# Patient Record
Sex: Male | Born: 1935
Health system: Southern US, Community
[De-identification: ages and names within clinical notes are randomized; demographics above are authoritative.]

## PROBLEM LIST (undated history)

## (undated) DIAGNOSIS — I6389 Other cerebral infarction: Secondary | ICD-10-CM

## (undated) DIAGNOSIS — F419 Anxiety disorder, unspecified: Secondary | ICD-10-CM

## (undated) DIAGNOSIS — I509 Heart failure, unspecified: Secondary | ICD-10-CM

## (undated) DIAGNOSIS — Z9289 Personal history of other medical treatment: Secondary | ICD-10-CM

## (undated) DIAGNOSIS — Z8719 Personal history of other diseases of the digestive system: Secondary | ICD-10-CM

## (undated) DIAGNOSIS — K5790 Diverticulosis of intestine, part unspecified, without perforation or abscess without bleeding: Secondary | ICD-10-CM

## (undated) DIAGNOSIS — K219 Gastro-esophageal reflux disease without esophagitis: Secondary | ICD-10-CM

## (undated) DIAGNOSIS — R001 Bradycardia, unspecified: Secondary | ICD-10-CM

## (undated) DIAGNOSIS — I619 Nontraumatic intracerebral hemorrhage, unspecified: Secondary | ICD-10-CM

## (undated) DIAGNOSIS — I2510 Atherosclerotic heart disease of native coronary artery without angina pectoris: Secondary | ICD-10-CM

## (undated) DIAGNOSIS — I219 Acute myocardial infarction, unspecified: Secondary | ICD-10-CM

## (undated) DIAGNOSIS — I639 Cerebral infarction, unspecified: Secondary | ICD-10-CM

## (undated) DIAGNOSIS — M1990 Unspecified osteoarthritis, unspecified site: Secondary | ICD-10-CM

## (undated) DIAGNOSIS — I4891 Unspecified atrial fibrillation: Secondary | ICD-10-CM

## (undated) DIAGNOSIS — F329 Major depressive disorder, single episode, unspecified: Secondary | ICD-10-CM

## (undated) DIAGNOSIS — F32A Depression, unspecified: Secondary | ICD-10-CM

## (undated) DIAGNOSIS — K449 Diaphragmatic hernia without obstruction or gangrene: Secondary | ICD-10-CM

## (undated) DIAGNOSIS — E785 Hyperlipidemia, unspecified: Secondary | ICD-10-CM

## (undated) DIAGNOSIS — I10 Essential (primary) hypertension: Secondary | ICD-10-CM

## (undated) HISTORY — DX: Hyperlipidemia, unspecified: E78.5

## (undated) HISTORY — PX: CATARACT EXTRACTION W/ INTRAOCULAR LENS  IMPLANT, BILATERAL: SHX1307

## (undated) HISTORY — DX: Anxiety disorder, unspecified: F41.9

## (undated) HISTORY — DX: Diverticulosis of intestine, part unspecified, without perforation or abscess without bleeding: K57.90

## (undated) HISTORY — DX: Personal history of other diseases of the digestive system: Z87.19

## (undated) HISTORY — DX: Diaphragmatic hernia without obstruction or gangrene: K44.9

## (undated) HISTORY — PX: TOTAL KNEE ARTHROPLASTY: SHX125

## (undated) HISTORY — DX: Acute myocardial infarction, unspecified: I21.9

## (undated) HISTORY — DX: Unspecified atrial fibrillation: I48.91

## (undated) HISTORY — DX: Essential (primary) hypertension: I10

## (undated) HISTORY — PX: CARDIAC CATHETERIZATION: SHX172

## (undated) HISTORY — PX: INGUINAL HERNIA REPAIR: SUR1180

## (undated) HISTORY — DX: Atherosclerotic heart disease of native coronary artery without angina pectoris: I25.10

## (undated) HISTORY — DX: Gastro-esophageal reflux disease without esophagitis: K21.9

## (undated) HISTORY — PX: TONSILLECTOMY: SUR1361

## (undated) HISTORY — DX: Cerebral infarction, unspecified: I63.9

## (undated) HISTORY — DX: Bradycardia, unspecified: R00.1

---

## 1970-02-08 HISTORY — PX: NASAL SEPTUM SURGERY: SHX37

## 1982-02-08 DIAGNOSIS — I219 Acute myocardial infarction, unspecified: Secondary | ICD-10-CM

## 1982-02-08 HISTORY — DX: Acute myocardial infarction, unspecified: I21.9

## 1982-02-08 HISTORY — PX: OTHER SURGICAL HISTORY: SHX169

## 1986-02-08 DIAGNOSIS — Z9289 Personal history of other medical treatment: Secondary | ICD-10-CM

## 1986-02-08 HISTORY — PX: VAGOTOMY AND PYLOROPLASTY: SHX831

## 1986-02-08 HISTORY — DX: Personal history of other medical treatment: Z92.89

## 1986-10-15 DIAGNOSIS — Z8719 Personal history of other diseases of the digestive system: Secondary | ICD-10-CM

## 1986-10-15 HISTORY — DX: Personal history of other diseases of the digestive system: Z87.19

## 1987-02-09 HISTORY — PX: CHOLECYSTECTOMY OPEN: SUR202

## 1995-03-16 DIAGNOSIS — K5790 Diverticulosis of intestine, part unspecified, without perforation or abscess without bleeding: Secondary | ICD-10-CM

## 1995-03-16 HISTORY — DX: Diverticulosis of intestine, part unspecified, without perforation or abscess without bleeding: K57.90

## 2000-09-20 ENCOUNTER — Ambulatory Visit (HOSPITAL_COMMUNITY): Admission: RE | Admit: 2000-09-20 | Discharge: 2000-09-20 | Payer: Self-pay | Admitting: *Deleted

## 2000-09-20 ENCOUNTER — Encounter: Payer: Self-pay | Admitting: *Deleted

## 2000-12-30 DIAGNOSIS — K449 Diaphragmatic hernia without obstruction or gangrene: Secondary | ICD-10-CM

## 2000-12-30 DIAGNOSIS — K219 Gastro-esophageal reflux disease without esophagitis: Secondary | ICD-10-CM

## 2000-12-30 HISTORY — DX: Gastro-esophageal reflux disease without esophagitis: K21.9

## 2000-12-30 HISTORY — DX: Diaphragmatic hernia without obstruction or gangrene: K44.9

## 2002-03-26 ENCOUNTER — Encounter: Payer: Self-pay | Admitting: Orthopedic Surgery

## 2002-04-02 ENCOUNTER — Inpatient Hospital Stay (HOSPITAL_COMMUNITY): Admission: RE | Admit: 2002-04-02 | Discharge: 2002-04-05 | Payer: Self-pay | Admitting: Orthopedic Surgery

## 2002-04-05 ENCOUNTER — Encounter: Payer: Self-pay | Admitting: *Deleted

## 2002-04-05 ENCOUNTER — Inpatient Hospital Stay (HOSPITAL_COMMUNITY): Admission: EM | Admit: 2002-04-05 | Discharge: 2002-04-06 | Payer: Self-pay | Admitting: Emergency Medicine

## 2002-04-29 ENCOUNTER — Observation Stay (HOSPITAL_COMMUNITY): Admission: EM | Admit: 2002-04-29 | Discharge: 2002-04-30 | Payer: Self-pay | Admitting: Emergency Medicine

## 2003-12-24 ENCOUNTER — Ambulatory Visit: Payer: Self-pay | Admitting: Cardiology

## 2004-01-14 ENCOUNTER — Ambulatory Visit: Payer: Self-pay | Admitting: Cardiology

## 2004-02-11 ENCOUNTER — Ambulatory Visit: Payer: Self-pay | Admitting: *Deleted

## 2004-02-27 ENCOUNTER — Ambulatory Visit: Payer: Self-pay | Admitting: *Deleted

## 2004-03-19 ENCOUNTER — Ambulatory Visit: Payer: Self-pay | Admitting: Internal Medicine

## 2004-04-08 ENCOUNTER — Ambulatory Visit: Payer: Self-pay

## 2004-04-16 ENCOUNTER — Ambulatory Visit: Payer: Self-pay | Admitting: *Deleted

## 2004-04-23 ENCOUNTER — Ambulatory Visit: Payer: Self-pay | Admitting: Family Medicine

## 2004-05-14 ENCOUNTER — Ambulatory Visit: Payer: Self-pay | Admitting: Internal Medicine

## 2004-06-11 ENCOUNTER — Ambulatory Visit: Payer: Self-pay | Admitting: Internal Medicine

## 2004-07-09 ENCOUNTER — Ambulatory Visit: Payer: Self-pay | Admitting: Cardiology

## 2004-08-06 ENCOUNTER — Ambulatory Visit: Payer: Self-pay | Admitting: Cardiology

## 2004-09-03 ENCOUNTER — Ambulatory Visit: Payer: Self-pay | Admitting: Cardiology

## 2004-09-14 ENCOUNTER — Ambulatory Visit: Payer: Self-pay | Admitting: Cardiology

## 2004-10-05 ENCOUNTER — Ambulatory Visit: Payer: Self-pay | Admitting: Cardiology

## 2004-11-02 ENCOUNTER — Ambulatory Visit: Payer: Self-pay | Admitting: Cardiology

## 2004-11-30 ENCOUNTER — Ambulatory Visit: Payer: Self-pay | Admitting: Internal Medicine

## 2004-12-28 ENCOUNTER — Ambulatory Visit: Payer: Self-pay | Admitting: Cardiology

## 2005-01-25 ENCOUNTER — Ambulatory Visit: Payer: Self-pay | Admitting: Internal Medicine

## 2005-02-15 ENCOUNTER — Ambulatory Visit: Payer: Self-pay | Admitting: Cardiology

## 2005-03-18 ENCOUNTER — Ambulatory Visit: Payer: Self-pay | Admitting: *Deleted

## 2005-04-13 ENCOUNTER — Ambulatory Visit: Payer: Self-pay | Admitting: Cardiology

## 2005-05-18 ENCOUNTER — Ambulatory Visit: Payer: Self-pay | Admitting: Cardiology

## 2005-05-21 ENCOUNTER — Ambulatory Visit: Payer: Self-pay | Admitting: Family Medicine

## 2005-06-01 ENCOUNTER — Ambulatory Visit: Payer: Self-pay | Admitting: Cardiology

## 2005-06-25 ENCOUNTER — Ambulatory Visit: Payer: Self-pay | Admitting: Internal Medicine

## 2005-06-29 ENCOUNTER — Ambulatory Visit: Payer: Self-pay | Admitting: *Deleted

## 2005-07-22 ENCOUNTER — Ambulatory Visit: Payer: Self-pay | Admitting: Cardiology

## 2005-08-12 ENCOUNTER — Ambulatory Visit: Payer: Self-pay | Admitting: Internal Medicine

## 2005-08-24 ENCOUNTER — Ambulatory Visit: Payer: Self-pay | Admitting: Cardiology

## 2005-09-14 ENCOUNTER — Ambulatory Visit: Payer: Self-pay | Admitting: Cardiology

## 2005-09-20 ENCOUNTER — Encounter: Admission: RE | Admit: 2005-09-20 | Discharge: 2005-09-20 | Payer: Self-pay | Admitting: Otolaryngology

## 2005-10-01 ENCOUNTER — Ambulatory Visit: Payer: Self-pay | Admitting: Cardiovascular Disease

## 2005-10-15 ENCOUNTER — Ambulatory Visit: Payer: Self-pay | Admitting: Internal Medicine

## 2005-11-02 ENCOUNTER — Ambulatory Visit: Payer: Self-pay | Admitting: Cardiovascular Disease

## 2005-11-29 ENCOUNTER — Ambulatory Visit: Payer: Self-pay | Admitting: Family Medicine

## 2005-11-30 ENCOUNTER — Ambulatory Visit: Payer: Self-pay | Admitting: Cardiology

## 2005-12-02 ENCOUNTER — Ambulatory Visit: Payer: Self-pay | Admitting: Internal Medicine

## 2005-12-28 ENCOUNTER — Ambulatory Visit: Payer: Self-pay | Admitting: Internal Medicine

## 2006-01-13 ENCOUNTER — Ambulatory Visit: Payer: Self-pay | Admitting: Internal Medicine

## 2006-01-25 ENCOUNTER — Ambulatory Visit: Payer: Self-pay | Admitting: Cardiology

## 2006-02-15 ENCOUNTER — Ambulatory Visit: Payer: Self-pay | Admitting: Cardiology

## 2006-03-17 ENCOUNTER — Ambulatory Visit: Payer: Self-pay | Admitting: Cardiology

## 2006-04-07 ENCOUNTER — Ambulatory Visit: Payer: Self-pay | Admitting: Cardiology

## 2006-04-21 ENCOUNTER — Ambulatory Visit: Payer: Self-pay | Admitting: Cardiology

## 2006-05-05 ENCOUNTER — Ambulatory Visit: Payer: Self-pay | Admitting: Cardiology

## 2006-06-01 ENCOUNTER — Ambulatory Visit: Payer: Self-pay | Admitting: Family Medicine

## 2006-06-01 LAB — CONVERTED CEMR LAB
ALT: 24 units/L (ref 0–40)
AST: 22 units/L (ref 0–37)
Albumin: 3.7 g/dL (ref 3.5–5.2)
Alkaline Phosphatase: 64 units/L (ref 39–117)
BUN: 18 mg/dL (ref 6–23)
Basophils Absolute: 0 10*3/uL (ref 0.0–0.1)
Basophils Relative: 0.1 % (ref 0.0–1.0)
Bilirubin, Direct: 0.1 mg/dL (ref 0.0–0.3)
CO2: 32 meq/L (ref 19–32)
Calcium: 9.3 mg/dL (ref 8.4–10.5)
Chloride: 108 meq/L (ref 96–112)
Cholesterol: 118 mg/dL (ref 0–200)
Creatinine, Ser: 1.1 mg/dL (ref 0.4–1.5)
Eosinophils Absolute: 0.4 10*3/uL (ref 0.0–0.6)
Eosinophils Relative: 5.7 % — ABNORMAL HIGH (ref 0.0–5.0)
GFR calc Af Amer: 84 mL/min
GFR calc non Af Amer: 70 mL/min
Glucose, Bld: 111 mg/dL — ABNORMAL HIGH (ref 70–99)
HCT: 43.7 % (ref 39.0–52.0)
HDL: 36.3 mg/dL — ABNORMAL LOW (ref >39.0)
Hemoglobin: 15 g/dL (ref 13.0–17.0)
Hgb A1c MFr Bld: 6.4 % — ABNORMAL HIGH (ref 4.6–6.0)
LDL Cholesterol: 67 mg/dL (ref 0–99)
Lymphocytes Relative: 15.4 % (ref 12.0–46.0)
MCHC: 34.3 g/dL (ref 30.0–36.0)
MCV: 89.7 fL (ref 78.0–100.0)
Monocytes Absolute: 0.6 10*3/uL (ref 0.2–0.7)
Monocytes Relative: 8.1 % (ref 3.0–11.0)
Neutro Abs: 5.3 10*3/uL (ref 1.4–7.7)
Neutrophils Relative %: 70.7 % (ref 43.0–77.0)
PSA: 4.82 ng/mL — ABNORMAL HIGH (ref 0.10–4.00)
Platelets: 222 10*3/uL (ref 150–400)
Potassium: 4.8 meq/L (ref 3.5–5.1)
RBC: 4.87 M/uL (ref 4.22–5.81)
RDW: 12.5 % (ref 11.5–14.6)
Sodium: 144 meq/L (ref 135–145)
TSH: 2.47 microintl units/mL (ref 0.35–5.50)
Total Bilirubin: 0.7 mg/dL (ref 0.3–1.2)
Total CHOL/HDL Ratio: 3.3
Total Protein: 6.8 g/dL (ref 6.0–8.3)
Triglycerides: 76 mg/dL (ref 0–149)
VLDL: 15 mg/dL (ref 0–40)
WBC: 7.4 10*3/uL (ref 4.5–10.5)

## 2006-06-02 ENCOUNTER — Ambulatory Visit: Payer: Self-pay | Admitting: Cardiology

## 2006-06-15 ENCOUNTER — Ambulatory Visit: Payer: Self-pay | Admitting: *Deleted

## 2006-06-17 ENCOUNTER — Ambulatory Visit: Payer: Self-pay | Admitting: Internal Medicine

## 2006-07-05 ENCOUNTER — Ambulatory Visit: Payer: Self-pay | Admitting: Cardiology

## 2006-07-12 ENCOUNTER — Ambulatory Visit: Payer: Self-pay | Admitting: Internal Medicine

## 2006-08-03 ENCOUNTER — Ambulatory Visit: Payer: Self-pay | Admitting: Internal Medicine

## 2006-08-29 ENCOUNTER — Ambulatory Visit: Payer: Self-pay | Admitting: Cardiovascular Disease

## 2006-09-29 ENCOUNTER — Ambulatory Visit: Payer: Self-pay | Admitting: Cardiology

## 2006-10-17 ENCOUNTER — Ambulatory Visit: Payer: Self-pay | Admitting: Internal Medicine

## 2006-10-27 ENCOUNTER — Ambulatory Visit: Payer: Self-pay | Admitting: Internal Medicine

## 2006-11-17 ENCOUNTER — Ambulatory Visit: Payer: Self-pay | Admitting: Cardiology

## 2006-12-15 ENCOUNTER — Ambulatory Visit: Payer: Self-pay | Admitting: Cardiology

## 2007-01-12 ENCOUNTER — Ambulatory Visit: Payer: Self-pay | Admitting: Cardiology

## 2007-02-08 ENCOUNTER — Ambulatory Visit: Payer: Self-pay | Admitting: Cardiovascular Disease

## 2007-03-07 ENCOUNTER — Ambulatory Visit: Payer: Self-pay | Admitting: Cardiology

## 2007-04-05 ENCOUNTER — Ambulatory Visit: Payer: Self-pay | Admitting: Cardiology

## 2007-04-17 ENCOUNTER — Ambulatory Visit: Payer: Self-pay | Admitting: Internal Medicine

## 2007-05-03 ENCOUNTER — Ambulatory Visit: Payer: Self-pay | Admitting: Cardiology

## 2007-05-22 ENCOUNTER — Ambulatory Visit: Payer: Self-pay | Admitting: Internal Medicine

## 2007-06-20 ENCOUNTER — Ambulatory Visit: Payer: Self-pay | Admitting: Family Medicine

## 2007-06-20 DIAGNOSIS — E785 Hyperlipidemia, unspecified: Secondary | ICD-10-CM | POA: Insufficient documentation

## 2007-06-20 DIAGNOSIS — J4530 Mild persistent asthma, uncomplicated: Secondary | ICD-10-CM

## 2007-06-20 DIAGNOSIS — J309 Allergic rhinitis, unspecified: Secondary | ICD-10-CM

## 2007-06-20 DIAGNOSIS — K219 Gastro-esophageal reflux disease without esophagitis: Secondary | ICD-10-CM

## 2007-06-20 DIAGNOSIS — F411 Generalized anxiety disorder: Secondary | ICD-10-CM

## 2007-06-20 DIAGNOSIS — I10 Essential (primary) hypertension: Secondary | ICD-10-CM | POA: Insufficient documentation

## 2007-06-20 LAB — CONVERTED CEMR LAB
ALT: 27 units/L (ref 0–53)
AST: 20 units/L (ref 0–37)
Albumin: 3.6 g/dL (ref 3.5–5.2)
Alkaline Phosphatase: 53 units/L (ref 39–117)
BUN: 13 mg/dL (ref 6–23)
Basophils Absolute: 0 10*3/uL (ref 0.0–0.1)
Basophils Relative: 0.1 % (ref 0.0–1.0)
Bilirubin Urine: NEGATIVE
Bilirubin, Direct: 0.1 mg/dL (ref 0.0–0.3)
Blood in Urine, dipstick: NEGATIVE
CO2: 31 meq/L (ref 19–32)
Calcium: 9 mg/dL (ref 8.4–10.5)
Chloride: 106 meq/L (ref 96–112)
Cholesterol: 104 mg/dL (ref 0–200)
Creatinine, Ser: 1.1 mg/dL (ref 0.4–1.5)
Digitoxin Lvl: 0.2 ng/mL — ABNORMAL LOW (ref 0.8–2.0)
Eosinophils Absolute: 0.6 10*3/uL (ref 0.0–0.7)
Eosinophils Relative: 7.3 % — ABNORMAL HIGH (ref 0.0–5.0)
GFR calc Af Amer: 84 mL/min
GFR calc non Af Amer: 69 mL/min
Glucose, Bld: 105 mg/dL — ABNORMAL HIGH (ref 70–99)
Glucose, Urine, Semiquant: NEGATIVE
HCT: 44 % (ref 39.0–52.0)
HDL: 25.1 mg/dL — ABNORMAL LOW (ref >39.0)
Hemoglobin: 14.7 g/dL (ref 13.0–17.0)
Hgb A1c MFr Bld: 6.2 % — ABNORMAL HIGH (ref 4.6–6.0)
LDL Cholesterol: 60 mg/dL (ref 0–99)
Lymphocytes Relative: 16.6 % (ref 12.0–46.0)
MCHC: 33.4 g/dL (ref 30.0–36.0)
MCV: 92.9 fL (ref 78.0–100.0)
Monocytes Absolute: 0.6 10*3/uL (ref 0.1–1.0)
Monocytes Relative: 8.3 % (ref 3.0–12.0)
Neutro Abs: 5.1 10*3/uL (ref 1.4–7.7)
Neutrophils Relative %: 67.7 % (ref 43.0–77.0)
Nitrite: NEGATIVE
PSA: 4.77 ng/mL — ABNORMAL HIGH (ref 0.10–4.00)
Platelets: 189 10*3/uL (ref 150–400)
Potassium: 4.2 meq/L (ref 3.5–5.1)
RBC: 4.73 M/uL (ref 4.22–5.81)
RDW: 12.8 % (ref 11.5–14.6)
Sodium: 140 meq/L (ref 135–145)
Specific Gravity, Urine: 1.015
TSH: 3.44 microintl units/mL (ref 0.35–5.50)
Total Bilirubin: 1 mg/dL (ref 0.3–1.2)
Total CHOL/HDL Ratio: 4.1
Total Protein: 6.6 g/dL (ref 6.0–8.3)
Triglycerides: 96 mg/dL (ref 0–149)
Urobilinogen, UA: 0.2
VLDL: 19 mg/dL (ref 0–40)
WBC Urine, dipstick: NEGATIVE
WBC: 7.6 10*3/uL (ref 4.5–10.5)
pH: 5.5

## 2007-06-21 ENCOUNTER — Ambulatory Visit: Payer: Self-pay | Admitting: Internal Medicine

## 2007-07-19 ENCOUNTER — Ambulatory Visit: Payer: Self-pay | Admitting: Cardiology

## 2007-07-24 ENCOUNTER — Ambulatory Visit: Payer: Self-pay | Admitting: Pulmonary Disease

## 2007-08-16 ENCOUNTER — Ambulatory Visit: Payer: Self-pay | Admitting: Cardiovascular Disease

## 2007-09-15 ENCOUNTER — Ambulatory Visit: Payer: Self-pay | Admitting: Cardiology

## 2007-09-20 ENCOUNTER — Ambulatory Visit: Payer: Self-pay | Admitting: Cardiology

## 2007-10-12 ENCOUNTER — Ambulatory Visit: Payer: Self-pay | Admitting: Cardiology

## 2007-11-15 ENCOUNTER — Ambulatory Visit: Payer: Self-pay | Admitting: Cardiology

## 2007-12-13 ENCOUNTER — Ambulatory Visit: Payer: Self-pay | Admitting: Cardiology

## 2008-01-10 ENCOUNTER — Ambulatory Visit: Payer: Self-pay | Admitting: Cardiology

## 2008-02-07 ENCOUNTER — Ambulatory Visit: Payer: Self-pay | Admitting: Cardiology

## 2008-02-09 HISTORY — PX: CORONARY ANGIOPLASTY WITH STENT PLACEMENT: SHX49

## 2008-03-06 ENCOUNTER — Ambulatory Visit: Payer: Self-pay | Admitting: Cardiovascular Disease

## 2008-04-03 ENCOUNTER — Ambulatory Visit: Payer: Self-pay | Admitting: Cardiology

## 2008-04-22 ENCOUNTER — Ambulatory Visit: Payer: Self-pay | Admitting: Internal Medicine

## 2008-04-22 ENCOUNTER — Encounter: Payer: Self-pay | Admitting: Internal Medicine

## 2008-05-01 ENCOUNTER — Ambulatory Visit: Payer: Self-pay | Admitting: Cardiology

## 2008-05-22 ENCOUNTER — Ambulatory Visit: Payer: Self-pay | Admitting: Cardiovascular Disease

## 2008-06-19 ENCOUNTER — Ambulatory Visit: Payer: Self-pay | Admitting: Cardiology

## 2008-06-21 ENCOUNTER — Ambulatory Visit: Payer: Self-pay | Admitting: Family Medicine

## 2008-06-21 LAB — CONVERTED CEMR LAB
ALT: 30 U/L
AST: 28 U/L
Albumin: 3.9 g/dL
Alkaline Phosphatase: 54 U/L
BUN: 15 mg/dL
Basophils Absolute: 0 K/uL
Basophils Relative: 0.1 %
Bilirubin Urine: NEGATIVE
Bilirubin, Direct: 0.1 mg/dL
CO2: 32 meq/L
Calcium: 9.3 mg/dL
Chloride: 108 meq/L
Cholesterol: 106 mg/dL
Creatinine, Ser: 1.1 mg/dL
Digitoxin Lvl: 0.3 ng/mL — ABNORMAL LOW
Eosinophils Absolute: 0.3 K/uL
Eosinophils Relative: 4.9 %
GFR calc non Af Amer: 68.99 mL/min
Glucose, Bld: 103 mg/dL — ABNORMAL HIGH
Glucose, Urine, Semiquant: NEGATIVE
HCT: 41.5 %
HDL: 31.3 mg/dL — ABNORMAL LOW
Hemoglobin: 14.2 g/dL
Ketones, urine, test strip: NEGATIVE
LDL Cholesterol: 61 mg/dL
Lymphocytes Relative: 17.4 %
Lymphs Abs: 1 K/uL
MCHC: 34.2 g/dL
MCV: 91.2 fL
Monocytes Absolute: 0.5 K/uL
Monocytes Relative: 8.8 %
Neutro Abs: 4 K/uL
Neutrophils Relative %: 68.8 %
Nitrite: NEGATIVE
PSA: 4.96 ng/mL — ABNORMAL HIGH
Platelets: 187 K/uL
Potassium: 4.8 meq/L
Protein, U semiquant: NEGATIVE
RBC: 4.55 M/uL
RDW: 12.7 %
Sodium: 141 meq/L
Specific Gravity, Urine: <1.005
TSH: 2.44 u[IU]/mL
Total Bilirubin: 0.8 mg/dL
Total CHOL/HDL Ratio: 3
Total Protein: 6.7 g/dL
Triglycerides: 69 mg/dL
Urobilinogen, UA: 0.2
VLDL: 13.8 mg/dL
WBC Urine, dipstick: NEGATIVE
WBC: 5.8 10*3/microliter
pH: 6

## 2008-07-09 ENCOUNTER — Encounter: Payer: Self-pay | Admitting: *Deleted

## 2008-07-17 ENCOUNTER — Ambulatory Visit: Payer: Self-pay | Admitting: Internal Medicine

## 2008-07-17 LAB — CONVERTED CEMR LAB
POC INR: 2
Protime: 17.6

## 2008-08-14 ENCOUNTER — Encounter: Payer: Self-pay | Admitting: *Deleted

## 2008-08-19 ENCOUNTER — Encounter (INDEPENDENT_AMBULATORY_CARE_PROVIDER_SITE_OTHER): Payer: Self-pay | Admitting: Cardiology

## 2008-08-19 ENCOUNTER — Ambulatory Visit: Payer: Self-pay | Admitting: Cardiovascular Disease

## 2008-08-19 LAB — CONVERTED CEMR LAB: Prothrombin Time: 18.1 s

## 2008-09-16 ENCOUNTER — Ambulatory Visit: Payer: Self-pay | Admitting: Internal Medicine

## 2008-09-16 LAB — CONVERTED CEMR LAB: Prothrombin Time: 23.6 s

## 2008-09-30 ENCOUNTER — Ambulatory Visit: Payer: Self-pay | Admitting: Cardiology

## 2008-10-21 ENCOUNTER — Ambulatory Visit: Payer: Self-pay | Admitting: Internal Medicine

## 2008-11-14 ENCOUNTER — Encounter: Payer: Self-pay | Admitting: Family Medicine

## 2008-11-18 ENCOUNTER — Ambulatory Visit: Payer: Self-pay | Admitting: Internal Medicine

## 2008-11-18 LAB — CONVERTED CEMR LAB: POC INR: 2.8

## 2008-12-12 ENCOUNTER — Encounter: Payer: Self-pay | Admitting: Cardiology

## 2008-12-12 ENCOUNTER — Telehealth (INDEPENDENT_AMBULATORY_CARE_PROVIDER_SITE_OTHER): Payer: Self-pay

## 2008-12-16 ENCOUNTER — Ambulatory Visit: Payer: Self-pay

## 2008-12-16 ENCOUNTER — Ambulatory Visit: Payer: Self-pay | Admitting: Cardiology

## 2008-12-16 ENCOUNTER — Encounter (HOSPITAL_COMMUNITY): Admission: RE | Admit: 2008-12-16 | Discharge: 2009-02-05 | Payer: Self-pay | Admitting: Cardiology

## 2008-12-16 DIAGNOSIS — I2510 Atherosclerotic heart disease of native coronary artery without angina pectoris: Secondary | ICD-10-CM

## 2008-12-16 DIAGNOSIS — Z9861 Coronary angioplasty status: Secondary | ICD-10-CM

## 2008-12-16 DIAGNOSIS — R072 Precordial pain: Secondary | ICD-10-CM

## 2008-12-16 LAB — CONVERTED CEMR LAB: POC INR: 2

## 2008-12-17 ENCOUNTER — Ambulatory Visit: Payer: Self-pay | Admitting: Cardiology

## 2008-12-17 ENCOUNTER — Encounter (INDEPENDENT_AMBULATORY_CARE_PROVIDER_SITE_OTHER): Payer: Self-pay | Admitting: *Deleted

## 2008-12-19 ENCOUNTER — Encounter: Payer: Self-pay | Admitting: Cardiology

## 2008-12-23 ENCOUNTER — Ambulatory Visit: Payer: Self-pay | Admitting: Cardiology

## 2008-12-23 LAB — CONVERTED CEMR LAB
Basophils Relative: 0.1 % (ref 0.0–3.0)
CO2: 31 meq/L (ref 19–32)
Chloride: 104 meq/L (ref 96–112)
Creatinine, Ser: 1.1 mg/dL (ref 0.4–1.5)
Eosinophils Absolute: 0.4 10*3/uL (ref 0.0–0.7)
Eosinophils Relative: 4 % (ref 0.0–5.0)
HCT: 40.7 % (ref 39.0–52.0)
Hemoglobin: 14 g/dL (ref 13.0–17.0)
Lymphs Abs: 1.1 10*3/uL (ref 0.7–4.0)
MCHC: 34.4 g/dL (ref 30.0–36.0)
MCV: 94.6 fL (ref 78.0–100.0)
Monocytes Absolute: 0.9 10*3/uL (ref 0.1–1.0)
Neutro Abs: 7.6 10*3/uL (ref 1.4–7.7)
Potassium: 5 meq/L (ref 3.5–5.1)
RBC: 4.3 M/uL (ref 4.22–5.81)
Sodium: 140 meq/L (ref 135–145)
WBC: 10 10*3/uL (ref 4.5–10.5)

## 2008-12-25 ENCOUNTER — Inpatient Hospital Stay (HOSPITAL_BASED_OUTPATIENT_CLINIC_OR_DEPARTMENT_OTHER): Admission: RE | Admit: 2008-12-25 | Discharge: 2008-12-25 | Payer: Self-pay | Admitting: Cardiology

## 2008-12-25 ENCOUNTER — Ambulatory Visit: Payer: Self-pay | Admitting: Cardiology

## 2008-12-27 ENCOUNTER — Ambulatory Visit: Payer: Self-pay | Admitting: Cardiology

## 2008-12-27 ENCOUNTER — Inpatient Hospital Stay (HOSPITAL_COMMUNITY): Admission: AD | Admit: 2008-12-27 | Discharge: 2008-12-28 | Payer: Self-pay | Admitting: Cardiology

## 2009-01-10 ENCOUNTER — Ambulatory Visit: Payer: Self-pay | Admitting: Cardiology

## 2009-01-13 ENCOUNTER — Encounter (INDEPENDENT_AMBULATORY_CARE_PROVIDER_SITE_OTHER): Payer: Self-pay | Admitting: Cardiology

## 2009-01-13 ENCOUNTER — Ambulatory Visit: Payer: Self-pay | Admitting: Cardiology

## 2009-01-27 ENCOUNTER — Ambulatory Visit: Payer: Self-pay | Admitting: Internal Medicine

## 2009-01-27 LAB — CONVERTED CEMR LAB: POC INR: 2

## 2009-02-05 ENCOUNTER — Telehealth: Payer: Self-pay | Admitting: Gastroenterology

## 2009-02-20 ENCOUNTER — Telehealth: Payer: Self-pay | Admitting: Gastroenterology

## 2009-02-24 ENCOUNTER — Ambulatory Visit: Payer: Self-pay | Admitting: Cardiology

## 2009-02-24 LAB — CONVERTED CEMR LAB: POC INR: 2.7

## 2009-03-05 ENCOUNTER — Ambulatory Visit: Payer: Self-pay

## 2009-03-05 ENCOUNTER — Ambulatory Visit: Payer: Self-pay | Admitting: Cardiology

## 2009-03-24 ENCOUNTER — Encounter (INDEPENDENT_AMBULATORY_CARE_PROVIDER_SITE_OTHER): Payer: Self-pay | Admitting: Cardiology

## 2009-03-24 ENCOUNTER — Ambulatory Visit: Payer: Self-pay | Admitting: Internal Medicine

## 2009-03-24 LAB — CONVERTED CEMR LAB: POC INR: 2.5

## 2009-04-21 ENCOUNTER — Ambulatory Visit: Payer: Self-pay | Admitting: Cardiology

## 2009-04-21 ENCOUNTER — Encounter (INDEPENDENT_AMBULATORY_CARE_PROVIDER_SITE_OTHER): Payer: Self-pay | Admitting: Cardiology

## 2009-05-19 ENCOUNTER — Ambulatory Visit: Payer: Self-pay | Admitting: Internal Medicine

## 2009-06-16 ENCOUNTER — Ambulatory Visit: Payer: Self-pay | Admitting: Internal Medicine

## 2009-07-14 ENCOUNTER — Ambulatory Visit: Payer: Self-pay | Admitting: Internal Medicine

## 2009-07-14 LAB — CONVERTED CEMR LAB: POC INR: 2.7

## 2009-07-18 ENCOUNTER — Ambulatory Visit: Payer: Self-pay | Admitting: Family Medicine

## 2009-07-18 LAB — CONVERTED CEMR LAB
Alkaline Phosphatase: 71 units/L (ref 39–117)
Basophils Absolute: 0 10*3/uL (ref 0.0–0.1)
Bilirubin, Direct: 0.1 mg/dL (ref 0.0–0.3)
CO2: 32 meq/L (ref 19–32)
Calcium: 9.4 mg/dL (ref 8.4–10.5)
Creatinine, Ser: 1.1 mg/dL (ref 0.4–1.5)
Eosinophils Absolute: 0.3 10*3/uL (ref 0.0–0.7)
Glucose, Bld: 100 mg/dL — ABNORMAL HIGH (ref 70–99)
HDL: 34.6 mg/dL — ABNORMAL LOW (ref >39.00)
Ketones, urine, test strip: NEGATIVE
Lymphocytes Relative: 19.1 % (ref 12.0–46.0)
MCHC: 34.2 g/dL (ref 30.0–36.0)
Neutrophils Relative %: 67 % (ref 43.0–77.0)
Nitrite: NEGATIVE
PSA: 5.9 ng/mL — ABNORMAL HIGH (ref 0.10–4.00)
Protein, U semiquant: NEGATIVE
RBC: 4.72 M/uL (ref 4.22–5.81)
RDW: 13.6 % (ref 11.5–14.6)
Specific Gravity, Urine: 1.015
Total CHOL/HDL Ratio: 4
Triglycerides: 125 mg/dL (ref 0.0–149.0)
VLDL: 25 mg/dL (ref 0.0–40.0)

## 2009-08-05 ENCOUNTER — Ambulatory Visit: Payer: Self-pay | Admitting: Cardiology

## 2009-08-20 ENCOUNTER — Ambulatory Visit: Payer: Self-pay | Admitting: Cardiology

## 2009-08-20 LAB — CONVERTED CEMR LAB
Chloride: 107 meq/L (ref 96–112)
Creatinine, Ser: 1 mg/dL (ref 0.4–1.5)
POC INR: 2.4
Potassium: 4.6 meq/L (ref 3.5–5.1)

## 2009-09-22 ENCOUNTER — Ambulatory Visit: Payer: Self-pay | Admitting: Cardiology

## 2009-09-22 LAB — CONVERTED CEMR LAB: POC INR: 2

## 2009-10-20 ENCOUNTER — Ambulatory Visit: Payer: Self-pay | Admitting: Cardiovascular Disease

## 2009-10-20 LAB — CONVERTED CEMR LAB: POC INR: 2.4

## 2009-10-30 ENCOUNTER — Encounter: Payer: Self-pay | Admitting: Family Medicine

## 2009-11-17 ENCOUNTER — Ambulatory Visit: Payer: Self-pay | Admitting: Internal Medicine

## 2009-12-29 ENCOUNTER — Ambulatory Visit: Payer: Self-pay | Admitting: Cardiovascular Disease

## 2009-12-29 LAB — CONVERTED CEMR LAB: INR: 2.5

## 2010-01-19 ENCOUNTER — Encounter: Payer: Self-pay | Admitting: Cardiology

## 2010-01-19 ENCOUNTER — Ambulatory Visit: Payer: Self-pay | Admitting: Cardiology

## 2010-01-26 ENCOUNTER — Ambulatory Visit: Payer: Self-pay | Admitting: Cardiology

## 2010-01-26 LAB — CONVERTED CEMR LAB: POC INR: 2.3

## 2010-02-23 ENCOUNTER — Ambulatory Visit: Admission: RE | Admit: 2010-02-23 | Discharge: 2010-02-23 | Payer: Self-pay | Source: Home / Self Care

## 2010-02-23 LAB — CONVERTED CEMR LAB: POC INR: 2.2

## 2010-03-10 NOTE — Medication Information (Signed)
Summary: rov/sp  Anticoagulant Therapy  Managed by: Reina Fuse, PharmD Referring MD: Charlies Constable, MD PCP: [name] MD Supervising MD: Excell Seltzer MD, Casimiro Needle Indication 1: Atrial Fibrillation (ICD-427.31) Lab Used: [facility]  Site: [address] INR RANGE 2 - 3  Dietary changes: no    Health status changes: no    Bleeding/hemorrhagic complications: no    Recent/future hospitalizations: no    Any changes in medication regimen? no    Recent/future dental: no  Any missed doses?: no       Is patient compliant with meds? yes       Allergies: No Known Drug Allergies  Anticoagulation Management History:      Positive risk factors for bleeding include an age of 25 years or older.  The bleeding index is 'intermediate risk'.  Positive CHADS2 values include History of HTN and Age > 17 years old.  The start date was 05/03/2002.  His last INR was 1.7 ratio and today's INR is 2.5.  Anticoagulation responsible provider: Excell Seltzer MD, Casimiro Needle.  Cuvette Lot#: [id number].  Exp: 01/2011.    Anticoagulation Management Assessment/Plan:      The patient's current anticoagulation dose is Warfarin sodium 5 mg tabs: take one tab once daily as directed.  The target INR is 2 - 3.  The next INR is due 01/26/2010.  Anticoagulation instructions were given to patient.  Results were reviewed/authorized by Reina Fuse, PharmD.         Prior Anticoagulation Instructions: INR 2.4  Continue taking Coumadin 2.5 mg (0.5 tab) on Mon, Wed, Fri and Coumadin 5 mg (1 tab) on Sun, Tues, Thur, Sat.  Return to clinic in 4 weeks.   Current Anticoagulation Instructions: INR 2.5 Continue current dose: 1/2 tablet (2.5 mg) on Monday, Wednesday, Fridays and 1 tablet (5mg ) the rest of the days. Recheck INR in 4 weeks.

## 2010-03-10 NOTE — Letter (Signed)
Summary: Handout Printed  Printed Handout:  - Coumadin Instructions-w/out Meds 

## 2010-03-10 NOTE — Miscellaneous (Signed)
Summary: flu vaccine  Clinical Lists Changes  Observations: Added new observation of FLU VAX: Historical (10/29/2009 15:48)      Immunization History:  Influenza Immunization History:    Influenza:  historical (10/29/2009)

## 2010-03-10 NOTE — Medication Information (Signed)
Summary: rov/sp  Anticoagulant Therapy  Managed by: Weston Brass, PharmD Referring MD: Charlies Constable, MD PCP: [name] MD Supervising MD: Shirlee Latch MD, Malaak Stach Indication 1: Atrial Fibrillation (ICD-427.31) Lab Used: [facility] [place] Site: [address] INR POC 2.4 INR RANGE 2 - 3  Dietary changes: no    Health status changes: no    Bleeding/hemorrhagic complications: no    Recent/future hospitalizations: no    Any changes in medication regimen? no    Recent/future dental: no  Any missed doses?: no       Is patient compliant with meds? yes      Comments: pt will be in [place] at 4 week appt time, so scheduled appt for 8/15  Allergies: No Known Drug Allergies  Anticoagulation Management History:      The patient is taking warfarin and comes in today for a routine follow up visit.  Positive risk factors for bleeding include an age of 75 years or older.  The bleeding index is 'intermediate risk'.  Positive CHADS2 values include History of HTN and Age > 39 years old.  The start date was 05/03/2002.  His last INR was 1.7 ratio.  Anticoagulation responsible provider: Shirlee Latch MD, Wray Goehring.  INR POC: 2.4.  Cuvette Lot#: [id number].  Exp: 10/2010.    Anticoagulation Management Assessment/Plan:      The patient's current anticoagulation dose is Warfarin sodium 5 mg tabs: take one tab once daily as directed.  The target INR is 2 - 3.  The next INR is due 09/22/2009.  Anticoagulation instructions were given to patient.  Results were reviewed/authorized by Weston Brass, PharmD.  He was notified by Dillard Cannon.         Prior Anticoagulation Instructions: INR 2.7  Continue same dose of 1 tablet every day except 1/2 tablet on Monday, Wednesday and Friday  Current Anticoagulation Instructions: INR 2.4  Continue same regimen of 1 tab on Sunday, Tuesday, Thursday, and Saturday and 1/2 tab on Monday, Wednesday, and Friday.  Re-check INR in 4 weeks.

## 2010-03-10 NOTE — Medication Information (Signed)
Summary: [email or web address]  Anticoagulant Therapy  Managed by: Cloyde Reams, RN, BSN Referring MD: Charlies Constable, MD PCP: [name] MD Supervising MD: Tenny Craw MD, Gunnar Fusi Indication 1: Atrial Fibrillation (ICD-427.31) Lab Used: [facility] [place] Site: [address] INR POC 2.3 INR RANGE 2 - 3  Dietary changes: yes       Details: Incr salads last few days.    Health status changes: no    Bleeding/hemorrhagic complications: no    Recent/future hospitalizations: no    Any changes in medication regimen? no    Recent/future dental: no  Any missed doses?: no       Is patient compliant with meds? yes       Allergies (verified): No Known Drug Allergies  Anticoagulation Management History:      The patient is taking warfarin and comes in today for a routine follow up visit.  Positive risk factors for bleeding include an age of 75 years or older.  The bleeding index is 'intermediate risk'.  Positive CHADS2 values include History of HTN and Age > 58 years old.  The start date was 05/03/2002.  His last INR was 1.7 ratio.  Anticoagulation responsible provider: Tenny Craw MD, Gunnar Fusi.  INR POC: 2.3.  Cuvette Lot#: [id number].  Exp: 06/2010.    Anticoagulation Management Assessment/Plan:      The patient's current anticoagulation dose is Warfarin sodium 5 mg tabs: take one tab once daily as directed.  The target INR is 2 - 3.  The next INR is due 06/16/2009.  Anticoagulation instructions were given to patient.  Results were reviewed/authorized by Cloyde Reams, RN, BSN.  He was notified by Cloyde Reams RN.         Prior Anticoagulation Instructions: INR 2.0  Take 1 tab today, then resume 1 tab daily except 0.5 tab on  Monday, Wednesday, Friday.  Recheck in 4 weeks.    Current Anticoagulation Instructions: INR 2.3  Continue on same dosage 1 tablet daily except 1/2 tablet on Mondays, Wednesdays, and Fridays.  Recheck in 4 weeks.

## 2010-03-10 NOTE — Progress Notes (Signed)
Summary: Req Dr. Jarold Motto call  Phone Note Call from Patient   Caller: Patient Summary of Call: Pt asks that Dr. Jarold Motto call him.  Having some dairrhea and bloating.  Also has a personal matter that he needs to discuss with Dr. Jarold Motto. Pt can be reached at [phone]. Initial call taken by: Ashok Cordia RN,  February 20, 2009 10:04 AM  Follow-up for Phone Call        done Follow-up by: Mardella Layman MD FACG,  February 21, 2009 12:45 PM

## 2010-03-10 NOTE — Medication Information (Signed)
Summary: [email or web address]  Anticoagulant Therapy  Managed by: Shelby Dubin, PharmD Referring MD: Charlies Constable, MD PCP: [name] MD Supervising MD: Gala Romney MD, Reuel Boom Indication 1: Atrial Fibrillation (ICD-427.31) Lab Used: [facility] [place] Site: [address] INR POC 2.5 INR RANGE 2 - 3  Dietary changes: no    Health status changes: no    Bleeding/hemorrhagic complications: no    Recent/future hospitalizations: no    Any changes in medication regimen? no    Recent/future dental: no  Any missed doses?: no       Is patient compliant with meds? yes       Allergies (verified): No Known Drug Allergies  Anticoagulation Management History:      The patient is taking warfarin and comes in today for a routine follow up visit.  Positive risk factors for bleeding include an age of 75 years or older.  The bleeding index is 'intermediate risk'.  Positive CHADS2 values include History of HTN and Age > 75 years old.  The start date was 05/03/2002.  His last INR was 1.7 ratio.  Anticoagulation responsible provider: Pierce Biagini MD, Reuel Boom.  INR POC: 2.5.  Cuvette Lot#: [id number].  Exp: 05/2010.    Anticoagulation Management Assessment/Plan:      The patient's current anticoagulation dose is Warfarin sodium 5 mg tabs: take one tab once daily as directed.  The target INR is 2 - 3.  The next INR is due 04/21/2009.  Anticoagulation instructions were given to patient.  Results were reviewed/authorized by Shelby Dubin, PharmD.  He was notified by Shelby Dubin PharmD, BCPS, CPP.         Prior Anticoagulation Instructions: INR 2.7  Continue same dose of 1 tablet daily except 0.5 tablet on Mondays, Wednesdays, and Fridays. Recheck in 4 weeks.  Current Anticoagulation Instructions: INR 2.5  Continue 0.5 tab Monday, Wednesday, Friday and 1 tab on other days.  Recheck in 4 weeks.

## 2010-03-10 NOTE — Medication Information (Signed)
Summary: rov/tm  Anticoagulant Therapy  Managed by: Cloyde Reams, RN, BSN Referring MD: Charlies Constable, MD PCP: [name] MD Supervising MD: Eden Emms MD, Theron Arista Indication 1: Atrial Fibrillation (ICD-427.31) Lab Used: [facility] [place] Site: [address] INR POC 2.4 INR RANGE 2 - 3  Dietary changes: no    Health status changes: no    Bleeding/hemorrhagic complications: no    Recent/future hospitalizations: no    Any changes in medication regimen? no    Recent/future dental: no  Any missed doses?: no       Is patient compliant with meds? yes       Allergies: No Known Drug Allergies  Anticoagulation Management History:      The patient is taking warfarin and comes in today for a routine follow up visit.  Positive risk factors for bleeding include an age of 75 years or older.  The bleeding index is 'intermediate risk'.  Positive CHADS2 values include History of HTN and Age > 75 years old.  The start date was 05/03/2002.  His last INR was 1.7 ratio.  Anticoagulation responsible provider: Eden Emms MD, Theron Arista.  INR POC: 2.4.  Cuvette Lot#: [id number].  Exp: 12/2010.    Anticoagulation Management Assessment/Plan:      The patient's current anticoagulation dose is Warfarin sodium 5 mg tabs: take one tab once daily as directed.  The target INR is 2 - 3.  The next INR is due 11/17/2009.  Anticoagulation instructions were given to patient.  Results were reviewed/authorized by Cloyde Reams, RN, BSN.  He was notified by Harrel Carina, PharmD candidate.         Prior Anticoagulation Instructions: INR 2.0 Today take 5mg s then resume 5mg s everyday except 2.5mg s on Mondays, Wednesdays and Fridays. Recheck in 4 weeks.   Current Anticoagulation Instructions: INR 2.4  Continue taking 1 tablet everyday except take 1/2 tablet on Monday, Wednesday, and Friday. Re-check INR in 4 weeks.

## 2010-03-10 NOTE — Medication Information (Signed)
Summary: rov/tm  Anticoagulant Therapy  Managed by: Shelby Dubin, PharmD Referring MD: Charlies Constable, MD PCP: [name] MD Supervising MD: Jens Som MD, Arlys John Indication 1: Atrial Fibrillation (ICD-427.31) Lab Used: [facility] [place] Site: [address] INR POC 2.7 INR RANGE 2 - 3  Dietary changes: no    Health status changes: no    Bleeding/hemorrhagic complications: no    Recent/future hospitalizations: no    Any changes in medication regimen? yes       Details: Took a 3-day course of Xifaxan.  Recent/future dental: no  Any missed doses?: no       Is patient compliant with meds? yes       Allergies: No Known Drug Allergies  Anticoagulation Management History:      The patient is taking warfarin and comes in today for a routine follow up visit.  Positive risk factors for bleeding include an age of 8 years or older.  The bleeding index is 'intermediate risk'.  Positive CHADS2 values include History of HTN and Age > 76 years old.  The start date was 05/03/2002.  His last INR was 1.7 ratio.  Anticoagulation responsible provider: Jens Som MD, Arlys John.  INR POC: 2.7.  Cuvette Lot#: [id number].  Exp: 05/2010.    Anticoagulation Management Assessment/Plan:      The patient's current anticoagulation dose is Warfarin sodium 5 mg tabs: take one tab once daily as directed.  The target INR is 2 - 3.  The next INR is due 03/24/2009.  Anticoagulation instructions were given to patient.  Results were reviewed/authorized by Shelby Dubin, PharmD.  He was notified by Lew Dawes, PharmD Candidate.         Prior Anticoagulation Instructions: INR 2.0 Continue 5mg s everyday except 2.5mg s on Mondays, Wednesdays and Fridays. Recheck in 4 weeks.   Current Anticoagulation Instructions: INR 2.7  Continue same dose of 1 tablet daily except 0.5 tablet on Mondays, Wednesdays, and Fridays. Recheck in 4 weeks.

## 2010-03-10 NOTE — Cardiovascular Report (Signed)
Summary: Pre-Cath Orders  Pre-Cath Orders   Imported By: Marylou Mccoy 02/20/2009 14:03:21  _____________________________________________________________________  External Attachment:    Type:   Image     Comment:   External Document

## 2010-03-10 NOTE — Miscellaneous (Signed)
Summary: Pt Instructions- post GXT  Clinical Lists Changes  Medications: Changed medication from LANOXIN 0.125 MG  TABS (DIGOXIN) Take 1 tablet by mouth every morning to LANOXIN 0.125 MG  TABS (DIGOXIN) Take 1 tab by mouth every other day alternating with 2 tabs by mouth every other day. Removed medication of ASPIRIN 81 MG TBEC (ASPIRIN) Take one tablet by mouth daily Observations: Added new observation of [facility] CARDIO: Your physician has recommended you make the following change in your medication: 1) Increase lanoxin (digoxin) to 0.125mg  one tab by mouth every other day alternating with 2 tabs by mouth every other day. 2) Stop aspirin. Your physician wants you to follow-up in: 6 months.  You will receive a reminder letter in the mail two months in advance. If you don't receive a letter, please call our office to schedule the follow-up appointment. Dr. Juanda Chance would like you to come in for labwork to check a digoxin level on you when you return to town. He would like this to be done  one morning. Please call me when you come back in town and we will schedule this for you.  (03/05/2009 12:15)      Patient Instructions: 1)  Your physician has recommended you make the following change in your medication: 1) Increase lanoxin (digoxin) to 0.125mg  one tab by mouth every other day alternating with 2 tabs by mouth every other day. 2) Stop aspirin. 2)  Your physician wants you to follow-up in: 6 months.  You will receive a reminder letter in the mail two months in advance. If you don't receive a letter, please call our office to schedule the follow-up appointment. 3)  Dr. Juanda Chance would like you to come in for labwork to check a digoxin level on you when you return to town. He would like this to be done  one morning. Please call me when you come back in town and we will schedule this for you.

## 2010-03-10 NOTE — Medication Information (Signed)
Summary: [email or web address]  Anticoagulant Therapy  Managed by: Shelby Dubin, PharmD, BCPS, CPP Referring MD: Charlies Constable, MD PCP: [name] MD Supervising MD: Myrtis Ser MD, Tinnie Gens Indication 1: Atrial Fibrillation (ICD-427.31) Lab Used: [facility] [place] Site: [address] INR POC 2.0 INR RANGE 2 - 3  Dietary changes: yes       Details: may not have consumed as many greens  Health status changes: no    Bleeding/hemorrhagic complications: no    Recent/future hospitalizations: no    Any changes in medication regimen? no    Recent/future dental: no  Any missed doses?: no       Is patient compliant with meds? yes       Allergies (verified): No Known Drug Allergies  Anticoagulation Management History:      The patient is taking warfarin and comes in today for a routine follow up visit.  Positive risk factors for bleeding include an age of 75 years or older.  The bleeding index is 'intermediate risk'.  Positive CHADS2 values include History of HTN and Age > 61 years old.  The start date was 05/03/2002.  His last INR was 1.7 ratio.  Anticoagulation responsible provider: Myrtis Ser MD, Tinnie Gens.  INR POC: 2.0.  Cuvette Lot#: [id number].  Exp: 06/2010.    Anticoagulation Management Assessment/Plan:      The patient's current anticoagulation dose is Warfarin sodium 5 mg tabs: take one tab once daily as directed.  The target INR is 2 - 3.  The next INR is due 05/19/2009.  Anticoagulation instructions were given to patient.  Results were reviewed/authorized by Shelby Dubin, PharmD, BCPS, CPP.  He was notified by Shelby Dubin PharmD, BCPS, CPP.         Prior Anticoagulation Instructions: INR 2.5  Continue 0.5 tab Monday, Wednesday, Friday and 1 tab on other days.  Recheck in 4 weeks.   Current Anticoagulation Instructions: INR 2.0  Take 1 tab today, then resume 1 tab daily except 0.5 tab on  Monday, Wednesday, Friday.  Recheck in 4 weeks.

## 2010-03-10 NOTE — Assessment & Plan Note (Signed)
Summary: pt will come in fasting/njr---PT [facility] (BMP) // RS   Vital Signs:  Patient profile:   77 year old male Height:      69.75 inches Weight:      168 pounds Temp:     97.9 degrees F oral BP sitting:   142 / 82  (left arm) Cuff size:   regular  Vitals Entered By: Kern Reap CMA Duncan Dull) (July 18, 2009 10:28 AM) CC: cpx   Primary Care Provider:  [name] MD  CC:  cpx.  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    He has a history of underlying mild anxiety for which he takes Wellbutrin 150 mg long-acting daily.  Feels well.  He also takes Prilosec 20 mg daily for reflux.  No symptoms on medication.  For atrial fibrillation.  He takes a beta blocker 50 mg daily, Lanoxin .25 mg daily...Marland KitchenMarland KitchenMarland Kitchen will check blood level. he has had no AF  for 6 months. For BPH.  He takes Flomax .4 daily however, his ophthalmologist, Dr. Dagoberto Ligas would like him to decrease the Flomax or discontinue it because she feels it is affecting his eyes.  He also takes Lipitor 10 mg nightly for hyperlipidemia, which a lipid panel.  He has no angina.  Last fall.  He had a stent placed.  He takes Coumadin 5 mg daily and Plavix 75 mg daily.  I've asked him to ask his cardiologist, Dr. Juanda Chance  if he can decrease the Plavix to Monday, Wednesday, Friday, because of the severe bruising.  He will discuss this with Dr. Juanda Chance  pro times at the cardiology office. Here for Medicare AWV:  1.   Risk factors based on Past M, S, F history:.......Marland Kitchenreviewed.  No change 2.   Physical Activities: ............plays golf regularly 3.   Depression/mood...........mood good.  No depression:  4.   Hearing: .........Marland Kitchenhearing aid left ear 5.   ADL's: ..........normal 6.   Fall Risk: .........none 7.   Home Safety: ........Marland Kitchen review 8.   Height, weight, &visual acuity........Marland Kitchenheight weight, normal annual eye exam by ophthalmologist: 9.    Counseling: ..........continue exercise in good health habits 10.   Labs ordered based on risk factors: ............done 11.           Referral Coordination...........n/a 12.           Care Plan........Marland Kitchen reviewed and will call laboratory results 13.            Cognitive Assessment .......normal tetanus 2009 seasonal flu 2010 Pneumovax 2007 shingles 2009  Allergies: No Known Drug Allergies  Past History:  Past medical, surgical, family and social histories (including risk factors) reviewed, and no changes noted (except as noted below).  Past Medical History: Reviewed history from 04/22/2008 and no changes required. CAD Sinus Bradycardia Anxiety Asthma Atrial fibrillation GERD Hyperlipidemia Hypertension Allergic rhinitis  Past Surgical History: ENT surgery, 1972 1984, hospital CCU, heart attack, hypoplastic right coronary 1985, GI bleed, abdominal surgery pyloroplasty 1989, cholecystectomy right and left total knee replacements stent fall 2010  Family History: Reviewed history from 07/25/2007 and no changes required. Mother-deceased age 48; CAD Father- Deceased age 35; auto accident Brother- living age 65; CAD Sister-living age 25 Sister-living age 67; DVT   Social History: Reviewed history from 06/20/2007 and no changes required. Occupation: Married Never Smoked Alcohol use-no Drug use-no Regular exercise-yes  Review of Systems      See HPI  Physical Exam  General:  Well-developed,well-nourished,in no acute distress; alert,appropriate and cooperative throughout examination Head:  Normocephalic and atraumatic without obvious abnormalities. No apparent alopecia or balding. Eyes:  No corneal or conjunctival inflammation noted. EOMI. Perrla. Funduscopic exam benign, without hemorrhages, exudates or papilledema. Vision grossly [employer].no bilateral cataracts Ears:  External ear exam shows no significant lesions or deformities.  Otoscopic examination reveals clear  canals, tympanic membranes are intact bilaterally without bulging, retraction, inflammation or discharge. Hearing is grossly normal bilaterally. Nose:  External nasal examination shows no deformity or inflammation. Nasal mucosa are pink and moist without lesions or exudates. Mouth:  Oral mucosa and oropharynx without lesions or exudates.  Teeth in good repair. Neck:  No deformities, masses, or tenderness noted. Chest Wall:  No deformities, masses, tenderness or gynecomastia noted. Breasts:  No masses or gynecomastia noted Lungs:  Normal respiratory effort, chest expands symmetrically. Lungs are clear to auscultation, no crackles or wheezes. Heart:  Normal rate and regular rhythm. S1 and S2 normal without gallop, murmur, click, rub or other extra sounds. Abdomen:  Bowel sounds positive,abdomen soft and non-tender without masses, organomegaly or hernias noted. Msk:  No deformity or scoliosis noted of thoracic or lumbar spine.   Pulses:  R and L carotid,radial,femoral,dorsalis pedis and posterior tibial pulses are full and equal bilaterally Extremities:  No clubbing, cyanosis, edema, or deformity noted with normal full range of motion of all joints.   Neurologic:  No cranial nerve deficits noted. Station and gait are normal. Plantar reflexes are down-going bilaterally. DTRs are symmetrical throughout. Sensory, motor and coordinative functions appear intact. Skin:  Intact without suspicious lesions or rashes.......multiple bruising over the abdomen &  anterior thighs Cervical Nodes:  No lymphadenopathy noted Axillary Nodes:  No palpable lymphadenopathy Inguinal Nodes:  No significant adenopathy Psych:  Cognition and judgment appear intact. Alert and cooperative with normal attention span and concentration. No apparent delusions, illusions, hallucinations   Impression & Recommendations:  Problem # 1:  Preventive Health Care (ICD-V70.0) Assessment Unchanged  Problem # 2:  HYPERTENSION  (ICD-401.9) Assessment: Improved  His updated medication list for this problem includes:    Atenolol 50 Mg Tabs (Atenolol) ..... Once daily  Orders: Venipuncture (04540) Prescription Created Electronically [phone]) First annual wellness visit with prevention plan  (J4782) UA Dipstick w/o Micro (automated)  (81003) TLB-Lipid Panel (80061-LIPID) TLB-BMP (Basic Metabolic Panel-BMET) (80048-METABOL) TLB-CBC Platelet - w/Differential (85025-CBCD) TLB-Hepatic/Liver Function Pnl (80076-HEPATIC) TLB-TSH (Thyroid Stimulating Hormone) (84443-TSH) TLB-Digoxin (Lanoxin) (80162-DIG) TLB-PSA (Prostate Specific Antigen) (84153-PSA)  Problem # 3:  HYPERLIPIDEMIA (ICD-272.4) Assessment: Improved  His updated medication list for this problem includes:    Lipitor 10 Mg Tabs (Atorvastatin calcium) .Marland Kitchen... 1 tab @ bedtime  Orders: Venipuncture (95621) Prescription Created Electronically [phone]) First annual wellness visit with prevention plan  (H8469) UA Dipstick w/o Micro (automated)  (81003) TLB-Lipid Panel (80061-LIPID) TLB-BMP (Basic Metabolic Panel-BMET) (80048-METABOL) TLB-CBC Platelet - w/Differential (85025-CBCD) TLB-Hepatic/Liver Function Pnl (80076-HEPATIC) TLB-TSH (Thyroid Stimulating Hormone) (84443-TSH) TLB-Digoxin (Lanoxin) (80162-DIG) TLB-PSA (Prostate Specific Antigen) (84153-PSA)  Problem # 4:  GERD (ICD-530.81) Assessment: Improved  His updated medication list for this problem includes:    Prilosec 20 Mg Cpdr (Omeprazole) ..... Once daily  Orders: Venipuncture (62952) Prescription Created Electronically [phone]) First annual wellness visit with prevention plan  (G4010) UA Dipstick w/o Micro (automated)  (81003) TLB-Lipid Panel (80061-LIPID) TLB-BMP (Basic Metabolic Panel-BMET) (80048-METABOL) TLB-CBC Platelet - w/Differential (85025-CBCD) TLB-Hepatic/Liver Function Pnl (80076-HEPATIC) TLB-TSH (Thyroid Stimulating Hormone) (84443-TSH) TLB-Digoxin (Lanoxin)  (80162-DIG) TLB-PSA (Prostate Specific Antigen) (84153-PSA)  Problem # 5:  ANXIETY (ICD-300.00) Assessment: Improved  His updated medication list for this problem includes:    Wellbutrin Sr 150 Mg Tb12 (Bupropion hcl) ..... Once daily  Orders: Venipuncture (52841) TLB-Lipid Panel (80061-LIPID) TLB-BMP (Basic Metabolic Panel-BMET) (80048-METABOL) TLB-CBC Platelet - w/Differential (85025-CBCD) TLB-Hepatic/Liver Function Pnl (80076-HEPATIC) TLB-TSH (Thyroid Stimulating Hormone) (84443-TSH) TLB-Digoxin (Lanoxin) (80162-DIG) TLB-PSA (Prostate Specific Antigen) (84153-PSA)  Complete Medication List: 1)  Wellbutrin Sr 150 Mg Tb12 (Bupropion hcl) .... Once daily 2)  Prilosec 20 Mg Cpdr (Omeprazole) .... Once daily 3)  Atenolol 50 Mg Tabs (Atenolol) .... Once daily 4)  Flomax 0.4 Mg Cp24 (Tamsulosin hcl) .... Once daily 5)  Lipitor 10 Mg Tabs (Atorvastatin calcium) .Marland Kitchen.. 1 tab @ bedtime 6)  Lanoxin 0.125 Mg Tabs (Digoxin) .... Take 1 tab by mouth every other day alternating with 2 tabs by mouth every other day. 7)  Nitrostat 0.4 Mg Subl (Nitroglycerin) .... Uad 8)  Qvar 80 Mcg/act Aers (Beclomethasone dipropionate) .... As needed 9)  Advair Diskus 100-50 Mcg/dose Misc (Fluticasone-salmeterol) .... Inhale 1 puff once a day 10)  Warfarin Sodium 5 Mg Tabs (Warfarin sodium) .... Take one tab once daily as directed 11)  Fluticasone Propionate 50 Mcg/act Susp (Fluticasone propionate) .Marland Kitchen.. 1 spray in each nostril daily as needed 12)  Fish Oil 1000 Mg Caps (Omega-3 fatty acids) .... Take 1 capsule by mouth two times a day 13)  Vitamin D 400 Unit Caps (Cholecalciferol) .... Take 1 tablet by mouth once a day 14)  Chewable Vitamin C 500 Mg Chew (Ascorbic acid) .... Ocassionally 15)  Multivitamins Tabs (Multiple vitamin) .... Take 1 tablet by mouth once a day 16)  Plavix 75 Mg Tabs (Clopidogrel bisulfate) .... Take one tablet by mouth daily  Patient Instructions: 1)  Please schedule a follow-up  appointment in 1 year. 2)  decrease the Flomax to one Monday, Wednesday, Friday Prescriptions: FLUTICASONE PROPIONATE 50 MCG/ACT SUSP (FLUTICASONE PROPIONATE) 1 spray in each nostril daily as needed  #3 x 3   Entered and Authorized by:   [name] MD   Signed by:   [name] MD on 07/18/2009   Method used:   Faxed to ...       [facility]) (mail-order)             , [place]    [place]       Ph:        Fax: [phone]   RxID:   [id number] NITROSTAT 0.4 MG  SUBL (NITROGLYCERIN) UAD  #25 x 1   Entered and Authorized by:   [name] MD   Signed by:   [name] MD on 07/18/2009   Method used:   Faxed to ...       [facility]) (mail-order)             , [place]    [place]       Ph:        Fax: [phone]   RxID:   [id number] LANOXIN 0.125 MG  TABS (DIGOXIN) Take 1 tab by mouth every other day alternating with 2 tabs by mouth every other day.  #150 x 3   Entered and Authorized by:   [name] MD   Signed by:   [name] MD on 07/18/2009   Method used:   Faxed to ...       [facility] Environmental education officer)             , [place]  [place]       Ph:        Fax: [phone]   RxID:   [id number] LIPITOR 10 MG  TABS (ATORVASTATIN CALCIUM) 1 tab @ bedtime  #100 x 3   Entered and Authorized by:   [name] MD   Signed by:   [name] MD on 07/18/2009   Method used:   Faxed to ...       [facility]) (mail-order)             , [place]    [place]       Ph:        Fax: [phone]   RxID:   [id number] FLOMAX 0.4 MG  CP24 (TAMSULOSIN HCL) once daily  #100 x 3   Entered and Authorized by:   [name] MD   Signed by:   [name] MD on 07/18/2009   Method used:   Faxed to ...       [facility]) (mail-order)             , [place]    [place]       Ph:        Fax: [phone]   RxID:   [id number] ATENOLOL 50 MG  TABS (ATENOLOL) once daily  #100 x 3   Entered and Authorized by:   [name] MD   Signed by:    [name] MD on 07/18/2009   Method used:   Faxed to ...       [facility]) (mail-order)             , [place]    [place]       Ph:        Fax: [phone]   RxID:   [id number] PRILOSEC 20 MG  CPDR (OMEPRAZOLE) once daily  #100 x 3   Entered and Authorized by:   [name] MD   Signed by:   [name] MD on 07/18/2009   Method used:   Faxed to ...       [facility]) (mail-order)             , [place]    [place]       Ph:        Fax: [phone]   RxID:   [id number] WELLBUTRIN SR 150 MG  TB12 (BUPROPION HCL) once daily  #100 x 3   Entered and Authorized by:   [name] MD   Signed by:   [name] MD on 07/18/2009   Method used:   Faxed to ...       [facility]) (mail-order)             , [place]    [place]       Ph:        Fax: [phone]   RxID:   [id number]     Laboratory Results   Urine Tests  Date/Time Recieved: July 18, 2009 12:42 PM  Date/Time Reported: July 18, 2009 12:42 PM   Routine Urinalysis   Color: yellow Appearance: Clear Glucose: negative   (Normal Range: Negative) Bilirubin: negative   (Normal Range: Negative) Ketone: negative   (Normal Range: Negative) Spec. Gravity: 1.015   (Normal Range: 1.003-1.035) Blood: trace-lysed   (Normal Range: Negative) pH: 6.0   (Normal Range: 5.0-8.0) Protein: negative   (Normal Range: Negative) Urobilinogen: 0.2   (Normal Range: 0-1) Nitrite: negative   (  Normal Range: Negative) Leukocyte Esterace: negative   (Normal Range: Negative)    Comments: Wynona Canes, CMA  July 18, 2009 12:42 PM

## 2010-03-10 NOTE — Assessment & Plan Note (Signed)
Summary: f24m   Visit Type:  Follow-up Primary Provider:  [name] MD  CC:  no complaints.  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 He had a remote right ventricular MRI 20 years ago. In November of 2010 he had PCI of the circumflex marginal branch with a drug-eluting stent. He has chronic total occlusion of a small nondominant RCA. His scan showed ST changes but no perfusion defect prior to PCI so we did a stress test post PCI and his ST segment changes were better.  His other problem is paroxysmal atrial fibrillation. The last episode he had was in [place] in the fall of 2010.  he has had no recent chest pain shortness of breath or palpitations.  His other problem is hypertension hyperlipidemia. His recent lipid profile showed a total cholesterol 123, and LDL of 63, and HDL of 34. His potassium was 5.5 with a creatinine of 1.1  Current Medications (verified): 1)  Wellbutrin Sr 150 Mg  Tb12 (Bupropion Hcl) .... Once Daily 2)  Prilosec 20 Mg  Cpdr (Omeprazole) .... Once Daily 3)  Atenolol 50 Mg  Tabs (Atenolol) .... Once Daily 4)  Flomax 0.4 Mg  Cp24 (Tamsulosin Hcl) .Marland Kitchen.. 1 Tab Every Other Day 5)  Lipitor 10 Mg  Tabs (Atorvastatin Calcium) .Marland Kitchen.. 1 Tab @ Bedtime 6)  Lanoxin 0.125 Mg  Tabs (Digoxin) .... Take 1 Tab By Mouth Every Other Day Alternating With 2 Tabs By Mouth Every Other Day. 7)  Nitrostat 0.4 Mg  Subl (Nitroglycerin) .... Uad 8)  Qvar 80 Mcg/act  Aers (Beclomethasone Dipropionate) .... As Needed 9)  Advair Diskus 100-50 Mcg/dose  Misc (Fluticasone-Salmeterol) .... Inhale 1 Puff Once A Day 10)  Warfarin Sodium 5 Mg Tabs (Warfarin Sodium) .... Take One Tab Once Daily As Directed 11)  Fluticasone Propionate 50 Mcg/act Susp (Fluticasone Propionate) .Marland Kitchen.. 1 Spray in Each Nostril Daily As Needed 12)  Fish Oil 1000 Mg Caps (Omega-3 Fatty Acids) .... Take 1 Capsule By Mouth Two Times A Day 13)  Vitamin D 400 Unit Caps  (Cholecalciferol) .... Take 1 Tablet By Mouth Once A Day 14)  Multivitamins  Tabs (Multiple Vitamin) .... Take 1 Tablet By Mouth Once A Day 15)  Plavix 75 Mg Tabs (Clopidogrel Bisulfate) .... Take One Tablet By Mouth Daily  Allergies (verified): No Known Drug Allergies  Past History:  Past Medical History: Reviewed history from 04/22/2008 and no changes required. CAD Sinus Bradycardia Anxiety Asthma Atrial fibrillation GERD Hyperlipidemia Hypertension Allergic rhinitis  Review of Systems       ROS is negative except as outlined in HPI.   Vital Signs:  Patient profile:   77 year old male Height:      69 inches Weight:      169 pounds BMI:     25.05 Pulse rate:   62 / minute BP sitting:   140 / 60  (left arm) Cuff size:   regular  Vitals Entered By: Burnett Kanaris, CNA (August 05, 2009 4:24 PM)  Physical Exam  Additional Exam:  Gen. Well-nourished, in no distress   Neck: No JVD, thyroid not enlarged, no carotid bruits Lungs: No tachypnea, clear without rales, rhonchi or wheezes Cardiovascular: Rhythm regular, PMI not displaced,  heart sounds  normal, no murmurs or gallops, no peripheral edema, pulses normal in all 4 extremities. Abdomen: BS normal, abdomen soft and non-tender without masses or organomegaly, no hepatosplenomegaly. MS: No deformities, no cyanosis or clubbing  Neuro:  No focal sns   Skin:  no lesions    Impression & Recommendations:  Problem # 1:  CAD, NATIVE VESSEL (ICD-414.01) His now 7 months status post drug-eluting stent to the circumflex marginal branch. He is on Coumadin and Plavix and is having quite a bit of bruising. We'll plan to stop the Plavix and substitute a baby aspirin. We will continue the Coumadin which is for atrial fibrillation. We'll plan followup in 6 months and plan followup with Dr. Shirlee Latch next year. His updated medication list for this problem includes:    Atenolol 50 Mg Tabs (Atenolol) ..... Once daily    Nitrostat 0.4 Mg  Subl (Nitroglycerin) ..... Uad    Warfarin Sodium 5 Mg Tabs (Warfarin sodium) .Marland Kitchen... Take one tab once daily as directed    Aspirin 81 Mg Tbec (Aspirin) .Marland Kitchen... Take one tablet by mouth daily  Problem # 2:  ATRIAL FIBRILLATION (ICD-427.31) He has paroxysmal atrial fibrillation managed with rate control and Coumadin. His last episode was last fall. We will continue current therapy. We plan to stop the Plavix as above. He was taking ibuprofen before his stent placement and Plavix without problems. I told him it was okay to take it on a p.r.n. basis. If he takes it regularly I suggested that he get more frequent protime checks at least initially.  His updated medication list for this problem includes:    Atenolol 50 Mg Tabs (Atenolol) ..... Once daily    Lanoxin 0.125 Mg Tabs (Digoxin) .Marland Kitchen... Take 1 tab by mouth every other day alternating with 2 tabs by mouth every other day.    Warfarin Sodium 5 Mg Tabs (Warfarin sodium) .Marland Kitchen... Take one tab once daily as directed    Aspirin 81 Mg Tbec (Aspirin) .Marland Kitchen... Take one tablet by mouth daily  Problem # 3:  HYPERLIPIDEMIA (ICD-272.4) His last lipid profile was quite good except for a low HDL. Continue current therapy and dietary therapy. His updated medication list for this problem includes:    Lipitor 10 Mg Tabs (Atorvastatin calcium) .Marland Kitchen... 1 tab @ bedtime  Patient Instructions: 1)  Your physician recommends that you return for lab work on a day that is good for you: bmet (276.7) 2)  Your physician has recommended you make the following change in your medication: 1) STOP Plavix, 2) Start ASA 81mg  once daily  3)  Your physician wants you to follow-up in: 6 months.  You will receive a reminder letter in the mail two months in advance. If you don't receive a letter, please call our office to schedule the follow-up appointment.  Appended Document: f58m Due to an elevated potassium of 5.5 on 07/18/09, we will have the patient return for a bmet.

## 2010-03-10 NOTE — Medication Information (Signed)
Summary: rov/ewj  Anticoagulant Therapy  Managed by: Weston Brass, PharmD Referring MD: Charlies Constable, MD PCP: [name] MD Supervising MD: Tenny Craw MD, Gunnar Fusi Indication 1: Atrial Fibrillation (ICD-427.31) Lab Used: [facility] [place] Site: [address] INR POC 2.8 INR RANGE 2 - 3  Dietary changes: no    Health status changes: no    Bleeding/hemorrhagic complications: no    Recent/future hospitalizations: no    Any changes in medication regimen? no    Recent/future dental: no  Any missed doses?: no       Is patient compliant with meds? yes       Allergies: No Known Drug Allergies  Anticoagulation Management History:      The patient is taking warfarin and comes in today for a routine follow up visit.  Positive risk factors for bleeding include an age of 75 years or older.  The bleeding index is 'intermediate risk'.  Positive CHADS2 values include History of HTN and Age > 75 years old.  The start date was 05/03/2002.  His last INR was 1.7 ratio.  Anticoagulation responsible provider: Tenny Craw MD, Gunnar Fusi.  INR POC: 2.8.  Cuvette Lot#: [id number].  Exp: 07/2010.    Anticoagulation Management Assessment/Plan:      The patient's current anticoagulation dose is Warfarin sodium 5 mg tabs: take one tab once daily as directed.  The target INR is 2 - 3.  The next INR is due 07/14/2009.  Anticoagulation instructions were given to patient.  Results were reviewed/authorized by Weston Brass, PharmD.  He was notified by Weston Brass PharmD.         Prior Anticoagulation Instructions: INR 2.3  Continue on same dosage 1 tablet daily except 1/2 tablet on Mondays, Wednesdays, and Fridays.  Recheck in 4 weeks.    Current Anticoagulation Instructions: INR 2.8  Continue same dose of 1 tablet every day except 1/2 tablet on Monday, Wednesday and Friday.  Recheck in 4 weeks.

## 2010-03-10 NOTE — Medication Information (Signed)
Summary: rov/ln  Anticoagulant Therapy  Managed by: Bethena Midget, RN, BSN Referring MD: Charlies Constable, MD PCP: [name] MD Supervising MD: Myrtis Ser MD, Tinnie Gens Indication 1: Atrial Fibrillation (ICD-427.31) Lab Used: [facility]  Site: [address] INR POC 2.0 INR RANGE 2 - 3  Dietary changes: no    Health status changes: no    Bleeding/hemorrhagic complications: no    Recent/future hospitalizations: no    Any changes in medication regimen? no    Recent/future dental: no  Any missed doses?: no       Is patient compliant with meds? yes       Allergies: No Known Drug Allergies  Anticoagulation Management History:      The patient is taking warfarin and comes in today for a routine follow up visit.  Positive risk factors for bleeding include an age of 75 years or older.  The bleeding index is 'intermediate risk'.  Positive CHADS2 values include History of HTN and Age > 62 years old.  The start date was 05/03/2002.  His last INR was 1.7 ratio.  Anticoagulation responsible provider: Myrtis Ser MD, Tinnie Gens.  INR POC: 2.0.  Cuvette Lot#: [id number].  Exp: 11/2010.    Anticoagulation Management Assessment/Plan:      The patient's current anticoagulation dose is Warfarin sodium 5 mg tabs: take one tab once daily as directed.  The target INR is 2 - 3.  The next INR is due 10/20/2009.  Anticoagulation instructions were given to patient.  Results were reviewed/authorized by Bethena Midget, RN, BSN.  He was notified by Bethena Midget, RN, BSN.         Prior Anticoagulation Instructions: INR 2.4  Continue same regimen of 1 tab on Sunday, Tuesday, Thursday, and Saturday and 1/2 tab on Monday, Wednesday, and Friday.  Re-check INR in 4 weeks.  Current Anticoagulation Instructions: INR 2.0 Today take 5mg s then resume 5mg s everyday except 2.5mg s on Mondays, Wednesdays and Fridays. Recheck in 4 weeks.

## 2010-03-10 NOTE — Medication Information (Signed)
Summary: rov/sak  Anticoagulant Therapy  Managed by: Reina Fuse, PharmD Referring MD: Charlies Constable, MD PCP: [name] MD Supervising MD: Tenny Craw MD, Gunnar Fusi Indication 1: Atrial Fibrillation (ICD-427.31) Lab Used: [facility] [place] Site: [address] INR POC 2.4 INR RANGE 2 - 3  Dietary changes: yes       Details: Little less greens than usual  Health status changes: no    Bleeding/hemorrhagic complications: no    Recent/future hospitalizations: no    Any changes in medication regimen? no    Recent/future dental: no  Any missed doses?: no       Is patient compliant with meds? yes       Current Medications (verified): 1)  Wellbutrin Sr 150 Mg  Tb12 (Bupropion Hcl) .... Once Daily 2)  Prilosec 20 Mg  Cpdr (Omeprazole) .... Once Daily 3)  Atenolol 50 Mg  Tabs (Atenolol) .... Once Daily 4)  Flomax 0.4 Mg  Cp24 (Tamsulosin Hcl) .Marland Kitchen.. 1 Tab Every Other Day 5)  Lipitor 10 Mg  Tabs (Atorvastatin Calcium) .Marland Kitchen.. 1 Tab @ Bedtime 6)  Lanoxin 0.125 Mg  Tabs (Digoxin) .... Take 1 Tab By Mouth Every Other Day Alternating With 2 Tabs By Mouth Every Other Day. 7)  Nitrostat 0.4 Mg  Subl (Nitroglycerin) .... Uad 8)  Qvar 80 Mcg/act  Aers (Beclomethasone Dipropionate) .... As Needed 9)  Advair Diskus 100-50 Mcg/dose  Misc (Fluticasone-Salmeterol) .... Inhale 1 Puff Once A Day 10)  Warfarin Sodium 5 Mg Tabs (Warfarin Sodium) .... Take One Tab Once Daily As Directed 11)  Fluticasone Propionate 50 Mcg/act Susp (Fluticasone Propionate) .Marland Kitchen.. 1 Spray in Each Nostril Daily As Needed 12)  Fish Oil 1000 Mg Caps (Omega-3 Fatty Acids) .... Take 1 Capsule By Mouth Two Times A Day 13)  Vitamin D 400 Unit Caps (Cholecalciferol) .... Take 1 Tablet By Mouth Once A Day 14)  Multivitamins  Tabs (Multiple Vitamin) .... Take 1 Tablet By Mouth Once A Day 15)  Aspirin 81 Mg Tbec (Aspirin) .... Take One Tablet By Mouth Daily  Allergies (verified): No Known Drug Allergies  Anticoagulation  Management History:      The patient is taking warfarin and comes in today for a routine follow up visit.  Positive risk factors for bleeding include an age of 75 years or older.  The bleeding index is 'intermediate risk'.  Positive CHADS2 values include History of HTN and Age > 50 years old.  The start date was 05/03/2002.  His last INR was 1.7 ratio.  Anticoagulation responsible Keniyah Gelinas: Tenny Craw MD, Gunnar Fusi.  INR POC: 2.4.  Cuvette Lot#: [id number].  Exp: 12/2010.    Anticoagulation Management Assessment/Plan:      The patient's current anticoagulation dose is Warfarin sodium 5 mg tabs: take one tab once daily as directed.  The target INR is 2 - 3.  The next INR is due 12/15/2009.  Anticoagulation instructions were given to patient.  Results were reviewed/authorized by Reina Fuse, PharmD.  He was notified by Reina Fuse PharmD.         Prior Anticoagulation Instructions: INR 2.4  Continue taking 1 tablet everyday except take 1/2 tablet on Monday, Wednesday, and Friday. Re-check INR in 4 weeks.   Current Anticoagulation Instructions: INR 2.4  Continue taking Coumadin 2.5 mg (0.5 tab) on Mon, Wed, Fri and Coumadin 5 mg (1 tab) on Sun, Tues, Thur, Sat.  Return to clinic in 4 weeks.

## 2010-03-10 NOTE — Medication Information (Signed)
Summary: rov/sp  Anticoagulant Therapy  Managed by: Weston Brass, PharmD Referring MD: Charlies Constable, MD PCP: [name] MD Supervising MD: Tenny Craw MD, Gunnar Fusi Indication 1: Atrial Fibrillation (ICD-427.31) Lab Used: [facility]  Site: [address] INR POC 2.7 INR RANGE 2 - 3  Dietary changes: no    Health status changes: no    Bleeding/hemorrhagic complications: no    Recent/future hospitalizations: no    Any changes in medication regimen? no    Recent/future dental: no  Any missed doses?: no       Is patient compliant with meds? yes       Allergies: No Known Drug Allergies  Anticoagulation Management History:      The patient is taking warfarin and comes in today for a routine follow up visit.  Positive risk factors for bleeding include an age of 75 years or older.  The bleeding index is 'intermediate risk'.  Positive CHADS2 values include History of HTN and Age > 61 years old.  The start date was 05/03/2002.  His last INR was 1.7 ratio.  Anticoagulation responsible provider: Tenny Craw MD, Gunnar Fusi.  INR POC: 2.7.  Cuvette Lot#: [id number].  Exp: 09/2010.    Anticoagulation Management Assessment/Plan:      The patient's current anticoagulation dose is Warfarin sodium 5 mg tabs: take one tab once daily as directed.  The target INR is 2 - 3.  The next INR is due 08/15/2009.  Anticoagulation instructions were given to patient.  Results were reviewed/authorized by Weston Brass, PharmD.  He was notified by Weston Brass PharmD.         Prior Anticoagulation Instructions: INR 2.8  Continue same dose of 1 tablet every day except 1/2 tablet on Monday, Wednesday and Friday.  Recheck in 4 weeks.   Current Anticoagulation Instructions: INR 2.7  Continue same dose of 1 tablet every day except 1/2 tablet on Monday, Wednesday and Friday

## 2010-03-12 NOTE — Medication Information (Signed)
Summary: rov/mb  Anticoagulant Therapy  Managed by: Cloyde Reams, RN, BSN Referring MD: Charlies Constable, MD PCP: [name] MD Supervising MD: Shirlee Latch MD, Dalton Indication 1: Atrial Fibrillation (ICD-427.31) Lab Used: [facility] [place] Site: [address] INR POC 2.3 INR RANGE 2 - 3  Dietary changes: no    Health status changes: no    Bleeding/hemorrhagic complications: no    Recent/future hospitalizations: no    Any changes in medication regimen? no    Recent/future dental: no  Any missed doses?: no       Is patient compliant with meds? yes       Allergies: No Known Drug Allergies  Anticoagulation Management History:      The patient is taking warfarin and comes in today for a routine follow up visit.  Positive risk factors for bleeding include an age of 75 years or older.  The bleeding index is 'intermediate risk'.  Positive CHADS2 values include History of HTN and Age > 75 years old.  The start date was 05/03/2002.  His last INR was 2.5.  Anticoagulation responsible provider: Shirlee Latch MD, Dalton.  INR POC: 2.3.  Cuvette Lot#: [id number].  Exp: 03/2011.    Anticoagulation Management Assessment/Plan:      The patient's current anticoagulation dose is Warfarin sodium 5 mg tabs: take one tab once daily as directed.  The target INR is 2 - 3.  The next INR is due 02/23/2010.  Anticoagulation instructions were given to patient.  Results were reviewed/authorized by Cloyde Reams, RN, BSN.  He was notified by Cloyde Reams RN.         Prior Anticoagulation Instructions: INR 2.5 Continue current dose: 1/2 tablet (2.5 mg) on Monday, Wednesday, Fridays and 1 tablet (5mg ) the rest of the days. Recheck INR in 4 weeks.  Current Anticoagulation Instructions: INR 2.3  Continue on same dosage 1 tablet daily except 1/2 tablet on Mondays, Wednesdays, and Fridays.  Recheck in 4 weeks.

## 2010-03-12 NOTE — Medication Information (Signed)
Summary: rov/ewj  Anticoagulant Therapy  Managed by: Weston Brass, PharmD Referring MD: Charlies Constable, MD PCP: [name] MD Supervising MD: Gala Romney MD, Reuel Boom Indication 1: Atrial Fibrillation (ICD-427.31) Lab Used: [facility] [place] Site: [address] INR POC 2.2 INR RANGE 2 - 3  Dietary changes: yes       Details: Increased green leafy vegetables  Health status changes: no    Bleeding/hemorrhagic complications: no    Recent/future hospitalizations: no    Any changes in medication regimen? no    Recent/future dental: no  Any missed doses?: no       Is patient compliant with meds? yes       Allergies: No Known Drug Allergies  Anticoagulation Management History:      The patient is taking warfarin and comes in today for a routine follow up visit.  Positive risk factors for bleeding include an age of 75 years or older.  The bleeding index is 'intermediate risk'.  Positive CHADS2 values include History of HTN and Age > 75 years old.  The start date was 05/03/2002.  His last INR was 2.5.  Anticoagulation responsible provider: Monico Sudduth MD, Reuel Boom.  INR POC: 2.2.  Cuvette Lot#: [id number].  Exp: 03/2011.    Anticoagulation Management Assessment/Plan:      The patient's current anticoagulation dose is Warfarin sodium 5 mg tabs: take one tab once daily as directed.  The target INR is 2 - 3.  The next INR is due 03/23/2010.  Anticoagulation instructions were given to patient.  Results were reviewed/authorized by Weston Brass, PharmD.  He was notified by Linward Headland, PharmD candidate.         Prior Anticoagulation Instructions: INR 2.3  Continue on same dosage 1 tablet daily except 1/2 tablet on Mondays, Wednesdays, and Fridays.  Recheck in 4 weeks.    Current Anticoagulation Instructions: INR 2.2 (INR goal: 2-3)  Take 1 tablet everyday except 1/2 tablet on Mondays, Wednesdays, and Fridays.  Recheck in 4 weeks.

## 2010-03-12 NOTE — Assessment & Plan Note (Signed)
Summary: f39m   Visit Type:  Follow-up Primary [name] [name]:  [name] Pee MD  CC:  no complaints.  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 He had a remote right ventricular MR 20 years ago. In November of 2010 he had PCI of the circumflex marginal branch with a drug-eluting stent. He has chronic total occlusion of a small nondominant RCA. His scan showed ST changes but no perfusion defect prior to PCI so we did a stress test post PCI and his ST segment changes were better.  His other problem is paroxysmal atrial fibrillation. The last episode he had was in [place] in the fall of 2010.  he has had no recent chest pain shortness of breath or palpitations.  His other problem is hypertension hyperlipidemia. He has had a low HDL of 34.  Current Medications (verified): 1)  Wellbutrin Sr 150 Mg  Tb12 (Bupropion Hcl) .... Once Daily 2)  Prilosec 20 Mg  Cpdr (Omeprazole) .... Once Daily 3)  Atenolol 50 Mg  Tabs (Atenolol) .... Once Daily 4)  Flomax 0.4 Mg  Cp24 (Tamsulosin Hcl) .Marland Kitchen.. 1 Tab Every Other Day 5)  Lipitor 10 Mg  Tabs (Atorvastatin Calcium) .Marland Kitchen.. 1 Tab @ Bedtime 6)  Lanoxin 0.125 Mg  Tabs (Digoxin) .... Take One Daily 7)  Nitrostat 0.4 Mg  Subl (Nitroglycerin) .... Uad 8)  Qvar 80 Mcg/act  Aers (Beclomethasone Dipropionate) .... As Needed 9)  Advair Diskus 100-50 Mcg/dose  Misc (Fluticasone-Salmeterol) .... Inhale 1 Puff Once A Day 10)  Warfarin Sodium 5 Mg Tabs (Warfarin Sodium) .... Take One Tab Once Daily As Directed 11)  Fluticasone Propionate 50 Mcg/act Susp (Fluticasone Propionate) .Marland Kitchen.. 1 Spray in Each Nostril Daily As Needed 12)  Fish Oil 1000 Mg Caps (Omega-3 Fatty Acids) .... Take 1 Capsule By Mouth Two Times A Day 13)  Vitamin D 400 Unit Caps (Cholecalciferol) .... Take 1 Tablet By Mouth Once A Day 14)  Multivitamins  Tabs (Multiple Vitamin) .... Take 1 Tablet By Mouth Once A Day 15)  Aspirin 81 Mg Tbec (Aspirin) .... Take One  Tablet By Mouth Daily  Allergies (verified): No Known Drug Allergies  Past History:  Past Medical History: Reviewed history from 04/22/2008 and no changes required. CAD Sinus Bradycardia Anxiety Asthma Atrial fibrillation GERD Hyperlipidemia Hypertension Allergic rhinitis  Review of Systems       ROS is negative except as outlined in HPI.   Vital Signs:  Patient profile:   77 year old male Height:      69 inches Weight:      170 pounds BMI:     25.20 Pulse rate:   64 / minute Pulse rhythm:   regular BP sitting:   142 / 80  (left arm)  Vitals Entered By: [name] [name], CMA (January 19, 2010 2:47 PM)  Physical Exam  Additional Exam:  Gen. Well-nourished, in no distress   Neck: No JVD, thyroid not enlarged, no carotid bruits Lungs: No tachypnea, clear without rales, rhonchi or wheezes Cardiovascular: Rhythm regular, PMI not displaced,  heart sounds  normal, no murmurs or gallops, no peripheral edema, pulses normal in all 4 extremities. Abdomen: BS normal, abdomen soft and non-tender without masses or organomegaly, no hepatosplenomegaly. MS: No deformities, no cyanosis or clubbing   Neuro:  No focal sns   Skin:  no lesions    Impression & Recommendations:  Problem # 1:  CAD, NATIVE VESSEL (ICD-414.01) Dr. Sherrie [name] had a remote  RV infarct and had a drug loading stent placed to the circumflex artery in November of 2010. He has had no chest pain and this problem is stable. His updated medication list for this problem includes:    Atenolol 50 Mg Tabs (Atenolol) ..... Once daily    Nitrostat 0.4 Mg Subl (Nitroglycerin) ..... Uad    Warfarin Sodium 5 Mg Tabs (Warfarin sodium) .Marland Kitchen... Take one tab once daily as directed    Aspirin 81 Mg Tbec (Aspirin) .Marland Kitchen... Take one tablet by mouth daily  His updated medication list for this problem includes:    Atenolol 50 Mg Tabs (Atenolol) ..... Once daily    Nitrostat 0.4 Mg Subl (Nitroglycerin) ..... Uad    Warfarin Sodium 5 Mg  Tabs (Warfarin sodium) .Marland Kitchen... Take one tab once daily as directed    Aspirin 81 Mg Tbec (Aspirin) .Marland Kitchen... Take one tablet by mouth daily  Orders: EKG w/ Interpretation (93000)  Problem # 2:  ATRIAL FIBRILLATION (ICD-427.31) He has a history of paroxysmal atrial fibrillation the last episode of which was in September of 2010. He is to [place] Vasc score 3 or 4 depending upon whether you count hypertension. We discussed the continued need for long-term Coumadin therapy. His updated medication list for this problem includes:    Atenolol 50 Mg Tabs (Atenolol) ..... Once daily    Lanoxin 0.125 Mg Tabs (Digoxin) .Marland Kitchen... Take one daily    Warfarin Sodium 5 Mg Tabs (Warfarin sodium) .Marland Kitchen... Take one tab once daily as directed    Aspirin 81 Mg Tbec (Aspirin) .Marland Kitchen... Take one tablet by mouth daily  Problem # 3:  HYPERLIPIDEMIA (ICD-272.4) He has hyperlipidemia managed with Lipitor with low HDL. We discussed the possibility of him being involved in the REVEAL trial. I told him might have one of our nurses call him. His updated medication list for this problem includes:    Lipitor 10 Mg Tabs (Atorvastatin calcium) .Marland Kitchen... 1 tab @ bedtime  Patient Instructions: 1)  Your physician recommends that you continue on your current medications as directed. Please refer to the Current Medication list given to you today. 2)  Your physician wants you to follow-up in: 6 months with Dr. Shirlee [name].  You will receive a reminder letter in the mail two months in advance. If you don't receive a letter, please call our office to schedule the follow-up appointment.

## 2010-03-23 ENCOUNTER — Encounter (INDEPENDENT_AMBULATORY_CARE_PROVIDER_SITE_OTHER): Payer: Medicare Other

## 2010-03-23 ENCOUNTER — Encounter: Payer: Self-pay | Admitting: Cardiology

## 2010-03-23 DIAGNOSIS — Z7901 Long term (current) use of anticoagulants: Secondary | ICD-10-CM

## 2010-03-23 DIAGNOSIS — I4891 Unspecified atrial fibrillation: Secondary | ICD-10-CM

## 2010-04-01 NOTE — Medication Information (Addendum)
Summary: [facility]  Anticoagulant Therapy  Managed by: Weston Brass, PharmD Referring MD: Charlies Constable, MD PCP: [name] MD Supervising MD: Patty Sermons Indication 1: Atrial Fibrillation (ICD-427.31) Lab Used: [facility] [place] Site: [address] INR POC 2.1 INR RANGE 2 - 3  Dietary changes: yes       Details: a little less than normal  Health status changes: no    Bleeding/hemorrhagic complications: no    Recent/future hospitalizations: no    Any changes in medication regimen? no    Recent/future dental: no  Any missed doses?: no       Is patient compliant with meds? yes       Allergies: No Known Drug Allergies  Anticoagulation Management History:      The patient is taking warfarin and comes in today for a routine follow up visit.  Positive risk factors for bleeding include an age of 75 years or older.  The bleeding index is 'intermediate risk'.  Positive CHADS2 values include History of HTN and Age > 53 years old.  The start date was 05/03/2002.  His last INR was 2.5.  Anticoagulation responsible provider: Berline Semrad.  INR POC: 2.1.  Cuvette Lot#: [id number].  Exp: 02/2011.    Anticoagulation Management Assessment/Plan:      The patient's current anticoagulation dose is Warfarin sodium 5 mg tabs: take one tab once daily as directed.  The target INR is 2 - 3.  The next INR is due 04/20/2010.  Anticoagulation instructions were given to patient.  Results were reviewed/authorized by Weston Brass, PharmD.  He was notified by Margot Chimes PharmD Candidate.         Prior Anticoagulation Instructions: INR 2.2 (INR goal: 2-3)  Take 1 tablet everyday except 1/2 tablet on Mondays, Wednesdays, and Fridays.  Recheck in 4 weeks.   Current Anticoagulation Instructions: INR 2.1  Continue current dose of 1 tablet everyday except 1/2 tablet on Mondays, Wednesdays, and Fridays. Recheck in 4 weeks.

## 2010-04-15 ENCOUNTER — Other Ambulatory Visit: Payer: Self-pay | Admitting: Family Medicine

## 2010-04-20 ENCOUNTER — Encounter (INDEPENDENT_AMBULATORY_CARE_PROVIDER_SITE_OTHER): Payer: Medicare Other

## 2010-04-20 ENCOUNTER — Encounter: Payer: Self-pay | Admitting: Cardiology

## 2010-04-20 ENCOUNTER — Other Ambulatory Visit: Payer: Self-pay | Admitting: Family Medicine

## 2010-04-20 DIAGNOSIS — I4891 Unspecified atrial fibrillation: Secondary | ICD-10-CM

## 2010-04-20 DIAGNOSIS — Z7901 Long term (current) use of anticoagulants: Secondary | ICD-10-CM

## 2010-04-23 ENCOUNTER — Telehealth: Payer: Self-pay | Admitting: Family Medicine

## 2010-04-23 ENCOUNTER — Ambulatory Visit: Payer: Medicare Other | Admitting: Family Medicine

## 2010-04-23 DIAGNOSIS — J45901 Unspecified asthma with (acute) exacerbation: Secondary | ICD-10-CM

## 2010-04-23 MED ORDER — BECLOMETHASONE DIPROPIONATE 40 MCG/ACT IN AERS: 2.0000 | INHALATION_SPRAY | Freq: Two times a day (BID) | RESPIRATORY_TRACT | Status: DC

## 2010-04-23 NOTE — Telephone Encounter (Signed)
Patient is wheezing more today than at other times. Please advise of what he needs to do. Was on Advair or Qvar.       [name] [name].

## 2010-04-23 NOTE — Telephone Encounter (Signed)
Wife wants to know from Dr. Tawanna Cooler what to do as he is out of meds.

## 2010-04-28 NOTE — Medication Information (Signed)
Summary: rov/pc  Anticoagulant Therapy  Managed by: Weston Brass, PharmD Referring MD: Charlies Constable, MD PCP: [name] MD Supervising MD: Jens Som MD, Arlys John Indication 1: Atrial Fibrillation (ICD-427.31) Lab Used: [facility] [place] Site: [address] INR POC 2.1 INR RANGE 2 - 3  Dietary changes: no    Health status changes: no    Bleeding/hemorrhagic complications: no    Recent/future hospitalizations: no    Any changes in medication regimen? no    Recent/future dental: no  Any missed doses?: no       Is patient compliant with meds? yes       Allergies: No Known Drug Allergies  Anticoagulation Management History:      The patient is taking warfarin and comes in today for a routine follow up visit.  Positive risk factors for bleeding include an age of 55 years or older.  The bleeding index is 'intermediate risk'.  Positive CHADS2 values include History of HTN and Age > 21 years old.  The start date was 05/03/2002.  His last INR was 2.5.  Anticoagulation responsible provider: Jens Som MD, Arlys John.  INR POC: 2.1.  Cuvette Lot#: [id number].  Exp: 04/2011.    Anticoagulation Management Assessment/Plan:      The patient's current anticoagulation dose is Warfarin sodium 5 mg tabs: take one tab once daily as directed.  The target INR is 2 - 3.  The next INR is due 05/18/2010.  Anticoagulation instructions were given to patient.  Results were reviewed/authorized by Weston Brass, PharmD.  He was notified by Weston Brass PharmD.         Prior Anticoagulation Instructions: INR 2.1  Continue current dose of 1 tablet everyday except 1/2 tablet on Mondays, Wednesdays, and Fridays. Recheck in 4 weeks.   Current Anticoagulation Instructions: INR 2.1  Continue same dose of 1 tablet every day except 1/2 tablet on Monday, Wednesday and Friday.  Recheck INR in 4 weeks.

## 2010-05-13 LAB — CBC
HCT: 41.7 % (ref 39.0–52.0)
Hemoglobin: 13.8 g/dL (ref 13.0–17.0)
Hemoglobin: 14.4 g/dL (ref 13.0–17.0)
MCHC: 34.4 g/dL (ref 30.0–36.0)
MCHC: 34.7 g/dL (ref 30.0–36.0)
MCV: 92.4 fL (ref 78.0–100.0)
Platelets: 183 K/uL (ref 150–400)
RBC: 4.3 MIL/uL (ref 4.22–5.81)
RBC: 4.51 MIL/uL (ref 4.22–5.81)
RDW: 13 % (ref 11.5–15.5)
WBC: 9.3 K/uL (ref 4.0–10.5)

## 2010-05-13 LAB — BASIC METABOLIC PANEL
CO2: 27 mEq/L (ref 19–32)
CO2: 30 mEq/L (ref 19–32)
Calcium: 8.8 mg/dL (ref 8.4–10.5)
Calcium: 8.9 mg/dL (ref 8.4–10.5)
Chloride: 107 mEq/L (ref 96–112)
GFR calc Af Amer: >60 mL/min (ref >60)
Glucose, Bld: 108 mg/dL — ABNORMAL HIGH (ref 70–99)
Potassium: 4.4 mEq/L (ref 3.5–5.1)
Sodium: 139 mEq/L (ref 135–145)
Sodium: 139 mEq/L (ref 135–145)

## 2010-05-18 ENCOUNTER — Ambulatory Visit (INDEPENDENT_AMBULATORY_CARE_PROVIDER_SITE_OTHER): Payer: Medicare Other | Admitting: *Deleted

## 2010-05-18 DIAGNOSIS — Z7901 Long term (current) use of anticoagulants: Secondary | ICD-10-CM

## 2010-05-18 DIAGNOSIS — I4891 Unspecified atrial fibrillation: Secondary | ICD-10-CM

## 2010-05-25 ENCOUNTER — Encounter (INDEPENDENT_AMBULATORY_CARE_PROVIDER_SITE_OTHER): Payer: Medicare Other

## 2010-05-25 DIAGNOSIS — R0989 Other specified symptoms and signs involving the circulatory and respiratory systems: Secondary | ICD-10-CM

## 2010-06-15 ENCOUNTER — Ambulatory Visit (INDEPENDENT_AMBULATORY_CARE_PROVIDER_SITE_OTHER): Payer: Medicare Other | Admitting: *Deleted

## 2010-06-15 DIAGNOSIS — I4891 Unspecified atrial fibrillation: Secondary | ICD-10-CM

## 2010-06-15 DIAGNOSIS — Z7901 Long term (current) use of anticoagulants: Secondary | ICD-10-CM

## 2010-06-23 NOTE — Assessment & Plan Note (Signed)
[facility]                         ELECTROPHYSIOLOGY OFFICE NOTE   NAME:[name] [name], [name] [name]                        MRN:          [id number]  DATE:06/17/2006                            DOB:          [birth year]    REFERRING PHYSICIAN:  Cecil Cranker, MD, [facility]   Dr. Sherrie Mustache is referred today for evaluation of atrial fibrillation by  Dr. Glennon Hamilton. 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 He has a history of remote  coronary disease and underwent catheterization in 1984. At that time, he  had had a very small inferior MI and his right coronary artery could not  be cannulated. He did have a 30% circumflex stenosis. He had a stress  Cardiolite in 2004 and again in 2006 demonstrating preserved LV function  and no obvious ischemia. He developed atrial flutter back in 2004 after  a knee replacement and since then has had episodes of atrial  fibrillation. He notes that up until approximately 3 months ago, he  would have one episode every 3 months and this would typically last  anywhere from 6-10 hours. At the onset of the episodes, his heart rate  would beat quite fast in the 150-160 beat per minute range and then  within a few minutes would slow down. Typically he would wait these  out and take an additional beta blocker. He was initially on 25 mg of  Tenormin and now is on 50 mg daily with an additional 50 mg if he goes  into A fib. In the last few months, the episodes have increased in  frequency and now he has had 1-2 episodes per month. He had no other  symptoms today. He continues to remain very active despite his knee  replacements and plays golf regularly and exercises regularly.   PAST MEDICAL HISTORY:  Notable for hypertension. He has a history of  gastroesophageal reflux. He has a history of BPH, he has a history of  osteoarthritis, he has a history of dyslipidemia.   MEDICATIONS:   Prilosec, Lipitor, Flomax, atenolol 50 mg daily with an  additional 50 mg as needed. Singulair, Coumadin, Wellbutrin, Advair and  nasal inhaler.   FAMILY HISTORY:  Notable for a mother with a stroke, brother with  coronary artery disease.   SOCIAL HISTORY:  The patient lives in [place]. He denies tobacco or  ethanol use.   REVIEW OF SYSTEMS:  As noted in the HPI otherwise all systems reviewed  and found to be negative.   PHYSICAL EXAMINATION:  GENERAL:  He is a pleasant, well-appearing, 48-  year-old man in no acute distress.  VITAL SIGNS:  Blood pressure was 110/70, the pulse was 63 and regular,  respirations were 18, the weight was 172 pounds.  HEENT:  Normocephalic, atraumatic. Pupils equal and round. The  oropharynx was moist, sclera anicteric.  NECK:  Revealed no jugular venous distention. There was no thyromegaly,  trachea was midline. The carotids were 2+ and symmetric.  LUNGS:  Clear bilaterally to auscultation. No wheezes, rales or rhonchi  present.  CARDIOVASCULAR:  Revealed a regular rate and rhythm with normal S1 and  S2. No murmurs, rubs or gallops were noted.  ABDOMEN:  Soft, nontender, nondistended. There was no organomegaly.  Bowel sounds were present. No rebound or guarding.  EXTREMITIES:  Demonstrated no cyanosis, clubbing or edema. The pulses  were 2+ and symmetric.  NEUROLOGIC:  Alert and oriented x2 with cranial nerves intact. Strength  was 5/5 and symmetric.   EKG demonstrates sinus rhythm with normal axis and intervals.   IMPRESSION:  1. Paroxysmal atrial fibrillation.  2. Hypertension.  3. Dyslipidemia.  4. Remote coronary disease.   DISCUSSION:  The patient's atrial fibrillation has increased in  frequency and severity in the last several months. Today we talked about  treatment options with the patient in detail. Of course he will stay on  Coumadin. At this point, we talked about continuing to up titrate his  beta blocker and I told him that he  would likely feel very fatigued if  he gets up to 100 mg a day of atenolol though this would be certainly an  option in the future. Secondly, we talked about antiarrhythmic drug  therapy with either sotalol, Tikosyn or amiodarone. At the present time,  I think the amount of A fib that he is having does not warrant treatment  with these drugs and he is not bothered enough I think to warrant  treatment with these medications. Finally, I talked about adding digoxin  for helping with rate control and I think this is a reasonable thing. We  also talked about switching from atenolol to Toprol which may be more  cardio selective. For now will continue him on atenolol and I have given  him a prescription for digoxin 0.125 mg daily. I will see him back in  approximately 3 months to see how he is doing.     [name], MD  Electronically Signed    GWT/MedQ  DD: 06/17/2006  DT: 06/17/2006  Job #: [id number]   cc:   Tinnie Gens A. Tawanna Cooler, MD

## 2010-06-23 NOTE — Assessment & Plan Note (Signed)
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 He had slight inferior ST elevation at that  time with  positive enzymes.  . He had an episode of atrial arrhythmia in the mid 90s and then again  in February 2004 had documented atrial fibrillation after a knee  operation. He is now having symptoms of paroxysmal atrial fibrillation  with rates up to 160 at times, most recently this morning when he was  awakened at 2:30 a.m. He took an extra 50 mg atenolol as I had  instructed and rates slowed and converted about 10:30.   Patient called earlier this week and we had set up an appointment with  Dr. Ladona Ridgel on Friday, however, because he has noticed some brief pain in  his teetduring the rapid atrial fibrillation I asked him to come by  today to see whether he should have a stress test.   Patient exercises regularly and has no symptoms unless he has the  episodes of atrial fibrillation.   MEDICATIONS:  Include;  1. Prilosec.  2. Lipitor 10.  3. Flomax.  4. Atenolol 50 plus 50 p.r.n.  5. Singulair.  6. Coumadin.  7. Wellbutrin.  8. Allegra.  9. Advair 100/50 for recent asthma and pulmonary symptoms.   Patient states that he had abdominal ultrasound February 2004 at [facility].   VITAL SIGNS: Normal.  GENERAL APPEARANCE: Normal.  JVP is not elevated. Carotid pulses are palpable equal without bruits.  LUNGS: Clear.  CARDIAC EXAM: Normal.  ABDOMINAL EXAM: Normal.  EXTREMITIES: Normal.   EKG: Done on June 01, 2006 per Dr. Tawanna Cooler reveals possible old inferior  myocardial infarction.   I have suggested stress test, however, patient would  like to wait on  this for now. He did have negative stress test 2 years ago.   As noted above he will see Dr. Ladona Ridgel on Friday and follow up with Dr.  Ladona Ridgel and possibly Dr. Juanda Chance as well in the future.     Cecil Cranker, MD, [facility]  Electronically Signed    EJL/MedQ  DD: 06/15/2006  DT: 06/15/2006  Job #: [id number]

## 2010-06-23 NOTE — Assessment & Plan Note (Signed)
[facility]                             PULMONARY OFFICE NOTE   NAME:[name] [name], [name] [name]                        MRN:          [id number]  DATE:07/12/2006                            DOB:          12-01-1929    PROBLEM:  1. Asthma.  2. Allergic rhinitis.  3. Episodic urticaria.  4. Paroxysmal atrial fibrillation/chronic Coumadin.  5. Coronary artery disease.  6. Esophageal reflux.   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  He has had a little residual cough, which is  clearing.  Nasal steroids did help when tried previously.  He quit  Singulair as ineffective.  He is now using Pulmicort 2 puffs in the  morning with Advair 100/50 one puff in the evening.  Albuterol rescue  inhaler has been used only twice, rarely needed.  It caused jitters when  he took it, and I discussed the availability of Atrovent as a rescue  inhaler.  He does not want to get additional medications now, and will  watch need for this.  Chest x-ray was done at [facility] about 16 months ago,  and I will check that report, but he does not sense anything different  is going on in his chest.  He was never a smoker.   MEDICATIONS:  1. Prilosec.  2. Lipitor 10 mg.  3. Flomax.  4. Atenolol 50 mg plus an additional 50 mg p.r.n. for rare episodes of      tachy palpitations.  5. Coumadin.  6. Wellbutrin.  7. Allegra.  8. Advair 100/50 in the evening.  9. Pulmicort 2 puffs q.a.m.  10.Fluticasone nasal spray at intervals when needed.  11.Occasional Mucinex.  12.Rescue albuterol.   NO MEDICATION ALLERGY.   OBJECTIVE:  Weight 167 pounds.  BP 126/62.  Pulse 60.  Room air  saturation 99%.  Pulse is very regular.  I do not hear a murmur.  There is no neck vein distention or stridor.  His nasal airway is not obstructed.  Voice quality is a little raspy  with comfortable speech and  full sentences.  LUNG FIELDS:  Clear.  No peripheral edema.   IMPRESSION:  Asthma and rhinitis are clinically stable.  He does need to  continue watching for reflux, and following reflux precautions.  Recognized potential cardiac stimulation from available respiratory  medications, which we have discussed.   PLAN:  He is satisfied with present therapy, and for now will return in  1 year, but earlier p.r.n.     Clinton D. Maple Hudson, MD, [facility] Bollman, FACP  Electronically Signed    CDY/MedQ  DD: 07/12/2006  DT: 07/12/2006  Job #: [id number]   cc:   Tinnie Gens A. Tawanna Cooler, MD

## 2010-06-23 NOTE — Assessment & Plan Note (Signed)
[facility]                         ELECTROPHYSIOLOGY OFFICE NOTE   NAME:[name] [name], [name] [name]                        MRN:          [id number]  DATE:04/17/2007                            DOB:          [birth year]    Dr. 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  Since we started him on the digoxin, he has done well.  He notes that he has been in atrial fibrillation at most three times in  the last 6 months.  All of these been short-lived, lasting typically  less than an hour.  He continues to be very active, playing golf on a  regular basis.  He had no specific complaints today.   PHYSICAL EXAM:  He is a pleasant, well-appearing man in no distress.  Blood pressure was 131/73, the pulse 63 and regular, respirations were  18.  The weight was 170 pounds.  NECK:  No jugular venous distention.  LUNGS:  Clear bilaterally to auscultation.  No wheezes, rales or rhonchi  were present.  CARDIOVASCULAR:  A regular rate and rhythm with a normal S1 and S2.  EXTREMITIES:  No edema.   MEDICATIONS:  1. Prilosec one or two tablets daily.  2. Lipitor 10 mg daily.  3. Flomax p.r.n.  4. Atenolol 50 mg a day and 50 mg p.r.n.  5. Coumadin as directed.  6. Wellbutrin.  7. Digoxin 0.125 mg daily.  8. Advair 10/50 mcg.   EKG today demonstrates sinus rhythm with a nonspecific T-wave  abnormality.   IMPRESSION:  1. Paroxysmal atrial fibrillation.  2. Asymptomatic sinus bradycardia.  3. Borderline hypertension.  4. Remote coronary disease.   DISCUSSION:  Overall, Dr. Sherrie [name] is stable and he is maintaining sinus  rhythm very nicely.  We will plan to see him back in the office in 1  year, sooner if his atrial  arrhythmias worsen.     [name], MD  Electronically Signed    GWT/MedQ  DD: 04/17/2007  DT: 04/18/2007  Job #: [id number]   cc:   Tinnie Gens A. Tawanna Cooler, MD

## 2010-06-23 NOTE — Assessment & Plan Note (Signed)
[facility]                         ELECTROPHYSIOLOGY OFFICE NOTE   NAME:[name], [name] DIFIORE                        MRN:          [id number]  DATE:10/17/2006                            DOB:          1929/11/01    Dr. 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  He is a very pleasant 77-year-  old man with a history of coronary disease and a history of symptomatic  paroxysmal atrial fibrillation who returns today for follow-up.  Since I  saw him last several months ago, he has had no recurrent atrial  arrhythmias and denies chest pain or shortness of breath.  He states  that he feels well.   MEDICATIONS:  1. Prilosec.  2. Lipitor.  3. Flomax.  4. Atenolol 50 a day and 50 p.r.n.  5. He also was on digoxin 0.125 mg daily.  6. Coumadin.  7. Wellbutrin.  8. Advair.  9. Pulmicort.   PHYSICAL EXAMINATION:  VITAL SIGNS:  Blood pressure 102/70, pulse 74 and  regular, respirations 16, weight 170 pounds.  GENERAL APPEARANCE:  He is a pleasant, well-appearing 77 year old man in  no distress.  NECK:  No jugular venous distension.  LUNGS:  Clear to auscultation bilaterally.  No wheezing, rhonchi or  rales were present.  CARDIOVASCULAR:  Regular rate and rhythm with normal S1 and S2.  No  murmurs, rubs, or gallops.  EXTREMITIES:  No clubbing, cyanosis, or edema.  The pulses were 2+ and  symmetric.   The EKG demonstrates sinus bradycardia with normal axis and intervals.   IMPRESSION:  1. Paroxysmal atrial fibrillation.  2. Asymptomatic bradycardia.  3. Coronary disease, presently stable.   DISCUSSION:  Overall, Dr. Sherrie [name] is stable.  He is tolerating his  medications very nicely and has maintained sinus rhythm at least without  symptoms.  I will plan to see him back in the office in six months.  No  other medicine changes today.     [name] [name]. [name] Ridgel, MD  Electronically Signed    GWT/MedQ  DD: 10/17/2006  DT: 10/18/2006  Job #: [id number]   cc:   [name] Gens A.  Tawanna Cooler, MD

## 2010-06-26 NOTE — H&P (Signed)
NAME:  [name] [name], BART NO.:  [id number]   MEDICAL RECORD NO.:  [id number]                   PATIENT TYPE:  INP   LOCATION:  3104                                 FACILITY:  [facility]   PHYSICIAN:  Heloise Beecham, M.D. [facility]          DATE OF BIRTH:  [birth year]   DATE OF ADMISSION:  04/29/2002  DATE OF DISCHARGE:  04/30/2002                                HISTORY & PHYSICAL   PRIMARY PHYSICIAN:  Cardiologist, Cecil Cranker, M.D.  Primary care, Eugenio Hoes. Tawanna Cooler, M.D.  Orthopedics, Ollen Gross, M.D.   CHIEF COMPLAINT:  Palpitations.   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  tonight.  The patient had a left total knee replacement about four weeks ago  complicated by postop atrial fibrillation.  He has recovered extremely well  from his knee replacement and was previously placed on Coumadin and a beta  blocker with good results.  At the time of his postop atrial fibrillation,  his hemoglobin was 9 to 10.  He has done well since and self discontinued  his Coumadin about one week ago.  He played golf yesterday and today and was  extremely tired tonight when he noticed palpitations and found that his  heart rate was approximately 150.  At the time of exam, the patient has no  symptoms of palpitations, chest pain, shortness of breath, nausea, vomiting,  diaphoresis, etc., but has a heart rate of 140 to 150 on the monitor.  Before coming into the ER this evening, he took 25 mg of Tenormin.   PAST MEDICAL HISTORY:  1. Catheterization in 1984.  The patient had a 30% left circumflex.  RCA was     not cannulated and was not injected.  The patient had a Cardiolite in     Februayr2004 with an EF of 51% and no ischemia.  2. Hiatal hernia.  3. Right total knee replacement in 1993.  4. GERD status post vagotomy in pyloroplasty in 1985.  5. BPH.  6.  Osteoarthritis.  7. Cholecystectomy in 1985.  8. Hyperlipidemia.   HOME MEDICATIONS:  1. Prevacid 30 mg daily.  2. Tenormin 25 mg daily.  3. Xanax 0.25 mg p.o. p.r.n.  4. Altace 10 mg daily.  5. Lipitor 10 mg q.h.s.  6. Wellbutrin SR 150 mg daily.  7. Percocet 2 tablets q.h.s. for knee pain.  8. Restoril 15 mg q.h.s.  9. Flomax 0.4 mg daily.  10.      The patient was previously taking Coumadin at alternating doses of     5 mg and 2.5 mg q.h.s.   SOCIAL HISTORY:  The patient lives in [place] with his wife of three  years.  He is a retired Marine scientist.  He has two daughters who are living.  No tobacco, no alcohol, no drugs.  He enjoys golf but is limited in exercise  by his knee problems.   FAMILY HISTORY:  His mother had a CVA.  His brother is alive but has had  CAD.  All other Review of Systems negative with regard to constitutional,  HEENT, skin, cardiopulmonary, GU, musculoskeletal, GI, endocrine.  All other  systems are negative.   PHYSICAL EXAMINATION:  VITAL SIGNS:  The patient is afebrile.  Pulse 136,  blood pressure 180/70, respirations 20, O2 saturation 97% on 2 liters.  HEENT:  Normocephalic and atraumatic.  PERRLA.  EOMI.  Mucous membranes are  wet.  Oropharynx and oral cavity are clear.  NECK:  Supple.  No bruits, no JVD, 2+ carotid pulses, no lymphadenopathy.  CARDIOVASCULAR:  Tachycardia with a 2/6 systolic ejection murmur at the left  lower sternal border.  No S3 or S4.  LUNGS:  Clear to auscultation bilaterally.  No wheezes, rubs, or rhonchi.  SKIN:  No rash or lesions.  ABDOMEN:  Soft, nontender, nondistended.  No rebound or guarding.  EXTREMITIES:  No cyanosis, clubbing, or edema.  His left knee incision is  healing well with no drainage.  There is 2+ left lower extremity edema to  the knee.  NEUROLOGIC:  Alert and oriented x 3.  All other components of neurologic  exam are intact.   LABORATORY DATA:  Chest x-ray is pending.   EKG shows a rate of 144  with atrial flutter.  No ST or T wave changes and no  Q waves.   Multiple labs are currently pending; however, his white count is currently  6.8, hemoglobin 11.7, hematocrit 35, platelets 243.  PTT 26, PT 13.8, INR  1.0.   ASSESSMENT/PLAN:  The patient is a very pleasant 77 year old man who is  known well to [facility] with a normal ejection fraction,  hyperlipidemia, osteoarthritis with a recent left total knee replacement  complicated by postop atrial fibrillation.  He recently stopped his Coumadin  and currently has a recurrence of atrial flutter/fibrillation with minimal  symptoms.  The patient received 10 mg metoprolol IV in the ER followed by 10  mg Diltiazem and converted to normal sinus rhythm.  His EKG currently is  normal.   1. Atrial flutter.  We will start Lovenox, and I will not start a Diltiazem     drip at the present time since his heart rate is in the 60s and his blood     pressure is well controlled.  We will rule out myocardial infarction with     serial enzymes.  The case was discussed with Dr. Corinda Gubler who mentioned     possible flutter ablation.  I will hold on Coumadin for now until we     discuss this with EP; however, the patient is still in the postoperative     period with a recovery hemoglobin/hematocrit, and it might be that long     term his risk of recurrence of atrial fibrillation/flutter will be low.  2. Lower extremity edema.  Bilateral lower extremity ultrasound to rule out     deep vein thrombosis will be done in the morning.  3. Total knee replacement.  Followup will be arranged with orthopedics.     Otherwise, we will continue Percocet p.r.n. for pain.  4. Check a thyroid-stimulating hormone.  5.     Check lipids.  6. Gastroesophageal reflux disease.  We will continue his Prevacid.  7. History of coronary artery disease.  We will continue his metoprolol,  Altace, Lipitor, etc.                                               Heloise Beecham, M.D. [facility]    DWM/MEDQ  D:  04/29/2002  T:  04/30/2002  Job:  [id number]

## 2010-06-26 NOTE — Assessment & Plan Note (Signed)
[facility]                             PULMONARY OFFICE NOTE   NAME:Runquist, MERDITH BOYD                        MRN:          [id number]  DATE:01/13/2006                            DOB:          Apr 01, 1929    PROBLEM LIST:  1. Asthma.  2. Allergic rhinitis.  3. Episodic urticaria.  4. Chronic Coumadin for paroxysmal atrial fibrillation, now in sinus      rhythm.  5. Esophageal reflux.   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  He is  putting casings on his pillow and mattress and has a non-allergenic down  comforter.  He sneezes after supper while sitting in a recliner.  He  mentioned staying [place] an in[place].  This  home is described as musty and he says he does get more nasal congestion  there.  Taking Prilosec b.i.d. did not help with cough.  He had a CT of  the sinus in August which had shown mild to moderate chronic bilateral  frontal ethmoid and maxillary sinusitis.  He had seen Dr. Charolotte Eke  in the past and is not prepared to discuss a surgical option now.  He  had been given a sample of Advair 100/50 and is using that once daily.   MEDICATIONS:  1. Prilosec b.i.d.  2. Lipitor.  3. Flomax.  4. Atenolol.  5. Singulair.  6. Coumadin.  7. Wellbutrin.  8. Allegra.  9. Advair 100/50.  10.Fluticasone nasal spray.  11.Mucinex.   NO MEDICATION ALLERGIES.   OBJECTIVE:  VITAL SIGNS:  Weight 167 pounds, BP 116/82, pulse regular  56, room air saturation 98%.  HEENT:  Narrow nasal airway.  No apparent drainage or adenopathy.  LUNGS:  There is mild wheeze bilaterally on forced expiration.   IMPRESSION:  1. Chronic mild asthma.  2. Chronic rhinosinusitis.  3. Allergic rhinitis.  4. Esophageal reflux.  5. History of atrial fibrillation.   PLAN:  1. He will continue Prilosec once or twice daily.  2. He understands to increase Advair from once to twice daily at the      first sign of  any exacerbation in his chest.  3. Samples of Nasacort AQ.  4. Consider __________  off of antihistamines for skin testing.  5. As tolerated by his heart rhythm, we will look at decongestants.  6. We have discussed saline lavage.  7. He needs to consider whether surgical re-evaluation would be      appropriate for his nasal complaint.     Clinton D. Maple Hudson, MD, Tonny Bollman, FACP  Electronically Signed    CDY/MedQ  DD: 01/14/2006  DT: 01/14/2006  Job #: [id number]   cc:   Tinnie Gens A. Tawanna Cooler, MD

## 2010-06-26 NOTE — Op Note (Signed)
NAME:  [name] [name], [name] [name] NO.:  [id number]   MEDICAL RECORD NO.:  [id number]                   PATIENT TYPE:  INP   LOCATION:  0009                                 FACILITY:  [facility]   PHYSICIAN:  Ollen Gross, M.D.                 DATE OF BIRTH:  1929/03/16   DATE OF PROCEDURE:  04/02/2002  DATE OF DISCHARGE:                                 OPERATIVE REPORT   PREOPERATIVE DIAGNOSIS:  Osteoarthritis, left knee.   POSTOPERATIVE DIAGNOSIS:  Osteoarthritis, left knee.   PROCEDURE:  Left total knee arthroplasty.   SURGEON:  Ollen Gross, M.D.   ASSISTANT:  Alexzandrew L. Julien Girt, P.A.   ANESTHESIA:  Spinal.   ESTIMATED BLOOD LOSS:  Minimal.   DRAINS:  Hemovac x1.   TOURNIQUET TIME:  51 minutes at 300 mmHg.   COMPLICATIONS:  None.   CONDITION:  Stable to recovery.   BRIEF CLINICAL NOTE:  The patient is a 77 year old male who has severe end-  stage arthritis of his left knee with pain refractory to nonoperative  management.  He has had a previous successful right total knee arthroplasty  and presents now for left total knee arthroplasty.   DESCRIPTION OF PROCEDURE:  After successful administration of spinal  anesthetic, a tourniquet is placed high on the left thigh and the left lower  extremity prepped and draped in the usual sterile fashion.  The extremity is  wrapped in an Esmarch, the knee flexed, and the tourniquet inflated to 300  mmHg.  Standard midline incision is made with a 10 blade through the  subcutaneous tissue to the level of the extensor mechanism.  A fresh blade  is used to make a medial parapatellar arthrotomy, and the soft tissue in the  proximal medial tibia is subperiosteally elevated from the joint with a  knife into the semimembranous bursa with a curved osteotome.  Soft tissue  over the proximal lateral tibia is also elevated with attention being paid  to avoiding the patellar tendon on the tibial tubercle.  The patella  is then  everted and the knee flexed 90 degrees, the ACL and PCL removed.  The drill  is then used to create a starting hole in the distal femur and the 5 degree  left valgus alignment guide placed.  Referencing off the posterior condyles,  rotation is marked and a block pinned to remove 10 mm off the distal femur.  Distal femoral resection is then made with an oscillating saw.  The sizing  block is placed and size 5 is most appropriate.  Rotation corresponds with  the epicondylar axis.  AP block is placed and then the anterior and  posterior cuts are made.   The tibia is then subluxed forward and the menisci removed.  Extramedullary  tibial alignment guide is placed, referencing proximally at the medial  aspect of the tibial tubercle and distally on  the second metatarsal axis of  the tibial crest.  The block is pinned to remove 10 mm off the nondeficient  lateral side.  Tibial resection is made with an oscillating saw.  The size 5  tibia is placed up on the cut bony surface and then the proximal tibia  prepared with the modular drill and keel punch.  Femoral preparation is then  completed with the intercondylar and chamfer cuts.   A size 5 posterior-stabilized femoral trial with a size 5 mobile-bearing  tibial trial and a 10 mm posterior-stabilized rotating platform trial are  placed.  Full extension is achieved with excellent varus and valgus balance  throughout, full range of motion.  The patella is then everted and the  thickness measured to be 28 mm.  Free-hand resection taken to 16 mm.  The 41  template is placed, lug holes drilled, trial patella placed, and it tracks  normally.  The osteophytes are then removed off the posterior femur with the  trials in place.   The cut bony surfaces are prepared with pulsatile lavage and the cement  mixed.  Once ready for implantation, the size 5 mobile-bearing tibial tray,  size 5 posterior-stabilized femur, and 41 patella are cemented into  place.  A trial 10 mm insert is placed and the knee held in full extension and all  extruded cement removed.  Once the cement is fully hardened, then the  permanent 10 mm posterior-stabilized rotating platform insert is placed into  the tibial tray.  The knee is reduced and there is excellent stability  throughout full range of motion.  The wound is then copiously irrigated with  antibiotic solution and the extensor mechanism closed over a Hemovac drain  with interrupted #1 PDS.  Flexion against gravity is 135 degrees.  The  tourniquet is released with a total time of 51 minutes.  The subcutaneous  tissue is closed with interrupted 2-0 Vicryl, subcuticular running 4-0  Monocryl.  The incision is cleaned and dried and Steri-Strips and a bulky  sterile dressing applied.  The patient is then awakened and transported to  recovery in stable condition.                                               Ollen Gross, M.D.    FA/MEDQ  D:  04/02/2002  T:  04/02/2002  Job:  [id number]

## 2010-06-26 NOTE — Assessment & Plan Note (Signed)
[facility]                               PULMONARY OFFICE NOTE   NAME:[name] [name] MCGUE                        MRN:          [id number]  DATE:12/02/2005                            DOB:          1929-07-25    PROBLEM:  1. Asthma.  2. Allergic rhinitis.  3. Episodic urticaria.  4. Chronic Coumadin for paroxysmal atrial fibrillation, now in sinus      rhythm.  5. Esophageal reflux.   HISTORY:  He has had flu and pneumococcal vaccines.  He still complains that  he gets a lot of mucus from his nose and finds himself mouth breathing, and  with bothersome postnasal drip.  Avelox did not help.  Occasional minimal  shortness of breath, mostly after coughing.  He continues to exercise.  He  has been doing saline lavage and using b.i.d. Allegra.  He does not  recognize active heartburn, but I do not think he has been watching for  bland reflux.  He realizes he has a narrow nasal airway, and has seen Dr.  Charolotte Eke.   MEDICATIONS:  1. Prilosec.  2. Lipitor.  3. Flomax.  4. Atenolol.  5. Singulair.  6. Coumadin.  7. Wellbutrin.  8. Allegra, either 60 or 180 mg.  9. Advair 100/50 b.i.d.  10.Mucinex.   ALLERGIES:  No medication allergy.   OBJECTIVE:  VITAL SIGNS:  Weight 167 pounds.  Blood pressure 122/80, pulse  feels regular at 65.  Room air saturation of 94%.  HEENT:  He has nasal tip ptosis, red throat without exudate or visible  drainage.  Voice quality is unremarkable.  There is no stridor, no neck vein  distention.  CHEST:  Quiet.  I do not hear rales, wheeze or cough.  HEART:  Heart sounds are regular currently.  I do not hear a murmur.   IMPRESSION:  1. Mild asthma.  2. Rhinitis with CT scan on August 1, demonstrating mild to moderate      chronic bilateral frontal ethmoid and maxillary sinusitis.  3. Esophageal reflux.   PLAN:  1. Try increasing Prilosec to b.i.d. for at least 2-3 weeks.  2. Depo-Medrol 80 mg IM given on a trial  basis today.  3. Samples of Xyzal 5 mg daily as an antihistamine for a trial after he      has had time to watch the other interventions work.  4. Schedule return in 6 weeks for followup; earlier p.r.n.  5. He will discuss options in regards to his capillary refill sinusitis      with Dr. Charolotte Eke, although I think this is more chronic post      inflammatory change now than actively infected.     Clinton D. Maple Hudson, MD, FCCP, FACP    CDY/MedQ  DD: 12/04/2005  DT: 12/06/2005  Job #: [id number]   cc:   Tinnie Gens A. Tawanna Cooler, MD  Joanna Puff, M.D.

## 2010-06-26 NOTE — Assessment & Plan Note (Signed)
[facility]                               PULMONARY OFFICE NOTE   NAME:[name] [name], [name] [name]                        MRN:          [id number]  DATE:10/15/2005                            DOB:          1929-08-26    PROBLEM:  1. Mild asthma.  2. Allergic rhinitis.  3. Episodic urticaria.  4. On Coumadin for paroxysmal atrial fibrillation, now in sinus rhythm.   HISTORY:  Occasional mild chest congestion without wheezing. He saw Dr.  Oren Section for hoarseness and postnasal drip. A CT scan of the  sinuses on August 13 described mild to moderate chronic sinusitis, moderate  to marked bilateral nasal turbinate mucosal thickening compatible with  allergic rhinitis. He tried a prednisone dosepak and amoxicillin. He is on a  probiotic. He still has a sense of nasal congestion and is aware that he has  always had a narrow nose.   MEDICATIONS:  Prilosec, Lipitor, Flomax, atenolol, Singulair, Coumadin,  Wellbutrin, Allegra, Advair 100/50.   No medication allergy.   OBJECTIVE:  Weight 171 pounds. BP 122/64. Pulse regular 66. Room air  saturation 97%.  He seems comfortable. Conjunctivae are not injected and  there is no periorbital edema. He has a narrow nasal airway without visible  drainage or polyps. Lung fields are clear.   IMPRESSION:  1. Allergic rhinitis and mild chronic bilateral sinusitis.  2. Minimal asthma for which we may need to get a PFT.   PLAN:  1. We are going to try conservative measures. He is going to try [facility] strips.  2. Continue fluticasone.  3. Try nasal saline lavage regularly b.i.d.  4. Try Avelox 400 mg b.i.d. for two weeks.  5. Try Mucinex and schedule return three weeks, earlier p.r.n.                                  Clinton D. Maple Hudson, MD, FCCP, FACP   CDY/MedQ  DD:  10/18/2005  DT:  10/19/2005  Job #:  [id number]   cc:   Tinnie Gens A. Tawanna Cooler, MD

## 2010-06-26 NOTE — Discharge Summary (Signed)
NAME:  [name] [name], [name] [name] NO.:  [id number]   MEDICAL RECORD NO.:  [id number]                   PATIENT TYPE:  INP   LOCATION:  3104                                 FACILITY:  [facility]   PHYSICIAN:  Cecil Cranker, M.D. [facility]         DATE OF BIRTH:  1929/03/30   DATE OF ADMISSION:  04/29/2002  DATE OF DISCHARGE:  04/30/2002                                 DISCHARGE SUMMARY   DISCHARGE DIAGNOSES:  1. Paroxysmal atrial flutter converted to normal sinus rhythm this     admission.  2. Status post left total knee replacement in late February.  3. Left lower extremity edema - bilateral lower extremity ultrasound pending     at the time of this dictation.  4. Hyperlipidemia.  5. Osteoarthritis.  6. Gastroesophageal reflux disease.   HOSPITAL COURSE:  Please see the admission history and physical dictated by  Dr. Heloise Beecham for complete details.  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  He had  had left total knee replacement four weeks prior complicated by  postoperative atrial fibrillation.  In the emergency room his ECG revealed  atrial flutter with rapid ventricular rate of 144.  He was treated with beta  blockers in the emergency room with minimal response.  He was admitted to a  telemetry bed and continued on beta blockers.  He was started on Lovenox.  He had stopped his Coumadin one week prior to this admission.  On the  morning of April 30, 2002 Dr. Ladona Ridgel saw the patient after discussions with  Dr. Corinda Gubler.  Telemetry revealed he was in normal sinus rhythm.  Dr. Ladona Ridgel  felt the patient could be discharged to home and Coumadin would be resumed.  As noted above, the bilateral lower extremity ultrasound is pending at the  time of this dictation.  If this is positive, the patient will go home on  Lovenox for three days with Coumadin overlap.  He will continue on his  regular dose of atenolol  and will follow up with our Coumadin Clinic this  week, Dr. Corinda Gubler in two weeks and Dr. Ladona Ridgel in three months.   LABORATORY DATA:  White blood cell count 6800, hemoglobin 11.7, hematocrit  35.1, MCV 89, platelet count 243,000.  INR of 1.0.  Sodium 140, potassium  4.3, chloride 107, CO2 29, glucose 135, BUN 15, creatinine 1.1, calcium 8.5.  AST 21, ALT 22, total protein 6.2, albumin 3.4, total bilirubin 0.6,  alkaline phosphatase 89.  Cardiac enzymes negative x 2.  Lipid panel,  hemoglobin A1C and TSH level all pending at the time of this dictation.   DISCHARGE MEDICATIONS:  1. Coumadin 5 mg q.d. x3 days and then alternate 2.5 mg q.o.d. with 5 mg.  2. Atenolol 25 mg q.d.  3. Prevacid 30 mg q.d.  4. Xanax p.r.n.  5. Altace 2 mg q.d.  6.  Lipitor 10 mg q.h.s.  7. Wellbutrin SR 150 mg q.d.  8. Percocet p.r.n.  9. Restoril p.r.n.  10.      Flomax 0.4 mg q.d.   ACTIVITY:  As tolerated.   DIET:  Low fat, low sodium.   INSTRUCTIONS:  The patient is to contact our office with any questions or  concerns.   FOLLOW UP:  He will see our Coumadin Clinic on Thursday, May 03, 2002 at  12:15, Dr. Corinda Gubler on May 21, 2002 at 11:45 and he will see Dr. Ladona Ridgel in  three months and the office will call him with appointment.   SPECIAL NOTE:  Ultrasound is just finished from the patient's bilateral  lower extremity ultrasound.  This is negative for DVT.  This is a  preliminary report.  As noted above TSH is still pending.     Tereso Newcomer, Anders Simmonds, M.D. [facility]    SW/MEDQ  D:  04/30/2002  T:  04/30/2002  Job:  [id number]   cc:   Tinnie Gens A. Tawanna Cooler, M.D. [facility]   Bea Laura Graceann Congress, M.D. [facility]   Ollen Gross, M.D.  [address]  [place]  [place] [address]  Fax: [phone]   [name], M.D. [facility]

## 2010-06-26 NOTE — H&P (Signed)
NAME:  [name] [name], ROSNER                           ACCOUNT NO.:  [id number]   MEDICAL RECORD NO.:  [id number]                   PATIENT TYPE:  INP   LOCATION:  1832                                 FACILITY:  [facility]   PHYSICIAN:  Olga Millers, M.D. 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 The  patient does have a prior cardiac history of myocardial infarction in 1984.  A catheterization at that time revealed a 30% circumflex. His right coronary  artery could not be engaged. He has been treated medically since then and  has had negative nuclear studies. He recently had a preoperative Cardiolite  on March 27, 2002, which showed an ejection fraction of 61% and no  ischemia or infarction. He denies any typical dyspnea on exertion,  orthopnea, PND, palpitations, presyncope, syncope or chest pain.   The patient underwent knee replacement on Monday of this week. He did well  postoperatively and was discharged to home this morning. Of note his vital  signs prior to discharge showed a pulse in the 78 to 84 range. Also of note  his INR at discharge this morning was 2.2.   At approximately 3 p.m. he developed palpitations associated with mild  shortness of breath and  mild chest tightness. He took his pulse and it was  noted to be elevated. He presented to the emergency room and was found to be  in atrial fibrillation with rapid ventricular response. He received 1 dose  of Cardizem and is now asymptomatic.   MEDICATIONS:  His medications at home include:  1. Coumadin.  2. Tenormin 12.5 mg p.o. every day.  3. Altace 10 mg p.o. every day.  4. Lipitor 10 mg p.o. every day.  5. Flomax 0.4 mg p.o. every day.  6. Wellbutrin 150 mg p.o. every day.  7. Prevacid 30 mg p.o. every day.   ALLERGIES:  No known drug allergies.   SOCIAL HISTORY:  He does smoke nor does he consume alcohol.   FAMILY HISTORY:  His family history is remarkable for coronary artery  disease, as he does have a brother who has had a prior CABG.   PAST MEDICAL HISTORY:  There is no diabetes mellitus. He has had borderline  hypertension in the past by his report. There is hyperlipidemia. He has  coronary artery disease as outlined above in the HPI. He has had peptic  ulcer disease and is status post a vagotomy and pyloroplasty. He has had a  prior cholecystectomy and he has had bilateral knee replacement. He has a  history of benign prostatic hypertrophy.   REVIEW OF SYSTEMS:  There is no headache, fever or chills. There  is no  productive cough or hemoptysis. There is no dysphagia, odynophagia, melena  or hematochezia. There is no dysuria or hematuria. There is no rash. No  seizure activity. No orthopnea, PND or pedal edema. The remainder of the  systems are negative.   PHYSICAL EXAMINATION:  VITAL SIGNS:  Blood pressure 110/70, pulse 110,  afebrile.  GENERAL:  He is well developed, well nourished in no acute distress. He does  not appear to be depressed.  SKIN:  Warm and dry.  HEENT:  Unremarkable with normal eyelids.  NECK:  Within normal limits bilaterally with no bruits noted. No jugular  venous  distention or thyromegaly noted.  CHEST:  Clear to auscultation and percussion.  CARDIOVASCULAR:  Irregular rhythm. There are no murmurs, rubs, gallops  noted. His rate is tachycardic.  ABDOMEN:  Nontender, nondistended, positive bowel sounds, no  hepatosplenomegaly or masses appreciated. No abdominal bruits.  EXTREMITIES:  Digits have no clubbing. He has 2+ femoral pulses bilaterally  and no bruits. His left lower extremity is in a brace from his recent  surgery. He has also had previous right knee replacement. His extremities  show no edema. I can palpate no  cords on the right. He has 2+ dorsalis pedis  pulses bilaterally.  NEUROLOGIC:  Examination was grossly intact.   LABORATORY DATA:  His electrocardiogram shows atrial fibrillation with a  rapid ventricular response of 149. There are nonspecific ST changes.  His  chest x-ray shows no active disease.   The patient's hemoglobin is 10.1, hematocrit 29.1, platelet count 156. BUN  19, creatinine, 1.0, potassium 3.9. INR 2.0.   DIAGNOSES:  1. New onset atrial fibrillation.  2. Status post knee replacement.  3. History of coronary artery disease with recent negative nuclear study.  4. Hyperlipidemia.  5. Borderline hypertension previously.  6. History of peptic ulcer disease.   PLAN:  The patient presents with new onset atrial fibrillation. He is now 4  days postoperative from a knee replacement and I think this is most likely  related to the hyperadrenergic state associated with this surgery. We will  admit to telemetry and control his heart rate with Cardizem. We will hold  his remaining blood pressure medications at this point as his blood pressure  is somewhat borderline. We will cycle enzymes, although I doubt ischemia. If  they are negative, then I do not think we need to pursue further ischemia  evaluation, as he did have a recent negative nuclear study. We will check a  TSH as well. We will also continue with his Coumadin. If he does not convert  overnight, then we will proceed with elective cardioversion  tomorrow  morning. It should be noted that the duration of his atrial fibrillation is  less than  24 hours and his INR is therapeutic at present.                                               Olga Millers, M.D. [facility]    BC/MEDQ  D:  04/05/2002  T:  04/05/2002  Job:  [id number]

## 2010-06-26 NOTE — H&P (Signed)
NAME:  [name] [name], [name] [name]                           ACCOUNT NO.:  [id number]   MEDICAL RECORD NO.:  [id number]                   PATIENT TYPE:  INP   LOCATION:  NA                                   FACILITY:  [facility]   PHYSICIAN:  Ollen Gross, M.D.                 DATE OF BIRTH:  Feb 23, 1929   DATE OF ADMISSION:  04/02/2002  DATE OF DISCHARGE:                                HISTORY & PHYSICAL   CHIEF COMPLAINT:  Left knee pain.   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 Ollen Gross for ongoing left knee pain.  He has  known knee arthritis.  He had previously undergone a right total knee  replacement back in 1998, and occasionally will have some slight stiffness  with it, but overall he has done very well with his previous total knee  replacement.  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  He  states that his knee has been progressive, and due to the fact that he has  done well with his right knee, he has reached the point where he would like  to proceed and have surgery concerning the left knee.  He has been seen and  evaluated by Dr. Lequita Halt and has elected to proceed with surgery.   ALLERGIES:  No known drug allergies.   CURRENT MEDICATIONS:  1. Tenormin 12.5 mg daily.  2. Altace 10 mg daily.  3. Lipitor 10 mg daily.  4. Flomax 0.4 mg daily.  5. Wellbutrin 150 mg daily.  6. Prevacid 30 mg daily.  (The patient has stopped his aspirin and anti-inflammatories  preoperatively).   PAST MEDICAL HISTORY:  Myocardial infarction in 1984, hiatal hernia, benign  prostatic hypertrophy, arthritis.  Also previous history with his past right  total knee replacement.   PAST SURGICAL HISTORY:  Vagotomy and pyloroplasty in 1985, cholecystectomy  in 1988.  He had a right total knee replacement in 1998.  He also underwent  a cardiac catheterization  associated with his MI in 1984.  He was found to  have a hypoplasty of the right coronary but it did not require stenting.  He  has recently undergone a Cardiolite stress test.   SOCIAL HISTORY:  Married.  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  He has two children.  His wife will be assisting with his  care after surgery.  Lives in a three story home with two sets of stairs.   FAMILY HISTORY:  Brother with heart disease and CABG.  His mother with a  history of stroke.   REVIEW OF SYSTEMS:  GENERAL:  No fever, chills, or night sweats.  NEUROLOGIC:  No seizures or paralysis.  RESPIRATORY:  No shortness of  breath, productive cough, or hemoptysis.  CARDIOVASCULAR:  No chest pain,  angina, orthopnea.  GI:  No nausea, vomiting, diarrhea, constipation.  He  does have a hiatal hernia.  No blood or mucous in the stool.  GU:  Some  complaints of weak stream due to his benign prostatic hypertrophy.  No  dysuria, hematuria, discharge.  MUSCULOSKELETAL:  Pertinent for left knee  found in the history of present illness.   PHYSICAL EXAMINATION:  VITAL SIGNS:  Pulse 60, respirations 12, blood  pressure 120/84.  GENERAL:  The patient is a 77 year old white male well-nourished, well-  developed, who appears in no acute distress.  Tall, thin frame.  Alert,  oriented, and cooperative.  Very pleasant at the time of exam.  Appears to  be an excellent historian.  HEENT:  Normocephalic and atraumatic.  Pupils equal, round and reactive.  Extraocular muscles are intact.  NECK:  Supple.  CHEST:  Clear to auscultation anterior and posterior chest wall.  There are  no rhonchi, rales, or wheezing.  HEART:  Regular rate, regular rhythm.  There is a faint early systolic  ejection murmur noted, graded 2/6, best auscultated over the aortic point.  No rubs, thrills, palpitations.  ABDOMEN:  Soft, nontender.  Bowel sounds present.  RECTAL/BREASTS/GENITALIA:  Not done.  Not pertinent to  present illness.  EXTREMITIES:  Limited to the left lower extremity.  Skin is intact.  The  patient does have crepitus noted, especially of the medial compartment with  passive range of motion.  He is tender over the medial joint line.  The knee  is stable with varus and valgus stressing.  Motor function is intact.   IMPRESSION:  1. Left knee osteoarthritis.  2. History of myocardial infarction in 1984.  3. Hiatal hernia.  4. Benign prostatic hypertrophy.  5. History of postoperative anemia.  6. Status post right total knee replacement arthroplasty in 1998.   PLAN:  The patient will be admitted to [facility].  He will  undergo a left total knee replacement arthroplasty.  The patient's physician  is Dr. Tawanna Cooler.  Dr. Tawanna Cooler will be notified of the room number on admission and  will be consulted if needed for any medical assistance with this patient  throughout the hospital course.     Alexzandrew L. Julien Girt, P.A.              Ollen Gross, M.D.    ALP/MEDQ  D:  03/29/2002  T:  03/30/2002  Job:  [id number]   cc:   Tinnie Gens A. Tawanna Cooler, M.D. [facility]   Ollen Gross, M.D.  [address]  [place]  [place] [address]  Fax: [phone]

## 2010-06-26 NOTE — Discharge Summary (Signed)
   NAME:  [name] [name], [name] [name]                           ACCOUNT NO.:  [id number]   MEDICAL RECORD NO.:  [id number]                   PATIENT TYPE:  INP   LOCATION:  6522                                 FACILITY:  [facility]   PHYSICIAN:  Olga Millers, M.D. [facility]            DATE OF BIRTH:  March 04, 1929   DATE OF ADMISSION:  04/05/2002  DATE OF DISCHARGE:  04/06/2002                           DISCHARGE SUMMARY - REFERRING   DISCHARGE DIAGNOSES:  1. Atrial fibrillation now in normal sinus rhythm.  2. Long-term Coumadin use.  3. Status post right knee replacement in 1998 with recent left knee     replacement February 2004.  4. Known coronary artery disease, history of myocardial infarction in 1984.  5. Hyperlipidemia, treated.  6. Status post cholecystectomy.  7. Hypertension treated.   HOSPITAL COURSE:  The patient is a 77 year old patient of Dr. Glennon Hamilton  who was admitted to [facility] on April 05, 2002,  with palpitations and mild chest tightness associated with dyspnea.  He was  found to be in atrial fibrillation with RVR.  He was admitted overnight for  evaluation.  His enzymes were negative. His TSH was normal.  His INR  remained therapeutic at 2.0.  He was initially placed on Cardizem and  quickly converted back to normal sinus rhythm. At this point, he will be  discharged to home with a less than 24-hour hospitalization on his admission  medications which include the following.   DISCHARGE MEDICATIONS:  1. Coumadin.  2. Tenormin 25 mg a day.  3. Altace 10 mg a day.  4. Lipitor 10 mg q.h.s.  5. Flomax 0.4 mg a day.  6. Wellbutrin 150  mg a day.  7. Prevacid 30 mg a day.   His EKG while in normal sinus rhythm revealed a rate of 76 and was  essentially normal.   DISPOSITION:  The patient will be discharged to home in stable condition.   ACTIVITY:  As per instructions by Dr. Lequita Halt.   WOUND CARE:  As per instructions by Dr. Lequita Halt.   DIET:  The patient  is to remain on a low fat diet.   FOLLOW UP:  Dr. Blossom Hoops office will call for a follow-up appointment.    Guy Franco, P.A. [facility]                      Olga Millers, M.D. [facility]   LB/MEDQ  D:  04/06/2002  T:  04/06/2002  Job:  [id number]

## 2010-06-26 NOTE — Discharge Summary (Signed)
NAME:  ROI, JAFARI NO.:  [id number]   MEDICAL RECORD NO.:  [id number]                   PATIENT TYPE:  INP   LOCATION:  6522                                 FACILITY:  [facility]   PHYSICIAN:  Ollen Gross, M.D.                 DATE OF BIRTH:  October 18, 1929   DATE OF ADMISSION:  04/02/2002  DATE OF DISCHARGE:  04/05/2002                                 DISCHARGE SUMMARY   ADMITTING DIAGNOSES:  1. Left knee osteoarthritis.  2. History of myocardial infarction 1994.  3. Hiatal hernia.  4. Benign prostatic hypertrophy.  5. History of postoperative anemia.  6. Status post right total knee replacement arthroplasty in 1998.   DISCHARGE DIAGNOSES:  1. Osteoarthritis left knee status post left total knee replacement     arthroplasty.  2. Mild postoperative blood loss anemia, did not require transfusion.  3. Mild postoperative hyponatremia, improved.  4. History of myocardial infarction 1994.  5. Hiatal hernia.  6. Benign prostatic hypertrophy.  7. Status post right total knee replacement arthroplasty in 1998.   PROCEDURE:  The patient was taken to the OR on 04/02/2002 underwent a left  total knee arthroplasty.  Surgeon Dr. Despina Hick.  Assistant Avel Peace, PAC.  Spinal anesthesia.  Minimal blood loss.  Hemovac drain x1.  Tourniquet time  51 minutes at 300 mmHg.   CONSULTATIONS:  None.   BRIEF HISTORY:  The patient is a 77 year old male who is evaluated by Dr.  Despina Hick for ongoing left knee pain.  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  He has reached a  point where he would like to proceed with left knee replacement.  Risks and  benefits discussed.  The patient is admitted  to the hospital.   LABORATORY DATA:  CBC on admission: Hemoglobin of 14.8, hematocrit of 44.1,  white cell count 8.1, red cell count 4.94.  Postop H&H 11.5 and 34.1,  continued to drop down to 9.8 and 29.0, last noted H&H 9.2 and 27.0.  Differential on the admission CBC all within normal limits.  PT/PTT on  admission 12.6 and 21, respectively, with an INR of 0.9.  Serial prothrombin  times followed.  Last noted PT/INR 22.3 and 2.2.  Chem panel on admission  only minimally elevated BUN of 26, remaining chem panel all within normal  limits.  Serial BMET's were followed.  Sodium dropped from 138 down to 133  back up to 135, glucose increased from 96 to 135 back down to 106, calcium  dropped from 9.3 to 8.3.  Urinalysis on admission negative.  Blood group  type  A positive.   Chest x-ray dated 03/26/2002 no active cardiopulmonary disease.  I do not  see an EKG report on his chart.   HOSPITAL COURSE:  The patient admitted to [facility], taken to OR,  underwent above-stated procedure without complications.  The patient  tolerated the procedure well and was allowed to recovery room then to the  orthopedic floor for continued postop care, given 24 hours of post IV  antibiotics, placed on Coumadin for DVT prophylaxis.  PT and OT were  consulted to assist with gait training ambulation and ADL's.  The patient  progressed very well with physical therapy.  Hemovac drain placed during the  surgery was pulled on postop day #1.  Dressing change initiated on postop  day #2.  His incision was healing well.  He was weaned over to p.o. meds at  that time, the IV and PCA was discontinued and the Foley was out.  By postop  day #1 he was ambulating 70 feet and already ambulating 180 feet by postop  day #2.  The patient did extremely well with physical therapy and therefore  by day #3 he was ambulating greater than 200 feet, tolerating p.o. meds and  was discharged home.   DISCHARGE MEDICATIONS AND  PLAN:  1. The patient discharged home on 04/05/2002.  2. Discharge diagnoses: Please see above.  3. Discharge medications: Percocet for pain, Robaxin for spasm, Coumadin for     DVT prophylaxis.  4. Diet: Low sodium diet.  5. Activity: Weightbearing as tolerated.  Total knee protocol.  Home health     PT and home health nursing through [facility].  6. Followup: Two weeks from surgery.   DISPOSITION:  Home.   CONDITION ON DISCHARGE:  Greatly improved.     Alexzandrew L. Julien Girt, P.A.              Ollen Gross, M.D.    ALP/MEDQ  D:  04/23/2002  T:  04/23/2002  Job:  [id number]

## 2010-07-09 ENCOUNTER — Other Ambulatory Visit: Payer: Self-pay

## 2010-07-13 ENCOUNTER — Encounter: Payer: Medicare Other | Admitting: *Deleted

## 2010-07-13 ENCOUNTER — Encounter: Payer: Self-pay | Admitting: Cardiology

## 2010-07-16 ENCOUNTER — Other Ambulatory Visit: Payer: Self-pay | Admitting: Family Medicine

## 2010-07-20 ENCOUNTER — Ambulatory Visit (INDEPENDENT_AMBULATORY_CARE_PROVIDER_SITE_OTHER): Payer: Medicare Other | Admitting: *Deleted

## 2010-07-20 ENCOUNTER — Encounter: Payer: Medicare Other | Admitting: Family Medicine

## 2010-07-20 DIAGNOSIS — I4891 Unspecified atrial fibrillation: Secondary | ICD-10-CM

## 2010-07-20 DIAGNOSIS — Z7901 Long term (current) use of anticoagulants: Secondary | ICD-10-CM

## 2010-08-03 ENCOUNTER — Ambulatory Visit (INDEPENDENT_AMBULATORY_CARE_PROVIDER_SITE_OTHER): Payer: Medicare Other | Admitting: Cardiology

## 2010-08-03 ENCOUNTER — Encounter: Payer: Self-pay | Admitting: Cardiology

## 2010-08-03 DIAGNOSIS — I519 Heart disease, unspecified: Secondary | ICD-10-CM

## 2010-08-03 DIAGNOSIS — E785 Hyperlipidemia, unspecified: Secondary | ICD-10-CM

## 2010-08-03 DIAGNOSIS — I4891 Unspecified atrial fibrillation: Secondary | ICD-10-CM

## 2010-08-03 DIAGNOSIS — I2510 Atherosclerotic heart disease of native coronary artery without angina pectoris: Secondary | ICD-10-CM

## 2010-08-03 DIAGNOSIS — I10 Essential (primary) hypertension: Secondary | ICD-10-CM

## 2010-08-03 NOTE — Assessment & Plan Note (Signed)
Stable CAD.  He is very active with minimal symptoms.  Continue ASA 81, beta blocker, atorvastatin.  Will get echo to assess LV systolic function.

## 2010-08-03 NOTE — Assessment & Plan Note (Signed)
BP is high today but has been normal at other times when checked recently. I will have him track his BP at home for the next 2 wks, and we will call to see what it has been running.

## 2010-08-03 NOTE — Patient Instructions (Signed)
Your physician recommends that you schedule a follow-up appointment in: 6 MONTHS WITH DR [facility]  Your physician has recommended you make the following change in your medication: STOP DIGOXIN  Your physician has requested that you have an echocardiogram. Echocardiography is a painless test that uses sound waves to create images of your heart. It provides your doctor with information about the size and shape of your heart and how well your heart's chambers and valves are working. This procedure takes approximately one hour. There are no restrictions for this procedure. PT'S CONVENIENCE DX 427.31   CHECK B/P OFFICE WILL IN COUPLE OF WEEKS TO CHECK ON READINGS

## 2010-08-03 NOTE — Assessment & Plan Note (Signed)
He has been very symptomatic with past atrial fibrillation episodes but they have been short-lived.  No atrial fibrillation symptoms since fall 2010.   - Continue coumadin.  He has maintained a stable INR and has done well on coumadin for years. Therefore, I do not think that it would be that useful to switch him to rivaroxaban or dabigatran.   - Continue atenolol at current dose.  - Discontinue digoxin.  He rarely has atrial fibrillation, no event x 1.5 years.

## 2010-08-03 NOTE — Progress Notes (Signed)
PCP: Dr. 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  He has been seen by Dr. Juanda Chance in the past and is seen by me for the first time today.  He had an RV infarct with chronic total occlusion of a small nondominant RCA in the 1980s.  He had a drug-eluting stent to the CFX in 11/10.  Last episode of symptomatic atrial fibrillation was in fall 2010.  He is very symptomatic with atrial fibrillation episodes but they never last longer than about 6 hours.  He has been on coumadin long-term and has his INR well-controlled.  He has had a chronic pattern of mild shortness of breath and mild left shoulder pain when walking up a hill (x 20 years).  There has been no change in this symptom recently.  He walks on a treadmill for 30 minutes daily with no exertional dyspnea or chest pain.  He can walk up 2 flights of steps without a problem.  He golfs 3-4 times a week.  BP is high today but has been normal at other recent checks.   ECG: Unusual P wave axis (suspect ectopic atrial focus) with rate 62, T wave inversions in III and AVF.   Labs (6/11): LDL 63, HDL 35, K 5.5, creatinine 1.1  PMH: 1. CAD: RV infarct in the 1980s.  Chronic total occlusion of a small nondominant RCA.  11/10 PCI to CFX with Xience DES.  EF 60% on LV-gram at that time.  2.  Paroxysmal atrial fibrillation: Last symptomatic episode in fall 2010.  3.  Hyperlipidemia 4. GERD 5. HTN 6. Asthma  SH: Nonsmoker.  Retired Marine scientist, lives in [place].  2 daughters.  Originally from [place].   FH: Brother with CABG.   ROS: All systems reviewed and negative except as per HPI.   Current Outpatient Prescriptions  Medication Sig Dispense Refill  . aspirin (ASPIR-81) 81 MG EC tablet Take 81 mg by mouth daily.        Marland Kitchen atenolol (TENORMIN) 50 MG tablet TAKE 1 TABLET BY MOUTH ONCE DAILY  90 tablet  0  . atorvastatin (LIPITOR) 10 MG tablet Take 20 mg by  mouth daily.       . beclomethasone (QVAR) 40 MCG/ACT inhaler Inhale 2 puffs into the lungs 2 (two) times daily.  1 Inhaler  12  . buPROPion (WELLBUTRIN SR) 150 MG 12 hr tablet TAKE 1 TABLET BY MOUTH ONCE DAILY  90 tablet  0  . Cholecalciferol (VITAMIN D3) 400 UNITS CAPS Take by mouth daily.        . digoxin (LANOXIN) 0.125 MG tablet TAKE 1 TABLET BY MOUTH EVERY MORNING  90 tablet  0  . fluticasone (FLOVENT DISKUS) 50 MCG/BLIST diskus inhaler Inhale 1 puff into the lungs as needed.        . Multiple Vitamins-Minerals (MULTIVITAL) tablet Take 1 tablet by mouth daily.        . NON FORMULARY Pt on study drug       . Omega-3 Fatty Acids (FISH OIL) 1000 MG CAPS Take by mouth daily.        Marland Kitchen omeprazole (PRILOSEC) 20 MG capsule Take 20 mg by mouth daily.        . Tamsulosin HCl (FLOMAX) 0.4 MG CAPS Take by mouth daily.        Marland Kitchen warfarin (COUMADIN) 5 MG tablet TAKE ONE TABLET BY MOUTH ONCE A DAY AS DIRECTED  90  tablet  0  . nitroGLYCERIN (NITROSTAT) 0.4 MG SL tablet Place 0.4 mg under the tongue as directed.        Marland Kitchen DISCONTD: Fluticasone-Salmeterol (ADVAIR DISKUS) 100-50 MCG/DOSE AEPB Inhale 1 puff into the lungs daily.          BP 152/81  Pulse 62  Ht 5\' 11"  (1.803 m)  Wt 167 lb (75.751 kg)  BMI 23.29 kg/m2 General: NAD Neck: No JVD, no thyromegaly or thyroid nodule.  Lungs: Clear to auscultation bilaterally with normal respiratory effort. CV: Nondisplaced PMI.  Heart regular S1/S2, soft S4, no murmur.  No peripheral edema.  No carotid bruit.  Normal pedal pulses.  Abdomen: Soft, nontender, no hepatosplenomegaly, no distention.  Neurologic: Alert and oriented x 3.  Psych: Normal affect. Extremities: No clubbing or cyanosis.

## 2010-08-03 NOTE — Assessment & Plan Note (Signed)
Needs lipids/LFTs.  Goal LDL < 70.  He is also on the REVEAL study drug.

## 2010-08-04 ENCOUNTER — Telehealth: Payer: Self-pay | Admitting: *Deleted

## 2010-08-04 DIAGNOSIS — E785 Hyperlipidemia, unspecified: Secondary | ICD-10-CM

## 2010-08-04 NOTE — Telephone Encounter (Signed)
LMTCB

## 2010-08-04 NOTE — Telephone Encounter (Signed)
    Dr. Sherrie Mustache needs lipids/LFTs done in the next couple of weeks.

## 2010-08-04 NOTE — Telephone Encounter (Signed)
I talked with Dr Sherrie Mustache. He is scheduled for fasting lab  tomorrow with Dr Tawanna Cooler. I will order a lipid /liver profile.

## 2010-08-05 ENCOUNTER — Ambulatory Visit (INDEPENDENT_AMBULATORY_CARE_PROVIDER_SITE_OTHER): Payer: Medicare Other | Admitting: Family Medicine

## 2010-08-05 ENCOUNTER — Encounter: Payer: Self-pay | Admitting: Family Medicine

## 2010-08-05 DIAGNOSIS — E785 Hyperlipidemia, unspecified: Secondary | ICD-10-CM

## 2010-08-05 DIAGNOSIS — I4891 Unspecified atrial fibrillation: Secondary | ICD-10-CM

## 2010-08-05 DIAGNOSIS — F419 Anxiety disorder, unspecified: Secondary | ICD-10-CM

## 2010-08-05 DIAGNOSIS — K219 Gastro-esophageal reflux disease without esophagitis: Secondary | ICD-10-CM

## 2010-08-05 DIAGNOSIS — I10 Essential (primary) hypertension: Secondary | ICD-10-CM

## 2010-08-05 DIAGNOSIS — J45909 Unspecified asthma, uncomplicated: Secondary | ICD-10-CM

## 2010-08-05 DIAGNOSIS — F411 Generalized anxiety disorder: Secondary | ICD-10-CM

## 2010-08-05 DIAGNOSIS — I2510 Atherosclerotic heart disease of native coronary artery without angina pectoris: Secondary | ICD-10-CM

## 2010-08-05 DIAGNOSIS — N401 Enlarged prostate with lower urinary tract symptoms: Secondary | ICD-10-CM

## 2010-08-05 LAB — POCT URINALYSIS DIPSTICK
Bilirubin, UA: NEGATIVE
Ketones, UA: NEGATIVE
Leukocytes, UA: NEGATIVE
Protein, UA: NEGATIVE
Spec Grav, UA: 1.015

## 2010-08-05 LAB — HEPATIC FUNCTION PANEL
AST: 19 U/L (ref 0–37)
Albumin: 4.1 g/dL (ref 3.5–5.2)
Total Protein: 6.6 g/dL (ref 6.0–8.3)

## 2010-08-05 LAB — CBC WITH DIFFERENTIAL/PLATELET
Basophils Absolute: 0 10*3/uL (ref 0.0–0.1)
Basophils Relative: 0.3 % (ref 0.0–3.0)
Eosinophils Absolute: 0.4 10*3/uL (ref 0.0–0.7)
HCT: 42.3 % (ref 39.0–52.0)
Hemoglobin: 14.3 g/dL (ref 13.0–17.0)
Lymphs Abs: 1.2 10*3/uL (ref 0.7–4.0)
MCHC: 33.7 g/dL (ref 30.0–36.0)
Neutro Abs: 5.4 10*3/uL (ref 1.4–7.7)
RDW: 13.6 % (ref 11.5–14.6)

## 2010-08-05 LAB — BASIC METABOLIC PANEL
CO2: 30 mEq/L (ref 19–32)
Chloride: 105 mEq/L (ref 96–112)
Glucose, Bld: 97 mg/dL (ref 70–99)
Potassium: 4.7 mEq/L (ref 3.5–5.1)
Sodium: 141 mEq/L (ref 135–145)

## 2010-08-05 LAB — LIPID PANEL
Cholesterol: 108 mg/dL (ref 0–200)
HDL: 33.2 mg/dL — ABNORMAL LOW (ref >39.00)
VLDL: 17.2 mg/dL (ref 0.0–40.0)

## 2010-08-05 LAB — TSH: TSH: 3.31 u[IU]/mL (ref 0.35–5.50)

## 2010-08-05 MED ORDER — BUPROPION HCL ER (SR) 150 MG PO TB12: 150.0000 mg | ORAL_TABLET | ORAL | Status: DC

## 2010-08-05 MED ORDER — OMEPRAZOLE 20 MG PO CPDR
20.0000 mg | DELAYED_RELEASE_CAPSULE | Freq: Every day | ORAL | Status: DC
Start: 2010-08-05 — End: 2011-08-16

## 2010-08-05 MED ORDER — ATENOLOL 50 MG PO TABS: 50.0000 mg | ORAL_TABLET | Freq: Every day | ORAL | Status: DC

## 2010-08-05 MED ORDER — ATORVASTATIN CALCIUM 10 MG PO TABS: 20.0000 mg | ORAL_TABLET | Freq: Every day | ORAL | Status: DC

## 2010-08-05 MED ORDER — WARFARIN SODIUM 5 MG PO TABS: 5.0000 mg | ORAL_TABLET | Freq: Every day | ORAL | Status: DC

## 2010-08-05 MED ORDER — TAMSULOSIN HCL 0.4 MG PO CAPS: 0.4000 mg | ORAL_CAPSULE | Freq: Every day | ORAL | Status: DC

## 2010-08-05 NOTE — Progress Notes (Signed)
Subjective:    Patient ID: [name] [name], male    DOB: 1929/04/23, 77 y.o.   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  He has a history of underlying hypertension, for which he takes Tenormin, 50 mg daily BP at home 130/80.  He takes Lipitor at 20 mg nightly for hyperlipidemia.  Will check lipid panel.  He uses an inhaled steroid, two puffs b.i.d. He takes for a [place] 150 mg daily.  He takes Prilosec 20 mg daily for reflux asymptomatic on medication.  He takes Flomax .4 daily for BPH and outlet obstruction.  Symptoms, unchanged.  We discussed the pluses and minuses of PSA screening.  He also takes Coumadin because of a history of AF currently asymptomatic.  No AF for over two years.  Dr. Lucien Mons his new cardiologist stopped his Lanoxin.  He gets routine eye care.  Bilateral cataracts, but seeing well.  Dr. Dagoberto Ligas his his ophthalmologist.  Tetanus 2009 Pneumovax complete shingles 2009.  He plays golf 3 to 4 days per week, and other days.  He walks [place] functions independently.  Able to do all his own financial work.  He does have a healthcare power of attorney and living will.  No guns in the house, home safety reviewed.  No issues identified   Review of Systems  Constitutional: Negative.   HENT: Negative.   Eyes: Negative.   Respiratory: Negative.   Cardiovascular: Negative.   Gastrointestinal: Negative.   Genitourinary: Negative.   Musculoskeletal: Negative.   Skin: Negative.   Neurological: Negative.   Hematological: Negative.   Psychiatric/Behavioral: Negative.        Objective:   Physical Exam  Constitutional: He is oriented to person, place, and time. He appears well-developed and well-nourished.  HENT:  Head: Normocephalic and atraumatic.  Right Ear: External ear normal.  Left Ear: External ear normal.  Nose: Nose normal.    Mouth/Throat: Oropharynx is clear and moist.       Bilateral cataracts  Eyes: Conjunctivae and EOM are normal. Pupils are equal, round, and reactive to light.  Neck: Normal range of motion. Neck supple. No JVD present. No tracheal deviation present. No thyromegaly present.  Cardiovascular: Normal rate, regular rhythm, normal heart sounds and intact distal pulses.  Exam reveals no gallop and no friction rub.   No murmur heard. Pulmonary/Chest: Effort normal and breath sounds normal. No stridor. No respiratory distress. He has no wheezes. He has no rales. He exhibits no tenderness.  Abdominal: Soft. Bowel sounds are normal. He exhibits no distension and no mass. There is no tenderness. There is no rebound and no guarding.  Genitourinary: Rectum normal and penis normal. Guaiac negative stool. No penile tenderness.       1+ symmetrical.  BPH.  No nodules  Musculoskeletal: Normal range of motion. He exhibits no edema and no tenderness.  Lymphadenopathy:    He has no cervical adenopathy.  Neurological: He is alert and oriented to person, place, and time. He has normal reflexes. No cranial nerve deficit. He exhibits normal muscle tone.  Skin: Skin is warm and dry. No rash noted. No erythema. No pallor.  Psychiatric: He has a normal mood and affect. His behavior is normal. Judgment and thought content normal.          Assessment & Plan:  Healthy male.  History of atrial fibrillation, currently asymptomatic on atenolol, 50 mg daily, and  Coumadin prophylactically.  History of hyperlipidemia.  Continue Lipitor.  History of asthma continued inhaled steroid, two puffs b.i.d.  History of mild depression.  Continue Wellbutrin 150 mg daily.  Coronary disease, asymptomatic.  History reflux esophagitis.  Continue Prilosec 20 daily.  History of BPH with outlet obstruction.  Continue Flomax .4 daily.

## 2010-08-05 NOTE — Patient Instructions (Signed)
Continue your current medications return in one year, sooner if any problems

## 2010-08-07 ENCOUNTER — Ambulatory Visit (HOSPITAL_COMMUNITY): Payer: Medicare Other | Attending: Family Medicine | Admitting: Radiology

## 2010-08-07 DIAGNOSIS — I4891 Unspecified atrial fibrillation: Secondary | ICD-10-CM

## 2010-08-07 DIAGNOSIS — I079 Rheumatic tricuspid valve disease, unspecified: Secondary | ICD-10-CM | POA: Insufficient documentation

## 2010-08-07 DIAGNOSIS — I10 Essential (primary) hypertension: Secondary | ICD-10-CM | POA: Insufficient documentation

## 2010-08-07 DIAGNOSIS — I080 Rheumatic disorders of both mitral and aortic valves: Secondary | ICD-10-CM | POA: Insufficient documentation

## 2010-08-07 DIAGNOSIS — I2510 Atherosclerotic heart disease of native coronary artery without angina pectoris: Secondary | ICD-10-CM

## 2010-08-07 DIAGNOSIS — I519 Heart disease, unspecified: Secondary | ICD-10-CM | POA: Insufficient documentation

## 2010-08-07 DIAGNOSIS — I379 Nonrheumatic pulmonary valve disorder, unspecified: Secondary | ICD-10-CM | POA: Insufficient documentation

## 2010-08-07 DIAGNOSIS — E785 Hyperlipidemia, unspecified: Secondary | ICD-10-CM | POA: Insufficient documentation

## 2010-08-17 ENCOUNTER — Ambulatory Visit (INDEPENDENT_AMBULATORY_CARE_PROVIDER_SITE_OTHER): Payer: Medicare Other | Admitting: *Deleted

## 2010-08-17 DIAGNOSIS — Z7901 Long term (current) use of anticoagulants: Secondary | ICD-10-CM

## 2010-08-17 DIAGNOSIS — I4891 Unspecified atrial fibrillation: Secondary | ICD-10-CM

## 2010-09-14 ENCOUNTER — Ambulatory Visit (INDEPENDENT_AMBULATORY_CARE_PROVIDER_SITE_OTHER): Payer: Medicare Other | Admitting: *Deleted

## 2010-09-14 DIAGNOSIS — Z7901 Long term (current) use of anticoagulants: Secondary | ICD-10-CM

## 2010-09-14 DIAGNOSIS — I4891 Unspecified atrial fibrillation: Secondary | ICD-10-CM

## 2010-10-19 ENCOUNTER — Ambulatory Visit (INDEPENDENT_AMBULATORY_CARE_PROVIDER_SITE_OTHER): Payer: Medicare Other | Admitting: *Deleted

## 2010-10-19 DIAGNOSIS — Z7901 Long term (current) use of anticoagulants: Secondary | ICD-10-CM

## 2010-10-19 DIAGNOSIS — I4891 Unspecified atrial fibrillation: Secondary | ICD-10-CM

## 2010-10-19 LAB — POCT INR: INR: 3.2

## 2010-11-16 ENCOUNTER — Encounter: Payer: Medicare Other | Admitting: *Deleted

## 2010-11-18 ENCOUNTER — Other Ambulatory Visit: Payer: Self-pay | Admitting: Family Medicine

## 2010-11-25 ENCOUNTER — Ambulatory Visit (INDEPENDENT_AMBULATORY_CARE_PROVIDER_SITE_OTHER): Payer: Medicare Other | Admitting: *Deleted

## 2010-11-25 DIAGNOSIS — I4891 Unspecified atrial fibrillation: Secondary | ICD-10-CM

## 2010-11-25 DIAGNOSIS — Z7901 Long term (current) use of anticoagulants: Secondary | ICD-10-CM

## 2010-11-30 ENCOUNTER — Other Ambulatory Visit: Payer: Self-pay | Admitting: Family Medicine

## 2010-12-16 ENCOUNTER — Ambulatory Visit (INDEPENDENT_AMBULATORY_CARE_PROVIDER_SITE_OTHER): Payer: Medicare Other | Admitting: *Deleted

## 2010-12-16 DIAGNOSIS — I4891 Unspecified atrial fibrillation: Secondary | ICD-10-CM

## 2010-12-16 DIAGNOSIS — Z7901 Long term (current) use of anticoagulants: Secondary | ICD-10-CM

## 2011-01-11 ENCOUNTER — Ambulatory Visit (INDEPENDENT_AMBULATORY_CARE_PROVIDER_SITE_OTHER): Payer: Medicare Other | Admitting: *Deleted

## 2011-01-11 DIAGNOSIS — Z7901 Long term (current) use of anticoagulants: Secondary | ICD-10-CM

## 2011-01-11 DIAGNOSIS — I4891 Unspecified atrial fibrillation: Secondary | ICD-10-CM

## 2011-01-19 ENCOUNTER — Ambulatory Visit (INDEPENDENT_AMBULATORY_CARE_PROVIDER_SITE_OTHER): Payer: Medicare Other | Admitting: Cardiology

## 2011-01-19 ENCOUNTER — Encounter: Payer: Self-pay | Admitting: Cardiology

## 2011-01-19 DIAGNOSIS — E785 Hyperlipidemia, unspecified: Secondary | ICD-10-CM

## 2011-01-19 DIAGNOSIS — I10 Essential (primary) hypertension: Secondary | ICD-10-CM

## 2011-01-19 DIAGNOSIS — I5189 Other ill-defined heart diseases: Secondary | ICD-10-CM

## 2011-01-19 DIAGNOSIS — R0602 Shortness of breath: Secondary | ICD-10-CM

## 2011-01-19 DIAGNOSIS — I2510 Atherosclerotic heart disease of native coronary artery without angina pectoris: Secondary | ICD-10-CM

## 2011-01-19 DIAGNOSIS — J45901 Unspecified asthma with (acute) exacerbation: Secondary | ICD-10-CM

## 2011-01-19 DIAGNOSIS — I4891 Unspecified atrial fibrillation: Secondary | ICD-10-CM

## 2011-01-19 LAB — BRAIN NATRIURETIC PEPTIDE: Pro B Natriuretic peptide (BNP): 79 pg/mL (ref 0.0–100.0)

## 2011-01-19 NOTE — Patient Instructions (Addendum)
Your physician has requested that you have an echocardiogram in 6 months. Echocardiography is a painless test that uses sound waves to create images of your heart. It provides your doctor with information about the size and shape of your heart and how well your heart's chambers and valves are working. This procedure takes approximately one hour. There are no restrictions for this procedure.   Your physician wants you to follow-up in: 6 months You will receive a reminder letter in the mail two months in advance. If you don't receive a letter, please call our office to schedule the follow-up appointment.

## 2011-01-20 DIAGNOSIS — I5189 Other ill-defined heart diseases: Secondary | ICD-10-CM | POA: Insufficient documentation

## 2011-01-20 NOTE — Progress Notes (Signed)
PCP: Dr. 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  He had an RV infarct with chronic total occlusion of a small nondominant RCA in the 1980s.  He had a drug-eluting stent to the CFX in 11/10.  Last episode of symptomatic atrial fibrillation was in fall 2010.  He is very symptomatic with atrial fibrillation episodes but they never last longer than about 6 hours.  He thinks he had about a 2 hour episode in 6/12.  He is in NSR today.  He has been on coumadin long-term and has his INR well-controlled.  He has had a chronic pattern of mild shortness of breath and mild left shoulder pain when walking up a hill (x 20 years).  There has been no change in this symptom.  He walks on a treadmill for 30 minutes daily with no exertional dyspnea or chest pain.  He can walk up 3 flights of steps without a problem.  He golfs 3-4 times a week.  BP is well-controlled today.  After last appointment, I had him get an echocardiogram.  EF was preserved at 55-60% but there was an elevation in PA systolic pressure suggestive of diastolic dysfunction.   ECG: NSR, normal  Labs (6/11): LDL 63, HDL 35, K 5.5, creatinine 1.1 Labs (6/12): LDL 58, HDL 53, TSH normal, K 4.7, creatinine 1  PMH: 1. CAD: RV infarct in the 1980s.  Chronic total occlusion of a small nondominant RCA.  11/10 PCI to CFX with Xience DES.  EF 60% on LV-gram at that time.  2.  Paroxysmal atrial fibrillation: Last symptomatic episode in fall 2010.  3.  Hyperlipidemia 4. GERD 5. HTN 6. Asthma 7. Suspected diastolic LV dysfunction: Echo (6/12): EF 55-60%, mild LV hypertrophy, no regional WMAs, mild AI, mild MR, mild-moderate biatrial enlargement, PA systolic pressure 51 mmHg.   SH: Nonsmoker.  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  Originally from [place].   FH: Brother with CABG.   ROS: All systems reviewed and negative except as per HPI.    Current Outpatient Prescriptions  Medication Sig Dispense Refill  . aspirin (ASPIR-81) 81 MG EC tablet Take 81 mg by mouth daily.        Marland Kitchen atenolol (TENORMIN) 50 MG tablet Take 1 tablet (50 mg total) by mouth daily.  100 tablet  3  . atenolol (TENORMIN) 50 MG tablet TAKE 1 TABLET BY MOUTH ONCE DAILY  90 tablet  0  . atorvastatin (LIPITOR) 10 MG tablet Take 2 tablets (20 mg total) by mouth daily.  100 tablet  3  . beclomethasone (QVAR) 40 MCG/ACT inhaler Inhale 2 puffs into the lungs 2 (two) times daily.  1 Inhaler  12  . buPROPion (WELLBUTRIN SR) 150 MG 12 hr tablet Take 1 tablet (150 mg total) by mouth 1 dose over 24 hours.  100 tablet  3  . Cholecalciferol (VITAMIN D3) 400 UNITS CAPS Take by mouth daily.        . Multiple Vitamins-Minerals (MULTIVITAL) tablet Take 1 tablet by mouth daily.        . NON FORMULARY Pt on study drug       . Omega-3 Fatty Acids (FISH OIL) 1000 MG CAPS Take by mouth daily.        Marland Kitchen omeprazole (PRILOSEC) 20 MG capsule Take 1 capsule (20 mg total) by mouth daily.  100 capsule  3  . Tamsulosin HCl (FLOMAX) 0.4 MG  CAPS Take 1 capsule (0.4 mg total) by mouth daily.  100 capsule  3  . warfarin (COUMADIN) 5 MG tablet Take 1 tablet (5 mg total) by mouth daily.  100 tablet  3    BP 116/71  Pulse 70  Ht 5' 10.5" (1.791 m)  Wt 76.204 kg (168 lb)  BMI 23.76 kg/m2 General: NAD Neck: No JVD, no thyromegaly or thyroid nodule.  Lungs: Clear to auscultation bilaterally with normal respiratory effort. CV: Nondisplaced PMI.  Heart regular S1/S2, soft S4, no murmur.  No peripheral edema.  No carotid bruit.  Normal pedal pulses.  Abdomen: Soft, nontender, no hepatosplenomegaly, no distention.  Neurologic: Alert and oriented x 3.  Psych: Normal affect. Extremities: No clubbing or cyanosis.

## 2011-01-20 NOTE — Assessment & Plan Note (Signed)
Lipids at goal when last checked (LDL < 70).

## 2011-01-20 NOTE — Assessment & Plan Note (Signed)
He has been very symptomatic with past atrial fibrillation episodes but they have been short-lived. He is in NSR.  - Continue coumadin.  He has maintained a stable INR and has done well on coumadin for years. Therefore, I do not think that it would be that useful to switch him to rivaroxaban or dabigatran.   - Continue atenolol at current dose.

## 2011-01-20 NOTE — Assessment & Plan Note (Signed)
BP seems to be under good control.  Continue atenolol.

## 2011-01-20 NOTE — Assessment & Plan Note (Signed)
Stable with no ischemic symptoms.  Continue ASA 81, statin, and atenolol.

## 2011-01-20 NOTE — Assessment & Plan Note (Addendum)
By echo, Dr. Sherrie Mustache has evidence for diastolic dysfunction (elevated PA pressure -- diastolic dysfunction is most likely cause for this, no symptoms consistent with OSA).  He is asymptomatic with no CHF symptoms.  Continue to monitor, will repeat echo to assess PA pressure in 6/13.  I will check a BNP today.

## 2011-01-26 ENCOUNTER — Other Ambulatory Visit (HOSPITAL_COMMUNITY): Payer: Medicare Other | Admitting: Radiology

## 2011-02-19 ENCOUNTER — Ambulatory Visit (INDEPENDENT_AMBULATORY_CARE_PROVIDER_SITE_OTHER): Payer: Medicare Other | Admitting: *Deleted

## 2011-02-19 DIAGNOSIS — Z7901 Long term (current) use of anticoagulants: Secondary | ICD-10-CM

## 2011-02-19 DIAGNOSIS — I4891 Unspecified atrial fibrillation: Secondary | ICD-10-CM

## 2011-02-19 LAB — POCT INR: INR: 2.3

## 2011-02-22 ENCOUNTER — Telehealth: Payer: Self-pay | Admitting: Family Medicine

## 2011-02-22 DIAGNOSIS — R140 Abdominal distension (gaseous): Secondary | ICD-10-CM

## 2011-02-22 MED ORDER — RIFAXIMIN 200 MG PO TABS: 200.0000 mg | ORAL_TABLET | Freq: Three times a day (TID) | ORAL | Status: AC

## 2011-02-22 MED ORDER — RIFAXIMIN 200 MG PO TABS: 200.0000 mg | ORAL_TABLET | Freq: Three times a day (TID) | ORAL | Status: DC

## 2011-04-02 ENCOUNTER — Ambulatory Visit (INDEPENDENT_AMBULATORY_CARE_PROVIDER_SITE_OTHER): Payer: Medicare Other

## 2011-04-02 DIAGNOSIS — Z7901 Long term (current) use of anticoagulants: Secondary | ICD-10-CM

## 2011-04-02 DIAGNOSIS — I4891 Unspecified atrial fibrillation: Secondary | ICD-10-CM

## 2011-04-16 NOTE — Telephone Encounter (Signed)
Opened in error

## 2011-04-30 ENCOUNTER — Ambulatory Visit (INDEPENDENT_AMBULATORY_CARE_PROVIDER_SITE_OTHER): Payer: Medicare Other | Admitting: Pharmacist

## 2011-04-30 DIAGNOSIS — Z7901 Long term (current) use of anticoagulants: Secondary | ICD-10-CM

## 2011-04-30 DIAGNOSIS — I4891 Unspecified atrial fibrillation: Secondary | ICD-10-CM

## 2011-04-30 LAB — POCT INR: INR: 1.8

## 2011-05-21 ENCOUNTER — Ambulatory Visit (INDEPENDENT_AMBULATORY_CARE_PROVIDER_SITE_OTHER): Payer: Medicare Other

## 2011-05-21 DIAGNOSIS — I4891 Unspecified atrial fibrillation: Secondary | ICD-10-CM

## 2011-05-21 DIAGNOSIS — Z7901 Long term (current) use of anticoagulants: Secondary | ICD-10-CM

## 2011-05-21 LAB — POCT INR: INR: 1.9

## 2011-06-23 ENCOUNTER — Ambulatory Visit (INDEPENDENT_AMBULATORY_CARE_PROVIDER_SITE_OTHER): Payer: Medicare Other | Admitting: Pharmacist

## 2011-06-23 DIAGNOSIS — Z7901 Long term (current) use of anticoagulants: Secondary | ICD-10-CM

## 2011-06-23 DIAGNOSIS — I4891 Unspecified atrial fibrillation: Secondary | ICD-10-CM

## 2011-06-23 LAB — POCT INR: INR: 1.8

## 2011-07-12 ENCOUNTER — Other Ambulatory Visit: Payer: Medicare Other | Admitting: *Deleted

## 2011-07-15 ENCOUNTER — Ambulatory Visit (INDEPENDENT_AMBULATORY_CARE_PROVIDER_SITE_OTHER): Payer: Medicare Other | Admitting: *Deleted

## 2011-07-15 DIAGNOSIS — I4891 Unspecified atrial fibrillation: Secondary | ICD-10-CM

## 2011-07-15 DIAGNOSIS — Z7901 Long term (current) use of anticoagulants: Secondary | ICD-10-CM

## 2011-08-09 ENCOUNTER — Encounter: Payer: Self-pay | Admitting: Cardiology

## 2011-08-09 ENCOUNTER — Ambulatory Visit (INDEPENDENT_AMBULATORY_CARE_PROVIDER_SITE_OTHER): Payer: Medicare Other | Admitting: Cardiology

## 2011-08-09 ENCOUNTER — Ambulatory Visit: Payer: Medicare Other | Admitting: Family Medicine

## 2011-08-09 VITALS — BP 120/78 | HR 60 | Ht 71.0 in | Wt 166.0 lb

## 2011-08-09 DIAGNOSIS — I519 Heart disease, unspecified: Secondary | ICD-10-CM

## 2011-08-09 DIAGNOSIS — I5189 Other ill-defined heart diseases: Secondary | ICD-10-CM

## 2011-08-09 DIAGNOSIS — I2510 Atherosclerotic heart disease of native coronary artery without angina pectoris: Secondary | ICD-10-CM

## 2011-08-09 DIAGNOSIS — E785 Hyperlipidemia, unspecified: Secondary | ICD-10-CM

## 2011-08-09 DIAGNOSIS — I4891 Unspecified atrial fibrillation: Secondary | ICD-10-CM

## 2011-08-09 NOTE — Patient Instructions (Addendum)
Stop aspirin.  Your physician wants you to follow-up in: 1 year with Dr Shirlee Latch. (July 2014). You will receive a reminder letter in the mail two months in advance. If you don't receive a letter, please call our office to schedule the follow-up appointment.

## 2011-08-09 NOTE — Assessment & Plan Note (Addendum)
He has been very symptomatic with past atrial fibrillation episodes but they have been short-lived. He is in NSR.  - Continue coumadin.  He has maintained a stable INR and has done well on coumadin for years. Therefore, I do not think that it would be that useful to switch him to rivaroxaban or dabigatran.   - Continue atenolol at current dose.  - Try to avoid his known triggers: excessive caffeine and lack of sleep.  - If episodes become more frequent, could use an antiarrhythmic like dronedarone but do not think benefit would outweigh the risks given infrequent symptomatic episodes.

## 2011-08-09 NOTE — Progress Notes (Signed)
Patient ID: EFRAIM VANALLEN, male   DOB: 05-19-1929, 78 y.o.   MRN: [id number] PCP: Dr. 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  He had an RV infarct with chronic total occlusion of a small nondominant RCA in the 1980s.  He had a drug-eluting stent to the CFX in 11/10.    He is very symptomatic with atrial fibrillation episodes but they never last longer than a few hours hours.  He has had 2 episodes since I last saw him, both occurred when he had stayed up late, gotten little sleep, and drunk a lot of caffeine.  He is in NSR today.  He has been on coumadin long-term and has his INR well-controlled.  He has had a chronic pattern of mild shortness of breath and mild left shoulder pain when walking up a hill (x 20 years).  There has been no change in this symptom.  He walks on a treadmill for 30 minutes daily with no exertional dyspnea or chest pain.  He can walk up 3 flights of steps without a problem.  He golfs 3-4 times a week.  BP is well-controlled today.  I had him get an echocardiogram last year.  EF was preserved at 55-60% but there was an elevation in PA systolic pressure suggestive of diastolic dysfunction.   ECG: NSR, normal  Labs (6/11): LDL 63, HDL 35, K 5.5, creatinine 1.1 Labs (6/12): LDL 58, HDL 53, TSH normal, K 4.7, creatinine 1 Labs (12/12): BNP 79  PMH: 1. CAD: RV infarct in the 1980s.  Chronic total occlusion of a small nondominant RCA.  11/10 PCI to CFX with Xience DES.  EF 60% on LV-gram at that time.  2.  Paroxysmal atrial fibrillation: Last symptomatic episode in fall 2010.  3.  Hyperlipidemia 4. GERD 5. HTN 6. Asthma 7. Suspected diastolic LV dysfunction: Echo (6/12): EF 55-60%, mild LV hypertrophy, no regional WMAs, mild AI, mild MR, mild-moderate biatrial enlargement, PA systolic pressure 51 mmHg.   SH: Nonsmoker.  Retired Marine scientist, lives in [place].  2 daughters.  Originally  from [place].   FH: Brother with CABG.    Current Outpatient Prescriptions  Medication Sig Dispense Refill  . atenolol (TENORMIN) 50 MG tablet Take 1 tablet (50 mg total) by mouth daily.  100 tablet  3  . atorvastatin (LIPITOR) 10 MG tablet Take 2 tablets (20 mg total) by mouth daily.  100 tablet  3  . buPROPion (WELLBUTRIN SR) 150 MG 12 hr tablet Take 1 tablet (150 mg total) by mouth 1 dose over 24 hours.  100 tablet  3  . Cholecalciferol (VITAMIN D3) 400 UNITS CAPS Take by mouth daily.        . Multiple Vitamins-Minerals (MULTIVITAL) tablet Take 1 tablet by mouth daily.        . NON FORMULARY Pt on study drug       . Omega-3 Fatty Acids (FISH OIL) 1000 MG CAPS Take by mouth daily.        Marland Kitchen omeprazole (PRILOSEC) 20 MG capsule Take 1 capsule (20 mg total) by mouth daily.  100 capsule  3  . Tamsulosin HCl (FLOMAX) 0.4 MG CAPS Take 1 capsule (0.4 mg total) by mouth daily.  100 capsule  3  . warfarin (COUMADIN) 5 MG tablet Take 1 tablet (5 mg total) by mouth daily.  100 tablet  3  . XIFAXAN 200 MG tablet as needed.      Marland Kitchen  DISCONTD: atenolol (TENORMIN) 50 MG tablet TAKE 1 TABLET BY MOUTH ONCE DAILY  90 tablet  0    BP 120/78  Pulse 60  Ht 5\' 11"  (1.803 m)  Wt 75.297 kg (166 lb)  BMI 23.15 kg/m2 General: NAD Neck: No JVD, no thyromegaly or thyroid nodule.  Lungs: Clear to auscultation bilaterally with normal respiratory effort. CV: Nondisplaced PMI.  Heart regular S1/S2, soft S4, no murmur.  No peripheral edema.  No carotid bruit.  Normal pedal pulses.  Abdomen: Soft, nontender, no hepatosplenomegaly, no distention.  Neurologic: Alert and oriented x 3.  Psych: Normal affect. Extremities: No clubbing or cyanosis.

## 2011-08-09 NOTE — Assessment & Plan Note (Signed)
By echo, Dr. Sherrie [name] has evidence for diastolic dysfunction (elevated PA pressure -- diastolic dysfunction is most likely cause for this, no symptoms consistent with OSA).  He is asymptomatic with no CHF symptoms.  Continue to monitor, no further workup unless symptoms worsen.

## 2011-08-09 NOTE — Assessment & Plan Note (Signed)
Goal LDL < 70.  Lipids checked via research department.

## 2011-08-09 NOTE — Assessment & Plan Note (Signed)
Stable with no ischemic symptoms. Continue statin and atenolol.  With stable CAD, he can stop ASA as he is also on warfarin.

## 2011-08-11 ENCOUNTER — Ambulatory Visit (INDEPENDENT_AMBULATORY_CARE_PROVIDER_SITE_OTHER): Payer: Medicare Other | Admitting: *Deleted

## 2011-08-11 DIAGNOSIS — I4891 Unspecified atrial fibrillation: Secondary | ICD-10-CM

## 2011-08-11 DIAGNOSIS — Z7901 Long term (current) use of anticoagulants: Secondary | ICD-10-CM

## 2011-08-11 LAB — POCT INR: INR: 2.5

## 2011-08-16 ENCOUNTER — Encounter: Payer: Self-pay | Admitting: Family Medicine

## 2011-08-16 ENCOUNTER — Ambulatory Visit (INDEPENDENT_AMBULATORY_CARE_PROVIDER_SITE_OTHER): Payer: Medicare Other | Admitting: Family Medicine

## 2011-08-16 VITALS — BP 110/78 | Temp 97.6°F | Ht 70.75 in | Wt 167.0 lb

## 2011-08-16 DIAGNOSIS — N401 Enlarged prostate with lower urinary tract symptoms: Secondary | ICD-10-CM

## 2011-08-16 DIAGNOSIS — F419 Anxiety disorder, unspecified: Secondary | ICD-10-CM

## 2011-08-16 DIAGNOSIS — E785 Hyperlipidemia, unspecified: Secondary | ICD-10-CM

## 2011-08-16 DIAGNOSIS — F411 Generalized anxiety disorder: Secondary | ICD-10-CM

## 2011-08-16 DIAGNOSIS — I2510 Atherosclerotic heart disease of native coronary artery without angina pectoris: Secondary | ICD-10-CM

## 2011-08-16 DIAGNOSIS — I4891 Unspecified atrial fibrillation: Secondary | ICD-10-CM

## 2011-08-16 DIAGNOSIS — K219 Gastro-esophageal reflux disease without esophagitis: Secondary | ICD-10-CM

## 2011-08-16 DIAGNOSIS — I10 Essential (primary) hypertension: Secondary | ICD-10-CM

## 2011-08-16 LAB — LIPID PANEL
LDL Cholesterol: 59 mg/dL (ref 0–99)
Total CHOL/HDL Ratio: 3

## 2011-08-16 LAB — HEPATIC FUNCTION PANEL
AST: 18 U/L (ref 0–37)
Albumin: 3.9 g/dL (ref 3.5–5.2)
Alkaline Phosphatase: 56 U/L (ref 39–117)
Bilirubin, Direct: 0.1 mg/dL (ref 0.0–0.3)
Total Bilirubin: 0.7 mg/dL (ref 0.3–1.2)

## 2011-08-16 LAB — POCT URINALYSIS DIPSTICK
Bilirubin, UA: NEGATIVE
Glucose, UA: NEGATIVE
Nitrite, UA: NEGATIVE
Spec Grav, UA: 1.015
pH, UA: 5.5

## 2011-08-16 LAB — CBC WITH DIFFERENTIAL/PLATELET
Basophils Absolute: 0 10*3/uL (ref 0.0–0.1)
Basophils Relative: 0.4 % (ref 0.0–3.0)
Eosinophils Absolute: 0.4 10*3/uL (ref 0.0–0.7)
HCT: 41.5 % (ref 39.0–52.0)
Hemoglobin: 13.7 g/dL (ref 13.0–17.0)
Lymphocytes Relative: 15.5 % (ref 12.0–46.0)
Lymphs Abs: 1.2 10*3/uL (ref 0.7–4.0)
MCHC: 32.9 g/dL (ref 30.0–36.0)
Monocytes Relative: 9.2 % (ref 3.0–12.0)
Neutro Abs: 5.3 10*3/uL (ref 1.4–7.7)
RBC: 4.47 Mil/uL (ref 4.22–5.81)
RDW: 13.8 % (ref 11.5–14.6)

## 2011-08-16 LAB — BASIC METABOLIC PANEL
BUN: 24 mg/dL — ABNORMAL HIGH (ref 6–23)
CO2: 29 mEq/L (ref 19–32)
Glucose, Bld: 109 mg/dL — ABNORMAL HIGH (ref 70–99)
Potassium: 4.8 mEq/L (ref 3.5–5.1)
Sodium: 138 mEq/L (ref 135–145)

## 2011-08-16 MED ORDER — OMEPRAZOLE 20 MG PO CPDR: 20.0000 mg | DELAYED_RELEASE_CAPSULE | Freq: Every day | ORAL | Status: DC

## 2011-08-16 MED ORDER — ATENOLOL 50 MG PO TABS: 50.0000 mg | ORAL_TABLET | Freq: Every day | ORAL | Status: DC

## 2011-08-16 MED ORDER — WARFARIN SODIUM 5 MG PO TABS: 5.0000 mg | ORAL_TABLET | Freq: Every day | ORAL | Status: DC

## 2011-08-16 MED ORDER — BUPROPION HCL ER (SR) 150 MG PO TB12: 150.0000 mg | ORAL_TABLET | ORAL | Status: DC

## 2011-08-16 MED ORDER — ATORVASTATIN CALCIUM 10 MG PO TABS: 20.0000 mg | ORAL_TABLET | Freq: Every day | ORAL | Status: DC

## 2011-08-16 MED ORDER — TAMSULOSIN HCL 0.4 MG PO CAPS: 0.4000 mg | ORAL_CAPSULE | Freq: Every day | ORAL | Status: DC

## 2011-08-16 MED ORDER — RIFAXIMIN 200 MG PO TABS: 200.0000 mg | ORAL_TABLET | Freq: Every morning | ORAL | Status: DC

## 2011-08-16 NOTE — Progress Notes (Signed)
Subjective:    Patient ID: [name] [name], male    DOB: 07/04/1929, 78 y.o.   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 He continues to take Coumadin because of his history of AF. He states he has had a good year in no problems except for wart behind his left knee.  He continues to play golf 3-4 days per week  Recent eye exam shows some early cataracts otherwise no visual changes, hearing normal except for hearing aid left ear, regular dental care, GI followup by Dr. Jarold Motto he has an appointment this Friday. He has recurrent bacterial infections which result in indigestion gas etc. for which she takes a medication that sometimes does not help. He will discuss this with Dr. Jarold Motto  Cognitive function normal he plays golf on a 3 times a week basis. Home health safety reviewed no issues identified, no guns in the house, he does have a health care power of attorney and a living well  Pneumovax x2 tetanus 2009 shingles 2009.  He also takes Qvar 1 puff each bedtime for history of chronic asthma   Review of Systems  Constitutional: Negative.   HENT: Negative.   Eyes: Negative.   Respiratory: Negative.   Cardiovascular: Negative.   Gastrointestinal: Negative.   Genitourinary: Negative.   Musculoskeletal: Negative.   Skin: Negative.   Neurological: Negative.   Hematological: Negative.   Psychiatric/Behavioral: Negative.        Objective:   Physical Exam  Constitutional: He is oriented to person, place, and time. He appears well-developed and well-nourished.  HENT:  Head: Normocephalic and atraumatic.  Right Ear: External ear normal.  Left Ear: External ear normal.  Nose: Nose normal.  Mouth/Throat: Oropharynx is clear and moist.  Eyes: Conjunctivae and EOM are normal.  Pupils are equal, round, and reactive to light.  Neck: Normal range of motion. Neck supple. No JVD present. No tracheal deviation present. No thyromegaly present.  Cardiovascular: Normal rate, regular rhythm, normal heart sounds and intact distal pulses.  Exam reveals no gallop and no friction rub.   No murmur heard. Pulmonary/Chest: Effort normal and breath sounds normal. No stridor. No respiratory distress. He has no wheezes. He has no rales. He exhibits no tenderness.  Abdominal: Soft. Bowel sounds are normal. He exhibits no distension and no mass. There is no tenderness. There is no rebound and no guarding.  Genitourinary: Rectum normal and penis normal. Guaiac negative stool. No penile tenderness.       3+ BPH nonnodular guaiac negative small bulge left groin consistent with a asymptomatic small hernia  Musculoskeletal: Normal range of motion. He exhibits no edema and no tenderness.  Lymphadenopathy:    He has no cervical adenopathy.  Neurological: He is alert and oriented to person, place, and time. He has normal reflexes. No cranial nerve deficit. He exhibits normal muscle tone.  Skin: Skin is warm and dry. No rash noted. No erythema. No pallor.       Total body skin exam normal except for wart behind his left knee we'll treat with cryo-  Psychiatric: He has a normal mood and affect. His behavior is normal. Judgment and thought content normal.          Assessment & Plan:  Healthy male  Hypertension continue current medication  Hyperlipidemia continue current  medication  History of mild anxiety/depression continue Wellbutrin  BPH continue Flomax  History of AF continue Coumadin  History of asthma continue Qvar 1 puff each bedtime

## 2011-08-16 NOTE — Patient Instructions (Signed)
Continue your current good health habits and medications  Return in one year sooner if any problems

## 2011-08-17 ENCOUNTER — Encounter: Payer: Self-pay | Admitting: Family Medicine

## 2011-08-20 ENCOUNTER — Encounter: Payer: Self-pay | Admitting: Gastroenterology

## 2011-08-20 ENCOUNTER — Other Ambulatory Visit (INDEPENDENT_AMBULATORY_CARE_PROVIDER_SITE_OTHER): Payer: Medicare Other

## 2011-08-20 ENCOUNTER — Ambulatory Visit (INDEPENDENT_AMBULATORY_CARE_PROVIDER_SITE_OTHER): Payer: Medicare Other | Admitting: Gastroenterology

## 2011-08-20 VITALS — BP 150/80 | HR 60 | Ht 70.5 in | Wt 164.2 lb

## 2011-08-20 DIAGNOSIS — Z5181 Encounter for therapeutic drug level monitoring: Secondary | ICD-10-CM

## 2011-08-20 DIAGNOSIS — I4891 Unspecified atrial fibrillation: Secondary | ICD-10-CM

## 2011-08-20 DIAGNOSIS — Z791 Long term (current) use of non-steroidal anti-inflammatories (NSAID): Secondary | ICD-10-CM

## 2011-08-20 DIAGNOSIS — Z9089 Acquired absence of other organs: Secondary | ICD-10-CM

## 2011-08-20 DIAGNOSIS — E739 Lactose intolerance, unspecified: Secondary | ICD-10-CM

## 2011-08-20 DIAGNOSIS — Z9889 Other specified postprocedural states: Secondary | ICD-10-CM

## 2011-08-20 DIAGNOSIS — Z9049 Acquired absence of other specified parts of digestive tract: Secondary | ICD-10-CM

## 2011-08-20 DIAGNOSIS — K5730 Diverticulosis of large intestine without perforation or abscess without bleeding: Secondary | ICD-10-CM

## 2011-08-20 DIAGNOSIS — K219 Gastro-esophageal reflux disease without esophagitis: Secondary | ICD-10-CM

## 2011-08-20 DIAGNOSIS — K6389 Other specified diseases of intestine: Secondary | ICD-10-CM

## 2011-08-20 DIAGNOSIS — Z8719 Personal history of other diseases of the digestive system: Secondary | ICD-10-CM

## 2011-08-20 DIAGNOSIS — Z7901 Long term (current) use of anticoagulants: Secondary | ICD-10-CM

## 2011-08-20 DIAGNOSIS — Z79899 Other long term (current) drug therapy: Secondary | ICD-10-CM

## 2011-08-20 LAB — IBC PANEL
Iron: 72 ug/dL (ref 42–165)
Transferrin: 290.1 mg/dL (ref 212.0–360.0)

## 2011-08-20 MED ORDER — RIFAXIMIN 550 MG PO TABS: 550.0000 mg | ORAL_TABLET | Freq: Two times a day (BID) | ORAL | Status: DC

## 2011-08-20 MED ORDER — DEXLANSOPRAZOLE 60 MG PO CPDR: 60.0000 mg | DELAYED_RELEASE_CAPSULE | Freq: Every day | ORAL | Status: DC

## 2011-08-20 NOTE — Patient Instructions (Addendum)
Your physician has requested that you go to the basement for the following lab work before leaving today: We have sent the following medications to your mail order pharmacy in place of your Prilosec: Dexilant. Please start taking the Xifaxan samples one tablet by mouth twice daily x 2 weeks. Also take a over the counter probiotic called VSL # 3. We have given you information on Artifical Sweeteners.   Lactose Intolerance, Adult Lactose intolerance is when the body is not able to digest lactose, a sugar found in milk and milk products. Lactose intolerance is caused by your body not producing enough of the enzyme lactase. When there is not enough lactase to digest the amount of lactose consumed, discomfort may be felt. Lactose intolerance is not a milk allergy. For most people, lactase deficiency is a condition that develops naturally over time. After about the age of 2, the body begins to produce less lactase. But many people may not experience symptoms until they are much older. CAUSES Things that can cause you to be lactose intolerant include:  Aging.   Being born without the ability to make lactase.   Certain digestive diseases.   Injuries to the small intestine.  SYMPTOMS   Feeling sick to your stomach (nauseous).   Diarrhea.   Cramps.   Bloating.   Gas.  Symptoms usually show up a half hour or 2 hours after eating or drinking products containing lactose. TREATMENT  No treatment can improve the body's ability to produce lactase. However, symptoms can be controlled through diet. A medicine may be given to you to take when you consume lactose-containing foods or drinks. The medicine contains the lactase enzyme, which help the body digest lactose better. HOME CARE INSTRUCTIONS  Eat or drink dairy products as told by your caregiver or dietician.   Take all medicine as directed by your caregiver.   Find lactose-free or lactose-reduced products at your local grocery store.   Talk  to your caregiver or dietician to decide if you need any dietary supplements.  The following is the amount of calcium needed from the diet:  19 to 50 years: 1000 mg   Over 50 years: 1200 mg  Calcium and Lactose in Common Foods Non-Dairy Products / Calcium Content (mg)  Calcium-fortified orange juice, 1 cup / 308 to 344 mg   Sardines, with edible bones, 3 oz / 270 mg   Salmon, canned, with edible bones, 3 oz / 205 mg   Soymilk, fortified, 1 cup / 200 mg   Broccoli (raw), 1 cup / 90 mg   Orange, 1 medium / 50 mg   Pinto beans,  cup / 40 mg   Tuna, canned, 3 oz / 10 mg   Lettuce greens,  cup / 10 mg  Dairy Products / Calcium Content (mg) / Lactose Content (g)  Yogurt, plain, low-fat, 1 cup / 415 mg / 5 g   Milk, reduced fat, 1 cup / 295 mg / 11 g   Swiss cheese, 1 oz / 270 mg / 1 g   Ice cream,  cup / 85 mg / 6 g   Cottage cheese,  cup / 75 mg / 2 to 3 g  SEEK MEDICAL CARE IF: You have no relief from your symptoms. Document Released: 01/25/2005 Document Revised: 01/14/2011 Document Reviewed: 04/24/2010 [facility] Patient Information 2012 [place], [place].   cc: Governor Specking, MD

## 2011-08-20 NOTE — Progress Notes (Signed)
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 He's had multiple surgical procedures including vagotomy and pyloroplasty in 1984 because of a bleeding duodenal ulcer. Apparently the time of surgery he had a negative pancreatic biopsy. He has had followup endoscopy with negative biopsies for H. pylori. He does have a large duodenal diverticulum, has had recurrent episodes of suspected bacterial overgrowth syndrome with previous good response is to Xifaxan and probiotics therapy. He currently has some early satiety, belching, burping, and apparently bad smelling burps. Other surgeries include cholecystectomy, but no bowel surgery otherwise. He does take Advil 2 tablets twice a day. Because of atrial fibrillation, and in her with her he is on Coumadin and several cardiac medications, and is followed by Dr. Kelle Darting and Dr. Marca Ancona and cardiology. For many years patient was not lactose intolerant, but this seems to be a big problem over the last 2 years. He does not abuse other nonabsorbable carbohydrates such as fructose or sorbitol. Patient denies rectal bleeding, melena, or lower nominal pain. He had approximate 10 pound weight loss over the last 2 years.  I have reviewed this patient's present history, medical and surgical past history, allergies and medications.     ROS: The remainder of the 10 point ROS is negative... mild general malaise but no fever, chills, or other systemic complaints. He denies a current cardiovascular pulmonary complaints. Review of his labs does show a mildly elevated PSA, but otherwise labs are normal. He relates he does yearly Hemoccult cards which have been guaiac negative.     Physical Exam: General well developed well nourished patient in no acute distress, appearing his stated age Eyes PERRLA,  no icterus, fundoscopic exam per opthamologist Skin no lesions noted Neck supple, no adenopathy, no thyroid enlargement, no tenderness Chest clear to percussion and auscultation Heart no significant murmurs, gallops or rubs noted Abdomen no hepatosplenomegaly masses or tenderness, BS normal. Well healed midline upper abdominal scar and also right upper quadrant scar. He is very high pitched active bowel sounds noted. There no abdominal bruits. Also there is no succussion splash noted. Extremities no acute joint lesions, edema, phlebitis or evidence of cellulitis. Neurologic patient oriented x 3, cranial nerves intact, no focal neurologic deficits noted. Psychological mental status normal and normal affect.  Assessment and plan: Symptoms most consistent with recurrent bacterial overgrowth syndrome associated with his previous vagotomy and pyloroplasty and associated duodenal diverticulum. The patient responded well previously  to Xifaxan and probiotic therapy. He obviously has severe lactose intolerance, and I have reviewed foods and medication he should avoid including other nonabsorbable carbohydrates. Because of his continued GERD symptoms, we have stopped Prilosec, and will start Dexilant 60 mg 30 minutes before breakfast. I have ordered celiac panel, serum B12, and also stool Elastase-1 level to determine possible pancreatic insufficiency. Patient has not used ethanol in over 20 years. Previous colonoscopy was negative except for diverticulosis. I will see him back in 6 weeks' time for followup. He made need followup colonoscopy, endoscopy, small bowel biopsy. Also he is to obtain VSL #3 daily as a probiotic trial. Otherwise his continue his medications as listed and reviewed per his multiple physicians. Please copy this note to Dr. Kelle Darting in primary care.  No diagnosis found.

## 2011-08-23 LAB — CELIAC PANEL 10
Endomysial Screen: NEGATIVE
Gliadin IgA: 2.7 U/mL (ref <20)
Gliadin IgG: 6.2 U/mL (ref <20)
IgA: 166 mg/dL (ref 68–379)
Tissue Transglut Ab: 6.4 U/mL (ref <20)
Tissue Transglutaminase Ab, IgA: 2.3 U/mL (ref <20)

## 2011-08-24 ENCOUNTER — Telehealth: Payer: Self-pay | Admitting: *Deleted

## 2011-08-24 DIAGNOSIS — E611 Iron deficiency: Secondary | ICD-10-CM

## 2011-08-24 MED ORDER — FERROUS FUM-IRON POLYSACCH-FA 162-115.2-1 MG PO CAPS: 1.0000 | ORAL_CAPSULE | Freq: Every day | ORAL | Status: DC

## 2011-08-24 NOTE — Telephone Encounter (Signed)
Notified pt of abnormal labs and the need to start tandem which pt requested to be sent to [facility]. Pt will come in for IFOB cards and stated understanding.

## 2011-08-24 NOTE — Telephone Encounter (Signed)
Message copied by Florene Glen on Tue Aug 24, 2011  3:57 PM ------      Message from: Jarold Motto, DAVID R      Created: Fri Aug 20, 2011  4:34 PM       Start tandem and needs to do IFOB !!!!

## 2011-08-25 ENCOUNTER — Telehealth: Payer: Self-pay | Admitting: *Deleted

## 2011-08-25 MED ORDER — HEMAX 150-1 MG PO TABS: 1.0000 | ORAL_TABLET | Freq: Every day | ORAL | Status: DC

## 2011-08-25 NOTE — Telephone Encounter (Signed)
Informed wife that they no longer make Tandem and have pt come by to pick up samples of HEMAX; wife stated understanding.

## 2011-08-25 NOTE — Telephone Encounter (Signed)
Per Dr Jarold Motto, pt had stool cards done at [facility], so we will cancel the IFOB testing. Pt reports he's doing well since he started the Xifaxan. We will check his iron/ferritin in 3 months. Reminder in.

## 2011-09-08 ENCOUNTER — Ambulatory Visit (INDEPENDENT_AMBULATORY_CARE_PROVIDER_SITE_OTHER): Payer: Medicare Other | Admitting: *Deleted

## 2011-09-08 DIAGNOSIS — Z7901 Long term (current) use of anticoagulants: Secondary | ICD-10-CM

## 2011-09-08 DIAGNOSIS — I4891 Unspecified atrial fibrillation: Secondary | ICD-10-CM

## 2011-09-08 LAB — POCT INR: INR: 2.1

## 2011-10-01 ENCOUNTER — Ambulatory Visit (INDEPENDENT_AMBULATORY_CARE_PROVIDER_SITE_OTHER): Payer: Medicare Other | Admitting: Gastroenterology

## 2011-10-01 ENCOUNTER — Encounter: Payer: Self-pay | Admitting: Gastroenterology

## 2011-10-01 VITALS — BP 144/80 | HR 60 | Ht 70.5 in | Wt 164.0 lb

## 2011-10-01 DIAGNOSIS — K6389 Other specified diseases of intestine: Secondary | ICD-10-CM

## 2011-10-01 DIAGNOSIS — I10 Essential (primary) hypertension: Secondary | ICD-10-CM

## 2011-10-01 DIAGNOSIS — K5730 Diverticulosis of large intestine without perforation or abscess without bleeding: Secondary | ICD-10-CM

## 2011-10-01 DIAGNOSIS — Z9049 Acquired absence of other specified parts of digestive tract: Secondary | ICD-10-CM

## 2011-10-01 DIAGNOSIS — Z9889 Other specified postprocedural states: Secondary | ICD-10-CM

## 2011-10-01 DIAGNOSIS — Z87898 Personal history of other specified conditions: Secondary | ICD-10-CM

## 2011-10-01 DIAGNOSIS — Z9089 Acquired absence of other organs: Secondary | ICD-10-CM

## 2011-10-01 MED ORDER — DEXLANSOPRAZOLE 60 MG PO CPDR: 60.0000 mg | DELAYED_RELEASE_CAPSULE | Freq: Every day | ORAL | Status: DC

## 2011-10-01 MED ORDER — RIFAXIMIN 200 MG PO TABS: 200.0000 mg | ORAL_TABLET | Freq: Every morning | ORAL | Status: DC

## 2011-10-01 NOTE — Patient Instructions (Addendum)
You have been given more Xifaxan and Dexilant and we sent a prescription to your pharmacy.  CC: Kelle Darting, M. D.

## 2011-10-01 NOTE — Progress Notes (Signed)
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 He recently was reevaluated and found to have recurrent bacterial overgrowth syndrome, and has responded 100% to 2 weeks of Xifaxan 550 mg twice a day with probiotic therapy. He had some very minor iron deficiency, and stool Hemoccult cards on several occasions have been guaiac negative. He uses PPI medications for when necessary acid reflux. Currently he is asymptomatic, has regular bowel movements, denies gas and bloating, and his sense of well being is greatly improved. He has multiple cardiovascular issues that well managed by cardiology, and he is on chronic antidepressants. He denies a specific food intolerances.  Current Medications, Allergies, Past Medical History, Past Surgical History, Family History and Social History were reviewed in [employer] record.  Pertinent Review of Systems Negative   Physical Exam: Blood pressure 144/80, pulse 60 and regular, weight 164 pounds with a BMI of 23.20. I cannot appreciate stigmata of chronic liver disease. His chest is entirely clear and he is in a regular rhythm without murmurs gallops or rubs. His abdomen shows no organomegaly, masses, tenderness, or distention. Bowel sounds are normal. Peripheral extremities are unremarkable, mental status is normal.   Assessment and Plan: This patient has recurrent bacterial overgrowth syndrome related to previous vagotomy, small bowel diverticula, and chronic PPI use. We have given him Xifaxan to use twice a day one week out of each month while continuing probiotic therapy. I've suggested he uses PPI medications as needed since he really does not have any ongoing upper GI complaints at this time. He is on Coumadin but not aspirin. His mild iron deficiency should be replenished with  several months of oral iron therapy which she will soon complete. I have asked him to do primary care followup with Dr. Kelle Darting as planned. Dr. Sherrie Mustache and I do have decided that he does not need further endoscopic procedures at this point in his life. Encounter Diagnoses  Name Primary?  Marland Kitchen HYPERTENSION Yes  . Bacterial overgrowth syndrome   . History of vagotomy   . Hx of ulcer disease   . Diverticulosis of colon (without mention of hemorrhage)

## 2011-10-06 ENCOUNTER — Ambulatory Visit (INDEPENDENT_AMBULATORY_CARE_PROVIDER_SITE_OTHER): Payer: Medicare Other | Admitting: *Deleted

## 2011-10-06 DIAGNOSIS — I4891 Unspecified atrial fibrillation: Secondary | ICD-10-CM

## 2011-10-06 DIAGNOSIS — Z7901 Long term (current) use of anticoagulants: Secondary | ICD-10-CM

## 2011-10-06 LAB — POCT INR: INR: 2.5

## 2011-11-17 ENCOUNTER — Ambulatory Visit (INDEPENDENT_AMBULATORY_CARE_PROVIDER_SITE_OTHER): Payer: Medicare Other | Admitting: *Deleted

## 2011-11-17 DIAGNOSIS — I4891 Unspecified atrial fibrillation: Secondary | ICD-10-CM

## 2011-11-17 DIAGNOSIS — Z7901 Long term (current) use of anticoagulants: Secondary | ICD-10-CM

## 2011-11-17 LAB — POCT INR: INR: 2.1

## 2011-11-30 ENCOUNTER — Telehealth: Payer: Self-pay | Admitting: *Deleted

## 2011-11-30 NOTE — Telephone Encounter (Signed)
Message copied by Florene Glen on Tue Nov 30, 2011  3:33 PM ------      Message from: Florene Glen      Created: Wed Aug 25, 2011  4:34 PM       Check iron studies after hemax iron use

## 2011-12-29 ENCOUNTER — Ambulatory Visit (INDEPENDENT_AMBULATORY_CARE_PROVIDER_SITE_OTHER): Payer: Medicare Other | Admitting: *Deleted

## 2011-12-29 DIAGNOSIS — I4891 Unspecified atrial fibrillation: Secondary | ICD-10-CM

## 2011-12-29 DIAGNOSIS — Z7901 Long term (current) use of anticoagulants: Secondary | ICD-10-CM

## 2011-12-29 LAB — POCT INR: INR: 2.4

## 2012-02-10 ENCOUNTER — Ambulatory Visit (INDEPENDENT_AMBULATORY_CARE_PROVIDER_SITE_OTHER): Payer: Medicare Other | Admitting: *Deleted

## 2012-02-10 DIAGNOSIS — I4891 Unspecified atrial fibrillation: Secondary | ICD-10-CM

## 2012-02-10 DIAGNOSIS — Z7901 Long term (current) use of anticoagulants: Secondary | ICD-10-CM

## 2012-02-10 LAB — POCT INR: INR: 3.1

## 2012-03-27 ENCOUNTER — Encounter: Payer: Self-pay | Admitting: *Deleted

## 2012-03-27 ENCOUNTER — Ambulatory Visit (INDEPENDENT_AMBULATORY_CARE_PROVIDER_SITE_OTHER): Payer: Medicare Other | Admitting: Pharmacist

## 2012-03-27 DIAGNOSIS — Z7901 Long term (current) use of anticoagulants: Secondary | ICD-10-CM

## 2012-03-27 DIAGNOSIS — I4891 Unspecified atrial fibrillation: Secondary | ICD-10-CM

## 2012-03-27 LAB — POCT INR: INR: 3.2

## 2012-04-03 ENCOUNTER — Other Ambulatory Visit: Payer: Self-pay | Admitting: *Deleted

## 2012-04-03 MED ORDER — ALBUTEROL SULFATE HFA 108 (90 BASE) MCG/ACT IN AERS: 2.0000 | INHALATION_SPRAY | Freq: Four times a day (QID) | RESPIRATORY_TRACT | Status: DC | PRN

## 2012-05-08 ENCOUNTER — Ambulatory Visit (INDEPENDENT_AMBULATORY_CARE_PROVIDER_SITE_OTHER): Payer: Medicare Other | Admitting: Pharmacist

## 2012-05-08 DIAGNOSIS — Z7901 Long term (current) use of anticoagulants: Secondary | ICD-10-CM

## 2012-05-08 DIAGNOSIS — I4891 Unspecified atrial fibrillation: Secondary | ICD-10-CM

## 2012-06-05 ENCOUNTER — Ambulatory Visit (INDEPENDENT_AMBULATORY_CARE_PROVIDER_SITE_OTHER): Payer: Medicare Other | Admitting: *Deleted

## 2012-06-05 DIAGNOSIS — I4891 Unspecified atrial fibrillation: Secondary | ICD-10-CM

## 2012-06-05 DIAGNOSIS — Z7901 Long term (current) use of anticoagulants: Secondary | ICD-10-CM

## 2012-06-05 LAB — POCT INR: INR: 2.6

## 2012-07-05 ENCOUNTER — Ambulatory Visit (INDEPENDENT_AMBULATORY_CARE_PROVIDER_SITE_OTHER): Payer: Medicare Other | Admitting: Pharmacist

## 2012-07-05 DIAGNOSIS — Z7901 Long term (current) use of anticoagulants: Secondary | ICD-10-CM

## 2012-07-05 DIAGNOSIS — I4891 Unspecified atrial fibrillation: Secondary | ICD-10-CM

## 2012-07-24 ENCOUNTER — Other Ambulatory Visit: Payer: Self-pay | Admitting: *Deleted

## 2012-07-24 DIAGNOSIS — I4891 Unspecified atrial fibrillation: Secondary | ICD-10-CM

## 2012-07-24 DIAGNOSIS — N401 Enlarged prostate with lower urinary tract symptoms: Secondary | ICD-10-CM

## 2012-07-24 DIAGNOSIS — F419 Anxiety disorder, unspecified: Secondary | ICD-10-CM

## 2012-07-24 DIAGNOSIS — E785 Hyperlipidemia, unspecified: Secondary | ICD-10-CM

## 2012-07-24 MED ORDER — BUPROPION HCL ER (SR) 150 MG PO TB12: 150.0000 mg | ORAL_TABLET | ORAL | Status: DC

## 2012-07-24 MED ORDER — ATENOLOL 50 MG PO TABS: 50.0000 mg | ORAL_TABLET | Freq: Every day | ORAL | Status: DC

## 2012-07-24 MED ORDER — TAMSULOSIN HCL 0.4 MG PO CAPS
0.4000 mg | ORAL_CAPSULE | Freq: Every day | ORAL | Status: DC
Start: 2012-07-24 — End: 2012-08-16

## 2012-07-24 MED ORDER — WARFARIN SODIUM 5 MG PO TABS: 5.0000 mg | ORAL_TABLET | Freq: Every day | ORAL | Status: DC

## 2012-08-07 ENCOUNTER — Ambulatory Visit (INDEPENDENT_AMBULATORY_CARE_PROVIDER_SITE_OTHER): Payer: Medicare Other | Admitting: *Deleted

## 2012-08-07 DIAGNOSIS — I4891 Unspecified atrial fibrillation: Secondary | ICD-10-CM

## 2012-08-07 DIAGNOSIS — Z7901 Long term (current) use of anticoagulants: Secondary | ICD-10-CM

## 2012-08-07 LAB — POCT INR: INR: 3.5

## 2012-08-16 ENCOUNTER — Encounter: Payer: Self-pay | Admitting: Family Medicine

## 2012-08-16 ENCOUNTER — Ambulatory Visit (INDEPENDENT_AMBULATORY_CARE_PROVIDER_SITE_OTHER): Payer: Medicare Other | Admitting: Family Medicine

## 2012-08-16 VITALS — BP 120/80 | Temp 97.8°F | Ht 70.5 in | Wt 165.0 lb

## 2012-08-16 DIAGNOSIS — I10 Essential (primary) hypertension: Secondary | ICD-10-CM

## 2012-08-16 DIAGNOSIS — J45909 Unspecified asthma, uncomplicated: Secondary | ICD-10-CM

## 2012-08-16 DIAGNOSIS — F419 Anxiety disorder, unspecified: Secondary | ICD-10-CM

## 2012-08-16 DIAGNOSIS — F411 Generalized anxiety disorder: Secondary | ICD-10-CM

## 2012-08-16 DIAGNOSIS — I5189 Other ill-defined heart diseases: Secondary | ICD-10-CM

## 2012-08-16 DIAGNOSIS — I2510 Atherosclerotic heart disease of native coronary artery without angina pectoris: Secondary | ICD-10-CM

## 2012-08-16 DIAGNOSIS — N401 Enlarged prostate with lower urinary tract symptoms: Secondary | ICD-10-CM

## 2012-08-16 DIAGNOSIS — I4891 Unspecified atrial fibrillation: Secondary | ICD-10-CM

## 2012-08-16 DIAGNOSIS — J309 Allergic rhinitis, unspecified: Secondary | ICD-10-CM

## 2012-08-16 DIAGNOSIS — I519 Heart disease, unspecified: Secondary | ICD-10-CM

## 2012-08-16 DIAGNOSIS — K219 Gastro-esophageal reflux disease without esophagitis: Secondary | ICD-10-CM

## 2012-08-16 DIAGNOSIS — Z0000 Encounter for general adult medical examination without abnormal findings: Secondary | ICD-10-CM

## 2012-08-16 DIAGNOSIS — E785 Hyperlipidemia, unspecified: Secondary | ICD-10-CM

## 2012-08-16 LAB — LIPID PANEL
HDL: 33.8 mg/dL — ABNORMAL LOW (ref >39.00)
LDL Cholesterol: 48 mg/dL (ref 0–99)
VLDL: 21.6 mg/dL (ref 0.0–40.0)

## 2012-08-16 LAB — POCT URINALYSIS DIPSTICK
Bilirubin, UA: NEGATIVE
Leukocytes, UA: NEGATIVE
Spec Grav, UA: 1.02

## 2012-08-16 LAB — CBC WITH DIFFERENTIAL/PLATELET
Basophils Relative: 0.3 % (ref 0.0–3.0)
Eosinophils Relative: 5.4 % — ABNORMAL HIGH (ref 0.0–5.0)
HCT: 41.4 % (ref 39.0–52.0)
MCV: 93.9 fl (ref 78.0–100.0)
Monocytes Absolute: 0.7 10*3/uL (ref 0.1–1.0)
Monocytes Relative: 8.2 % (ref 3.0–12.0)
Neutrophils Relative %: 74.9 % (ref 43.0–77.0)
Platelets: 198 10*3/uL (ref 150.0–400.0)
RBC: 4.41 Mil/uL (ref 4.22–5.81)
WBC: 8.4 10*3/uL (ref 4.5–10.5)

## 2012-08-16 LAB — BASIC METABOLIC PANEL
BUN: 20 mg/dL (ref 6–23)
GFR: 70.47 mL/min (ref >60.00)
Glucose, Bld: 100 mg/dL — ABNORMAL HIGH (ref 70–99)
Potassium: 5 mEq/L (ref 3.5–5.1)

## 2012-08-16 LAB — HEPATIC FUNCTION PANEL
Bilirubin, Direct: 0.1 mg/dL (ref 0.0–0.3)
Total Bilirubin: 0.6 mg/dL (ref 0.3–1.2)
Total Protein: 6.5 g/dL (ref 6.0–8.3)

## 2012-08-16 LAB — TSH: TSH: 3.09 u[IU]/mL (ref 0.35–5.50)

## 2012-08-16 LAB — PSA: PSA: 7.44 ng/mL — ABNORMAL HIGH (ref 0.10–4.00)

## 2012-08-16 MED ORDER — WARFARIN SODIUM 5 MG PO TABS: 5.0000 mg | ORAL_TABLET | Freq: Every day | ORAL | Status: DC

## 2012-08-16 MED ORDER — ATENOLOL 50 MG PO TABS: 50.0000 mg | ORAL_TABLET | Freq: Every day | ORAL | Status: DC

## 2012-08-16 MED ORDER — ALBUTEROL SULFATE HFA 108 (90 BASE) MCG/ACT IN AERS: 2.0000 | INHALATION_SPRAY | Freq: Four times a day (QID) | RESPIRATORY_TRACT | Status: DC | PRN

## 2012-08-16 MED ORDER — ATORVASTATIN CALCIUM 10 MG PO TABS: 20.0000 mg | ORAL_TABLET | Freq: Every day | ORAL | Status: DC

## 2012-08-16 MED ORDER — TAMSULOSIN HCL 0.4 MG PO CAPS: 0.4000 mg | ORAL_CAPSULE | Freq: Every day | ORAL | Status: DC

## 2012-08-16 MED ORDER — BECLOMETHASONE DIPROPIONATE 80 MCG/ACT IN AERS: INHALATION_SPRAY | RESPIRATORY_TRACT | Status: DC

## 2012-08-16 MED ORDER — MONTELUKAST SODIUM 10 MG PO TABS: 10.0000 mg | ORAL_TABLET | Freq: Every day | ORAL | Status: DC

## 2012-08-16 MED ORDER — BUPROPION HCL ER (SR) 150 MG PO TB12: 150.0000 mg | ORAL_TABLET | ORAL | Status: DC

## 2012-08-16 NOTE — Patient Instructions (Signed)
Increase the Qvar to one puff twice daily  Add Singulair 10 mg one tablet with your evening meal,,,,,,,,,,,,,, return in 4 weeks for followup  Continue all your other medicines as outlined

## 2012-08-16 NOTE — Progress Notes (Signed)
Subjective:    Patient ID: [name] [name], male    DOB: 04-22-1929, 79 y.o.   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 Fib on Coumadin followed by Dr. Sherlie Ban in cardiology  He gets routine eye care,,,,,, due to have his right cataract removed within the next 2 weeks followed by the left cataract removal by Dr. Maryelizabeth Kaufmann,,,,,,, due for dental care, colonoscopy is no longer indicated, vaccinations up-to-date .  Cognitive function normal he walks on a regular basis plays golf 3 or 4 days a week. Home health safety reviewed no issues identified, no guns in the house, he does have a health care power of attorney and living will   Review of Systems  Constitutional: Negative.   HENT: Negative.   Eyes: Negative.   Respiratory: Positive for cough and wheezing.   Cardiovascular: Negative.   Gastrointestinal: Negative.   Genitourinary: Negative.   Musculoskeletal: Negative.   Skin: Negative.   Neurological: Negative.   Psychiatric/Behavioral: Negative.        Objective:   Physical Exam  Nursing note and vitals reviewed. Constitutional: He is oriented to person, place, and time. He appears well-developed and well-nourished.  HENT:  Head: Normocephalic and atraumatic.  Right Ear: External ear normal.  Left Ear: External ear normal.  Nose: Nose normal.  Mouth/Throat: Oropharynx is clear and moist.  The beginnings of mild gum disease  Eyes: Conjunctivae and EOM are normal. Pupils are equal, round, and reactive to light. Right eye exhibits no discharge. Left eye exhibits no discharge. No scleral icterus.  Bilateral cataracts the one on the right is denser than the left  Neck: Normal range of motion. Neck supple. No JVD present. No  tracheal deviation present. No thyromegaly present.  Cardiovascular: Normal rate, regular rhythm, normal heart sounds and intact distal pulses.  Exam reveals no gallop and no friction rub.   No murmur heard. No carotid or aortic bruits peripheral pulses 2+ out of 2 and symmetrical  Pulmonary/Chest: Effort normal and breath sounds normal. No stridor. No respiratory distress. He has no wheezes. He has no rales. He exhibits no tenderness.  Abdominal: Soft. Bowel sounds are normal. He exhibits no distension and no mass. There is no tenderness. There is no rebound and no guarding.  Scar midline from previous abdominal surgery  Genitourinary: Rectum normal, prostate normal and penis normal. Guaiac negative stool. No penile tenderness.  Musculoskeletal: Normal range of motion. He exhibits no edema and no tenderness.  Lymphadenopathy:    He has no cervical adenopathy.  Neurological: He is alert and oriented to person, place, and time. He has normal reflexes. No cranial nerve deficit. He exhibits normal muscle tone.  Skin: Skin is warm and dry. No rash noted. No erythema. No pallor.  Total body skin exam normal he has a garden variety of freckles moles capillaries hemangiomas and seborrheic keratosis  Psychiatric: He has a normal mood and affect. His behavior is normal. Judgment and thought content normal.          Assessment & Plan:  Healthy male  History of asthma recently a flare ,,,,,,,,, uses albuterol 2-3 sometimes 4 times weekly,,,,,. We will double the Qvar add Singulair. If symptoms don't improve we'll get pulmonary consult  Hypertension on Tenormin 50 mg BP 120/80  Hyperlipidemia  check labs  History of reflux taking PPI OTC Prilosec daily and asymptomatic with elevation of his head at night  BPH with outlet obstruction on Flomax 0.4 daily  History of A. Fib on Coumadin followed in the Coumadin clinic  Bilateral cataracts right worse than left  Mild anxiety continue Wellbutrin  150 mg daily  History of coronary disease asymptomatic no chest pain or shortness of breath with exertion

## 2012-08-25 ENCOUNTER — Ambulatory Visit (INDEPENDENT_AMBULATORY_CARE_PROVIDER_SITE_OTHER): Payer: Medicare Other | Admitting: *Deleted

## 2012-08-25 DIAGNOSIS — I4891 Unspecified atrial fibrillation: Secondary | ICD-10-CM

## 2012-08-25 DIAGNOSIS — Z7901 Long term (current) use of anticoagulants: Secondary | ICD-10-CM

## 2012-09-20 ENCOUNTER — Telehealth: Payer: Self-pay | Admitting: Family Medicine

## 2012-09-20 DIAGNOSIS — J45909 Unspecified asthma, uncomplicated: Secondary | ICD-10-CM

## 2012-09-20 MED ORDER — BECLOMETHASONE DIPROPIONATE 80 MCG/ACT IN AERS: INHALATION_SPRAY | RESPIRATORY_TRACT | Status: DC

## 2012-09-20 NOTE — Telephone Encounter (Signed)
PT states that his beclomethasone (QVAR) 80 MCG/ACT inhaler should've sent to [facility] on [address], and it never made it. Please assist.

## 2012-09-20 NOTE — Telephone Encounter (Signed)
Rx sent to pharmacy   

## 2012-09-27 ENCOUNTER — Ambulatory Visit (INDEPENDENT_AMBULATORY_CARE_PROVIDER_SITE_OTHER): Payer: Medicare Other | Admitting: *Deleted

## 2012-09-27 DIAGNOSIS — I4891 Unspecified atrial fibrillation: Secondary | ICD-10-CM

## 2012-09-27 DIAGNOSIS — Z7901 Long term (current) use of anticoagulants: Secondary | ICD-10-CM

## 2012-10-11 ENCOUNTER — Ambulatory Visit: Payer: Medicare Other | Admitting: Cardiology

## 2012-10-20 ENCOUNTER — Ambulatory Visit (INDEPENDENT_AMBULATORY_CARE_PROVIDER_SITE_OTHER): Payer: Medicare Other | Admitting: *Deleted

## 2012-10-20 DIAGNOSIS — I4891 Unspecified atrial fibrillation: Secondary | ICD-10-CM

## 2012-10-20 DIAGNOSIS — Z7901 Long term (current) use of anticoagulants: Secondary | ICD-10-CM

## 2012-10-31 ENCOUNTER — Ambulatory Visit: Payer: Medicare Other | Admitting: Cardiology

## 2012-11-08 ENCOUNTER — Ambulatory Visit (INDEPENDENT_AMBULATORY_CARE_PROVIDER_SITE_OTHER): Payer: Medicare Other | Admitting: *Deleted

## 2012-11-08 DIAGNOSIS — Z7901 Long term (current) use of anticoagulants: Secondary | ICD-10-CM

## 2012-11-08 DIAGNOSIS — I4891 Unspecified atrial fibrillation: Secondary | ICD-10-CM

## 2012-11-08 LAB — POCT INR: INR: 2.1

## 2012-12-06 ENCOUNTER — Ambulatory Visit (INDEPENDENT_AMBULATORY_CARE_PROVIDER_SITE_OTHER): Payer: Medicare Other | Admitting: *Deleted

## 2012-12-06 DIAGNOSIS — I4891 Unspecified atrial fibrillation: Secondary | ICD-10-CM

## 2012-12-06 DIAGNOSIS — Z7901 Long term (current) use of anticoagulants: Secondary | ICD-10-CM

## 2012-12-21 ENCOUNTER — Ambulatory Visit (INDEPENDENT_AMBULATORY_CARE_PROVIDER_SITE_OTHER): Payer: Medicare Other | Admitting: Pharmacist

## 2012-12-21 ENCOUNTER — Ambulatory Visit (INDEPENDENT_AMBULATORY_CARE_PROVIDER_SITE_OTHER): Payer: Medicare Other | Admitting: Cardiology

## 2012-12-21 ENCOUNTER — Encounter: Payer: Self-pay | Admitting: Cardiology

## 2012-12-21 ENCOUNTER — Encounter: Payer: Self-pay | Admitting: *Deleted

## 2012-12-21 VITALS — BP 142/84 | HR 52 | Ht 70.5 in | Wt 167.0 lb

## 2012-12-21 DIAGNOSIS — I2510 Atherosclerotic heart disease of native coronary artery without angina pectoris: Secondary | ICD-10-CM

## 2012-12-21 DIAGNOSIS — I4891 Unspecified atrial fibrillation: Secondary | ICD-10-CM

## 2012-12-21 DIAGNOSIS — I498 Other specified cardiac arrhythmias: Secondary | ICD-10-CM

## 2012-12-21 DIAGNOSIS — I5189 Other ill-defined heart diseases: Secondary | ICD-10-CM

## 2012-12-21 DIAGNOSIS — I519 Heart disease, unspecified: Secondary | ICD-10-CM

## 2012-12-21 DIAGNOSIS — R001 Bradycardia, unspecified: Secondary | ICD-10-CM | POA: Insufficient documentation

## 2012-12-21 DIAGNOSIS — Z7901 Long term (current) use of anticoagulants: Secondary | ICD-10-CM

## 2012-12-21 DIAGNOSIS — E785 Hyperlipidemia, unspecified: Secondary | ICD-10-CM

## 2012-12-21 DIAGNOSIS — R0602 Shortness of breath: Secondary | ICD-10-CM

## 2012-12-21 LAB — BRAIN NATRIURETIC PEPTIDE: Pro B Natriuretic peptide (BNP): 96 pg/mL (ref 0.0–100.0)

## 2012-12-21 LAB — POCT INR: INR: 2.1

## 2012-12-21 NOTE — Patient Instructions (Addendum)
Decrease atenolol to 25mg  daily. You can take 1/2 of your 50mg  tablet daily.  Your physician recommends that you have lab work today--BNP  Your physician has requested that you have an exercise tolerance test. For further information please visit [email or web address] Please also follow instruction sheet, as given.   Your physician wants you to follow-up in: 6 months with Dr Shirlee Latch. (May 2015), You will receive a reminder letter in the mail two months in advance. If you don't receive a letter, please call our office to schedule the follow-up appointment.

## 2012-12-21 NOTE — Progress Notes (Signed)
Patient ID: [name] [name], male   DOB: 1929/05/30, 79 y.o.   MRN: [id number] PCP: Dr. 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  He had an RV infarct with chronic total occlusion of a small nondominant RCA in the 1980s.  He had a drug-eluting stent to the CFX in 11/10.    He is very symptomatic with atrial fibrillation episodes but they never last longer than a few hours hours.  Since I last saw him, he has had no symptomatic tachypalpitations.  He has had a chronic pattern of mild shortness of breath and mild left shoulder pain/chest aching when walking up a hill (x 20 years).  He thinks this may be a little worse over the last year.  He thinks he took notice of it because of a trip out west where he was at high altitude and got short of breath very easily. He still walks on a treadmill for 30 minutes daily with no exertional dyspnea or chest pain.  He can walk up 3 flights of steps without a problem.  He golfs 3-4 times a week.  BP is well-controlled at home.  Last echo in 6/12 showed EF 55-60% but there was an elevation in PA systolic pressure suggestive of diastolic dysfunction. Weight is stable.   ECG: Possible ectopic atrial rhythm, otherwise normal  Labs (6/11): LDL 63, HDL 35, K 5.5, creatinine 1.1 Labs (6/12): LDL 58, HDL 53, TSH normal, K 4.7, creatinine 1 Labs (12/12): BNP 79 Labs (7/14): K 5, creatinine 1.1, LDL 48, HDL 34  PMH: 1. CAD: RV infarct in the 1980s.  Chronic total occlusion of a small nondominant RCA.  11/10 PCI to CFX with Xience DES.  EF 60% on LV-gram at that time.  2.  Paroxysmal atrial fibrillation: Last symptomatic episode in fall 2010.  3.  Hyperlipidemia 4. GERD 5. HTN 6. Asthma 7. Suspected diastolic LV dysfunction: Echo (6/12): EF 55-60%, mild LV hypertrophy, no regional WMAs, mild AI, mild MR, mild-moderate biatrial enlargement, PA systolic pressure 51 mmHg.   SH: Nonsmoker.   Retired Marine scientist, lives in [place].  2 daughters.  Originally from [place].   FH: Brother with CABG.   ROS: All systems reviewed and negative except as per HPI.    Current Outpatient Prescriptions  Medication Sig Dispense Refill  . albuterol (PROVENTIL HFA) 108 (90 BASE) MCG/ACT inhaler Inhale 2 puffs into the lungs every 6 (six) hours as needed for wheezing.  18 g  6  . atenolol (TENORMIN) 50 MG tablet 1/2 tablet (total 25mg ) daily      . atorvastatin (LIPITOR) 10 MG tablet Take 2 tablets (20 mg total) by mouth daily.  100 tablet  3  . beclomethasone (QVAR) 80 MCG/ACT inhaler 1 puff twice a day  3 Inhaler  4  . buPROPion (WELLBUTRIN SR) 150 MG 12 hr tablet Take 1 tablet (150 mg total) by mouth 1 day or 1 dose.  100 tablet  3  . Cholecalciferol (VITAMIN D3) 400 UNITS CAPS Take by mouth daily.        . montelukast (SINGULAIR) 10 MG tablet Take 1 tablet (10 mg total) by mouth at bedtime.  100 tablet  3  . Multiple Vitamins-Minerals (MULTIVITAL) tablet Take 1 tablet by mouth daily.        . NON FORMULARY Pt on study drug       . tamsulosin (FLOMAX) 0.4 MG CAPS Take 1  capsule (0.4 mg total) by mouth daily.  100 capsule  3  . warfarin (COUMADIN) 5 MG tablet Take 1 tablet (5 mg total) by mouth daily.  90 tablet  3   No current facility-administered medications for this visit.    BP 142/84  Pulse 52  Ht 5' 10.5" (1.791 m)  Wt 167 lb (75.751 kg)  BMI 23.62 kg/m2 General: NAD Neck: No JVD, no thyromegaly or thyroid nodule.  Lungs: Clear to auscultation bilaterally with normal respiratory effort. CV: Nondisplaced PMI.  Heart regular S1/S2, soft S4, no murmur.  No peripheral edema.  No carotid bruit.  Normal pedal pulses.  Abdomen: Soft, nontender, no hepatosplenomegaly, no distention.  Neurologic: Alert and oriented x 3.  Psych: Normal affect. Extremities: No clubbing or cyanosis.   Assessment/Plan: 1. 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  He thinks that this has gotten a bit worse over the last year, mainly the dyspnea.  He is still able to golf, walk on a treadmill, etc with no problem.  No ischemic changes on ECG.   - I will have him do an ETT to assess exercise tolerance and to assess for ischemia.  - He is on warfarin without ASA given stable CAD.   - Continue statin, beta blocker.  2. Bradycardia: HR 52 here today.  He has noted that it is lower than in the past when he takes his pulse at home.  No lightheadedness or syncope.  I will have him decrease atenolol to 25 mg daily to see if he is being over-beta blocked.  This might help his stamina/dyspnea.  If it lowers his anginal threshold, would use Imdur.  3. Hyperlipidemia: Good lipids in 7/14, continue current atorvastatin.   4. Diastolic dysfunction: Echo in 2/13 suggested diastolic dysfunction.  He is not volume overloaded on exam.  I will get a BNP.  5. Atrial fibrillation: Paroxysmal.  No recent episodes.  Continue warfarin.   Marca Ancona 12/21/2012

## 2013-01-03 ENCOUNTER — Ambulatory Visit (INDEPENDENT_AMBULATORY_CARE_PROVIDER_SITE_OTHER): Payer: Medicare Other | Admitting: Cardiology

## 2013-01-03 DIAGNOSIS — E785 Hyperlipidemia, unspecified: Secondary | ICD-10-CM

## 2013-01-03 DIAGNOSIS — I4891 Unspecified atrial fibrillation: Secondary | ICD-10-CM

## 2013-01-03 DIAGNOSIS — R0602 Shortness of breath: Secondary | ICD-10-CM

## 2013-01-03 DIAGNOSIS — I2510 Atherosclerotic heart disease of native coronary artery without angina pectoris: Secondary | ICD-10-CM

## 2013-01-03 NOTE — Progress Notes (Signed)
Exercise Treadmill Test  Pre-Exercise Testing Evaluation Rhythm: normal sinus  Rate: 64 bpm     Test  Exercise Tolerance Test Ordering MD: Marca Ancona, MD  Interpreting MD: Marca Ancona, MD  Unique Test No: 1  Treadmill:  1  Indication for ETT: exertional dyspnea  Contraindication to ETT: No   Stress Modality: exercise - treadmill  Cardiac Imaging Performed: non   Protocol: standard Bruce - maximal  Max BP:  153/65  Max MPHR (bpm):  139 85% MPR (bpm):  118  MPHR obtained (bpm):  130 % MPHR obtained:  93  Reached 85% MPHR (min:sec):  8:00 Total Exercise Time (min-sec):  9:07  Workload in METS:  10.2 Borg Scale: 14  Reason ETT Terminated:  Dyspnea. No chest or shoulder pain.     ST Segment Analysis At Rest: normal ST segments - no evidence of significant ST depression With Exercise: Slight upsloping ST depression inferiorly and anterolaterally. No significant ST depression.  Rare PVCs with exercise.   Other Information Arrhythmia:  Yes Angina during ETT:  Rare PVCs with exercise.  Quality of ETT:  diagnostic  ETT Interpretation:  normal - no evidence of ischemia by ST analysis  Comments: Continue atenolol at lower dose, 25 mg daily.

## 2013-01-18 ENCOUNTER — Ambulatory Visit (INDEPENDENT_AMBULATORY_CARE_PROVIDER_SITE_OTHER): Payer: Medicare Other | Admitting: *Deleted

## 2013-01-18 DIAGNOSIS — I4891 Unspecified atrial fibrillation: Secondary | ICD-10-CM

## 2013-01-18 DIAGNOSIS — Z7901 Long term (current) use of anticoagulants: Secondary | ICD-10-CM

## 2013-01-18 LAB — POCT INR: INR: 1.7

## 2013-02-05 ENCOUNTER — Ambulatory Visit (INDEPENDENT_AMBULATORY_CARE_PROVIDER_SITE_OTHER): Payer: Medicare Other

## 2013-02-05 DIAGNOSIS — Z7901 Long term (current) use of anticoagulants: Secondary | ICD-10-CM

## 2013-02-05 DIAGNOSIS — I4891 Unspecified atrial fibrillation: Secondary | ICD-10-CM

## 2013-02-19 ENCOUNTER — Ambulatory Visit (INDEPENDENT_AMBULATORY_CARE_PROVIDER_SITE_OTHER): Payer: Medicare Other | Admitting: *Deleted

## 2013-02-19 ENCOUNTER — Encounter: Payer: Medicare Other | Admitting: Physician Assistant

## 2013-02-19 DIAGNOSIS — I4891 Unspecified atrial fibrillation: Secondary | ICD-10-CM

## 2013-02-19 DIAGNOSIS — Z7901 Long term (current) use of anticoagulants: Secondary | ICD-10-CM

## 2013-02-19 LAB — POCT INR: INR: 2.9

## 2013-03-06 ENCOUNTER — Ambulatory Visit (INDEPENDENT_AMBULATORY_CARE_PROVIDER_SITE_OTHER): Payer: Medicare Other | Admitting: *Deleted

## 2013-03-06 DIAGNOSIS — Z7901 Long term (current) use of anticoagulants: Secondary | ICD-10-CM

## 2013-03-06 DIAGNOSIS — Z5181 Encounter for therapeutic drug level monitoring: Secondary | ICD-10-CM

## 2013-03-06 DIAGNOSIS — I4891 Unspecified atrial fibrillation: Secondary | ICD-10-CM

## 2013-03-06 LAB — POCT INR: INR: 3.1

## 2013-04-02 ENCOUNTER — Other Ambulatory Visit: Payer: Self-pay | Admitting: Family Medicine

## 2013-04-03 ENCOUNTER — Ambulatory Visit (INDEPENDENT_AMBULATORY_CARE_PROVIDER_SITE_OTHER): Payer: Medicare Other | Admitting: *Deleted

## 2013-04-03 DIAGNOSIS — Z7901 Long term (current) use of anticoagulants: Secondary | ICD-10-CM

## 2013-04-03 DIAGNOSIS — I4891 Unspecified atrial fibrillation: Secondary | ICD-10-CM

## 2013-04-03 DIAGNOSIS — Z5181 Encounter for therapeutic drug level monitoring: Secondary | ICD-10-CM

## 2013-04-03 LAB — POCT INR: INR: 1.2

## 2013-04-03 MED ORDER — WARFARIN SODIUM 5 MG PO TABS: ORAL_TABLET | ORAL | Status: DC

## 2013-04-10 ENCOUNTER — Ambulatory Visit (INDEPENDENT_AMBULATORY_CARE_PROVIDER_SITE_OTHER): Payer: Medicare Other | Admitting: *Deleted

## 2013-04-10 DIAGNOSIS — Z5181 Encounter for therapeutic drug level monitoring: Secondary | ICD-10-CM

## 2013-04-10 DIAGNOSIS — Z7901 Long term (current) use of anticoagulants: Secondary | ICD-10-CM

## 2013-04-10 DIAGNOSIS — I4891 Unspecified atrial fibrillation: Secondary | ICD-10-CM

## 2013-04-10 LAB — POCT INR: INR: 2.1

## 2013-04-11 ENCOUNTER — Other Ambulatory Visit: Payer: Self-pay | Admitting: Family Medicine

## 2013-04-24 ENCOUNTER — Ambulatory Visit (INDEPENDENT_AMBULATORY_CARE_PROVIDER_SITE_OTHER): Payer: Medicare Other | Admitting: *Deleted

## 2013-04-24 DIAGNOSIS — I4891 Unspecified atrial fibrillation: Secondary | ICD-10-CM

## 2013-04-24 DIAGNOSIS — Z5181 Encounter for therapeutic drug level monitoring: Secondary | ICD-10-CM

## 2013-04-24 DIAGNOSIS — Z7901 Long term (current) use of anticoagulants: Secondary | ICD-10-CM

## 2013-04-24 LAB — POCT INR: INR: 2.4

## 2013-05-15 ENCOUNTER — Ambulatory Visit (INDEPENDENT_AMBULATORY_CARE_PROVIDER_SITE_OTHER): Payer: Medicare Other | Admitting: *Deleted

## 2013-05-15 DIAGNOSIS — I4891 Unspecified atrial fibrillation: Secondary | ICD-10-CM

## 2013-05-15 DIAGNOSIS — Z7901 Long term (current) use of anticoagulants: Secondary | ICD-10-CM

## 2013-05-15 DIAGNOSIS — Z5181 Encounter for therapeutic drug level monitoring: Secondary | ICD-10-CM

## 2013-05-15 LAB — POCT INR: INR: 2.5

## 2013-05-24 ENCOUNTER — Telehealth: Payer: Self-pay | Admitting: Internal Medicine

## 2013-05-24 NOTE — Telephone Encounter (Signed)
Looks like pt was last seen 01/13/2006.  Called and spoke with Dr. Caryn Section. Pt c/o increase SOB, prod cough w/ lots of clear-yellow phlem and wheezing. All going on x several months getting worse. He requests to be worked in to see [place] sooner than next available. Please advise thanks  No Known Allergies

## 2013-05-24 NOTE — Telephone Encounter (Signed)
Lets have patient come in on Wednesday 05-30-13 at 11:00am for 11:15am pulmonary consult-former patient with [facility]. Thanks.

## 2013-05-24 NOTE — Telephone Encounter (Signed)
Spoke with pt. appt scheduled. Nothing further needed 

## 2013-05-30 ENCOUNTER — Ambulatory Visit: Payer: Medicare Other | Admitting: Internal Medicine

## 2013-05-30 ENCOUNTER — Encounter: Payer: Self-pay | Admitting: Internal Medicine

## 2013-05-30 VITALS — BP 120/66 | HR 58 | Ht 71.0 in | Wt 160.6 lb

## 2013-05-30 DIAGNOSIS — J45909 Unspecified asthma, uncomplicated: Secondary | ICD-10-CM

## 2013-05-30 DIAGNOSIS — J309 Allergic rhinitis, unspecified: Secondary | ICD-10-CM

## 2013-05-30 MED ORDER — PREDNISONE 10 MG PO TABS: ORAL_TABLET | ORAL | Status: DC

## 2013-05-30 NOTE — Patient Instructions (Signed)
Neb xop 0.63  Script sent for prednisone taper  Sample Dymista nasal spray (antihistamine plus steroid) 1-2 puffs each nostril once daily at bedtime.   Try this instead of your current nasal spray. When the sample runs out, go back to the one you were using.  When the prednisone taper is done, you can go back to the Qvar steroid inhaler- maybe 2 puffs once daily would hold you.   Please call if I can help

## 2013-05-30 NOTE — Progress Notes (Signed)
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 Followed in past for asthma w/ bronchitis. LOV around 2008. Medical hx CAD/ MI/CABG, AFib/ warfarin, diastolic dysfunction, GERD He did well for several years with help from Dr Sherren Mocha. Starting about 6 months ago with no obvious trigger, he began having increased wheeze, cough, needing SABA 2-3x/ day and Qvar only intermittently. He has been worse this Spring, blaming pollen. Sputum remains clear. Denies fever, nodes, blood, edema, chest pain. Hx  Intermittent AFib- no pacemaker Had BCG in medical school.         Plays golf 2-3x/ week  Prior to Admission medications   Medication Sig Start Date End Date Taking? Authorizing Provider  albuterol (PROVENTIL HFA) 108 (90 BASE) MCG/ACT inhaler Inhale 2 puffs into the lungs every 6 (six) hours as needed for wheezing. 08/16/12  Yes [name], MD  atenolol (TENORMIN) 25 MG tablet Take 25 mg by mouth daily.   Yes Historical Provider, MD  atorvastatin (LIPITOR) 10 MG tablet Take 2 tablets (20 mg total) by mouth daily. 08/16/12  Yes [name], MD  beclomethasone (QVAR) 80 MCG/ACT inhaler 1 puff twice a day 09/20/12  Yes [name], MD  buPROPion [facility] SR) 150 MG 12 hr tablet Take 1 tablet (150 mg total) by mouth 1 day or 1 dose. 08/16/12  Yes [name], MD  Cholecalciferol (VITAMIN D3) 400 UNITS CAPS Take by mouth daily.     Yes Historical Provider, MD  ibuprofen (ADVIL,MOTRIN) 200 MG tablet Take 400 mg by mouth daily.   Yes Historical Provider, MD  loratadine (CLARITIN) 10 MG tablet Take 10 mg by mouth daily.   Yes Historical Provider, MD  montelukast (SINGULAIR) 10 MG tablet TAKE 1 TABLET BY MOUTH ONCE DAILY 04/02/13  Yes [name], MD  NON FORMULARY Pt on study drug    Yes Historical Provider, MD  omeprazole (PRILOSEC OTC) 20 MG tablet Take 20 mg by mouth daily.   Yes Historical Provider, MD  PROAIR HFA 108 (90  BASE) MCG/ACT inhaler INHALE 2 PUFFS INTO LUNGS EVERY 6 HOURS AS NEEDED FOR WHEEZING 04/11/13  Yes [name], MD  tamsulosin (FLOMAX) 0.4 MG CAPS Take 1 capsule (0.4 mg total) by mouth daily. 08/16/12  Yes [name], MD  warfarin (COUMADIN) 5 MG tablet Take as directed by coumadin clinic 04/03/13  Yes Larey Dresser, MD  predniSONE (DELTASONE) 10 MG tablet 4 X 2 DAYS, 3 X 2 DAYS, 2 X 2 DAYS, 1 X 2 DAYS 05/30/13   Deneise Lever, MD   Past Medical History  Diagnosis Date  . CAD (coronary artery disease)   . Sinus bradycardia   . Anxiety   . Asthma   . Atrial fibrillation   . GERD (gastroesophageal reflux disease) 12/30/2000  . Hyperlipidemia   . Hypertension   . Allergic rhinitis   . Diverticulosis 03/16/1995  . Hiatal hernia 12/30/2000  . History of duodenal ulcer 10/15/1986  . Heart attack 1984   Past Surgical History  Procedure Laterality Date  . Ent surgery  1972  . Hospital ccu  1984    heart attack, hypoplastic right coronary  . Total knee replacements      bilateral  . Coronary stent placement  2010  . Cholecystectomy  1989  . Vagotomy and pyloroplasty  1985    GI bleed  . Tonsillectomy     Family History  Problem Relation Age of  Onset  . Coronary artery disease Mother     deceased  . Deep vein thrombosis Sister   . Coronary artery disease Brother   . Asthma Brother   . Colon cancer Neg Hx    History   Social History  . Marital Status: Married    Spouse Name: N/A    Number of Children: 2  . Years of Education: N/A   Occupational History  . DOCTOR    Social History Main Topics  . Smoking status: Never Smoker   . Smokeless tobacco: Never Used  . Alcohol Use: No  . Drug Use: No  . Sexual Activity: Not on file   Other Topics Concern  . Not on file   Social History Narrative   Married. Regular exercise - yes.    ROS-see HPI Constitutional:   No-   weight loss, night sweats, fevers, chills, fatigue, lassitude. HEENT:   No-  headaches, difficulty  swallowing, tooth/dental problems, sore throat,       No-  sneezing, itching, ear ache, +nasal congestion, post nasal drip,  CV:  No-   chest pain, orthopnea, PND, swelling in lower extremities, anasarca,                                  dizziness, palpitations Resp: No-   shortness of breath with exertion or at rest.             +productive cough,  + non-productive cough,  No- coughing up of blood.              No-   change in color of mucus.  +wheezing.   Skin: No-   rash or lesions. GI:  No-   heartburn, indigestion, abdominal pain, nausea, vomiting, diarrhea,                 change in bowel habits, loss of appetite GU: No-   dysuria, change in color of urine, no urgency or frequency.  No- flank pain. MS:  No-   joint pain or swelling.  No- decreased range of motion.  No- back pain. Neuro-     nothing unusual Psych:  No- change in mood or affect. No depression or anxiety.  No memory loss.  OBJ- Physical Exam General- Alert, Oriented, Affect-appropriate, Distress- none acute Skin- rash-none, lesions- none, excoriation- none Lymphadenopathy- none Head- atraumatic            Eyes- Gross vision intact, PERRLA, conjunctivae and secretions clear            Ears- Hearing, canals-normal            Nose- Clear, no-Septal dev, mucus, polyps, erosion, perforation             Throat- Mallampati II , mucosa clear-not red , drainage- none, tonsils- atrophic. +Hoarse Neck- flexible , trachea midline, no stridor , thyroid nl, carotid no bruit Chest - symmetrical excursion , unlabored           Heart/CV- RRR , no murmur , no gallop  , no rub, nl s1 s2                           - JVD- none , edema- none, stasis changes- none, varices- none           Lung- clear to P&A, wheeze- none, cough- none while here , dullness-none, rub-  none           Chest wall-  Abd- tender-no, distended-no, bowel sounds-present, HSM- no Br/ Gen/ Rectal- Not done, not indicated Extrem- cyanosis- none, clubbing, none,  atrophy- none, strength- nl Neuro- grossly intact to observation

## 2013-05-30 NOTE — Assessment & Plan Note (Signed)
Increase this Spring c/w pollen rhinitis despite his age. Plan- Sample Dymista nasal spray. Once used up, try returning to usual nasal steroid while needed.

## 2013-05-30 NOTE — Assessment & Plan Note (Signed)
Subacute exacerbation began Fall, 2014. Does not sound bacterial. Exam is benign today. Plan- Neb xop, prednisone taper. Watch need for maintenance inhaler once he tapers off prednisone.

## 2013-06-11 ENCOUNTER — Other Ambulatory Visit: Payer: Self-pay | Admitting: Dermatology

## 2013-06-12 ENCOUNTER — Ambulatory Visit (INDEPENDENT_AMBULATORY_CARE_PROVIDER_SITE_OTHER): Payer: Medicare Other | Admitting: *Deleted

## 2013-06-12 DIAGNOSIS — I4891 Unspecified atrial fibrillation: Secondary | ICD-10-CM

## 2013-06-12 DIAGNOSIS — Z7901 Long term (current) use of anticoagulants: Secondary | ICD-10-CM

## 2013-06-12 DIAGNOSIS — Z5181 Encounter for therapeutic drug level monitoring: Secondary | ICD-10-CM

## 2013-06-12 LAB — POCT INR: INR: 2.6

## 2013-06-17 ENCOUNTER — Other Ambulatory Visit: Payer: Self-pay | Admitting: Family Medicine

## 2013-07-24 ENCOUNTER — Ambulatory Visit (INDEPENDENT_AMBULATORY_CARE_PROVIDER_SITE_OTHER): Payer: Medicare Other | Admitting: Pharmacist Clinician (PhC)/ Clinical Pharmacy Specialist

## 2013-07-24 DIAGNOSIS — I4891 Unspecified atrial fibrillation: Secondary | ICD-10-CM

## 2013-07-24 DIAGNOSIS — Z5181 Encounter for therapeutic drug level monitoring: Secondary | ICD-10-CM

## 2013-07-24 DIAGNOSIS — Z7901 Long term (current) use of anticoagulants: Secondary | ICD-10-CM

## 2013-07-24 LAB — POCT INR: INR: 2.7

## 2013-08-22 ENCOUNTER — Encounter: Payer: Medicare Other | Admitting: Family Medicine

## 2013-09-04 ENCOUNTER — Other Ambulatory Visit: Payer: Self-pay | Admitting: Family Medicine

## 2013-09-04 ENCOUNTER — Ambulatory Visit (INDEPENDENT_AMBULATORY_CARE_PROVIDER_SITE_OTHER): Payer: Medicare Other

## 2013-09-04 DIAGNOSIS — I4891 Unspecified atrial fibrillation: Secondary | ICD-10-CM

## 2013-09-04 DIAGNOSIS — Z7901 Long term (current) use of anticoagulants: Secondary | ICD-10-CM

## 2013-09-04 DIAGNOSIS — Z5181 Encounter for therapeutic drug level monitoring: Secondary | ICD-10-CM

## 2013-09-04 LAB — POCT INR: INR: 2.9

## 2013-09-08 ENCOUNTER — Other Ambulatory Visit: Payer: Self-pay | Admitting: Family Medicine

## 2013-09-12 ENCOUNTER — Encounter: Payer: Medicare Other | Admitting: Family Medicine

## 2013-09-17 ENCOUNTER — Ambulatory Visit (INDEPENDENT_AMBULATORY_CARE_PROVIDER_SITE_OTHER): Payer: Medicare Other | Admitting: Family Medicine

## 2013-09-17 ENCOUNTER — Encounter: Payer: Self-pay | Admitting: Family Medicine

## 2013-09-17 VITALS — BP 120/80 | Temp 97.5°F | Ht 70.0 in | Wt 160.0 lb

## 2013-09-17 DIAGNOSIS — I4891 Unspecified atrial fibrillation: Secondary | ICD-10-CM

## 2013-09-17 DIAGNOSIS — R351 Nocturia: Secondary | ICD-10-CM

## 2013-09-17 DIAGNOSIS — I10 Essential (primary) hypertension: Secondary | ICD-10-CM

## 2013-09-17 DIAGNOSIS — Z23 Encounter for immunization: Secondary | ICD-10-CM

## 2013-09-17 DIAGNOSIS — J45909 Unspecified asthma, uncomplicated: Secondary | ICD-10-CM

## 2013-09-17 DIAGNOSIS — F411 Generalized anxiety disorder: Secondary | ICD-10-CM

## 2013-09-17 DIAGNOSIS — I2510 Atherosclerotic heart disease of native coronary artery without angina pectoris: Secondary | ICD-10-CM

## 2013-09-17 DIAGNOSIS — E785 Hyperlipidemia, unspecified: Secondary | ICD-10-CM

## 2013-09-17 DIAGNOSIS — I482 Chronic atrial fibrillation, unspecified: Secondary | ICD-10-CM

## 2013-09-17 DIAGNOSIS — N401 Enlarged prostate with lower urinary tract symptoms: Secondary | ICD-10-CM

## 2013-09-17 DIAGNOSIS — F419 Anxiety disorder, unspecified: Secondary | ICD-10-CM

## 2013-09-17 DIAGNOSIS — K219 Gastro-esophageal reflux disease without esophagitis: Secondary | ICD-10-CM

## 2013-09-17 DIAGNOSIS — J309 Allergic rhinitis, unspecified: Secondary | ICD-10-CM

## 2013-09-17 LAB — POCT URINALYSIS DIPSTICK
BILIRUBIN UA: NEGATIVE
Glucose, UA: NEGATIVE
Ketones, UA: NEGATIVE
Leukocytes, UA: NEGATIVE
NITRITE UA: NEGATIVE
SPEC GRAV UA: 1.01
Urobilinogen, UA: 0.2
pH, UA: 6

## 2013-09-17 LAB — CBC WITH DIFFERENTIAL/PLATELET
BASOS ABS: 0 10*3/uL (ref 0.0–0.1)
Basophils Relative: 0.4 % (ref 0.0–3.0)
EOS PCT: 5.8 % — AB (ref 0.0–5.0)
Eosinophils Absolute: 0.5 10*3/uL (ref 0.0–0.7)
HEMATOCRIT: 42.7 % (ref 39.0–52.0)
Hemoglobin: 14.1 g/dL (ref 13.0–17.0)
Lymphocytes Relative: 13.3 % (ref 12.0–46.0)
Lymphs Abs: 1.1 10*3/uL (ref 0.7–4.0)
MCHC: 33 g/dL (ref 30.0–36.0)
MCV: 91.8 fl (ref 78.0–100.0)
MONO ABS: 0.7 10*3/uL (ref 0.1–1.0)
Monocytes Relative: 8.9 % (ref 3.0–12.0)
Neutro Abs: 5.9 10*3/uL (ref 1.4–7.7)
Neutrophils Relative %: 71.6 % (ref 43.0–77.0)
PLATELETS: 228 10*3/uL (ref 150.0–400.0)
RBC: 4.65 Mil/uL (ref 4.22–5.81)
RDW: 13.8 % (ref 11.5–15.5)
WBC: 8.2 10*3/uL (ref 4.0–10.5)

## 2013-09-17 LAB — HEPATIC FUNCTION PANEL
ALT: 17 U/L (ref 0–53)
AST: 17 U/L (ref 0–37)
Albumin: 3.9 g/dL (ref 3.5–5.2)
Alkaline Phosphatase: 85 U/L (ref 39–117)
BILIRUBIN TOTAL: 0.8 mg/dL (ref 0.2–1.2)
Bilirubin, Direct: 0.1 mg/dL (ref 0.0–0.3)
Total Protein: 7 g/dL (ref 6.0–8.3)

## 2013-09-17 LAB — PSA: PSA: 8.68 ng/mL — ABNORMAL HIGH (ref 0.10–4.00)

## 2013-09-17 LAB — LIPID PANEL
CHOLESTEROL: 95 mg/dL (ref 0–200)
HDL: 33.1 mg/dL — ABNORMAL LOW (ref >39.00)
LDL Cholesterol: 48 mg/dL (ref 0–99)
NonHDL: 61.9
Total CHOL/HDL Ratio: 3
Triglycerides: 70 mg/dL (ref 0.0–149.0)
VLDL: 14 mg/dL (ref 0.0–40.0)

## 2013-09-17 LAB — BASIC METABOLIC PANEL
BUN: 22 mg/dL (ref 6–23)
CO2: 28 mEq/L (ref 19–32)
Calcium: 9.4 mg/dL (ref 8.4–10.5)
Chloride: 103 mEq/L (ref 96–112)
Creatinine, Ser: 1.2 mg/dL (ref 0.4–1.5)
GFR: 64.03 mL/min (ref >60.00)
Glucose, Bld: 98 mg/dL (ref 70–99)
Potassium: 5.4 mEq/L — ABNORMAL HIGH (ref 3.5–5.1)
Sodium: 137 mEq/L (ref 135–145)

## 2013-09-17 LAB — TSH: TSH: 2.88 u[IU]/mL (ref 0.35–4.50)

## 2013-09-17 MED ORDER — TAMSULOSIN HCL 0.4 MG PO CAPS: ORAL_CAPSULE | ORAL | Status: DC

## 2013-09-17 MED ORDER — ALBUTEROL SULFATE HFA 108 (90 BASE) MCG/ACT IN AERS: 2.0000 | INHALATION_SPRAY | Freq: Four times a day (QID) | RESPIRATORY_TRACT | Status: DC | PRN

## 2013-09-17 MED ORDER — BUPROPION HCL ER (SR) 150 MG PO TB12: 150.0000 mg | ORAL_TABLET | ORAL | Status: DC

## 2013-09-17 MED ORDER — ATORVASTATIN CALCIUM 10 MG PO TABS: 20.0000 mg | ORAL_TABLET | Freq: Every day | ORAL | Status: DC

## 2013-09-17 MED ORDER — MONTELUKAST SODIUM 10 MG PO TABS: ORAL_TABLET | ORAL | Status: DC

## 2013-09-17 MED ORDER — OMEPRAZOLE MAGNESIUM 20 MG PO TBEC: 20.0000 mg | DELAYED_RELEASE_TABLET | Freq: Every day | ORAL | Status: DC

## 2013-09-17 MED ORDER — MOMETASONE FUROATE 50 MCG/ACT NA SUSP: 2.0000 | Freq: Every day | NASAL | Status: DC

## 2013-09-17 MED ORDER — BECLOMETHASONE DIPROPIONATE 80 MCG/ACT IN AERS: INHALATION_SPRAY | RESPIRATORY_TRACT | Status: DC

## 2013-09-17 MED ORDER — ATENOLOL 25 MG PO TABS: 25.0000 mg | ORAL_TABLET | Freq: Every day | ORAL | Status: DC

## 2013-09-17 NOTE — Progress Notes (Signed)
   Subjective:    Patient ID: [name] [name], male    DOB: [birth year], 80 y.o.   MRN: [id number]  HPI This is a 80 year old married male nonsmoker,,,,,,,,, retired Stage manager,,,,,,,,, who comes in today for evaluation of asthma, hypertension, hyperlipidemia, allergic rhinitis, mild anxiety, coronary disease status post bypass surgery, reflux esophagitis, BPH atrial fib on chronic Coumadin therapy  His med list reviewed there've been no changes  He states overall he feels well he had a cardiac stress test this past winter which was normal.  He gets routine eye care,,,,,,,, bilateral cataracts and lens implants,,,,,,,, regular dental care vaccinations updated by Apolonio Schneiders no longer needing colonoscopy  Cognitive function normal he plays golf 4 days weekly home health safety reviewed no issues identified, no guns in the house, he does have a health care power of attorney and living well   Review of Systems  Constitutional: Negative.   HENT: Negative.   Eyes: Negative.   Respiratory: Negative.   Cardiovascular: Negative.   Gastrointestinal: Negative.   Genitourinary: Negative.   Musculoskeletal: Negative.   Skin: Negative.   Neurological: Negative.   Psychiatric/Behavioral: Negative.        Objective:   Physical Exam  Nursing note and vitals reviewed. Constitutional: He is oriented to person, place, and time. He appears well-developed and well-nourished.  HENT:  Head: Normocephalic and atraumatic.  Right Ear: External ear normal.  Left Ear: External ear normal.  Nose: Nose normal.  Mouth/Throat: Oropharynx is clear and moist.  Eyes: Conjunctivae and EOM are normal. Pupils are equal, round, and reactive to light.  Neck: Normal range of motion. Neck supple. No JVD present. No tracheal deviation present. No thyromegaly present.  Cardiovascular: Normal rate, regular rhythm, normal heart sounds and intact distal pulses.  Exam reveals no gallop and no friction rub.   No murmur  heard. Pulmonary/Chest: Effort normal and breath sounds normal. No stridor. No respiratory distress. He has no wheezes. He has no rales. He exhibits no tenderness.  Abdominal: Soft. Bowel sounds are normal. He exhibits no distension and no mass. There is no tenderness. There is no rebound and no guarding.  Genitourinary: Rectum normal and penis normal. Guaiac negative stool. No penile tenderness.  2+ symmetrical nonnodular BPH  Musculoskeletal: Normal range of motion. He exhibits no edema and no tenderness.  Scars from bilateral knee replacements  Lymphadenopathy:    He has no cervical adenopathy.  Neurological: He is alert and oriented to person, place, and time. He has normal reflexes. No cranial nerve deficit. He exhibits normal muscle tone.  Skin: Skin is warm and dry. No rash noted. No erythema. No pallor.  Total body skin exam normal also followed by [facility]  Psychiatric: He has a normal mood and affect. His behavior is normal. Judgment and thought content normal.          Assessment & Plan:  Healthy male  Hypertension at goal continue current therapy  History of AF sinus rhythm today,,,,,,,,,,, continue current therapy  History of asthma continue inhalers  Allergic rhinitis continue Zyrtec steroid nasal spray and Singulair  History of mild anxiety continue Wellbutrin  Reflux esophagitis continue Prilosec  BPH with outlet obstruction continue Flomax 0.4 daily  Status post coronary artery disease and surgery asymptomatic recent stress test normal,

## 2013-09-17 NOTE — Patient Instructions (Signed)
Continue your current medications  Return in September 2016 for your annual exam

## 2013-09-17 NOTE — Progress Notes (Signed)
Pre visit review using our clinic review tool, if applicable. No additional management support is needed unless otherwise documented below in the visit note. 

## 2013-09-18 ENCOUNTER — Telehealth: Payer: Self-pay | Admitting: Family Medicine

## 2013-09-18 NOTE — Telephone Encounter (Signed)
Relevant patient education assigned to patient using Emmi. ° °

## 2013-09-22 ENCOUNTER — Other Ambulatory Visit: Payer: Self-pay | Admitting: Family Medicine

## 2013-10-16 ENCOUNTER — Ambulatory Visit (INDEPENDENT_AMBULATORY_CARE_PROVIDER_SITE_OTHER): Payer: Medicare Other | Admitting: *Deleted

## 2013-10-16 DIAGNOSIS — I4891 Unspecified atrial fibrillation: Secondary | ICD-10-CM

## 2013-10-16 DIAGNOSIS — Z5181 Encounter for therapeutic drug level monitoring: Secondary | ICD-10-CM

## 2013-10-16 DIAGNOSIS — Z7901 Long term (current) use of anticoagulants: Secondary | ICD-10-CM

## 2013-10-16 LAB — POCT INR: INR: 2.8

## 2013-10-19 ENCOUNTER — Other Ambulatory Visit: Payer: Self-pay | Admitting: Cardiology

## 2013-11-27 ENCOUNTER — Ambulatory Visit (INDEPENDENT_AMBULATORY_CARE_PROVIDER_SITE_OTHER): Payer: Medicare Other | Admitting: *Deleted

## 2013-11-27 DIAGNOSIS — I4891 Unspecified atrial fibrillation: Secondary | ICD-10-CM

## 2013-11-27 DIAGNOSIS — Z7901 Long term (current) use of anticoagulants: Secondary | ICD-10-CM

## 2013-11-27 DIAGNOSIS — Z5181 Encounter for therapeutic drug level monitoring: Secondary | ICD-10-CM

## 2013-11-27 LAB — POCT INR: INR: 3

## 2014-01-10 ENCOUNTER — Ambulatory Visit (INDEPENDENT_AMBULATORY_CARE_PROVIDER_SITE_OTHER): Payer: Medicare Other | Admitting: *Deleted

## 2014-01-10 DIAGNOSIS — I4891 Unspecified atrial fibrillation: Secondary | ICD-10-CM

## 2014-01-10 DIAGNOSIS — Z7901 Long term (current) use of anticoagulants: Secondary | ICD-10-CM

## 2014-01-10 DIAGNOSIS — Z5181 Encounter for therapeutic drug level monitoring: Secondary | ICD-10-CM

## 2014-01-10 LAB — POCT INR: INR: 2.4

## 2014-01-15 ENCOUNTER — Other Ambulatory Visit: Payer: Self-pay | Admitting: Cardiology

## 2014-02-14 ENCOUNTER — Other Ambulatory Visit: Payer: Self-pay | Admitting: *Deleted

## 2014-02-14 MED ORDER — PREDNISONE 20 MG PO TABS: 20.0000 mg | ORAL_TABLET | Freq: Every day | ORAL | Status: DC

## 2014-02-14 NOTE — Telephone Encounter (Signed)
Refill okay per Dr Sherren Mocha

## 2014-02-21 ENCOUNTER — Ambulatory Visit (INDEPENDENT_AMBULATORY_CARE_PROVIDER_SITE_OTHER): Payer: Medicare Other | Admitting: *Deleted

## 2014-02-21 DIAGNOSIS — Z7901 Long term (current) use of anticoagulants: Secondary | ICD-10-CM | POA: Diagnosis not present

## 2014-02-21 DIAGNOSIS — Z5181 Encounter for therapeutic drug level monitoring: Secondary | ICD-10-CM

## 2014-02-21 DIAGNOSIS — I4891 Unspecified atrial fibrillation: Secondary | ICD-10-CM

## 2014-02-21 LAB — POCT INR: INR: 3.1

## 2014-04-01 ENCOUNTER — Ambulatory Visit (INDEPENDENT_AMBULATORY_CARE_PROVIDER_SITE_OTHER): Payer: Medicare Other | Admitting: *Deleted

## 2014-04-01 DIAGNOSIS — Z5181 Encounter for therapeutic drug level monitoring: Secondary | ICD-10-CM

## 2014-04-01 DIAGNOSIS — Z7901 Long term (current) use of anticoagulants: Secondary | ICD-10-CM

## 2014-04-01 DIAGNOSIS — I4891 Unspecified atrial fibrillation: Secondary | ICD-10-CM | POA: Diagnosis not present

## 2014-04-01 LAB — POCT INR: INR: 3.4

## 2014-04-15 ENCOUNTER — Ambulatory Visit (INDEPENDENT_AMBULATORY_CARE_PROVIDER_SITE_OTHER): Payer: Medicare Other | Admitting: *Deleted

## 2014-04-15 DIAGNOSIS — Z5181 Encounter for therapeutic drug level monitoring: Secondary | ICD-10-CM | POA: Diagnosis not present

## 2014-04-15 DIAGNOSIS — Z7901 Long term (current) use of anticoagulants: Secondary | ICD-10-CM | POA: Diagnosis not present

## 2014-04-15 DIAGNOSIS — I4891 Unspecified atrial fibrillation: Secondary | ICD-10-CM | POA: Diagnosis not present

## 2014-04-15 LAB — POCT INR: INR: 2.7

## 2014-04-29 DIAGNOSIS — Z961 Presence of intraocular lens: Secondary | ICD-10-CM | POA: Diagnosis not present

## 2014-04-29 DIAGNOSIS — H02403 Unspecified ptosis of bilateral eyelids: Secondary | ICD-10-CM | POA: Diagnosis not present

## 2014-04-29 DIAGNOSIS — H43813 Vitreous degeneration, bilateral: Secondary | ICD-10-CM | POA: Diagnosis not present

## 2014-04-29 DIAGNOSIS — H26493 Other secondary cataract, bilateral: Secondary | ICD-10-CM | POA: Diagnosis not present

## 2014-05-06 ENCOUNTER — Ambulatory Visit (INDEPENDENT_AMBULATORY_CARE_PROVIDER_SITE_OTHER): Payer: Medicare Other

## 2014-05-06 DIAGNOSIS — Z7901 Long term (current) use of anticoagulants: Secondary | ICD-10-CM | POA: Diagnosis not present

## 2014-05-06 DIAGNOSIS — I4891 Unspecified atrial fibrillation: Secondary | ICD-10-CM

## 2014-05-06 DIAGNOSIS — Z5181 Encounter for therapeutic drug level monitoring: Secondary | ICD-10-CM

## 2014-05-06 LAB — POCT INR: INR: 2.3

## 2014-06-03 ENCOUNTER — Ambulatory Visit (INDEPENDENT_AMBULATORY_CARE_PROVIDER_SITE_OTHER): Payer: Medicare Other | Admitting: *Deleted

## 2014-06-03 DIAGNOSIS — Z5181 Encounter for therapeutic drug level monitoring: Secondary | ICD-10-CM

## 2014-06-03 DIAGNOSIS — I4891 Unspecified atrial fibrillation: Secondary | ICD-10-CM | POA: Diagnosis not present

## 2014-06-03 DIAGNOSIS — Z7901 Long term (current) use of anticoagulants: Secondary | ICD-10-CM | POA: Diagnosis not present

## 2014-06-03 LAB — POCT INR: INR: 1.8

## 2014-06-19 ENCOUNTER — Ambulatory Visit (INDEPENDENT_AMBULATORY_CARE_PROVIDER_SITE_OTHER): Payer: Medicare Other | Admitting: *Deleted

## 2014-06-19 DIAGNOSIS — Z5181 Encounter for therapeutic drug level monitoring: Secondary | ICD-10-CM | POA: Diagnosis not present

## 2014-06-19 DIAGNOSIS — Z7901 Long term (current) use of anticoagulants: Secondary | ICD-10-CM

## 2014-06-19 DIAGNOSIS — I4891 Unspecified atrial fibrillation: Secondary | ICD-10-CM | POA: Diagnosis not present

## 2014-06-19 LAB — POCT INR: INR: 3.7

## 2014-06-20 ENCOUNTER — Telehealth: Payer: Self-pay | Admitting: Internal Medicine

## 2014-06-20 ENCOUNTER — Ambulatory Visit (HOSPITAL_COMMUNITY): Payer: Medicare Other

## 2014-06-20 ENCOUNTER — Encounter: Payer: Self-pay | Admitting: Internal Medicine

## 2014-06-20 ENCOUNTER — Ambulatory Visit (INDEPENDENT_AMBULATORY_CARE_PROVIDER_SITE_OTHER): Payer: Medicare Other | Admitting: Internal Medicine

## 2014-06-20 VITALS — BP 120/82 | HR 68 | Ht 70.0 in | Wt 164.0 lb

## 2014-06-20 DIAGNOSIS — I480 Paroxysmal atrial fibrillation: Secondary | ICD-10-CM

## 2014-06-20 DIAGNOSIS — J45909 Unspecified asthma, uncomplicated: Secondary | ICD-10-CM | POA: Diagnosis not present

## 2014-06-20 MED ORDER — FLUTICASONE FUROATE-VILANTEROL 200-25 MCG/INH IN AEPB: 1.0000 | INHALATION_SPRAY | Freq: Every day | RESPIRATORY_TRACT | Status: AC

## 2014-06-20 MED ORDER — LEVALBUTEROL HCL 0.63 MG/3ML IN NEBU
0.6300 mg | INHALATION_SOLUTION | Freq: Once | RESPIRATORY_TRACT | Status: AC
  Administered 2014-06-20: 0.63 mg via RESPIRATORY_TRACT

## 2014-06-20 MED ORDER — AMOXICILLIN 500 MG PO CAPS: 500.0000 mg | ORAL_CAPSULE | Freq: Three times a day (TID) | ORAL | Status: DC

## 2014-06-20 NOTE — Patient Instructions (Addendum)
Neb xop 0.63  Suggest you use your prednisone to take 10 mg daily through the week end.  Try adding sample Breo Ellipta 200   1 puff then rinse mouth, once daily This is a maintenance controller with bronchodilator and topical steroid effect. Try this for now instead of Qvar.   You can still use your albuterol rescue inhaler if needed  Script printed to hold for amoxacillin, in case this begins to get more bacterial, with dark sputum, fever, etc.

## 2014-06-20 NOTE — Assessment & Plan Note (Signed)
Viral syndrome upper respiratory infection has moved into his chest with significant wheeze component. I don't think this is a bacterial infection but we are giving print antibiotic to hold. He reports an intolerance to higher dose prednisone, finding it tends to trigger his atrial fib. Plan-he will use his left over supply of prednisone taking 10 mg daily for the next 3 or 4 days. Add Breo Ellipta 200 sample, hoping he will tolerate the maintenance level of bronchodilator stimulation. Xopenex nebulizer treatment. Amoxicillin to hold.

## 2014-06-20 NOTE — Telephone Encounter (Signed)
Spoke with pt wife, pt is scheduled to see Dr Annamaria Boots at 11:15 today (06/20/14) Dr Annamaria Boots is okay with this .  Nothing further needed.

## 2014-06-20 NOTE — Telephone Encounter (Signed)
Dr Coralie Keens just called me. 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 Apparently having tough asthma attack last night. So needs to be seen in office 06/20/2014 AM  asap. Please arrange. I am going to sleep shortly. If cannot accommodate recommend ER

## 2014-06-20 NOTE — Assessment & Plan Note (Signed)
He has been tolerating twice daily albuterol rescue inhaler but reports that prednisone 20 mg daily is enough to trigger his paroxysmal atrial fibrillation. We discussed availability of Xopenex but we'll explore alternatives.

## 2014-06-20 NOTE — Progress Notes (Signed)
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 Followed in past for asthma w/ bronchitis. LOV around 2008. Medical hx CAD/ MI/CABG, AFib/ warfarin, diastolic dysfunction, GERD He did well for several years with help from Dr Sherren Mocha. Starting about 6 months ago with no obvious trigger, he began having increased wheeze, cough, needing SABA 2-3x/ day and Qvar only intermittently. He has been worse this Spring, blaming pollen. Sputum remains clear. Denies fever, nodes, blood, edema, chest pain. Hx  Intermittent AFib- no pacemaker Had BCG in medical school.         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 Followed in past for asthma w/ bronchitis.  Medical hx CAD/ MI/CABG,P AFib/ warfarin, diastolic dysfunction, GERD. Dr Coralie Keens alerted [place] that pt was having more trouble, so we could work him in today. Follows For: Pt c/o wheezing and SOB, prod cough with thick yellow mucus. States he had asthma flare up last night. Has has had 3 episodes of A-fib in last week, most recent was 06/18/14.  Persistent variable chest rattle has been going on almost since he was last seen here. Using albuterol rescue inhaler once or twice daily. He has caught a cold from his wife and overnight began or persistent wheeze, using rescue inhaler 4 times daily and continuing his Qvar. Transient relief only. Sputum trace yellow. No fever. He has a little left over prednisone but reports that 20 mg of prednisone daily is enough of a stimulant to trigger his atrial fib.  ROS-see HPI Constitutional:   No-   weight loss, night sweats, fevers, chills, fatigue, lassitude. HEENT:   No-  headaches, difficulty swallowing, tooth/dental problems, sore throat,       No-  sneezing, itching, ear ache, +nasal congestion, post nasal drip,  CV:  No-   chest pain,  orthopnea, PND, swelling in lower extremities, anasarca,                                  dizziness, palpitations Resp: No-   shortness of breath with exertion or at rest.             +productive cough,  + non-productive cough,  No- coughing up of blood.              No-   change in color of mucus.  +wheezing.   Skin: No-   rash or lesions. GI:  No-   heartburn, indigestion, abdominal pain, nausea, vomiting,  GU:  pain. MS:  No-   joint pain or swelling.   Neuro-     nothing unusual Psych:  No- change in mood or affect. No depression or anxiety.  No memory loss.  OBJ- Physical Exam General- Alert, Oriented, Affect-appropriate, Distress- none acute Skin- rash-none, lesions- none, excoriation- none Lymphadenopathy- none Head- atraumatic            Eyes- Gross vision intact, PERRLA, conjunctivae and secretions clear            Ears- Hearing aid +            Nose- + stuffy, no-Septal dev, mucus, polyps, erosion, perforation             Throat- Mallampati II , mucosa clear-not red , drainage- none, tonsils- atrophic. +Hoarse Neck- flexible , trachea midline, no stridor , thyroid nl,  carotid no bruit Chest - symmetrical excursion , unlabored           Heart/CV- RRR , no murmur , no gallop  , no rub, nl s1 s2                           - JVD- none , edema- none, stasis changes- none, varices- none           Lung-  wheeze + diffuse bilateral, cough+ , dullness-none, rub- none           Chest wall-  Abd-  Br/ Gen/ Rectal- Not done, not indicated Extrem- cyanosis- none, clubbing, none, atrophy- none, strength- nl Neuro- grossly intact to observation

## 2014-06-24 ENCOUNTER — Ambulatory Visit (INDEPENDENT_AMBULATORY_CARE_PROVIDER_SITE_OTHER): Payer: Medicare Other | Admitting: Family Medicine

## 2014-06-24 ENCOUNTER — Encounter: Payer: Self-pay | Admitting: Family Medicine

## 2014-06-24 ENCOUNTER — Ambulatory Visit
Admission: RE | Admit: 2014-06-24 | Discharge: 2014-06-24 | Disposition: A | Discharge status: Home / Self Care | Payer: Medicare Other | Source: Ambulatory Visit | Attending: Family Medicine | Admitting: Family Medicine

## 2014-06-24 ENCOUNTER — Other Ambulatory Visit: Payer: Self-pay | Admitting: Family Medicine

## 2014-06-24 ENCOUNTER — Telehealth: Payer: Self-pay | Admitting: *Deleted

## 2014-06-24 VITALS — BP 120/80 | HR 62 | Temp 96.5°F | Wt 163.0 lb

## 2014-06-24 DIAGNOSIS — R053 Chronic cough: Secondary | ICD-10-CM

## 2014-06-24 DIAGNOSIS — J45909 Unspecified asthma, uncomplicated: Secondary | ICD-10-CM | POA: Diagnosis not present

## 2014-06-24 DIAGNOSIS — R05 Cough: Secondary | ICD-10-CM

## 2014-06-24 DIAGNOSIS — R062 Wheezing: Secondary | ICD-10-CM

## 2014-06-24 DIAGNOSIS — R059 Cough, unspecified: Secondary | ICD-10-CM

## 2014-06-24 MED ORDER — ALBUTEROL SULFATE (2.5 MG/3ML) 0.083% IN NEBU
2.5000 mg | INHALATION_SOLUTION | RESPIRATORY_TRACT | Status: AC
  Administered 2014-06-24: 2.5 mg via RESPIRATORY_TRACT

## 2014-06-24 MED ORDER — ALBUTEROL SULFATE (2.5 MG/3ML) 0.083% IN NEBU: 2.5000 mg | INHALATION_SOLUTION | Freq: Four times a day (QID) | RESPIRATORY_TRACT | Status: DC | PRN

## 2014-06-24 MED ORDER — HYDROCODONE-HOMATROPINE 5-1.5 MG/5ML PO SYRP: 5.0000 mL | ORAL_SOLUTION | Freq: Three times a day (TID) | ORAL | Status: DC | PRN

## 2014-06-24 NOTE — Patient Instructions (Addendum)
Rest at home  Drink lots of water  Prednisone 20 mg,,,,,,,,,, one tablet daily until well then taper slowly,,,,,,,,,, 10 mg daily for 1 week,,,,,,,, then 10 mg Monday Wednesday Friday for 3 weeks  Home nebulizer with albuterol,,,,,,,,,,,,, use 3 times daily until clear   Hydromet,,,,,,,,, 1/2-1 teaspoon 3 times daily when necessary for cough  Continue the antibiotic  Call Thursday,,,,,,,, with a progress report  Continue the inhaled steroid,,,,,,,,,,, 2 puffs twice daily

## 2014-06-24 NOTE — Telephone Encounter (Signed)
Patient called to inform us that he is on antibiotics and other medications.  He states he is on Amoxicillin 500 mg three times a day, Mucinex 600 mg three times a day, and Prednisone 20 mg daily and it is a tapered dose. He states he started Prednisone on Friday.  Advised that the Prednisone can interfere with his INR level and we need to see him tomorrow. He verbalized understanding and appointment set for 06/25/14.

## 2014-06-24 NOTE — Progress Notes (Signed)
   Subjective:    Patient ID: [name] [name], male    DOB: 1930/01/17, 81 y.o.   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 He's on an inhaled steroid by his pulmonologist Dr. click young who he saw last Thursday. Dr. Annamaria Boots also gave him some amoxicillin 500 mg 3 times a day to take. He has albuterol which he takes 2 puffs twice a day to 4 times a day her flares.  He says he hasn't felt well since January. His wheezing is been intermittent although it hasn't been that bad that he wanted to seek medical care. His wife developed a viral infection about 2 weeks ago and he seemed to pick up her virus. He had no fever but he again bring up a lot of discolored sputum. Last Thursday saw Dr. Annamaria Boots who gave him prednisone 20 mg and advised him take 2 tablets daily. He only took 1 pill a day because the 40 mg triggers his A. fib. He then started the amoxicillin this past Sunday. No fever. He says the nebulizer helps but on a last for couple hours.   Review of Systems Review of systems otherwise negative    Objective:   Physical Exam Well-developed well-nourished male no acute distress vital signs stable he is afebrile pulse ox on room air 92% pulse 62 and regular  Pulmonary exam shows inspiratory and expiratory wheezing mild to moderate  Chest x-ray normal       Assessment & Plan:  Asthma flare...Marland KitchenMarland Kitchen Prednisone 20 mg daily until clear, continue the antibiotic, at the cough syrup and home nebulizer 3 times a day, return in one week for follow-up

## 2014-06-24 NOTE — Progress Notes (Signed)
Pre visit review using our clinic review tool, if applicable. No additional management support is needed unless otherwise documented below in the visit note. 

## 2014-06-25 ENCOUNTER — Ambulatory Visit (INDEPENDENT_AMBULATORY_CARE_PROVIDER_SITE_OTHER): Payer: Medicare Other | Admitting: *Deleted

## 2014-06-25 DIAGNOSIS — Z5181 Encounter for therapeutic drug level monitoring: Secondary | ICD-10-CM | POA: Diagnosis not present

## 2014-06-25 DIAGNOSIS — Z7901 Long term (current) use of anticoagulants: Secondary | ICD-10-CM | POA: Diagnosis not present

## 2014-06-25 DIAGNOSIS — I480 Paroxysmal atrial fibrillation: Secondary | ICD-10-CM

## 2014-06-25 DIAGNOSIS — I4891 Unspecified atrial fibrillation: Secondary | ICD-10-CM

## 2014-06-25 LAB — POCT INR: INR: 3.3

## 2014-07-03 ENCOUNTER — Other Ambulatory Visit: Payer: Self-pay | Admitting: Cardiology

## 2014-07-03 ENCOUNTER — Ambulatory Visit (INDEPENDENT_AMBULATORY_CARE_PROVIDER_SITE_OTHER): Payer: Medicare Other | Admitting: *Deleted

## 2014-07-03 DIAGNOSIS — I4891 Unspecified atrial fibrillation: Secondary | ICD-10-CM

## 2014-07-03 DIAGNOSIS — Z5181 Encounter for therapeutic drug level monitoring: Secondary | ICD-10-CM

## 2014-07-03 DIAGNOSIS — I480 Paroxysmal atrial fibrillation: Secondary | ICD-10-CM | POA: Diagnosis not present

## 2014-07-03 DIAGNOSIS — L821 Other seborrheic keratosis: Secondary | ICD-10-CM | POA: Diagnosis not present

## 2014-07-03 DIAGNOSIS — Z7901 Long term (current) use of anticoagulants: Secondary | ICD-10-CM | POA: Diagnosis not present

## 2014-07-03 DIAGNOSIS — D1801 Hemangioma of skin and subcutaneous tissue: Secondary | ICD-10-CM | POA: Diagnosis not present

## 2014-07-03 DIAGNOSIS — L723 Sebaceous cyst: Secondary | ICD-10-CM | POA: Diagnosis not present

## 2014-07-03 LAB — POCT INR: INR: 1.7

## 2014-07-10 ENCOUNTER — Encounter (HOSPITAL_COMMUNITY): Payer: Self-pay

## 2014-07-10 ENCOUNTER — Ambulatory Visit (HOSPITAL_COMMUNITY)
Admission: RE | Admit: 2014-07-10 | Discharge: 2014-07-10 | Disposition: A | Discharge status: Home / Self Care | Payer: Medicare Other | Source: Ambulatory Visit | Attending: Cardiology | Admitting: Cardiology

## 2014-07-10 VITALS — BP 111/64 | HR 63 | Resp 18 | Wt 166.2 lb

## 2014-07-10 DIAGNOSIS — I519 Heart disease, unspecified: Secondary | ICD-10-CM

## 2014-07-10 DIAGNOSIS — K219 Gastro-esophageal reflux disease without esophagitis: Secondary | ICD-10-CM | POA: Diagnosis not present

## 2014-07-10 DIAGNOSIS — I2510 Atherosclerotic heart disease of native coronary artery without angina pectoris: Secondary | ICD-10-CM | POA: Diagnosis not present

## 2014-07-10 DIAGNOSIS — Z7901 Long term (current) use of anticoagulants: Secondary | ICD-10-CM | POA: Insufficient documentation

## 2014-07-10 DIAGNOSIS — R0600 Dyspnea, unspecified: Secondary | ICD-10-CM | POA: Diagnosis not present

## 2014-07-10 DIAGNOSIS — Z79899 Other long term (current) drug therapy: Secondary | ICD-10-CM | POA: Insufficient documentation

## 2014-07-10 DIAGNOSIS — R001 Bradycardia, unspecified: Secondary | ICD-10-CM

## 2014-07-10 DIAGNOSIS — J45909 Unspecified asthma, uncomplicated: Secondary | ICD-10-CM | POA: Insufficient documentation

## 2014-07-10 DIAGNOSIS — Z955 Presence of coronary angioplasty implant and graft: Secondary | ICD-10-CM | POA: Insufficient documentation

## 2014-07-10 DIAGNOSIS — I480 Paroxysmal atrial fibrillation: Secondary | ICD-10-CM | POA: Diagnosis not present

## 2014-07-10 DIAGNOSIS — I5189 Other ill-defined heart diseases: Secondary | ICD-10-CM

## 2014-07-10 DIAGNOSIS — I10 Essential (primary) hypertension: Secondary | ICD-10-CM | POA: Diagnosis not present

## 2014-07-10 DIAGNOSIS — E785 Hyperlipidemia, unspecified: Secondary | ICD-10-CM | POA: Insufficient documentation

## 2014-07-10 LAB — BASIC METABOLIC PANEL
Anion gap: 9 (ref 5–15)
BUN: 29 mg/dL — ABNORMAL HIGH (ref 6–20)
CHLORIDE: 107 mmol/L (ref 101–111)
CO2: 27 mmol/L (ref 22–32)
Calcium: 9 mg/dL (ref 8.9–10.3)
Creatinine, Ser: 1.27 mg/dL — ABNORMAL HIGH (ref 0.61–1.24)
GFR calc Af Amer: 59 mL/min — ABNORMAL LOW (ref >60)
GFR, EST NON AFRICAN AMERICAN: 50 mL/min — AB (ref >60)
Glucose, Bld: 134 mg/dL — ABNORMAL HIGH (ref 65–99)
POTASSIUM: 5.1 mmol/L (ref 3.5–5.1)
Sodium: 143 mmol/L (ref 135–145)

## 2014-07-10 LAB — CBC
HCT: 42.5 % (ref 39.0–52.0)
Hemoglobin: 13.5 g/dL (ref 13.0–17.0)
MCH: 29.6 pg (ref 26.0–34.0)
MCHC: 31.8 g/dL (ref 30.0–36.0)
MCV: 93.2 fL (ref 78.0–100.0)
PLATELETS: 232 10*3/uL (ref 150–400)
RBC: 4.56 MIL/uL (ref 4.22–5.81)
RDW: 14 % (ref 11.5–15.5)
WBC: 10.5 10*3/uL (ref 4.0–10.5)

## 2014-07-10 NOTE — Patient Instructions (Signed)
Labs today  Your physician has requested that you have an echocardiogram. Echocardiography is a painless test that uses sound waves to create images of your heart. It provides your doctor with information about the size and shape of your heart and how well your heart's chambers and valves are working. This procedure takes approximately one hour. There are no restrictions for this procedure.  Your physician recommends that you schedule a follow-up appointment in: 1 month

## 2014-07-10 NOTE — Progress Notes (Signed)
Patient ID: [name] Spence, MD, male   DOB: [birth year], 81 y.o.   MRN: [id number] PCP: Dr. 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  He had an RV infarct with chronic total occlusion of a small nondominant RCA in the 1980s.  He had a drug-eluting stent to the CFX in 11/10.    He is very symptomatic with atrial fibrillation episodes. He has had a chronic pattern of mild shortness of breath and mild left shoulder pain/chest aching when walking up a hill (x 20 years).   He can walk 3 miles on flat ground without problems.  He golfs 3-4 times a week.  Last echo in 6/12 showed EF 55-60% but there was an elevation in PA systolic pressure suggestive of diastolic dysfunction. Weight is stable.   He had an asthma exacerbation/acute bronchitis with wheezing in early 5/16.  He saw pulmonary and has been using Xopenex nebs. This has now resolved.  He has been having more atrial fibrillation episodes.  He had 2-3 episodes in February (had a steroid course at that time).  In May, he has had 3 more episodes in the setting of his asthma exacerbation.  He will feel tachypalpitations, dyspnea, and mild chest pressure with these episodes and HR will increase into the 120s.  He will take an extra atenolol, which will slow his HR down.  Last episode over the past weekend lasted longer than usual, > 12 hrs.  Today, he is in NSR.   ECG: NSR, normal  Labs (6/11): LDL 63, HDL 35, K 5.5, creatinine 1.1 Labs (6/12): LDL 58, HDL 53, TSH normal, K 4.7, creatinine 1 Labs (12/12): BNP 79 Labs (7/14): K 5, creatinine 1.1, LDL 48, HDL 34 Labs (8/15): LDL 48, HDL 33, TSH normal, K 5.4, creatinine 1.2  PMH: 1. CAD: RV infarct in the 1980s.  Chronic total occlusion of a small nondominant RCA.  11/10 PCI to CFX with Xience DES.  EF 60% on LV-gram at that time. ETT (11/14) with 9'07" exercise, no significant ST depression.  2.  Paroxysmal atrial  fibrillation 3.  Hyperlipidemia 4. GERD 5. HTN 6. Asthma 7. Suspected diastolic LV dysfunction: Echo (6/12): EF 55-60%, mild LV hypertrophy, no regional WMAs, mild AI, mild MR, mild-moderate biatrial enlargement, PA systolic pressure 51 mmHg.   SH: Nonsmoker.  Retired Stage manager, lives in [place].  2 daughters.  Originally from [place].   FH: Brother with CABG.   ROS: All systems reviewed and negative except as per HPI.    Current Outpatient Prescriptions  Medication Sig Dispense Refill  . albuterol (PROVENTIL) (2.5 MG/3ML) 0.083% nebulizer solution Take 3 mLs (2.5 mg total) by nebulization every 6 (six) hours as needed for wheezing or shortness of breath. 150 mL 1  . atenolol (TENORMIN) 25 MG tablet Take 1 tablet (25 mg total) by mouth daily. 100 tablet 4  . atorvastatin (LIPITOR) 10 MG tablet Take 2 tablets (20 mg total) by mouth daily. 100 tablet 4  . beclomethasone (QVAR) 80 MCG/ACT inhaler 1 puff twice a day 3 Inhaler 4  . buPROPion (WELLBUTRIN SR) 150 MG 12 hr tablet TAKE 1 TABLET BY MOUTH DAILY OR 1 DOSE 100 tablet 3  . cetirizine (ZYRTEC) 10 MG tablet Take 10 mg by mouth daily.    . Cholecalciferol (VITAMIN D3) 400 UNITS CAPS Take by mouth daily.      Marland Kitchen guaiFENesin (MUCINEX) 600 MG 12 hr tablet  Take 1,200 mg by mouth 2 (two) times daily.    Marland Kitchen ibuprofen (ADVIL,MOTRIN) 200 MG tablet Take 400 mg by mouth daily.    . mometasone (NASONEX) 50 MCG/ACT nasal spray Place 2 sprays into the nose daily. (Patient taking differently: Place 2 sprays into the nose as needed. ) 34 g 11  . montelukast (SINGULAIR) 10 MG tablet TAKE 1 TABLET BY MOUTH ONCE DAILY 90 tablet 4  . NON FORMULARY Pt on study drug     . Omega-3 Fatty Acids (FISH OIL) 1000 MG CAPS Take 1,000 mg by mouth daily.    Marland Kitchen omeprazole (PRILOSEC OTC) 20 MG tablet Take 1 tablet (20 mg total) by mouth daily. 100 tablet 4  . PROAIR HFA 108 (90 BASE) MCG/ACT inhaler INHALE 2 PUFFS INTO LUNGS EVERY 6 HOURS AS NEEDED FOR WHEEZING  8.5 g 0  . tamsulosin (FLOMAX) 0.4 MG CAPS capsule TAKE ONE CAPSULE BY MOUTH DAILY 100 capsule 4  . warfarin (COUMADIN) 5 MG tablet TAKE AS DIRECTED PER COUMADIN CLINIC 90 tablet 0  . amoxicillin (AMOXIL) 500 MG capsule Take 1 capsule (500 mg total) by mouth 3 (three) times daily. (Patient not taking: Reported on 07/10/2014) 21 capsule 1   No current facility-administered medications for this encounter.    BP 111/64 mmHg  Pulse 63  Resp 18  Wt 166 lb 4 oz (75.411 kg)  SpO2 98% General: NAD Neck: JVP 7-8 cm, no thyromegaly or thyroid nodule.  Lungs: Clear to auscultation bilaterally with normal respiratory effort. CV: Nondisplaced PMI.  Heart regular S1/S2, soft S4, no murmur.  No peripheral edema.  No carotid bruit.  Normal pedal pulses.  Abdomen: Soft, nontender, no hepatosplenomegaly, no distention.  Neurologic: Alert and oriented x 3.  Psych: Normal affect. Extremities: No clubbing or cyanosis.   Assessment/Plan: 1. 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  This has really not changed  He is still able to golf, walk on a treadmill, etc with no problem.  No ischemic changes on ECG.  ETT in 11/14 was a normal study.  - If he has any change in his exertional symptoms, he will let me know and I will arrange for ETT-Cardiolite. .  - He is on warfarin without ASA given stable CAD.   - Continue statin, beta blocker.  2. Hyperlipidemia: Lipids are checked through study.  Goal LDL < 70.    3. Diastolic dysfunction: Echo in 6/12 suggested diastolic dysfunction.  He is not significantly volume overloaded on exam.  4. Atrial fibrillation: Paroxysmal.  He has been having more episodes this year.  He had several episodes in 2/16 and several episodes again in 5/16.  Both clusters seem to have been associated with asthma exacerbation with use of prednisone.  He is quite symptomatic with atrial fibrillation.  - We discussed using a NOAC  rather than warfarin.  For now, he wants to stay with warfarin but will call if he changes his mind.  Check CBC with warfarin use.  - He is not a candidate to use a class Ic antiarrhythmic with a pill-in-pocket strategy given history of CAD.  I offered to let him start dronedarone 400 mg bid given apparent increase in atrial fibrillation episodes.  He wants to hold off for now and see how he does when he is not being aggressively treated for asthma.  If he goes for a long time without symptomatic atrial fibrillation, would be  reasonable to hold off on antiarrhythmic.  If episodes continue to come frequently, he will call me and we will use dronedarone.   - Given increased atrial fibrillation frequency, I will have him get an echocardiogram.   Loralie Champagne 07/10/2014

## 2014-07-10 NOTE — Progress Notes (Signed)
Advanced Heart Failure Medication Review by a Pharmacist  Does the patient  feel that his/her medications are working for him/her?  yes  Has the patient been experiencing any side effects to the medications prescribed?  no  Does the patient measure his/her own blood pressure or blood glucose at home?  no   Does the patient have any problems obtaining medications due to transportation or finances?   no  Understanding of regimen: excellent Understanding of indications: excellent Potential of compliance: excellent   Pharmacist comments: Pt presents to heart failure clinic and medications were reviewed with a pharmacist. He reports that he can feel when he is in afib and when this happens, he will double up on his atenolol dose. He also states that he has been using his Qvar inhaler prn rather than scheduled BID. Discussed the importance of his maintenance inhaler in preventing SOB and using his albuterol as needed to relieve SOB. Also counseled on importance of rinsing his mouth out after use. Pt questioned whether or not he still needed to take his PPI. States that he has been taking this for years for GERD. Advised him to continue at this time since it's also providing GI protection since he takes warfarin, daily ibuprofen, and is on a course of steroids.  Nylani Michetti E. Sydney Azure, Pharm.D Clinical Pharmacy Resident Pager: [phone] 07/10/2014 9:56 AM

## 2014-07-12 ENCOUNTER — Ambulatory Visit (INDEPENDENT_AMBULATORY_CARE_PROVIDER_SITE_OTHER): Payer: Medicare Other

## 2014-07-12 DIAGNOSIS — I480 Paroxysmal atrial fibrillation: Secondary | ICD-10-CM | POA: Diagnosis not present

## 2014-07-12 DIAGNOSIS — Z7901 Long term (current) use of anticoagulants: Secondary | ICD-10-CM | POA: Diagnosis not present

## 2014-07-12 DIAGNOSIS — I4891 Unspecified atrial fibrillation: Secondary | ICD-10-CM | POA: Diagnosis not present

## 2014-07-12 DIAGNOSIS — Z5181 Encounter for therapeutic drug level monitoring: Secondary | ICD-10-CM | POA: Diagnosis not present

## 2014-07-12 LAB — POCT INR: INR: 2.6

## 2014-07-17 ENCOUNTER — Telehealth (HOSPITAL_COMMUNITY): Payer: Self-pay | Admitting: Vascular Surgery

## 2014-07-17 NOTE — Telephone Encounter (Signed)
LEFT MESSAGE TO CHANGE PT APPT from 8/9 to 9 ECHO 10:20 appt w/ Mclean

## 2014-07-29 ENCOUNTER — Ambulatory Visit (INDEPENDENT_AMBULATORY_CARE_PROVIDER_SITE_OTHER): Payer: Medicare Other | Admitting: *Deleted

## 2014-07-29 DIAGNOSIS — Z5181 Encounter for therapeutic drug level monitoring: Secondary | ICD-10-CM | POA: Diagnosis not present

## 2014-07-29 DIAGNOSIS — I480 Paroxysmal atrial fibrillation: Secondary | ICD-10-CM | POA: Diagnosis not present

## 2014-07-29 DIAGNOSIS — Z7901 Long term (current) use of anticoagulants: Secondary | ICD-10-CM | POA: Diagnosis not present

## 2014-07-29 DIAGNOSIS — I4891 Unspecified atrial fibrillation: Secondary | ICD-10-CM

## 2014-07-29 LAB — POCT INR: INR: 2.7

## 2014-08-14 ENCOUNTER — Other Ambulatory Visit: Payer: Self-pay

## 2014-08-14 ENCOUNTER — Ambulatory Visit (HOSPITAL_COMMUNITY)
Admission: RE | Admit: 2014-08-14 | Discharge: 2014-08-14 | Disposition: A | Discharge status: Home / Self Care | Payer: Medicare Other | Source: Ambulatory Visit | Attending: Cardiology | Admitting: Cardiology

## 2014-08-14 ENCOUNTER — Ambulatory Visit (HOSPITAL_BASED_OUTPATIENT_CLINIC_OR_DEPARTMENT_OTHER)
Admission: RE | Admit: 2014-08-14 | Discharge: 2014-08-14 | Disposition: A | Discharge status: Home / Self Care | Payer: Medicare Other | Source: Ambulatory Visit | Attending: Cardiology | Admitting: Cardiology

## 2014-08-14 VITALS — BP 160/72 | HR 62 | Wt 166.8 lb

## 2014-08-14 DIAGNOSIS — Z79899 Other long term (current) drug therapy: Secondary | ICD-10-CM | POA: Diagnosis not present

## 2014-08-14 DIAGNOSIS — K219 Gastro-esophageal reflux disease without esophagitis: Secondary | ICD-10-CM | POA: Diagnosis not present

## 2014-08-14 DIAGNOSIS — E785 Hyperlipidemia, unspecified: Secondary | ICD-10-CM | POA: Diagnosis not present

## 2014-08-14 DIAGNOSIS — I2510 Atherosclerotic heart disease of native coronary artery without angina pectoris: Secondary | ICD-10-CM | POA: Insufficient documentation

## 2014-08-14 DIAGNOSIS — I10 Essential (primary) hypertension: Secondary | ICD-10-CM

## 2014-08-14 DIAGNOSIS — I519 Heart disease, unspecified: Secondary | ICD-10-CM

## 2014-08-14 DIAGNOSIS — I5189 Other ill-defined heart diseases: Secondary | ICD-10-CM

## 2014-08-14 DIAGNOSIS — I480 Paroxysmal atrial fibrillation: Secondary | ICD-10-CM | POA: Insufficient documentation

## 2014-08-14 DIAGNOSIS — J45909 Unspecified asthma, uncomplicated: Secondary | ICD-10-CM | POA: Insufficient documentation

## 2014-08-14 DIAGNOSIS — Z7901 Long term (current) use of anticoagulants: Secondary | ICD-10-CM | POA: Diagnosis not present

## 2014-08-14 DIAGNOSIS — I5032 Chronic diastolic (congestive) heart failure: Secondary | ICD-10-CM | POA: Insufficient documentation

## 2014-08-14 MED ORDER — FUROSEMIDE 20 MG PO TABS
20.0000 mg | ORAL_TABLET | Freq: Every day | ORAL | Status: DC
Start: 2014-08-14 — End: 2014-12-02

## 2014-08-14 NOTE — Progress Notes (Signed)
  Echocardiogram 2D Echocardiogram has been performed.  [name] [name] 08/14/2014, 9:56 AM

## 2014-08-14 NOTE — Patient Instructions (Signed)
START Lasix 20 mg, one tab daily  Labs needed in two weeks (BMET/BNP)  Your physician has requested that you regularly monitor and record your blood pressure readings at home. Please use the same machine at the same time of day to check your readings and record. Please send Dr.McLean a MyChart message in 2 weeks with the readings.  Your physician recommends that you schedule a follow-up appointment in: 3 months  Do the following things EVERYDAY: 1) Weigh yourself in the morning before breakfast. Write it down and keep it in a log. 2) Take your medicines as prescribed 3) Eat low salt foods-Limit salt (sodium) to 2000 mg per day.  4) Stay as active as you can everyday 5) Limit all fluids for the day to less than 2 liters 6)

## 2014-08-14 NOTE — Progress Notes (Signed)
Patient ID: [name] Spence, MD, male   DOB: [birth year], 81 y.o.   MRN: [id number] PCP: Dr. 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  He had an RV infarct with chronic total occlusion of a small nondominant RCA in the 1980s.  He had a drug-eluting stent to the CFX in 11/10.    He is very symptomatic with atrial fibrillation episodes. He has had a chronic pattern of mild shortness of breath and mild left shoulder pain/chest aching when walking up a hill (x 20 years).   He can walk 3 miles on flat ground without problems.  He golfs 3-4 times a week.  He walks for 20-30 minutes on a treadmill several times a week.   He had an asthma exacerbation/acute bronchitis with wheezing in early 5/16.  He saw pulmonary and was started on prednisone with resolution of wheezing.  At the time of the asthma exacerbation, he began having more atrial fibrillation episodes.  In May, he had 3 episodes in the setting of his asthma exacerbation.  He will feel tachypalpitations, dyspnea, and mild chest pressure with these episodes and HR will increase into the 120s.  He will take an extra atenolol, which will slow his HR down. Symptoms seemed to improve after he came off prednisone.  Since last visit with me, he had one episode of atrial fibrillation several weeks ago.  This lasted a few hours and resolved.  He was not tachycardic. He is in a probable ectopic atrial rhythm with HR 60s today.  His BP is high but he says that he was nervous about the echo today.  BP has not been elevated when he checks at home (SBP 120s).    I reviewed today's echo.  Normal LV systolic function, EF 83-09% with moderate diastolic dysfunction.  Moderate TR with PA systolic pressure 65 mmHg.  Normal RV size and systolic function.  ECG: suspect ectopic atrial rhythm at 62 bpm, otherwise normal  Labs (6/11): LDL 63, HDL 35, K 5.5, creatinine 1.1 Labs (6/12): LDL 58,  HDL 53, TSH normal, K 4.7, creatinine 1 Labs (12/12): BNP 79 Labs (7/14): K 5, creatinine 1.1, LDL 48, HDL 34 Labs (8/15): LDL 48, HDL 33, TSH normal, K 5.4, creatinine 1.2 Labs (6/16): K 5.1, creatinine 1.27, HCT 42.5  PMH: 1. CAD: RV infarct in the 1980s.  Chronic total occlusion of a small nondominant RCA.  11/10 PCI to CFX with Xience DES.  EF 60% on LV-gram at that time. ETT (11/14) with 9'07" exercise, no significant ST depression.  2.  Paroxysmal atrial fibrillation 3.  Hyperlipidemia 4. GERD 5. HTN 6. Asthma 7. Chronic diastolic CHF: Echo (4/07): EF 55-60%, mild LV hypertrophy, no regional WMAs, mild AI, mild MR, mild-moderate biatrial enlargement, PA systolic pressure 51 mmHg. Echo (7/16) with EF 60-65%, moderate diastolic dysfunction, normal RV size and systolic function, mild-moderate MR, moderate TR, moderate biatrial enlargement, PA systolic pressure 65 mmHg.   SH: Nonsmoker.  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   FH: Brother with CABG.   ROS: All systems reviewed and negative except as per HPI.    Current Outpatient Prescriptions  Medication Sig Dispense Refill  . albuterol (PROVENTIL) (2.5 MG/3ML) 0.083% nebulizer solution Take 3 mLs (2.5 mg total) by nebulization every 6 (six) hours as needed for wheezing or shortness of breath. 150 mL 1  . atenolol (TENORMIN) 25  MG tablet Take 1 tablet (25 mg total) by mouth daily. 100 tablet 4  . atorvastatin (LIPITOR) 10 MG tablet Take 2 tablets (20 mg total) by mouth daily. 100 tablet 4  . beclomethasone (QVAR) 80 MCG/ACT inhaler 1 puff twice a day 3 Inhaler 4  . buPROPion (WELLBUTRIN SR) 150 MG 12 hr tablet TAKE 1 TABLET BY MOUTH DAILY OR 1 DOSE 100 tablet 3  . cetirizine (ZYRTEC) 10 MG tablet Take 10 mg by mouth daily.    . Cholecalciferol (VITAMIN D3) 400 UNITS CAPS Take by mouth daily.      Marland Kitchen ibuprofen (ADVIL,MOTRIN) 200 MG tablet Take 400 mg by mouth daily.    . mometasone  (NASONEX) 50 MCG/ACT nasal spray Place 2 sprays into the nose daily. (Patient taking differently: Place 2 sprays into the nose as needed. ) 34 g 11  . montelukast (SINGULAIR) 10 MG tablet TAKE 1 TABLET BY MOUTH ONCE DAILY 90 tablet 4  . NON FORMULARY Pt on study drug     . Omega-3 Fatty Acids (FISH OIL) 1000 MG CAPS Take 1,000 mg by mouth daily.    Marland Kitchen omeprazole (PRILOSEC OTC) 20 MG tablet Take 1 tablet (20 mg total) by mouth daily. 100 tablet 4  . PROAIR HFA 108 (90 BASE) MCG/ACT inhaler INHALE 2 PUFFS INTO LUNGS EVERY 6 HOURS AS NEEDED FOR WHEEZING 8.5 g 0  . tamsulosin (FLOMAX) 0.4 MG CAPS capsule TAKE ONE CAPSULE BY MOUTH DAILY 100 capsule 4  . warfarin (COUMADIN) 5 MG tablet TAKE AS DIRECTED PER COUMADIN CLINIC 90 tablet 0  . furosemide (LASIX) 20 MG tablet Take 1 tablet (20 mg total) by mouth daily. 30 tablet 6   No current facility-administered medications for this encounter.    BP 160/72 mmHg  Pulse 62  Wt 166 lb 12 oz (75.637 kg)  SpO2 97% General: NAD Neck: JVP 8 cm, no thyromegaly or thyroid nodule.  Lungs: Clear to auscultation bilaterally with normal respiratory effort. CV: Nondisplaced PMI.  Heart regular S1/S2, soft S4, no murmur.  Trace ankle edema.  No carotid bruit.  Normal pedal pulses.  Abdomen: Soft, nontender, no hepatosplenomegaly, no distention.  Neurologic: Alert and oriented x 3.  Psych: Normal affect. Extremities: No clubbing or cyanosis.   Assessment/Plan: 1. 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  This has really not changed  He is still able to golf, walk on a treadmill, etc with no problem.  No ischemic changes on ECG.  ETT in 11/14 was a normal study.  - If he has any change in his exertional symptoms, he will let me know and I will arrange for ETT-Cardiolite. .  - He is on warfarin without ASA given stable CAD.   - Continue statin, beta blocker.  2. Hyperlipidemia: Lipids are checked  through study.  Goal LDL < 70.    3. Chronic diastolic CHF: Echo suggests progressive diastolic dysfunction.  EF 60-65% with grade II diastolic dysfunction and PA systolic pressure up to 65 mmHg.  RV appears normal.  On exam, I think that he has mild volume overload.  He has chronic dyspnea walking up hills/inclines.  - I am going to start patient on Lasix 20 mg daily with BMET/BNP in 2 wks. No KCl supplementation, he chronically has borderline elevated K.  4. Atrial fibrillation: Paroxysmal.  He had several episodes in 2/16 and several episodes again in 5/16.  Both clusters seem to  have been associated with asthma exacerbation with use of prednisone.  He is quite symptomatic with atrial fibrillation.  More recently, he has had only 1 episode atrial fibrillation several weeks ago that was short-lived.  - We have discussed using a NOAC rather than warfarin.  For now, he wants to stay with warfarin but will let me know if he changes his mind.  - He is not a candidate to use a class Ic antiarrhythmic with a pill-in-pocket strategy given history of CAD.  We have discussed antiarrhythmics that he could potentially use.  Given only mild CHF, I think that dronedarone would still be an option.  Beyond this, his best options would be Tikosyn or amiodarone.  If he has another cluster of atrial fibrillation episodes, I will start him on dronedarone 400 mg bid.   - Volume control with Lasix and BP control can help to decrease atrial fibrillation recurrence.  He does not screen positive for OSA and does not want sleep study.  5. HTN: BP is high today.  He was anxious today about his echo.  SBP in 120s when he checks at home.  I am going to have him check his BP daily x 2 weeks then send me the readings by Mychart or by calling the nurse.  If SBP is consistently high, he will need another medication for BP, will likely use amlodipine rather than ACEI given some elevation in his creatinine.   Loralie Champagne 08/14/2014

## 2014-08-26 ENCOUNTER — Ambulatory Visit (INDEPENDENT_AMBULATORY_CARE_PROVIDER_SITE_OTHER): Payer: Medicare Other

## 2014-08-26 DIAGNOSIS — I480 Paroxysmal atrial fibrillation: Secondary | ICD-10-CM

## 2014-08-26 DIAGNOSIS — Z7901 Long term (current) use of anticoagulants: Secondary | ICD-10-CM

## 2014-08-26 DIAGNOSIS — I4891 Unspecified atrial fibrillation: Secondary | ICD-10-CM

## 2014-08-26 DIAGNOSIS — Z5181 Encounter for therapeutic drug level monitoring: Secondary | ICD-10-CM | POA: Diagnosis not present

## 2014-08-26 LAB — POCT INR: INR: 2.5

## 2014-09-04 ENCOUNTER — Telehealth (HOSPITAL_COMMUNITY): Payer: Self-pay | Admitting: *Deleted

## 2014-09-04 NOTE — Telephone Encounter (Signed)
BP checks at home per pt.   7/6  140/70 7/7  141/62 7/8  140/71 7/9 140/68 7/10 133/67 7/11  135/63 7/12 122/62 7/13 101/55 7/14  131/71 7/16  128/60 7/17 125/66 7/18 133/69 7/21 127/77 7/22 134/69  Please review. Thanks  Philemon Kingdom D

## 2014-09-04 NOTE — Telephone Encounter (Signed)
These look ok, no changes.

## 2014-09-04 NOTE — Telephone Encounter (Signed)
Patient aware of no changes

## 2014-09-23 ENCOUNTER — Encounter: Payer: Self-pay | Admitting: Internal Medicine

## 2014-09-23 ENCOUNTER — Ambulatory Visit (INDEPENDENT_AMBULATORY_CARE_PROVIDER_SITE_OTHER): Payer: Medicare Other | Admitting: Internal Medicine

## 2014-09-23 VITALS — BP 118/70 | HR 65 | Ht 70.0 in | Wt 166.2 lb

## 2014-09-23 DIAGNOSIS — J4520 Mild intermittent asthma, uncomplicated: Secondary | ICD-10-CM

## 2014-09-23 DIAGNOSIS — I272 Pulmonary hypertension, unspecified: Secondary | ICD-10-CM

## 2014-09-23 DIAGNOSIS — I270 Primary pulmonary hypertension: Secondary | ICD-10-CM

## 2014-09-23 NOTE — Progress Notes (Signed)
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 Followed in past for asthma w/ bronchitis. LOV around 2008. Medical hx CAD/ MI/CABG, AFib/ warfarin, diastolic dysfunction, GERD He did well for several years with help from Dr Sherren Mocha. Starting about 6 months ago with no obvious trigger, he began having increased wheeze, cough, needing SABA 2-3x/ day and Qvar only intermittently. He has been worse this Spring, blaming pollen. Sputum remains clear. Denies fever, nodes, blood, edema, chest pain. Hx  Intermittent AFib- no pacemaker Had BCG in medical school.         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 Followed in past for asthma w/ bronchitis.  Medical hx CAD/ MI/CABG,P AFib/ warfarin, diastolic dysfunction, GERD. Dr Coralie Keens alerted [place] that pt was having more trouble, so we could work him in today. Follows For: Pt c/o wheezing and SOB, prod cough with thick yellow mucus. States he had asthma flare up last night. Has has had 3 episodes of A-fib in last week, most recent was 06/18/14.  Persistent variable chest rattle has been going on almost since he was last seen here. Using albuterol rescue inhaler once or twice daily. He has caught a cold from his wife and overnight began or persistent wheeze, using rescue inhaler 4 times daily and continuing his Qvar. Transient relief only. Sputum trace yellow. No fever. He has a little left over prednisone but reports that 20 mg of prednisone daily is enough of a stimulant to trigger his atrial fib.  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  Medical hx CAD/ MI/CABG,P AFib/ warfarin, diastolic dysfunction, GERD Follow For: Pt doing well since last visit. Still post nasal drip. Denies wheezing, SOB, Prod cough.  Has not needed rescue  inhaler in months and denies wheeze since a cold in early spring. Using Qvar twice daily and Singulair. Notices some hoarseness, mouth breathing  Told he has some pulmonary hypertension. We discussed possible role of oxygen desaturation. Does not recognize symptoms of sleep apnea. CXR 06/24/14 IMPRESSION: No active cardiopulmonary disease. Electronically Signed  By: Franchot Gallo M.D.  On: 06/24/2014 10:39  ROS-see HPI Constitutional:   No-   weight loss, night sweats, fevers, chills, fatigue, lassitude. HEENT:   No-  headaches, difficulty swallowing, tooth/dental problems, sore throat,       No-  sneezing, itching, ear ache, +nasal congestion, post nasal drip,  CV:  No-   chest pain, orthopnea, PND, swelling in lower extremities, anasarca,                                                      dizziness, palpitations Resp: No-   shortness of breath with exertion or at rest.             productive cough,  + non-productive cough,  No- coughing up of blood.              No-   change in color of mucus.  +wheezing.   Skin: No-   rash or lesions. GI:  No-   heartburn, indigestion, abdominal pain, nausea, vomiting,  GU:  pain. MS:  No-   joint pain or swelling.   Neuro-     nothing  unusual Psych:  No- change in mood or affect. No depression or anxiety.  No memory loss.  OBJ- Physical Exam General- Alert, Oriented, Affect-appropriate, Distress- none acute Skin- rash-none, lesions- none, excoriation- none Lymphadenopathy- none Head- atraumatic            Eyes- Gross vision intact, PERRLA, conjunctivae and secretions clear            Ears- Hearing aid +            Nose- + stuffy, no-Septal dev, mucus, polyps, erosion, perforation             Throat- Mallampati II-III , mucosa clear-not red , drainage- none, tonsils- atrophic. +Hoarse Neck- flexible , trachea midline, no stridor , thyroid nl, carotid no bruit Chest - symmetrical excursion , unlabored           Heart/CV- RRR , no murmur ,  no gallop  , no rub, nl s1 s2                           - JVD- none , edema- none, stasis changes- none, varices- none           Lung-  wheeze -none, cough-none , dullness-none, rub- none           Chest wall-  Abd-  Br/ Gen/ Rectal- Not done, not indicated Extrem- cyanosis- none, clubbing, none, atrophy- none, strength- nl Neuro- grossly intact to observation

## 2014-09-23 NOTE — Patient Instructions (Addendum)
Order- [place] room air    Dx pulmonary hypertension   Ok to drop off Singulair and watch to see how you do

## 2014-09-26 DIAGNOSIS — I272 Pulmonary hypertension, unspecified: Secondary | ICD-10-CM | POA: Insufficient documentation

## 2014-09-26 NOTE — Assessment & Plan Note (Signed)
No wheeze and control has been very good through the summer on Qvar with Singulair. Plan-okay to drop Singulair and see how he does. We discussed what to expect with upcoming season change.

## 2014-09-26 NOTE — Assessment & Plan Note (Signed)
I don't think he is likely to have significant obstructive sleep apnea. We did discuss the possibility of sleep associated oxygen desaturation as a possible aggravating effect on pulmonary blood flow. Plan-overnight oximetry on room air

## 2014-09-30 DIAGNOSIS — R0602 Shortness of breath: Secondary | ICD-10-CM | POA: Diagnosis not present

## 2014-10-04 ENCOUNTER — Telehealth: Payer: Self-pay | Admitting: Internal Medicine

## 2014-10-04 NOTE — Telephone Encounter (Signed)
Per CY  Does not need sleep O2.  Called and spoke with pt. Reviewed results and recs. Pt voiced understanding and had no further questions.  Nothing further needed

## 2014-10-05 ENCOUNTER — Other Ambulatory Visit: Payer: Self-pay | Admitting: Cardiology

## 2014-10-07 ENCOUNTER — Ambulatory Visit (INDEPENDENT_AMBULATORY_CARE_PROVIDER_SITE_OTHER): Payer: Medicare Other | Admitting: *Deleted

## 2014-10-07 DIAGNOSIS — I4891 Unspecified atrial fibrillation: Secondary | ICD-10-CM

## 2014-10-07 DIAGNOSIS — Z5181 Encounter for therapeutic drug level monitoring: Secondary | ICD-10-CM

## 2014-10-07 DIAGNOSIS — I480 Paroxysmal atrial fibrillation: Secondary | ICD-10-CM | POA: Diagnosis not present

## 2014-10-07 DIAGNOSIS — Z7901 Long term (current) use of anticoagulants: Secondary | ICD-10-CM

## 2014-10-07 LAB — POCT INR: INR: 1.9

## 2014-10-16 ENCOUNTER — Other Ambulatory Visit: Payer: Self-pay | Admitting: Family Medicine

## 2014-10-28 DIAGNOSIS — Z23 Encounter for immunization: Secondary | ICD-10-CM | POA: Diagnosis not present

## 2014-11-11 ENCOUNTER — Encounter: Payer: Self-pay | Admitting: Internal Medicine

## 2014-11-18 ENCOUNTER — Ambulatory Visit (INDEPENDENT_AMBULATORY_CARE_PROVIDER_SITE_OTHER): Payer: Medicare Other | Admitting: Pharmacist

## 2014-11-18 ENCOUNTER — Encounter: Payer: Medicare Other | Admitting: Family Medicine

## 2014-11-18 DIAGNOSIS — I4891 Unspecified atrial fibrillation: Secondary | ICD-10-CM

## 2014-11-18 DIAGNOSIS — Z5181 Encounter for therapeutic drug level monitoring: Secondary | ICD-10-CM | POA: Diagnosis not present

## 2014-11-18 DIAGNOSIS — I480 Paroxysmal atrial fibrillation: Secondary | ICD-10-CM

## 2014-11-18 DIAGNOSIS — Z7901 Long term (current) use of anticoagulants: Secondary | ICD-10-CM

## 2014-11-18 LAB — POCT INR: INR: 2.2

## 2014-12-02 ENCOUNTER — Ambulatory Visit (INDEPENDENT_AMBULATORY_CARE_PROVIDER_SITE_OTHER): Payer: Medicare Other | Admitting: Family Medicine

## 2014-12-02 ENCOUNTER — Encounter: Payer: Self-pay | Admitting: Family Medicine

## 2014-12-02 VITALS — BP 140/80 | Temp 97.4°F | Ht 70.5 in | Wt 166.0 lb

## 2014-12-02 DIAGNOSIS — N401 Enlarged prostate with lower urinary tract symptoms: Secondary | ICD-10-CM | POA: Diagnosis not present

## 2014-12-02 DIAGNOSIS — I272 Other secondary pulmonary hypertension: Secondary | ICD-10-CM

## 2014-12-02 DIAGNOSIS — Z0000 Encounter for general adult medical examination without abnormal findings: Secondary | ICD-10-CM

## 2014-12-02 DIAGNOSIS — I10 Essential (primary) hypertension: Secondary | ICD-10-CM | POA: Diagnosis not present

## 2014-12-02 DIAGNOSIS — R351 Nocturia: Secondary | ICD-10-CM

## 2014-12-02 DIAGNOSIS — J4530 Mild persistent asthma, uncomplicated: Secondary | ICD-10-CM

## 2014-12-02 DIAGNOSIS — I519 Heart disease, unspecified: Secondary | ICD-10-CM

## 2014-12-02 DIAGNOSIS — I5189 Other ill-defined heart diseases: Secondary | ICD-10-CM

## 2014-12-02 DIAGNOSIS — J45909 Unspecified asthma, uncomplicated: Secondary | ICD-10-CM

## 2014-12-02 DIAGNOSIS — F411 Generalized anxiety disorder: Secondary | ICD-10-CM | POA: Diagnosis not present

## 2014-12-02 DIAGNOSIS — E785 Hyperlipidemia, unspecified: Secondary | ICD-10-CM | POA: Diagnosis not present

## 2014-12-02 DIAGNOSIS — K219 Gastro-esophageal reflux disease without esophagitis: Secondary | ICD-10-CM

## 2014-12-02 DIAGNOSIS — I480 Paroxysmal atrial fibrillation: Secondary | ICD-10-CM

## 2014-12-02 DIAGNOSIS — J302 Other seasonal allergic rhinitis: Secondary | ICD-10-CM

## 2014-12-02 LAB — CBC WITH DIFFERENTIAL/PLATELET
BASOS ABS: 0 10*3/uL (ref 0.0–0.1)
BASOS PCT: 0.3 % (ref 0.0–3.0)
Eosinophils Absolute: 0.4 10*3/uL (ref 0.0–0.7)
Eosinophils Relative: 4.2 % (ref 0.0–5.0)
HCT: 43.2 % (ref 39.0–52.0)
Hemoglobin: 14.2 g/dL (ref 13.0–17.0)
LYMPHS ABS: 1.1 10*3/uL (ref 0.7–4.0)
Lymphocytes Relative: 12.1 % (ref 12.0–46.0)
MCHC: 32.8 g/dL (ref 30.0–36.0)
MCV: 91.6 fl (ref 78.0–100.0)
Monocytes Absolute: 0.9 10*3/uL (ref 0.1–1.0)
Monocytes Relative: 9.4 % (ref 3.0–12.0)
NEUTROS PCT: 74 % (ref 43.0–77.0)
Neutro Abs: 6.7 10*3/uL (ref 1.4–7.7)
Platelets: 206 10*3/uL (ref 150.0–400.0)
RBC: 4.72 Mil/uL (ref 4.22–5.81)
RDW: 13.8 % (ref 11.5–15.5)
WBC: 9.1 10*3/uL (ref 4.0–10.5)

## 2014-12-02 LAB — HEPATIC FUNCTION PANEL
ALBUMIN: 4 g/dL (ref 3.5–5.2)
ALK PHOS: 77 U/L (ref 39–117)
ALT: 16 U/L (ref 0–53)
AST: 15 U/L (ref 0–37)
Bilirubin, Direct: 0.2 mg/dL (ref 0.0–0.3)
TOTAL PROTEIN: 6.6 g/dL (ref 6.0–8.3)
Total Bilirubin: 0.8 mg/dL (ref 0.2–1.2)

## 2014-12-02 LAB — POCT URINALYSIS DIPSTICK
BILIRUBIN UA: NEGATIVE
Glucose, UA: NEGATIVE
KETONES UA: NEGATIVE
Leukocytes, UA: NEGATIVE
NITRITE UA: NEGATIVE
Protein, UA: NEGATIVE
Urobilinogen, UA: 0.2
pH, UA: 5.5

## 2014-12-02 LAB — PSA: PSA: 6.18 ng/mL — ABNORMAL HIGH (ref 0.10–4.00)

## 2014-12-02 LAB — LIPID PANEL
Cholesterol: 86 mg/dL (ref 0–200)
HDL: 32.5 mg/dL — AB (ref >39.00)
LDL Cholesterol: 37 mg/dL (ref 0–99)
NonHDL: 53.06
TRIGLYCERIDES: 82 mg/dL (ref 0.0–149.0)
Total CHOL/HDL Ratio: 3
VLDL: 16.4 mg/dL (ref 0.0–40.0)

## 2014-12-02 LAB — MICROALBUMIN / CREATININE URINE RATIO
Creatinine,U: 29.3 mg/dL
Microalb Creat Ratio: 2.4 mg/g (ref 0.0–30.0)
Microalb, Ur: <0.7 mg/dL (ref 0.0–1.9)

## 2014-12-02 LAB — TSH: TSH: 2.97 u[IU]/mL (ref 0.35–4.50)

## 2014-12-02 MED ORDER — MONTELUKAST SODIUM 10 MG PO TABS: ORAL_TABLET | ORAL | Status: DC

## 2014-12-02 MED ORDER — BUPROPION HCL ER (SR) 150 MG PO TB12
150.0000 mg | ORAL_TABLET | Freq: Every day | ORAL | Status: DC
Start: 2014-12-02 — End: 2016-01-07

## 2014-12-02 MED ORDER — FLUTICASONE PROPIONATE 50 MCG/ACT NA SUSP: 2.0000 | Freq: Every day | NASAL | Status: DC

## 2014-12-02 MED ORDER — PREDNISONE 20 MG PO TABS: ORAL_TABLET | ORAL | Status: DC

## 2014-12-02 MED ORDER — ATENOLOL 25 MG PO TABS: 25.0000 mg | ORAL_TABLET | Freq: Every day | ORAL | Status: DC

## 2014-12-02 MED ORDER — ATORVASTATIN CALCIUM 10 MG PO TABS: ORAL_TABLET | ORAL | Status: DC

## 2014-12-02 MED ORDER — BECLOMETHASONE DIPROPIONATE 80 MCG/ACT IN AERS: INHALATION_SPRAY | RESPIRATORY_TRACT | Status: DC

## 2014-12-02 MED ORDER — TAMSULOSIN HCL 0.4 MG PO CAPS: ORAL_CAPSULE | ORAL | Status: DC

## 2014-12-02 MED ORDER — FUROSEMIDE 20 MG PO TABS: 20.0000 mg | ORAL_TABLET | Freq: Every day | ORAL | Status: DC

## 2014-12-02 NOTE — Progress Notes (Signed)
Subjective:    Patient ID: [name], MD, male    DOB: 08-27-1929, 81 y.o.   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  He says overall he feels well except T7 1 spell of A. fib about once a month. Usually lasts about 12-14 hours. He takes an extra dose of his Tenormin and it typically abates. He's discuss with cardiology other therapies but this juncture he's content to stay which is the extra beta blocker on a when necessary basis.  He also has a history of asthma. He uses Qvar 80 1 puff twice a day and albuterol nebulizer when necessary. He's also followed by Dr. Annamaria Boots for this problem  He takes Wellbutrin 150 mg daily because of a history of mild anxiety and depression  He takes Lipitor 20 mg daily  He also uses Flonase for allergic rhinitis Lasix to help peripheral edema and hypertension steroid nasal spray and Prilosec when necessary  He's on Flomax 0.4 as noted above. He's on Coumadin and is followed at the Coumadin clinic  He gets routine eye care, dental care, last colonoscopy was greater than 10 years ago is advised he needs no further colonoscopies. Bowel habits normal no complaints of constipation change in bowel habits rectal bleeding etc.  EKG done 08/14/2014 by cardiology was normal therefore not repeated  Cognitive function normal he walks daily plays golf most daily, home health safety reviewed no issues identified, no guns in the house, he does have a healthcare power of attorney and living well   Review of Systems  Constitutional: Negative.   HENT: Negative.   Eyes: Negative.   Respiratory: Negative.   Cardiovascular: Negative.   Gastrointestinal: Negative.   Endocrine: Negative.   Genitourinary: Negative.     Musculoskeletal: Negative.   Skin: Negative.   Allergic/Immunologic: Negative.   Neurological: Negative.   Hematological: Negative.   Psychiatric/Behavioral: Negative.        Objective:   Physical Exam  Constitutional: He is oriented to person, place, and time. He appears well-developed and well-nourished.  HENT:  Head: Normocephalic and atraumatic.  Right Ear: External ear normal.  Left Ear: External ear normal.  Nose: Nose normal.  Mouth/Throat: Oropharynx is clear and moist.  Eyes: Conjunctivae and EOM are normal. Pupils are equal, round, and reactive to light.  Neck: Normal range of motion. Neck supple. No JVD present. No tracheal deviation present. No thyromegaly present.  Cardiovascular: Normal rate, regular rhythm, normal heart sounds and intact distal pulses.  Exam reveals no gallop and no friction rub.   No murmur heard. No carotid nor aortic bruits peripheral pulses 1+ and symmetrical  Pulmonary/Chest: Effort normal and breath sounds normal. No stridor. No respiratory distress. He has no wheezes. He has no rales. He exhibits no tenderness.  Abdominal: Soft. Bowel sounds are normal. He exhibits no distension and no mass. There is no tenderness. There is no rebound and no guarding.  Genitourinary: Rectum normal and penis normal. Guaiac negative stool. No penile tenderness.  2+ symmetrical nonnodular BPH  Musculoskeletal: Normal range of motion. He exhibits no edema or tenderness.  Lymphadenopathy:    He has no cervical adenopathy.  Neurological: He is alert and oriented to person, place, and time. He has normal reflexes. No cranial nerve deficit. He exhibits normal muscle tone.  Skin: Skin is warm and dry. No rash noted. No erythema. No pallor.  Total body skin exam normal except for scar mid abdomen from previous abdominal surgery  Psychiatric: He has a normal mood and affect. His behavior is normal. Judgment and thought content normal.  Nursing note and vitals  reviewed.         Assessment & Plan:  Healthy male  A. Fib......... Intermittent..... Continue excellent dose of beta blocker when necessary  .Marland Kitchen Hyperlipidemia....... continue current medications  Asthma.......... clinically stable...Marland KitchenMarland KitchenMarland Kitchen therefore continue prophylactic medications and albuterol when necessary  BPH with nocturia..... Continue Flomax  Allergic rhinitis continue steroid nasal spray and Singulair  Reflux esophagitis,,,,,, OTC Prilosec  Status post cardiac bypass surgery......... asymptomatic therefore no further evaluation at this time.  History of depression........ continue Wellbutrin

## 2014-12-02 NOTE — Patient Instructions (Signed)
Continue current medications except Lipitor 10 mg.......... one daily at bedtime  Return in one year for general physical exam sooner if any problems

## 2014-12-02 NOTE — Progress Notes (Signed)
Pre visit review using our clinic review tool, if applicable. No additional management support is needed unless otherwise documented below in the visit note. 

## 2014-12-04 ENCOUNTER — Other Ambulatory Visit: Payer: Self-pay | Admitting: Family Medicine

## 2014-12-16 ENCOUNTER — Telehealth: Payer: Self-pay | Admitting: *Deleted

## 2014-12-16 ENCOUNTER — Other Ambulatory Visit: Payer: Self-pay

## 2014-12-16 MED ORDER — ATORVASTATIN CALCIUM 20 MG PO TABS: 20.0000 mg | ORAL_TABLET | Freq: Every day | ORAL | Status: DC

## 2014-12-16 NOTE — Telephone Encounter (Signed)
If he's been on atorvastatin 20 mg daily through trial and has had no side effects with it, would just continue on the 20 mg daily dose.

## 2014-12-16 NOTE — Telephone Encounter (Signed)
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 As part of the study, he has been taking atorvastatin 20 mg daily and anacetrapib 100 mg or placebo daily.  Both medications were provided to the patient as part of the study.  The study has ended and he will need a prescription for a statin.  It is recommended that he continues on a similar lipid-lowering potency to that which he has been taking during the study. Also, it is advised against measuring lipids at this stage as it may be difficult to interpret the results until several weeks after stopping the study treatment. He  will stop all study medication on 12/16/2014.  So that statin therapy is not interrupted, please call in a prescription for statin therapy for this patient.   Thank you, Blossom Hoops, RN, Research Nurse

## 2014-12-16 NOTE — Telephone Encounter (Signed)
Pt advised I will send prescription to pharmacy for atorvastatin 20mg  daily

## 2014-12-16 NOTE — Telephone Encounter (Signed)
Pt states cholesterol results were similar on atorvastatin 10mg  and atorvastatin 20mg . Pt asking if Dr Aundra Dubin still wants to prescribe atorvastatin 20mg  daily. I will forward to Dr Aundra Dubin for review.

## 2014-12-24 ENCOUNTER — Ambulatory Visit (INDEPENDENT_AMBULATORY_CARE_PROVIDER_SITE_OTHER): Payer: Medicare Other | Admitting: Cardiology

## 2014-12-24 ENCOUNTER — Encounter: Payer: Self-pay | Admitting: Cardiology

## 2014-12-24 VITALS — BP 138/80 | HR 60 | Ht 70.5 in | Wt 165.1 lb

## 2014-12-24 DIAGNOSIS — I2510 Atherosclerotic heart disease of native coronary artery without angina pectoris: Secondary | ICD-10-CM | POA: Diagnosis not present

## 2014-12-24 DIAGNOSIS — I519 Heart disease, unspecified: Secondary | ICD-10-CM

## 2014-12-24 DIAGNOSIS — I480 Paroxysmal atrial fibrillation: Secondary | ICD-10-CM | POA: Diagnosis not present

## 2014-12-24 DIAGNOSIS — I5189 Other ill-defined heart diseases: Secondary | ICD-10-CM

## 2014-12-24 DIAGNOSIS — R0602 Shortness of breath: Secondary | ICD-10-CM | POA: Diagnosis not present

## 2014-12-24 MED ORDER — DRONEDARONE HCL 400 MG PO TABS: 400.0000 mg | ORAL_TABLET | Freq: Two times a day (BID) | ORAL | Status: DC

## 2014-12-24 NOTE — Patient Instructions (Addendum)
   Medication Instructions:  Start Multaq 400mg  two times a day with food*  Labwork: BMET/BNP today  Testing/Procedures: None today  Follow-Up: Your physician recommends that you schedule a follow-up appointment in: 2 weeks for an EKG.  Your physician recommends that you schedule a follow-up appointment on January 6,2017 at [facility].          If you need a refill on your cardiac medications before your next appointment, please call your pharmacy.

## 2014-12-25 LAB — BASIC METABOLIC PANEL
BUN: 29 mg/dL — ABNORMAL HIGH (ref 7–25)
CALCIUM: 9 mg/dL (ref 8.6–10.3)
CO2: 27 mmol/L (ref 20–31)
CREATININE: 1.46 mg/dL — AB (ref 0.70–1.11)
Chloride: 99 mmol/L (ref 98–110)
Glucose, Bld: 78 mg/dL (ref 65–99)
Potassium: 4.3 mmol/L (ref 3.5–5.3)
Sodium: 135 mmol/L (ref 135–146)

## 2014-12-25 LAB — BRAIN NATRIURETIC PEPTIDE: BRAIN NATRIURETIC PEPTIDE: 102.8 pg/mL — AB (ref 0.0–100.0)

## 2014-12-25 NOTE — Progress Notes (Signed)
Patient ID: [name] Spence, MD, male   DOB: 1929/03/05, 81 y.o.   MRN: [id number] PCP: Dr. 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  He had an RV infarct with chronic total occlusion of a small nondominant RCA in the 1980s.  He had a drug-eluting stent to the CFX in 11/10.    He is very symptomatic with atrial fibrillation episodes. He has had a chronic pattern of mild shortness of breath and mild left shoulder pain/chest aching when walking up a hill (x 20 years).   He can walk 3 miles on flat ground without problems.  He golfs 3-4 times a week.  He walks for 20-30 minutes on a treadmill several times a week.   Echo in 7/16 showed normal LV systolic function, EF [id number] with moderate diastolic dysfunction.  Moderate TR with PA systolic pressure 65 mmHg.  Normal RV size and systolic function.  Given volume overload on exam and abnormal echo, he was started on low dose Lasix at last appointment. Weight is down 1 lb.  His breathing has changed minimally. Of note, he is taking 2 Ibuprofen pills twice a day and has done so for a long time.    He called in earlier this week in atrial fibrillation.  He says that atrial fibrillation "wipes me out."  After about 5 hours, he went back into NSR.    ECG: NSR, normal with rate 58  Labs (6/11): LDL 63, HDL 35, K 5.5, creatinine 1.1 Labs (6/12): LDL 58, HDL 53, TSH normal, K 4.7, creatinine 1 Labs (12/12): BNP 79 Labs (7/14): K 5, creatinine 1.1, LDL 48, HDL 34 Labs (8/15): LDL 48, HDL 33, TSH normal, K 5.4, creatinine 1.2 Labs (6/16): K 5.1, creatinine 1.27, HCT 42.5 Labs (10/16): LDL 37, HDL 32  PMH: 1. CAD: RV infarct in the 1980s.  Chronic total occlusion of a small nondominant RCA.  11/10 PCI to CFX with Xience DES.  EF 60% on LV-gram at that time. ETT (11/14) with 9'07" exercise, no significant ST depression.  2.  Paroxysmal atrial fibrillation 3.  Hyperlipidemia 4.  GERD 5. HTN 6. Asthma 7. Chronic diastolic CHF: Echo ([id number]): EF 55-60%, mild LV hypertrophy, no regional WMAs, mild AI, mild MR, mild-moderate biatrial enlargement, PA systolic pressure 51 mmHg. Echo (7/16) with EF 60-65%, moderate diastolic dysfunction, normal RV size and systolic function, mild-moderate MR, moderate TR, moderate biatrial enlargement, PA systolic pressure 65 mmHg.  8. CKD  SH: Nonsmoker.  Retired Stage manager, lives in [place].  2 daughters.  Originally from [place].   FH: Brother with CABG.   ROS: All systems reviewed and negative except as per HPI.    Current Outpatient Prescriptions  Medication Sig Dispense Refill  . atenolol (TENORMIN) 25 MG tablet Take 1 tablet (25 mg total) by mouth daily. 100 tablet 4  . atorvastatin (LIPITOR) 20 MG tablet Take 1 tablet (20 mg total) by mouth daily. 90 tablet 3  . beclomethasone (QVAR) 80 MCG/ACT inhaler 1 puff twice a day 3 Inhaler 4  . buPROPion (WELLBUTRIN SR) 150 MG 12 hr tablet Take 1 tablet (150 mg total) by mouth daily. 100 tablet 4  . cetirizine (ZYRTEC) 10 MG tablet Take 10 mg by mouth daily.    . Cholecalciferol (VITAMIN D3) 400 UNITS CAPS Take by mouth daily.      . fluticasone (FLONASE) 50 MCG/ACT nasal spray Place 2 sprays into both nostrils  daily. 32 g 6  . FLUZONE HIGH-DOSE 0.5 ML SUSY Inject as directed once.  0  . furosemide (LASIX) 20 MG tablet Take 1 tablet (20 mg total) by mouth daily. 100 tablet 3  . ibuprofen (ADVIL,MOTRIN) 200 MG tablet Take 400 mg by mouth daily.    . mometasone (NASONEX) 50 MCG/ACT nasal spray Place 2 sprays into the nose daily. (Patient taking differently: Place 2 sprays into the nose as needed. ) 34 g 11  . montelukast (SINGULAIR) 10 MG tablet TAKE 1 TABLET BY MOUTH ONCE DAILY 90 tablet 4  . Omega-3 Fatty Acids (FISH OIL) 1000 MG CAPS Take 1,000 mg by mouth daily.    Marland Kitchen omeprazole (PRILOSEC OTC) 20 MG tablet Take 1 tablet (20 mg total) by mouth daily. 100 tablet 4  . PROAIR HFA 108  (90 BASE) MCG/ACT inhaler INHALE 2 PUFFS INTO LUNGS EVERY 6 HOURS AS NEEDED FOR WHEEZING 8.5 g 0  . tamsulosin (FLOMAX) 0.4 MG CAPS capsule TAKE ONE CAPSULE BY MOUTH DAILY 100 capsule 4  . warfarin (COUMADIN) 5 MG tablet TAKE AS DIRECTED PER COUMADIN CLINIC. 90 tablet 1  . dronedarone (MULTAQ) 400 MG tablet Take 1 tablet (400 mg total) by mouth 2 (two) times daily with a meal. 60 tablet 2   No current facility-administered medications for this visit.    BP 138/80 mmHg  Pulse 60  Ht 5' 10.5" (1.791 m)  Wt 165 lb 1.9 oz (74.898 kg)  BMI 23.35 kg/m2 General: NAD Neck: JVP 7 cm, no thyromegaly or thyroid nodule.  Lungs: Clear to auscultation bilaterally with normal respiratory effort. CV: Nondisplaced PMI.  Heart regular S1/S2, soft S4, no murmur.  No edema.  No carotid bruit.  Normal pedal pulses.  Abdomen: Soft, nontender, no hepatosplenomegaly, no distention.  Neurologic: Alert and oriented x 3.  Psych: Normal affect. Extremities: No clubbing or cyanosis.   Assessment/Plan: 1. 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  This has really not changed  He is still able to golf, walk on a treadmill, etc with no problem.  No ischemic changes on ECG.  ETT in 11/14 was a normal study.  - If he has any change in his exertional symptoms, he will let me know and I will arrange for ETT-Cardiolite. .  - He is on warfarin without ASA given stable CAD.   - Continue statin, beta blocker.  2. Hyperlipidemia: Good lipids in 10/16.    3. Chronic diastolic CHF: Echo in [id number] suggested progressive diastolic dysfunction.  EF 60-65% with grade II diastolic dysfunction and PA systolic pressure up to 65 mmHg.  RV appeared normal.  On exam, I thought he had mild volume overload.  I started him on low dose Lasix.  He looks euvolemic on exam today. Check BMET/BNP. 4. Atrial fibrillation: Paroxysmal.  Episodes are symptomatic, he feels "bad" with atrial  fibrillation even when he's rate-controlled. They occur at least monthly. - We have discussed using a NOAC rather than warfarin.  For now, he wants to stay with warfarin but will let me know if he changes his mind.  - He is not a candidate to use a class Ic antiarrhythmic with a pill-in-pocket strategy given history of CAD.  We have discussed antiarrhythmics that he could potentially use.  Given only mild compensated CHF, I think that dronedarone would be an option.  I will start dronedarone 400 mg bid (need to take with food).  ECG in 2 weeks.  5. HTN: BP controlled.  6. CKD: He is using a fair amount of ibuprofen.  I asked him to cut back on ibuprofen use, try Tylenol as an alternative.   Followup with me in 6 wks.    Loralie Champagne 12/25/2014

## 2014-12-26 ENCOUNTER — Telehealth: Payer: Self-pay | Admitting: Cardiology

## 2014-12-26 DIAGNOSIS — I480 Paroxysmal atrial fibrillation: Secondary | ICD-10-CM

## 2014-12-26 DIAGNOSIS — I5189 Other ill-defined heart diseases: Secondary | ICD-10-CM

## 2014-12-26 NOTE — Telephone Encounter (Signed)
Discussed recent BMET results with patient.Marland Kitchen

## 2014-12-26 NOTE — Telephone Encounter (Signed)
Follow Up   Pt states that he is returning the call for results

## 2014-12-30 ENCOUNTER — Other Ambulatory Visit: Payer: Self-pay | Admitting: Family Medicine

## 2014-12-30 ENCOUNTER — Ambulatory Visit (INDEPENDENT_AMBULATORY_CARE_PROVIDER_SITE_OTHER): Payer: Medicare Other | Admitting: *Deleted

## 2014-12-30 DIAGNOSIS — Z5181 Encounter for therapeutic drug level monitoring: Secondary | ICD-10-CM

## 2014-12-30 DIAGNOSIS — I4891 Unspecified atrial fibrillation: Secondary | ICD-10-CM

## 2014-12-30 DIAGNOSIS — I480 Paroxysmal atrial fibrillation: Secondary | ICD-10-CM

## 2014-12-30 DIAGNOSIS — Z7901 Long term (current) use of anticoagulants: Secondary | ICD-10-CM | POA: Diagnosis not present

## 2014-12-30 LAB — POCT INR: INR: 2.4

## 2015-01-06 ENCOUNTER — Encounter: Payer: Self-pay | Admitting: Internal Medicine

## 2015-01-06 ENCOUNTER — Encounter: Payer: Self-pay | Admitting: *Deleted

## 2015-01-06 ENCOUNTER — Ambulatory Visit (INDEPENDENT_AMBULATORY_CARE_PROVIDER_SITE_OTHER): Payer: Medicare Other | Admitting: *Deleted

## 2015-01-06 ENCOUNTER — Other Ambulatory Visit (INDEPENDENT_AMBULATORY_CARE_PROVIDER_SITE_OTHER): Payer: Medicare Other | Admitting: *Deleted

## 2015-01-06 VITALS — BP 113/64 | HR 56 | Temp 96.6°F | Ht 70.5 in | Wt 166.8 lb

## 2015-01-06 DIAGNOSIS — I519 Heart disease, unspecified: Secondary | ICD-10-CM

## 2015-01-06 DIAGNOSIS — I5189 Other ill-defined heart diseases: Secondary | ICD-10-CM

## 2015-01-06 DIAGNOSIS — I480 Paroxysmal atrial fibrillation: Secondary | ICD-10-CM

## 2015-01-06 LAB — BASIC METABOLIC PANEL
BUN: 31 mg/dL — ABNORMAL HIGH (ref 7–25)
CHLORIDE: 104 mmol/L (ref 98–110)
CO2: 26 mmol/L (ref 20–31)
Calcium: 8.9 mg/dL (ref 8.6–10.3)
Creat: 1.36 mg/dL — ABNORMAL HIGH (ref 0.70–1.11)
Glucose, Bld: 62 mg/dL — ABNORMAL LOW (ref 65–99)
POTASSIUM: 4.4 mmol/L (ref 3.5–5.3)
Sodium: 137 mmol/L (ref 135–146)

## 2015-01-08 ENCOUNTER — Other Ambulatory Visit: Payer: Medicare Other

## 2015-01-13 DIAGNOSIS — H903 Sensorineural hearing loss, bilateral: Secondary | ICD-10-CM | POA: Diagnosis not present

## 2015-01-13 NOTE — Addendum Note (Signed)
Addended by: Freada Bergeron on: 01/13/2015 04:28 PM   Modules accepted: Orders

## 2015-01-23 ENCOUNTER — Other Ambulatory Visit: Payer: Self-pay | Admitting: Family Medicine

## 2015-01-24 ENCOUNTER — Other Ambulatory Visit: Payer: Self-pay | Admitting: *Deleted

## 2015-01-24 MED ORDER — FLUTICASONE PROPIONATE HFA 110 MCG/ACT IN AERO: 2.0000 | INHALATION_SPRAY | Freq: Every day | RESPIRATORY_TRACT | Status: DC

## 2015-02-14 ENCOUNTER — Ambulatory Visit (INDEPENDENT_AMBULATORY_CARE_PROVIDER_SITE_OTHER): Payer: Medicare Other | Admitting: *Deleted

## 2015-02-14 ENCOUNTER — Ambulatory Visit (INDEPENDENT_AMBULATORY_CARE_PROVIDER_SITE_OTHER): Payer: Medicare Other | Admitting: Cardiology

## 2015-02-14 VITALS — BP 134/70 | HR 61 | Ht 71.0 in | Wt 172.0 lb

## 2015-02-14 DIAGNOSIS — Z5181 Encounter for therapeutic drug level monitoring: Secondary | ICD-10-CM

## 2015-02-14 DIAGNOSIS — I2510 Atherosclerotic heart disease of native coronary artery without angina pectoris: Secondary | ICD-10-CM

## 2015-02-14 DIAGNOSIS — I4891 Unspecified atrial fibrillation: Secondary | ICD-10-CM | POA: Diagnosis not present

## 2015-02-14 DIAGNOSIS — Z7901 Long term (current) use of anticoagulants: Secondary | ICD-10-CM | POA: Diagnosis not present

## 2015-02-14 DIAGNOSIS — I480 Paroxysmal atrial fibrillation: Secondary | ICD-10-CM

## 2015-02-14 LAB — POCT INR: INR: 1.9

## 2015-02-14 LAB — BASIC METABOLIC PANEL
BUN: 28 mg/dL — AB (ref 7–25)
CHLORIDE: 102 mmol/L (ref 98–110)
CO2: 27 mmol/L (ref 20–31)
Calcium: 9.3 mg/dL (ref 8.6–10.3)
Creat: 1.3 mg/dL — ABNORMAL HIGH (ref 0.70–1.11)
GLUCOSE: 87 mg/dL (ref 65–99)
POTASSIUM: 4.6 mmol/L (ref 3.5–5.3)
SODIUM: 136 mmol/L (ref 135–146)

## 2015-02-14 NOTE — Patient Instructions (Signed)
Medication Instructions:  Stop Multaq  Labwork: BMET today  Testing/Procedures: None   Follow-Up: Your physician recommends that you schedule a follow-up appointment in: 2 months with Dr Aundra Dubin.      If you need a refill on your cardiac medications before your next appointment, please call your pharmacy.

## 2015-02-15 ENCOUNTER — Encounter: Payer: Self-pay | Admitting: Cardiology

## 2015-02-15 NOTE — Progress Notes (Signed)
Patient ID: [name] Spence, MD, male   DOB: May 16, 1929, 82 y.o.   MRN: [id number] PCP: Dr. 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  He had an RV infarct with chronic total occlusion of a small nondominant RCA in the 1980s.  He had a drug-eluting stent to the CFX in 11/10.    He is very symptomatic with atrial fibrillation episodes. He has had a chronic pattern of mild shortness of breath and mild left shoulder pain/chest aching when walking up a hill (x 20 years).   He can walk 3 miles on flat ground without problems.  He golfs 3-4 times a week.  He walks for 20-30 minutes on a treadmill several times a week.   Echo in 7/16 showed normal LV systolic function, EF [id number] with moderate diastolic dysfunction.  Moderate TR with PA systolic pressure 65 mmHg.  Normal RV size and systolic function.  Given volume overload on exam and abnormal echo, he was started on low dose Lasix in the past.    At last appointment, he was started on Multaq.  He had been having an episode of atrial fibrillation lasting about 12 hours about once a month.  Since starting Multaq, he feels like episodes have actually become more frequent, once a week.  They still last about 12 hours at a time.  He feels "wiped out" during episodes of atrial fibrillation.  He had overnight oximetry about a month ago without signficant desaturation.    ECG: ectopic atrial rhythm, nonspecific T wave flattening, QTc 448 msec  Labs (6/11): LDL 63, HDL 35, K 5.5, creatinine 1.1 Labs (6/12): LDL 58, HDL 53, TSH normal, K 4.7, creatinine 1 Labs (12/12): BNP 79 Labs (7/14): K 5, creatinine 1.1, LDL 48, HDL 34 Labs (8/15): LDL 48, HDL 33, TSH normal, K 5.4, creatinine 1.2 Labs (6/16): K 5.1, creatinine 1.27, HCT 42.5 Labs (10/16): LDL 37, HDL 32 Labs (11/16): K 4.4, creatinine 1.36, BNP 103  PMH: 1. CAD: RV infarct in the 1980s.  Chronic total occlusion of a small  nondominant RCA.  11/10 PCI to CFX with Xience DES.  EF 60% on LV-gram at that time. ETT (11/14) with 9'07" exercise, no significant ST depression.  2.  Paroxysmal atrial fibrillation: Multaq was ineffective.  3.  Hyperlipidemia 4. GERD 5. HTN 6. Asthma 7. Chronic diastolic CHF: Echo ([id number]): EF 55-60%, mild LV hypertrophy, no regional WMAs, mild AI, mild MR, mild-moderate biatrial enlargement, PA systolic pressure 51 mmHg. Echo (7/16) with EF 60-65%, moderate diastolic dysfunction, normal RV size and systolic function, mild-moderate MR, moderate TR, moderate biatrial enlargement, PA systolic pressure 65 mmHg.  8. CKD  SH: Nonsmoker.  Retired Stage manager, lives in [place].  2 daughters.  Originally from [place].   FH: Brother with CABG.   ROS: All systems reviewed and negative except as per HPI.    Current Outpatient Prescriptions  Medication Sig Dispense Refill  . atenolol (TENORMIN) 25 MG tablet Take 1 tablet (25 mg total) by mouth daily. 100 tablet 4  . atorvastatin (LIPITOR) 20 MG tablet Take 1 tablet (20 mg total) by mouth daily. 90 tablet 3  . buPROPion (WELLBUTRIN SR) 150 MG 12 hr tablet Take 1 tablet (150 mg total) by mouth daily. 100 tablet 4  . cetirizine (ZYRTEC) 10 MG tablet Take 10 mg by mouth daily.    . Cholecalciferol (VITAMIN D3) 400 UNITS CAPS Take by mouth daily.      Marland Kitchen  fluticasone (FLONASE) 50 MCG/ACT nasal spray Place 2 sprays into both nostrils daily. 32 g 6  . fluticasone (FLOVENT HFA) 110 MCG/ACT inhaler Inhale 2 puffs into the lungs daily. 3 Inhaler 12  . FLUZONE HIGH-DOSE 0.5 ML SUSY Inject as directed once.  0  . furosemide (LASIX) 20 MG tablet 1 tablet my mouth every other day.    . montelukast (SINGULAIR) 10 MG tablet TAKE 1 TABLET BY MOUTH ONCE DAILY 90 tablet 4  . Omega-3 Fatty Acids (FISH OIL) 1000 MG CAPS Take 1,000 mg by mouth daily.    Marland Kitchen omeprazole (PRILOSEC OTC) 20 MG tablet Take 1 tablet (20 mg total) by mouth daily. 100 tablet 4  . PROAIR HFA  108 (90 BASE) MCG/ACT inhaler INHALE 2 PUFFS INTO THE LUNGS EVERY 6 HOURS AS NEEDED FOR WHEEZING 8.5 g 0  . QVAR 80 MCG/ACT inhaler USE 1 PUFF BY MOUTH TWICE DAILY 26.1 g 3  . tamsulosin (FLOMAX) 0.4 MG CAPS capsule TAKE ONE CAPSULE BY MOUTH DAILY 100 capsule 4  . warfarin (COUMADIN) 5 MG tablet TAKE AS DIRECTED PER COUMADIN CLINIC. 90 tablet 1   No current facility-administered medications for this visit.    BP 134/70 mmHg  Pulse 61  Ht 5\' 11"  (1.803 m)  Wt 172 lb (78.019 kg)  BMI 24.00 kg/m2 General: NAD Neck: JVP 7 cm, no thyromegaly or thyroid nodule.  Lungs: Clear to auscultation bilaterally with normal respiratory effort. CV: Nondisplaced PMI.  Heart regular S1/S2, soft S4, no murmur.  1+ ankle edema.  No carotid bruit.  Normal pedal pulses.  Abdomen: Soft, nontender, no hepatosplenomegaly, no distention.  Neurologic: Alert and oriented x 3.  Psych: Normal affect. Extremities: No clubbing or cyanosis.   Assessment/Plan: 1. 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  This has really not changed  He is still able to golf, walk on a treadmill, etc with no problem.  No ischemic changes on ECG.  ETT in 11/14 was a normal study.  - If he has any change in his exertional symptoms, he will let me know and I will arrange for ETT-Cardiolite.   - He is on warfarin without ASA given stable CAD.   - Continue statin, beta blocker.  2. Hyperlipidemia: Good lipids in 10/16.    3. Chronic diastolic CHF: Echo in [id number] suggested progressive diastolic dysfunction.  EF 60-65% with grade II diastolic dysfunction and PA systolic pressure up to 65 mmHg.  RV appeared normal. He looks euvolemic today on Lasix 20 mg every other day.  Will check BMET today. 4. Atrial fibrillation: Paroxysmal.  Episodes are symptomatic, he feels "bad" with atrial fibrillation even when he's rate-controlled. They were occurring monthly, but now are weekly since he  has been on Multaq. - We have discussed using a NOAC rather than warfarin.  For now, he wants to stay with warfarin but will let me know if he changes his mind.  - He seems to have failed Multaq and will stop it.  He is not a candidate to use a class Ic antiarrhythmic given history of CAD.  We will see if he continues to have weekly atrial fibrillation episodes off Multaq or if he goes back to his prior frequency of every 1-2 months.  If atrial fibrillation frequency decreases, will not put him back on an antiarrhythmic.  If frequency stays the same, will consider ablation versus a different antiarrhythmic.  He could use  Tikosyn or amiodarone.  I will see him back in 2 months to see how he is doing.   5. HTN: BP controlled.  6. CKD: He is no longer using NSAIDs.  Will get BMET today.    Loralie Champagne 02/15/2015

## 2015-03-06 ENCOUNTER — Ambulatory Visit (HOSPITAL_COMMUNITY)
Admission: RE | Admit: 2015-03-06 | Discharge: 2015-03-06 | Disposition: A | Discharge status: Home / Self Care | Payer: Medicare Other | Source: Ambulatory Visit | Attending: Nurse Practitioner | Admitting: Nurse Practitioner

## 2015-03-06 ENCOUNTER — Encounter (HOSPITAL_COMMUNITY): Payer: Self-pay | Admitting: Nurse Practitioner

## 2015-03-06 VITALS — BP 116/78 | HR 87 | Ht 70.0 in | Wt 168.0 lb

## 2015-03-06 DIAGNOSIS — Z7901 Long term (current) use of anticoagulants: Secondary | ICD-10-CM | POA: Diagnosis not present

## 2015-03-06 DIAGNOSIS — Z955 Presence of coronary angioplasty implant and graft: Secondary | ICD-10-CM | POA: Diagnosis not present

## 2015-03-06 DIAGNOSIS — I10 Essential (primary) hypertension: Secondary | ICD-10-CM | POA: Diagnosis not present

## 2015-03-06 DIAGNOSIS — I2510 Atherosclerotic heart disease of native coronary artery without angina pectoris: Secondary | ICD-10-CM | POA: Insufficient documentation

## 2015-03-06 DIAGNOSIS — E785 Hyperlipidemia, unspecified: Secondary | ICD-10-CM | POA: Insufficient documentation

## 2015-03-06 DIAGNOSIS — I4891 Unspecified atrial fibrillation: Secondary | ICD-10-CM | POA: Diagnosis present

## 2015-03-06 DIAGNOSIS — Z8249 Family history of ischemic heart disease and other diseases of the circulatory system: Secondary | ICD-10-CM | POA: Diagnosis not present

## 2015-03-06 DIAGNOSIS — I252 Old myocardial infarction: Secondary | ICD-10-CM | POA: Diagnosis not present

## 2015-03-06 DIAGNOSIS — Z79899 Other long term (current) drug therapy: Secondary | ICD-10-CM | POA: Diagnosis not present

## 2015-03-06 DIAGNOSIS — J45909 Unspecified asthma, uncomplicated: Secondary | ICD-10-CM | POA: Insufficient documentation

## 2015-03-06 DIAGNOSIS — K219 Gastro-esophageal reflux disease without esophagitis: Secondary | ICD-10-CM | POA: Insufficient documentation

## 2015-03-06 DIAGNOSIS — I481 Persistent atrial fibrillation: Secondary | ICD-10-CM | POA: Diagnosis not present

## 2015-03-06 DIAGNOSIS — I4819 Other persistent atrial fibrillation: Secondary | ICD-10-CM

## 2015-03-06 LAB — MAGNESIUM: MAGNESIUM: 2 mg/dL (ref 1.7–2.4)

## 2015-03-06 LAB — BASIC METABOLIC PANEL
Anion gap: 7 (ref 5–15)
BUN: 22 mg/dL — AB (ref 6–20)
CO2: 27 mmol/L (ref 22–32)
CREATININE: 1.35 mg/dL — AB (ref 0.61–1.24)
Calcium: 9.2 mg/dL (ref 8.9–10.3)
Chloride: 104 mmol/L (ref 101–111)
GFR, EST AFRICAN AMERICAN: 54 mL/min — AB (ref >60)
GFR, EST NON AFRICAN AMERICAN: 47 mL/min — AB (ref >60)
Glucose, Bld: 111 mg/dL — ABNORMAL HIGH (ref 65–99)
POTASSIUM: 4.5 mmol/L (ref 3.5–5.1)
SODIUM: 138 mmol/L (ref 135–145)

## 2015-03-06 LAB — CBC
HCT: 45.5 % (ref 39.0–52.0)
HEMOGLOBIN: 14.4 g/dL (ref 13.0–17.0)
MCH: 29.4 pg (ref 26.0–34.0)
MCHC: 31.6 g/dL (ref 30.0–36.0)
MCV: 93 fL (ref 78.0–100.0)
PLATELETS: 268 10*3/uL (ref 150–400)
RBC: 4.89 MIL/uL (ref 4.22–5.81)
RDW: 13.3 % (ref 11.5–15.5)
WBC: 10 10*3/uL (ref 4.0–10.5)

## 2015-03-06 LAB — PROTIME-INR
INR: 2.33 — AB (ref 0.00–1.49)
PROTHROMBIN TIME: 25.3 s — AB (ref 11.6–15.2)

## 2015-03-06 NOTE — Progress Notes (Signed)
Patient ID: [name] Spence, MD, male   DOB: 17-Nov-1929, 82 y.o.   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 Over the last few months, he has had a higher afib burden. Not a candidate for 1C agents, due to h/o CAD. Recently tried multaq but felt like it exacerbated afib episodes. This was stopped. He has been in persistent afib x 4 days, rate controlled. He does notice fatigue and exertional dyspnea when in afib. He is in the afib clinic today for discussion of best approach to restore sinus rhythm. He drinks minimal caffeine, no alcohol. Some snoring per wife, but no significant apnea spells. No prior sleep study but did have overnight pule oximetry in the remote past which did not show any significant desaturations.  Discussed ablation vrs amiodarone or tikosyn. Pt is concerned re possible side effects of amiodarone, more interested in [place]. He has a calculated GFR of 43 and it was discussed with pt he would require dose adjustment, probably to start around 250 mg bid. QTc reviewed and it looks satisfactory to use tiksoyn. Current drug therapy does not show any drugs that would be an issue with tikosyn use. Discussed ablation but due to age and size of left atrium(51 mm), chances of success would be only around 60-65%, with one out of 4 people needing a redo procedure. Last INR 1/6 was at 1.9.  Dr. Aundra Dubin came in to see pt, and reiterated above discussion and pt decided he would like to pursue Tikosyn. He is planning a trip to [place] 2/6 and would like to be loaded and go on with his plans, if possible. Because last INR was subtherapeutic,he will require TEE before initiation  of tikosyn.   Today, he denies symptoms of palpitations, chest pain, shortness of breath, orthopnea, PND, lower extremity edema,  dizziness, presyncope, syncope, or neurologic sequela. The patient is tolerating medications without difficulties and is otherwise without complaint today.   Past Medical History  Diagnosis Date  . CAD (coronary artery disease)   . Sinus bradycardia   . Anxiety   . Asthma   . Atrial fibrillation ([place])   . GERD (gastroesophageal reflux disease) 12/30/2000  . Hyperlipidemia   . Hypertension   . Allergic rhinitis   . Diverticulosis 03/16/1995  . Hiatal hernia 12/30/2000  . History of duodenal ulcer 10/15/1986  . Heart attack ([place]) 1984   Past Surgical History  Procedure Laterality Date  . Ent surgery  1972  . Hospital ccu  1984    heart attack, hypoplastic right coronary  . Total knee replacements      bilateral  . Coronary stent placement  2010  . Cholecystectomy  1989  . Vagotomy and pyloroplasty  1985    GI bleed  . Tonsillectomy      Current Outpatient Prescriptions  Medication Sig Dispense Refill  . atenolol (TENORMIN) 25 MG tablet Take 1 tablet (25 mg total) by mouth daily. 100 tablet 4  . atorvastatin (LIPITOR) 20 MG tablet Take 1 tablet (20 mg total) by mouth daily. 90 tablet 3  . buPROPion (WELLBUTRIN SR) 150 MG 12 hr tablet Take 1 tablet (150 mg total) by mouth daily. 100 tablet 4  . cetirizine (ZYRTEC) 10 MG tablet Take 10 mg by mouth daily.    Marland Kitchen  Cholecalciferol (VITAMIN D3) 400 UNITS CAPS Take by mouth daily.      . fluticasone (FLONASE) 50 MCG/ACT nasal spray Place 2 sprays into both nostrils daily. 32 g 6  . fluticasone (FLOVENT HFA) 110 MCG/ACT inhaler Inhale 2 puffs into the lungs daily. 3 Inhaler 12  . FLUZONE HIGH-DOSE 0.5 ML SUSY Inject as directed once.  0  . furosemide (LASIX) 20 MG tablet 1 tablet my mouth every other day.    . montelukast (SINGULAIR) 10 MG tablet TAKE 1 TABLET BY MOUTH ONCE DAILY 90 tablet 4  . Omega-3 Fatty Acids (FISH OIL) 1000 MG CAPS Take 1,000 mg by mouth daily.    Marland Kitchen omeprazole (PRILOSEC OTC) 20 MG tablet Take 1 tablet (20 mg total)  by mouth daily. 100 tablet 4  . PROAIR HFA 108 (90 BASE) MCG/ACT inhaler INHALE 2 PUFFS INTO THE LUNGS EVERY 6 HOURS AS NEEDED FOR WHEEZING 8.5 g 0  . QVAR 80 MCG/ACT inhaler USE 1 PUFF BY MOUTH TWICE DAILY 26.1 g 3  . tamsulosin (FLOMAX) 0.4 MG CAPS capsule TAKE ONE CAPSULE BY MOUTH DAILY 100 capsule 4  . warfarin (COUMADIN) 5 MG tablet TAKE AS DIRECTED PER COUMADIN CLINIC. 90 tablet 1   No current facility-administered medications for this encounter.    No Known Allergies  Social History   Social History  . Marital Status: Married    Spouse Name: N/A  . Number of Children: 2  . Years of Education: N/A   Occupational History  . DOCTOR    Social History Main Topics  . Smoking status: Never Smoker   . Smokeless tobacco: Never Used  . Alcohol Use: No  . Drug Use: No  . Sexual Activity: Not on file   Other Topics Concern  . Not on file   Social History Narrative   Married. Regular exercise - yes.     Family History  Problem Relation Age of Onset  . Coronary artery disease Mother     deceased  . Deep vein thrombosis Sister   . Coronary artery disease Brother   . Asthma Brother   . Colon cancer Neg Hx     ROS- All systems are reviewed and negative except as per the HPI above  Physical Exam: Filed Vitals:   03/06/15 0843  BP: 116/78  Pulse: 87  Height: 5\' 10"  (1.778 m)  Weight: 168 lb (76.204 kg)    GEN- The patient is well appearing, alert and oriented x 3 today.   Head- normocephalic, atraumatic Eyes-  Sclera clear, conjunctiva pink Ears- hearing intact Oropharynx- clear Neck- supple, no JVP Lymph- no cervical lymphadenopathy Lungs- Clear to ausculation bilaterally, normal work of breathing Heart- irregular rate and rhythm, no murmurs, rubs or gallops, PMI not laterally displaced GI- soft, NT, ND, + BS Extremities- no clubbing, cyanosis, or edema MS- no significant deformity or atrophy Skin- no rash or lesion Psych- euthymic mood, full affect Neuro-  strength and sensation are intact  EKG-afib at 87 bpm, qrs int 80 ms, qtc 423 ms Epic records reviewed Echo- Left ventricle: The cavity size was normal. Wall thickness was normal. Systolic function was normal. The estimated ejection fraction was in the range of 60% to 65%. Wall motion was normal; there were no regional wall motion abnormalities. Features are consistent with a pseudonormal left ventricular filling pattern, with concomitant abnormal relaxation and increased filling pressure (grade 2 diastolic dysfunction). - Aortic valve: There was moderate to severe stenosis. There was trivial regurgitation. Valve area (  VTI): 1.01 cm^2. Valve area (Vmax): 1.04 cm^2. Valve area (Vmean): 0.97 cm^2. - Mitral valve: Calcified annulus. - Left atrium: The atrium was mildly dilated.Size 51 mm Creatinine 1.3 on 1/6 and creatinine clearance calculated at 46.4  Assessment and Plan: 1. Persistent symptomatic afib with rate control Therapeutic strategies for afib including medicine, tikosyn vrs amiodarone, were discussed in detail with the patient today.  Radiofrequency ablation for afib was also discussed in detail today.  For now continue atenolol Pt would like to proceed with tiksoyn loading and arranged with Fuller Canada, PharmD,for an intake appointment at [facility] office , Tuesday, February 1st, at 10 am. He will be set up for TEE with Dr. Aundra Dubin at noon due to INR at 1.9, 1/6. PT, INR, CBC, BMET, MAG today   Butch Penny C. [facility] [address] [place], [place] [address] [phone]  Treatment plan decisions made today in collaboration  with Dr. Aundra Dubin

## 2015-03-11 ENCOUNTER — Encounter (HOSPITAL_COMMUNITY)
Admission: AD | Disposition: A | Discharge status: Home / Self Care | Payer: Self-pay | Source: Ambulatory Visit | Attending: Cardiology

## 2015-03-11 ENCOUNTER — Ambulatory Visit (INDEPENDENT_AMBULATORY_CARE_PROVIDER_SITE_OTHER): Payer: Medicare Other | Admitting: Pharmacist

## 2015-03-11 ENCOUNTER — Ambulatory Visit: Payer: Medicare Other | Admitting: Pharmacist

## 2015-03-11 ENCOUNTER — Inpatient Hospital Stay (HOSPITAL_COMMUNITY)
Admission: AD | Admit: 2015-03-11 | Discharge: 2015-03-14 | DRG: 309 | Disposition: A | Discharge status: Home / Self Care | Payer: Medicare Other | Source: Ambulatory Visit | Attending: Cardiology | Admitting: Cardiology

## 2015-03-11 ENCOUNTER — Encounter (HOSPITAL_COMMUNITY): Payer: Self-pay | Admitting: General Practice

## 2015-03-11 VITALS — Wt 169.5 lb

## 2015-03-11 DIAGNOSIS — Z955 Presence of coronary angioplasty implant and graft: Secondary | ICD-10-CM | POA: Diagnosis not present

## 2015-03-11 DIAGNOSIS — J45909 Unspecified asthma, uncomplicated: Secondary | ICD-10-CM | POA: Diagnosis present

## 2015-03-11 DIAGNOSIS — I480 Paroxysmal atrial fibrillation: Secondary | ICD-10-CM | POA: Diagnosis not present

## 2015-03-11 DIAGNOSIS — Z7901 Long term (current) use of anticoagulants: Secondary | ICD-10-CM | POA: Diagnosis not present

## 2015-03-11 DIAGNOSIS — Z9861 Coronary angioplasty status: Secondary | ICD-10-CM

## 2015-03-11 DIAGNOSIS — I110 Hypertensive heart disease with heart failure: Secondary | ICD-10-CM | POA: Diagnosis not present

## 2015-03-11 DIAGNOSIS — I4819 Other persistent atrial fibrillation: Secondary | ICD-10-CM

## 2015-03-11 DIAGNOSIS — I481 Persistent atrial fibrillation: Secondary | ICD-10-CM

## 2015-03-11 DIAGNOSIS — I4891 Unspecified atrial fibrillation: Secondary | ICD-10-CM

## 2015-03-11 DIAGNOSIS — Z96653 Presence of artificial knee joint, bilateral: Secondary | ICD-10-CM | POA: Diagnosis not present

## 2015-03-11 DIAGNOSIS — Z5181 Encounter for therapeutic drug level monitoring: Secondary | ICD-10-CM | POA: Diagnosis not present

## 2015-03-11 DIAGNOSIS — Z79899 Other long term (current) drug therapy: Secondary | ICD-10-CM | POA: Diagnosis not present

## 2015-03-11 DIAGNOSIS — K219 Gastro-esophageal reflux disease without esophagitis: Secondary | ICD-10-CM | POA: Diagnosis present

## 2015-03-11 DIAGNOSIS — F419 Anxiety disorder, unspecified: Secondary | ICD-10-CM | POA: Diagnosis present

## 2015-03-11 DIAGNOSIS — I2510 Atherosclerotic heart disease of native coronary artery without angina pectoris: Secondary | ICD-10-CM

## 2015-03-11 DIAGNOSIS — E785 Hyperlipidemia, unspecified: Secondary | ICD-10-CM | POA: Diagnosis present

## 2015-03-11 DIAGNOSIS — I10 Essential (primary) hypertension: Secondary | ICD-10-CM

## 2015-03-11 DIAGNOSIS — I5032 Chronic diastolic (congestive) heart failure: Secondary | ICD-10-CM | POA: Diagnosis present

## 2015-03-11 DIAGNOSIS — I252 Old myocardial infarction: Secondary | ICD-10-CM | POA: Diagnosis not present

## 2015-03-11 HISTORY — DX: Depression, unspecified: F32.A

## 2015-03-11 HISTORY — DX: Unspecified osteoarthritis, unspecified site: M19.90

## 2015-03-11 HISTORY — DX: Personal history of other medical treatment: Z92.89

## 2015-03-11 HISTORY — DX: Major depressive disorder, single episode, unspecified: F32.9

## 2015-03-11 LAB — BASIC METABOLIC PANEL
ANION GAP: 7 (ref 5–15)
BUN: 20 mg/dL (ref 6–20)
CALCIUM: 9.2 mg/dL (ref 8.9–10.3)
CO2: 29 mmol/L (ref 22–32)
Chloride: 104 mmol/L (ref 101–111)
Creatinine, Ser: 1.23 mg/dL (ref 0.61–1.24)
GFR calc Af Amer: >60 mL/min (ref >60)
GFR calc non Af Amer: 52 mL/min — ABNORMAL LOW (ref >60)
GLUCOSE: 112 mg/dL — AB (ref 65–99)
Potassium: 4.3 mmol/L (ref 3.5–5.1)
Sodium: 140 mmol/L (ref 135–145)

## 2015-03-11 LAB — MAGNESIUM: Magnesium: 2.1 mg/dL (ref 1.7–2.4)

## 2015-03-11 LAB — POCT INR: INR: 2.6

## 2015-03-11 SURGERY — CANCELLED PROCEDURE

## 2015-03-11 MED ORDER — SODIUM CHLORIDE 0.9 % IV SOLN: 250.0000 mL | INTRAVENOUS | Status: DC | PRN

## 2015-03-11 MED ORDER — FUROSEMIDE 20 MG PO TABS: 20.0000 mg | ORAL_TABLET | Freq: Every day | ORAL | Status: DC

## 2015-03-11 MED ORDER — WARFARIN SODIUM 2.5 MG PO TABS
2.5000 mg | ORAL_TABLET | ORAL | Status: DC
  Administered 2015-03-12: 2.5 mg via ORAL
  Filled 2015-03-11: qty 1

## 2015-03-11 MED ORDER — BUDESONIDE 0.25 MG/2ML IN SUSP
0.2500 mg | Freq: Two times a day (BID) | RESPIRATORY_TRACT | Status: DC
  Administered 2015-03-11 – 2015-03-14 (×6): 0.25 mg via RESPIRATORY_TRACT
  Filled 2015-03-11 (×7): qty 2

## 2015-03-11 MED ORDER — DOFETILIDE 250 MCG PO CAPS
250.0000 ug | ORAL_CAPSULE | Freq: Two times a day (BID) | ORAL | Status: DC
  Administered 2015-03-11 – 2015-03-14 (×6): 250 ug via ORAL
  Filled 2015-03-11 (×6): qty 1

## 2015-03-11 MED ORDER — SODIUM CHLORIDE 0.9% FLUSH: 3.0000 mL | INTRAVENOUS | Status: DC | PRN

## 2015-03-11 MED ORDER — ONDANSETRON HCL 4 MG/2ML IJ SOLN: 4.0000 mg | Freq: Four times a day (QID) | INTRAMUSCULAR | Status: DC | PRN

## 2015-03-11 MED ORDER — ATENOLOL 25 MG PO TABS
25.0000 mg | ORAL_TABLET | Freq: Every day | ORAL | Status: DC
  Administered 2015-03-12 – 2015-03-14 (×3): 25 mg via ORAL
  Filled 2015-03-11 (×3): qty 1

## 2015-03-11 MED ORDER — ASPIRIN EC 81 MG PO TBEC: 81.0000 mg | DELAYED_RELEASE_TABLET | Freq: Every day | ORAL | Status: DC

## 2015-03-11 MED ORDER — OMEGA-3-ACID ETHYL ESTERS 1 G PO CAPS
1.0000 g | ORAL_CAPSULE | Freq: Every day | ORAL | Status: DC
  Administered 2015-03-11 – 2015-03-14 (×4): 1 g via ORAL
  Filled 2015-03-11 (×4): qty 1

## 2015-03-11 MED ORDER — PANTOPRAZOLE SODIUM 20 MG PO TBEC
20.0000 mg | DELAYED_RELEASE_TABLET | Freq: Every day | ORAL | Status: DC
  Administered 2015-03-11 – 2015-03-14 (×4): 20 mg via ORAL
  Filled 2015-03-11 (×4): qty 1

## 2015-03-11 MED ORDER — MONTELUKAST SODIUM 10 MG PO TABS
10.0000 mg | ORAL_TABLET | Freq: Every day | ORAL | Status: DC
  Administered 2015-03-11 – 2015-03-13 (×3): 10 mg via ORAL
  Filled 2015-03-11 (×3): qty 1

## 2015-03-11 MED ORDER — NITROGLYCERIN 0.4 MG SL SUBL: 0.4000 mg | SUBLINGUAL_TABLET | SUBLINGUAL | Status: DC | PRN

## 2015-03-11 MED ORDER — WARFARIN SODIUM 5 MG PO TABS
5.0000 mg | ORAL_TABLET | ORAL | Status: AC
  Administered 2015-03-11: 5 mg via ORAL
  Filled 2015-03-11: qty 1

## 2015-03-11 MED ORDER — WARFARIN - PHARMACIST DOSING INPATIENT
Freq: Every day | Status: DC
  Administered 2015-03-12: 18:00:00

## 2015-03-11 MED ORDER — SODIUM CHLORIDE 0.9 % IV SOLN: INTRAVENOUS | Status: DC

## 2015-03-11 MED ORDER — BUPROPION HCL ER (SR) 150 MG PO TB12
150.0000 mg | ORAL_TABLET | Freq: Every day | ORAL | Status: DC
  Administered 2015-03-12 – 2015-03-14 (×3): 150 mg via ORAL
  Filled 2015-03-11 (×3): qty 1

## 2015-03-11 MED ORDER — TAMSULOSIN HCL 0.4 MG PO CAPS
0.4000 mg | ORAL_CAPSULE | Freq: Every day | ORAL | Status: DC
  Administered 2015-03-11 – 2015-03-14 (×4): 0.4 mg via ORAL
  Filled 2015-03-11 (×4): qty 1

## 2015-03-11 MED ORDER — ATORVASTATIN CALCIUM 20 MG PO TABS
20.0000 mg | ORAL_TABLET | Freq: Every day | ORAL | Status: DC
  Administered 2015-03-11 – 2015-03-13 (×3): 20 mg via ORAL
  Filled 2015-03-11 (×3): qty 1

## 2015-03-11 MED ORDER — SODIUM CHLORIDE 0.9% FLUSH
3.0000 mL | Freq: Two times a day (BID) | INTRAVENOUS | Status: DC
  Administered 2015-03-12 – 2015-03-13 (×3): 3 mL via INTRAVENOUS

## 2015-03-11 MED ORDER — ACETAMINOPHEN 325 MG PO TABS: 650.0000 mg | ORAL_TABLET | ORAL | Status: DC | PRN

## 2015-03-11 MED ORDER — ALBUTEROL SULFATE (2.5 MG/3ML) 0.083% IN NEBU: 3.0000 mL | INHALATION_SOLUTION | Freq: Four times a day (QID) | RESPIRATORY_TRACT | Status: DC | PRN

## 2015-03-11 NOTE — Progress Notes (Signed)
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 Aundra Dubin, also seen by Roderic Palau in [place] clinic, who presents for Tikosyn initiation. Pt has a hx of PAF, dating back 20 years.Over the last few months, he has had a higher afib burden. Not a candidate for 1C agents due to h/o CAD. Recently tried multaq but felt like it exacerbated afib episodes. This was stopped. He does notice fatigue and exertional dyspnea when in afib. Had discussion with Roderic Palau for ablation vs amiodarone or tikosyn. Pt was concerned re possible side effects of amiodarone, more interested in [place]. Discussed ablation but due to age and size of left atrium(51 mm), chances of success would be only around 60-65%, with one out of 4 people needing a redo procedure. Pt planning a trip to [place] 2/6 and presents for Tikosyn initiation prior to his trip.    Patient presents today to clinic alone. We reviewed potential side effects of Tikosyn including QTc prolongation.He is aware of the importance of not missing doses and will call the office if he misses more than 2 doses in a row. He is aware to inform all of his other providers about Tikosyn and to call the office if there is ever a question about taking a new medication. Reviewed patient's medication list - he is not on any QTc prolonging drugs. He is anticoagulated on Coumadin. He has not had 4 weekly INR checks and pt had 1.9 INR recently on 1/6. He was in afib on 1/26 so a TEE was scheduled for today at noon (of note, patient is in NSR today but pt reports that he was told he still needs TEE by Dr. Aundra Dubin).   EKG reviewed by Dr. Lovena Le. Normal sinus rhythm. Vent rate 63 bpm, QTc 434msec.  Labs: K 4.3, Mg 2.1, SCr 1.23, Wt 170 lbs, CrCl 70mL/min  Past Medical History  Diagnosis Date  . CAD (coronary artery disease)   . Sinus bradycardia   .  Anxiety   . Asthma   . Atrial fibrillation ([place])   . GERD (gastroesophageal reflux disease) 12/30/2000  . Hyperlipidemia   . Hypertension   . Allergic rhinitis   . Diverticulosis 03/16/1995  . Hiatal hernia 12/30/2000  . History of duodenal ulcer 10/15/1986  . Heart attack ([place]) 1984   Current Outpatient Prescriptions on File Prior to Visit  Medication Sig Dispense Refill  . atenolol (TENORMIN) 25 MG tablet Take 1 tablet (25 mg total) by mouth daily. (Patient taking differently: Take 25-50 mg by mouth every evening. If pt wakes up in A-fib he takes an additional 25 mg) 100 tablet 4  . atorvastatin (LIPITOR) 20 MG tablet Take 1 tablet (20 mg total) by mouth daily. 90 tablet 3  . buPROPion (WELLBUTRIN SR) 150 MG 12 hr tablet Take 1 tablet (150 mg total) by mouth daily. 100 tablet 4  . cetirizine (ZYRTEC) 10 MG tablet Take 10 mg by mouth daily.    . Cholecalciferol (VITAMIN D3) 400 UNITS CAPS Take by mouth daily.      . fluticasone (FLONASE) 50 MCG/ACT nasal spray Place 2 sprays into both nostrils daily. 32 g 6  . fluticasone (FLOVENT HFA) 110 MCG/ACT inhaler Inhale 2 puffs into the lungs daily. 3 Inhaler 12  . furosemide (LASIX) 20 MG tablet 1 tablet my mouth every other day.    . montelukast (SINGULAIR) 10 MG  tablet TAKE 1 TABLET BY MOUTH ONCE DAILY 90 tablet 4  . Omega-3 Fatty Acids (FISH OIL) 1000 MG CAPS Take 1,000 mg by mouth daily.    Marland Kitchen omeprazole (PRILOSEC OTC) 20 MG tablet Take 1 tablet (20 mg total) by mouth daily. 100 tablet 4  . PROAIR HFA 108 (90 BASE) MCG/ACT inhaler INHALE 2 PUFFS INTO THE LUNGS EVERY 6 HOURS AS NEEDED FOR WHEEZING 8.5 g 0  . QVAR 80 MCG/ACT inhaler USE 1 PUFF BY MOUTH TWICE DAILY (Patient not taking: Reported on 03/06/2015) 26.1 g 3  . tamsulosin (FLOMAX) 0.4 MG CAPS capsule TAKE ONE CAPSULE BY MOUTH DAILY 100 capsule 4  . warfarin (COUMADIN) 5 MG tablet TAKE AS DIRECTED PER COUMADIN CLINIC. (Patient taking differently: TAKE AS DIRECTED PER COUMADIN CLINIC. 5 mg  on Sun, Tues, and Thurs.  2.5 mg all other days) 90 tablet 1   No current facility-administered medications on file prior to visit.   No Known Allergies  Assessment/Plan:  1. Atrial fibrillation - EKG reviewed by Dr. Lovena Le and ok for admit. Electrolytes acceptable with K >4 and Mg > 2. CrCl 49 mL/min, anticipated starting dose of Tikosyn is 250 mcg BID. Pt aware to report to admitting.   Megan E. Supple, PharmD, [place] [id number] [place], [place] [address] Phone: [phone]; Fax: [phone] 03/11/2015 10:15 AM

## 2015-03-11 NOTE — Progress Notes (Signed)
ANTICOAGULATION CONSULT NOTE - Initial Consult  Pharmacy Consult for Coumadin Indication: atrial fibrillation  No Known Allergies  Patient Measurements:    Vital Signs: Temp: 97.5 F (36.4 C) (01/31 1253) Temp Source: Oral (01/31 1253) BP: 144/49 mmHg (01/31 1253) Pulse Rate: 58 (01/31 1253)  Labs:  Recent Labs  03/11/15 0917 03/11/15 1054  INR 2.6  --   CREATININE  --  1.23    Estimated Creatinine Clearance: 47 mL/min (by C-G formula based on Cr of 1.23).   Medical History: Past Medical History  Diagnosis Date  . CAD (coronary artery disease)   . Sinus bradycardia   . Anxiety   . Asthma   . Atrial fibrillation ([place])   . GERD (gastroesophageal reflux disease) 12/30/2000  . Hyperlipidemia   . Hypertension   . Allergic rhinitis   . Diverticulosis 03/16/1995  . Hiatal hernia 12/30/2000  . History of duodenal ulcer 10/15/1986  . Heart attack ([place]) 1984  . History of blood transfusion 1988    "related to GI bleeding OR"  . DJD (degenerative joint disease)     "most joints" (03/11/2015)  . Depression     Medications:  Prescriptions prior to admission  Medication Sig Dispense Refill Last Dose  . atenolol (TENORMIN) 25 MG tablet Take 1 tablet (25 mg total) by mouth daily. (Patient taking differently: Take 25-50 mg by mouth every evening. If pt wakes up in A-fib he takes an additional 25 mg) 100 tablet 4 Taking  . atorvastatin (LIPITOR) 20 MG tablet Take 1 tablet (20 mg total) by mouth daily. 90 tablet 3 Taking  . buPROPion (WELLBUTRIN SR) 150 MG 12 hr tablet Take 1 tablet (150 mg total) by mouth daily. 100 tablet 4 Taking  . cetirizine (ZYRTEC) 10 MG tablet Take 10 mg by mouth daily.   Taking  . Cholecalciferol (VITAMIN D3) 400 UNITS CAPS Take by mouth daily.     Taking  . fluticasone (FLONASE) 50 MCG/ACT nasal spray Place 2 sprays into both nostrils daily. 32 g 6 Taking  . fluticasone (FLOVENT HFA) 110 MCG/ACT inhaler Inhale 2 puffs into the lungs daily. 3 Inhaler 12  Taking  . furosemide (LASIX) 20 MG tablet 1 tablet my mouth every other day.   Taking  . montelukast (SINGULAIR) 10 MG tablet TAKE 1 TABLET BY MOUTH ONCE DAILY 90 tablet 4 Taking  . Omega-3 Fatty Acids (FISH OIL) 1000 MG CAPS Take 1,000 mg by mouth daily.   Taking  . omeprazole (PRILOSEC OTC) 20 MG tablet Take 1 tablet (20 mg total) by mouth daily. 100 tablet 4 Taking  . PROAIR HFA 108 (90 BASE) MCG/ACT inhaler INHALE 2 PUFFS INTO THE LUNGS EVERY 6 HOURS AS NEEDED FOR WHEEZING 8.5 g 0 Taking  . tamsulosin (FLOMAX) 0.4 MG CAPS capsule TAKE ONE CAPSULE BY MOUTH DAILY 100 capsule 4 Taking  . warfarin (COUMADIN) 5 MG tablet TAKE AS DIRECTED PER COUMADIN CLINIC. (Patient taking differently: TAKE AS DIRECTED PER COUMADIN CLINIC. 5 mg on Sun, Tues, and Thurs.  2.5 mg all other days) 90 tablet 1 Taking  . QVAR 80 MCG/ACT inhaler USE 1 PUFF BY MOUTH TWICE DAILY (Patient not taking: Reported on 03/06/2015) 26.1 g 3 Not Taking at Unknown time    Assessment: 82 y/o M on Coumadin PTA presents for Tikosyn initiation.  Anticoagulation: PAF on Coumadin PtA. INR 2.6. CBC WNL - Home dose: 2.5mg  MWFSat, 5mg  TThSun  Goal of Therapy:  INR 2-3 Monitor platelets by anticoagulation protocol: Yes  Plan:  Continue home dose: 2.5mg  MWFSat, 5mg  TThSun Daily INR  Graden Hoshino S. Alford Highland, PharmD, BCPS Clinical Staff Pharmacist Pager [phone]  Eilene Ghazi Stillinger 03/11/2015,3:19 PM

## 2015-03-11 NOTE — Progress Notes (Signed)
UR Completed. Koleton Duchemin, RN, BSN.  [phone] 

## 2015-03-11 NOTE — Discharge Instructions (Addendum)
Information on my medicine - Coumadin   (Warfarin)  This medication education was reviewed with me or my healthcare representative as part of my discharge preparation.  The pharmacist that spoke with me during my hospital stay was:  Wayland Salinas, [facility]  Why was Coumadin prescribed for you? Coumadin was prescribed for you because you have a blood clot or a medical condition that can cause an increased risk of forming blood clots. Blood clots can cause serious health problems by blocking the flow of blood to the heart, lung, or brain. Coumadin can prevent harmful blood clots from forming. As a reminder your indication for Coumadin is:   Stroke Prevention Because Of Atrial Fibrillation  What test will check on my response to Coumadin? While on Coumadin (warfarin) you will need to have an INR test regularly to ensure that your dose is keeping you in the desired range. The INR (international normalized ratio) number is calculated from the result of the laboratory test called prothrombin time (PT).  If an INR APPOINTMENT HAS NOT ALREADY BEEN MADE FOR YOU please schedule an appointment to have this lab work done by your health care provider within 7 days. Your INR goal is usually a number between:  2 to 3 or your provider may give you a more narrow range like 2-2.5.  Ask your health care provider during an office visit what your goal INR is.  What  do you need to  know  About  COUMADIN? Take Coumadin (warfarin) exactly as prescribed by your healthcare provider about the same time each day.  DO NOT stop taking without talking to the doctor who prescribed the medication.  Stopping without other blood clot prevention medication to take the place of Coumadin may increase your risk of developing a new clot or stroke.  Get refills before you run out.  What do you do if you miss a dose? If you miss a dose, take it as soon as you remember on the same day then continue your regularly scheduled  regimen the next day.  Do not take two doses of Coumadin at the same time.  Important Safety Information A possible side effect of Coumadin (Warfarin) is an increased risk of bleeding. You should call your healthcare provider right away if you experience any of the following: ? Bleeding from an injury or your nose that does not stop. ? Unusual colored urine (red or dark brown) or unusual colored stools (red or black). ? Unusual bruising for unknown reasons. ? A serious fall or if you hit your head (even if there is no bleeding).  Some foods or medicines interact with Coumadin (warfarin) and might alter your response to warfarin. To help avoid this: ? Eat a balanced diet, maintaining a consistent amount of Vitamin K. ? Notify your provider about major diet changes you plan to make. ? Avoid alcohol or limit your intake to 1 drink for women and 2 drinks for men per day. (1 drink is 5 oz. wine, 12 oz. beer, or 1.5 oz. liquor.)  Make sure that ANY health care provider who prescribes medication for you knows that you are taking Coumadin (warfarin).  Also make sure the healthcare provider who is monitoring your Coumadin knows when you have started a new medication including herbals and non-prescription products.  Coumadin (Warfarin)  Major Drug Interactions  Increased Warfarin Effect Decreased Warfarin Effect  Alcohol (large quantities) Antibiotics (esp. Septra/Bactrim, Flagyl, Cipro) Amiodarone (Cordarone) Aspirin (ASA) Cimetidine (Tagamet) Megestrol (Megace) NSAIDs (ibuprofen,  naproxen, etc.) Piroxicam (Feldene) Propafenone (Rythmol SR) Propranolol (Inderal) Isoniazid (INH) Posaconazole (Noxafil) Barbiturates (Phenobarbital) Carbamazepine (Tegretol) Chlordiazepoxide (Librium) Cholestyramine (Questran) Griseofulvin Oral Contraceptives Rifampin Sucralfate (Carafate) Vitamin K   Coumadin (Warfarin) Major Herbal Interactions  Increased Warfarin Effect Decreased Warfarin Effect    Garlic Ginseng Ginkgo biloba Coenzyme Q10 Green tea St. Johns wort    Coumadin (Warfarin) FOOD Interactions  Eat a consistent number of servings per week of foods HIGH in Vitamin K (1 serving =  cup)  Collards (cooked, or boiled & drained) Kale (cooked, or boiled & drained) Mustard greens (cooked, or boiled & drained) Parsley *serving size only =  cup Spinach (cooked, or boiled & drained) Swiss chard (cooked, or boiled & drained) Turnip greens (cooked, or boiled & drained)  Eat a consistent number of servings per week of foods MEDIUM-HIGH in Vitamin K (1 serving = 1 cup)  Asparagus (cooked, or boiled & drained) Broccoli (cooked, boiled & drained, or raw & chopped) Brussel sprouts (cooked, or boiled & drained) *serving size only =  cup Lettuce, raw (green leaf, endive, romaine) Spinach, raw Turnip greens, raw & chopped   These websites have more information on Coumadin (warfarin):  FailFactory.se; VeganReport.com.au;   Atrial Fibrillation Atrial fibrillation is a type of irregular or rapid heartbeat (arrhythmia). In atrial fibrillation, the heart quivers continuously in a chaotic pattern. This occurs when parts of the heart receive disorganized signals that make the heart unable to pump blood normally. This can increase the risk for stroke, heart failure, and other heart-related conditions. There are different types of atrial fibrillation, including:  Paroxysmal atrial fibrillation. This type starts suddenly, and it usually stops on its own shortly after it starts.  Persistent atrial fibrillation. This type often lasts longer than a week. It may stop on its own or with treatment.  Long-lasting persistent atrial fibrillation. This type lasts longer than 12 months.  Permanent atrial fibrillation. This type does not go away. Talk with your health care provider to learn about the type of atrial fibrillation that you have. CAUSES This condition is caused by  some heart-related conditions or procedures, including:  A heart attack.  Coronary artery disease.  Heart failure.  Heart valve conditions.  High blood pressure.  Inflammation of the sac that surrounds the heart (pericarditis).  Heart surgery.  Certain heart rhythm disorders, such as Wolf-Parkinson-White syndrome. Other causes include:  Pneumonia.  Obstructive sleep apnea.  Blockage of an artery in the lungs (pulmonary embolism, or PE).  Lung cancer.  Chronic lung disease.  Thyroid problems, especially if the thyroid is overactive (hyperthyroidism).  Caffeine.  Excessive alcohol use or illegal drug use.  Use of some medicines, including certain decongestants and diet pills. Sometimes, the cause cannot be found. RISK FACTORS This condition is more likely to develop in:  People who are older in age.  People who smoke.  People who have diabetes mellitus.  People who are overweight (obese).  Athletes who exercise vigorously. SYMPTOMS Symptoms of this condition include:  A feeling that your heart is beating rapidly or irregularly.  A feeling of discomfort or pain in your chest.  Shortness of breath.  Sudden light-headedness or weakness.  Getting tired easily during exercise. In some cases, there are no symptoms. DIAGNOSIS Your health care provider may be able to detect atrial fibrillation when taking your pulse. If detected, this condition may be diagnosed with:  An electrocardiogram (ECG).  A Holter monitor test that records your heartbeat patterns over a 24-hour period.  Transthoracic echocardiogram (TTE) to evaluate how blood flows through your heart.  Transesophageal echocardiogram (TEE) to view more detailed images of your heart.  A stress test.  Imaging tests, such as a CT scan or chest X-ray.  Blood tests. TREATMENT The main goals of treatment are to prevent blood clots from forming and to keep your heart beating at a normal rate and  rhythm. The type of treatment that you receive depends on many factors, such as your underlying medical conditions and how you feel when you are experiencing atrial fibrillation. This condition may be treated with:  Medicine to slow down the heart rate, bring the heart's rhythm back to normal, or prevent clots from forming.  Electrical cardioversion. This is a procedure that resets your heart's rhythm by delivering a controlled, low-energy shock to the heart through your skin.  Different types of ablation, such as catheter ablation, catheter ablation with pacemaker, or surgical ablation. These procedures destroy the heart tissues that send abnormal signals. When the pacemaker is used, it is placed under your skin to help your heart beat in a regular rhythm. HOME CARE INSTRUCTIONS  Take over-the counter and prescription medicines only as told by your health care provider.  If your health care provider prescribed a blood-thinning medicine (anticoagulant), take it exactly as told. Taking too much blood-thinning medicine can cause bleeding. If you do not take enough blood-thinning medicine, you will not have the protection that you need against stroke and other problems.  Do not use tobacco products, including cigarettes, chewing tobacco, and e-cigarettes. If you need help quitting, ask your health care provider.  If you have obstructive sleep apnea, manage your condition as told by your health care provider.  Do not drink alcohol.  Do not drink beverages that contain caffeine, such as coffee, soda, and tea.  Maintain a healthy weight. Do not use diet pills unless your health care provider approves. Diet pills may make heart problems worse.  Follow diet instructions as told by your health care provider.  Exercise regularly as told by your health care provider.  Keep all follow-up visits as told by your health care provider. This is important. PREVENTION  Avoid drinking beverages that  contain caffeine or alcohol.  Avoid certain medicines, especially medicines that are used for breathing problems.  Avoid certain herbs and herbal medicines, such as those that contain ephedra or ginseng.  Do not use illegal drugs, such as cocaine and amphetamines.  Do not smoke.  Manage your high blood pressure. SEEK MEDICAL CARE IF:  You notice a change in the rate, rhythm, or strength of your heartbeat.  You are taking an anticoagulant and you notice increased bruising.  You tire more easily when you exercise or exert yourself. SEEK IMMEDIATE MEDICAL CARE IF:  You have chest pain, abdominal pain, sweating, or weakness.  You feel nauseous.  You notice blood in your vomit, bowel movement, or urine.  You have shortness of breath.  You suddenly have swollen feet and ankles.  You feel dizzy.  You have sudden weakness or numbness of the face, arm, or leg, especially on one side of the body.  You have trouble speaking, trouble understanding, or both (aphasia).  Your face or your eyelid droops on one side. These symptoms may represent a serious problem that is an emergency. Do not wait to see if the symptoms will go away. Get medical help right away. Call your local emergency services (911 in the [place] Do not drive yourself to the  hospital.   This information is not intended to replace advice given to you by your health care provider. Make sure you discuss any questions you have with your health care provider.   Document Released: 01/25/2005 Document Revised: 10/16/2014 Document Reviewed: 05/22/2014 Elsevier Interactive Patient Education 2016 [employer] Dofetilide capsules What is this medicine? DOFETILIDE (doe FET il ide) is an antiarrhythmic drug. It helps make your heart beat regularly. This medicine also helps to slow rapid heartbeats. This medicine may be used for other purposes; ask your health care provider or pharmacist if you have questions. What should I tell  my health care provider before I take this medicine? They need to know if you have any of these conditions: -heart disease -history of low levels of potassium or magnesium -kidney disease -liver disease -an unusual or allergic reaction to dofetilide, other medicines, foods, dyes, or preservatives -pregnant or trying to get pregnant -breast-feeding How should I use this medicine? Take this medicine by mouth with a glass of water. Follow the directions on the prescription label. You can take this medicine with or without food. Do not drink grapefruit juice with this medicine. Take your doses at regular intervals. Do not take your medicine more often than directed. Do not stop taking this medicine suddenly. This may cause serious, heart-related side effects. Your doctor will tell you how much medicine to take. If your doctor wants you to stop the medicine, the dose will be slowly lowered over time to avoid any side effects. Talk to your pediatrician regarding the use of this medicine in children. Special care may be needed. Overdosage: If you think you have taken too much of this medicine contact a poison control center or emergency room at once. NOTE: This medicine is only for you. Do not share this medicine with others. What if I miss a dose? If you miss a dose, take it as soon as you can. If it is almost time for your next dose, take only that dose. Do not take double or extra doses. What may interact with this medicine? Do not take this medicine with any of the following medications: -cimetidine -dolutegravir -hydrochlorothiazide alone or in combination with other medicines -ketoconazole -megestrol -other medicines that prolong the QT interval (cause an abnormal heart rhythm) -prochlorperazine -trimethoprim alone or in combination with sulfamethoxazole -verapamil This medicine may also interact with the following medications: -amiloride -certain antidepressants like fluvoxamine or  paroxetine -certain antiviral medicines for HIV or AIDS like atazanavir or darunavir -certain medicines for fungal infections like clotrimazole or miconazole -digoxin -diltiazem -dronabinol, THC -grapefruit juice -metformin -nefazodone -triamterene -zafirlukast This list may not describe all possible interactions. Give your health care provider a list of all the medicines, herbs, non-prescription drugs, or dietary supplements you use. Also tell them if you smoke, drink alcohol, or use illegal drugs. Some items may interact with your medicine. What should I watch for while using this medicine? Visit your doctor or health care professional for regular checks on your progress. Wear a medical ID bracelet or chain, and carry a card that describes your disease and details of your medicine and dosage times. Check your heart rate and blood pressure regularly while you are taking this medicine. Ask your doctor or health care professional what your heart rate and blood pressure should be, and when you should contact him or her. Your doctor or health care professional also may schedule regular tests to check your progress. You will be started on this medicine in a  specialized facility for at least three days. You will be monitored to find the right dose of medicine for you. It is very important that you take your medicine exactly as prescribed when you leave the hospital. The correct dosing of this medicine is very important to treat your condition and prevent possible serious side effects. What side effects may I notice from receiving this medicine? Side effects that you should report to your doctor or health care professional as soon as possible: -allergic reactions like skin rash, itching or hives, swelling of the face, lips, or tongue -breathing problems -dizziness -fast or rapid beating of the heart -feeling faint or lightheaded -swelling of the ankles -unusually weak or tired -vomiting Side  effects that usually do not require medical attention (report to your doctor or health care professional if they continue or are bothersome): -cough -diarrhea -difficulty sleeping -headache -nausea -stomach pain This list may not describe all possible side effects. Call your doctor for medical advice about side effects. You may report side effects to FDA at 1-800-FDA-1088. Where should I keep my medicine? Keep out of the reach of children. Store at room temperature between 15 and 30 degrees C (59 and 86 degrees F). Protect the medicine from moisture or humidity. Keep container tightly closed. Throw away any unused medicine after the expiration date. NOTE: This sheet is a summary. It may not cover all possible information. If you have questions about this medicine, talk to your doctor, pharmacist, or health care provider.    2016, Elsevier/Gold Standard. (2013-09-17 16:21:18)

## 2015-03-11 NOTE — H&P (Signed)
H&P    Patient ID: [name] Spence, MD MRN: [id number], DOB/AGE: [birth year] 80 y.o.  Admit date: 03/11/2015 Date of Consult: 03/11/2015   Primary Physician: [name], MD Primary Cardiologist: Dr. 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 Aundra Dubin arrives today for Tikosyn initiation.  The patient had his pharmacist visit today and was found in [place].  He feels well today, denies any CP, palpitations or SOB, no dizziness, near syncope or syncope.  His PMHx if of PAFib, asthma, CAD with his only PCI to the OM in 2010, HTN, HLD, GERD, allergic rhinitis that bothers hiimmostly in these winter months, and remote ulcer.  His CHADS2vasc is at least 4 an is chronically on coumadin.  He was originally planned for TEE today prior to his Tikosyn given his INR have not been completely therapeutic for 4 weeks,  but as mentioned arrived in [place].  His AFib history is as noted by his chart: Pt has a hx of PAF, dating back 20 years.Over the last few months, he has had a higher afib burden. Not a candidate for 1C agents due to h/o CAD. Recently tried multaq but felt like it exacerbated afib episodes. This was stopped. He does notice fatigue and exertional dyspnea when in afib. Had discussion with Roderic Palau for ablation vs amiodarone or tikosyn. Pt was concerned re possible side effects of amiodarone, more interested in [place]. Discussed ablation but due to age and size of left atrium(51 mm), chances of success would be only around 60-65%, with one out of 4 people needing a redo procedure. Pt planning a trip to [place] 2/6 and presents for Tikosyn initiation prior to his trip.   The patient had his visit with pharmacy, is aware of potential side effects of Tikosyn and importance of medication compliance and need to call should he miss 2 doses for guidance.  Discussed with Dr. Aundra Dubin, we are  asked to admit and intiiate Tikosyn protocol, he [name] follow along.   Past Medical History  Diagnosis Date  . CAD (coronary artery disease)   . Sinus bradycardia   . Anxiety   . Asthma   . Atrial fibrillation ([place])   . GERD (gastroesophageal reflux disease) 12/30/2000  . Hyperlipidemia   . Hypertension   . Allergic rhinitis   . Diverticulosis 03/16/1995  . Hiatal hernia 12/30/2000  . History of duodenal ulcer 10/15/1986  . Heart attack [facility]) 1984     Surgical History:  Past Surgical History  Procedure Laterality Date  . Ent surgery  1972  . Hospital ccu  1984    heart attack, hypoplastic right coronary  . Total knee replacements      bilateral  . Coronary stent placement  2010  . Cholecystectomy  1989  . Vagotomy and pyloroplasty  1985    GI bleed  . Tonsillectomy       Prescriptions prior to admission  Medication Sig Dispense Refill Last Dose  . atenolol (TENORMIN) 25 MG tablet Take 1 tablet (25 mg total) by mouth daily. (Patient taking differently: Take 25-50 mg by mouth every evening. If pt wakes up in A-fib he takes an additional 25 mg) 100 tablet 4 Taking  . atorvastatin (LIPITOR) 20 MG tablet Take 1 tablet (20 mg total) by mouth daily. 90 tablet 3 Taking  . buPROPion (WELLBUTRIN SR) 150 MG 12 hr tablet Take  1 tablet (150 mg total) by mouth daily. 100 tablet 4 Taking  . cetirizine (ZYRTEC) 10 MG tablet Take 10 mg by mouth daily.   Taking  . Cholecalciferol (VITAMIN D3) 400 UNITS CAPS Take by mouth daily.     Taking  . fluticasone (FLONASE) 50 MCG/ACT nasal spray Place 2 sprays into both nostrils daily. 32 g 6 Taking  . fluticasone (FLOVENT HFA) 110 MCG/ACT inhaler Inhale 2 puffs into the lungs daily. 3 Inhaler 12 Taking  . furosemide (LASIX) 20 MG tablet 1 tablet my mouth every other day.   Taking  . montelukast (SINGULAIR) 10 MG tablet TAKE 1 TABLET BY MOUTH ONCE DAILY 90 tablet 4 Taking  . Omega-3 Fatty Acids (FISH OIL) 1000 MG CAPS Take 1,000 mg by mouth daily.    Taking  . omeprazole (PRILOSEC OTC) 20 MG tablet Take 1 tablet (20 mg total) by mouth daily. 100 tablet 4 Taking  . PROAIR HFA 108 (90 BASE) MCG/ACT inhaler INHALE 2 PUFFS INTO THE LUNGS EVERY 6 HOURS AS NEEDED FOR WHEEZING 8.5 g 0 Taking  . tamsulosin (FLOMAX) 0.4 MG CAPS capsule TAKE ONE CAPSULE BY MOUTH DAILY 100 capsule 4 Taking  . warfarin (COUMADIN) 5 MG tablet TAKE AS DIRECTED PER COUMADIN CLINIC. (Patient taking differently: TAKE AS DIRECTED PER COUMADIN CLINIC. 5 mg on Sun, Tues, and Thurs.  2.5 mg all other days) 90 tablet 1 Taking  . QVAR 80 MCG/ACT inhaler USE 1 PUFF BY MOUTH TWICE DAILY (Patient not taking: Reported on 03/06/2015) 26.1 g 3 Not Taking at Unknown time    Inpatient Medications:   Allergies: No Known Allergies  Social History   Social History  . Marital Status: Married    Spouse Name: N/A  . Number of Children: 2  . Years of Education: N/A   Occupational History  . DOCTOR    Social History Main Topics  . Smoking status: Never Smoker   . Smokeless tobacco: Never Used  . Alcohol Use: No  . Drug Use: No  . Sexual Activity: Not on file   Other Topics Concern  . Not on file   Social History Narrative   Married. Regular exercise - yes.      Family History  Problem Relation Age of Onset  . Coronary artery disease Mother     deceased  . Deep vein thrombosis Sister   . Coronary artery disease Brother   . Asthma Brother   . Colon cancer Neg Hx      Review of Systems: All other systems reviewed and are otherwise negative except as noted above.  Physical Exam: There were no vitals filed for this visit.  GEN- The patient is well appearing, alert and oriented x 3 today.   HEENT: normocephalic, atraumatic; sclera clear, conjunctiva pink; hearing intact; oropharynx clear; neck supple, no JVP Lymph- no cervical lymphadenopathy Lungs- Clear to ausculation bilaterally, normal work of breathing.  No wheezes, rales, rhonchi Heart- Regular rate and rhythm,  no murmurs, rubs or gallops, PMI not laterally displaced GI- soft, non-tender, non-distended, bowel sounds present Extremities- no clubbing, cyanosis, or edema MS- no significant deformity or atrophy Skin- warm and dry, no rash or lesion Psych- euthymic mood, full affect Neuro- no gross deficits observed  Labs:   Lab Results  Component Value Date   WBC 10.0 03/06/2015   HGB 14.4 03/06/2015   HCT 45.5 03/06/2015   MCV 93.0 03/06/2015   PLT 268 03/06/2015    Recent Labs Lab 03/11/15 1054  NA 140  K 4.3  CL 104  CO2 29  BUN 20  CREATININE 1.23  CALCIUM 9.2  GLUCOSE 112*      Radiology/Studies: No results found.  EKG: SR, QTc is 427ms  08/14/14: Echocardiogram Study Conclusions - Left ventricle: The cavity size was normal. Wall thickness was normal. Systolic function was normal. The estimated ejection fraction was in the range of 60% to 65%. Wall motion was normal; there were no regional wall motion abnormalities. Features are consistent with a pseudonormal left ventricular filling pattern, with concomitant abnormal relaxation and increased filling pressure (grade 2 diastolic dysfunction). L wave in mitral inflow PW doppler pattern suggests elevated LV filling pressure. GLS -20.2%, normal strain pattern. - Aortic valve: There was no stenosis. There was trivial regurgitation. - Mitral valve: Mildly calcified annulus. Mildly calcified leaflets . There was mild to moderate regurgitation. - Left atrium: The atrium was moderately dilated. (LA 15mm)     - Right ventricle: The cavity size was normal. Systolic function was normal. - Right atrium: The atrium was moderately dilated. - Tricuspid valve: There was moderate regurgitation. - Pulmonary arteries: PA peak pressure: 64 mm Hg (S). - Systemic veins: IVC measured 2.2 cm with > 50% respirophasic variation, suggesting RA pressure 8 mmHg. Impressions: - Normal LV size and systolic function, EF  123456. Moderate diastolic dysfunction. Normal RV size and systolic function. Mild to moderate MR, moderate TR. There was moderate pulmonary hypertension.     Assessment and Plan:  1. Paroxysmal AFib      K+ 4.3     Mag 2.1     Creat 1.23, Cr. Clearance is 49.34, [place] Phifer start 262mcg BID     EKG done today at the office is SR, QTc 474ms, verified by Dr. Lovena Le  2. CAD     No CP     Follows with Dr. Aundra Dubin  3. HTN  4. Asthma    Signed, Tommye Standard, PA-C 03/11/2015 12:23 PM  I have seen and examined this patient with Tommye Standard.  Agree with above, note added to reflect my findings.  On exam, regular rhythm, no murmurs, lungs clear.  Presented for tikosyn loading post TEE/CV.  Was in [place] today.  Plan to start tikosyn today at 250 mcg due to low creatine clearance.  Shelden Raborn get 6 doses while in the hospital with ECG monitoring after each dose to follow the QTc.      Merlie Noga M. Jonatha Gagen MD 03/11/2015 3:32 PM

## 2015-03-11 NOTE — Progress Notes (Signed)
Pharmacy Review for Dofetilide (Tikosyn) Initiation  Admit Complaint: 82 y.o. male admitted 03/11/2015 with atrial fibrillation to be initiated on dofetilide.   Assessment:  Patient Exclusion Criteria: If any screening criteria checked as "Yes", then  patient  should NOT receive dofetilide until criteria item is corrected. If "Yes" please indicate correction plan.  YES  NO Patient  Exclusion Criteria Correction Plan  []  [x]  Baseline QTc interval is greater than or equal to 440 msec. IF above YES box checked dofetilide contraindicated unless patient has ICD; then may proceed if QTc 500-550 msec or with known ventricular conduction abnormalities may proceed with QTc 550-600 msec. QTc =  415   []  [x]  Magnesium level is less than 1.8 mEq/l : Last magnesium:  Lab Results  Component Value Date   MG 2.1 03/11/2015         []  [x]  Potassium level is less than 4 mEq/l : Last potassium:  Lab Results  Component Value Date   K 4.3 03/11/2015         []  [x]  Patient is known or suspected to have a digoxin level greater than 2 ng/ml: No results found for: DIGOXIN    []  [x]  Creatinine clearance less than 20 ml/min (calculated using Cockcroft-Gault, actual body weight and serum creatinine): Estimated Creatinine Clearance: 47 mL/min (by C-G formula based on Cr of 1.23).    []  [x]  Patient has received drugs known to prolong the QT intervals within the last 48 hours (phenothiazines, tricyclics or tetracyclic antidepressants, erythromycin, H-1 antihistamines, cisapride, fluoroquinolones, azithromycin). Drugs not listed above may have an, as yet, undetected potential to prolong the QT interval, updated information on QT prolonging agents is available at this website:QT prolonging agents   []  [x]  Patient received a dose of hydrochlorothiazide (Oretic) alone or in any combination including triamterene (Dyazide, Maxzide) in the last 48 hours.   []  [x]  Patient received a medication known to increase  dofetilide plasma concentrations prior to initial dofetilide dose:  . Trimethoprim (Primsol, Proloprim) in the last 36 hours . Verapamil (Calan, Verelan) in the last 36 hours or a sustained release dose in the last 72 hours . Megestrol (Megace) in the last 5 days  . Cimetidine (Tagamet) in the last 6 hours . Ketoconazole (Nizoral) in the last 24 hours . Itraconazole (Sporanox) in the last 48 hours  . Prochlorperazine (Compazine) in the last 36 hours    []  [x]  Patient is known to have a history of torsades de pointes; congenital or acquired long QT syndromes.   []  [x]  Patient has received a Class 1 antiarrhythmic with less than 2 half-lives since last dose. (Disopyramide, Quinidine, Procainamide, Lidocaine, Mexiletine, Flecainide, Propafenone)   []  [x]  Patient has received amiodarone therapy in the past 3 months or amiodarone level is greater than 0.3 ng/ml.    Patient has been appropriately anticoagulated with Coumadin. INR 2.6  Ordering provider was confirmed at [email or web address] if they are not listed on the [place] Prescribers list.  Goal of Therapy: Follow renal function, electrolytes, potential drug interactions, and dose adjustment. Provide education and 1 week supply at discharge.  Plan:  [x]   Physician selected initial dose within range recommended for patients level of renal function - will monitor for response.  []   Physician selected initial dose outside of range recommended for patients level of renal function - will discuss if the dose should be altered at this time.   Select One Calculated CrCl  Dose q12h  []  > 60 ml/min 500 mcg  []   40-60 ml/min 250 mcg  []  20-40 ml/min 125 mcg   2. Follow up QTc after the first 5 doses, renal function, electrolytes (K & Mg) daily x 3     days, dose adjustment, success of initiation and facilitate 1 week discharge supply as     clinically indicated.  3. Initiate Tikosyn education video (Call [phone] and ask for video #  [phone]).  4. Place Enrollment Form on the chart for discharge supply of dofetilide.  Albertina Parr, PharmD., BCPS Clinical Pharmacist Pager [phone]

## 2015-03-12 DIAGNOSIS — Z5181 Encounter for therapeutic drug level monitoring: Secondary | ICD-10-CM

## 2015-03-12 DIAGNOSIS — Z79899 Other long term (current) drug therapy: Secondary | ICD-10-CM

## 2015-03-12 LAB — BASIC METABOLIC PANEL
Anion gap: 6 (ref 5–15)
BUN: 25 mg/dL — AB (ref 6–20)
CALCIUM: 8.6 mg/dL — AB (ref 8.9–10.3)
CO2: 27 mmol/L (ref 22–32)
CREATININE: 1.38 mg/dL — AB (ref 0.61–1.24)
Chloride: 106 mmol/L (ref 101–111)
GFR calc Af Amer: 53 mL/min — ABNORMAL LOW (ref >60)
GFR, EST NON AFRICAN AMERICAN: 46 mL/min — AB (ref >60)
Glucose, Bld: 134 mg/dL — ABNORMAL HIGH (ref 65–99)
POTASSIUM: 4 mmol/L (ref 3.5–5.1)
SODIUM: 139 mmol/L (ref 135–145)

## 2015-03-12 LAB — PROTIME-INR
INR: 2.61 — ABNORMAL HIGH (ref 0.00–1.49)
Prothrombin Time: 27.6 seconds — ABNORMAL HIGH (ref 11.6–15.2)

## 2015-03-12 LAB — MAGNESIUM: MAGNESIUM: 2 mg/dL (ref 1.7–2.4)

## 2015-03-12 MED ORDER — FUROSEMIDE 20 MG PO TABS: 20.0000 mg | ORAL_TABLET | ORAL | Status: DC

## 2015-03-12 MED ORDER — WARFARIN SODIUM 5 MG PO TABS
5.0000 mg | ORAL_TABLET | ORAL | Status: AC
  Administered 2015-03-13: 5 mg via ORAL
  Filled 2015-03-12: qty 1

## 2015-03-12 MED ORDER — FUROSEMIDE 20 MG PO TABS
20.0000 mg | ORAL_TABLET | ORAL | Status: DC
  Filled 2015-03-12: qty 1

## 2015-03-12 NOTE — Care Management Note (Signed)
Case Management Note  Patient Details  Name: [name] NITCHER, MD MRN: [id number] Date of Birth: 07/01/1929  Subjective/Objective:  Pt is admitted for Tikosyn Load                  Action/Plan:  Pt is from home with wife (independent).  Pt will discharge home on Tikosyn, CM will assist with Tikosyn post discharge.   Expected Discharge Date:                  Expected Discharge Plan:  Home/Self Care  In-House Referral:     Discharge planning Services  CM Consult, Medication Assistance  Post Acute Care Choice:    Choice offered to:     DME Arranged:    DME Agency:     HH Arranged:    HH Agency:     Status of Service:  In process, will continue to follow  Medicare Important Message Given:    Date Medicare IM Given:    Medicare IM give by:    Date Additional Medicare IM Given:    Additional Medicare Important Message give by:     If discussed at [place] of Stay Meetings, dates discussed:    Additional Comments: S/W JESSICA @ [facility] # [phone]   TIKOSYN 250 MCG BID     AND   500 MCG BID   30 DAY SUPPLY   COVER- YES                     YES  CO-PAY- $ 265.0                  SAME  TIER- 4 DRUG                    SAME  PRIOR APPROVAL - NO            SAME   DOFETILIDE  250 MCG  BID    AND   500 MCG   COVER- YES                         YES  CO-PAY-  $ 265.00                     SAME  TIER- 4 DRUG                         SAME  PRIOR APPROVAL - NO                 NO   PHARMACY : [facility]   CM communicated to pt the copay for Tikosyn, pt denied hardship with copay.  CM contacted pts pharmacy of choice [facility] on [address] and was informed that pharmacy can filled prescription with next day delivery, pt informed of need to get prescription to  pharmacy immediately post discharge.  Pt informed that he will discharge home with 7 day supply.    Maryclare Labrador, RN 03/12/2015, 10:35 AM

## 2015-03-12 NOTE — Progress Notes (Signed)
EKG obtained prior to giving Tikosyn 250   Dose @ 0832am   QTC  428   [name] [name]

## 2015-03-12 NOTE — Progress Notes (Signed)
Discussed patient with Dr. Aundra Dubin, he is assuming care of the patient and will follow him/his tikosyn load.    EP service remains available if needed.  Tommye Standard, PA-C  [place] assuming care, will follow if necessary.  Allegra Lai, MD

## 2015-03-12 NOTE — Progress Notes (Signed)
ANTICOAGULATION CONSULT NOTE - Consult  Pharmacy Consult for Coumadin Indication: atrial fibrillation  No Known Allergies  Patient Measurements: Height: 5\' 10"  (177.8 cm) Weight: 169 lb 8 oz (76.885 kg) IBW/kg (Calculated) : 73  Vital Signs: Temp: 97.7 F (36.5 C) (02/01 0643) Temp Source: Oral (02/01 0643) BP: 127/50 mmHg (02/01 1017) Pulse Rate: 56 (02/01 1017)  Labs:  Recent Labs  03/11/15 0917 03/11/15 1054 03/12/15 0330  LABPROT  --   --  27.6*  INR 2.6  --  2.61*  CREATININE  --  1.23 1.38*    Estimated Creatinine Clearance: 41.9 mL/min (by C-G formula based on Cr of 1.38).  Assessment: 82 y/o M on Coumadin PTA presents for Tikosyn initiation.  Anticoagulation: PAF on Coumadin PtA. INR 2.61. CBC WNL - Home dose: 2.5mg  MWFSat, 5mg  TThSun  Goal of Therapy:  INR 2-3 Monitor platelets by anticoagulation protocol: Yes   Plan:  Continue home dose: 2.5mg  MWFSat, 5mg  TThSun Daily INR  Levester Fresh, PharmD, BCPS, [facility] Clinical Pharmacist Pager [phone] 03/12/2015 11:04 AM

## 2015-03-12 NOTE — Progress Notes (Signed)
Patient ID: [name], MD, male   DOB: 09-20-1929, 82 y.o.   MRN: [id number]   SUBJECTIVE: No complaints this morning.  Remains in NSR.  Started Tikosyn yesterday.   I reviewed ECG from last night and this morning.  QTc ok, in 430 msec range.    Scheduled Meds: . aspirin EC  81 mg Oral Daily  . atenolol  25 mg Oral Daily  . atorvastatin  20 mg Oral q1800  . budesonide  0.25 mg Nebulization BID  . buPROPion  150 mg Oral Daily  . dofetilide  250 mcg Oral BID  . furosemide  20 mg Oral QODAY  . montelukast  10 mg Oral QHS  . omega-3 acid ethyl esters  1 g Oral Daily  . pantoprazole  20 mg Oral Daily  . sodium chloride flush  3 mL Intravenous Q12H  . tamsulosin  0.4 mg Oral Daily  . warfarin  2.5 mg Oral Once per day on Mon Wed Fri Sat  . Warfarin - Pharmacist Dosing Inpatient   Does not apply q1800   Continuous Infusions:  PRN Meds:.sodium chloride, acetaminophen, albuterol, nitroGLYCERIN, ondansetron (ZOFRAN) IV, sodium chloride flush    Filed Vitals:   03/11/15 1300 03/11/15 1953 03/11/15 2119 03/12/15 0643  BP:  125/54  128/57  Pulse:  65  56  Temp:  98 F (36.7 C)  97.7 F (36.5 C)  TempSrc:  Oral  Oral  Resp:  18  18  Height: 5\' 10"  (1.778 m)     Weight: 169 lb 8 oz (76.885 kg)     SpO2:  99% 98% 97%    Intake/Output Summary (Last 24 hours) at 03/12/15 0826 Last data filed at 03/11/15 1900  Gross per 24 hour  Intake    240 ml  Output      0 ml  Net    240 ml    LABS: Basic Metabolic Panel:  Recent Labs  03/11/15 1054 03/12/15 0330  NA 140 139  K 4.3 4.0  CL 104 106  CO2 29 27  GLUCOSE 112* 134*  BUN 20 25*  CREATININE 1.23 1.38*  CALCIUM 9.2 8.6*  MG 2.1 2.0   Liver Function Tests: No results for input(s): AST, ALT, ALKPHOS, BILITOT, PROT, ALBUMIN in the last 72 hours. No results for input(s): LIPASE, AMYLASE in the last 72 hours. CBC: No results for input(s): WBC, NEUTROABS, HGB, HCT, MCV, PLT in the last 72 hours. Cardiac Enzymes: No  results for input(s): CKTOTAL, CKMB, CKMBINDEX, TROPONINI in the last 72 hours. BNP: Invalid input(s): POCBNP D-Dimer: No results for input(s): DDIMER in the last 72 hours. Hemoglobin A1C: No results for input(s): HGBA1C in the last 72 hours. Fasting Lipid Panel: No results for input(s): CHOL, HDL, LDLCALC, TRIG, CHOLHDL, LDLDIRECT in the last 72 hours. Thyroid Function Tests: No results for input(s): TSH, T4TOTAL, T3FREE, THYROIDAB in the last 72 hours.  Invalid input(s): FREET3 Anemia Panel: No results for input(s): VITAMINB12, FOLATE, FERRITIN, TIBC, IRON, RETICCTPCT in the last 72 hours.  RADIOLOGY: No results found.  PHYSICAL EXAM General: NAD Neck: JVP 7-8 cm, no thyromegaly or thyroid nodule.  Lungs: Clear to auscultation bilaterally with normal respiratory effort. CV: Nondisplaced PMI.  Heart regular S1/S2, no S3/S4, 1/6 HSM apex.  No peripheral edema.  No carotid bruit.  Normal pedal pulses.  Abdomen: Soft, nontender, no hepatosplenomegaly, no distention.  Neurologic: Alert and oriented x 3.  Psych: Normal affect. Extremities: No clubbing or cyanosis.   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 He had an RV infarct with chronic total occlusion of a small nondominant RCA in the 1980s. He had a drug-eluting stent to the CFX in 11/10. He is very symptomatic with atrial fibrillation episodes. He has had a chronic pattern of mild shortness of breath and mild left shoulder pain/chest aching when walking up a hill (x 20 years). Echo in 7/16 showed normal LV systolic function, EF [id number] with moderate diastolic dysfunction. Moderate TR with PA systolic pressure 65 mmHg. Normal RV size and systolic function.  He has been admitted for Tikosyn load.  1. Atrial fibrillation: Paroxysmal.  Remains in NSR.  Very symptomatic with  atrial fibrillation, has failed Multaq and not candidate for Ic agent.  Probably not good ablation candidate with size of LA.  He started Tikosyn pm 1/31.  ECG from last night and this morning reviewed, QTc about 430 msec.  - Continue current Tikosyn dosed for renal function.  - HR in 50s, if gets lower will decrease atenolol. - Continue coumadin.  2. CAD: Stable, no chest pain.  No ASA with coumadin.  Continue statin.  3. Chronic diastolic CHF: Stable, on Lasix every other day.   Loralie Champagne 03/12/2015 8:32 AM

## 2015-03-13 LAB — CBC
HEMATOCRIT: 37.5 % — AB (ref 39.0–52.0)
HEMOGLOBIN: 12.2 g/dL — AB (ref 13.0–17.0)
MCH: 30 pg (ref 26.0–34.0)
MCHC: 32.5 g/dL (ref 30.0–36.0)
MCV: 92.1 fL (ref 78.0–100.0)
Platelets: 196 10*3/uL (ref 150–400)
RBC: 4.07 MIL/uL — AB (ref 4.22–5.81)
RDW: 13.3 % (ref 11.5–15.5)
WBC: 8 10*3/uL (ref 4.0–10.5)

## 2015-03-13 LAB — BASIC METABOLIC PANEL
ANION GAP: 9 (ref 5–15)
BUN: 27 mg/dL — ABNORMAL HIGH (ref 6–20)
CALCIUM: 8.7 mg/dL — AB (ref 8.9–10.3)
CO2: 26 mmol/L (ref 22–32)
Chloride: 103 mmol/L (ref 101–111)
Creatinine, Ser: 1.16 mg/dL (ref 0.61–1.24)
GFR calc Af Amer: >60 mL/min (ref >60)
GFR calc non Af Amer: 56 mL/min — ABNORMAL LOW (ref >60)
GLUCOSE: 110 mg/dL — AB (ref 65–99)
Potassium: 4.2 mmol/L (ref 3.5–5.1)
Sodium: 138 mmol/L (ref 135–145)

## 2015-03-13 LAB — MAGNESIUM: Magnesium: 2.1 mg/dL (ref 1.7–2.4)

## 2015-03-13 LAB — PROTIME-INR
INR: 2.47 — ABNORMAL HIGH (ref 0.00–1.49)
Prothrombin Time: 26.4 seconds — ABNORMAL HIGH (ref 11.6–15.2)

## 2015-03-13 NOTE — Care Management Note (Addendum)
Case Management Note  Patient Details  Name: [name] BOU, MD MRN: [id number] Date of Birth: 12-Sep-1929  Subjective/Objective:  Pt is admitted for Tikosyn Load                  Action/Plan:  Pt is from home with wife (independent).  Pt will discharge home on Tikosyn, CM will assist with Tikosyn post discharge.   Expected Discharge Date:                  Expected Discharge Plan:  Home/Self Care  In-House Referral:     Discharge planning Services  CM Consult, Medication Assistance  Post Acute Care Choice:    Choice offered to:     DME Arranged:    DME Agency:     HH Arranged:    HH Agency:     Status of Service:  In process, will continue to follow  Medicare Important Message Given:    Date Medicare IM Given:    Medicare IM give by:    Date Additional Medicare IM Given:    Additional Medicare Important Message give by:     If discussed at [place] of Stay Meetings, dates discussed:    Additional Comments: S/W JESSICA @ [facility] # [phone]   TIKOSYN 250 MCG BID     AND   500 MCG BID   30 DAY SUPPLY   COVER- YES                     YES  CO-PAY- $ 265.0                  SAME  TIER- 4 DRUG                    SAME  PRIOR APPROVAL - NO            SAME   DOFETILIDE  250 MCG  BID    AND   500 MCG   COVER- YES                         YES  CO-PAY-  $ 265.00                     SAME  TIER- 4 DRUG                         SAME  PRIOR APPROVAL - NO                 NO   PH[facility]Y : [facility]   Generic TIkosyn Benefit check yielded:   Pt copay will be $265- deductible of $170 has not been met yet. After deductible has been met pt copay will be $95 .  MD made aware  CM communicated to pt the copay for Tikosyn, pt denied hardship with copay.  CM  contacted pts pharmacy of choice [facility] on [address] and was informed that pharmacy can filled prescription within 3 days of receiving prescription, pt informed of need to get prescription to pharmacy immediately post discharge.  Pt informed that he will discharge home with 7 day supply.  CM provided attending sticky note requesting that prescription be faxed to [facility] on [address] per pts request.  Maryclare Labrador, RN 03/13/2015, 10:59 AM

## 2015-03-13 NOTE — Progress Notes (Signed)
Patient ID: [name] Spence, MD, male   DOB: 1929/04/01, 82 y.o.   MRN: [id number]   SUBJECTIVE: No complaints this morning.  Remains in NSR.  Continues on Tikosyn.   I reviewed ECG from last night, QTc 425 msec.    Scheduled Meds: . atenolol  25 mg Oral Daily  . atorvastatin  20 mg Oral q1800  . budesonide  0.25 mg Nebulization BID  . buPROPion  150 mg Oral Daily  . dofetilide  250 mcg Oral BID  . furosemide  20 mg Oral QODAY  . montelukast  10 mg Oral QHS  . omega-3 acid ethyl esters  1 g Oral Daily  . pantoprazole  20 mg Oral Daily  . sodium chloride flush  3 mL Intravenous Q12H  . tamsulosin  0.4 mg Oral Daily  . warfarin  2.5 mg Oral Once per day on Mon Wed Fri Sat  . warfarin  5 mg Oral Once per day on Sun Tue Thu  . Warfarin - Pharmacist Dosing Inpatient   Does not apply q1800   Continuous Infusions:  PRN Meds:.sodium chloride, acetaminophen, albuterol, nitroGLYCERIN, ondansetron (ZOFRAN) IV, sodium chloride flush    Filed Vitals:   03/12/15 1417 03/12/15 1947 03/12/15 2052 03/13/15 0537  BP: 119/58  136/54 110/44  Pulse: 56  58 90  Temp: 97.5 F (36.4 C)  98.1 F (36.7 C) 98.3 F (36.8 C)  TempSrc: Oral  Oral Oral  Resp: 18  16 2   Height:      Weight:      SpO2: 98% 97% 97% 94%    Intake/Output Summary (Last 24 hours) at 03/13/15 0741 Last data filed at 03/12/15 1900  Gross per 24 hour  Intake    840 ml  Output      0 ml  Net    840 ml    LABS: Basic Metabolic Panel:  Recent Labs  03/12/15 0330 03/13/15 0400  NA 139 138  K 4.0 4.2  CL 106 103  CO2 27 26  GLUCOSE 134* 110*  BUN 25* 27*  CREATININE 1.38* 1.16  CALCIUM 8.6* 8.7*  MG 2.0 2.1   Liver Function Tests: No results for input(s): AST, ALT, ALKPHOS, BILITOT, PROT, ALBUMIN in the last 72 hours. No results for input(s): LIPASE, AMYLASE in the last 72 hours. CBC:  Recent Labs  03/13/15 0400  WBC 8.0  HGB 12.2*  HCT 37.5*  MCV 92.1  PLT 196   Cardiac Enzymes: No results for  input(s): CKTOTAL, CKMB, CKMBINDEX, TROPONINI in the last 72 hours. BNP: Invalid input(s): POCBNP D-Dimer: No results for input(s): DDIMER in the last 72 hours. Hemoglobin A1C: No results for input(s): HGBA1C in the last 72 hours. Fasting Lipid Panel: No results for input(s): CHOL, HDL, LDLCALC, TRIG, CHOLHDL, LDLDIRECT in the last 72 hours. Thyroid Function Tests: No results for input(s): TSH, T4TOTAL, T3FREE, THYROIDAB in the last 72 hours.  Invalid input(s): FREET3 Anemia Panel: No results for input(s): VITAMINB12, FOLATE, FERRITIN, TIBC, IRON, RETICCTPCT in the last 72 hours.  RADIOLOGY: No results found.  PHYSICAL EXAM General: NAD Neck: JVP 7 cm, no thyromegaly or thyroid nodule.  Lungs: Clear to auscultation bilaterally with normal respiratory effort. CV: Nondisplaced PMI.  Heart regular S1/S2, no S3/S4, 1/6 HSM apex.  No peripheral edema.  No carotid bruit.  Normal pedal pulses.  Abdomen: Soft, nontender, no hepatosplenomegaly, no distention.  Neurologic: Alert and oriented x 3.  Psych: Normal affect. Extremities: No clubbing or cyanosis.   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 He had an RV infarct with chronic total occlusion of a small nondominant RCA in the 1980s. He had a drug-eluting stent to the CFX in 11/10. He is very symptomatic with atrial fibrillation episodes. He has had a chronic pattern of mild shortness of breath and mild left shoulder pain/chest aching when walking up a hill (x 20 years). Echo in 7/16 showed normal LV systolic function, EF 123456 with moderate diastolic dysfunction. Moderate TR with PA systolic pressure 65 mmHg. Normal RV size and systolic function.  He has been admitted for Tikosyn load.  1. Atrial fibrillation: Paroxysmal.  Remains in NSR.  Very symptomatic with atrial  fibrillation, has failed Multaq and not candidate for Ic agent.  Probably not good ablation candidate with size of LA.  He started Tikosyn pm 1/31.  ECG from last night reviewed, QTc about 425 msec.  - Continue current Tikosyn dosed for renal function.  - HR in 50s, keep atenolol at lower dose 25 mg daily rather than bid.  - Continue coumadin.  2. CAD: Stable, no chest pain.  No ASA with coumadin.  Continue statin.  3. Chronic diastolic CHF: Stable, on Lasix every other day.   Loralie Champagne 03/13/2015 7:41 AM

## 2015-03-13 NOTE — Progress Notes (Signed)
Pt had a 3 beat run of vtach. Pt asymptomatic. MD notified. Will continue to monitor.

## 2015-03-13 NOTE — Progress Notes (Signed)
ANTICOAGULATION CONSULT NOTE - Follow Up Consult  Pharmacy Consult for Coumadin Indication: atrial fibrillation  No Known Allergies  Patient Measurements: Height: 5\' 10"  (177.8 cm) Weight: 169 lb 8 oz (76.885 kg) IBW/kg (Calculated) : 73 Heparin Dosing Weight:   Vital Signs: Temp: 98.3 F (36.8 C) (02/02 0537) Temp Source: Oral (02/02 0537) BP: 110/44 mmHg (02/02 0537) Pulse Rate: 90 (02/02 0537)  Labs:  Recent Labs  03/11/15 0917 03/11/15 1054 03/12/15 0330 03/13/15 0400  HGB  --   --   --  12.2*  HCT  --   --   --  37.5*  PLT  --   --   --  196  LABPROT  --   --  27.6* 26.4*  INR 2.6  --  2.61* 2.47*  CREATININE  --  1.23 1.38* 1.16    Estimated Creatinine Clearance: 49.8 mL/min (by C-G formula based on Cr of 1.16).   Medications:  Scheduled:  . atenolol  25 mg Oral Daily  . atorvastatin  20 mg Oral q1800  . budesonide  0.25 mg Nebulization BID  . buPROPion  150 mg Oral Daily  . dofetilide  250 mcg Oral BID  . furosemide  20 mg Oral QODAY  . montelukast  10 mg Oral QHS  . omega-3 acid ethyl esters  1 g Oral Daily  . pantoprazole  20 mg Oral Daily  . sodium chloride flush  3 mL Intravenous Q12H  . tamsulosin  0.4 mg Oral Daily  . warfarin  2.5 mg Oral Once per day on Mon Wed Fri Sat  . warfarin  5 mg Oral Once per day on Sun Tue Thu  . Warfarin - Pharmacist Dosing Inpatient   Does not apply q1800    Assessment: 85yom on Coumadin pta for AFib, admitted for Tikosyn loading.  INR remains therapeutic on home dose of 2.5mg  daily x 5mg  TThSun.  No bleeding noted.  CBC stable.  Goal of Therapy:  INR 2-3 Monitor platelets by anticoagulation protocol: Yes   Plan:  Continue Coumadin Daily INR Watch for s/s of bleeding  Gracy Bruins, PharmD [facility]

## 2015-03-14 LAB — BASIC METABOLIC PANEL
ANION GAP: 8 (ref 5–15)
BUN: 22 mg/dL — ABNORMAL HIGH (ref 6–20)
CALCIUM: 8.9 mg/dL (ref 8.9–10.3)
CO2: 27 mmol/L (ref 22–32)
Chloride: 103 mmol/L (ref 101–111)
Creatinine, Ser: 1.15 mg/dL (ref 0.61–1.24)
GFR, EST NON AFRICAN AMERICAN: 57 mL/min — AB (ref >60)
GLUCOSE: 96 mg/dL (ref 65–99)
Potassium: 4.3 mmol/L (ref 3.5–5.1)
SODIUM: 138 mmol/L (ref 135–145)

## 2015-03-14 LAB — MAGNESIUM: MAGNESIUM: 2.1 mg/dL (ref 1.7–2.4)

## 2015-03-14 LAB — CBC
HCT: 39.1 % (ref 39.0–52.0)
Hemoglobin: 12.6 g/dL — ABNORMAL LOW (ref 13.0–17.0)
MCH: 29.7 pg (ref 26.0–34.0)
MCHC: 32.2 g/dL (ref 30.0–36.0)
MCV: 92.2 fL (ref 78.0–100.0)
PLATELETS: 202 10*3/uL (ref 150–400)
RBC: 4.24 MIL/uL (ref 4.22–5.81)
RDW: 13.4 % (ref 11.5–15.5)
WBC: 9.2 10*3/uL (ref 4.0–10.5)

## 2015-03-14 LAB — PROTIME-INR
INR: 2.11 — ABNORMAL HIGH (ref 0.00–1.49)
PROTHROMBIN TIME: 23.5 s — AB (ref 11.6–15.2)

## 2015-03-14 MED ORDER — ATENOLOL 25 MG PO TABS: 25.0000 mg | ORAL_TABLET | Freq: Every evening | ORAL | Status: DC

## 2015-03-14 MED ORDER — NITROGLYCERIN 0.4 MG SL SUBL: 0.4000 mg | SUBLINGUAL_TABLET | SUBLINGUAL | Status: DC | PRN

## 2015-03-14 MED ORDER — ACETAMINOPHEN 325 MG PO TABS: 650.0000 mg | ORAL_TABLET | ORAL | Status: DC | PRN

## 2015-03-14 MED ORDER — DOFETILIDE 250 MCG PO CAPS: 250.0000 ug | ORAL_CAPSULE | Freq: Two times a day (BID) | ORAL | Status: DC

## 2015-03-14 MED ORDER — WARFARIN SODIUM 5 MG PO TABS: ORAL_TABLET | ORAL | Status: DC

## 2015-03-14 MED ORDER — FLUTICASONE PROPIONATE HFA 110 MCG/ACT IN AERO: 1.0000 | INHALATION_SPRAY | Freq: Two times a day (BID) | RESPIRATORY_TRACT | Status: DC

## 2015-03-14 NOTE — Discharge Summary (Signed)
CARDIOLOGY DISCHARGE SUMMARY   Patient ID: [name] [name], [name] [name] MRN: [id number] DOB/AGE: 1929-10-04 82 y.o.  Admit date: 03/11/2015 Discharge date: 03/14/2015  PCP: [name], [name] [name] Primary Cardiologist: Dr. Aundra [name] Electrophysiologist: Atrial fibrillation clinic  Primary Discharge Diagnosis:  Paroxysmal atrial fibrillation Secondary Discharge Diagnosis:    Hyperlipidemia   Essential hypertension   CAD S/P OM PCI 2010   GERD   Long term current use of Coumadin - CHADS2VASC=4  Procedures: Serial ECGs  Hospital Course: [name] [name], [name] [name] is a 82 y.o. male with a history of PAFib on Coumadin, asthma, CAD with his only PCI to the OM in 2010, HTN, HLD, GERD, allergic rhinitis that bothers him mostly in these winter months, and remote ulcer disease.  He has had an increased atrial fibrillation burden recently but was not a candidate for 1C agents due to history of CAD, and Multaq was not effective. The decision was made to try Tikosyn and he was admitted to the hospital on 03/11/2015 for Tikosyn loading.  Dr. Caryn Section was in sinus rhythm on admission and remained in sinus rhythm during his stay. He tolerated the Tikosyn well. His dose based on pharmacy calculations was 250 g every 12 hours and he took this consistently. He was on his home dose of other medications and had no complications. His Coumadin remained therapeutic. His magnesium and potassium were followed as were his QT/QTC intervals.  On 03/14/2015, he was seen by Dr. Aundra [name] and all data were reviewed. His atenolol dose is being reduced because of bradycardia. No further inpatient workup was indicated and he was considered stable for discharge, with early outpatient follow-up arranged.  Labs:   Lab Results  Component Value Date   WBC 9.2 03/14/2015   HGB 12.6* 03/14/2015   HCT 39.1 03/14/2015   MCV 92.2 03/14/2015   PLT 202 03/14/2015     Recent Labs Lab 03/14/15 0340  NA 138  K 4.3  CL 103  CO2 27  BUN 22*    CREATININE 1.15  CALCIUM 8.9  GLUCOSE 96   MAGNESIUM  Date Value Ref Range Status  03/14/2015 2.1 1.7 - 2.4 mg/dL Final    Recent Labs  03/14/15 0340  INR 2.11*    EKG: 03/14/2015 Sinus bradycardia Vent. rate 56 BPM PR interval 164 ms QRS duration 78 ms QT/QTc 444/428 ms P-R-T axes 77 56 -41  FOLLOW UP PLANS AND APPOINTMENTS No Known Allergies   Medication List    STOP taking these medications        QVAR 80 MCG/ACT inhaler  Generic drug:  beclomethasone      TAKE these medications        acetaminophen 325 MG tablet  Commonly known as:  TYLENOL  Take 2 tablets (650 mg total) by mouth every 4 (four) hours as needed for headache or mild pain.     atenolol 25 MG tablet  Commonly known as:  TENORMIN  Take 1-2 tablets (25-50 mg total) by mouth every evening. If pt wakes up in A-fib he takes an additional 25 mg     atorvastatin 20 MG tablet  Commonly known as:  LIPITOR  Take 1 tablet (20 mg total) by mouth daily.     buPROPion 150 MG 12 hr tablet  Commonly known as:  WELLBUTRIN SR  Take 1 tablet (150 mg total) by mouth daily.     cetirizine 10 MG tablet  Commonly known as:  ZYRTEC  Take 10 mg by mouth daily.  dofetilide 250 MCG capsule  Commonly known as:  TIKOSYN  Take 1 capsule (250 mcg total) by mouth 2 (two) times daily.     Fish Oil 1000 MG Caps  Take 1,000 mg by mouth daily.     fluticasone 110 MCG/ACT inhaler  Commonly known as:  FLOVENT HFA  Inhale 1 puff into the lungs 2 (two) times daily.     fluticasone 50 MCG/ACT nasal spray  Commonly known as:  FLONASE  Place 2 sprays into both nostrils daily.     LASIX 20 MG tablet  Generic drug:  furosemide  1 tablet my mouth every other day.     montelukast 10 MG tablet  Commonly known as:  SINGULAIR  TAKE 1 TABLET BY MOUTH ONCE DAILY     nitroGLYCERIN 0.4 MG SL tablet  Commonly known as:  NITROSTAT  Place 1 tablet (0.4 mg total) under the tongue every 5 (five) minutes x 3 doses as needed  for chest pain.     omeprazole 20 MG tablet  Commonly known as:  PRILOSEC OTC  Take 1 tablet (20 mg total) by mouth daily.     PROAIR HFA 108 (90 Base) MCG/ACT inhaler  Generic drug:  albuterol  INHALE 2 PUFFS INTO THE LUNGS EVERY 6 HOURS AS NEEDED FOR WHEEZING     tamsulosin 0.4 MG Caps capsule  Commonly known as:  FLOMAX  TAKE ONE CAPSULE BY MOUTH DAILY     Vitamin D3 400 units Caps  Take by mouth daily.     warfarin 5 MG tablet  Commonly known as:  COUMADIN  TAKE AS DIRECTED PER COUMADIN CLINIC. 5 mg on Sun, Tues, and Thurs.  2.5 mg all other days         Follow-up Information    Follow up with CARROLL,DONNA, NP.   Specialties:  Nurse Practitioner, Cardiology   Why:  office will contact you   Contact information:   [place] [address] [phone]       Follow up with Loralie Champagne, [name] [name] On 04/14/2015.   Specialty:  Cardiology   Why:  1:15 PM   Contact information:   [id number] [place] [address] [phone]       Follow up with [facility] Office On 04/21/2015.   Specialty:  Cardiology   Why:  [facility]-8:30 am   Contact information:   [address], [place] [phone]      BRING ALL MEDICATIONS WITH YOU TO FOLLOW UP APPOINTMENTS  Time spent with patient to include physician time: 33 min Signed: Rosaria Ferries, [name] [name] 03/14/2015, 11:33 AM Co-Sign [name] [name]

## 2015-03-14 NOTE — Progress Notes (Signed)
Patient ID: [name] Spence, MD, male   DOB: 1929/12/16, 82 y.o.   MRN: [id number]   SUBJECTIVE: No complaints this morning.  Remains in NSR.  Continues on Tikosyn.   I reviewed ECG from last night, QTc 430 msec.    Scheduled Meds: . atenolol  25 mg Oral Daily  . atorvastatin  20 mg Oral q1800  . budesonide  0.25 mg Nebulization BID  . buPROPion  150 mg Oral Daily  . dofetilide  250 mcg Oral BID  . furosemide  20 mg Oral QODAY  . montelukast  10 mg Oral QHS  . omega-3 acid ethyl esters  1 g Oral Daily  . pantoprazole  20 mg Oral Daily  . sodium chloride flush  3 mL Intravenous Q12H  . tamsulosin  0.4 mg Oral Daily  . warfarin  2.5 mg Oral Once per day on Mon Wed Fri Sat  . Warfarin - Pharmacist Dosing Inpatient   Does not apply q1800   Continuous Infusions:  PRN Meds:.sodium chloride, acetaminophen, albuterol, nitroGLYCERIN, ondansetron (ZOFRAN) IV, sodium chloride flush    Filed Vitals:   03/13/15 1400 03/13/15 1950 03/13/15 2100 03/14/15 0414  BP: 114/46 117/49  116/53  Pulse: 55 57  59  Temp: 97.3 F (36.3 C) 98 F (36.7 C)  97.5 F (36.4 C)  TempSrc: Oral Oral  Oral  Resp:  18  18  Height:      Weight:      SpO2: 94% 98% 99% 97%    Intake/Output Summary (Last 24 hours) at 03/14/15 0852 Last data filed at 03/14/15 [id number]  Gross per 24 hour  Intake    480 ml  Output      0 ml  Net    480 ml    LABS: Basic Metabolic Panel:  Recent Labs  03/13/15 0400 03/14/15 0340  NA 138 138  K 4.2 4.3  CL 103 103  CO2 26 27  GLUCOSE 110* 96  BUN 27* 22*  CREATININE 1.16 1.15  CALCIUM 8.7* 8.9  MG 2.1 2.1   Liver Function Tests: No results for input(s): AST, ALT, ALKPHOS, BILITOT, PROT, ALBUMIN in the last 72 hours. No results for input(s): LIPASE, AMYLASE in the last 72 hours. CBC:  Recent Labs  03/13/15 0400 03/14/15 0340  WBC 8.0 9.2  HGB 12.2* 12.6*  HCT 37.5* 39.1  MCV 92.1 92.2  PLT 196 202   Cardiac Enzymes: No results for input(s): CKTOTAL, CKMB,  CKMBINDEX, TROPONINI in the last 72 hours. BNP: Invalid input(s): POCBNP D-Dimer: No results for input(s): DDIMER in the last 72 hours. Hemoglobin A1C: No results for input(s): HGBA1C in the last 72 hours. Fasting Lipid Panel: No results for input(s): CHOL, HDL, LDLCALC, TRIG, CHOLHDL, LDLDIRECT in the last 72 hours. Thyroid Function Tests: No results for input(s): TSH, T4TOTAL, T3FREE, THYROIDAB in the last 72 hours.  Invalid input(s): FREET3 Anemia Panel: No results for input(s): VITAMINB12, FOLATE, FERRITIN, TIBC, IRON, RETICCTPCT in the last 72 hours.  RADIOLOGY: No results found.  PHYSICAL EXAM General: NAD Neck: JVP 7 cm, no thyromegaly or thyroid nodule.  Lungs: Clear to auscultation bilaterally with normal respiratory effort. CV: Nondisplaced PMI.  Heart regular S1/S2, no S3/S4, 1/6 HSM apex.  No peripheral edema.  No carotid bruit.  Normal pedal pulses.  Abdomen: Soft, nontender, no hepatosplenomegaly, no distention.  Neurologic: Alert and oriented x 3.  Psych: Normal affect. Extremities: No clubbing or cyanosis.   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 He had an RV infarct with chronic total occlusion of a small nondominant RCA in the 1980s. He had a drug-eluting stent to the CFX in 11/10. He is very symptomatic with atrial fibrillation episodes. He has had a chronic pattern of mild shortness of breath and mild left shoulder pain/chest aching when walking up a hill (x 20 years). Echo in 7/16 showed normal LV systolic function, EF 123456 with moderate diastolic dysfunction. Moderate TR with PA systolic pressure 65 mmHg. Normal RV size and systolic function.  He has been admitted for Tikosyn load.  1. Atrial fibrillation: Paroxysmal.  Remains in NSR.  Very symptomatic with atrial fibrillation, has failed Multaq  and not candidate for Ic agent.  Probably not good ablation candidate with size of LA.  He started Tikosyn pm 1/31.  ECG from last night reviewed, QTc about 430 msec.  - Continue current Tikosyn dosed for renal function.  - HR in 50s, keep atenolol at lower dose 25 mg daily rather than bid.  - Continue coumadin.  2. CAD: Stable, no chest pain.  No ASA with coumadin.  Continue statin.  3. Chronic diastolic CHF: Stable, on Lasix every other day.  4. Disposition: May go home today.  Followup afib clinic 7-10 days.  Followup with me at [facility] in 1 month.  Home meds: Same as at admission except decrease atenolol to 25 mg daily and add dofetilide 250 mcg bid.   Loralie Champagne 03/14/2015 8:52 AM

## 2015-03-14 NOTE — Progress Notes (Signed)
Discussed with the patient and his wife and all questioned fully answered. Discharge teaching included medications, when to call MD, atrial fibrilation, coumadin, and Tikosyn. He will call me if any problems arise. Pt given 7 day supply of Tikosyn. Faxed additional Tikosyn prescription to [facility] on [address] - called to confirm received Rx and were filling with generic per pt request.   Fritz Pickerel, RN

## 2015-03-14 NOTE — Care Management Important Message (Signed)
Important Message  Patient Details  Name: [name] JACOBOWITZ, MD MRN: [id number] Date of Birth: Oct 22, 1929   Medicare Important Message Given:  Yes    Nathen May 03/14/2015, 11:38 AM

## 2015-03-14 NOTE — Care Management Note (Signed)
Case Management Note Previous CM note initiated by Elenor Quinones RN, CM  Patient Details  Name: [name] TAVANO, MD MRN: [id number] Date of Birth: 11/15/1929  Subjective/Objective:  Pt is admitted for Tikosyn Load                  Action/Plan:  Pt is from home with wife (independent).  Pt will discharge home on Tikosyn, CM will assist with Tikosyn post discharge.   Expected Discharge Date:   03/14/15               Expected Discharge Plan:  Home/Self Care  In-House Referral:     Discharge planning Services  CM Consult, Medication Assistance  Post Acute Care Choice:    Choice offered to:     DME Arranged:    DME Agency:     HH Arranged:    HH Agency:     Status of Service:  Completed, signed off  Medicare Important Message Given:  Yes Date Medicare IM Given:    Medicare IM give by:    Date Additional Medicare IM Given:    Additional Medicare Important Message give by:     If discussed at [place] of Stay Meetings, dates discussed:    Additional Comments:  03/14/15- 1145- Marvetta Gibbons RN, BSN- pt for d/c home today- 7 day script has been sent to [facility] to fill- bedside RN to pick up when ready- pt has other script to take to his pharmacy.   S/W JESSICA @ [facility] # [phone]   TIKOSYN 250 MCG BID     AND   500 MCG BID   30 DAY SUPPLY   COVER- YES                     YES  CO-PAY- $ 265.0                  SAME  TIER- 4 DRUG                    SAME  PRIOR APPROVAL - NO            SAME   DOFETILIDE  250 MCG  BID    AND   500 MCG   COVER- YES                         YES  CO-PAY-  $ 265.00                     SAME  TIER- 4 DRUG                         SAME  PRIOR APPROVAL - NO                 NO   PHARMACY : [facility]    Generic TIkosyn Benefit check yielded:   Pt copay will be $265- deductible of $170 has not been met yet. After deductible has been met pt copay will be $95 .  MD made aware  CM communicated to pt the copay for Tikosyn, pt denied hardship with copay.  CM contacted pts pharmacy of choice [facility] on [address] and was informed that pharmacy can filled prescription within 3 days of receiving prescription, pt informed of need to get prescription to pharmacy immediately post discharge.  Pt informed that he will discharge home with 7 day supply.  CM provided attending sticky note requesting that prescription be faxed to [facility] on [address] per pts request.  Dawayne Patricia, RN 03/14/2015, 11:58 AM

## 2015-03-16 ENCOUNTER — Other Ambulatory Visit: Payer: Self-pay | Admitting: Family Medicine

## 2015-03-19 ENCOUNTER — Encounter (HOSPITAL_COMMUNITY): Payer: Self-pay | Admitting: Nurse Practitioner

## 2015-03-19 ENCOUNTER — Ambulatory Visit (HOSPITAL_COMMUNITY)
Admission: RE | Admit: 2015-03-19 | Discharge: 2015-03-19 | Disposition: A | Discharge status: Home / Self Care | Payer: Medicare Other | Source: Ambulatory Visit | Attending: Nurse Practitioner | Admitting: Nurse Practitioner

## 2015-03-19 VITALS — BP 128/78 | HR 61 | Ht 70.5 in | Wt 167.4 lb

## 2015-03-19 DIAGNOSIS — R9431 Abnormal electrocardiogram [ECG] [EKG]: Secondary | ICD-10-CM | POA: Diagnosis not present

## 2015-03-19 DIAGNOSIS — I481 Persistent atrial fibrillation: Secondary | ICD-10-CM

## 2015-03-19 DIAGNOSIS — I4891 Unspecified atrial fibrillation: Secondary | ICD-10-CM | POA: Insufficient documentation

## 2015-03-19 DIAGNOSIS — I4819 Other persistent atrial fibrillation: Secondary | ICD-10-CM

## 2015-03-19 NOTE — Progress Notes (Signed)
Patient ID: [name], MD, male   DOB: 06-04-1929, 82 y.o.   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 Over the last few months, he has had a higher afib burden. Not a candidate for 1C agents, due to h/o CAD. Recently tried multaq but felt like it exacerbated afib episodes. This was stopped. He had been in persistent afib x 4 days, rate controlled. He  noticed fatigue and exertional dyspnea when in afib. He was in the afib clinic 1/26 for discussion of best approach to restore sinus rhythm. He drinks minimal caffeine, no alcohol. Some snoring per wife, but no significant apnea spells. No prior sleep study but did have overnight pule oximetry in the remote past which did not show any significant desaturations.  Discussed ablation vrs amiodarone or tikosyn. Pt was concerned re possible side effects of amiodarone, more interested in [place]. He has a calculated GFR of 43 and it was discussed with pt he would require dose adjustment, probably to start around 250 mg bid. QTc reviewed and it looks satisfactory to use tiksoyn. Current drug therapy does not show any drugs that would be an issue with tikosyn use. Discussed ablation but due to age and size of left atrium(51 mm), chances of success would be only around 60-65%, with one out of 4 people needing a redo procedure. Last INR 1/6 was at 1.9. Dr. Aundra Dubin in to see pt, and reiterated above discussion and pt decided he would like to pursue Tikosyn.   Pt returns to afib clinic today after successful loading of tikosyn 1/31. Qtc stayed stable. Howerever, since he has been home he has had off and on afib, for 10-14 hours at a time. Dr. Aundra Dubin would like him referred to Dr. Rayann Heman for possible ablation.  Today, he denies symptoms of palpitations, chest pain,  shortness of breath, orthopnea, PND, lower extremity edema, dizziness, presyncope, syncope, or neurologic sequela. The patient is tolerating medications without difficulties and is otherwise without complaint today.   Past Medical History  Diagnosis Date  . CAD (coronary artery disease)   . Sinus bradycardia   . Anxiety   . Asthma   . Atrial fibrillation ([place])   . GERD (gastroesophageal reflux disease) 12/30/2000  . Hyperlipidemia   . Hypertension   . Allergic rhinitis   . Diverticulosis 03/16/1995  . Hiatal hernia 12/30/2000  . History of duodenal ulcer 10/15/1986  . Heart attack ([place]) 1984  . History of blood transfusion 1988    "related to GI bleeding OR"  . DJD (degenerative joint disease)     "most joints" (03/11/2015)  . Depression    Past Surgical History  Procedure Laterality Date  . Nasal septum surgery  1972  . Hospital ccu  1984    heart attack, hypoplastic right coronary  . Total knee arthroplasty Bilateral 1998-2004    "right-left"  . Coronary angioplasty with stent placement  2010  . Vagotomy and pyloroplasty  1988    "bleeding duodenal ulcer"  . Tonsillectomy    . Cholecystectomy open  1989  . Cardiac catheterization    . Inguinal hernia repair Right ~ 1990  . Cataract extraction w/ intraocular lens  implant, bilateral Bilateral ?2013    Current Outpatient Prescriptions  Medication Sig Dispense Refill  . acetaminophen (  TYLENOL) 325 MG tablet Take 2 tablets (650 mg total) by mouth every 4 (four) hours as needed for headache or mild pain.    Marland Kitchen atenolol (TENORMIN) 25 MG tablet Take 1-2 tablets (25-50 mg total) by mouth every evening. If pt wakes up in A-fib he takes an additional 25 mg 100 tablet 4  . atorvastatin (LIPITOR) 20 MG tablet Take 1 tablet (20 mg total) by mouth daily. 90 tablet 3  . buPROPion (WELLBUTRIN SR) 150 MG 12 hr tablet Take 1 tablet (150 mg total) by mouth daily. 100 tablet 4  . buPROPion (WELLBUTRIN SR) 150 MG 12 hr tablet TAKE 1 TABLET BY  MOUTH DAILY 100 tablet 0  . cetirizine (ZYRTEC) 10 MG tablet Take 10 mg by mouth daily.    . Cholecalciferol (VITAMIN D3) 400 UNITS CAPS Take by mouth daily.      Marland Kitchen dofetilide (TIKOSYN) 250 MCG capsule Take 1 capsule (250 mcg total) by mouth 2 (two) times daily. 14 capsule 0  . fluticasone (FLONASE) 50 MCG/ACT nasal spray Place 2 sprays into both nostrils daily. 32 g 6  . fluticasone (FLOVENT HFA) 110 MCG/ACT inhaler Inhale 1 puff into the lungs 2 (two) times daily. 3 Inhaler 12  . furosemide (LASIX) 20 MG tablet 1 tablet my mouth every other day.    . montelukast (SINGULAIR) 10 MG tablet TAKE 1 TABLET BY MOUTH ONCE DAILY 90 tablet 4  . nitroGLYCERIN (NITROSTAT) 0.4 MG SL tablet Place 1 tablet (0.4 mg total) under the tongue every 5 (five) minutes x 3 doses as needed for chest pain. 25 tablet 2  . Omega-3 Fatty Acids (FISH OIL) 1000 MG CAPS Take 1,000 mg by mouth daily.    Marland Kitchen omeprazole (PRILOSEC OTC) 20 MG tablet Take 1 tablet (20 mg total) by mouth daily. 100 tablet 4  . PROAIR HFA 108 (90 BASE) MCG/ACT inhaler INHALE 2 PUFFS INTO THE LUNGS EVERY 6 HOURS AS NEEDED FOR WHEEZING 8.5 g 0  . tamsulosin (FLOMAX) 0.4 MG CAPS capsule TAKE ONE CAPSULE BY MOUTH DAILY 100 capsule 4  . warfarin (COUMADIN) 5 MG tablet TAKE AS DIRECTED PER COUMADIN CLINIC. 5 mg on Sun, Tues, and Thurs.  2.5 mg all other days 90 tablet 1   No current facility-administered medications for this encounter.    No Known Allergies  Social History   Social History  . Marital Status: Married    Spouse Name: N/A  . Number of Children: 2  . Years of Education: N/A   Occupational History  . DOCTOR    Social History Main Topics  . Smoking status: Never Smoker   . Smokeless tobacco: Never Used  . Alcohol Use: No  . Drug Use: No  . Sexual Activity: Not Currently   Other Topics Concern  . Not on file   Social History Narrative   Married. Regular exercise - yes.     Family History  Problem Relation Age of Onset  .  Coronary artery disease Mother     deceased  . Deep vein thrombosis Sister   . Coronary artery disease Brother   . Asthma Brother   . Colon cancer Neg Hx     ROS- All systems are reviewed and negative except as per the HPI above  Physical Exam: Filed Vitals:   03/19/15 0843  BP: 128/78  Pulse: 61  Height: 5' 10.5" (1.791 m)  Weight: 167 lb 6.4 oz (75.932 kg)    GEN- The patient is well appearing, alert and  oriented x 3 today.   Head- normocephalic, atraumatic Eyes-  Sclera clear, conjunctiva pink Ears- hearing intact Oropharynx- clear Neck- supple, no JVP Lymph- no cervical lymphadenopathy Lungs- Clear to ausculation bilaterally, normal work of breathing Heart-Regular rate and rhythm, no murmurs, rubs or gallops, PMI not laterally displaced GI- soft, NT, ND, + BS Extremities- no clubbing, cyanosis, or edema MS- no significant deformity or atrophy Skin- no rash or lesion Psych- euthymic mood, full affect Neuro- strength and sensation are intact  EKG-SR at 61 bpm, pr int 152 ms, qrs int 74 ms, qtc 412 ms Epic records reviewed Echo- Left ventricle: The cavity size was normal. Wall thickness was normal. Systolic function was normal. The estimated ejection fraction was in the range of 60% to 65%. Wall motion was normal; there were no regional wall motion abnormalities. Features are consistent with a pseudonormal left ventricular filling pattern, with concomitant abnormal relaxation and increased filling pressure (grade 2 diastolic dysfunction). - Aortic valve: There was moderate to severe stenosis. There was trivial regurgitation. Valve area (VTI): 1.01 cm^2. Valve area (Vmax): 1.04 cm^2. Valve area (Vmean): 0.97 cm^2. - Mitral valve: Calcified annulus. - Left atrium: The atrium was mildly dilated.Size 51 mm Creatinine 1.3 on 1/6 and creatinine clearance calculated at 46.4  K+4.3 , mag 2.1, 5 days ago  Assessment and Plan: 1. Persistent symptomatic afib  despite recent tikosyn loading Therapeutic strategies for afib including medicine, tikosyn vrs amiodarone, were discussed in detail with the patient today.  Radiofrequency ablation for afib was also discussed, which Dr. Aundra Dubin would like him to see Dr. Rayann Heman to further discuss, although he might not be the most optimal candidate due to age and size of left atrium. For now continue tikosyn, atenolol  F/u with Dr. Rayann Heman, scheduled for 8:30, 2/9. Afib clinic as needed   Geroge Baseman. Khara Renaud, [facility] [address] [place], [place] [address] [phone]

## 2015-03-20 ENCOUNTER — Ambulatory Visit (INDEPENDENT_AMBULATORY_CARE_PROVIDER_SITE_OTHER): Payer: Medicare Other | Admitting: Internal Medicine

## 2015-03-20 ENCOUNTER — Encounter: Payer: Self-pay | Admitting: Internal Medicine

## 2015-03-20 VITALS — BP 136/80 | HR 63 | Ht 70.5 in | Wt 167.8 lb

## 2015-03-20 DIAGNOSIS — Z9861 Coronary angioplasty status: Secondary | ICD-10-CM

## 2015-03-20 DIAGNOSIS — I272 Other secondary pulmonary hypertension: Secondary | ICD-10-CM | POA: Diagnosis not present

## 2015-03-20 DIAGNOSIS — I2510 Atherosclerotic heart disease of native coronary artery without angina pectoris: Secondary | ICD-10-CM

## 2015-03-20 DIAGNOSIS — I10 Essential (primary) hypertension: Secondary | ICD-10-CM

## 2015-03-20 DIAGNOSIS — I480 Paroxysmal atrial fibrillation: Secondary | ICD-10-CM

## 2015-03-20 NOTE — Patient Instructions (Signed)
Medication Instructions:  Your physician recommends that you continue on your current medications as directed. Please refer to the Current Medication list given to you today.   Labwork: none  Testing/Procedures: none  Follow-Up: Your physician recommends that you schedule a follow-up appointment in: 2 weeks with Roderic Palau, NP - for PAF   Any Other Special Instructions Will Be Listed Below (If Applicable).     If you need a refill on your cardiac medications before your next appointment, please call your pharmacy.

## 2015-03-23 NOTE — Progress Notes (Signed)
Electrophysiology Office Note   Date:  03/23/2015   ID:  [name] Spence, MD, DOB 1929/12/28, MRN [id number]  PCP:  [name] Man, MD  Cardiologist:  Dr [name] [name] Primary Electrophysiologist: [name] Grayer, MD    Chief Complaint  Patient presents with  . 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   The patient reports having paroxysms of atrial fibrillation for 20 years.  Episodes have increased in frequency and duration since that time.  More recently, episodes have become very frequent.  He has failed medical therapy with mutlaq.  He was placed on Tikosyn a week ago.  He continues to have episodes of atrial fibrillation despite tikosyn.  He therefore presents today for further discussion.  He is unaware of triggers for his afib.  He finds that his exercise tolerance is significantly reduced by afib.  Today, he denies symptoms of palpitations, chest pain, shortness of breath, orthopnea, PND, lower extremity edema, claudication, dizziness, presyncope, syncope, bleeding, or neurologic sequela. The patient is tolerating medications without difficulties and is otherwise without complaint today.    Past Medical History  Diagnosis Date  . CAD (coronary artery disease)   . Sinus bradycardia   . Anxiety   . Asthma   . Atrial fibrillation ([place])   . GERD (gastroesophageal reflux disease) 12/30/2000  . Hyperlipidemia   . Hypertension   . Allergic rhinitis   . Diverticulosis 03/16/1995  . Hiatal hernia 12/30/2000  . History of duodenal ulcer 10/15/1986  . Heart attack ([place]) 1984  . History of blood transfusion 1988    "related to GI bleeding OR"  . DJD (degenerative joint disease)     "most joints" (03/11/2015)  . Depression    Past Surgical History  Procedure Laterality Date  . Nasal septum surgery  1972  . Hospital ccu  1984    heart attack, hypoplastic right coronary  . Total knee  arthroplasty Bilateral 1998-2004    "right-left"  . Coronary angioplasty with stent placement  2010  . Vagotomy and pyloroplasty  1988    "bleeding duodenal ulcer"  . Tonsillectomy    . Cholecystectomy open  1989  . Cardiac catheterization    . Inguinal hernia repair Right ~ 1990  . Cataract extraction w/ intraocular lens  implant, bilateral Bilateral ?2013     Current Outpatient Prescriptions  Medication Sig Dispense Refill  . acetaminophen (TYLENOL) 325 MG tablet Take 2 tablets (650 mg total) by mouth every 4 (four) hours as needed for headache or mild pain.    Marland Kitchen atenolol (TENORMIN) 25 MG tablet Take 1-2 tablets (25-50 mg total) by mouth every evening. If pt wakes up in A-fib he takes an additional 25 mg 100 tablet 4  . atorvastatin (LIPITOR) 20 MG tablet Take 1 tablet (20 mg total) by mouth daily. 90 tablet 3  . buPROPion (WELLBUTRIN SR) 150 MG 12 hr tablet Take 1 tablet (150 mg total) by mouth daily. 100 tablet 4  . cetirizine (ZYRTEC) 10 MG tablet Take 10 mg by mouth daily.    . Cholecalciferol (VITAMIN D3) 400 UNITS CAPS Take by mouth daily.      Marland Kitchen dofetilide (TIKOSYN) 250 MCG capsule Take 1 capsule (250 mcg total) by mouth 2 (two) times daily. 14 capsule 0  . fluticasone (FLONASE) 50 MCG/ACT nasal spray Place 2 sprays into both nostrils daily. 32 g 6  . fluticasone (FLOVENT HFA) 110  MCG/ACT inhaler Inhale 1 puff into the lungs 2 (two) times daily. 3 Inhaler 12  . furosemide (LASIX) 20 MG tablet 1 tablet my mouth every other day.    . montelukast (SINGULAIR) 10 MG tablet TAKE 1 TABLET BY MOUTH ONCE DAILY 90 tablet 4  . nitroGLYCERIN (NITROSTAT) 0.4 MG SL tablet Place 1 tablet (0.4 mg total) under the tongue every 5 (five) minutes x 3 doses as needed for chest pain. 25 tablet 2  . Omega-3 Fatty Acids (FISH OIL) 1000 MG CAPS Take 1,000 mg by mouth daily.    Marland Kitchen omeprazole (PRILOSEC OTC) 20 MG tablet Take 1 tablet (20 mg total) by mouth daily. 100 tablet 4  . PROAIR HFA 108 (90 BASE)  MCG/ACT inhaler INHALE 2 PUFFS INTO THE LUNGS EVERY 6 HOURS AS NEEDED FOR WHEEZING 8.5 g 0  . tamsulosin (FLOMAX) 0.4 MG CAPS capsule TAKE ONE CAPSULE BY MOUTH DAILY 100 capsule 4  . warfarin (COUMADIN) 5 MG tablet TAKE AS DIRECTED PER COUMADIN CLINIC. 5 mg on Sun, Tues, and Thurs.  2.5 mg all other days 90 tablet 1   No current facility-administered medications for this visit.    Allergies:   Review of patient's allergies indicates no known allergies.   Social History:  The patient  reports that he has never smoked. He has never used smokeless tobacco. He reports that he does not drink alcohol or use illicit drugs.   Family History:  The patient's  family history includes Asthma in his brother; Coronary artery disease in his brother and mother; Deep vein thrombosis in his sister. There is no history of Colon cancer.    ROS:  Please see the history of present illness.   All other systems are reviewed and negative.    PHYSICAL EXAM: VS:  BP 136/80 mmHg  Pulse 63  Ht 5' 10.5" (1.791 m)  Wt 167 lb 12.8 oz (76.114 kg)  BMI 23.73 kg/m2 , BMI Body mass index is 23.73 kg/(m^2). GEN: Well nourished, well developed, in no acute distress HEENT: normal Neck: no JVD, carotid bruits, or masses Cardiac: RRR; no murmurs, rubs, or gallops,no edema  Respiratory:  clear to auscultation bilaterally, normal work of breathing GI: soft, nontender, nondistended, + BS MS: no deformity or atrophy Skin: warm and dry  Neuro:  Strength and sensation are intact Psych: euthymic mood, full affect  EKG:  EKG 03/19/15 reveals sinus rhythm 61 bpm, Qtc 412 msec, diffuse TWI   Recent Labs: 12/02/2014: ALT 16; TSH 2.97 03/14/2015: BUN 22*; Creatinine, Ser 1.15; Hemoglobin 12.6*; Magnesium 2.1; Platelets 202; Potassium 4.3; Sodium 138    Lipid Panel     Component Value Date/Time   CHOL 86 12/02/2014 1155   TRIG 82.0 12/02/2014 1155   HDL 32.50* 12/02/2014 1155   CHOLHDL 3 12/02/2014 1155   VLDL 16.4  12/02/2014 1155   LDLCALC 37 12/02/2014 1155     Wt Readings from Last 3 Encounters:  03/20/15 167 lb 12.8 oz (76.114 kg)  03/19/15 167 lb 6.4 oz (75.932 kg)  03/11/15 169 lb 8 oz (76.885 kg)      Other studies Reviewed: Additional studies/ records that were reviewed today include: Dr Oleh Genin notes, recent hospital records, [facility] notes, prior echo  Review of the above records today demonstrates: echo 7/16 reveals EF 60%, moderate biatrial enlargement (LA 107mm), moderate MR, moderate TR   ASSESSMENT AND PLAN:  1.  Atrial fibrillation The patient has symptomatic paroxysmal atrial fibrillation.  He has failed medical  therapy with multaq.  He has recently been placed on tikosyn but continues to have some afib.  Given significant atriopathy (biatrial) and valvular dysfunction, I worry that our ability to maintain sinus rhythm long temr is reduced. Therapeutic strategies for afib including medicine (amiodarone) and ablation were discussed in detail with the patient today. Risk, benefits, and alternatives to EP study and radiofrequency ablation for afib were also discussed in detail today.  Risks of amiodarone were also discussed.  I have advised that he take amiodarone.  I would recommend this prior to consideration of ablation given his advanced age and structural heart changes.  Presently, he feels that he has not fully given tikosyn a chance.  He wishes to continue his current medical therapy. He will follow-up with Butch Penny in the AF clinic in 2 weeks.  If he continues to have afib, I would advise amiodarone.  If his AF has improved then perhaps he can stay with tikosyn. He will follow with Butch Penny and Dr [name] [name].  I am happy to see him again in the future should we need to further consider ablation.  As it has been over 6 months since his last echo, I would advise that he have a repeat echo in the near future.  This may also be helpful to further assess his valvular dysfunction and pulmonary  hypertension. Today, I discussed Coumadin as well as novel anticoagulants as indicated for risk reduction in stroke and systemic emboli with nonvalvular atrial fibrillation.  Risks, benefits, and alternatives to each of these drugs were discussed at length today.  He is clear in his decision to stay on coumadin at this time.  2. HTN Stable No change required today  3. CAD No ischemic symptoms No changes today  Today, I have spent 40  minutes with the patient discussing afib and its management .  More than 50% of the visit time today was spent on this issue.    Current medicines are reviewed at length with the patient today.   The patient does not have concerns regarding his medicines.  The following changes were made today:  none   Signed, [name] Grayer, MD  03/23/2015 3:16 PM     [address] [place] [address] [phone] (office) [phone] (fax)

## 2015-03-24 ENCOUNTER — Ambulatory Visit (HOSPITAL_COMMUNITY): Payer: Medicare Other | Admitting: Nurse Practitioner

## 2015-03-28 ENCOUNTER — Ambulatory Visit (INDEPENDENT_AMBULATORY_CARE_PROVIDER_SITE_OTHER): Payer: Medicare Other | Admitting: Internal Medicine

## 2015-03-28 ENCOUNTER — Encounter: Payer: Self-pay | Admitting: Internal Medicine

## 2015-03-28 VITALS — BP 124/68 | HR 54 | Ht 70.0 in | Wt 167.8 lb

## 2015-03-28 DIAGNOSIS — J4520 Mild intermittent asthma, uncomplicated: Secondary | ICD-10-CM

## 2015-03-28 DIAGNOSIS — R490 Dysphonia: Secondary | ICD-10-CM

## 2015-03-28 NOTE — Patient Instructions (Signed)
Fine to continue as you are doing since it seems to be working well for you.   Dr Sherren Mocha can refill meds, so we will be happy to see you if we can help.

## 2015-03-28 NOTE — Progress Notes (Signed)
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 Followed in past for asthma w/ bronchitis. LOV around 2008. Medical hx CAD/ MI/CABG, AFib/ warfarin, diastolic dysfunction, GERD He did well for several years with help from Dr Sherren Mocha. Starting about 6 months ago with no obvious trigger, he began having increased wheeze, cough, needing SABA 2-3x/ day and Qvar only intermittently. He has been worse this Spring, blaming pollen. Sputum remains clear. Denies fever, nodes, blood, edema, chest pain. Hx  Intermittent AFib- no pacemaker Had BCG in medical school.         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 Followed in past for asthma w/ bronchitis.  Medical hx CAD/ MI/CABG,P AFib/ warfarin, diastolic dysfunction, GERD. Dr Coralie Keens alerted [place] that pt was having more trouble, so we could work him in today. Follows For: Pt c/o wheezing and SOB, prod cough with thick yellow mucus. States he had asthma flare up last night. Has has had 3 episodes of A-fib in last week, most recent was 06/18/14.  Persistent variable chest rattle has been going on almost since he was last seen here. Using albuterol rescue inhaler once or twice daily. He has caught a cold from his wife and overnight began or persistent wheeze, using rescue inhaler 4 times daily and continuing his Qvar. Transient relief only. Sputum trace yellow. No fever. He has a little left over prednisone but reports that 20 mg of prednisone daily is enough of a stimulant to trigger his atrial fib.  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  Medical hx CAD/ MI/CABG,P AFib/ warfarin, diastolic dysfunction, GERD Follow For: Pt doing well since last visit. Still post nasal drip. Denies wheezing, SOB, Prod cough.  Has not needed rescue  inhaler in months and denies wheeze since a cold in early spring. Using Qvar twice daily and Singulair. Notices some hoarseness, mouth breathing  Told he has some pulmonary hypertension. We discussed possible role of oxygen desaturation. Does not recognize symptoms of sleep apnea. CXR 06/24/14 IMPRESSION: No active cardiopulmonary disease. Electronically Signed  By: Franchot Gallo M.D.  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 Medical history CAD/MI/CABG, P A. fib/warfarin, diastolic dysfunction, GERD FOLLOWS FOR: Pt states he has had very little SOB or wheezing since last OV. Has had trouble with Afib. He has continued Flovent 110 and pro air rescue inhaler twice daily each. Has not recognized any association of atrial fib or palpitations with use of albuterol. [name] last year with no particular benefit recognized over his current regimen. He asks about persistent hoarseness. Overnight Oximetry 09/30/2014-normal, not qualifying for sleep O2  ROS-see HPI Constitutional:   No-   weight loss, night sweats, fevers, chills, fatigue, lassitude. HEENT:   No-  headaches, difficulty swallowing, tooth/dental problems, sore throat,       No-  sneezing, itching, ear ache, +nasal congestion, post nasal drip,  CV:  No-   chest pain, orthopnea, PND, swelling in lower extremities, anasarca,                                                      dizziness, palpitations Resp: No-   shortness of breath with  exertion or at rest.             productive cough,  + non-productive cough,  No- coughing up of blood.              No-   change in color of mucus.  +wheezing.   Skin: No-   rash or lesions. GI:  No-   heartburn, indigestion, abdominal pain, nausea, vomiting,  GU:  pain. MS:  No-   joint pain or swelling.   Neuro-     nothing unusual Psych:  No- change in mood or affect. No depression or anxiety.  No memory loss.  OBJ-  Physical Exam General- Alert, Oriented, Affect-appropriate, Distress- none acute Skin- rash-none, lesions- none, excoriation- none Lymphadenopathy- none Head- atraumatic            Eyes- Gross vision intact, PERRLA, conjunctivae and secretions clear            Ears- Hearing aid +            Nose- + stuffy, no-Septal dev, mucus, polyps, erosion, perforation             Throat- Mallampati II-III , mucosa clear-not red , drainage- none, tonsils- atrophic. +Hoarse Neck- flexible , trachea midline, no stridor , thyroid nl, carotid no bruit Chest - symmetrical excursion , unlabored           Heart/CV- RRR without pacemaker , no murmur , no gallop  , no rub, nl s1 s2                           - JVD- none , edema- none, stasis changes- none, varices- none           Lung-  clear, wheeze -none, cough-none , dullness-none, rub- none           Chest wall-  Abd-  Br/ Gen/ Rectal- Not done, not indicated Extrem- cyanosis- none, clubbing, none, atrophy- none, strength- nl Neuro- grossly intact to observation

## 2015-03-30 DIAGNOSIS — R490 Dysphonia: Secondary | ICD-10-CM | POA: Insufficient documentation

## 2015-03-30 NOTE — Assessment & Plan Note (Signed)
He is using combined Pro air (albuterol rescue) and Flovent 110 (steroid) twice daily as a maintenance regimen. This probably actually represents less stimulant bronchodilator exposure over 24 hours than Breo which we tried last year. He recognizes no heart rhythm disturbance associated with the albuterol and feels he gets adequate control.

## 2015-03-30 NOTE — Assessment & Plan Note (Signed)
Persistent mild hoarseness without stridor. I don't see obvious thrush although horses could be caused by regular use of Flovent. He minimizes GERD symptoms. Plan-recommended ENT evaluation to include laryngoscopy

## 2015-03-31 ENCOUNTER — Inpatient Hospital Stay (HOSPITAL_COMMUNITY)
Admission: EM | Admit: 2015-03-31 | Discharge: 2015-04-02 | DRG: 065 | Disposition: A | Discharge status: Home / Self Care | Payer: Medicare Other | Attending: Internal Medicine | Admitting: Internal Medicine

## 2015-03-31 ENCOUNTER — Emergency Department (HOSPITAL_COMMUNITY): Payer: Medicare Other

## 2015-03-31 ENCOUNTER — Encounter (HOSPITAL_COMMUNITY): Payer: Self-pay | Admitting: Emergency Medicine

## 2015-03-31 DIAGNOSIS — I5189 Other ill-defined heart diseases: Secondary | ICD-10-CM | POA: Diagnosis present

## 2015-03-31 DIAGNOSIS — I252 Old myocardial infarction: Secondary | ICD-10-CM | POA: Diagnosis not present

## 2015-03-31 DIAGNOSIS — I481 Persistent atrial fibrillation: Secondary | ICD-10-CM | POA: Diagnosis present

## 2015-03-31 DIAGNOSIS — I6523 Occlusion and stenosis of bilateral carotid arteries: Secondary | ICD-10-CM | POA: Diagnosis not present

## 2015-03-31 DIAGNOSIS — G459 Transient cerebral ischemic attack, unspecified: Secondary | ICD-10-CM | POA: Diagnosis not present

## 2015-03-31 DIAGNOSIS — I611 Nontraumatic intracerebral hemorrhage in hemisphere, cortical: Secondary | ICD-10-CM | POA: Diagnosis present

## 2015-03-31 DIAGNOSIS — L500 Allergic urticaria: Secondary | ICD-10-CM | POA: Diagnosis not present

## 2015-03-31 DIAGNOSIS — T39015A Adverse effect of aspirin, initial encounter: Secondary | ICD-10-CM | POA: Diagnosis not present

## 2015-03-31 DIAGNOSIS — J45909 Unspecified asthma, uncomplicated: Secondary | ICD-10-CM | POA: Diagnosis not present

## 2015-03-31 DIAGNOSIS — I639 Cerebral infarction, unspecified: Secondary | ICD-10-CM | POA: Diagnosis not present

## 2015-03-31 DIAGNOSIS — R29704 NIHSS score 4: Secondary | ICD-10-CM | POA: Diagnosis present

## 2015-03-31 DIAGNOSIS — I638 Other cerebral infarction: Secondary | ICD-10-CM | POA: Diagnosis not present

## 2015-03-31 DIAGNOSIS — I4891 Unspecified atrial fibrillation: Secondary | ICD-10-CM | POA: Diagnosis not present

## 2015-03-31 DIAGNOSIS — I480 Paroxysmal atrial fibrillation: Secondary | ICD-10-CM | POA: Diagnosis not present

## 2015-03-31 DIAGNOSIS — R55 Syncope and collapse: Secondary | ICD-10-CM | POA: Diagnosis not present

## 2015-03-31 DIAGNOSIS — Z955 Presence of coronary angioplasty implant and graft: Secondary | ICD-10-CM | POA: Diagnosis not present

## 2015-03-31 DIAGNOSIS — Y92230 Patient room in hospital as the place of occurrence of the external cause: Secondary | ICD-10-CM | POA: Diagnosis not present

## 2015-03-31 DIAGNOSIS — Z96653 Presence of artificial knee joint, bilateral: Secondary | ICD-10-CM | POA: Diagnosis not present

## 2015-03-31 DIAGNOSIS — J309 Allergic rhinitis, unspecified: Secondary | ICD-10-CM | POA: Diagnosis present

## 2015-03-31 DIAGNOSIS — T508X5A Adverse effect of diagnostic agents, initial encounter: Secondary | ICD-10-CM | POA: Diagnosis not present

## 2015-03-31 DIAGNOSIS — I10 Essential (primary) hypertension: Secondary | ICD-10-CM | POA: Diagnosis present

## 2015-03-31 DIAGNOSIS — I519 Heart disease, unspecified: Secondary | ICD-10-CM

## 2015-03-31 DIAGNOSIS — I2510 Atherosclerotic heart disease of native coronary artery without angina pectoris: Secondary | ICD-10-CM | POA: Diagnosis present

## 2015-03-31 DIAGNOSIS — K219 Gastro-esophageal reflux disease without esophagitis: Secondary | ICD-10-CM | POA: Diagnosis not present

## 2015-03-31 DIAGNOSIS — Z9861 Coronary angioplasty status: Secondary | ICD-10-CM

## 2015-03-31 DIAGNOSIS — Z7901 Long term (current) use of anticoagulants: Secondary | ICD-10-CM

## 2015-03-31 DIAGNOSIS — I6789 Other cerebrovascular disease: Secondary | ICD-10-CM | POA: Diagnosis not present

## 2015-03-31 DIAGNOSIS — G8194 Hemiplegia, unspecified affecting left nondominant side: Secondary | ICD-10-CM | POA: Diagnosis not present

## 2015-03-31 DIAGNOSIS — E785 Hyperlipidemia, unspecified: Secondary | ICD-10-CM | POA: Diagnosis present

## 2015-03-31 DIAGNOSIS — I4819 Other persistent atrial fibrillation: Secondary | ICD-10-CM

## 2015-03-31 DIAGNOSIS — Z8673 Personal history of transient ischemic attack (TIA), and cerebral infarction without residual deficits: Secondary | ICD-10-CM | POA: Diagnosis present

## 2015-03-31 DIAGNOSIS — F329 Major depressive disorder, single episode, unspecified: Secondary | ICD-10-CM | POA: Diagnosis present

## 2015-03-31 DIAGNOSIS — R4781 Slurred speech: Secondary | ICD-10-CM | POA: Diagnosis present

## 2015-03-31 DIAGNOSIS — I6389 Other cerebral infarction: Secondary | ICD-10-CM | POA: Insufficient documentation

## 2015-03-31 DIAGNOSIS — F419 Anxiety disorder, unspecified: Secondary | ICD-10-CM | POA: Diagnosis present

## 2015-03-31 DIAGNOSIS — I612 Nontraumatic intracerebral hemorrhage in hemisphere, unspecified: Secondary | ICD-10-CM | POA: Diagnosis not present

## 2015-03-31 DIAGNOSIS — I619 Nontraumatic intracerebral hemorrhage, unspecified: Secondary | ICD-10-CM | POA: Insufficient documentation

## 2015-03-31 DIAGNOSIS — T7840XA Allergy, unspecified, initial encounter: Secondary | ICD-10-CM

## 2015-03-31 LAB — DIFFERENTIAL
BASOS ABS: 0 10*3/uL (ref 0.0–0.1)
BASOS PCT: 0 %
EOS ABS: 0.4 10*3/uL (ref 0.0–0.7)
EOS PCT: 4 %
LYMPHS ABS: 1.6 10*3/uL (ref 0.7–4.0)
Lymphocytes Relative: 17 %
MONO ABS: 0.9 10*3/uL (ref 0.1–1.0)
MONOS PCT: 10 %
Neutro Abs: 6.4 10*3/uL (ref 1.7–7.7)
Neutrophils Relative %: 69 %

## 2015-03-31 LAB — I-STAT TROPONIN, ED: Troponin i, poc: 0 ng/mL (ref 0.00–0.08)

## 2015-03-31 LAB — I-STAT CHEM 8, ED
BUN: 37 mg/dL — ABNORMAL HIGH (ref 6–20)
CREATININE: 1.4 mg/dL — AB (ref 0.61–1.24)
Calcium, Ion: 1.08 mmol/L — ABNORMAL LOW (ref 1.13–1.30)
Chloride: 102 mmol/L (ref 101–111)
GLUCOSE: 175 mg/dL — AB (ref 65–99)
HEMATOCRIT: 46 % (ref 39.0–52.0)
Hemoglobin: 15.6 g/dL (ref 13.0–17.0)
POTASSIUM: 4.6 mmol/L (ref 3.5–5.1)
Sodium: 137 mmol/L (ref 135–145)
TCO2: 25 mmol/L (ref 0–100)

## 2015-03-31 LAB — RAPID URINE DRUG SCREEN, HOSP PERFORMED
AMPHETAMINES: NOT DETECTED
BENZODIAZEPINES: NOT DETECTED
Barbiturates: NOT DETECTED
Cocaine: NOT DETECTED
OPIATES: NOT DETECTED
Tetrahydrocannabinol: NOT DETECTED

## 2015-03-31 LAB — COMPREHENSIVE METABOLIC PANEL
ALT: 16 U/L — AB (ref 17–63)
AST: 20 U/L (ref 15–41)
Albumin: 3.6 g/dL (ref 3.5–5.0)
Alkaline Phosphatase: 82 U/L (ref 38–126)
Anion gap: 9 (ref 5–15)
BUN: 31 mg/dL — ABNORMAL HIGH (ref 6–20)
CHLORIDE: 102 mmol/L (ref 101–111)
CO2: 23 mmol/L (ref 22–32)
CREATININE: 1.53 mg/dL — AB (ref 0.61–1.24)
Calcium: 8.9 mg/dL (ref 8.9–10.3)
GFR calc non Af Amer: 40 mL/min — ABNORMAL LOW (ref >60)
GFR, EST AFRICAN AMERICAN: 47 mL/min — AB (ref >60)
Glucose, Bld: 174 mg/dL — ABNORMAL HIGH (ref 65–99)
Potassium: 4.5 mmol/L (ref 3.5–5.1)
Sodium: 134 mmol/L — ABNORMAL LOW (ref 135–145)
Total Bilirubin: 0.4 mg/dL (ref 0.3–1.2)
Total Protein: 6.5 g/dL (ref 6.5–8.1)

## 2015-03-31 LAB — PROTIME-INR
INR: 1.91 — ABNORMAL HIGH (ref 0.00–1.49)
Prothrombin Time: 21.8 seconds — ABNORMAL HIGH (ref 11.6–15.2)

## 2015-03-31 LAB — URINALYSIS, ROUTINE W REFLEX MICROSCOPIC
BILIRUBIN URINE: NEGATIVE
GLUCOSE, UA: NEGATIVE mg/dL
HGB URINE DIPSTICK: NEGATIVE
Ketones, ur: NEGATIVE mg/dL
Leukocytes, UA: NEGATIVE
Nitrite: NEGATIVE
PH: 5 (ref 5.0–8.0)
Protein, ur: NEGATIVE mg/dL
SPECIFIC GRAVITY, URINE: 1.033 — AB (ref 1.005–1.030)

## 2015-03-31 LAB — APTT: APTT: 31 s (ref 24–37)

## 2015-03-31 LAB — CBC
HEMATOCRIT: 43.9 % (ref 39.0–52.0)
HEMOGLOBIN: 14 g/dL (ref 13.0–17.0)
MCH: 29.6 pg (ref 26.0–34.0)
MCHC: 31.9 g/dL (ref 30.0–36.0)
MCV: 92.8 fL (ref 78.0–100.0)
Platelets: 228 10*3/uL (ref 150–400)
RBC: 4.73 MIL/uL (ref 4.22–5.81)
RDW: 13.3 % (ref 11.5–15.5)
WBC: 9.3 10*3/uL (ref 4.0–10.5)

## 2015-03-31 LAB — ETHANOL

## 2015-03-31 LAB — CBG MONITORING, ED: Glucose-Capillary: 141 mg/dL — ABNORMAL HIGH (ref 65–99)

## 2015-03-31 MED ORDER — FUROSEMIDE 20 MG PO TABS
20.0000 mg | ORAL_TABLET | ORAL | Status: DC
  Administered 2015-04-02: 20 mg via ORAL
  Filled 2015-03-31: qty 1

## 2015-03-31 MED ORDER — FLUTICASONE PROPIONATE 50 MCG/ACT NA SUSP
2.0000 | Freq: Every day | NASAL | Status: DC
  Administered 2015-04-01 – 2015-04-02 (×2): 2 via NASAL
  Filled 2015-03-31: qty 16

## 2015-03-31 MED ORDER — PANTOPRAZOLE SODIUM 40 MG PO TBEC
40.0000 mg | DELAYED_RELEASE_TABLET | Freq: Every day | ORAL | Status: DC
  Administered 2015-04-01 – 2015-04-02 (×2): 40 mg via ORAL
  Filled 2015-03-31 (×2): qty 1

## 2015-03-31 MED ORDER — SENNOSIDES-DOCUSATE SODIUM 8.6-50 MG PO TABS: 1.0000 | ORAL_TABLET | Freq: Every evening | ORAL | Status: DC | PRN

## 2015-03-31 MED ORDER — WARFARIN SODIUM 2.5 MG PO TABS
2.5000 mg | ORAL_TABLET | Freq: Once | ORAL | Status: AC
  Administered 2015-03-31: 2.5 mg via ORAL
  Filled 2015-03-31: qty 1

## 2015-03-31 MED ORDER — TAMSULOSIN HCL 0.4 MG PO CAPS
0.4000 mg | ORAL_CAPSULE | Freq: Every day | ORAL | Status: DC
  Administered 2015-04-01: 0.4 mg via ORAL
  Filled 2015-03-31: qty 1

## 2015-03-31 MED ORDER — WARFARIN - PHARMACIST DOSING INPATIENT: Freq: Every day | Status: DC

## 2015-03-31 MED ORDER — ASPIRIN 325 MG PO TABS
325.0000 mg | ORAL_TABLET | Freq: Every day | ORAL | Status: DC
  Administered 2015-04-01: 325 mg via ORAL
  Filled 2015-03-31: qty 1

## 2015-03-31 MED ORDER — IOHEXOL 350 MG/ML SOLN
50.0000 mL | Freq: Once | INTRAVENOUS | Status: AC | PRN
  Administered 2015-03-31: 50 mL via INTRAVENOUS

## 2015-03-31 MED ORDER — ASPIRIN 300 MG RE SUPP: 300.0000 mg | Freq: Every day | RECTAL | Status: DC

## 2015-03-31 MED ORDER — VITAMIN D 1000 UNITS PO TABS
1000.0000 [IU] | ORAL_TABLET | Freq: Every day | ORAL | Status: DC
  Administered 2015-04-01 – 2015-04-02 (×2): 1000 [IU] via ORAL
  Filled 2015-03-31 (×2): qty 1

## 2015-03-31 MED ORDER — OMEPRAZOLE MAGNESIUM 20 MG PO TBEC: 20.0000 mg | DELAYED_RELEASE_TABLET | Freq: Every day | ORAL | Status: DC

## 2015-03-31 MED ORDER — NITROGLYCERIN 0.4 MG SL SUBL: 0.4000 mg | SUBLINGUAL_TABLET | SUBLINGUAL | Status: DC | PRN

## 2015-03-31 MED ORDER — MONTELUKAST SODIUM 10 MG PO TABS
10.0000 mg | ORAL_TABLET | Freq: Every day | ORAL | Status: DC
  Administered 2015-04-01: 10 mg via ORAL
  Filled 2015-03-31 (×2): qty 1

## 2015-03-31 MED ORDER — OMEGA-3-ACID ETHYL ESTERS 1 G PO CAPS
1.0000 g | ORAL_CAPSULE | Freq: Every day | ORAL | Status: DC
  Administered 2015-04-01 – 2015-04-02 (×2): 1 g via ORAL
  Filled 2015-03-31 (×2): qty 1

## 2015-03-31 MED ORDER — WARFARIN SODIUM 5 MG PO TABS: 2.5000 mg | ORAL_TABLET | Freq: Every day | ORAL | Status: DC

## 2015-03-31 MED ORDER — ATENOLOL 25 MG PO TABS
25.0000 mg | ORAL_TABLET | Freq: Two times a day (BID) | ORAL | Status: DC
  Administered 2015-04-01 – 2015-04-02 (×2): 25 mg via ORAL
  Filled 2015-03-31 (×3): qty 1

## 2015-03-31 MED ORDER — DOFETILIDE 250 MCG PO CAPS
250.0000 ug | ORAL_CAPSULE | Freq: Two times a day (BID) | ORAL | Status: DC
  Administered 2015-03-31 – 2015-04-02 (×4): 250 ug via ORAL
  Filled 2015-03-31 (×6): qty 1

## 2015-03-31 MED ORDER — BUPROPION HCL ER (SR) 150 MG PO TB12
150.0000 mg | ORAL_TABLET | Freq: Every day | ORAL | Status: DC
Start: 2015-04-01 — End: 2015-04-02
  Administered 2015-04-01 – 2015-04-02 (×2): 150 mg via ORAL
  Filled 2015-03-31 (×2): qty 1

## 2015-03-31 MED ORDER — ATORVASTATIN CALCIUM 10 MG PO TABS
20.0000 mg | ORAL_TABLET | Freq: Every day | ORAL | Status: DC
  Administered 2015-04-01: 20 mg via ORAL
  Filled 2015-03-31: qty 2

## 2015-03-31 MED ORDER — LORATADINE 10 MG PO TABS
10.0000 mg | ORAL_TABLET | Freq: Every day | ORAL | Status: DC
Start: 2015-04-01 — End: 2015-04-02
  Administered 2015-04-01 – 2015-04-02 (×2): 10 mg via ORAL
  Filled 2015-03-31 (×2): qty 1

## 2015-03-31 MED ORDER — STROKE: EARLY STAGES OF RECOVERY BOOK
Freq: Once | Status: AC
  Administered 2015-03-31: 1
  Filled 2015-03-31: qty 1

## 2015-03-31 NOTE — ED Notes (Signed)
Jenkins, MD at bedside.  

## 2015-03-31 NOTE — Consult Note (Signed)
Admission H&P    Chief Complaint: Acute onset of speech difficulty and left-sided weakness.  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 He has no previous history of stroke nor TIA. INR was 1.9. CT scan of his head showed no acute intracranial abnormality. CT angiogram of head and neck showed a right M2 branch flow gap of 3 mm with preserved flow distally. Nondominant vertebral artery showed moderate to severe stenosis at the origin and in the distal left V4 segment. Patient's deficits cleared fairly rapidly. NIH stroke score was 4 at the time of his initial evaluation. Patient subsequently became clear all deficits with no intervention.  LSN: 6:15 PM on 03/31/2015 tPA Given: No: Rapid resolution of deficits; INR of 1.9 mRankin:  Past Medical History  Diagnosis Date  . CAD (coronary artery disease)   . Sinus bradycardia   . Anxiety   . Asthma   . Atrial fibrillation ([place])   . GERD (gastroesophageal reflux disease) 12/30/2000  . Hyperlipidemia   . Hypertension   . Allergic rhinitis   . Diverticulosis 03/16/1995  . Hiatal hernia 12/30/2000  . History of duodenal ulcer 10/15/1986  . Heart attack ([place]) 1984  . History of blood transfusion 1988    "related to GI bleeding OR"  . DJD (degenerative joint disease)     "most joints" (03/11/2015)  . Depression     Past Surgical History  Procedure Laterality Date  . Nasal septum surgery  1972  . Hospital ccu  1984    heart attack, hypoplastic right coronary  . Total knee arthroplasty Bilateral 1998-2004    "right-left"  . Coronary angioplasty with stent placement  2010  . Vagotomy and pyloroplasty  1988     "bleeding duodenal ulcer"  . Tonsillectomy    . Cholecystectomy open  1989  . Cardiac catheterization    . Inguinal hernia repair Right ~ 1990  . Cataract extraction w/ intraocular lens  implant, bilateral Bilateral ?2013    Family History  Problem Relation Age of Onset  . Coronary artery disease Mother     deceased  . Deep vein thrombosis Sister   . Coronary artery disease Brother   . Asthma Brother   . Colon cancer Neg Hx    Social History:  reports that he has never smoked. He has never used smokeless tobacco. He reports that he does not drink alcohol or use illicit drugs.  Allergies: No Known Allergies  Medications: Patient's preadmission medications were reviewed by me.  ROS: History obtained from the patient  General ROS: negative for - chills, fatigue, fever, night sweats, weight gain or weight loss Psychological ROS: negative for - behavioral disorder, hallucinations, memory difficulties, mood swings or suicidal ideation Ophthalmic ROS: negative for - blurry vision, double vision, eye pain or loss of vision ENT ROS: negative for - epistaxis, nasal discharge, oral lesions, sore throat, tinnitus or vertigo Allergy and Immunology ROS: negative for - hives or itchy/watery eyes Hematological and Lymphatic ROS: negative for - bleeding problems, bruising or swollen lymph nodes Endocrine ROS: negative for - galactorrhea, hair pattern changes, polydipsia/polyuria or temperature intolerance Respiratory ROS: negative for - cough, hemoptysis, shortness of breath or wheezing Cardiovascular ROS: negative for -  chest pain, dyspnea on exertion, edema or irregular heartbeat Gastrointestinal ROS: negative for - abdominal pain, diarrhea, hematemesis, nausea/vomiting or stool incontinence Genito-Urinary ROS: negative for - dysuria, hematuria, incontinence or urinary frequency/urgency Musculoskeletal ROS: negative for - joint swelling or muscular weakness Neurological ROS: as noted in  HPI Dermatological ROS: negative for rash and skin lesion changes  Physical Examination: Blood pressure 171/78, pulse 73, temperature 96.2 F (35.7 C), temperature source Axillary, resp. rate 24, height '5\' 10"'  (1.778 m), weight 76.7 kg (169 lb 1.5 oz), SpO2 96 %.  HEENT-  Normocephalic, no lesions, without obvious abnormality.  Normal external eye and conjunctiva.  Normal TM's bilaterally.  Normal auditory canals and external ears. Normal external nose, mucus membranes and septum.  Normal pharynx. Neck supple with no masses, nodes, nodules or enlargement. Cardiovascular - regular rate and rhythm, S1, S2 normal, no murmur, click, rub or gallop Lungs - chest clear, no wheezing, rales, normal symmetric air entry Abdomen - soft, non-tender; bowel sounds normal; no masses,  no organomegaly Extremities - no joint deformities, effusion, or inflammation and no edema  Neurologic Examination: Mental Status: Alert, oriented, thought content appropriate.  Speech initially was moderately slurred but cleared without evidence of aphasia. Able to follow commands without difficulty. Cranial Nerves: II-Visual fields were normal. III/IV/VI-Pupils were equal and reacted normally to light. Extraocular movements were full and conjugate.    V/VII-no facial numbness; mild right lower facial weakness subsequently resolved. VIII-normal. X-moderate dysarthria which resolved; symmetrical palatal movement. XI: trapezius strength/neck flexion strength normal bilaterally XII-midline tongue extension with normal strength. Motor: 5/5 bilaterally with normal tone and bulk Sensory: Slight numbness to tactile sensation over left upper extremity, otherwise unremarkable. Deep Tendon Reflexes: 2+ and symmetric. Plantars: Mute bilaterally Cerebellar: Normal finger-to-nose testing. Carotid auscultation: Normal  Results for orders placed or performed during the hospital encounter of 03/31/15 (from the past 48 hour(s))   Ethanol     Status: None   Collection Time: 03/31/15 10:30 AM  Result Value Ref Range   Alcohol, Ethyl (B) <5 <5 mg/dL    Comment:        LOWEST DETECTABLE LIMIT FOR SERUM ALCOHOL IS 5 mg/dL FOR MEDICAL PURPOSES ONLY   Protime-INR     Status: Abnormal   Collection Time: 03/31/15  7:30 PM  Result Value Ref Range   Prothrombin Time 21.8 (H) 11.6 - 15.2 seconds   INR 1.91 (H) 0.00 - 1.49  APTT     Status: None   Collection Time: 03/31/15  7:30 PM  Result Value Ref Range   aPTT 31 24 - 37 seconds  CBC     Status: None   Collection Time: 03/31/15  7:30 PM  Result Value Ref Range   WBC 9.3 4.0 - 10.5 K/uL   RBC 4.73 4.22 - 5.81 MIL/uL   Hemoglobin 14.0 13.0 - 17.0 g/dL   HCT 43.9 39.0 - 52.0 %   MCV 92.8 78.0 - 100.0 fL   MCH 29.6 26.0 - 34.0 pg   MCHC 31.9 30.0 - 36.0 g/dL   RDW 13.3 11.5 - 15.5 %   Platelets 228 150 - 400 K/uL  Differential     Status: None   Collection Time: 03/31/15  7:30 PM  Result Value Ref Range   Neutrophils Relative % 69 %   Neutro Abs 6.4 1.7 - 7.7 K/uL   Lymphocytes Relative 17 %   Lymphs Abs 1.6 0.7 - 4.0 K/uL   Monocytes Relative 10 %   Monocytes Absolute  0.9 0.1 - 1.0 K/uL   Eosinophils Relative 4 %   Eosinophils Absolute 0.4 0.0 - 0.7 K/uL   Basophils Relative 0 %   Basophils Absolute 0.0 0.0 - 0.1 K/uL  Comprehensive metabolic panel     Status: Abnormal   Collection Time: 03/31/15  7:30 PM  Result Value Ref Range   Sodium 134 (L) 135 - 145 mmol/L   Potassium 4.5 3.5 - 5.1 mmol/L   Chloride 102 101 - 111 mmol/L   CO2 23 22 - 32 mmol/L   Glucose, Bld 174 (H) 65 - 99 mg/dL   BUN 31 (H) 6 - 20 mg/dL   Creatinine, Ser 1.53 (H) 0.61 - 1.24 mg/dL   Calcium 8.9 8.9 - 10.3 mg/dL   Total Protein 6.5 6.5 - 8.1 g/dL   Albumin 3.6 3.5 - 5.0 g/dL   AST 20 15 - 41 U/L   ALT 16 (L) 17 - 63 U/L   Alkaline Phosphatase 82 38 - 126 U/L   Total Bilirubin 0.4 0.3 - 1.2 mg/dL   GFR calc non Af Amer 40 (L) >60 mL/min   GFR calc Af Amer 47 (L) >60  mL/min    Comment: (NOTE) The eGFR has been calculated using the CKD EPI equation. This calculation has not been validated in all clinical situations. eGFR's persistently <60 mL/min signify possible Chronic Kidney Disease.    Anion gap 9 5 - 15  I-stat troponin, ED (not at [facility], [facility])     Status: None   Collection Time: 03/31/15  7:38 PM  Result Value Ref Range   Troponin i, poc 0.00 0.00 - 0.08 ng/mL   Comment 3            Comment: Due to the release kinetics of cTnI, a negative result within the first hours of the onset of symptoms does not rule out myocardial infarction with certainty. If myocardial infarction is still suspected, repeat the test at appropriate intervals.   I-Stat Chem 8, ED  (not at [facility], [facility])     Status: Abnormal   Collection Time: 03/31/15  7:40 PM  Result Value Ref Range   Sodium 137 135 - 145 mmol/L   Potassium 4.6 3.5 - 5.1 mmol/L   Chloride 102 101 - 111 mmol/L   BUN 37 (H) 6 - 20 mg/dL   Creatinine, Ser 1.40 (H) 0.61 - 1.24 mg/dL   Glucose, Bld 175 (H) 65 - 99 mg/dL   Calcium, Ion 1.08 (L) 1.13 - 1.30 mmol/L   TCO2 25 0 - 100 mmol/L   Hemoglobin 15.6 13.0 - 17.0 g/dL   HCT 46.0 39.0 - 52.0 %  CBG monitoring, ED     Status: Abnormal   Collection Time: 03/31/15  7:41 PM  Result Value Ref Range   Glucose-Capillary 141 (H) 65 - 99 mg/dL   Ct Angio Head W/cm &/or Wo Cm  03/31/2015  CLINICAL DATA:  82 year old male code stroke presentation with slurred speech and left side weakness. Symptoms partially resolved. Initial encounter. EXAM: CT ANGIOGRAPHY HEAD AND NECK TECHNIQUE: Multidetector CT imaging of the head and neck was performed using the standard protocol during bolus administration of intravenous contrast. Multiplanar CT image reconstructions and MIPs were obtained to evaluate the vascular anatomy. Carotid stenosis measurements (when applicable) are obtained utilizing NASCET criteria, using the distal internal carotid diameter as the denominator.  CONTRAST:  74m OMNIPAQUE IOHEXOL 350 MG/ML SOLN COMPARISON:  Head CT without contrast 1920 hours today. FINDINGS: CTA NECK Skeleton: Subtotal  paranasal sinus opacification including fluid levels in bubbly opacity in the frontal sinuses, posterior ethmoids, and sphenoid sinuses. Superimposed paranasal sinus periostitis. Tympanic cavities and mastoids are clear. Mild scoliosis and age appropriate degenerative changes in the spine. No acute osseous abnormality identified. Other neck: Negative lung apices. No superior mediastinal lymphadenopathy. Negative thyroid, larynx, pharynx, parapharyngeal spaces, retropharyngeal space, sublingual space, submandibular glands, and parotid glands. No cervical lymphadenopathy. Aortic arch: 3 vessel arch configuration. Very little calcified aortic plaque at the level of the great vessels. Moderate mostly calcified plaque in the distal arch distal to the left subclavian origin. No great vessel origin stenosis. Right carotid system: Minimal right CCA calcified plaque. Mild to moderate soft and calcified plaque at the right carotid bifurcation but less than 50 % stenosis with respect to the distal vessel at the right ICA origin and bulb. Negative cervical right ICA otherwise. Left carotid system: Mild left CCA calcified plaque without stenosis. Minimal calcified plaque at the left carotid bifurcation and bulb with no stenosis. Otherwise negative cervical left ICA. Vertebral arteries: No proximal subclavian artery stenosis. Calcified plaque at the right vertebral artery origin with mild origin stenosis (series 403, image 111). Soft and calcified plaque at the left vertebral artery origin with moderate to severe stenosis (series 403, image 127 series 401, images 48 and 49). The right vertebral artery is dominant. Both vertebral arteries remain patent to the skullbase with no additional stenosis. CTA HEAD Posterior circulation: Dominant distal right vertebral artery. Both PICA origins are  normal and arise early. Bilateral calcified plaque in the vertebral artery V4 segments. Mild stenosis on the right and moderate to severe stenosis of the non dominant distal left vertebral artery. The left vertebral artery then is highly attenuated leading up to the vertebrobasilar junction. Normal patency of the right vertebrobasilar junction. No basilar artery stenosis. Normal SCA and PCA origins. Mild venous contamination about the left PCA. Bilateral PCA branches are within normal limits. Anterior circulation: Right ICA siphon is patent with mild to moderate calcified plaque. Up to mild supra clinoid right ICA stenosis. Normal right ophthalmic artery origin. Patent right ICA terminus. Normal right MCA and ACA origins. Right MCA M1 segment and bifurcation remain patent. There is a high-grade stenosis in a right M2 middle still the middle sylvian division best seen on series 406, image 8. The distal vessel remains patent (same series image 7). Other right MCA branches are within normal limits. Left ICA siphon is patent with moderate calcified plaque most pronounced in the supraclinoid segment. Mild to moderate left supraclinoid stenosis. Left ICA terminus is patent. Left MCA and ACA origins are normal. Anterior communicating artery is diminutive or absent. Mild bilateral ACA branch irregularity, no ACA branch occlusion identified. Left MCA M1 segment, bifurcation, and left MCA branches are within normal limits. Venous sinuses: Patent. Anatomic variants: Dominant right vertebral artery. IMPRESSION: 1. Negative for emergent large vessel occlusion, but positive for a right MCA M2 branch lesion best seen on series 406 image 7. There is a ~3 mm flow gap, but preserved distal flow. This was discussed by telephone with Dr. Wallie Char on 03/31/2015 at 0829 hours. 2. Mild for age anterior circulation atherosclerosis in the neck. Up to moderate ICA siphon calcified plaque with mild to moderate supraclinoid stenoses,  greater on the left. 3. Nondominant left vertebral artery with moderate to severe stenosis at its origin and in the distal left V4 segment. 4. Dominant right vertebral artery with calcified plaque causing mild stenosis at its origin  and right V4 segment. 5. Otherwise negative posterior circulation. 6. Acute on chronic pansinusitis. Electronically Signed   By: Genevie Ann M.D.   On: 03/31/2015 20:35   Ct Head Wo Contrast  03/31/2015  CLINICAL DATA:  82 year old with acute onset of slurred speech approximately 6:30 p.m. this evening. Current history of atrial fibrillation. EXAM: CT HEAD WITHOUT CONTRAST TECHNIQUE: Contiguous axial images were obtained from the base of the skull through the vertex without intravenous contrast. COMPARISON:  None. FINDINGS: Ventricular system normal in size and appearance for age. Mild age related cortical atrophy. Old lacunar stroke in the inferior left basal ganglia. No mass lesion. No midline shift. No acute hemorrhage or hematoma. No extra-axial fluid collections. No evidence of acute infarction. No skull fracture or other focal osseous abnormality involving the skull. Near complete opacification of all of the visualized paranasal sinuses. Bilateral mastoid air cells and bilateral middle ear cavities well-aerated. Mild bilateral carotid siphon and vertebral artery atherosclerosis. IMPRESSION: 1. No acute intracranial abnormality. 2. Old lacunar stroke involving the inferior left basal ganglia. 3. Severe pansinusitis, likely acute on chronic. Critical Value/emergent results were called by telephone at the time of interpretation on 03/31/2015 at 7:35 pm to Dr. Nicole Kindred, who verbally acknowledged these results. Electronically Signed   By: Evangeline Dakin M.D.   On: 03/31/2015 19:38   Ct Angio Neck W/cm &/or Wo/cm  03/31/2015  CLINICAL DATA:  82 year old male code stroke presentation with slurred speech and left side weakness. Symptoms partially resolved. Initial encounter. EXAM: CT  ANGIOGRAPHY HEAD AND NECK TECHNIQUE: Multidetector CT imaging of the head and neck was performed using the standard protocol during bolus administration of intravenous contrast. Multiplanar CT image reconstructions and MIPs were obtained to evaluate the vascular anatomy. Carotid stenosis measurements (when applicable) are obtained utilizing NASCET criteria, using the distal internal carotid diameter as the denominator. CONTRAST:  26m OMNIPAQUE IOHEXOL 350 MG/ML SOLN COMPARISON:  Head CT without contrast 1920 hours today. FINDINGS: CTA NECK Skeleton: Subtotal paranasal sinus opacification including fluid levels in bubbly opacity in the frontal sinuses, posterior ethmoids, and sphenoid sinuses. Superimposed paranasal sinus periostitis. Tympanic cavities and mastoids are clear. Mild scoliosis and age appropriate degenerative changes in the spine. No acute osseous abnormality identified. Other neck: Negative lung apices. No superior mediastinal lymphadenopathy. Negative thyroid, larynx, pharynx, parapharyngeal spaces, retropharyngeal space, sublingual space, submandibular glands, and parotid glands. No cervical lymphadenopathy. Aortic arch: 3 vessel arch configuration. Very little calcified aortic plaque at the level of the great vessels. Moderate mostly calcified plaque in the distal arch distal to the left subclavian origin. No great vessel origin stenosis. Right carotid system: Minimal right CCA calcified plaque. Mild to moderate soft and calcified plaque at the right carotid bifurcation but less than 50 % stenosis with respect to the distal vessel at the right ICA origin and bulb. Negative cervical right ICA otherwise. Left carotid system: Mild left CCA calcified plaque without stenosis. Minimal calcified plaque at the left carotid bifurcation and bulb with no stenosis. Otherwise negative cervical left ICA. Vertebral arteries: No proximal subclavian artery stenosis. Calcified plaque at the right vertebral artery  origin with mild origin stenosis (series 403, image 111). Soft and calcified plaque at the left vertebral artery origin with moderate to severe stenosis (series 403, image 127 series 401, images 48 and 49). The right vertebral artery is dominant. Both vertebral arteries remain patent to the skullbase with no additional stenosis. CTA HEAD Posterior circulation: Dominant distal right vertebral artery. Both PICA origins are  normal and arise early. Bilateral calcified plaque in the vertebral artery V4 segments. Mild stenosis on the right and moderate to severe stenosis of the non dominant distal left vertebral artery. The left vertebral artery then is highly attenuated leading up to the vertebrobasilar junction. Normal patency of the right vertebrobasilar junction. No basilar artery stenosis. Normal SCA and PCA origins. Mild venous contamination about the left PCA. Bilateral PCA branches are within normal limits. Anterior circulation: Right ICA siphon is patent with mild to moderate calcified plaque. Up to mild supra clinoid right ICA stenosis. Normal right ophthalmic artery origin. Patent right ICA terminus. Normal right MCA and ACA origins. Right MCA M1 segment and bifurcation remain patent. There is a high-grade stenosis in a right M2 middle still the middle sylvian division best seen on series 406, image 8. The distal vessel remains patent (same series image 7). Other right MCA branches are within normal limits. Left ICA siphon is patent with moderate calcified plaque most pronounced in the supraclinoid segment. Mild to moderate left supraclinoid stenosis. Left ICA terminus is patent. Left MCA and ACA origins are normal. Anterior communicating artery is diminutive or absent. Mild bilateral ACA branch irregularity, no ACA branch occlusion identified. Left MCA M1 segment, bifurcation, and left MCA branches are within normal limits. Venous sinuses: Patent. Anatomic variants: Dominant right vertebral artery. IMPRESSION:  1. Negative for emergent large vessel occlusion, but positive for a right MCA M2 branch lesion best seen on series 406 image 7. There is a ~3 mm flow gap, but preserved distal flow. This was discussed by telephone with Dr. Wallie Char on 03/31/2015 at 0829 hours. 2. Mild for age anterior circulation atherosclerosis in the neck. Up to moderate ICA siphon calcified plaque with mild to moderate supraclinoid stenoses, greater on the left. 3. Nondominant left vertebral artery with moderate to severe stenosis at its origin and in the distal left V4 segment. 4. Dominant right vertebral artery with calcified plaque causing mild stenosis at its origin and right V4 segment. 5. Otherwise negative posterior circulation. 6. Acute on chronic pansinusitis. Electronically Signed   By: Genevie Ann M.D.   On: 03/31/2015 20:35    Assessment: 82 y.o. male with multiple risk factors for stroke, presenting with speech difficulty as well as left-sided weakness and numbness, which subsequently resolved. Deficits were probably manifestations of TIA. However, a small acute right MCA territory ischemic infarction cannot be ruled out at this point.  Stroke Risk Factors - atrial fibrillation, hyperlipidemia and hypertension  Plan: 1. HgbA1c, fasting lipid panel 2. MRI of the brain without contrast 3. PT consult, OT consult, Speech consult 4. Echocardiogram 5. Prophylactic therapy-Anticoagulation: Coumadin 6. Risk factor modification 7. Telemetry monitoring  C.R. Nicole Kindred, MD [facility] [phone]  03/31/2015, 8:50 PM

## 2015-03-31 NOTE — H&P (Signed)
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   His wife heard him fall to the floor, and observed his symptoms, and EMS was called.   He was brought in as a Code Stroke, and seen by Neurology Dr. Nicole Kindred.   A CT Scan of the Head was performed and was negative for acute findings.   His symptoms lasted 1.5 hours and then resolved,  He was referred for a TIA workup.       Review of Systems:  Constitutional: No Weight Loss, No Weight Gain, Night Sweats, Fevers, Chills, Dizziness, Light Headedness, Fatigue, or Generalized Weakness HEENT: No Headaches, Difficulty Swallowing,Tooth/Dental Problems,Sore Throat,  No Sneezing, Rhinitis, Ear Ache, Nasal Congestion, or Post Nasal Drip,  Cardio-vascular:  No Chest pain, Orthopnea, PND, Edema in Lower Extremities, Anasarca, Dizziness, Palpitations  Resp: No Dyspnea, No DOE, No Productive Cough, No Non-Productive Cough, No Hemoptysis, No Wheezing.    GI: No Heartburn, Indigestion, Abdominal Pain, Nausea, Vomiting, Diarrhea, Constipation, Hematemesis, Hematochezia, Melena, Change in Bowel Habits,  Loss of Appetite  GU: No Dysuria, No Change in Color of Urine, No Urgency or Urinary Frequency, No Flank pain.  Musculoskeletal: No Joint Pain or Swelling, No Decreased Range of Motion, No Back Pain.  Neurologic:  +Syncope, No Seizures, +Transient Left Arm Weakness, Paresthesia, Vision Disturbance or Loss, No Diplopia, No Vertigo, No Difficulty Walking,  Skin: No Rash or  Lesions. Psych: No Change in Mood or Affect, No Depression or Anxiety, No Memory loss, No Confusion, or Hallucinations   Past Medical History  Diagnosis Date  . CAD (coronary artery disease)   . Sinus bradycardia   . Anxiety   . Asthma   . Atrial fibrillation ([place])   . GERD (gastroesophageal reflux disease) 12/30/2000  . Hyperlipidemia   . Hypertension   . Allergic rhinitis   . Diverticulosis 03/16/1995  . Hiatal hernia 12/30/2000  . History of duodenal ulcer 10/15/1986  . Heart attack ([place]) 1984  . History of blood transfusion 1988    "related to GI bleeding OR"  . DJD (degenerative joint disease)     "most joints" (03/11/2015)  . Depression      Past Surgical History  Procedure Laterality Date  . Nasal septum surgery  1972  . Hospital ccu  1984    heart attack, hypoplastic right coronary  . Total knee arthroplasty Bilateral 1998-2004    "right-left"  . Coronary angioplasty with stent placement  2010  . Vagotomy and pyloroplasty  1988    "bleeding duodenal ulcer"  . Tonsillectomy    . Cholecystectomy open  1989  . Cardiac catheterization    . Inguinal hernia repair Right ~ 1990  . Cataract extraction w/ intraocular lens  implant, bilateral Bilateral ?2013      Prior to Admission medications   Medication Sig Start Date End Date Taking? Authorizing Provider  acetaminophen (TYLENOL) 325 MG tablet Take 2 tablets (650 mg total) by mouth every 4 (four) hours as needed for headache or mild  pain. 03/14/15  Yes Doreene Burke Kilroy, PA-C  atenolol (TENORMIN) 25 MG tablet Take 1-2 tablets (25-50 mg total) by mouth every evening. If pt wakes up in A-fib he takes an additional 25 mg Patient taking differently: Take 25 mg by mouth 2 (two) times daily.  03/14/15  Yes Luke K Kilroy, PA-C  atorvastatin (LIPITOR) 20 MG tablet Take 1 tablet (20 mg total) by mouth daily. Patient taking differently: Take 20 mg by mouth daily at 6 PM.  12/16/14  Yes Larey Dresser, MD  buPROPion [facility] SR) 150  MG 12 hr tablet Take 1 tablet (150 mg total) by mouth daily. 12/02/14  Yes [name], MD  cetirizine (ZYRTEC) 10 MG tablet Take 10 mg by mouth daily.   Yes Historical Provider, MD  Cholecalciferol (VITAMIN D3) 400 UNITS CAPS Take 400 Units by mouth daily.    Yes Historical Provider, MD  dofetilide (TIKOSYN) 250 MCG capsule Take 1 capsule (250 mcg total) by mouth 2 (two) times daily. 03/14/15  Yes Rhonda G Barrett, PA-C  fluticasone (FLONASE) 50 MCG/ACT nasal spray Place 2 sprays into both nostrils daily. 12/02/14  Yes [name], MD  fluticasone (FLOVENT HFA) 110 MCG/ACT inhaler Inhale 1 puff into the lungs 2 (two) times daily. 03/14/15  Yes Luke K Kilroy, PA-C  furosemide (LASIX) 20 MG tablet Take 20 mg by mouth every other day. 1 tablet my mouth every other day. 12/26/14  Yes Larey Dresser, MD  montelukast (SINGULAIR) 10 MG tablet TAKE 1 TABLET BY MOUTH ONCE DAILY Patient taking differently: Take 10 mg by mouth at bedtime. TAKE 1 TABLET BY MOUTH ONCE DAILY 12/02/14  Yes [name], MD  nitroGLYCERIN (NITROSTAT) 0.4 MG SL tablet Place 1 tablet (0.4 mg total) under the tongue every 5 (five) minutes x 3 doses as needed for chest pain. 03/14/15  Yes Erlene Quan, PA-C  Omega-3 Fatty Acids (FISH OIL) 1000 MG CAPS Take 1,000 mg by mouth daily.   Yes Historical Provider, MD  omeprazole (PRILOSEC OTC) 20 MG tablet Take 1 tablet (20 mg total) by mouth daily. 09/17/13  Yes [name], MD  PROAIR HFA 108 [phone] BASE) MCG/ACT inhaler INHALE 2 PUFFS INTO THE LUNGS EVERY 6 HOURS AS NEEDED FOR WHEEZING 01/24/15  Yes [name], MD  tamsulosin (FLOMAX) 0.4 MG CAPS capsule TAKE ONE CAPSULE BY MOUTH DAILY Patient taking differently: Take 0.4 mg by mouth daily after supper. TAKE ONE CAPSULE BY MOUTH DAILY 12/02/14  Yes [name], MD  warfarin (COUMADIN) 5 MG tablet TAKE AS DIRECTED PER COUMADIN CLINIC. 5 mg on Sun, Tues, and Thurs.  2.5 mg all other days Patient taking differently: Take 2.5-5 mg by  mouth daily at 6 PM. 5 mg on Sun, Tues, and Thurs.   2.5 mg all other days 03/14/15  Yes Erlene Quan, PA-C     No Known Allergies    Social History:  reports that he has never smoked. He has never used smokeless tobacco. He reports that he does not drink alcohol or use illicit drugs.    Family History  Problem Relation Age of Onset  . Coronary artery disease Mother     deceased  . Deep vein thrombosis Sister   . Coronary artery disease Brother   . Asthma Brother   . Colon cancer Neg Hx        Physical Exam:  GEN:  Pleasant Well Nourished and Well Developed Elderly 82 y.o. Caucasian male  examined and in no acute distress; cooperative with exam Filed Vitals:   03/31/15 1937 03/31/15 1938 03/31/15 2015  BP: 156/72  171/78  Pulse: 71  73  Temp: 96.2 F (35.7 C)    TempSrc: Axillary    Resp: 13  24  Height:  5\' 10"  (1.778 m)   Weight:  76.7 kg (169 lb 1.5 oz)   SpO2: 100%  96%   Blood pressure 171/78, pulse 73, temperature 96.2 F (35.7 C), temperature source Axillary, resp. rate 24, height 5\' 10"  (1.778 m), weight 76.7 kg (169 lb 1.5 oz), SpO2 96 %. PSYCH: He is alert and oriented x4; does not appear anxious does not appear depressed; affect is normal HEENT: Normocephalic and Atraumatic, Mucous membranes pink; PERRLA; EOM intact; Fundi:  Benign;  No scleral icterus, Nares: Patent, Oropharynx: Clear, Fair Dentition,    Neck:  FROM, No Cervical Lymphadenopathy nor Thyromegaly or Carotid Bruit; No JVD; Breasts:: Not examined CHEST WALL: No tenderness CHEST: Normal respiration, clear to auscultation bilaterally HEART: Regular rate and rhythm; no murmurs rubs or gallops BACK: No kyphosis or scoliosis; No CVA tenderness ABDOMEN: Positive Bowel Sounds, Scaphoid, Soft Non-Tender, No Rebound or Guarding; No Masses, No Organomegaly, Rectal Exam: Not done EXTREMITIES: No Cyanosis, Clubbing, or Edema; No Ulcerations. Genitalia: not examined PULSES: 2+ and symmetric SKIN: Normal  hydration no rash or ulceration  CNS:  Alert and Oriented x 4, No Focal Deficits Mental Status:  Alert, Oriented, Thought Content Appropriate. Speech Fluent without evidence of Aphasia. Able to follow 3 step commands without difficulty.  In No obvious pain.   Cranial Nerves:  II: Discs flat bilaterally; Visual fields Intact, Pupils equal and reactive.    III,IV, VI: Extra-ocular motions intact bilaterally    V,VII: smile symmetric, facial light touch sensation normal bilaterally    VIII: hearing intact bilaterally    IX,X: gag reflex present    XI: bilateral shoulder shrug    XII: midline tongue extension   Motor:  Right:  Upper extremity 5/5     Left:  Upper extremity 5/5     Right:  Lower extremity 5/5    Left:  Lower extremity 5/5     Tone and Bulk:  normal tone throughout; no atrophy noted   Sensory:  Pinprick and light touch intact throughout, bilaterally   Deep Tendon Reflexes: 2+ and symmetric throughout   Plantars/ Babinski:  Right:  normal Left:normal    Cerebellar:  Finger to nose without difficulty.   Gait: deferred    Vascular: pulses palpable throughout    Labs on Admission:  Basic Metabolic Panel:  Recent Labs Lab 03/31/15 1930 03/31/15 1940  NA 134* 137  K 4.5 4.6  CL 102 102  CO2 23  --   GLUCOSE 174* 175*  BUN 31* 37*  CREATININE 1.53* 1.40*  CALCIUM 8.9  --    Liver Function Tests:  Recent Labs Lab 03/31/15 1930  AST 20  ALT 16*  ALKPHOS 82  BILITOT 0.4  PROT 6.5  ALBUMIN 3.6   No results for input(s): LIPASE, AMYLASE in the last 168 hours. No results for input(s): AMMONIA in the last 168 hours. CBC:  Recent Labs Lab 03/31/15 1930 03/31/15 1940  WBC 9.3  --   NEUTROABS 6.4  --   HGB 14.0 15.6  HCT 43.9 46.0  MCV 92.8  --   PLT 228  --    Cardiac Enzymes: No results for input(s): CKTOTAL, CKMB, CKMBINDEX, TROPONINI in the last 168 hours.  BNP (last 3 results) No results for input(s): BNP in the last 8760 hours.  ProBNP  (last 3 results) No results for input(s): PROBNP in the last 8760 hours.  CBG:  Recent Labs Lab 03/31/15 1941  GLUCAP 141*    Radiological Exams on Admission: Ct Angio Head W/cm &/or Wo Cm  03/31/2015  CLINICAL DATA:  82 year old male code stroke presentation with slurred speech and left side weakness. Symptoms partially resolved. Initial encounter. EXAM: CT ANGIOGRAPHY HEAD AND NECK TECHNIQUE: Multidetector CT imaging of the head and neck was performed using the standard protocol during bolus administration of intravenous contrast. Multiplanar CT image reconstructions and MIPs were obtained to evaluate the vascular anatomy. Carotid stenosis measurements (when applicable) are obtained utilizing NASCET criteria, using the distal internal carotid diameter as the denominator. CONTRAST:  50mL OMNIPAQUE IOHEXOL 350 MG/ML SOLN COMPARISON:  Head CT without contrast 1920 hours today. FINDINGS: CTA NECK Skeleton: Subtotal paranasal sinus opacification including fluid levels in bubbly opacity in the frontal sinuses, posterior ethmoids, and sphenoid sinuses. Superimposed paranasal sinus periostitis. Tympanic cavities and mastoids are clear. Mild scoliosis and age appropriate degenerative changes in the spine. No acute osseous abnormality identified. Other neck: Negative lung apices. No superior mediastinal lymphadenopathy. Negative thyroid, larynx, pharynx, parapharyngeal spaces, retropharyngeal space, sublingual space, submandibular glands, and parotid glands. No cervical lymphadenopathy. Aortic arch: 3 vessel arch configuration. Very little calcified aortic plaque at the level of the great vessels. Moderate mostly calcified plaque in the distal arch distal to the left subclavian origin. No great vessel origin stenosis. Right carotid system: Minimal right CCA calcified plaque. Mild to moderate soft and calcified plaque at the right carotid bifurcation but less than 50 % stenosis with respect to the distal vessel  at the right ICA origin and bulb. Negative cervical right ICA otherwise. Left carotid system: Mild left CCA calcified plaque without stenosis. Minimal calcified plaque at the left carotid bifurcation and bulb with no stenosis. Otherwise negative cervical left ICA. Vertebral arteries: No proximal subclavian artery stenosis. Calcified plaque at the right vertebral artery origin with mild origin stenosis (series 403, image 111). Soft and calcified plaque at the left vertebral artery origin with moderate to severe stenosis (series 403, image 127 series 401, images 48 and 49). The right vertebral artery is dominant. Both vertebral arteries remain patent to the skullbase with no additional stenosis. CTA HEAD Posterior circulation: Dominant distal right vertebral artery. Both PICA origins are normal and arise early. Bilateral calcified plaque in the vertebral artery V4 segments. Mild stenosis on the right and moderate to severe stenosis of the non dominant distal left vertebral artery. The left vertebral artery then is highly attenuated leading up to the vertebrobasilar junction. Normal patency of the right vertebrobasilar junction. No basilar artery stenosis. Normal SCA and PCA origins. Mild venous contamination about the left PCA. Bilateral PCA branches are within normal limits. Anterior circulation: Right ICA siphon is patent with mild to moderate calcified plaque. Up to mild supra clinoid right ICA stenosis. Normal right ophthalmic artery origin. Patent right ICA terminus. Normal right MCA and ACA origins. Right MCA M1 segment and bifurcation remain patent. There is a high-grade stenosis in a right M2 middle still the middle sylvian division best seen on series 406, image 8. The distal vessel remains patent (same series image 7). Other right MCA branches are within normal limits. Left ICA siphon is patent with moderate calcified plaque most pronounced in the supraclinoid segment. Mild to moderate left supraclinoid  stenosis. Left ICA  terminus is patent. Left MCA and ACA origins are normal. Anterior communicating artery is diminutive or absent. Mild bilateral ACA branch irregularity, no ACA branch occlusion identified. Left MCA M1 segment, bifurcation, and left MCA branches are within normal limits. Venous sinuses: Patent. Anatomic variants: Dominant right vertebral artery. IMPRESSION: 1. Negative for emergent large vessel occlusion, but positive for a right MCA M2 branch lesion best seen on series 406 image 7. There is a ~3 mm flow gap, but preserved distal flow. This was discussed by telephone with Dr. Wallie Char on 03/31/2015 at 0829 hours. 2. Mild for age anterior circulation atherosclerosis in the neck. Up to moderate ICA siphon calcified plaque with mild to moderate supraclinoid stenoses, greater on the left. 3. Nondominant left vertebral artery with moderate to severe stenosis at its origin and in the distal left V4 segment. 4. Dominant right vertebral artery with calcified plaque causing mild stenosis at its origin and right V4 segment. 5. Otherwise negative posterior circulation. 6. Acute on chronic pansinusitis. Electronically Signed   By: Genevie Ann M.D.   On: 03/31/2015 20:35   Ct Head Wo Contrast  03/31/2015  CLINICAL DATA:  82 year old with acute onset of slurred speech approximately 6:30 p.m. this evening. Current history of atrial fibrillation. EXAM: CT HEAD WITHOUT CONTRAST TECHNIQUE: Contiguous axial images were obtained from the base of the skull through the vertex without intravenous contrast. COMPARISON:  None. FINDINGS: Ventricular system normal in size and appearance for age. Mild age related cortical atrophy. Old lacunar stroke in the inferior left basal ganglia. No mass lesion. No midline shift. No acute hemorrhage or hematoma. No extra-axial fluid collections. No evidence of acute infarction. No skull fracture or other focal osseous abnormality involving the skull. Near complete opacification of  all of the visualized paranasal sinuses. Bilateral mastoid air cells and bilateral middle ear cavities well-aerated. Mild bilateral carotid siphon and vertebral artery atherosclerosis. IMPRESSION: 1. No acute intracranial abnormality. 2. Old lacunar stroke involving the inferior left basal ganglia. 3. Severe pansinusitis, likely acute on chronic. Critical Value/emergent results were called by telephone at the time of interpretation on 03/31/2015 at 7:35 pm to Dr. Nicole Kindred, who verbally acknowledged these results. Electronically Signed   By: Evangeline Dakin M.D.   On: 03/31/2015 19:38   Ct Angio Neck W/cm &/or Wo/cm  03/31/2015  CLINICAL DATA:  82 year old male code stroke presentation with slurred speech and left side weakness. Symptoms partially resolved. Initial encounter. EXAM: CT ANGIOGRAPHY HEAD AND NECK TECHNIQUE: Multidetector CT imaging of the head and neck was performed using the standard protocol during bolus administration of intravenous contrast. Multiplanar CT image reconstructions and MIPs were obtained to evaluate the vascular anatomy. Carotid stenosis measurements (when applicable) are obtained utilizing NASCET criteria, using the distal internal carotid diameter as the denominator. CONTRAST:  42mL OMNIPAQUE IOHEXOL 350 MG/ML SOLN COMPARISON:  Head CT without contrast 1920 hours today. FINDINGS: CTA NECK Skeleton: Subtotal paranasal sinus opacification including fluid levels in bubbly opacity in the frontal sinuses, posterior ethmoids, and sphenoid sinuses. Superimposed paranasal sinus periostitis. Tympanic cavities and mastoids are clear. Mild scoliosis and age appropriate degenerative changes in the spine. No acute osseous abnormality identified. Other neck: Negative lung apices. No superior mediastinal lymphadenopathy. Negative thyroid, larynx, pharynx, parapharyngeal spaces, retropharyngeal space, sublingual space, submandibular glands, and parotid glands. No cervical lymphadenopathy. Aortic  arch: 3 vessel arch configuration. Very little calcified aortic plaque at the level of the great vessels. Moderate mostly calcified plaque in the distal arch distal to the  left subclavian origin. No great vessel origin stenosis. Right carotid system: Minimal right CCA calcified plaque. Mild to moderate soft and calcified plaque at the right carotid bifurcation but less than 50 % stenosis with respect to the distal vessel at the right ICA origin and bulb. Negative cervical right ICA otherwise. Left carotid system: Mild left CCA calcified plaque without stenosis. Minimal calcified plaque at the left carotid bifurcation and bulb with no stenosis. Otherwise negative cervical left ICA. Vertebral arteries: No proximal subclavian artery stenosis. Calcified plaque at the right vertebral artery origin with mild origin stenosis (series 403, image 111). Soft and calcified plaque at the left vertebral artery origin with moderate to severe stenosis (series 403, image 127 series 401, images 48 and 49). The right vertebral artery is dominant. Both vertebral arteries remain patent to the skullbase with no additional stenosis. CTA HEAD Posterior circulation: Dominant distal right vertebral artery. Both PICA origins are normal and arise early. Bilateral calcified plaque in the vertebral artery V4 segments. Mild stenosis on the right and moderate to severe stenosis of the non dominant distal left vertebral artery. The left vertebral artery then is highly attenuated leading up to the vertebrobasilar junction. Normal patency of the right vertebrobasilar junction. No basilar artery stenosis. Normal SCA and PCA origins. Mild venous contamination about the left PCA. Bilateral PCA branches are within normal limits. Anterior circulation: Right ICA siphon is patent with mild to moderate calcified plaque. Up to mild supra clinoid right ICA stenosis. Normal right ophthalmic artery origin. Patent right ICA terminus. Normal right MCA and ACA  origins. Right MCA M1 segment and bifurcation remain patent. There is a high-grade stenosis in a right M2 middle still the middle sylvian division best seen on series 406, image 8. The distal vessel remains patent (same series image 7). Other right MCA branches are within normal limits. Left ICA siphon is patent with moderate calcified plaque most pronounced in the supraclinoid segment. Mild to moderate left supraclinoid stenosis. Left ICA terminus is patent. Left MCA and ACA origins are normal. Anterior communicating artery is diminutive or absent. Mild bilateral ACA branch irregularity, no ACA branch occlusion identified. Left MCA M1 segment, bifurcation, and left MCA branches are within normal limits. Venous sinuses: Patent. Anatomic variants: Dominant right vertebral artery. IMPRESSION: 1. Negative for emergent large vessel occlusion, but positive for a right MCA M2 branch lesion best seen on series 406 image 7. There is a ~3 mm flow gap, but preserved distal flow. This was discussed by telephone with Dr. Wallie Char on 03/31/2015 at 0829 hours. 2. Mild for age anterior circulation atherosclerosis in the neck. Up to moderate ICA siphon calcified plaque with mild to moderate supraclinoid stenoses, greater on the left. 3. Nondominant left vertebral artery with moderate to severe stenosis at its origin and in the distal left V4 segment. 4. Dominant right vertebral artery with calcified plaque causing mild stenosis at its origin and right V4 segment. 5. Otherwise negative posterior circulation. 6. Acute on chronic pansinusitis. Electronically Signed   By: Genevie Ann M.D.   On: 03/31/2015 20:35     EKG: Independently reviewed. Normal Sinus Rhythm Rate = 67    Assessment/Plan:   82 y.o. male with  Principal Problem:    TIA (transient ischemic attack)    TIA Workup    Cardiac Monitoring    MRI/MRA of Brain ordered    Check Fasting Lipids, and HbA1C in AM    Has had 2D ECHO     Seen  By Neuro in  ED       Active Problems:     Syncope and collapse    Cardiac Monitoring     Check Orthostatics       Paroxysmal atrial fibrillation (HCC)    Contnue Atenolol, and     Continue Coumadin Rx   Long term current use of Coumadin    Continue Coumadin Rx    Monitor PT/INR daily with Pharmacy Adjustment       Hyperlipidemia    Cotninue Atorvastatin and Omega 3 Fatty Acids    Check Fasting Lipids in AM      Essential hypertension    Continue atenolol, LAsix Rx    Monitor BPs      CAD S/P OM PCI [id number]      Diastolic dysfunction   Stable   Continue Lasix q OD, and Atenolol Rx   Last 2D ECHO in [facility] was on 08/2014   DVT Prophylaxis   On Coumadin Rx    Code Status:     FULL CODE       Family Communication:   Wife and Family at Bedside    Disposition Plan:   Observation Status        Time spent: [address] [place] [address] [facility] Pager [phone]   If [place] Please Contact the Day Rounding Team MD for [facility]  If 7PM-7AM, Please Contact Night-Floor Coverage  [email or web address] Password [email or web address] 03/31/2015, 8:59 PM     ADDENDUM:   Patient was seen and examined on 03/31/2015

## 2015-03-31 NOTE — ED Notes (Signed)
Pt to CT with Stroke RN and ED RN and Neurologist

## 2015-03-31 NOTE — ED Notes (Signed)
Pt presents to ED from home via [facility] for LEFT sided weakness and LEFT sided facial droop; Family reports LSN was 18:15 when he got up from watching TV to go to the bathroom to take night doses of medications, family heard a thump, and found patient in floor and patient was unresponsive; pt then became more arousable and family noted slurred speech and facial droop and weakness; pt reports hx of Afib and use of coumadin

## 2015-03-31 NOTE — Progress Notes (Signed)
ANTICOAGULATION CONSULT NOTE - Initial Consult  Pharmacy Consult for Warfarin Indication: atrial fibrillation  No Known Allergies  Patient Measurements: Height: 5\' 10"  (177.8 cm) Weight: 169 lb 1.5 oz (76.7 kg) IBW/kg (Calculated) : 73  Vital Signs: Temp: 96.2 F (35.7 C) (02/20 1937) Temp Source: Axillary (02/20 1937) BP: 139/75 mmHg (02/20 2115) Pulse Rate: 67 (02/20 2115)  Labs:  Recent Labs  03/31/15 1930 03/31/15 1940  HGB 14.0 15.6  HCT 43.9 46.0  PLT 228  --   APTT 31  --   LABPROT 21.8*  --   INR 1.91*  --   CREATININE 1.53* 1.40*    Estimated Creatinine Clearance: 41.3 mL/min (by C-G formula based on Cr of 1.4).   Medical History: Past Medical History  Diagnosis Date  . CAD (coronary artery disease)   . Sinus bradycardia   . Anxiety   . Asthma   . Atrial fibrillation ([place])   . GERD (gastroesophageal reflux disease) 12/30/2000  . Hyperlipidemia   . Hypertension   . Allergic rhinitis   . Diverticulosis 03/16/1995  . Hiatal hernia 12/30/2000  . History of duodenal ulcer 10/15/1986  . Heart attack ([place]) 1984  . History of blood transfusion 1988    "related to GI bleeding OR"  . DJD (degenerative joint disease)     "most joints" (03/11/2015)  . Depression     Assessment: 21 YOM who presented to the [facility] on 2/20 as a CODE STROKE. The patient was noted to be on warfarin PTA for history of Afib. Given the patient's admit INR of 1.9 and improving symptoms - no tPA was given. Pharmacy was consulted to resume warfarin dosing.  PTA the patient was taking 2.5 mg daily EXCEPT for 5 mg on Tues/Thurs/Sun. The patient's last dose was earlier today and was 2.5 mg. The patient's INR was SUBtherapeutic on admit (INR 1.91, goal of 2-3). CBC was wnl - no bleeding noted. Will give an additional dose tonight to attempt to get INR back in goal range.   Goal of Therapy:  INR 2-3   Plan:  1. Warfarin 2.5 mg x 1 dose now (in addition to patient's home dose of 2.5 mg for a  total dose today of 5 mg) 2. Daily PT/INR 3. Will continue to monitor for any signs/symptoms of bleeding and will follow up with PT/INR in the a.m.   Alycia Rossetti, PharmD, BCPS Clinical Pharmacist Pager: [phone] 03/31/2015 9:34 PM

## 2015-03-31 NOTE — ED Provider Notes (Signed)
CSN: [id number]     Arrival date & time 03/31/15  1910 History   First MD Initiated Contact with Patient 03/31/15 1913     Chief Complaint  Patient presents with  . Code Stroke     The history is provided by the patient.  patient presents with difficulty speaking and had left side weakness. Last normal at 615 pm. Immediately met upon arrival by Dr. Ralene Bathe and met in [place] by myself and Dr. Nicole Kindred. Reportedly found afterwards after his wife on on the ground. He is on Coumadin for atrial fibrillation. He reportedly went golfing today. He is able to raise his left arm up. Had apparently been able to move both lower legs also. No headache. No confusion.  Past Medical History  Diagnosis Date  . CAD (coronary artery disease)   . Sinus bradycardia   . Anxiety   . Asthma   . Atrial fibrillation ([place])   . GERD (gastroesophageal reflux disease) 12/30/2000  . Hyperlipidemia   . Hypertension   . Allergic rhinitis   . Diverticulosis 03/16/1995  . Hiatal hernia 12/30/2000  . History of duodenal ulcer 10/15/1986  . Heart attack ([place]) 1984  . History of blood transfusion 1988    "related to GI bleeding OR"  . DJD (degenerative joint disease)     "most joints" (03/11/2015)  . Depression    Past Surgical History  Procedure Laterality Date  . Nasal septum surgery  1972  . Hospital ccu  1984    heart attack, hypoplastic right coronary  . Total knee arthroplasty Bilateral 1998-2004    "right-left"  . Coronary angioplasty with stent placement  2010  . Vagotomy and pyloroplasty  1988    "bleeding duodenal ulcer"  . Tonsillectomy    . Cholecystectomy open  1989  . Cardiac catheterization    . Inguinal hernia repair Right ~ 1990  . Cataract extraction w/ intraocular lens  implant, bilateral Bilateral ?2013   Family History  Problem Relation Age of Onset  . Coronary artery disease Mother     deceased  . Deep vein thrombosis Sister   . Coronary artery disease Brother   . Asthma Brother   . Colon  cancer Neg Hx    Social History  Substance Use Topics  . Smoking status: Never Smoker   . Smokeless tobacco: Never Used  . Alcohol Use: No    Review of Systems  Constitutional: Negative for activity change.  Respiratory: Negative for shortness of breath.   Cardiovascular: Negative for chest pain.  Gastrointestinal: Negative for abdominal pain.  Genitourinary: Negative for flank pain.  Musculoskeletal: Negative for back pain.  Skin: Negative for pallor.  Neurological: Positive for speech difficulty and weakness. Negative for light-headedness and headaches.  Psychiatric/Behavioral: Negative for behavioral problems.      Allergies  Review of patient's allergies indicates no known allergies.  Home Medications   Prior to Admission medications   Medication Sig Start Date End Date Taking? Authorizing Provider  acetaminophen (TYLENOL) 325 MG tablet Take 2 tablets (650 mg total) by mouth every 4 (four) hours as needed for headache or mild pain. 03/14/15  Yes Luke K Kilroy, PA-C  atenolol (TENORMIN) 25 MG tablet Take 1-2 tablets (25-50 mg total) by mouth every evening. If pt wakes up in A-fib he takes an additional 25 mg 03/14/15  Yes Erlene Quan, PA-C  atorvastatin (LIPITOR) 20 MG tablet Take 1 tablet (20 mg total) by mouth daily. 12/16/14  Yes Larey Dresser, MD  buPROPion (WELLBUTRIN SR) 150 MG 12 hr tablet Take 1 tablet (150 mg total) by mouth daily. 12/02/14  Yes [name], MD  cetirizine (ZYRTEC) 10 MG tablet Take 10 mg by mouth daily.   Yes Historical Provider, MD  Cholecalciferol (VITAMIN D3) 400 UNITS CAPS Take 400 Units by mouth daily.    Yes Historical Provider, MD  dofetilide (TIKOSYN) 250 MCG capsule Take 1 capsule (250 mcg total) by mouth 2 (two) times daily. 03/14/15  Yes Rhonda G Barrett, PA-C  fluticasone (FLONASE) 50 MCG/ACT nasal spray Place 2 sprays into both nostrils daily. 12/02/14  Yes [name], MD  fluticasone (FLOVENT HFA) 110 MCG/ACT inhaler Inhale 1 puff  into the lungs 2 (two) times daily. 03/14/15  Yes Luke K Kilroy, PA-C  furosemide (LASIX) 20 MG tablet Take 20 mg by mouth every other day. 1 tablet my mouth every other day. 12/26/14  Yes Larey Dresser, MD  montelukast (SINGULAIR) 10 MG tablet TAKE 1 TABLET BY MOUTH ONCE DAILY Patient taking differently: Take 10 mg by mouth daily. TAKE 1 TABLET BY MOUTH ONCE DAILY 12/02/14  Yes [name], MD  nitroGLYCERIN (NITROSTAT) 0.4 MG SL tablet Place 1 tablet (0.4 mg total) under the tongue every 5 (five) minutes x 3 doses as needed for chest pain. 03/14/15  Yes Erlene Quan, PA-C  Omega-3 Fatty Acids (FISH OIL) 1000 MG CAPS Take 1,000 mg by mouth daily.   Yes Historical Provider, MD  omeprazole (PRILOSEC OTC) 20 MG tablet Take 1 tablet (20 mg total) by mouth daily. 09/17/13  Yes [name], MD  PROAIR HFA 108 [phone] BASE) MCG/ACT inhaler INHALE 2 PUFFS INTO THE LUNGS EVERY 6 HOURS AS NEEDED FOR WHEEZING 01/24/15  Yes [name], MD  tamsulosin (FLOMAX) 0.4 MG CAPS capsule TAKE ONE CAPSULE BY MOUTH DAILY 12/02/14  Yes [name], MD  warfarin (COUMADIN) 5 MG tablet TAKE AS DIRECTED PER COUMADIN CLINIC. 5 mg on Sun, Tues, and Thurs.  2.5 mg all other days Patient taking differently: Take 2.5-5 mg by mouth daily at 6 PM. 5 mg on Sun, Tues, and Thurs.   2.5 mg all other days 03/14/15  Yes Luke K Kilroy, PA-C   BP 171/78 mmHg  Pulse 73  Temp(Src) 96.2 F (35.7 C) (Axillary)  Resp 24  Ht '5\' 10"'  (1.778 m)  Wt 169 lb 1.5 oz (76.7 kg)  BMI 24.26 kg/m2  SpO2 96% Physical Exam  Constitutional: He appears well-developed.  HENT:  Head: Atraumatic.  Eyes: Pupils are equal, round, and reactive to light.  Neck: Neck supple.  Cardiovascular: Normal rate.   Abdominal: There is no tenderness.  Neurological:  Slurred speech with left-sided facial droop. Good grip strength bilaterally. Good straight leg raise and straight arm raise bilaterally. Complete NIH scoring done by neurology. Does have slurred  speech but appears appropriate.  Skin: Skin is warm.  Psychiatric: His behavior is normal.    ED Course  Procedures (including critical care time) Labs Review Labs Reviewed  PROTIME-INR - Abnormal; Notable for the following:    Prothrombin Time 21.8 (*)    INR 1.91 (*)    All other components within normal limits  COMPREHENSIVE METABOLIC PANEL - Abnormal; Notable for the following:    Sodium 134 (*)    Glucose, Bld 174 (*)    BUN 31 (*)    Creatinine, Ser 1.53 (*)    ALT 16 (*)    GFR calc non Af Wyvonnia Lora  40 (*)    GFR calc Af Amer 47 (*)    All other components within normal limits  I-STAT CHEM 8, ED - Abnormal; Notable for the following:    BUN 37 (*)    Creatinine, Ser 1.40 (*)    Glucose, Bld 175 (*)    Calcium, Ion 1.08 (*)    All other components within normal limits  CBG MONITORING, ED - Abnormal; Notable for the following:    Glucose-Capillary 141 (*)    All other components within normal limits  ETHANOL  APTT  CBC  DIFFERENTIAL  URINE RAPID DRUG SCREEN, HOSP PERFORMED  URINALYSIS, ROUTINE W REFLEX MICROSCOPIC (NOT AT [facility])  I-STAT TROPOININ, ED    Imaging Review Ct Head Wo Contrast  03/31/2015  CLINICAL DATA:  82 year old with acute onset of slurred speech approximately 6:30 p.m. this evening. Current history of atrial fibrillation. EXAM: CT HEAD WITHOUT CONTRAST TECHNIQUE: Contiguous axial images were obtained from the base of the skull through the vertex without intravenous contrast. COMPARISON:  None. FINDINGS: Ventricular system normal in size and appearance for age. Mild age related cortical atrophy. Old lacunar stroke in the inferior left basal ganglia. No mass lesion. No midline shift. No acute hemorrhage or hematoma. No extra-axial fluid collections. No evidence of acute infarction. No skull fracture or other focal osseous abnormality involving the skull. Near complete opacification of all of the visualized paranasal sinuses. Bilateral mastoid air cells and  bilateral middle ear cavities well-aerated. Mild bilateral carotid siphon and vertebral artery atherosclerosis. IMPRESSION: 1. No acute intracranial abnormality. 2. Old lacunar stroke involving the inferior left basal ganglia. 3. Severe pansinusitis, likely acute on chronic. Critical Value/emergent results were called by telephone at the time of interpretation on 03/31/2015 at 7:35 pm to Dr. Nicole Kindred, who verbally acknowledged these results. Electronically Signed   By: Evangeline Dakin M.D.   On: 03/31/2015 19:38   I have personally reviewed and evaluated these images and lab results as part of my medical decision-making.   EKG Interpretation None      MDM   Final diagnoses:  Transient cerebral ischemia, unspecified transient cerebral ischemia type    Patient presented with neurologic deficits. Symptoms about improving of resolved upon reexamination. Did have difficulty speaking left-sided weakness and likely left-sided neglect. CTA reassuring with no large vessel occlusion. Will admit to hospitalist. Has been seen by neurology.    Davonna Belling, MD 03/31/15 2029

## 2015-03-31 NOTE — Progress Notes (Signed)
Code Stroke called on 82 y.o male. Pertinent history of atrial fibrillation on Coumadin, hypertension, hyperlipidemia, coronary artery disease and degenerative joint disease. LSN 1815. Per wife Patient went to bathroom and she heard a thump shortly after. Pt found on the floor unresponsive. Pt quickly became arousable Wife noted slurred speech and left facial droop left arm weakness. EMS called and Pt brought to [facility]. Pt taken to CT scan STAT, reviewed per Neurologist Dr. Nicole Kindred as negative for acute findings. NIHSS and labs completed upon arrival back to ED room. NIHSS 4 for slurred speech, sensory deficit on left arm only, and mild facial droop. CTA completed and reviewed per Dr.Stewart, see note. After awaiting INR results, it was decided not to give TPA as his INR is 1.9 and his symptoms are rapidly improving. Pt for admit and full stroke work up.

## 2015-04-01 ENCOUNTER — Observation Stay (HOSPITAL_COMMUNITY): Payer: Medicare Other

## 2015-04-01 DIAGNOSIS — G8194 Hemiplegia, unspecified affecting left nondominant side: Secondary | ICD-10-CM | POA: Diagnosis present

## 2015-04-01 DIAGNOSIS — I480 Paroxysmal atrial fibrillation: Secondary | ICD-10-CM | POA: Diagnosis not present

## 2015-04-01 DIAGNOSIS — R4781 Slurred speech: Secondary | ICD-10-CM | POA: Diagnosis not present

## 2015-04-01 DIAGNOSIS — I639 Cerebral infarction, unspecified: Secondary | ICD-10-CM | POA: Diagnosis present

## 2015-04-01 DIAGNOSIS — J45909 Unspecified asthma, uncomplicated: Secondary | ICD-10-CM | POA: Diagnosis present

## 2015-04-01 DIAGNOSIS — F329 Major depressive disorder, single episode, unspecified: Secondary | ICD-10-CM | POA: Diagnosis present

## 2015-04-01 DIAGNOSIS — I619 Nontraumatic intracerebral hemorrhage, unspecified: Secondary | ICD-10-CM | POA: Diagnosis not present

## 2015-04-01 DIAGNOSIS — Z7901 Long term (current) use of anticoagulants: Secondary | ICD-10-CM | POA: Diagnosis not present

## 2015-04-01 DIAGNOSIS — I5189 Other ill-defined heart diseases: Secondary | ICD-10-CM | POA: Diagnosis present

## 2015-04-01 DIAGNOSIS — J309 Allergic rhinitis, unspecified: Secondary | ICD-10-CM | POA: Diagnosis present

## 2015-04-01 DIAGNOSIS — I519 Heart disease, unspecified: Secondary | ICD-10-CM | POA: Diagnosis not present

## 2015-04-01 DIAGNOSIS — T508X5A Adverse effect of diagnostic agents, initial encounter: Secondary | ICD-10-CM | POA: Diagnosis not present

## 2015-04-01 DIAGNOSIS — G459 Transient cerebral ischemic attack, unspecified: Secondary | ICD-10-CM | POA: Diagnosis not present

## 2015-04-01 DIAGNOSIS — I4819 Other persistent atrial fibrillation: Secondary | ICD-10-CM

## 2015-04-01 DIAGNOSIS — E785 Hyperlipidemia, unspecified: Secondary | ICD-10-CM | POA: Diagnosis present

## 2015-04-01 DIAGNOSIS — F419 Anxiety disorder, unspecified: Secondary | ICD-10-CM | POA: Diagnosis present

## 2015-04-01 DIAGNOSIS — I481 Persistent atrial fibrillation: Secondary | ICD-10-CM | POA: Diagnosis not present

## 2015-04-01 DIAGNOSIS — L500 Allergic urticaria: Secondary | ICD-10-CM | POA: Diagnosis not present

## 2015-04-01 DIAGNOSIS — I611 Nontraumatic intracerebral hemorrhage in hemisphere, cortical: Secondary | ICD-10-CM | POA: Diagnosis present

## 2015-04-01 DIAGNOSIS — Z96653 Presence of artificial knee joint, bilateral: Secondary | ICD-10-CM | POA: Diagnosis present

## 2015-04-01 DIAGNOSIS — I612 Nontraumatic intracerebral hemorrhage in hemisphere, unspecified: Secondary | ICD-10-CM

## 2015-04-01 DIAGNOSIS — I252 Old myocardial infarction: Secondary | ICD-10-CM | POA: Diagnosis not present

## 2015-04-01 DIAGNOSIS — I2510 Atherosclerotic heart disease of native coronary artery without angina pectoris: Secondary | ICD-10-CM | POA: Diagnosis present

## 2015-04-01 DIAGNOSIS — Y92230 Patient room in hospital as the place of occurrence of the external cause: Secondary | ICD-10-CM | POA: Diagnosis not present

## 2015-04-01 DIAGNOSIS — I10 Essential (primary) hypertension: Secondary | ICD-10-CM

## 2015-04-01 DIAGNOSIS — R55 Syncope and collapse: Secondary | ICD-10-CM | POA: Diagnosis not present

## 2015-04-01 DIAGNOSIS — I638 Other cerebral infarction: Secondary | ICD-10-CM | POA: Diagnosis not present

## 2015-04-01 DIAGNOSIS — T39015A Adverse effect of aspirin, initial encounter: Secondary | ICD-10-CM | POA: Diagnosis not present

## 2015-04-01 DIAGNOSIS — Z955 Presence of coronary angioplasty implant and graft: Secondary | ICD-10-CM | POA: Diagnosis not present

## 2015-04-01 DIAGNOSIS — K219 Gastro-esophageal reflux disease without esophagitis: Secondary | ICD-10-CM | POA: Diagnosis present

## 2015-04-01 DIAGNOSIS — R29704 NIHSS score 4: Secondary | ICD-10-CM | POA: Diagnosis present

## 2015-04-01 DIAGNOSIS — T7840XA Allergy, unspecified, initial encounter: Secondary | ICD-10-CM

## 2015-04-01 LAB — LIPID PANEL
Cholesterol: 78 mg/dL (ref 0–200)
HDL: 28 mg/dL — AB (ref >40)
LDL CALC: 32 mg/dL (ref 0–99)
TRIGLYCERIDES: 91 mg/dL (ref <150)
Total CHOL/HDL Ratio: 2.8 RATIO
VLDL: 18 mg/dL (ref 0–40)

## 2015-04-01 LAB — PROTIME-INR
INR: 2 — ABNORMAL HIGH (ref 0.00–1.49)
Prothrombin Time: 22.6 seconds — ABNORMAL HIGH (ref 11.6–15.2)

## 2015-04-01 MED ORDER — LORAZEPAM 0.5 MG PO TABS
0.5000 mg | ORAL_TABLET | Freq: Four times a day (QID) | ORAL | Status: DC | PRN
  Administered 2015-04-01: 0.5 mg via ORAL
  Filled 2015-04-01: qty 1

## 2015-04-01 MED ORDER — WARFARIN SODIUM 5 MG PO TABS: 2.5000 mg | ORAL_TABLET | ORAL | Status: DC

## 2015-04-01 MED ORDER — WARFARIN SODIUM 5 MG PO TABS: 5.0000 mg | ORAL_TABLET | ORAL | Status: DC

## 2015-04-01 MED ORDER — DIPHENHYDRAMINE HCL 50 MG/ML IJ SOLN
25.0000 mg | Freq: Once | INTRAMUSCULAR | Status: AC
Start: 2015-04-01 — End: 2015-04-01
  Administered 2015-04-01: 25 mg via INTRAVENOUS

## 2015-04-01 MED ORDER — APIXABAN 5 MG PO TABS: 5.0000 mg | ORAL_TABLET | Freq: Two times a day (BID) | ORAL | Status: DC

## 2015-04-01 MED ORDER — FAMOTIDINE IN NACL 20-0.9 MG/50ML-% IV SOLN
20.0000 mg | Freq: Once | INTRAVENOUS | Status: AC
  Administered 2015-04-01: 20 mg via INTRAVENOUS
  Filled 2015-04-01: qty 50

## 2015-04-01 MED ORDER — DIPHENHYDRAMINE HCL 50 MG/ML IJ SOLN
25.0000 mg | Freq: Once | INTRAMUSCULAR | Status: AC
  Administered 2015-04-01: 25 mg via INTRAVENOUS
  Filled 2015-04-01: qty 1

## 2015-04-01 MED ORDER — DIPHENHYDRAMINE HCL 50 MG/ML IJ SOLN
INTRAMUSCULAR | Status: AC
  Administered 2015-04-01: 25 mg
  Filled 2015-04-01: qty 1

## 2015-04-01 MED ORDER — WHITE PETROLATUM GEL
Status: AC
  Filled 2015-04-01: qty 1

## 2015-04-01 NOTE — Progress Notes (Signed)
Pt had hives on bilateral arms, no c/o difficulty swallowing. No SOB. IV retsrted and benadryl given per DO. Pt seems to be less "itchy" rash is less now.

## 2015-04-01 NOTE — Consult Note (Signed)
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  Imaging did not demonstrate acute infarct.  His symptoms resolved within 1.5 hours and he was felt to have had a TIA.  INR on admission 1.9. He feels that he is improving and his symptoms have resolved. EP has been asked to evaluate for treatment options.  He was recently started on Tikosyn after having failed Multaq for AF management.  Post initiation, he had recurrent AF.  At that time, amiodarone vs ablation was offered, but the patient did not wish to make changes at that time.  He states that he had 8 hours of AF on Sunday.   Today, he denies symptoms of palpitations, chest pain, shortness of breath, orthopnea, PND, lower extremity edema, dizziness, presyncope, syncope, or neurologic sequela. The patient is tolerating medications without difficulties and is otherwise without complaint today.   Past Medical History  Diagnosis Date  . CAD (coronary artery disease)   . Sinus bradycardia   . Anxiety   . Asthma   . Atrial fibrillation ([place])   . GERD (gastroesophageal reflux disease) 12/30/2000  . Hyperlipidemia   . Hypertension   . Allergic rhinitis   . Diverticulosis 03/16/1995  . Hiatal hernia 12/30/2000  . History of duodenal ulcer 10/15/1986  . Heart attack ([place]) 1984  . History of blood transfusion 1988    "related to GI bleeding OR"  . DJD (degenerative joint disease)     "most joints" (03/11/2015)  . Depression    Past Surgical History  Procedure Laterality Date  . Nasal septum surgery  1972  . Hospital ccu  1984    heart attack, hypoplastic right coronary  . Total knee arthroplasty Bilateral 1998-2004      "right-left"  . Coronary angioplasty with stent placement  2010  . Vagotomy and pyloroplasty  1988    "bleeding duodenal ulcer"  . Tonsillectomy    . Cholecystectomy open  1989  . Cardiac catheterization    . Inguinal hernia repair Right ~ 1990  . Cataract extraction w/ intraocular lens  implant, bilateral Bilateral ?2013    . apixaban  5 mg Oral BID  . atenolol  25 mg Oral BID  . atorvastatin  20 mg Oral q1800  . buPROPion  150 mg Oral Daily  . cholecalciferol  1,000 Units Oral Daily  . diphenhydrAMINE  25 mg Intravenous Once  . dofetilide  250 mcg Oral BID  . famotidine (PEPCID) IV  20 mg Intravenous Once  . fluticasone  2 spray Each Nare Daily  . [START ON 04/02/2015] furosemide  20 mg Oral QODAY  . loratadine  10 mg Oral Daily  . montelukast  10 mg Oral QHS  . omega-3 acid ethyl esters  1 g Oral Daily  . pantoprazole  40 mg Oral Daily  . tamsulosin  0.4 mg Oral QPC supper      No Known Allergies  Social History   Social History  . Marital Status: Married    Spouse Name: N/A  . Number of Children: 2  . Years of Education: N/A   Occupational History  . DOCTOR    Social History Main Topics  . Smoking status: Never Smoker   . Smokeless  tobacco: Never Used  . Alcohol Use: No  . Drug Use: No  . Sexual Activity: Not Currently   Other Topics Concern  . Not on file   Social History Narrative   Married. Regular exercise - yes.     Family History  Problem Relation Age of Onset  . Coronary artery disease Mother     deceased  . Deep vein thrombosis Sister   . Coronary artery disease Brother   . Asthma Brother   . Colon cancer Neg Hx     ROS- All systems are reviewed and negative except as per the HPI above  Physical Exam: Telemetry: Filed Vitals:   04/01/15 0500 04/01/15 0655 04/01/15 0800 04/01/15 1400  BP: 119/54 117/54 121/60 112/61  Pulse: 60 60 62 62  Temp: 98 F (36.7 C) 97.8 F (36.6 C) 97.6 F (36.4 C) 98 F (36.7 C)  TempSrc: Oral Oral  Axillary Oral  Resp: 16 16 16 16   Height:      Weight:      SpO2: 96% 95% 96% 96%    GEN- The patient is well appearing, alert and oriented x 3 today.   Head- normocephalic, atraumatic Eyes-  Sclera clear, conjunctiva pink Ears- hearing intact Oropharynx- clear Neck- supple,  Lungs- Clear to ausculation bilaterally, normal work of breathing Heart- Regular rate and rhythm  GI- soft, NT, ND, + BS Extremities- no clubbing, cyanosis, or edema MS- no significant deformity or atrophy Skin- no rash or lesion Psych- euthymic mood, full affect Neuro- strength and sensation are intact  EKG- sinus rhythm, QTc 439  Labs:   Lab Results  Component Value Date   WBC 9.3 03/31/2015   HGB 15.6 03/31/2015   HCT 46.0 03/31/2015   MCV 92.8 03/31/2015   PLT 228 03/31/2015    Recent Labs Lab 03/31/15 1930 03/31/15 1940  NA 134* 137  K 4.5 4.6  CL 102 102  CO2 23  --   BUN 31* 37*  CREATININE 1.53* 1.40*  CALCIUM 8.9  --   PROT 6.5  --   BILITOT 0.4  --   ALKPHOS 82  --   ALT 16*  --   AST 20  --   GLUCOSE 174* 175*    Radiology: no acute findings  Echo: 08/2014 demonstrated EF 123456, grade 2 diastolic dysfunction, mild to moderate MR, PA pressure 64, moderate TR, LA 51  ASSESSMENT AND PLAN:  1.  Persistent atrial fibrillation The patient has persistent atrial fibrillation and has previously failed medical therapy with Multaq. He was started on Tikosyn recently and has had some AF post loading, but feels that his burden is improved. Continue Tikosyn for now.  QTc stable Keep K >3.9, Mg >1.8 He had a TIA with an INR of 1.9 Plan per neurology to change Warfarin to Eliquis.  Will arrange follow up with AF clinic next week  2.  TIA Management per primary team/neurology  3.  HTN Stable No change required today  4.  CAD No recent ischemic symptoms  Electrophysiology team to see as needed while here. Please call with questions.   [name], MD 04/01/2015  2:51  PM

## 2015-04-01 NOTE — Progress Notes (Addendum)
ANTICOAGULATION CONSULT NOTE - Follow Up Consult  Pharmacy Consult for Warfarin Indication: atrial fibrillation  No Known Allergies  Patient Measurements: Height: 5\' 10"  (177.8 cm) Weight: 159 lb 6.4 oz (72.303 kg) IBW/kg (Calculated) : 73  Vital Signs: Temp: 97.6 F (36.4 C) (02/21 0800) Temp Source: Axillary (02/21 0800) BP: 121/60 mmHg (02/21 0800) Pulse Rate: 62 (02/21 0800)  Labs:  Recent Labs  03/31/15 1930 03/31/15 1940 04/01/15 0545  HGB 14.0 15.6  --   HCT 43.9 46.0  --   PLT 228  --   --   APTT 31  --   --   LABPROT 21.8*  --  22.6*  INR 1.91*  --  2.00*  CREATININE 1.53* 1.40*  --     Estimated Creatinine Clearance: 40.9 mL/min (by C-G formula based on Cr of 1.4).   Medical History: Past Medical History  Diagnosis Date  . CAD (coronary artery disease)   . Sinus bradycardia   . Anxiety   . Asthma   . Atrial fibrillation ([place])   . GERD (gastroesophageal reflux disease) 12/30/2000  . Hyperlipidemia   . Hypertension   . Allergic rhinitis   . Diverticulosis 03/16/1995  . Hiatal hernia 12/30/2000  . History of duodenal ulcer 10/15/1986  . Heart attack ([place]) 1984  . History of blood transfusion 1988    "related to GI bleeding OR"  . DJD (degenerative joint disease)     "most joints" (03/11/2015)  . Depression     Assessment: 72 YOM who presented to the [facility] on 2/20 as a CODE STROKE. The patient was noted to be on warfarin PTA for history of Afib. Given the patient's admit INR of 1.9 and improving symptoms - no tPA was given. Pharmacy was consulted to resume warfarin dosing.  PTA the patient was taking 2.5 mg daily EXCEPT for 5 mg on Tues/Thurs/Sun. The patient's last dose was earlier today and was 2.5 mg. The patient's INR was SUBtherapeutic on admit (INR 1.91, goal of 2-3). CBC was wnl - no bleeding noted. Will give an additional dose tonight to attempt to get INR back in goal range.   2/21 INR back up 2.0 - will continue home dose Patient ECG on  admit shows SR on Tikosyn K at goal > 4, QTc ok 430 will check Mag with AML  Goal of Therapy:  INR 2-3   Plan:  1.  Warfarin 2.5mg  MWFSu/5mg  TTSa 2. Daily PT/INR 3. Will continue to monitor for any signs/symptoms of bleeding   Bonnita Nasuti Pharm.D. CPP, BCPS Clinical Pharmacist [phone] 04/01/2015 12:53 PM   PM Addendum Per MD Stop warfarin start Apixaban 5mg  BID Age > 80, Wt > 60kg, Cr < 1.5  Bonnita Nasuti Pharm.D. CPP, BCPS Clinical Pharmacist [phone] 04/01/2015 1:35 PM

## 2015-04-01 NOTE — Evaluation (Signed)
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 MRI revealed R parietal hemorrhage.  Clinical Impression  Pt functioning at baseline. Pt at minimal falls risk as noted by score of 23/24 on DGI. Pt functioning at mod I with no further acute PT needs at this time. Pt with good home set up and support. PT signing off. Please re-consult if needed in future.    Follow Up Recommendations No PT follow up    Equipment Recommendations  None recommended by PT    Recommendations for Other Services       Precautions / Restrictions Precautions Precautions: None Restrictions Weight Bearing Restrictions: No      Mobility  Bed Mobility               General bed mobility comments: pt received sitting up in chair  Transfers Overall transfer level: Modified independent Equipment used: None Transfers: Sit to/from Stand Sit to Stand: Modified independent (Device/Increase time)         General transfer comment: pushed up from arm rests of chair  Ambulation/Gait Ambulation/Gait assistance: Modified independent (Device/Increase time) Ambulation Distance (Feet): 250 Feet Assistive device: None Gait Pattern/deviations: WFL(Within Functional Limits) Gait velocity: wfl Gait velocity interpretation: at or above normal speed for age/gender General Gait Details: no episodes of LOB  Stairs Stairs: Yes Stairs assistance: Modified independent (Device/Increase time) Stair Management: One rail Right Number of Stairs: 2    Wheelchair Mobility    Modified Rankin (Stroke Patients  Only) Modified Rankin (Stroke Patients Only) Pre-Morbid Rankin Score: No symptoms Modified Rankin: No significant disability     Balance Overall balance assessment:  (modA to tandem amb)                               Standardized Balance Assessment Standardized Balance Assessment : Dynamic Gait Index   Dynamic Gait Index Level Surface: Normal Change in Gait Speed: Normal Gait with Horizontal Head Turns: Normal Gait with Vertical Head Turns: Normal Gait and Pivot Turn: Normal Step Over Obstacle: Normal Step Around Obstacles: Normal Steps: Mild Impairment Total Score: 23       Pertinent Vitals/Pain Pain Assessment: No/denies pain    Home Living Family/patient expects to be discharged to:: Private residence Living Arrangements: Spouse/significant other Available Help at Discharge: Family;Available 24 hours/day Type of Home: House Home Access: Stairs to enter Entrance Stairs-Rails: Can reach both Entrance Stairs-Number of Steps: 2 Home Layout: Multi-level;Able to live on main level with bedroom/bathroom Home Equipment: Grab bars - tub/shower      Prior Function Level of Independence: Independent         Comments: golfs     Hand Dominance   Dominant Hand: Right    Extremity/Trunk Assessment   Upper Extremity Assessment: Overall WFL for tasks assessed (mild weakness in L hand grip, 4/5)           Lower Extremity Assessment: Overall WFL for tasks assessed      Cervical / Trunk Assessment: Normal  Communication   Communication: HOH  Cognition Arousal/Alertness: Awake/alert  Behavior During Therapy: WFL for tasks assessed/performed Overall Cognitive Status: Within Functional Limits for tasks assessed                      General Comments      Exercises        Assessment/Plan    PT Assessment Patent does not need any further PT services  PT Diagnosis Generalized weakness   PT Problem List    PT Treatment Interventions      PT Goals (Current goals can be found in the Care Plan section) Acute Rehab PT Goals Patient Stated Goal: home asap PT Goal Formulation: All assessment and education complete, DC therapy    Frequency     Barriers to discharge        Co-evaluation               End of Session Equipment Utilized During Treatment: Gait belt Activity Tolerance: Patient tolerated treatment well Patient left: in chair;with call bell/phone within reach;with family/visitor present Nurse Communication: Mobility status    Functional Assessment Tool Used: clinical judgement Functional Limitation: Mobility: Walking and moving around Mobility: Walking and Moving Around Current Status [phone]): 0 percent impaired, limited or restricted Mobility: Walking and Moving Around Goal Status [phone]): 0 percent impaired, limited or restricted Mobility: Walking and Moving Around Discharge Status [phone]): 0 percent impaired, limited or restricted    Time: [id number] PT Time Calculation (min) (ACUTE ONLY): 19 min   Charges:   PT Evaluation $PT Eval Low Complexity: 1 Procedure     PT G Codes:   PT G-Codes **NOT FOR INPATIENT CLASS** Functional Assessment Tool Used: clinical judgement Functional Limitation: Mobility: Walking and moving around Mobility: Walking and Moving Around Current Status [id number]): 0 percent impaired, limited or restricted Mobility: Walking and Moving Around Goal Status [id number]): 0 percent impaired, limited or restricted Mobility: Walking and Moving Around Discharge Status [phone]): 0 percent impaired, limited or restricted    Kingsley Callander 04/01/2015, 11:02 AM   Kittie Plater, PT, DPT Pager #: [phone] Office #: [phone]

## 2015-04-01 NOTE — Progress Notes (Signed)
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 He has no previous history of stroke nor TIA. INR was 1.9. CT scan of his head showed no acute intracranial abnormality. CT angiogram of head and neck showed a right M2 branch flow gap of 3 mm with preserved flow distally. Nondominant vertebral artery showed moderate to severe stenosis at the origin and in the distal left V4 segment. Patient's deficits cleared fairly rapidly. NIH stroke score was 4 at the time of his initial evaluation. Patient subsequently became clear all deficits with no intervention. He was LSN  6:15 PM on 03/31/2015. Patient was not administered TPA secondary to Rapid resolution of deficits; INR of 1.9. He was admitted for further evaluation and treatment.   SUBJECTIVE (INTERVAL HISTORY) His wife is at the bedside.  Overall he feels his condition is completely resolved.  He was compliant with warfarin with INR 1.9 on admission for his afib   OBJECTIVE Temp:  [96.2 F (35.7 C)-98 F (36.7 C)] 97.8 F (36.6 C) (02/21 0655) Pulse Rate:  [57-74] 60 (02/21 0655) Cardiac Rhythm:  [-] Normal sinus rhythm (02/20 2325) Resp:  [13-24] 16 (02/21 0655) BP: (109-171)/(54-85) 117/54 mmHg (02/21 0655) SpO2:  [95 %-100 %] 95 % (02/21 0655) Weight:  [72.303 kg (159 lb 6.4 oz)-76.7 kg (169 lb 1.5 oz)] 72.303 kg (159 lb 6.4 oz) (02/20 2308)  CBC:  Recent Labs Lab 03/31/15 1930 03/31/15 1940  WBC 9.3  --   NEUTROABS 6.4  --   HGB 14.0 15.6  HCT 43.9 46.0  MCV 92.8  --   PLT 228  --     Basic Metabolic Panel:  Recent Labs Lab 03/31/15 1930 03/31/15 1940   NA 134* 137  K 4.5 4.6  CL 102 102  CO2 23  --   GLUCOSE 174* 175*  BUN 31* 37*  CREATININE 1.53* 1.40*  CALCIUM 8.9  --     Lipid Panel:    Component Value Date/Time   CHOL 78 04/01/2015 0545   TRIG 91 04/01/2015 0545   HDL 28* 04/01/2015 0545   CHOLHDL 2.8 04/01/2015 0545   VLDL 18 04/01/2015 0545   LDLCALC 32 04/01/2015 0545   HgbA1c:  Lab Results  Component Value Date   HGBA1C 6.2* 06/20/2007   Urine Drug Screen:    Component Value Date/Time   LABOPIA NONE DETECTED 03/31/2015 2107   COCAINSCRNUR NONE DETECTED 03/31/2015 2107   LABBENZ NONE DETECTED 03/31/2015 2107   AMPHETMU NONE DETECTED 03/31/2015 2107   THCU NONE DETECTED 03/31/2015 2107   LABBARB NONE DETECTED 03/31/2015 2107      IMAGING  Ct Angio Head W/cm &/or Wo Cm  03/31/2015  CLINICAL DATA:  82 year old male code stroke presentation with slurred speech and left side weakness. Symptoms partially resolved. Initial encounter. EXAM: CT ANGIOGRAPHY HEAD AND NECK TECHNIQUE: Multidetector CT imaging of the head and neck was performed using the standard protocol during bolus administration of intravenous contrast. Multiplanar CT image reconstructions and MIPs were obtained to evaluate the vascular anatomy. Carotid stenosis measurements (when applicable) are obtained utilizing NASCET criteria, using the distal internal carotid diameter as the denominator. CONTRAST:  26mL OMNIPAQUE IOHEXOL 350 MG/ML SOLN COMPARISON:  Head CT without contrast 1920 hours today. FINDINGS: CTA NECK Skeleton: Subtotal paranasal sinus opacification including fluid levels in bubbly opacity in the frontal sinuses, posterior ethmoids, and sphenoid sinuses. Superimposed paranasal sinus periostitis. Tympanic cavities and mastoids are clear. Mild scoliosis and age appropriate degenerative changes in the spine. No acute osseous abnormality identified. Other neck: Negative lung apices. No superior mediastinal lymphadenopathy. Negative thyroid, larynx,  pharynx, parapharyngeal spaces, retropharyngeal space, sublingual space, submandibular glands, and parotid glands. No cervical lymphadenopathy. Aortic arch: 3 vessel arch configuration. Very little calcified aortic plaque at the level of the great vessels. Moderate mostly calcified plaque in the distal arch distal to the left subclavian origin. No great vessel origin stenosis. Right carotid system: Minimal right CCA calcified plaque. Mild to moderate soft and calcified plaque at the right carotid bifurcation but less than 50 % stenosis with respect to the distal vessel at the right ICA origin and bulb. Negative cervical right ICA otherwise. Left carotid system: Mild left CCA calcified plaque without stenosis. Minimal calcified plaque at the left carotid bifurcation and bulb with no stenosis. Otherwise negative cervical left ICA. Vertebral arteries: No proximal subclavian artery stenosis. Calcified plaque at the right vertebral artery origin with mild origin stenosis (series 403, image 111). Soft and calcified plaque at the left vertebral artery origin with moderate to severe stenosis (series 403, image 127 series 401, images 48 and 49). The right vertebral artery is dominant. Both vertebral arteries remain patent to the skullbase with no additional stenosis. CTA HEAD Posterior circulation: Dominant distal right vertebral artery. Both PICA origins are normal and arise early. Bilateral calcified plaque in the vertebral artery V4 segments. Mild stenosis on the right and moderate to severe stenosis of the non dominant distal left vertebral artery. The left vertebral artery then is highly attenuated leading up to the vertebrobasilar junction. Normal patency of the right vertebrobasilar junction. No basilar artery stenosis. Normal SCA and PCA origins. Mild venous contamination about the left PCA. Bilateral PCA branches are within normal limits. Anterior circulation: Right ICA siphon is patent with mild to moderate  calcified plaque. Up to mild supra clinoid right ICA stenosis. Normal right ophthalmic artery origin. Patent right ICA terminus. Normal right MCA and ACA origins. Right MCA M1 segment and bifurcation remain patent. There is a high-grade stenosis in a right M2 middle still the middle sylvian division best seen on series 406, image 8. The distal vessel remains patent (same series image 7). Other right MCA branches are within normal limits. Left ICA siphon is patent with moderate calcified plaque most pronounced in the supraclinoid segment. Mild to moderate left supraclinoid stenosis. Left ICA terminus is patent. Left MCA and ACA origins are normal. Anterior communicating artery is diminutive or absent. Mild bilateral ACA branch irregularity, no ACA branch occlusion identified. Left MCA M1 segment, bifurcation, and left MCA branches are within normal limits. Venous sinuses: Patent. Anatomic variants: Dominant right vertebral artery. IMPRESSION: 1. Negative for emergent large vessel occlusion, but positive for a right MCA M2 branch lesion best seen on series 406 image 7. There is a ~3 mm flow gap, but preserved distal flow. This was discussed by telephone with Dr. Wallie Char on 03/31/2015 at 0829 hours. 2. Mild for age anterior circulation atherosclerosis in the neck. Up to moderate ICA siphon calcified plaque with mild to moderate supraclinoid stenoses, greater on the left. 3. Nondominant left vertebral artery with moderate to severe stenosis at its origin and  in the distal left V4 segment. 4. Dominant right vertebral artery with calcified plaque causing mild stenosis at its origin and right V4 segment. 5. Otherwise negative posterior circulation. 6. Acute on chronic pansinusitis. Electronically Signed   By: Genevie Ann M.D.   On: 03/31/2015 20:35   Ct Head Wo Contrast  03/31/2015  CLINICAL DATA:  82 year old with acute onset of slurred speech approximately 6:30 p.m. this evening. Current history of atrial  fibrillation. EXAM: CT HEAD WITHOUT CONTRAST TECHNIQUE: Contiguous axial images were obtained from the base of the skull through the vertex without intravenous contrast. COMPARISON:  None. FINDINGS: Ventricular system normal in size and appearance for age. Mild age related cortical atrophy. Old lacunar stroke in the inferior left basal ganglia. No mass lesion. No midline shift. No acute hemorrhage or hematoma. No extra-axial fluid collections. No evidence of acute infarction. No skull fracture or other focal osseous abnormality involving the skull. Near complete opacification of all of the visualized paranasal sinuses. Bilateral mastoid air cells and bilateral middle ear cavities well-aerated. Mild bilateral carotid siphon and vertebral artery atherosclerosis. IMPRESSION: 1. No acute intracranial abnormality. 2. Old lacunar stroke involving the inferior left basal ganglia. 3. Severe pansinusitis, likely acute on chronic. Critical Value/emergent results were called by telephone at the time of interpretation on 03/31/2015 at 7:35 pm to Dr. Nicole Kindred, who verbally acknowledged these results. Electronically Signed   By: Evangeline Dakin M.D.   On: 03/31/2015 19:38   Ct Angio Neck W/cm &/or Wo/cm  03/31/2015  CLINICAL DATA:  82 year old male code stroke presentation with slurred speech and left side weakness. Symptoms partially resolved. Initial encounter. EXAM: CT ANGIOGRAPHY HEAD AND NECK TECHNIQUE: Multidetector CT imaging of the head and neck was performed using the standard protocol during bolus administration of intravenous contrast. Multiplanar CT image reconstructions and MIPs were obtained to evaluate the vascular anatomy. Carotid stenosis measurements (when applicable) are obtained utilizing NASCET criteria, using the distal internal carotid diameter as the denominator. CONTRAST:  84mL OMNIPAQUE IOHEXOL 350 MG/ML SOLN COMPARISON:  Head CT without contrast 1920 hours today. FINDINGS: CTA NECK Skeleton: Subtotal  paranasal sinus opacification including fluid levels in bubbly opacity in the frontal sinuses, posterior ethmoids, and sphenoid sinuses. Superimposed paranasal sinus periostitis. Tympanic cavities and mastoids are clear. Mild scoliosis and age appropriate degenerative changes in the spine. No acute osseous abnormality identified. Other neck: Negative lung apices. No superior mediastinal lymphadenopathy. Negative thyroid, larynx, pharynx, parapharyngeal spaces, retropharyngeal space, sublingual space, submandibular glands, and parotid glands. No cervical lymphadenopathy. Aortic arch: 3 vessel arch configuration. Very little calcified aortic plaque at the level of the great vessels. Moderate mostly calcified plaque in the distal arch distal to the left subclavian origin. No great vessel origin stenosis. Right carotid system: Minimal right CCA calcified plaque. Mild to moderate soft and calcified plaque at the right carotid bifurcation but less than 50 % stenosis with respect to the distal vessel at the right ICA origin and bulb. Negative cervical right ICA otherwise. Left carotid system: Mild left CCA calcified plaque without stenosis. Minimal calcified plaque at the left carotid bifurcation and bulb with no stenosis. Otherwise negative cervical left ICA. Vertebral arteries: No proximal subclavian artery stenosis. Calcified plaque at the right vertebral artery origin with mild origin stenosis (series 403, image 111). Soft and calcified plaque at the left vertebral artery origin with moderate to severe stenosis (series 403, image 127 series 401, images 48 and 49). The right vertebral artery is dominant. Both vertebral arteries remain patent  to the skullbase with no additional stenosis. CTA HEAD Posterior circulation: Dominant distal right vertebral artery. Both PICA origins are normal and arise early. Bilateral calcified plaque in the vertebral artery V4 segments. Mild stenosis on the right and moderate to severe  stenosis of the non dominant distal left vertebral artery. The left vertebral artery then is highly attenuated leading up to the vertebrobasilar junction. Normal patency of the right vertebrobasilar junction. No basilar artery stenosis. Normal SCA and PCA origins. Mild venous contamination about the left PCA. Bilateral PCA branches are within normal limits. Anterior circulation: Right ICA siphon is patent with mild to moderate calcified plaque. Up to mild supra clinoid right ICA stenosis. Normal right ophthalmic artery origin. Patent right ICA terminus. Normal right MCA and ACA origins. Right MCA M1 segment and bifurcation remain patent. There is a high-grade stenosis in a right M2 middle still the middle sylvian division best seen on series 406, image 8. The distal vessel remains patent (same series image 7). Other right MCA branches are within normal limits. Left ICA siphon is patent with moderate calcified plaque most pronounced in the supraclinoid segment. Mild to moderate left supraclinoid stenosis. Left ICA terminus is patent. Left MCA and ACA origins are normal. Anterior communicating artery is diminutive or absent. Mild bilateral ACA branch irregularity, no ACA branch occlusion identified. Left MCA M1 segment, bifurcation, and left MCA branches are within normal limits. Venous sinuses: Patent. Anatomic variants: Dominant right vertebral artery. IMPRESSION: 1. Negative for emergent large vessel occlusion, but positive for a right MCA M2 branch lesion best seen on series 406 image 7. There is a ~3 mm flow gap, but preserved distal flow. This was discussed by telephone with Dr. Wallie Char on 03/31/2015 at 0829 hours. 2. Mild for age anterior circulation atherosclerosis in the neck. Up to moderate ICA siphon calcified plaque with mild to moderate supraclinoid stenoses, greater on the left. 3. Nondominant left vertebral artery with moderate to severe stenosis at its origin and in the distal left V4 segment.  4. Dominant right vertebral artery with calcified plaque causing mild stenosis at its origin and right V4 segment. 5. Otherwise negative posterior circulation. 6. Acute on chronic pansinusitis. Electronically Signed   By: Genevie Ann M.D.   On: 03/31/2015 20:35       PHYSICAL EXAM Pleasant elderly Caucasian male not in distress. . Afebrile. Head is nontraumatic. Neck is supple without bruit.    Cardiac exam no murmur or gallop. Lungs are clear to auscultation. Distal pulses are well felt. Neurological Exam ;  Awake  Alert oriented x 3. Normal speech and language.eye movements full without nystagmus.fundi were not visualized. Vision acuity and fields appear normal. Hearing is normal. Palatal movements are normal. Face symmetric. Tongue midline. Normal strength, tone, reflexes and coordination. Normal sensation. Gait deferred. 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 He did not receive IV t-PA due to elevated INR, resolving sx.   Hemorrhagic right pareital infarct secondary to known atrial fibrillation   Resultant  No deficits  MRI  Right parietal cortical hemorrhage which appears recent.  CTA head and neck no lg vessel occlusion, R M2 lesion, L V4 severe stenosis, dom RVA. Acute on chronic acute pansinusitis  2D Echo    (not ordered)  LDL 32  HgbA1c pending   Warfarin for VTE prophylaxis  Diet Heart Room  service appropriate?: Yes; Fluid consistency:: Thin  warfarin daily prior to admission, now on aspirin 325 mg daily and warfarin daily    Patient counseled to be compliant with his antithrombotic medications  Ongoing aggressive stroke risk factor management  Therapy recommendations: pending  Disposition:  home  Atrial Fibrillation  Home anticoagulation:  warfarin daily continued in the hospital  INR 1.91 on admission, 2.00  today  Change to eliquis at discharge    Hypertension   Stable Permissive hypertension (OK if < 220/120) but gradually normalize in 5-7 days  Hyperlipidemia  Home meds:  lipitor 20, resumed in hospital  LDL 32, goal < 70  Continue statin at discharge  Other Stroke Risk Factors  Advanced age  Coronary artery disease - angioplasty and stent 2010   Other Active Problems  Asthma  Diastolic dysfunction    Hospital day #   I have personally examined this patient, reviewed notes, independently viewed imaging studies, participated in medical decision making and plan of care. I have made any additions or clarifications directly to the above note. He remains at risk for recurrent stroke, TIAs, hematoma expansion and neurological worsening. Patient has had a hemorrhagic right parietal small infarct visit essentially asymptomatic. He however remains at significant risk for recurrent strokes from his atrial fibrillation which occurred despite being on optimal dose of warfarin. I had a long discussion with the patient and wife regarding available newer anticoagulants which appear to be slightly better and safer with lesser risk of intracerebral hemorrhage than warfarin. I recommend repeat CT scan of the head tomorrow morning and if blood is isodense discharge on eliquis. Discontinue warfarin today. The patient and wife are in agreement.  Antony Contras, MD Medical Director [facility] Pager: [phone] 04/01/2015 6:30 PM     To contact Stroke Continuity provider, please refer to [email or web address] After hours, contact General Neurology

## 2015-04-01 NOTE — Care Management Note (Signed)
Case Management Note  Patient Details  Name: ACHILLEUS VANARSDALL, MD MRN: [id number] Date of Birth: 04-26-1929  Subjective/Objective:                    Action/Plan: Patient was admitted with Left Arm Weakness and Slurred Speech.  Lives at home with spouse. Will follow for discharge needs pending PT/OT evals and physician orders.  Expected Discharge Date:                  Expected Discharge Plan:     In-House Referral:     Discharge planning Services     Post Acute Care Choice:    Choice offered to:     DME Arranged:    DME Agency:     HH Arranged:    HH Agency:     Status of Service:  In process, will continue to follow  Medicare Important Message Given:    Date Medicare IM Given:    Medicare IM give by:    Date Additional Medicare IM Given:    Additional Medicare Important Message give by:     If discussed at [place] of Stay Meetings, dates discussed:    Additional Comments:  Rolm Baptise, RN 04/01/2015, 10:50 AM [phone]

## 2015-04-01 NOTE — Progress Notes (Signed)
PROGRESS NOTE  [name], MD [id number] DOB: 09/29/1929 DOA: 03/31/2015 PCP: [name], MD Brief History 82 y.o. male with a h/o persistent atrial fibrillation on coumadin, CAD, hypertension, hyperlipidemia and asthma who presented on the day of admission with weakness and syncope.  Patient went to bathroom and wife heard a thump shortly after. Pt found on the floor unresponsive. Pt quickly became arousable Wife noted slurred speech and left facial droop left arm weakness. EMS was activated, and code stroke was activated. Initial CT of the brain was negative for acute findings. However, MRI of the brain showed right parietal cortical hemorrhage, acute versus subacute. Neurology was consulted and full stroke workup was undertaken.  Assessment/Plan: Hemorrhagic right pareital infarct secondary to known atrial fibrillation  -appreciate neurology following -CTA head and neck no lg vessel occlusion, R M2 lesion, L V4 severe stenosis, dom RVA. -LDL 32 -Hemoglobin A1C pending -Hold warfarin for today -PT/OT -neurology plans repeat CT brain am 2/22--if hemorrhage area no worse, start apixaban 2/22 -INR 1.91 on day of admission Syncope -consulted cardiology in setting of Afib -no further workup for now Persistent Afib -continue Tikosyn per cardiology -rate controlled -CHADSVASc--5 -appreciate cardiology consult -hold wafarin for now Hypersensitivity reaction -pt developed hives on afternoon 04/01/15 -??delayed reaction to ASA or IV dye -improved with benadryl 25mg  IV x 2 and pepcid 20 mg x1 HTN -controlled -continue atenolol Hyperlipidemia -LDL 32 -continue atorvastatin CAD -no angina -angioplasty and stent 2010  Family Communication:   Wife updated at beside--total time 35 min Disposition Plan:   Home 1-2 days       Procedures/Studies: Ct Angio Head W/cm &/or Wo Cm  03/31/2015  CLINICAL DATA:  82 year old male code stroke presentation with slurred  speech and left side weakness. Symptoms partially resolved. Initial encounter. EXAM: CT ANGIOGRAPHY HEAD AND NECK TECHNIQUE: Multidetector CT imaging of the head and neck was performed using the standard protocol during bolus administration of intravenous contrast. Multiplanar CT image reconstructions and MIPs were obtained to evaluate the vascular anatomy. Carotid stenosis measurements (when applicable) are obtained utilizing NASCET criteria, using the distal internal carotid diameter as the denominator. CONTRAST:  4mL OMNIPAQUE IOHEXOL 350 MG/ML SOLN COMPARISON:  Head CT without contrast 1920 hours today. FINDINGS: CTA NECK Skeleton: Subtotal paranasal sinus opacification including fluid levels in bubbly opacity in the frontal sinuses, posterior ethmoids, and sphenoid sinuses. Superimposed paranasal sinus periostitis. Tympanic cavities and mastoids are clear. Mild scoliosis and age appropriate degenerative changes in the spine. No acute osseous abnormality identified. Other neck: Negative lung apices. No superior mediastinal lymphadenopathy. Negative thyroid, larynx, pharynx, parapharyngeal spaces, retropharyngeal space, sublingual space, submandibular glands, and parotid glands. No cervical lymphadenopathy. Aortic arch: 3 vessel arch configuration. Very little calcified aortic plaque at the level of the great vessels. Moderate mostly calcified plaque in the distal arch distal to the left subclavian origin. No great vessel origin stenosis. Right carotid system: Minimal right CCA calcified plaque. Mild to moderate soft and calcified plaque at the right carotid bifurcation but less than 50 % stenosis with respect to the distal vessel at the right ICA origin and bulb. Negative cervical right ICA otherwise. Left carotid system: Mild left CCA calcified plaque without stenosis. Minimal calcified plaque at the left carotid bifurcation and bulb with no stenosis. Otherwise negative cervical left ICA. Vertebral arteries:  No proximal subclavian artery stenosis. Calcified plaque at the right vertebral artery origin with mild origin stenosis (series 403, image  111). Soft and calcified plaque at the left vertebral artery origin with moderate to severe stenosis (series 403, image 127 series 401, images 48 and 49). The right vertebral artery is dominant. Both vertebral arteries remain patent to the skullbase with no additional stenosis. CTA HEAD Posterior circulation: Dominant distal right vertebral artery. Both PICA origins are normal and arise early. Bilateral calcified plaque in the vertebral artery V4 segments. Mild stenosis on the right and moderate to severe stenosis of the non dominant distal left vertebral artery. The left vertebral artery then is highly attenuated leading up to the vertebrobasilar junction. Normal patency of the right vertebrobasilar junction. No basilar artery stenosis. Normal SCA and PCA origins. Mild venous contamination about the left PCA. Bilateral PCA branches are within normal limits. Anterior circulation: Right ICA siphon is patent with mild to moderate calcified plaque. Up to mild supra clinoid right ICA stenosis. Normal right ophthalmic artery origin. Patent right ICA terminus. Normal right MCA and ACA origins. Right MCA M1 segment and bifurcation remain patent. There is a high-grade stenosis in a right M2 middle still the middle sylvian division best seen on series 406, image 8. The distal vessel remains patent (same series image 7). Other right MCA branches are within normal limits. Left ICA siphon is patent with moderate calcified plaque most pronounced in the supraclinoid segment. Mild to moderate left supraclinoid stenosis. Left ICA terminus is patent. Left MCA and ACA origins are normal. Anterior communicating artery is diminutive or absent. Mild bilateral ACA branch irregularity, no ACA branch occlusion identified. Left MCA M1 segment, bifurcation, and left MCA branches are within normal limits.  Venous sinuses: Patent. Anatomic variants: Dominant right vertebral artery. IMPRESSION: 1. Negative for emergent large vessel occlusion, but positive for a right MCA M2 branch lesion best seen on series 406 image 7. There is a ~3 mm flow gap, but preserved distal flow. This was discussed by telephone with Dr. Wallie Char on 03/31/2015 at 0829 hours. 2. Mild for age anterior circulation atherosclerosis in the neck. Up to moderate ICA siphon calcified plaque with mild to moderate supraclinoid stenoses, greater on the left. 3. Nondominant left vertebral artery with moderate to severe stenosis at its origin and in the distal left V4 segment. 4. Dominant right vertebral artery with calcified plaque causing mild stenosis at its origin and right V4 segment. 5. Otherwise negative posterior circulation. 6. Acute on chronic pansinusitis. Electronically Signed   By: Genevie Ann M.D.   On: 03/31/2015 20:35   Ct Head Wo Contrast  03/31/2015  CLINICAL DATA:  82 year old with acute onset of slurred speech approximately 6:30 p.m. this evening. Current history of atrial fibrillation. EXAM: CT HEAD WITHOUT CONTRAST TECHNIQUE: Contiguous axial images were obtained from the base of the skull through the vertex without intravenous contrast. COMPARISON:  None. FINDINGS: Ventricular system normal in size and appearance for age. Mild age related cortical atrophy. Old lacunar stroke in the inferior left basal ganglia. No mass lesion. No midline shift. No acute hemorrhage or hematoma. No extra-axial fluid collections. No evidence of acute infarction. No skull fracture or other focal osseous abnormality involving the skull. Near complete opacification of all of the visualized paranasal sinuses. Bilateral mastoid air cells and bilateral middle ear cavities well-aerated. Mild bilateral carotid siphon and vertebral artery atherosclerosis. IMPRESSION: 1. No acute intracranial abnormality. 2. Old lacunar stroke involving the inferior left basal  ganglia. 3. Severe pansinusitis, likely acute on chronic. Critical Value/emergent results were called by telephone at the time of interpretation  on 03/31/2015 at 7:35 pm to Dr. Nicole Kindred, who verbally acknowledged these results. Electronically Signed   By: Evangeline Dakin M.D.   On: 03/31/2015 19:38   Ct Angio Neck W/cm &/or Wo/cm  03/31/2015  CLINICAL DATA:  82 year old male code stroke presentation with slurred speech and left side weakness. Symptoms partially resolved. Initial encounter. EXAM: CT ANGIOGRAPHY HEAD AND NECK TECHNIQUE: Multidetector CT imaging of the head and neck was performed using the standard protocol during bolus administration of intravenous contrast. Multiplanar CT image reconstructions and MIPs were obtained to evaluate the vascular anatomy. Carotid stenosis measurements (when applicable) are obtained utilizing NASCET criteria, using the distal internal carotid diameter as the denominator. CONTRAST:  6mL OMNIPAQUE IOHEXOL 350 MG/ML SOLN COMPARISON:  Head CT without contrast 1920 hours today. FINDINGS: CTA NECK Skeleton: Subtotal paranasal sinus opacification including fluid levels in bubbly opacity in the frontal sinuses, posterior ethmoids, and sphenoid sinuses. Superimposed paranasal sinus periostitis. Tympanic cavities and mastoids are clear. Mild scoliosis and age appropriate degenerative changes in the spine. No acute osseous abnormality identified. Other neck: Negative lung apices. No superior mediastinal lymphadenopathy. Negative thyroid, larynx, pharynx, parapharyngeal spaces, retropharyngeal space, sublingual space, submandibular glands, and parotid glands. No cervical lymphadenopathy. Aortic arch: 3 vessel arch configuration. Very little calcified aortic plaque at the level of the great vessels. Moderate mostly calcified plaque in the distal arch distal to the left subclavian origin. No great vessel origin stenosis. Right carotid system: Minimal right CCA calcified plaque. Mild  to moderate soft and calcified plaque at the right carotid bifurcation but less than 50 % stenosis with respect to the distal vessel at the right ICA origin and bulb. Negative cervical right ICA otherwise. Left carotid system: Mild left CCA calcified plaque without stenosis. Minimal calcified plaque at the left carotid bifurcation and bulb with no stenosis. Otherwise negative cervical left ICA. Vertebral arteries: No proximal subclavian artery stenosis. Calcified plaque at the right vertebral artery origin with mild origin stenosis (series 403, image 111). Soft and calcified plaque at the left vertebral artery origin with moderate to severe stenosis (series 403, image 127 series 401, images 48 and 49). The right vertebral artery is dominant. Both vertebral arteries remain patent to the skullbase with no additional stenosis. CTA HEAD Posterior circulation: Dominant distal right vertebral artery. Both PICA origins are normal and arise early. Bilateral calcified plaque in the vertebral artery V4 segments. Mild stenosis on the right and moderate to severe stenosis of the non dominant distal left vertebral artery. The left vertebral artery then is highly attenuated leading up to the vertebrobasilar junction. Normal patency of the right vertebrobasilar junction. No basilar artery stenosis. Normal SCA and PCA origins. Mild venous contamination about the left PCA. Bilateral PCA branches are within normal limits. Anterior circulation: Right ICA siphon is patent with mild to moderate calcified plaque. Up to mild supra clinoid right ICA stenosis. Normal right ophthalmic artery origin. Patent right ICA terminus. Normal right MCA and ACA origins. Right MCA M1 segment and bifurcation remain patent. There is a high-grade stenosis in a right M2 middle still the middle sylvian division best seen on series 406, image 8. The distal vessel remains patent (same series image 7). Other right MCA branches are within normal limits. Left ICA  siphon is patent with moderate calcified plaque most pronounced in the supraclinoid segment. Mild to moderate left supraclinoid stenosis. Left ICA terminus is patent. Left MCA and ACA origins are normal. Anterior communicating artery is diminutive or absent. Mild bilateral ACA  branch irregularity, no ACA branch occlusion identified. Left MCA M1 segment, bifurcation, and left MCA branches are within normal limits. Venous sinuses: Patent. Anatomic variants: Dominant right vertebral artery. IMPRESSION: 1. Negative for emergent large vessel occlusion, but positive for a right MCA M2 branch lesion best seen on series 406 image 7. There is a ~3 mm flow gap, but preserved distal flow. This was discussed by telephone with Dr. Wallie Char on 03/31/2015 at 0829 hours. 2. Mild for age anterior circulation atherosclerosis in the neck. Up to moderate ICA siphon calcified plaque with mild to moderate supraclinoid stenoses, greater on the left. 3. Nondominant left vertebral artery with moderate to severe stenosis at its origin and in the distal left V4 segment. 4. Dominant right vertebral artery with calcified plaque causing mild stenosis at its origin and right V4 segment. 5. Otherwise negative posterior circulation. 6. Acute on chronic pansinusitis. Electronically Signed   By: Genevie Ann M.D.   On: 03/31/2015 20:35   Mr Brain Wo Contrast  04/01/2015  CLINICAL DATA:  TIA. Slurred speech and left arm weakness last night which has resolved. EXAM: MRI HEAD WITHOUT CONTRAST TECHNIQUE: Multiplanar, multiecho pulse sequences of the brain and surrounding structures were obtained without intravenous contrast. COMPARISON:  CT head and CTA head and neck 03/31/2015 FINDINGS: Small area of cortical hemorrhage in the right parietal lobe. Surrounding white matter edema. No definite restricted diffusion. T1 shortening is present in the area consistent with methemoglobin. Review of the head CT last night reveals subtle hyperdensity in this  area. Note is made of filling defect in the right M2 branch supplying this area suggestive of embolus. Small area of cortical infarct in the high right frontal parietal lobe. Minimal chronic microvascular ischemic change in the white matter. Cystic spaces in the brainstem and basal ganglia most likely due to dilated perivascular spaces rather than chronic lacunar infarction. No other areas of hemorrhage in the brain. Negative for mass or edema. Ventricle size and cerebral volume normal for age. Extensive sinusitis with opacification of paranasal sinuses diffusely Craniocervical junction normal.  Pituitary not enlarged. IMPRESSION: Right parietal cortical hemorrhage which appears recent. There is surrounding white matter edema. No restricted diffusion to suggest acute ischemia. The exact date of the hemorrhage is difficult to determine but could be acute or subacute. This does correspond to an area of stenosis of the right MCA branch which could be an embolus given the patient's atrial fibrillation. Mild chronic microvascular ischemic change. Small cortical infarct right high frontal parietal lobe. Severe chronic sinusitis Electronically Signed   By: Franchot Gallo M.D.   On: 04/01/2015 09:18         Subjective:   Objective: Filed Vitals:   04/01/15 0655 04/01/15 0800 04/01/15 1400 04/01/15 1745  BP: 117/54 121/60 112/61 126/66  Pulse: 60 62 62 62  Temp: 97.8 F (36.6 C) 97.6 F (36.4 C) 98 F (36.7 C) 98.4 F (36.9 C)  TempSrc: Oral Axillary Oral Oral  Resp: 16 16 16 18   Height:      Weight:      SpO2: 95% 96% 96% 94%    Intake/Output Summary (Last 24 hours) at 04/01/15 1934 Last data filed at 04/01/15 1200  Gross per 24 hour  Intake    960 ml  Output    400 ml  Net    560 ml   Weight change:  Exam:   General:  Pt is alert, follows commands appropriately, not in acute distress  HEENT: No icterus,  No thrush, No neck mass, [place]/AT  Cardiovascular: RRR, S1/S2, no rubs, no  gallops  Respiratory: CTA bilaterally, no wheezing, no crackles, no rhonchi  Abdomen: Soft/+BS, non tender, non distended, no guarding  Extremities: No edema, No lymphangitis, No petechiae, No rashes, no synovitis  Data Reviewed: Basic Metabolic Panel:  Recent Labs Lab 03/31/15 1930 03/31/15 1940  NA 134* 137  K 4.5 4.6  CL 102 102  CO2 23  --   GLUCOSE 174* 175*  BUN 31* 37*  CREATININE 1.53* 1.40*  CALCIUM 8.9  --    Liver Function Tests:  Recent Labs Lab 03/31/15 1930  AST 20  ALT 16*  ALKPHOS 82  BILITOT 0.4  PROT 6.5  ALBUMIN 3.6   No results for input(s): LIPASE, AMYLASE in the last 168 hours. No results for input(s): AMMONIA in the last 168 hours. CBC:  Recent Labs Lab 03/31/15 1930 03/31/15 1940  WBC 9.3  --   NEUTROABS 6.4  --   HGB 14.0 15.6  HCT 43.9 46.0  MCV 92.8  --   PLT 228  --    Cardiac Enzymes: No results for input(s): CKTOTAL, CKMB, CKMBINDEX, TROPONINI in the last 168 hours. BNP: Invalid input(s): POCBNP CBG:  Recent Labs Lab 03/31/15 1941  GLUCAP 141*    No results found for this or any previous visit (from the past 240 hour(s)).   Scheduled Meds: . apixaban  5 mg Oral BID  . atenolol  25 mg Oral BID  . atorvastatin  20 mg Oral q1800  . buPROPion  150 mg Oral Daily  . cholecalciferol  1,000 Units Oral Daily  . dofetilide  250 mcg Oral BID  . fluticasone  2 spray Each Nare Daily  . [START ON 04/02/2015] furosemide  20 mg Oral QODAY  . loratadine  10 mg Oral Daily  . montelukast  10 mg Oral QHS  . omega-3 acid ethyl esters  1 g Oral Daily  . pantoprazole  40 mg Oral Daily  . tamsulosin  0.4 mg Oral QPC supper   Continuous Infusions:    Hosanna Betley, DO  [facility] Pager [phone]  If 7PM-7AM, please contact night-coverage [email or web address] Password [facility] 04/01/2015, 7:34 PM

## 2015-04-02 ENCOUNTER — Inpatient Hospital Stay (HOSPITAL_COMMUNITY): Payer: Medicare Other

## 2015-04-02 ENCOUNTER — Inpatient Hospital Stay (HOSPITAL_COMMUNITY): Admission: RE | Admit: 2015-04-02 | Payer: Medicare Other | Source: Ambulatory Visit | Admitting: Nurse Practitioner

## 2015-04-02 DIAGNOSIS — I638 Other cerebral infarction: Secondary | ICD-10-CM

## 2015-04-02 DIAGNOSIS — I481 Persistent atrial fibrillation: Secondary | ICD-10-CM

## 2015-04-02 DIAGNOSIS — I6389 Other cerebral infarction: Secondary | ICD-10-CM | POA: Insufficient documentation

## 2015-04-02 LAB — CBC
HEMATOCRIT: 44.4 % (ref 39.0–52.0)
Hemoglobin: 14.3 g/dL (ref 13.0–17.0)
MCH: 29.7 pg (ref 26.0–34.0)
MCHC: 32.2 g/dL (ref 30.0–36.0)
MCV: 92.3 fL (ref 78.0–100.0)
Platelets: 235 10*3/uL (ref 150–400)
RBC: 4.81 MIL/uL (ref 4.22–5.81)
RDW: 13.3 % (ref 11.5–15.5)
WBC: 3.4 10*3/uL — AB (ref 4.0–10.5)

## 2015-04-02 LAB — BASIC METABOLIC PANEL
Anion gap: 6 (ref 5–15)
BUN: 23 mg/dL — ABNORMAL HIGH (ref 6–20)
CHLORIDE: 104 mmol/L (ref 101–111)
CO2: 26 mmol/L (ref 22–32)
CREATININE: 1.38 mg/dL — AB (ref 0.61–1.24)
Calcium: 8.9 mg/dL (ref 8.9–10.3)
GFR calc non Af Amer: 46 mL/min — ABNORMAL LOW (ref >60)
GFR, EST AFRICAN AMERICAN: 53 mL/min — AB (ref >60)
Glucose, Bld: 107 mg/dL — ABNORMAL HIGH (ref 65–99)
Potassium: 4.5 mmol/L (ref 3.5–5.1)
Sodium: 136 mmol/L (ref 135–145)

## 2015-04-02 LAB — HEMOGLOBIN A1C
Hgb A1c MFr Bld: 6.3 % — ABNORMAL HIGH (ref 4.8–5.6)
Mean Plasma Glucose: 134 mg/dL

## 2015-04-02 LAB — MAGNESIUM: Magnesium: 2 mg/dL (ref 1.7–2.4)

## 2015-04-02 LAB — PROTIME-INR
INR: 2.28 — ABNORMAL HIGH (ref 0.00–1.49)
Prothrombin Time: 24.9 seconds — ABNORMAL HIGH (ref 11.6–15.2)

## 2015-04-02 MED ORDER — APIXABAN 5 MG PO TABS: 5.0000 mg | ORAL_TABLET | Freq: Two times a day (BID) | ORAL | Status: DC

## 2015-04-02 MED ORDER — ATENOLOL 25 MG PO TABS: 25.0000 mg | ORAL_TABLET | Freq: Two times a day (BID) | ORAL | Status: DC

## 2015-04-02 NOTE — Care Management Note (Signed)
Case Management Note  Patient Details  Name: [name] LOHMEYER, MD MRN: [id number] Date of Birth: 1929-02-14  Subjective/Objective:                    Action/Plan: Patient discharging home with self care. Patient being discharged home with Eliquis which is a new medication for him. CM provided him with the 30 day free card and asked him to follow up with his pharmacist on the cost of Eliquis after the first 30 days, and to let his PCP know if cost is to expensive. Bedside RN updated.   Expected Discharge Date:                  Expected Discharge Plan:     In-House Referral:     Discharge planning Services     Post Acute Care Choice:    Choice offered to:     DME Arranged:    DME Agency:     HH Arranged:    HH Agency:     Status of Service:  In process, will continue to follow  Medicare Important Message Given:    Date Medicare IM Given:    Medicare IM give by:    Date Additional Medicare IM Given:    Additional Medicare Important Message give by:     If discussed at [place] of Stay Meetings, dates discussed:    Additional Comments:  Pollie Friar, RN 04/02/2015, 4:54 PM

## 2015-04-02 NOTE — Progress Notes (Signed)
Patient is being d/c home. D/c instructions given and patient verbalized understanding. Patient left unit with family, refused wheel chair.

## 2015-04-02 NOTE — Evaluation (Signed)
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 MRI revealed R parietal hemorrhage.   Clinical Impression   Pt reports he was independent with ADLs and mobility PTA. Currently pt is overall mod I for functional mobility and ADLs. Pt reports he feels like he has returned to baseline; no UE weakness or changes in UE sensation noted with formal testing. Pt planning to d/c home with 24/7 supervision from family. No acute OT needs identified; ready for d/c from OT standpoint and signing off at this time. Thank you for this referral.    Follow Up Recommendations  No OT follow up;Supervision - Intermittent    Equipment Recommendations  None recommended by OT    Recommendations for Other Services       Precautions / Restrictions Precautions Precautions: None Restrictions Weight Bearing Restrictions: No      Mobility Bed Mobility               General bed mobility comments: Pt OOB in chair upon arrival.  Transfers Overall transfer level: Modified independent Equipment used: None Transfers: Sit to/from Stand Sit to Stand: Modified independent (Device/Increase time)         General transfer comment: Sit to stand from chair x 1, toilet x 1. Good hand placement and technique.    Balance Overall balance assessment: No apparent balance deficits (not formally assessed)                                          ADL Overall ADL's : At baseline;Modified independent                                       General ADL Comments: Pts daughter and  wife present for OT eval. Pt reports he feels like he has returned to baseline level with participation in ADLs and functional mobility. Pt currently overall at a mod I level for ADLs and functional mobility with wife available at home to assist as needed.     Vision Vision Assessment?: No apparent visual deficits   Perception     Praxis      Pertinent Vitals/Pain Pain Assessment: No/denies pain     Hand Dominance Right   Extremity/Trunk Assessment Upper Extremity Assessment Upper Extremity Assessment: Overall WFL for tasks assessed   Lower Extremity Assessment Lower Extremity Assessment: Overall WFL for tasks assessed   Cervical / Trunk Assessment Cervical / Trunk Assessment: Normal   Communication Communication Communication: HOH   Cognition Arousal/Alertness: Awake/alert Behavior During Therapy: WFL for tasks assessed/performed Overall Cognitive Status: Within Functional Limits for tasks assessed                     General Comments       Exercises       Shoulder Instructions      Home Living Family/patient expects to be discharged to:: Private residence Living Arrangements: Spouse/significant  other Available Help at Discharge: Family;Available 24 hours/day Type of Home: House Home Access: Stairs to enter [employer] of Steps: 2 Entrance Stairs-Rails: Can reach both Home Layout: Multi-level;Able to live on main level with bedroom/bathroom     Bathroom Shower/Tub: Occupational psychologist: Standard Bathroom Accessibility: Yes   Home Equipment: Grab bars - tub/shower;Shower seat - built in          Prior Functioning/Environment Level of Independence: Independent        Comments: golfs    OT Diagnosis: Generalized weakness   OT Problem List:     OT Treatment/Interventions:      OT Goals(Current goals can be found in the care plan section) Acute Rehab OT Goals Patient Stated Goal: home asap OT Goal Formulation:  All assessment and education complete, DC therapy  OT Frequency:     Barriers to D/C:            Co-evaluation              End of Session    Activity Tolerance: Patient tolerated treatment well Patient left: in chair;with call bell/phone within reach;with family/visitor present   Time: [id number] OT Time Calculation (min): 10 min Charges:  OT General Charges $OT Visit: 1 Procedure OT Evaluation $OT Eval Low Complexity: 1 Procedure G-Codes:     Binnie Kand M.S., OTR/L [phone]  04/02/2015, 12:03 PM

## 2015-04-02 NOTE — Discharge Summary (Addendum)
Physician Discharge Summary  [name], MD  [id number]  DOB: 1929-03-05  DOA: 03/31/2015  PCP: [name], MD  Admit date: 03/31/2015 Discharge date: 04/02/2015  Time spent: Greater than 30 minutes  Recommendations for Outpatient Follow-up:  1. [facility] on 04/07/15 at 9:30 AM 2. Dr. Antony Contras, Neurology in 2 months. 3. Coumadin clinic on 04/03/15 at 1:15 PM. To be seen with repeat labs (PT, INR, CBC & BMP). Coumadin clinic will advise patient as to timing of starting Eliquis when INR <2. 4. Dr. Stevie Kern, PCP in 1 week.  Discharge Diagnoses:  Principal Problem:   TIA (transient ischemic attack) Active Problems:   Hyperlipidemia   Essential hypertension   CAD S/P OM PCI 2010   Long term current use of Coumadin   Diastolic dysfunction   Paroxysmal atrial fibrillation (HCC)   Syncope and collapse   Hypersensitivity reaction   Persistent atrial fibrillation (HCC)   ICH (intracerebral hemorrhage) ([place])   Discharge Condition: Improved & Stable  Diet recommendation: Heart healthy diet.  Filed Weights   03/31/15 1938 03/31/15 2308  Weight: 76.7 kg (169 lb 1.5 oz) 72.303 kg (159 lb 6.4 oz)    History of present illness:  82 y.o. Male, retired [employer], with a h/o persistent atrial fibrillation on coumadin, CAD, hypertension, hyperlipidemia and asthma who presented on the day of admission with weakness and syncope.Patient went to bathroom and wife heard a thump shortly after. Pt found on the floor unresponsive. Pt quickly became arousable & wife noted slurred speech and left facial droop left arm weakness. EMS was activated, and code stroke was activated. Initial CT of the brain was negative for acute findings. However, MRI of the brain showed right parietal cortical hemorrhage, acute versus subacute. Neurology was consulted and full stroke workup was undertaken.  Hospital Course:   Hemorrhagic right pareital infarct secondary to  known atrial fibrillation  - Resultant no deficits - CT head: Negative for acute findings. - MRI brain: Right parietal cortical hemorrhage which appears recent. - CTA head and neck: no large vessel occlusion, R M2 lesion, L V4 severe stenosis, dom RVA. - 2-D echo: Not ordered. 2-D echo from 08/14/14: LVEF 60-65 percent and grade 2 diastolic dysfunction moderate MR and TR and moderate pulmonary hypertension - LDL 32 - Hemoglobin A1C : 6.3 -PT/OT: Did not recommend any outpatient follow-up. - Repeat head CT reviewed on day of discharge with stroke M.D. Dr. Jaynee Eagles, who indicated no change and recommended starting Apixaban when INR <2 (INR today is 2.28). Coumadin discontinued indefinitely. - Neurology was consulted and assisted with evaluation and management of acute stroke.  Syncope -consulted cardiology in setting of Afib -no further workup for now  Persistent Afib -rate controlled -CHADSVASc--5 - As per cardiology: Patient has persistent A. fib and has previously failed medical therapy with Multraq. He was started on Tikosyn recently and has had some A. fib post loading but feels that his burden has improved. Cardiology recommended continuing Tikosyn for now and QTC stable. Cardiology aware of plan per neurology of changing warfarin to Eliquis. - After extensive discussion with stroke M.D. Dr. Jaynee Eagles and pharmacist today, plan is for discharging patient today with outpatient follow-up with Coumadin clinic tomorrow with repeat INR and starting Apixaban when INR <2. Coumadin discontinued indefinitely. No aspirin recommended at this time. Stroke M.D. has discussed with patient today.  Hypersensitivity reaction - pt developed hives on afternoon 04/01/15 - ??delayed reaction to ASA or IV dye - treated  with benadryl 25mg  IV x 2 and pepcid 20 mg x1 - Resolved.  HTN - controlled - continue atenolol  Hyperlipidemia - LDL 32 - continue atorvastatin  CAD - no angina - angioplasty and stent  2010 - Seen by Cardiology. Stable  Stage III chronic kidney disease - Baseline creatinine fluctuating but may be in the 1.1-1.3 range. Admitting creatinine was 1.53 but has improved to 1.3. Outpatient follow-up.  Consultations:  Neurology  Cardiology  Procedures:  None   Discharge Exam:  Complaints: Denies complaints and is anxious to go home.  Filed Vitals:   04/02/15 0108 04/02/15 0518 04/02/15 0958 04/02/15 1450  BP: 109/54 120/57 128/59 135/60  Pulse: 54 63 63 56  Temp: 98.1 F (36.7 C) 97.4 F (36.3 C)  97.7 F (36.5 C)  TempSrc: Oral Oral Oral Oral  Resp: 16 18 18 18   Height:      Weight:      SpO2: 95% 95% 95% 100%    General exam: Pleasant elderly male sitting up comfortably in chair this morning. Respiratory system: Clear. No increased work of breathing. Cardiovascular system: S1 & S2 heard, RRR. No JVD, murmurs, gallops, clicks or pedal edema. Telemetry: SB in the 50s-NSR. Nonsustained SVT versus A. fib (19-20 beats) at 8 AM this morning. Gastrointestinal system: Abdomen is nondistended, soft and nontender. Normal bowel sounds heard. Central nervous system: Alert and oriented. No focal neurological deficits. Extremities: Symmetric 5 x 5 power.  Discharge Instructions      Discharge Instructions    (HEART FAILURE PATIENTS) Call MD:  Anytime you have any of the following symptoms: 1) 3 pound weight gain in 24 hours or 5 pounds in 1 week 2) shortness of breath, with or without a dry hacking cough 3) swelling in the hands, feet or stomach 4) if you have to sleep on extra pillows at night in order to breathe.    Complete by:  As directed      Ambulatory referral to Neurology    Complete by:  As directed   Dr. Leonie Man requests follow up for this patient in 2 months.     Call MD for:  difficulty breathing, headache or visual disturbances    Complete by:  As directed      Call MD for:  extreme fatigue    Complete by:  As directed      Call MD for:  persistant  dizziness or light-headedness    Complete by:  As directed      Call MD for:  persistant nausea and vomiting    Complete by:  As directed      Call MD for:  severe uncontrolled pain    Complete by:  As directed      Call MD for:  temperature >100.4    Complete by:  As directed      Call MD for:    Complete by:  As directed   Strokelike symptoms.     Diet - low sodium heart healthy    Complete by:  As directed      Increase activity slowly    Complete by:  As directed             Medication List    STOP taking these medications        warfarin 5 MG tablet  Commonly known as:  COUMADIN      TAKE these medications        acetaminophen 325 MG tablet  Commonly known as:  TYLENOL  Take 2 tablets (650 mg total) by mouth every 4 (four) hours as needed for headache or mild pain.     apixaban 5 MG Tabs tablet  Commonly known as:  ELIQUIS  Take 1 tablet (5 mg total) by mouth 2 (two) times daily. Start as per instructions by your Coumadin Clinic. To be started when INR is less than 2.     atenolol 25 MG tablet  Commonly known as:  TENORMIN  Take 1 tablet (25 mg total) by mouth 2 (two) times daily.     atorvastatin 20 MG tablet  Commonly known as:  LIPITOR  Take 1 tablet (20 mg total) by mouth daily.     buPROPion 150 MG 12 hr tablet  Commonly known as:  WELLBUTRIN SR  Take 1 tablet (150 mg total) by mouth daily.     cetirizine 10 MG tablet  Commonly known as:  ZYRTEC  Take 10 mg by mouth daily.     dofetilide 250 MCG capsule  Commonly known as:  TIKOSYN  Take 1 capsule (250 mcg total) by mouth 2 (two) times daily.     Fish Oil 1000 MG Caps  Take 1,000 mg by mouth daily.     fluticasone 110 MCG/ACT inhaler  Commonly known as:  FLOVENT HFA  Inhale 1 puff into the lungs 2 (two) times daily.     fluticasone 50 MCG/ACT nasal spray  Commonly known as:  FLONASE  Place 2 sprays into both nostrils daily.     LASIX 20 MG tablet  Generic drug:  furosemide  Take 20 mg by  mouth every other day. 1 tablet my mouth every other day.     montelukast 10 MG tablet  Commonly known as:  SINGULAIR  TAKE 1 TABLET BY MOUTH ONCE DAILY     nitroGLYCERIN 0.4 MG SL tablet  Commonly known as:  NITROSTAT  Place 1 tablet (0.4 mg total) under the tongue every 5 (five) minutes x 3 doses as needed for chest pain.     omeprazole 20 MG tablet  Commonly known as:  PRILOSEC OTC  Take 1 tablet (20 mg total) by mouth daily.     PROAIR HFA 108 (90 Base) MCG/ACT inhaler  Generic drug:  albuterol  INHALE 2 PUFFS INTO THE LUNGS EVERY 6 HOURS AS NEEDED FOR WHEEZING     tamsulosin 0.4 MG Caps capsule  Commonly known as:  FLOMAX  TAKE ONE CAPSULE BY MOUTH DAILY     Vitamin D3 400 units Caps  Take 400 Units by mouth daily.       Follow-up Information    Follow up with [facility] On 04/07/2015.   Specialty:  Cardiology   Why:  at 9:30AM    Contact information:   [id number] [place] [phone]      Follow up with SETHI,PRAMOD, MD. Schedule an appointment as soon as possible for a visit in 2 months.   Specialties:  Neurology, Radiology   Contact information:   [address] [place] [address] [phone]       Follow up with Coumadin clinic On 04/03/2015.   Why:  To be seen for repeat labs (PT, INR, CBC & BMP). Start Eliquis if INR is less than 2.      Follow up with TODD,JEFFREY ALLEN, MD. Schedule an appointment as soon as possible for a visit in 1 week.   Specialty:  Family Medicine  Contact information:   [place] [address] [phone]       Get Medicines reviewed and adjusted: Please take all your medications with you for your next visit with your Primary MD  Please request your Primary MD to go over all hospital tests and procedure/radiological results at the follow up. Please ask your Primary MD to get all Hospital records sent to his/her  office.  If you experience worsening of your admission symptoms, develop shortness of breath, life threatening emergency, suicidal or homicidal thoughts you must seek medical attention immediately by calling 911 or calling your MD immediately if symptoms less severe.  You must read complete instructions/literature along with all the possible adverse reactions/side effects for all the Medicines you take and that have been prescribed to you. Take any new Medicines after you have completely understood and accept all the possible adverse reactions/side effects.   Do not drive when taking pain medications.   Do not take more than prescribed Pain, Sleep and Anxiety Medications  Special Instructions: If you have smoked or chewed Tobacco in the last 2 yrs please stop smoking, stop any regular Alcohol and or any Recreational drug use.  Wear Seat belts while driving.  Please note  You were cared for by a hospitalist during your hospital stay. Once you are discharged, your primary care physician will handle any further medical issues. Please note that NO REFILLS for any discharge medications will be authorized once you are discharged, as it is imperative that you return to your primary care physician (or establish a relationship with a primary care physician if you do not have one) for your aftercare needs so that they can reassess your need for medications and monitor your lab values.    The results of significant diagnostics from this hospitalization (including imaging, microbiology, ancillary and laboratory) are listed below for reference.    Significant Diagnostic Studies: Ct Angio Head W/cm &/or Wo Cm  03/31/2015  CLINICAL DATA:  82 year old male code stroke presentation with slurred speech and left side weakness. Symptoms partially resolved. Initial encounter. EXAM: CT ANGIOGRAPHY HEAD AND NECK TECHNIQUE: Multidetector CT imaging of the head and neck was performed using the standard protocol  during bolus administration of intravenous contrast. Multiplanar CT image reconstructions and MIPs were obtained to evaluate the vascular anatomy. Carotid stenosis measurements (when applicable) are obtained utilizing NASCET criteria, using the distal internal carotid diameter as the denominator. CONTRAST:  2mL OMNIPAQUE IOHEXOL 350 MG/ML SOLN COMPARISON:  Head CT without contrast 1920 hours today. FINDINGS: CTA NECK Skeleton: Subtotal paranasal sinus opacification including fluid levels in bubbly opacity in the frontal sinuses, posterior ethmoids, and sphenoid sinuses. Superimposed paranasal sinus periostitis. Tympanic cavities and mastoids are clear. Mild scoliosis and age appropriate degenerative changes in the spine. No acute osseous abnormality identified. Other neck: Negative lung apices. No superior mediastinal lymphadenopathy. Negative thyroid, larynx, pharynx, parapharyngeal spaces, retropharyngeal space, sublingual space, submandibular glands, and parotid glands. No cervical lymphadenopathy. Aortic arch: 3 vessel arch configuration. Very little calcified aortic plaque at the level of the great vessels. Moderate mostly calcified plaque in the distal arch distal to the left subclavian origin. No great vessel origin stenosis. Right carotid system: Minimal right CCA calcified plaque. Mild to moderate soft and calcified plaque at the right carotid bifurcation but less than 50 % stenosis with respect to the distal vessel at the right ICA origin and bulb. Negative cervical right ICA otherwise. Left carotid system: Mild left CCA calcified plaque without  stenosis. Minimal calcified plaque at the left carotid bifurcation and bulb with no stenosis. Otherwise negative cervical left ICA. Vertebral arteries: No proximal subclavian artery stenosis. Calcified plaque at the right vertebral artery origin with mild origin stenosis (series 403, image 111). Soft and calcified plaque at the left vertebral artery origin with  moderate to severe stenosis (series 403, image 127 series 401, images 48 and 49). The right vertebral artery is dominant. Both vertebral arteries remain patent to the skullbase with no additional stenosis. CTA HEAD Posterior circulation: Dominant distal right vertebral artery. Both PICA origins are normal and arise early. Bilateral calcified plaque in the vertebral artery V4 segments. Mild stenosis on the right and moderate to severe stenosis of the non dominant distal left vertebral artery. The left vertebral artery then is highly attenuated leading up to the vertebrobasilar junction. Normal patency of the right vertebrobasilar junction. No basilar artery stenosis. Normal SCA and PCA origins. Mild venous contamination about the left PCA. Bilateral PCA branches are within normal limits. Anterior circulation: Right ICA siphon is patent with mild to moderate calcified plaque. Up to mild supra clinoid right ICA stenosis. Normal right ophthalmic artery origin. Patent right ICA terminus. Normal right MCA and ACA origins. Right MCA M1 segment and bifurcation remain patent. There is a high-grade stenosis in a right M2 middle still the middle sylvian division best seen on series 406, image 8. The distal vessel remains patent (same series image 7). Other right MCA branches are within normal limits. Left ICA siphon is patent with moderate calcified plaque most pronounced in the supraclinoid segment. Mild to moderate left supraclinoid stenosis. Left ICA terminus is patent. Left MCA and ACA origins are normal. Anterior communicating artery is diminutive or absent. Mild bilateral ACA branch irregularity, no ACA branch occlusion identified. Left MCA M1 segment, bifurcation, and left MCA branches are within normal limits. Venous sinuses: Patent. Anatomic variants: Dominant right vertebral artery. IMPRESSION: 1. Negative for emergent large vessel occlusion, but positive for a right MCA M2 branch lesion best seen on series 406 image  7. There is a ~3 mm flow gap, but preserved distal flow. This was discussed by telephone with Dr. Wallie Char on 03/31/2015 at 0829 hours. 2. Mild for age anterior circulation atherosclerosis in the neck. Up to moderate ICA siphon calcified plaque with mild to moderate supraclinoid stenoses, greater on the left. 3. Nondominant left vertebral artery with moderate to severe stenosis at its origin and in the distal left V4 segment. 4. Dominant right vertebral artery with calcified plaque causing mild stenosis at its origin and right V4 segment. 5. Otherwise negative posterior circulation. 6. Acute on chronic pansinusitis. Electronically Signed   By: Genevie Ann M.D.   On: 03/31/2015 20:35   Ct Head Wo Contrast  04/02/2015  CLINICAL DATA:  Intra cerebral hemorrhage follow-up. EXAM: CT HEAD WITHOUT CONTRAST TECHNIQUE: Contiguous axial images were obtained from the base of the skull through the vertex without intravenous contrast. COMPARISON:  Head MRI 04/01/2015 and CT 03/31/2015 FINDINGS: There is subtle cortical hyperattenuation in the right parietal lobe corresponding to the small area of hemorrhage described on yesterday's MRI. Mild subcortical white matter edema is again seen without significant associated mass effect. There is no evidence of new intracranial hemorrhage, acute large territory infarct, mass, midline shift, or extra-axial fluid collection. A tiny, chronic high right frontal lobe cortical infarct and mild chronic small vessel cerebral white matter disease are again noted. There is mild generalized cerebral atrophy. Prior bilateral cataract extraction  is noted. Extensive, near complete paranasal sinus opacification is again seen. The mastoid air cells are clear. Calcified atherosclerosis is noted at the skullbase. IMPRESSION: Subtle abnormality in the right parietal lobe corresponding to small cortical hemorrhage and mild edema, not significantly changed from yesterday's MRI. Electronically Signed    By: Logan Bores M.D.   On: 04/02/2015 08:22   Ct Head Wo Contrast  03/31/2015  CLINICAL DATA:  82 year old with acute onset of slurred speech approximately 6:30 p.m. this evening. Current history of atrial fibrillation. EXAM: CT HEAD WITHOUT CONTRAST TECHNIQUE: Contiguous axial images were obtained from the base of the skull through the vertex without intravenous contrast. COMPARISON:  None. FINDINGS: Ventricular system normal in size and appearance for age. Mild age related cortical atrophy. Old lacunar stroke in the inferior left basal ganglia. No mass lesion. No midline shift. No acute hemorrhage or hematoma. No extra-axial fluid collections. No evidence of acute infarction. No skull fracture or other focal osseous abnormality involving the skull. Near complete opacification of all of the visualized paranasal sinuses. Bilateral mastoid air cells and bilateral middle ear cavities well-aerated. Mild bilateral carotid siphon and vertebral artery atherosclerosis. IMPRESSION: 1. No acute intracranial abnormality. 2. Old lacunar stroke involving the inferior left basal ganglia. 3. Severe pansinusitis, likely acute on chronic. Critical Value/emergent results were called by telephone at the time of interpretation on 03/31/2015 at 7:35 pm to Dr. Nicole Kindred, who verbally acknowledged these results. Electronically Signed   By: Evangeline Dakin M.D.   On: 03/31/2015 19:38   Ct Angio Neck W/cm &/or Wo/cm  03/31/2015  CLINICAL DATA:  82 year old male code stroke presentation with slurred speech and left side weakness. Symptoms partially resolved. Initial encounter. EXAM: CT ANGIOGRAPHY HEAD AND NECK TECHNIQUE: Multidetector CT imaging of the head and neck was performed using the standard protocol during bolus administration of intravenous contrast. Multiplanar CT image reconstructions and MIPs were obtained to evaluate the vascular anatomy. Carotid stenosis measurements (when applicable) are obtained utilizing NASCET  criteria, using the distal internal carotid diameter as the denominator. CONTRAST:  52mL OMNIPAQUE IOHEXOL 350 MG/ML SOLN COMPARISON:  Head CT without contrast 1920 hours today. FINDINGS: CTA NECK Skeleton: Subtotal paranasal sinus opacification including fluid levels in bubbly opacity in the frontal sinuses, posterior ethmoids, and sphenoid sinuses. Superimposed paranasal sinus periostitis. Tympanic cavities and mastoids are clear. Mild scoliosis and age appropriate degenerative changes in the spine. No acute osseous abnormality identified. Other neck: Negative lung apices. No superior mediastinal lymphadenopathy. Negative thyroid, larynx, pharynx, parapharyngeal spaces, retropharyngeal space, sublingual space, submandibular glands, and parotid glands. No cervical lymphadenopathy. Aortic arch: 3 vessel arch configuration. Very little calcified aortic plaque at the level of the great vessels. Moderate mostly calcified plaque in the distal arch distal to the left subclavian origin. No great vessel origin stenosis. Right carotid system: Minimal right CCA calcified plaque. Mild to moderate soft and calcified plaque at the right carotid bifurcation but less than 50 % stenosis with respect to the distal vessel at the right ICA origin and bulb. Negative cervical right ICA otherwise. Left carotid system: Mild left CCA calcified plaque without stenosis. Minimal calcified plaque at the left carotid bifurcation and bulb with no stenosis. Otherwise negative cervical left ICA. Vertebral arteries: No proximal subclavian artery stenosis. Calcified plaque at the right vertebral artery origin with mild origin stenosis (series 403, image 111). Soft and calcified plaque at the left vertebral artery origin with moderate to severe stenosis (series 403, image 127 series 401, images 48 and  24). The right vertebral artery is dominant. Both vertebral arteries remain patent to the skullbase with no additional stenosis. CTA HEAD Posterior  circulation: Dominant distal right vertebral artery. Both PICA origins are normal and arise early. Bilateral calcified plaque in the vertebral artery V4 segments. Mild stenosis on the right and moderate to severe stenosis of the non dominant distal left vertebral artery. The left vertebral artery then is highly attenuated leading up to the vertebrobasilar junction. Normal patency of the right vertebrobasilar junction. No basilar artery stenosis. Normal SCA and PCA origins. Mild venous contamination about the left PCA. Bilateral PCA branches are within normal limits. Anterior circulation: Right ICA siphon is patent with mild to moderate calcified plaque. Up to mild supra clinoid right ICA stenosis. Normal right ophthalmic artery origin. Patent right ICA terminus. Normal right MCA and ACA origins. Right MCA M1 segment and bifurcation remain patent. There is a high-grade stenosis in a right M2 middle still the middle sylvian division best seen on series 406, image 8. The distal vessel remains patent (same series image 7). Other right MCA branches are within normal limits. Left ICA siphon is patent with moderate calcified plaque most pronounced in the supraclinoid segment. Mild to moderate left supraclinoid stenosis. Left ICA terminus is patent. Left MCA and ACA origins are normal. Anterior communicating artery is diminutive or absent. Mild bilateral ACA branch irregularity, no ACA branch occlusion identified. Left MCA M1 segment, bifurcation, and left MCA branches are within normal limits. Venous sinuses: Patent. Anatomic variants: Dominant right vertebral artery. IMPRESSION: 1. Negative for emergent large vessel occlusion, but positive for a right MCA M2 branch lesion best seen on series 406 image 7. There is a ~3 mm flow gap, but preserved distal flow. This was discussed by telephone with Dr. Wallie Char on 03/31/2015 at 0829 hours. 2. Mild for age anterior circulation atherosclerosis in the neck. Up to moderate  ICA siphon calcified plaque with mild to moderate supraclinoid stenoses, greater on the left. 3. Nondominant left vertebral artery with moderate to severe stenosis at its origin and in the distal left V4 segment. 4. Dominant right vertebral artery with calcified plaque causing mild stenosis at its origin and right V4 segment. 5. Otherwise negative posterior circulation. 6. Acute on chronic pansinusitis. Electronically Signed   By: Genevie Ann M.D.   On: 03/31/2015 20:35   Mr Brain Wo Contrast  04/01/2015  CLINICAL DATA:  TIA. Slurred speech and left arm weakness last night which has resolved. EXAM: MRI HEAD WITHOUT CONTRAST TECHNIQUE: Multiplanar, multiecho pulse sequences of the brain and surrounding structures were obtained without intravenous contrast. COMPARISON:  CT head and CTA head and neck 03/31/2015 FINDINGS: Small area of cortical hemorrhage in the right parietal lobe. Surrounding white matter edema. No definite restricted diffusion. T1 shortening is present in the area consistent with methemoglobin. Review of the head CT last night reveals subtle hyperdensity in this area. Note is made of filling defect in the right M2 branch supplying this area suggestive of embolus. Small area of cortical infarct in the high right frontal parietal lobe. Minimal chronic microvascular ischemic change in the white matter. Cystic spaces in the brainstem and basal ganglia most likely due to dilated perivascular spaces rather than chronic lacunar infarction. No other areas of hemorrhage in the brain. Negative for mass or edema. Ventricle size and cerebral volume normal for age. Extensive sinusitis with opacification of paranasal sinuses diffusely Craniocervical junction normal.  Pituitary not enlarged. IMPRESSION: Right parietal cortical hemorrhage which appears  recent. There is surrounding white matter edema. No restricted diffusion to suggest acute ischemia. The exact date of the hemorrhage is difficult to determine but  could be acute or subacute. This does correspond to an area of stenosis of the right MCA branch which could be an embolus given the patient's atrial fibrillation. Mild chronic microvascular ischemic change. Small cortical infarct right high frontal parietal lobe. Severe chronic sinusitis Electronically Signed   By: Franchot Gallo M.D.   On: 04/01/2015 09:18    Microbiology: No results found for this or any previous visit (from the past 240 hour(s)).   Labs: Basic Metabolic Panel:  Recent Labs Lab 03/31/15 1930 03/31/15 1940 04/02/15 0515  NA 134* 137 136  K 4.5 4.6 4.5  CL 102 102 104  CO2 23  --  26  GLUCOSE 174* 175* 107*  BUN 31* 37* 23*  CREATININE 1.53* 1.40* 1.38*  CALCIUM 8.9  --  8.9  MG  --   --  2.0   Liver Function Tests:  Recent Labs Lab 03/31/15 1930  AST 20  ALT 16*  ALKPHOS 82  BILITOT 0.4  PROT 6.5  ALBUMIN 3.6   No results for input(s): LIPASE, AMYLASE in the last 168 hours. No results for input(s): AMMONIA in the last 168 hours. CBC:  Recent Labs Lab 03/31/15 1930 03/31/15 1940 04/02/15 0515  WBC 9.3  --  3.4*  NEUTROABS 6.4  --   --   HGB 14.0 15.6 14.3  HCT 43.9 46.0 44.4  MCV 92.8  --  92.3  PLT 228  --  235   Cardiac Enzymes: No results for input(s): CKTOTAL, CKMB, CKMBINDEX, TROPONINI in the last 168 hours. BNP: BNP (last 3 results) No results for input(s): BNP in the last 8760 hours.  ProBNP (last 3 results) No results for input(s): PROBNP in the last 8760 hours.  CBG:  Recent Labs Lab 03/31/15 1941  GLUCAP 141*    Discussed extensively with patient's daughter at bedside. Updated care and answered questions.   Signed:  Vernell Leep, MD, FACP, FHM. [facility] Pager [phone]  If 7PM-7AM, please contact night-coverage [email or web address] Password [facility] 04/02/2015, 4:04 PM

## 2015-04-02 NOTE — Discharge Instructions (Signed)
Stroke Prevention Some medical conditions and behaviors are associated with an increased chance of having a stroke. You may prevent a stroke by making healthy choices and managing medical conditions. HOW CAN I REDUCE MY RISK OF HAVING A STROKE?   Stay physically active. Get at least 30 minutes of activity on most or all days.  Do not smoke. It may also be helpful to avoid exposure to secondhand smoke.  Limit alcohol use. Moderate alcohol use is considered to be:  No more than 2 drinks per day for men.  No more than 1 drink per day for nonpregnant women.  Eat healthy foods. This involves:  Eating 5 or more servings of fruits and vegetables a day.  Making dietary changes that address high blood pressure (hypertension), high cholesterol, diabetes, or obesity.  Manage your cholesterol levels.  Making food choices that are high in fiber and low in saturated fat, trans fat, and cholesterol may control cholesterol levels.  Take any prescribed medicines to control cholesterol as directed by your health care provider.  Manage your diabetes.  Controlling your carbohydrate and sugar intake is recommended to manage diabetes.  Take any prescribed medicines to control diabetes as directed by your health care provider.  Control your hypertension.  Making food choices that are low in salt (sodium), saturated fat, trans fat, and cholesterol is recommended to manage hypertension.  Ask your health care provider if you need treatment to lower your blood pressure. Take any prescribed medicines to control hypertension as directed by your health care provider.  If you are 64-7 years of age, have your blood pressure checked every 3-5 years. If you are 41 years of age or older, have your blood pressure checked every year.  Maintain a healthy weight.  Reducing calorie intake and making food choices that are low in sodium, saturated fat, trans fat, and cholesterol are recommended to manage  weight.  Stop drug abuse.  Avoid taking birth control pills.  Talk to your health care provider about the risks of taking birth control pills if you are over 23 years old, smoke, get migraines, or have ever had a blood clot.  Get evaluated for sleep disorders (sleep apnea).  Talk to your health care provider about getting a sleep evaluation if you snore a lot or have excessive sleepiness.  Take medicines only as directed by your health care provider.  For some people, aspirin or blood thinners (anticoagulants) are helpful in reducing the risk of forming abnormal blood clots that can lead to stroke. If you have the irregular heart rhythm of atrial fibrillation, you should be on a blood thinner unless there is a good reason you cannot take them.  Understand all your medicine instructions.  Make sure that other conditions (such as anemia or atherosclerosis) are addressed. SEEK IMMEDIATE MEDICAL CARE IF:   You have sudden weakness or numbness of the face, arm, or leg, especially on one side of the body.  Your face or eyelid droops to one side.  You have sudden confusion.  You have trouble speaking (aphasia) or understanding.  You have sudden trouble seeing in one or both eyes.  You have sudden trouble walking.  You have dizziness.  You have a loss of balance or coordination.  You have a sudden, severe headache with no known cause.  You have new chest pain or an irregular heartbeat. Any of these symptoms may represent a serious problem that is an emergency. Do not wait to see if the symptoms will  go away. Get medical help at once. Call your local emergency services (911 in [place] Do not drive yourself to the hospital.   This information is not intended to replace advice given to you by your health care provider. Make sure you discuss any questions you have with your health care provider.   Document Released: 03/04/2004 Document Revised: 02/15/2014 Document Reviewed:  07/28/2012 Elsevier Interactive Patient Education 2016 Elsevier Inc.  Apixaban oral tablets What is this medicine? APIXABAN (a PIX a ban) is an anticoagulant (blood thinner). It is used to lower the chance of stroke in people with a medical condition called atrial fibrillation. It is also used to treat or prevent blood clots in the lungs or in the veins. This medicine may be used for other purposes; ask your health care provider or pharmacist if you have questions. What should I tell my health care provider before I take this medicine? They need to know if you have any of these conditions: -bleeding disorders -bleeding in the brain -blood in your stools (black or tarry stools) or if you have blood in your vomit -history of stomach bleeding -kidney disease -liver disease -mechanical heart valve -an unusual or allergic reaction to apixaban, other medicines, foods, dyes, or preservatives -pregnant or trying to get pregnant -breast-feeding How should I use this medicine? Take this medicine by mouth with a glass of water. Follow the directions on the prescription label. You can take it with or without food. If it upsets your stomach, take it with food. Take your medicine at regular intervals. Do not take it more often than directed. Do not stop taking except on your doctor's advice. Stopping this medicine may increase your risk of a blot clot. Be sure to refill your prescription before you run out of medicine. Talk to your pediatrician regarding the use of this medicine in children. Special care may be needed. Overdosage: If you think you have taken too much of this medicine contact a poison control center or emergency room at once. NOTE: This medicine is only for you. Do not share this medicine with others. What if I miss a dose? If you miss a dose, take it as soon as you can. If it is almost time for your next dose, take only that dose. Do not take double or extra doses. What may interact  with this medicine? This medicine may interact with the following: -aspirin and aspirin-like medicines -certain medicines for fungal infections like ketoconazole and itraconazole -certain medicines for seizures like carbamazepine and phenytoin -certain medicines that treat or prevent blood clots like warfarin, enoxaparin, and dalteparin -clarithromycin -NSAIDs, medicines for pain and inflammation, like ibuprofen or naproxen -rifampin -ritonavir -St. John's wort This list may not describe all possible interactions. Give your health care provider a list of all the medicines, herbs, non-prescription drugs, or dietary supplements you use. Also tell them if you smoke, drink alcohol, or use illegal drugs. Some items may interact with your medicine. What should I watch for while using this medicine? Notify your doctor or health care professional and seek emergency treatment if you develop breathing problems; changes in vision; chest pain; severe, sudden headache; pain, swelling, warmth in the leg; trouble speaking; sudden numbness or weakness of the face, arm, or leg. These can be signs that your condition has gotten worse. If you are going to have surgery, tell your doctor or health care professional that you are taking this medicine. Tell your health care professional that you use this medicine before  you have a spinal or epidural procedure. Sometimes people who take this medicine have bleeding problems around the spine when they have a spinal or epidural procedure. This bleeding is very rare. If you have a spinal or epidural procedure while on this medicine, call your health care professional immediately if you have back pain, numbness or tingling (especially in your legs and feet), muscle weakness, paralysis, or loss of bladder or bowel control. Avoid sports and activities that might cause injury while you are using this medicine. Severe falls or injuries can cause unseen bleeding. Be careful when using  sharp tools or knives. Consider using an Copy. Take special care brushing or flossing your teeth. Report any injuries, bruising, or red spots on the skin to your doctor or health care professional. What side effects may I notice from receiving this medicine? Side effects that you should report to your doctor or health care professional as soon as possible: -allergic reactions like skin rash, itching or hives, swelling of the face, lips, or tongue -signs and symptoms of bleeding such as bloody or black, tarry stools; red or dark-brown urine; spitting up blood or brown material that looks like coffee grounds; red spots on the skin; unusual bruising or bleeding from the eye, gums, or nose This list may not describe all possible side effects. Call your doctor for medical advice about side effects. You may report side effects to FDA at 1-800-FDA-1088. Where should I keep my medicine? Keep out of the reach of children. Store at room temperature between 20 and 25 degrees C (68 and 77 degrees F). Throw away any unused medicine after the expiration date. NOTE: This sheet is a summary. It may not cover all possible information. If you have questions about this medicine, talk to your doctor, pharmacist, or health care provider.    2016, Elsevier/Gold Standard. (2012-09-29 11:59:24)

## 2015-04-02 NOTE — Progress Notes (Signed)
Workup complete. The stroke team will sign off at this time. Please call if we can be of further service. Neurology follow-up with Dr. Leonie Man in 2 months.  Mikey Bussing PA-C [facility] Pager [phone] 04/02/2015, 10:17 AM

## 2015-04-03 ENCOUNTER — Ambulatory Visit (INDEPENDENT_AMBULATORY_CARE_PROVIDER_SITE_OTHER): Payer: Medicare Other | Admitting: Pharmacist

## 2015-04-03 DIAGNOSIS — I4891 Unspecified atrial fibrillation: Secondary | ICD-10-CM

## 2015-04-03 DIAGNOSIS — Z7901 Long term (current) use of anticoagulants: Secondary | ICD-10-CM

## 2015-04-03 DIAGNOSIS — Z5181 Encounter for therapeutic drug level monitoring: Secondary | ICD-10-CM | POA: Diagnosis not present

## 2015-04-03 LAB — POCT INR: INR: 1.5

## 2015-04-07 ENCOUNTER — Encounter (HOSPITAL_COMMUNITY): Payer: Self-pay | Admitting: Nurse Practitioner

## 2015-04-07 ENCOUNTER — Ambulatory Visit (HOSPITAL_COMMUNITY)
Admit: 2015-04-07 | Discharge: 2015-04-07 | Disposition: A | Discharge status: Home / Self Care | Payer: Medicare Other | Source: Ambulatory Visit | Attending: Nurse Practitioner | Admitting: Nurse Practitioner

## 2015-04-07 VITALS — BP 118/62 | HR 52 | Ht 70.0 in | Wt 165.0 lb

## 2015-04-07 DIAGNOSIS — I252 Old myocardial infarction: Secondary | ICD-10-CM | POA: Diagnosis not present

## 2015-04-07 DIAGNOSIS — Z8249 Family history of ischemic heart disease and other diseases of the circulatory system: Secondary | ICD-10-CM | POA: Diagnosis not present

## 2015-04-07 DIAGNOSIS — Z8673 Personal history of transient ischemic attack (TIA), and cerebral infarction without residual deficits: Secondary | ICD-10-CM | POA: Insufficient documentation

## 2015-04-07 DIAGNOSIS — I4891 Unspecified atrial fibrillation: Secondary | ICD-10-CM | POA: Insufficient documentation

## 2015-04-07 DIAGNOSIS — E785 Hyperlipidemia, unspecified: Secondary | ICD-10-CM | POA: Diagnosis not present

## 2015-04-07 DIAGNOSIS — I2510 Atherosclerotic heart disease of native coronary artery without angina pectoris: Secondary | ICD-10-CM | POA: Diagnosis not present

## 2015-04-07 DIAGNOSIS — Z825 Family history of asthma and other chronic lower respiratory diseases: Secondary | ICD-10-CM | POA: Insufficient documentation

## 2015-04-07 DIAGNOSIS — Z7902 Long term (current) use of antithrombotics/antiplatelets: Secondary | ICD-10-CM | POA: Insufficient documentation

## 2015-04-07 DIAGNOSIS — Z79899 Other long term (current) drug therapy: Secondary | ICD-10-CM | POA: Diagnosis not present

## 2015-04-07 DIAGNOSIS — K219 Gastro-esophageal reflux disease without esophagitis: Secondary | ICD-10-CM | POA: Insufficient documentation

## 2015-04-07 DIAGNOSIS — I10 Essential (primary) hypertension: Secondary | ICD-10-CM | POA: Diagnosis not present

## 2015-04-07 DIAGNOSIS — I480 Paroxysmal atrial fibrillation: Secondary | ICD-10-CM

## 2015-04-07 DIAGNOSIS — F329 Major depressive disorder, single episode, unspecified: Secondary | ICD-10-CM | POA: Diagnosis not present

## 2015-04-07 DIAGNOSIS — J45909 Unspecified asthma, uncomplicated: Secondary | ICD-10-CM | POA: Insufficient documentation

## 2015-04-07 NOTE — Progress Notes (Signed)
Patient ID: [name] Spence, MD, male   DOB: 1929-07-10, 82 y.o.   MRN: [id number]     Primary Care Physician: [name], MD Referring Physician: Dr. Roxan Diesel, MD is a 82 y.o. male with a h/o persistent atrial fibrillation on coumadin, CAD, hypertension, hyperlipidemia and asthma who presented on the day of admission with weakness and syncope. Patient went to bathroom and wife heard a thump shortly after. Pt found on the floor unresponsive. Pt quickly became arousable Wife noted slurred speech and left facial droop left arm weakness. EMS was activated, and code stroke was activated. Initial CT of the brain was negative for acute findings. However, MRI of the brain showed right parietal cortical hemorrhage, acute versus subacute. Neurology was consulted and full stroke workup was undertaken.   His symptoms were reversible without further treatment. However, it was felt that he may benefit from d/c of coumadin and transfer to apixaban when INR less than 2. He had afib the day before admission and had an INR of 1.9 on admission. His INR was still above 2.0 at discharge, so he did follow up in the coumadin clinic on Friday and INR  was 1.5, so eliquis was initiated.  In the afib cliinic 2/27, he has not had any further episodes of afib and is in SR, No further stroke symptoms and has had 3 days of eliquis.  Today, he denies symptoms of palpitations, chest pain, shortness of breath, orthopnea, PND, lower extremity edema, dizziness, presyncope, syncope, or neurologic sequela. The patient is tolerating medications without difficulties and is otherwise without complaint today.   Past Medical History  Diagnosis Date  . CAD (coronary artery disease)   . Sinus bradycardia   . Anxiety   . Asthma   . Atrial fibrillation ([place])   . GERD (gastroesophageal reflux disease) 12/30/2000  . Hyperlipidemia   . Hypertension   . Allergic rhinitis   . Diverticulosis 03/16/1995  . Hiatal hernia  12/30/2000  . History of duodenal ulcer 10/15/1986  . Heart attack ([place]) 1984  . History of blood transfusion 1988    "related to GI bleeding OR"  . DJD (degenerative joint disease)     "most joints" (03/11/2015)  . Depression    Past Surgical History  Procedure Laterality Date  . Nasal septum surgery  1972  . Hospital ccu  1984    heart attack, hypoplastic right coronary  . Total knee arthroplasty Bilateral 1998-2004    "right-left"  . Coronary angioplasty with stent placement  2010  . Vagotomy and pyloroplasty  1988    "bleeding duodenal ulcer"  . Tonsillectomy    . Cholecystectomy open  1989  . Cardiac catheterization    . Inguinal hernia repair Right ~ 1990  . Cataract extraction w/ intraocular lens  implant, bilateral Bilateral ?2013    Current Outpatient Prescriptions  Medication Sig Dispense Refill  . acetaminophen (TYLENOL) 325 MG tablet Take 2 tablets (650 mg total) by mouth every 4 (four) hours as needed for headache or mild pain.    Marland Kitchen apixaban (ELIQUIS) 5 MG TABS tablet Take 1 tablet (5 mg total) by mouth 2 (two) times daily. Start as per instructions by your Coumadin Clinic. To be started when INR is less than 2. 60 tablet 0  . atenolol (TENORMIN) 25 MG tablet Take 1 tablet (25 mg total) by mouth 2 (two) times daily.    Marland Kitchen atorvastatin (LIPITOR) 20 MG tablet Take 1 tablet (20 mg total) by  mouth daily. (Patient taking differently: Take 20 mg by mouth daily at 6 PM. ) 90 tablet 3  . buPROPion (WELLBUTRIN SR) 150 MG 12 hr tablet Take 1 tablet (150 mg total) by mouth daily. 100 tablet 4  . cetirizine (ZYRTEC) 10 MG tablet Take 10 mg by mouth daily.    . Cholecalciferol (VITAMIN D3) 400 UNITS CAPS Take 400 Units by mouth daily.     Marland Kitchen dofetilide (TIKOSYN) 250 MCG capsule Take 1 capsule (250 mcg total) by mouth 2 (two) times daily. 14 capsule 0  . fluticasone (FLONASE) 50 MCG/ACT nasal spray Place 2 sprays into both nostrils daily. 32 g 6  . fluticasone (FLOVENT HFA) 110 MCG/ACT  inhaler Inhale 1 puff into the lungs 2 (two) times daily. 3 Inhaler 12  . furosemide (LASIX) 20 MG tablet Take 20 mg by mouth every other day. 1 tablet my mouth every other day.    . montelukast (SINGULAIR) 10 MG tablet TAKE 1 TABLET BY MOUTH ONCE DAILY (Patient taking differently: Take 10 mg by mouth at bedtime. TAKE 1 TABLET BY MOUTH ONCE DAILY) 90 tablet 4  . nitroGLYCERIN (NITROSTAT) 0.4 MG SL tablet Place 1 tablet (0.4 mg total) under the tongue every 5 (five) minutes x 3 doses as needed for chest pain. 25 tablet 2  . Omega-3 Fatty Acids (FISH OIL) 1000 MG CAPS Take 1,000 mg by mouth daily.    Marland Kitchen omeprazole (PRILOSEC OTC) 20 MG tablet Take 1 tablet (20 mg total) by mouth daily. 100 tablet 4  . PROAIR HFA 108 (90 BASE) MCG/ACT inhaler INHALE 2 PUFFS INTO THE LUNGS EVERY 6 HOURS AS NEEDED FOR WHEEZING 8.5 g 0  . tamsulosin (FLOMAX) 0.4 MG CAPS capsule TAKE ONE CAPSULE BY MOUTH DAILY (Patient taking differently: Take 0.4 mg by mouth daily after supper. TAKE ONE CAPSULE BY MOUTH DAILY) 100 capsule 4   No current facility-administered medications for this encounter.    No Known Allergies  Social History   Social History  . Marital Status: Married    Spouse Name: N/A  . Number of Children: 2  . Years of Education: N/A   Occupational History  . DOCTOR    Social History Main Topics  . Smoking status: Never Smoker   . Smokeless tobacco: Never Used  . Alcohol Use: No  . Drug Use: No  . Sexual Activity: Not Currently   Other Topics Concern  . Not on file   Social History Narrative   Married. Regular exercise - yes.     Family History  Problem Relation Age of Onset  . Coronary artery disease Mother     deceased  . Deep vein thrombosis Sister   . Coronary artery disease Brother   . Asthma Brother   . Colon cancer Neg Hx     ROS- All systems are reviewed and negative except as per the HPI above  Physical Exam: Filed Vitals:   04/07/15 0950  BP: 118/62  Pulse: 52  Height:  5\' 10"  (1.778 m)  Weight: 165 lb (74.844 kg)    GEN- The patient is well appearing, alert and oriented x 3 today.   Head- normocephalic, atraumatic Eyes-  Sclera clear, conjunctiva pink Ears- hearing intact Oropharynx- clear Neck- supple, no JVP Lymph- no cervical lymphadenopathy Lungs- Clear to ausculation bilaterally, normal work of breathing Heart- Regular rate and rhythm, no murmurs, rubs or gallops, PMI not laterally displaced GI- soft, NT, ND, + BS Extremities- no clubbing, cyanosis, or edema MS- no  significant deformity or atrophy Skin- no rash or lesion Psych- euthymic mood, full affect Neuro- strength and sensation are intact  EKG- sinus brady at 52 bpm, pr int 168 ms, qrs int 82 ms, qtc 480ms  Epic records reviewed K+ 4.5, mag 2.0 , creat 1.38 2/22   Assessment and Plan: 1. afib For most part staying in SR After seeing Dr. Rayann Heman, 2/12, he felt if he should fail tikosyn, then amiodarone would be next option  2. Hemorrhagic rt pareital infarct secondary to known  afib Symptoms resolved Chadsvasc score of 5 Now off coumadin and is on eliquis 5 mg bid  3. HTN Stable  F/u with Dr. Aundra Dubin 3/6  Will see back to f/u tikosyn in 3 months  Butch Penny C. Ivon Oelkers, [facility] [address] [place], [place] [address] [phone]

## 2015-04-14 ENCOUNTER — Ambulatory Visit (INDEPENDENT_AMBULATORY_CARE_PROVIDER_SITE_OTHER): Payer: Medicare Other | Admitting: Cardiology

## 2015-04-14 ENCOUNTER — Encounter: Payer: Self-pay | Admitting: Cardiology

## 2015-04-14 VITALS — BP 110/60 | HR 60 | Ht 70.5 in | Wt 164.5 lb

## 2015-04-14 DIAGNOSIS — I2510 Atherosclerotic heart disease of native coronary artery without angina pectoris: Secondary | ICD-10-CM

## 2015-04-14 DIAGNOSIS — Z9861 Coronary angioplasty status: Secondary | ICD-10-CM

## 2015-04-14 DIAGNOSIS — I480 Paroxysmal atrial fibrillation: Secondary | ICD-10-CM | POA: Diagnosis not present

## 2015-04-14 NOTE — Progress Notes (Signed)
Patient ID: [name] Spence, MD, male   DOB: 1929/10/05, 82 y.o.   MRN: [id number] PCP: Dr. 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  He had an RV infarct with chronic total occlusion of a small nondominant RCA in the 1980s.  He had a drug-eluting stent to the CFX in 11/10.    He is very symptomatic with atrial fibrillation episodes. He has had a chronic pattern of mild shortness of breath and mild left shoulder pain/chest aching when walking up a hill (x 20 years).   He can walk 3 miles on flat ground without problems.  He golfs 3-4 times a week.  He walks for 20-30 minutes on a treadmill several times a week.   Echo in 7/16 showed normal LV systolic function, EF [id number] with moderate diastolic dysfunction.  Moderate TR with PA systolic pressure 65 mmHg.  Normal RV size and systolic function.  Given volume overload on exam and abnormal echo, he was started on low dose Lasix in the past.    He was started on Multaq but continued to have episodes of atrial fibrillation.  This was stopped, and he was admitted for Tikosyn loading.  After he went home, he developed left facial droop and left arm weakness.  He was found to have a small right parietal cortical infarct with hemorrhagic conversion.  His symptoms resolved quickly. INR was subtherapeutic at admission.  Warfarin was stopped and he was put on apixaban.   He is now doing well.  Seems to be staying out of atrial fibrillation, no palpitations.  He remains short of breath walking up a hill.  Playing golf again.  No BRBPR or melena.  Fatigues more easily than in the past but has not been as active.   Labs (6/11): LDL 63, HDL 35, K 5.5, creatinine 1.1 Labs (6/12): LDL 58, HDL 53, TSH normal, K 4.7, creatinine 1 Labs (12/12): BNP 79 Labs (7/14): K 5, creatinine 1.1, LDL 48, HDL 34 Labs (8/15): LDL 48, HDL 33, TSH normal, K 5.4, creatinine 1.2 Labs (6/16): K 5.1,  creatinine 1.27, HCT 42.5 Labs (10/16): LDL 37, HDL 32 Labs (11/16): K 4.4, creatinine 1.36, BNP 103 Labs  (2/17): K 4.5, creatinine 1.38, Mg 2.0, LDL 32  ECG (2/17): NSR, inferior and inferolateral TWIs, QTc ok.   PMH: 1. CAD: RV infarct in the 1980s.  Chronic total occlusion of a small nondominant RCA.  11/10 PCI to CFX with Xience DES.  EF 60% on LV-gram at that time. ETT (11/14) with 9'07" exercise, no significant ST depression.  2.  Paroxysmal atrial fibrillation: Multaq was ineffective. Now on Tikosyn.  3.  Hyperlipidemia 4. GERD 5. HTN 6. Asthma 7. Chronic diastolic CHF: Echo ([id number]): EF 55-60%, mild LV hypertrophy, no regional WMAs, mild AI, mild MR, mild-moderate biatrial enlargement, PA systolic pressure 51 mmHg. Echo (7/16) with EF 60-65%, moderate diastolic dysfunction, normal RV size and systolic function, mild-moderate MR, moderate TR, moderate biatrial enlargement, PA systolic pressure 65 mmHg.  8. CKD 9. CVA: Right parietal cortical CVA with peri-infarct hemorrhagic conversion.  CTA showed right M2 branch flow gap.    SH: Nonsmoker.  Retired Stage manager, lives in [place].  2 daughters.  Originally from [place].   FH: Brother with CABG.   ROS: All systems reviewed and negative except as per HPI.    Current Outpatient Prescriptions  Medication Sig Dispense Refill  . acetaminophen (TYLENOL) 325  MG tablet Take 2 tablets (650 mg total) by mouth every 4 (four) hours as needed for headache or mild pain.    Marland Kitchen apixaban (ELIQUIS) 5 MG TABS tablet Take 1 tablet (5 mg total) by mouth 2 (two) times daily. Start as per instructions by your Coumad[facility] Clinic. To be started when INR is less than 2. 60 tablet 0  . atenolol (TENORMIN) 25 MG tablet Take 1 tablet (25 mg total) by mouth 2 (two) times daily.    Marland Kitchen atorvastatin (LIPITOR) 20 MG tablet Take 1 tablet (20 mg total) by mouth daily. (Patient taking differently: Take 20 mg by mouth daily at 6 PM. ) 90 tablet 3  . buPROPion  (WELLBUTRIN SR) 150 MG 12 hr tablet Take 1 tablet (150 mg total) by mouth daily. 100 tablet 4  . cetirizine (ZYRTEC) 10 MG tablet Take 10 mg by mouth daily.    . Cholecalciferol (VITAMIN D3) 400 UNITS CAPS Take 400 Units by mouth daily.     Marland Kitchen dofetilide (TIKOSYN) 250 MCG capsule Take 1 capsule (250 mcg total) by mouth 2 (two) times daily. 14 capsule 0  . fluticasone (FLONASE) 50 MCG/ACT nasal spray Place 2 sprays into both nostrils daily. 32 g 6  . fluticasone (FLOVENT HFA) 110 MCG/ACT inhaler Inhale 1 puff into the lungs 2 (two) times daily. 3 Inhaler 12  . furosemide (LASIX) 20 MG tablet Take 20 mg by mouth every other day. 1 tablet my mouth every other day.    . montelukast (SINGULAIR) 10 MG tablet TAKE 1 TABLET BY MOUTH ONCE DAILY (Patient taking differently: Take 10 mg by mouth at bedtime. TAKE 1 TABLET BY MOUTH ONCE DAILY) 90 tablet 4  . nitroGLYCERIN (NITROSTAT) 0.4 MG SL tablet Place 1 tablet (0.4 mg total) under the tongue every 5 (five) minutes x 3 doses as needed for chest pain. 25 tablet 2  . Omega-3 Fatty Acids (FISH OIL) 1000 MG CAPS Take 1,000 mg by mouth daily.    Marland Kitchen omeprazole (PRILOSEC OTC) 20 MG tablet Take 1 tablet (20 mg total) by mouth daily. 100 tablet 4  . PROAIR HFA 108 (90 BASE) MCG/ACT inhaler INHALE 2 PUFFS INTO THE LUNGS EVERY 6 HOURS AS NEEDED FOR WHEEZING 8.5 g 0  . tamsulosin (FLOMAX) 0.4 MG CAPS capsule TAKE ONE CAPSULE BY MOUTH DAILY (Patient taking differently: Take 0.4 mg by mouth daily after supper. TAKE ONE CAPSULE BY MOUTH DAILY) 100 capsule 4   No current facility-administered medications for this visit.    BP 110/60 mmHg  Pulse 60  Ht 5' 10.5" (1.791 m)  Wt 164 lb 8 oz (74.617 kg)  BMI 23.26 kg/m2 General: NAD Neck: JVP 7 cm, no thyromegaly or thyroid nodule.  Lungs: Clear to auscultation bilaterally with normal respiratory effort. CV: Nondisplaced PMI.  Heart regular S1/S2, soft S4, no murmur.  1+ ankle edema.  No carotid bruit.  Normal pedal pulses.   Abdomen: Soft, nontender, no hepatosplenomegaly, no distention.  Neurologic: Alert and oriented x 3.  Psych: Normal affect. Extremities: No clubbing or cyanosis.   Assessment/Plan: 1. 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  This has really not changed  He is still able to golf, walk on a treadmill, etc with no problem.  ETT in 11/14 was a normal study.  - If he has any change in his exertional symptoms, he will let me know and I will arrange for ETT-Cardiolite.   -  He is on apixaban without ASA given stable CAD.   - Continue statin, beta blocker.  2. Hyperlipidemia: Good lipids in 2/17.    3. Chronic diastolic CHF: Echo in [id number] suggested progressive diastolic dysfunction.  EF 60-65% with grade II diastolic dysfunction and PA systolic pressure up to 65 mmHg.  RV appeared normal. He looks euvolemic today on Lasix 20 mg every other day.  Recent BMET ok. 4. Atrial fibrillation: Paroxysmal.  Episodes are symptomatic, he feels "bad" with atrial fibrillation even when he's rate-controlled. He had frequent breakthrough on Multaq but seems to be doing much better on Tikosyn. - Continue Tikosyn.  Recent labs and ECG ok. - If has breakthrough afib on Tikosyn, amiodarone would be an option.  - Now on apixaban with CVA on warfarin (was subtherapeutic).   5. HTN: BP controlled.  6. CKD: He is no longer using NSAIDs.  BMET stable recently.  Followup in 3 months.     Loralie Champagne 04/14/2015

## 2015-04-14 NOTE — Patient Instructions (Signed)
Your physician recommends that you schedule a follow-up appointment in: 3 months with Dr. Aundra Dubin.  Your physician recommends that you continue on your current medications as directed. Please refer to the Current Medication list given to you today.

## 2015-04-21 ENCOUNTER — Ambulatory Visit (INDEPENDENT_AMBULATORY_CARE_PROVIDER_SITE_OTHER): Payer: Medicare Other | Admitting: *Deleted

## 2015-04-21 DIAGNOSIS — Z7901 Long term (current) use of anticoagulants: Secondary | ICD-10-CM | POA: Diagnosis not present

## 2015-04-21 DIAGNOSIS — I4891 Unspecified atrial fibrillation: Secondary | ICD-10-CM | POA: Diagnosis not present

## 2015-04-21 LAB — CBC
HEMATOCRIT: 41.7 % (ref 39.0–52.0)
HEMOGLOBIN: 13.9 g/dL (ref 13.0–17.0)
MCH: 30.5 pg (ref 26.0–34.0)
MCHC: 33.3 g/dL (ref 30.0–36.0)
MCV: 91.4 fL (ref 78.0–100.0)
MPV: 10.5 fL (ref 8.6–12.4)
PLATELETS: 245 10*3/uL (ref 150–400)
RBC: 4.56 MIL/uL (ref 4.22–5.81)
RDW: 13.5 % (ref 11.5–15.5)
WBC: 8.3 10*3/uL (ref 4.0–10.5)

## 2015-04-21 LAB — BASIC METABOLIC PANEL
BUN: 24 mg/dL (ref 7–25)
CHLORIDE: 103 mmol/L (ref 98–110)
CO2: 24 mmol/L (ref 20–31)
Calcium: 8.9 mg/dL (ref 8.6–10.3)
Creat: 1.18 mg/dL — ABNORMAL HIGH (ref 0.70–1.11)
GLUCOSE: 166 mg/dL — AB (ref 65–99)
POTASSIUM: 4.1 mmol/L (ref 3.5–5.3)
Sodium: 136 mmol/L (ref 135–146)

## 2015-04-21 NOTE — Progress Notes (Signed)
Pt was started on Eliquis 5mg  twice a day for afib on 04/03/15 by Dr. Aundra Dubin.    Reviewed patients medication list.  Pt is not currently on any combined P-gp and strong CYP3A4 inhibitors/inducers (ketoconazole, traconazole, ritonavir, carbamazepine, phenytoin, rifampin, St. John's wort).  Reviewed labs: SCr-1.18, Hgb-13.9, HCT-41.7, and Weight-74.8kg.  Dose appropriate based on dosing criteria.  Hgb and HCT within normal limits.    A full discussion of the nature of anticoagulants has been carried out.  A benefit/risk analysis has been presented to the patient, so that they understand the justification for choosing anticoagulation with Eliquis at this time.  The need for compliance is stressed.  Pt is aware to take the medication twice daily.  Side effects of potential bleeding are discussed, including unusual colored urine or stools, coughing up blood or coffee ground emesis, nose bleeds or serious fall or head trauma.  Discussed signs and symptoms of stroke. The patient should avoid any OTC items containing aspirin or ibuprofen.  Avoid alcohol consumption.   Call if any signs of abnormal bleeding.  Discussed financial obligations and resolved any difficulty in obtaining medication.  Patient states he has not had to purchase any of the medication at this time but will let us know if the cost is  affordable or not.  Next lab test test in 6 months.   Patient taken to the lab for CBC & BMET.  Spoke with patient and instructed to continue taking Eliquis 5mg  twice a day.  Also, instructed patient to call with any issues and to report to ER with any bleeding & he verbalized understanding.

## 2015-04-22 ENCOUNTER — Other Ambulatory Visit (HOSPITAL_COMMUNITY): Payer: Self-pay | Admitting: *Deleted

## 2015-04-22 MED ORDER — APIXABAN 5 MG PO TABS: 5.0000 mg | ORAL_TABLET | Freq: Two times a day (BID) | ORAL | Status: DC

## 2015-04-29 ENCOUNTER — Telehealth (HOSPITAL_COMMUNITY): Payer: Self-pay | Admitting: *Deleted

## 2015-04-29 NOTE — Telephone Encounter (Signed)
[place] for Eliquis PA [id number] approved through 02/08/16

## 2015-05-05 DIAGNOSIS — H43813 Vitreous degeneration, bilateral: Secondary | ICD-10-CM | POA: Diagnosis not present

## 2015-05-05 DIAGNOSIS — H26491 Other secondary cataract, right eye: Secondary | ICD-10-CM | POA: Diagnosis not present

## 2015-05-05 DIAGNOSIS — Z0100 Encounter for examination of eyes and vision without abnormal findings: Secondary | ICD-10-CM | POA: Diagnosis not present

## 2015-05-05 DIAGNOSIS — H31001 Unspecified chorioretinal scars, right eye: Secondary | ICD-10-CM | POA: Diagnosis not present

## 2015-05-23 ENCOUNTER — Ambulatory Visit (INDEPENDENT_AMBULATORY_CARE_PROVIDER_SITE_OTHER): Payer: Medicare Other | Admitting: Neurology

## 2015-05-23 ENCOUNTER — Encounter: Payer: Self-pay | Admitting: Neurology

## 2015-05-23 VITALS — BP 133/67 | HR 56 | Ht 70.5 in | Wt 164.8 lb

## 2015-05-23 DIAGNOSIS — I63131 Cerebral infarction due to embolism of right carotid artery: Secondary | ICD-10-CM | POA: Diagnosis not present

## 2015-05-23 NOTE — Patient Instructions (Signed)
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 I also advised the patient to eat a healthy diet with plenty of whole grains, cereals, fruits and vegetables, exercise regularly and maintain ideal body weight Followup in the future with me in 6 months or cal;l earlier if necessary  Stroke Prevention Some medical conditions and behaviors are associated with an increased chance of having a stroke. You may prevent a stroke by making healthy choices and managing medical conditions. HOW CAN I REDUCE MY RISK OF HAVING A STROKE?   Stay physically active. Get at least 30 minutes of activity on most or all days.  Do not smoke. It may also be helpful to avoid exposure to secondhand smoke.  Limit alcohol use. Moderate alcohol use is considered to be:  No more than 2 drinks per day for men.  No more than 1 drink per day for nonpregnant women.  Eat healthy foods. This involves:  Eating 5 or more servings of fruits and vegetables a day.  Making dietary changes that address high blood pressure (hypertension), high cholesterol, diabetes, or obesity.  Manage your cholesterol levels.  Making food choices that are high in fiber and low in saturated fat, trans fat, and cholesterol may control cholesterol levels.  Take any prescribed medicines to control cholesterol as directed by your health care provider.  Manage your diabetes.  Controlling your carbohydrate and sugar intake is recommended to manage diabetes.  Take any prescribed medicines to control diabetes as directed by your health care provider.  Control your  hypertension.  Making food choices that are low in salt (sodium), saturated fat, trans fat, and cholesterol is recommended to manage hypertension.  Ask your health care provider if you need treatment to lower your blood pressure. Take any prescribed medicines to control hypertension as directed by your health care provider.  If you are 48-57 years of age, have your blood pressure checked every 3-5 years. If you are 73 years of age or older, have your blood pressure checked every year.  Maintain a healthy weight.  Reducing calorie intake and making food choices that are low in sodium, saturated fat, trans fat, and cholesterol are recommended to manage weight.  Stop drug abuse.  Avoid taking birth control pills.  Talk to your health care provider about the risks of taking birth control pills if you are over 73 years old, smoke, get migraines, or have ever had a blood clot.  Get evaluated for sleep disorders (sleep apnea).  Talk to your health care provider about getting a sleep evaluation if you snore a lot or have excessive sleepiness.  Take medicines only as directed by your health care provider.  For some people, aspirin or blood thinners (anticoagulants) are helpful in reducing the risk of forming abnormal blood clots that can lead to stroke. If you have the irregular heart rhythm of atrial fibrillation, you should be on a blood thinner unless there is a good reason you cannot take them.  Understand all your medicine instructions.  Make sure that other conditions (such as anemia or atherosclerosis) are addressed. SEEK IMMEDIATE MEDICAL CARE IF:   You have sudden weakness or numbness of the face, arm,  or leg, especially on one side of the body.  Your face or eyelid droops to one side.  You have sudden confusion.  You have trouble speaking (aphasia) or understanding.  You have sudden trouble seeing in one or both eyes.  You have sudden trouble walking.  You have  dizziness.  You have a loss of balance or coordination.  You have a sudden, severe headache with no known cause.  You have new chest pain or an irregular heartbeat. Any of these symptoms may represent a serious problem that is an emergency. Do not wait to see if the symptoms will go away. Get medical help at once. Call your local emergency services (911 in [place] Do not drive yourself to the hospital.   This information is not intended to replace advice given to you by your health care provider. Make sure you discuss any questions you have with your health care provider.   Document Released: 03/04/2004 Document Revised: 02/15/2014 Document Reviewed: 07/28/2012 Elsevier Interactive Patient Education [employer].

## 2015-05-23 NOTE — Progress Notes (Signed)
[facility] [address] [place]. [place] [address] [phone]       OFFICE FOLLOW-UP NOTE  [name], MD Date of Birth:  20-Sep-1929 Medical Record Number:  [id number]   HPI: 82 year old Caucasian male seen today for first office follow-up visit following [name] hospital admission in February 2017 for TIA. 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 He has no previous history of stroke nor TIA. INR was 1.9. CT scan of his head showed no acute intracranial abnormality. CT angiogram of head and neck showed a right M2 branch flow gap of 3 mm with preserved flow distally. Nondominant vertebral artery showed moderate to severe stenosis at the origin and in the distal left V4 segment. Patient's deficits cleared fairly rapidly. NIH stroke score was 4 at the time of his initial evaluation. Patient subsequently became clear all deficits with no intervention. He was LSN 6:15 PM on 03/31/2015. Patient was not administered TPA secondary to Rapid resolution of deficits; INR of 1.9. He was admitted for further evaluation and treatment. CT head on admission showed an old lacunar infarct in left basal ganglia and mild age-related cortical atrophy. There is evidence of severe pansinusitis. CT angiogram was negative for any large vessel occlusion but did show a right M2 middle cerebral artery branch occlusion. MRI scan of the brain showed small hemorrhagic infarct in the right parietal region which is consistent with the area of the right M2 branch occlusion. Patient remained stable during the hospital stay. LDL cholesterol 72. Hemoglobin A1c was 6. Transthoracic  echo was not repeated as he had previous one in July 2016 which was normal. Patient was changed from warfarin to eliquis for anticoagulation and the time of discharge. States his done well since discharge. He is had no recurrent symptoms. He is tolerating eliquis well without bruising or bleeding. He has seen his cardiologist Dr. Aundra Dubin follow-up. He states his blood pressure is well controlled and today it is 1-3/67 in office. He is tolerating Tikosyn and has stayed in sinus rhythm.  ROS:   14 system review of systems is positive for  no complaints today  PMH:  Past Medical History  Diagnosis Date  . CAD (coronary artery disease)   . Sinus bradycardia   . Anxiety   . Asthma   . Atrial fibrillation ([place])   . GERD (gastroesophageal reflux disease) 12/30/2000  . Hyperlipidemia   . Hypertension   . Allergic rhinitis   . Diverticulosis 03/16/1995  . Hiatal hernia 12/30/2000  . History of duodenal ulcer 10/15/1986  . Heart attack ([place]) 1984  . History of blood transfusion 1988    "related to GI bleeding OR"  . DJD (degenerative joint disease)     "most joints" (03/11/2015)  . Depression   . Stroke [facility])     Social History:  Social History   Social History  . Marital Status: Married    Spouse Name: N/A  . Number of Children: 2  . Years of Education: N/A   Occupational History  . DOCTOR    Social History Main Topics  . Smoking status: Never Smoker   . Smokeless tobacco: Never Used  . Alcohol Use: No  .  Drug Use: No  . Sexual Activity: Not Currently   Other Topics Concern  . Not on file   Social History Narrative   Married. Regular exercise - yes.     Medications:   Current Outpatient Prescriptions on File Prior to Visit  Medication Sig Dispense Refill  . apixaban (ELIQUIS) 5 MG TABS tablet Take 1 tablet (5 mg total) by mouth 2 (two) times daily. 60 tablet 6  . atenolol (TENORMIN) 25 MG tablet Take 1 tablet (25 mg total) by mouth 2 (two) times daily.    Marland Kitchen atorvastatin  (LIPITOR) 20 MG tablet Take 1 tablet (20 mg total) by mouth daily. (Patient taking differently: Take 20 mg by mouth daily at 6 PM. ) 90 tablet 3  . buPROPion (WELLBUTRIN SR) 150 MG 12 hr tablet Take 1 tablet (150 mg total) by mouth daily. 100 tablet 4  . cetirizine (ZYRTEC) 10 MG tablet Take 10 mg by mouth daily.    . Cholecalciferol (VITAMIN D3) 400 UNITS CAPS Take 400 Units by mouth daily.     Marland Kitchen dofetilide (TIKOSYN) 250 MCG capsule Take 1 capsule (250 mcg total) by mouth 2 (two) times daily. 14 capsule 0  . fluticasone (FLONASE) 50 MCG/ACT nasal spray Place 2 sprays into both nostrils daily. 32 g 6  . fluticasone (FLOVENT HFA) 110 MCG/ACT inhaler Inhale 1 puff into the lungs 2 (two) times daily. 3 Inhaler 12  . furosemide (LASIX) 20 MG tablet Take 20 mg by mouth every other day. 1 tablet my mouth every other day.    . montelukast (SINGULAIR) 10 MG tablet TAKE 1 TABLET BY MOUTH ONCE DAILY (Patient taking differently: Take 10 mg by mouth at bedtime. TAKE 1 TABLET BY MOUTH ONCE DAILY) 90 tablet 4  . nitroGLYCERIN (NITROSTAT) 0.4 MG SL tablet Place 1 tablet (0.4 mg total) under the tongue every 5 (five) minutes x 3 doses as needed for chest pain. 25 tablet 2  . Omega-3 Fatty Acids (FISH OIL) 1000 MG CAPS Take 1,000 mg by mouth daily.    Marland Kitchen omeprazole (PRILOSEC OTC) 20 MG tablet Take 1 tablet (20 mg total) by mouth daily. 100 tablet 4  . PROAIR HFA 108 (90 BASE) MCG/ACT inhaler INHALE 2 PUFFS INTO THE LUNGS EVERY 6 HOURS AS NEEDED FOR WHEEZING 8.5 g 0  . tamsulosin (FLOMAX) 0.4 MG CAPS capsule TAKE ONE CAPSULE BY MOUTH DAILY (Patient taking differently: Take 0.4 mg by mouth daily after supper. TAKE ONE CAPSULE BY MOUTH DAILY) 100 capsule 4   No current facility-administered medications on file prior to visit.    Allergies:  No Known Allergies  Physical Exam General: well developed, well nourished elderly Caucasian male, seated, in no evident distress Head: head normocephalic and atraumatic.  Neck:  supple with no carotid or supraclavicular bruits Cardiovascular: regular rate and rhythm, no murmurs Musculoskeletal: no deformity Skin:  no rash/petichiae Vascular:  Normal pulses all extremities Filed Vitals:   05/23/15 0900  BP: 133/67  Pulse: 56   Neurologic Exam Mental Status: Awake and fully alert. Oriented to place and time. Recent and remote memory intact. Attention span, concentration and fund of knowledge appropriate. Mood and affect appropriate.  Cranial Nerves: Fundoscopic exam reveals sharp disc margins. Pupils equal, briskly reactive to light. Extraocular movements full without nystagmus. Visual fields full to confrontation. Hearing intact. Facial sensation intact. Face, tongue, palate moves normally and symmetrically.  Motor: Normal bulk and tone. Normal strength in all tested extremity muscles. Sensory.: intact to touch ,pinprick .position  and vibratory sensation.  Coordination: Rapid alternating movements normal in all extremities. Finger-to-nose and heel-to-shin performed accurately bilaterally. Gait and Station: Arises from chair without difficulty. Stance is normal. Gait demonstrates normal stride length and balance . Able to heel, toe and tandem walk without difficulty.  Reflexes: 1+ and symmetric. Toes downgoing.   NIHSS  0 Modified Rankin  0  ASSESSMENT: 82 year old Caucasian male with embolic small right parietal hemorrhagic infarct in February 2017 second to atrial fibrillation with slightly suboptimal anticoagulation on warfarin (INR 1.9)    PLAN: I had a long d/w Dr Caryn Section about his recent TIA, atrial fibrillation, risk for recurrent stroke/TIAs, personally independently reviewed imaging studies and stroke evaluation results and answered questions.Continue Eliquis  for secondary stroke prevention and maintain strict control of hypertension with blood pressure goal below 130/90, diabetes with hemoglobin A1c goal below 6.5% and lipids with LDL cholesterol goal  below 70 mg/dL. I also advised the patient to eat a healthy diet with plenty of whole grains, cereals, fruits and vegetables, exercise regularly and maintain ideal body weight Greater than 50% of time during this 25 minute visit was spent on counseling,explanation of diagnosis, planning of further management, discussion with patient and family and coordination of care .Followup in the future with me in 6 months or call earlier if necessary Antony Contras, MD   Note: This document was prepared with digital dictation and possible smart phrase technology. Any transcriptional errors that result from this process are unintentional

## 2015-06-16 ENCOUNTER — Other Ambulatory Visit: Payer: Self-pay | Admitting: Family Medicine

## 2015-06-25 ENCOUNTER — Other Ambulatory Visit: Payer: Self-pay | Admitting: Family Medicine

## 2015-07-02 ENCOUNTER — Encounter (HOSPITAL_COMMUNITY): Payer: Self-pay | Admitting: Nurse Practitioner

## 2015-07-02 ENCOUNTER — Ambulatory Visit (HOSPITAL_COMMUNITY)
Admission: RE | Admit: 2015-07-02 | Discharge: 2015-07-02 | Disposition: A | Discharge status: Home / Self Care | Payer: Medicare Other | Source: Ambulatory Visit | Attending: Nurse Practitioner | Admitting: Nurse Practitioner

## 2015-07-02 VITALS — BP 122/60 | HR 62 | Ht 70.5 in | Wt 161.8 lb

## 2015-07-02 DIAGNOSIS — K449 Diaphragmatic hernia without obstruction or gangrene: Secondary | ICD-10-CM | POA: Diagnosis not present

## 2015-07-02 DIAGNOSIS — F419 Anxiety disorder, unspecified: Secondary | ICD-10-CM | POA: Insufficient documentation

## 2015-07-02 DIAGNOSIS — K219 Gastro-esophageal reflux disease without esophagitis: Secondary | ICD-10-CM | POA: Diagnosis not present

## 2015-07-02 DIAGNOSIS — Z7951 Long term (current) use of inhaled steroids: Secondary | ICD-10-CM | POA: Diagnosis not present

## 2015-07-02 DIAGNOSIS — I2510 Atherosclerotic heart disease of native coronary artery without angina pectoris: Secondary | ICD-10-CM | POA: Insufficient documentation

## 2015-07-02 DIAGNOSIS — Z800 Family history of malignant neoplasm of digestive organs: Secondary | ICD-10-CM | POA: Diagnosis not present

## 2015-07-02 DIAGNOSIS — I10 Essential (primary) hypertension: Secondary | ICD-10-CM | POA: Insufficient documentation

## 2015-07-02 DIAGNOSIS — Z9889 Other specified postprocedural states: Secondary | ICD-10-CM | POA: Insufficient documentation

## 2015-07-02 DIAGNOSIS — Z825 Family history of asthma and other chronic lower respiratory diseases: Secondary | ICD-10-CM | POA: Diagnosis not present

## 2015-07-02 DIAGNOSIS — J45909 Unspecified asthma, uncomplicated: Secondary | ICD-10-CM | POA: Diagnosis not present

## 2015-07-02 DIAGNOSIS — Z8489 Family history of other specified conditions: Secondary | ICD-10-CM | POA: Insufficient documentation

## 2015-07-02 DIAGNOSIS — Z7901 Long term (current) use of anticoagulants: Secondary | ICD-10-CM | POA: Insufficient documentation

## 2015-07-02 DIAGNOSIS — I252 Old myocardial infarction: Secondary | ICD-10-CM | POA: Insufficient documentation

## 2015-07-02 DIAGNOSIS — I480 Paroxysmal atrial fibrillation: Secondary | ICD-10-CM | POA: Diagnosis not present

## 2015-07-02 DIAGNOSIS — I4891 Unspecified atrial fibrillation: Secondary | ICD-10-CM | POA: Diagnosis present

## 2015-07-02 DIAGNOSIS — Z8673 Personal history of transient ischemic attack (TIA), and cerebral infarction without residual deficits: Secondary | ICD-10-CM | POA: Diagnosis not present

## 2015-07-02 DIAGNOSIS — R9431 Abnormal electrocardiogram [ECG] [EKG]: Secondary | ICD-10-CM | POA: Diagnosis not present

## 2015-07-02 DIAGNOSIS — F329 Major depressive disorder, single episode, unspecified: Secondary | ICD-10-CM | POA: Insufficient documentation

## 2015-07-02 DIAGNOSIS — Z79899 Other long term (current) drug therapy: Secondary | ICD-10-CM | POA: Diagnosis not present

## 2015-07-02 DIAGNOSIS — E785 Hyperlipidemia, unspecified: Secondary | ICD-10-CM | POA: Diagnosis not present

## 2015-07-02 LAB — BASIC METABOLIC PANEL
Anion gap: 5 (ref 5–15)
BUN: 22 mg/dL — ABNORMAL HIGH (ref 6–20)
CO2: 26 mmol/L (ref 22–32)
Calcium: 8.9 mg/dL (ref 8.9–10.3)
Chloride: 106 mmol/L (ref 101–111)
Creatinine, Ser: 1.17 mg/dL (ref 0.61–1.24)
GFR calc Af Amer: >60 mL/min (ref >60)
GFR calc non Af Amer: 55 mL/min — ABNORMAL LOW (ref >60)
Glucose, Bld: 146 mg/dL — ABNORMAL HIGH (ref 65–99)
Potassium: 4.2 mmol/L (ref 3.5–5.1)
Sodium: 137 mmol/L (ref 135–145)

## 2015-07-02 LAB — MAGNESIUM: Magnesium: 2.1 mg/dL (ref 1.7–2.4)

## 2015-07-02 MED ORDER — ATENOLOL 25 MG PO TABS: 25.0000 mg | ORAL_TABLET | Freq: Two times a day (BID) | ORAL | Status: DC

## 2015-07-02 NOTE — Progress Notes (Signed)
Patient ID: [name] Spence, MD, male   DOB: 26-May-1929, 82 y.o.   MRN: [id number]     Primary Care Physician: [name], MD Referring Physician: Dr. Robyn Haber, MD is a 82 y.o. male with a h/o PAF on Tikosyn for f/u in the afib clinic. He has only noticed two breakthrough epiosodes, both have been on Sunday afternoon's. Usually will resolve with next dose of tikosyn/metoprolol. Has been staying active playing golf and has not had any issues with activity bringing on afib. He has not had any further symptoms of stroke and is doing well on eliquis without bleeding issues. QTc stable today.  Today, he denies symptoms of palpitations, chest pain, shortness of breath, orthopnea, PND, lower extremity edema, dizziness, presyncope, syncope, or neurologic sequela. The patient is tolerating medications without difficulties and is otherwise without complaint today.   Past Medical History  Diagnosis Date  . CAD (coronary artery disease)   . Sinus bradycardia   . Anxiety   . Asthma   . Atrial fibrillation ([place])   . GERD (gastroesophageal reflux disease) 12/30/2000  . Hyperlipidemia   . Hypertension   . Allergic rhinitis   . Diverticulosis 03/16/1995  . Hiatal hernia 12/30/2000  . History of duodenal ulcer 10/15/1986  . Heart attack ([place]) 1984  . History of blood transfusion 1988    "related to GI bleeding OR"  . DJD (degenerative joint disease)     "most joints" (03/11/2015)  . Depression   . Stroke [facility])    Past Surgical History  Procedure Laterality Date  . Nasal septum surgery  1972  . Hospital ccu  1984    heart attack, hypoplastic right coronary  . Total knee arthroplasty Bilateral 1998-2004    "right-left"  . Coronary angioplasty with stent placement  2010  . Vagotomy and pyloroplasty  1988    "bleeding duodenal ulcer"  . Tonsillectomy    . Cholecystectomy open  1989  . Cardiac catheterization    . Inguinal hernia repair Right ~ 1990  . Cataract extraction w/  intraocular lens  implant, bilateral Bilateral ?2013    Current Outpatient Prescriptions  Medication Sig Dispense Refill  . apixaban (ELIQUIS) 5 MG TABS tablet Take 1 tablet (5 mg total) by mouth 2 (two) times daily. 60 tablet 6  . atenolol (TENORMIN) 25 MG tablet Take 1 tablet (25 mg total) by mouth 2 (two) times daily. 180 tablet 3  . atorvastatin (LIPITOR) 20 MG tablet Take 1 tablet (20 mg total) by mouth daily. (Patient taking differently: Take 20 mg by mouth daily at 6 PM. ) 90 tablet 3  . buPROPion (WELLBUTRIN SR) 150 MG 12 hr tablet Take 1 tablet (150 mg total) by mouth daily. 100 tablet 4  . cetirizine (ZYRTEC) 10 MG tablet Take 10 mg by mouth daily.    . Cholecalciferol (VITAMIN D3) 400 UNITS CAPS Take 400 Units by mouth daily.     Marland Kitchen dofetilide (TIKOSYN) 250 MCG capsule Take 1 capsule (250 mcg total) by mouth 2 (two) times daily. 14 capsule 0  . fluticasone (FLONASE) 50 MCG/ACT nasal spray Place 2 sprays into both nostrils daily. 32 g 6  . fluticasone (FLOVENT HFA) 110 MCG/ACT inhaler Inhale 1 puff into the lungs 2 (two) times daily. 3 Inhaler 12  . furosemide (LASIX) 20 MG tablet Take 20 mg by mouth every other day. 1 tablet my mouth every other day.    . montelukast (SINGULAIR) 10 MG tablet TAKE 1 TABLET  BY MOUTH ONCE DAILY (Patient taking differently: Take 10 mg by mouth at bedtime. TAKE 1 TABLET BY MOUTH ONCE DAILY) 90 tablet 4  . nitroGLYCERIN (NITROSTAT) 0.4 MG SL tablet Place 1 tablet (0.4 mg total) under the tongue every 5 (five) minutes x 3 doses as needed for chest pain. 25 tablet 2  . Omega-3 Fatty Acids (FISH OIL) 1000 MG CAPS Take 1,000 mg by mouth daily.    Marland Kitchen omeprazole (PRILOSEC OTC) 20 MG tablet Take 1 tablet (20 mg total) by mouth daily. 100 tablet 4  . PROAIR HFA 108 (90 Base) MCG/ACT inhaler INHALE 2 PUFFS INTO THE LUNGS EVERY 6 HOURS AS NEEDED FOR WHEEZING 8.5 g 5  . tamsulosin (FLOMAX) 0.4 MG CAPS capsule TAKE ONE CAPSULE BY MOUTH DAILY (Patient taking differently:  Take 0.4 mg by mouth daily after supper. TAKE ONE CAPSULE BY MOUTH DAILY) 100 capsule 4   No current facility-administered medications for this encounter.    No Known Allergies  Social History   Social History  . Marital Status: Married    Spouse Name: N/A  . Number of Children: 2  . Years of Education: N/A   Occupational History  . DOCTOR    Social History Main Topics  . Smoking status: Never Smoker   . Smokeless tobacco: Never Used  . Alcohol Use: No  . Drug Use: No  . Sexual Activity: Not Currently   Other Topics Concern  . Not on file   Social History Narrative   Married. Regular exercise - yes.     Family History  Problem Relation Age of Onset  . Coronary artery disease Mother     deceased  . Deep vein thrombosis Sister   . Coronary artery disease Brother   . Asthma Brother   . Colon cancer Neg Hx     ROS- All systems are reviewed and negative except as per the HPI above  Physical Exam: Filed Vitals:   07/02/15 0911  BP: 122/60  Pulse: 62  Height: 5' 10.5" (1.791 m)  Weight: 161 lb 12.8 oz (73.392 kg)    GEN- The patient is well appearing, alert and oriented x 3 today.   Head- normocephalic, atraumatic Eyes-  Sclera clear, conjunctiva pink Ears- hearing intact Oropharynx- clear Neck- supple, no JVP Lymph- no cervical lymphadenopathy Lungs- Clear to ausculation bilaterally, normal work of breathing Heart- Regular rate and rhythm, no murmurs, rubs or gallops, PMI not laterally displaced GI- soft, NT, ND, + BS Extremities- no clubbing, cyanosis, or edema MS- no significant deformity or atrophy Skin- no rash or lesion Psych- euthymic mood, full affect Neuro- strength and sensation are intact  EKG-NSR at 62 bpm, qrs int 76 ms, QTc 426 ms(qtc stable) Epic records reviewed  Assessment and Plan: 1. PAF Doing well maintaining SR with tikosyn Continue atenolol Continue eliquis Bmet, mag today  2. Previous CVA Doing well, no more nuero  symptoms Taking eliquis without fail  Scheduled to see Dr. Aundra Dubin for f/u 6/9, cn discuss with Dr. Aundra Dubin, f/u for tikosyn, I will be glad to see every other 3 months for following of tikosyn if Dr. Aundra Dubin cannot incorporate into his schedule.   Geroge Baseman Staley Budzinski, [facility] [address] [place], [place] [address] [phone]

## 2015-07-10 DIAGNOSIS — H26491 Other secondary cataract, right eye: Secondary | ICD-10-CM | POA: Diagnosis not present

## 2015-07-15 DIAGNOSIS — J4530 Mild persistent asthma, uncomplicated: Secondary | ICD-10-CM | POA: Diagnosis not present

## 2015-07-15 DIAGNOSIS — J309 Allergic rhinitis, unspecified: Secondary | ICD-10-CM | POA: Diagnosis not present

## 2015-07-15 DIAGNOSIS — R05 Cough: Secondary | ICD-10-CM | POA: Diagnosis not present

## 2015-07-18 ENCOUNTER — Ambulatory Visit (INDEPENDENT_AMBULATORY_CARE_PROVIDER_SITE_OTHER): Payer: Medicare Other | Admitting: Cardiology

## 2015-07-18 ENCOUNTER — Encounter: Payer: Self-pay | Admitting: Cardiology

## 2015-07-18 VITALS — BP 128/72 | HR 59 | Ht 70.0 in | Wt 159.0 lb

## 2015-07-18 DIAGNOSIS — I480 Paroxysmal atrial fibrillation: Secondary | ICD-10-CM | POA: Diagnosis not present

## 2015-07-18 NOTE — Patient Instructions (Signed)
Medication Instructions:  Your physician recommends that you continue on your current medications as directed. Please refer to the Current Medication list given to you today.   Labwork: None   Testing/Procedures: none  Follow-Up: Your physician recommends that you schedule a follow-up appointment in: 3 months with Dr Aundra Dubin.         If you need a refill on your cardiac medications before your next appointment, please call your pharmacy.

## 2015-07-20 NOTE — Progress Notes (Signed)
Patient ID: [name] Spence, MD, male   DOB: 06/11/1929, 82 y.o.   MRN: [id number] PCP: Dr. 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  He had an RV infarct with chronic total occlusion of a small nondominant RCA in the 1980s.  He had a drug-eluting stent to the CFX in 11/10.    He is very symptomatic with atrial fibrillation episodes. He has had a chronic pattern of mild shortness of breath and mild left shoulder pain/chest aching when walking up a hill (x 20 years).   He can walk 3 miles on flat ground without problems.  He golfs 3-4 times a week.  He walks for 20-30 minutes on a treadmill several times a week.   Echo in 7/16 showed normal LV systolic function, EF 123456 with moderate diastolic dysfunction.  Moderate TR with PA systolic pressure 65 mmHg.  Normal RV size and systolic function.  Given volume overload on exam and abnormal echo, he was started on low dose Lasix in the past.    He was started on Multaq but continued to have episodes of atrial fibrillation.  This was stopped, and he was admitted for Tikosyn loading.  After he went home, he developed left facial droop and left arm weakness.  He was found to have a small right parietal cortical infarct with hemorrhagic conversion.  His symptoms resolved quickly. INR was subtherapeutic at admission.  Warfarin was stopped and he was put on apixaban.   He is now doing well.  Seems to be staying out of atrial fibrillation, no palpitations.  He remains short of breath walking up a hill but this is stable long-term.  Breathing is actually better with more aggressive asthma treatment.  No chest pain.  Playing golf again.  No BRBPR or melena.  More active.  Weight is down 3 lbs.    Labs (6/11): LDL 63, HDL 35, K 5.5, creatinine 1.1 Labs (6/12): LDL 58, HDL 53, TSH normal, K 4.7, creatinine 1 Labs (12/12): BNP 79 Labs (7/14): K 5, creatinine 1.1, LDL 48, HDL 34 Labs  (8/15): LDL 48, HDL 33, TSH normal, K 5.4, creatinine 1.2 Labs (6/16): K 5.1, creatinine 1.27, HCT 42.5 Labs (10/16): LDL 37, HDL 32 Labs (11/16): K 4.4, creatinine 1.36, BNP 103 Labs  (2/17): K 4.5, creatinine 1.38, Mg 2.0, LDL 32 Labs (3/17): HCT 41.7 Labs (5/17): K 4.2, creatinine 1.17, Mg 2.1  PMH: 1. CAD: RV infarct in the 1980s.  Chronic total occlusion of a small nondominant RCA.  11/10 PCI to CFX with Xience DES.  EF 60% on LV-gram at that time. ETT (11/14) with 9'07" exercise, no significant ST depression.  2.  Paroxysmal atrial fibrillation: Multaq was ineffective. Now on Tikosyn.  3.  Hyperlipidemia 4. GERD 5. HTN 6. Asthma 7. Chronic diastolic CHF: Echo ([id number]): EF 55-60%, mild LV hypertrophy, no regional WMAs, mild AI, mild MR, mild-moderate biatrial enlargement, PA systolic pressure 51 mmHg. Echo (7/16) with EF 60-65%, moderate diastolic dysfunction, normal RV size and systolic function, mild-moderate MR, moderate TR, moderate biatrial enlargement, PA systolic pressure 65 mmHg.  8. CKD 9. CVA: Right parietal cortical CVA with peri-infarct hemorrhagic conversion.  CTA showed right M2 branch flow gap.    SH: Nonsmoker.  Retired Stage manager, lives in [place].  2 daughters.  Originally from [place].   FH: Brother with CABG.   ROS: All systems reviewed and negative except as per HPI.  Current Outpatient Prescriptions  Medication Sig Dispense Refill  . apixaban (ELIQUIS) 5 MG TABS tablet Take 1 tablet (5 mg total) by mouth 2 (two) times daily. 60 tablet 6  . ARNUITY ELLIPTA 200 MCG/ACT AEPB Inhale 1 puff into the lungs daily.    Marland Kitchen atenolol (TENORMIN) 25 MG tablet Take 1 tablet (25 mg total) by mouth 2 (two) times daily. 180 tablet 3  . atorvastatin (LIPITOR) 20 MG tablet Take 1 tablet (20 mg total) by mouth daily. (Patient taking differently: Take 20 mg by mouth daily at 6 PM. ) 90 tablet 3  . buPROPion (WELLBUTRIN SR) 150 MG 12 hr tablet Take 1 tablet (150 mg total) by  mouth daily. 100 tablet 4  . cetirizine (ZYRTEC) 10 MG tablet Take 10 mg by mouth daily.    . Cholecalciferol (VITAMIN D3) 400 UNITS CAPS Take 400 Units by mouth daily.     Marland Kitchen dofetilide (TIKOSYN) 250 MCG capsule Take 1 capsule (250 mcg total) by mouth 2 (two) times daily. 14 capsule 0  . fluticasone (FLONASE) 50 MCG/ACT nasal spray Place 2 sprays into both nostrils daily. 32 g 6  . furosemide (LASIX) 20 MG tablet Take 20 mg by mouth every other day. 1 tablet my mouth every other day.    . montelukast (SINGULAIR) 10 MG tablet TAKE 1 TABLET BY MOUTH ONCE DAILY (Patient taking differently: Take 10 mg by mouth at bedtime. TAKE 1 TABLET BY MOUTH ONCE DAILY) 90 tablet 4  . nitroGLYCERIN (NITROSTAT) 0.4 MG SL tablet Place 1 tablet (0.4 mg total) under the tongue every 5 (five) minutes x 3 doses as needed for chest pain. 25 tablet 2  . Omega-3 Fatty Acids (FISH OIL) 1000 MG CAPS Take 1,000 mg by mouth daily.    Marland Kitchen omeprazole (PRILOSEC OTC) 20 MG tablet Take 1 tablet (20 mg total) by mouth daily. 100 tablet 4  . PROAIR HFA 108 (90 Base) MCG/ACT inhaler INHALE 2 PUFFS INTO THE LUNGS EVERY 6 HOURS AS NEEDED FOR WHEEZING 8.5 g 5  . tamsulosin (FLOMAX) 0.4 MG CAPS capsule TAKE ONE CAPSULE BY MOUTH DAILY (Patient taking differently: Take 0.4 mg by mouth daily after supper. TAKE ONE CAPSULE BY MOUTH DAILY) 100 capsule 4   No current facility-administered medications for this visit.    BP 128/72 mmHg  Pulse 59  Ht 5\' 10"  (1.778 m)  Wt 159 lb (72.122 kg)  BMI 22.81 kg/m2  SpO2 98% General: NAD Neck: JVP 7 cm, no thyromegaly or thyroid nodule.  Lungs: Rare squeak in lung fields.  CV: Nondisplaced PMI.  Heart regular S1/S2, soft S4, no murmur.  No edema.  No carotid bruit.  Normal pedal pulses.  Abdomen: Soft, nontender, no hepatosplenomegaly, no distention.  Neurologic: Alert and oriented x 3.  Psych: Normal affect. Extremities: No clubbing or cyanosis.   Assessment/Plan: 1. CAD: Dr Caryn Section has had a  long-time pattern of dyspnea and mild shoulder/chest pain with walking up a hill.  This has really not changed  He is still able to golf, walk on a treadmill, etc with no problem.  ETT in 11/14 was a normal study.  - If he has any change in his exertional symptoms, he will let me know and I will arrange for ETT-Cardiolite.   - He is on apixaban without ASA given stable CAD.   - Continue statin, beta blocker.  2. Hyperlipidemia: Good lipids in 2/17.    3. Chronic diastolic CHF: Echo in [id number] suggested progressive diastolic dysfunction.  EF 60-65% with grade II diastolic dysfunction and PA systolic pressure up to 65 mmHg.  RV appeared normal. He looks euvolemic today on Lasix 20 mg every other day.  Recent BMET ok. 4. Atrial fibrillation: Paroxysmal.  Episodes are symptomatic, he feels "bad" with atrial fibrillation even when he's rate-controlled. He had frequent breakthrough on Multaq but seems to be doing much better on Tikosyn. NSR today.  - Continue Tikosyn.  Recent labs and ECG ok. - If has breakthrough afib on Tikosyn, amiodarone would be an option.  - Now on apixaban with CVA on warfarin (was subtherapeutic).   5. HTN: BP controlled.  6. CKD: He is no longer using NSAIDs.  BMET stable recently.  Followup in 3 months.     Loralie Champagne 07/20/2015

## 2015-07-21 DIAGNOSIS — J309 Allergic rhinitis, unspecified: Secondary | ICD-10-CM | POA: Diagnosis not present

## 2015-08-14 DIAGNOSIS — J309 Allergic rhinitis, unspecified: Secondary | ICD-10-CM | POA: Diagnosis not present

## 2015-08-14 DIAGNOSIS — J4530 Mild persistent asthma, uncomplicated: Secondary | ICD-10-CM | POA: Diagnosis not present

## 2015-09-09 DIAGNOSIS — L570 Actinic keratosis: Secondary | ICD-10-CM | POA: Diagnosis not present

## 2015-09-09 DIAGNOSIS — D1801 Hemangioma of skin and subcutaneous tissue: Secondary | ICD-10-CM | POA: Diagnosis not present

## 2015-09-09 DIAGNOSIS — L821 Other seborrheic keratosis: Secondary | ICD-10-CM | POA: Diagnosis not present

## 2015-09-15 ENCOUNTER — Ambulatory Visit (INDEPENDENT_AMBULATORY_CARE_PROVIDER_SITE_OTHER): Payer: Medicare Other

## 2015-09-15 ENCOUNTER — Ambulatory Visit (INDEPENDENT_AMBULATORY_CARE_PROVIDER_SITE_OTHER): Payer: Medicare Other | Admitting: Podiatry

## 2015-09-15 ENCOUNTER — Encounter: Payer: Self-pay | Admitting: Podiatry

## 2015-09-15 VITALS — BP 120/71 | HR 63 | Resp 16 | Ht 70.5 in | Wt 160.0 lb

## 2015-09-15 DIAGNOSIS — M25571 Pain in right ankle and joints of right foot: Secondary | ICD-10-CM

## 2015-09-15 DIAGNOSIS — M779 Enthesopathy, unspecified: Secondary | ICD-10-CM | POA: Diagnosis not present

## 2015-09-15 MED ORDER — TRIAMCINOLONE ACETONIDE 10 MG/ML IJ SUSP
10.0000 mg | Freq: Once | INTRAMUSCULAR | Status: AC
  Administered 2015-09-15: 10 mg

## 2015-09-15 NOTE — Progress Notes (Signed)
   Subjective:    Patient ID: [name] Spence, MD, male    DOB: Jul 21, 1929, 82 y.o.   MRN: [id number]  HPI Chief Complaint  Patient presents with  . Ankle Pain    Right foot-medial side; pt stated, "Has had pain for the past 13 years"      Review of Systems  HENT: Positive for hearing loss.   Musculoskeletal: Positive for arthralgias.  All other systems reviewed and are negative.      Objective:   Physical Exam        Assessment & Plan:

## 2015-09-16 NOTE — Progress Notes (Signed)
Subjective:     Patient ID: [name] Spence, MD, male   DOB: [birth year], 81 y.o.   MRN: [id number]  HPI patient presents stating he's having a lot of pain in his right ankle that has started over the last month or 2 and it's been gradually becoming more irritative   Review of Systems  All other systems reviewed and are negative.      Objective:   Physical Exam  Constitutional: He is oriented to person, place, and time.  Cardiovascular: Intact distal pulses.   Musculoskeletal: Normal range of motion.  Neurological: He is oriented to person, place, and time.  Skin: Skin is warm.  Nursing note and vitals reviewed.  neurovascular status intact muscle strength adequate range of motion within normal limits with patient found to have discomfort in the posterior tibial tendon right with inflammation and depression of the arch noted with mild hypertrophy of the navicular bone right over left. Patient's found have good digital perfusion and is well oriented 3     Assessment:     Inflammatory posterior tibial tendinitis with no indications of rupture at this time along with flatfoot deformity and enlargement of the navicular right    Plan:     H&P and conditions reviewed with patient. Today careful sheath injection administered right 3 mg Kenalog 5 mg Xylocaine and advised on utilization of bracing to hold the arch up. Gave instructions on physical therapy and reappoint to recheck  X-ray was reviewed indicating significant depression of the arch right over left with no indication of fracture and moderate arthritic symptoms noted

## 2015-09-29 ENCOUNTER — Ambulatory Visit (INDEPENDENT_AMBULATORY_CARE_PROVIDER_SITE_OTHER): Payer: Medicare Other | Admitting: Podiatry

## 2015-09-29 ENCOUNTER — Encounter: Payer: Self-pay | Admitting: Podiatry

## 2015-09-29 DIAGNOSIS — M76829 Posterior tibial tendinitis, unspecified leg: Secondary | ICD-10-CM

## 2015-09-29 DIAGNOSIS — M2141 Flat foot [pes planus] (acquired), right foot: Secondary | ICD-10-CM

## 2015-09-29 DIAGNOSIS — M76821 Posterior tibial tendinitis, right leg: Secondary | ICD-10-CM | POA: Diagnosis not present

## 2015-09-29 DIAGNOSIS — M76822 Posterior tibial tendinitis, left leg: Secondary | ICD-10-CM | POA: Diagnosis not present

## 2015-09-29 DIAGNOSIS — M6789 Other specified disorders of synovium and tendon, multiple sites: Secondary | ICD-10-CM | POA: Diagnosis not present

## 2015-09-29 DIAGNOSIS — M25571 Pain in right ankle and joints of right foot: Secondary | ICD-10-CM | POA: Diagnosis not present

## 2015-10-01 NOTE — Progress Notes (Signed)
Subjective:     Patient ID: [name] Spence, MD, male   DOB: 1929-07-11, 82 y.o.   MRN: [id number]  HPI patient presents stating my right foot is improving but still is sore and I know my flat foot has something to do with this   Review of Systems     Objective:   Physical Exam Neurovascular status intact muscle strength adequate with inflammation still noted around the tendinous insertion right posterior tib into the navicular with mild edema within the area    Assessment:     Continued posterior tibial tendinitis that's improved but still present    Plan:     Reviewed condition and recommended continued support shoes and scanned for custom orthotics to lift the arch. Reappoint earlier if any other issues should occur

## 2015-10-09 ENCOUNTER — Encounter: Payer: Self-pay | Admitting: Cardiology

## 2015-10-15 ENCOUNTER — Ambulatory Visit (HOSPITAL_COMMUNITY)
Admission: RE | Admit: 2015-10-15 | Discharge: 2015-10-15 | Disposition: A | Discharge status: Home / Self Care | Payer: Medicare Other | Source: Ambulatory Visit | Attending: Nurse Practitioner | Admitting: Nurse Practitioner

## 2015-10-15 ENCOUNTER — Encounter (HOSPITAL_COMMUNITY): Payer: Self-pay | Admitting: Nurse Practitioner

## 2015-10-15 VITALS — BP 120/72 | HR 64 | Ht 70.5 in | Wt 160.0 lb

## 2015-10-15 DIAGNOSIS — I480 Paroxysmal atrial fibrillation: Secondary | ICD-10-CM | POA: Diagnosis not present

## 2015-10-15 DIAGNOSIS — Z8673 Personal history of transient ischemic attack (TIA), and cerebral infarction without residual deficits: Secondary | ICD-10-CM | POA: Diagnosis not present

## 2015-10-15 DIAGNOSIS — E785 Hyperlipidemia, unspecified: Secondary | ICD-10-CM | POA: Diagnosis not present

## 2015-10-15 DIAGNOSIS — F419 Anxiety disorder, unspecified: Secondary | ICD-10-CM | POA: Insufficient documentation

## 2015-10-15 DIAGNOSIS — Z79899 Other long term (current) drug therapy: Secondary | ICD-10-CM | POA: Diagnosis not present

## 2015-10-15 DIAGNOSIS — Z7901 Long term (current) use of anticoagulants: Secondary | ICD-10-CM | POA: Insufficient documentation

## 2015-10-15 DIAGNOSIS — I10 Essential (primary) hypertension: Secondary | ICD-10-CM | POA: Diagnosis not present

## 2015-10-15 DIAGNOSIS — I2510 Atherosclerotic heart disease of native coronary artery without angina pectoris: Secondary | ICD-10-CM | POA: Insufficient documentation

## 2015-10-15 DIAGNOSIS — F329 Major depressive disorder, single episode, unspecified: Secondary | ICD-10-CM | POA: Diagnosis not present

## 2015-10-15 LAB — BASIC METABOLIC PANEL
Anion gap: 6 (ref 5–15)
BUN: 19 mg/dL (ref 6–20)
CHLORIDE: 104 mmol/L (ref 101–111)
CO2: 26 mmol/L (ref 22–32)
Calcium: 9.2 mg/dL (ref 8.9–10.3)
Creatinine, Ser: 1.22 mg/dL (ref 0.61–1.24)
GFR calc Af Amer: >60 mL/min (ref >60)
GFR calc non Af Amer: 53 mL/min — ABNORMAL LOW (ref >60)
GLUCOSE: 155 mg/dL — AB (ref 65–99)
POTASSIUM: 4.5 mmol/L (ref 3.5–5.1)
Sodium: 136 mmol/L (ref 135–145)

## 2015-10-15 LAB — MAGNESIUM: MAGNESIUM: 2.1 mg/dL (ref 1.7–2.4)

## 2015-10-15 NOTE — Progress Notes (Signed)
Patient ID: [name] Spence, MD, male   DOB: 25-Mar-1929, 82 y.o.   MRN: [id number]     Primary Care Physician: [name], MD Referring Physician: Dr. Robyn Haber, MD is a 82 y.o. male with a h/o PAF on Tikosyn for f/u in the afib clinic. He has only noticed two breakthrough epiosodes,over the last two months, both have lasted around 8-10 hours. Usually will resolve with next dose of tikosyn/metoprolol. Has been staying active playing golf and has not had any issues with activity bringing on afib. He has not had any further symptoms of stroke and is doing well on eliquis without bleeding issues. QTc stable today. He is pleased with his current afib burden.  Today, he denies symptoms of palpitations, chest pain, shortness of breath, orthopnea, PND, lower extremity edema, dizziness, presyncope, syncope, or neurologic sequela. The patient is tolerating medications without difficulties and is otherwise without complaint today.   Past Medical History:  Diagnosis Date  . Allergic rhinitis   . Anxiety   . Asthma   . Atrial fibrillation ([place])   . CAD (coronary artery disease)   . Depression   . Diverticulosis 03/16/1995  . DJD (degenerative joint disease)    "most joints" (03/11/2015)  . GERD (gastroesophageal reflux disease) 12/30/2000  . Heart attack ([place]) 1984  . Hiatal hernia 12/30/2000  . History of blood transfusion 1988   "related to GI bleeding OR"  . History of duodenal ulcer 10/15/1986  . Hyperlipidemia   . Hypertension   . Sinus bradycardia   . Stroke [facility])    Past Surgical History:  Procedure Laterality Date  . CARDIAC CATHETERIZATION    . CATARACT EXTRACTION W/ INTRAOCULAR LENS  IMPLANT, BILATERAL Bilateral ?2013  . CHOLECYSTECTOMY OPEN  1989  . CORONARY ANGIOPLASTY WITH STENT PLACEMENT  2010  . hospital ccu  1984   heart attack, hypoplastic right coronary  . INGUINAL HERNIA REPAIR Right ~ 1990  . NASAL SEPTUM SURGERY  1972  . TONSILLECTOMY    . TOTAL KNEE  ARTHROPLASTY Bilateral 1998-2004   "right-left"  . VAGOTOMY AND PYLOROPLASTY  1988   "bleeding duodenal ulcer"    Current Outpatient Prescriptions  Medication Sig Dispense Refill  . apixaban (ELIQUIS) 5 MG TABS tablet Take 1 tablet (5 mg total) by mouth 2 (two) times daily. 60 tablet 6  . ARNUITY ELLIPTA 200 MCG/ACT AEPB Inhale 1 puff into the lungs daily.    Marland Kitchen atenolol (TENORMIN) 25 MG tablet Take 1 tablet (25 mg total) by mouth 2 (two) times daily. 180 tablet 3  . atorvastatin (LIPITOR) 20 MG tablet Take 1 tablet (20 mg total) by mouth daily. (Patient taking differently: Take 20 mg by mouth daily at 6 PM. ) 90 tablet 3  . buPROPion (WELLBUTRIN SR) 150 MG 12 hr tablet Take 1 tablet (150 mg total) by mouth daily. 100 tablet 4  . cetirizine (ZYRTEC) 10 MG tablet Take 10 mg by mouth daily.    . Cholecalciferol (VITAMIN D3) 400 UNITS CAPS Take 400 Units by mouth daily.     Marland Kitchen dofetilide (TIKOSYN) 250 MCG capsule Take 1 capsule (250 mcg total) by mouth 2 (two) times daily. 14 capsule 0  . fluticasone (FLONASE) 50 MCG/ACT nasal spray Place 2 sprays into both nostrils daily. 32 g 6  . furosemide (LASIX) 20 MG tablet Take 20 mg by mouth as needed. 1 tablet my mouth every other day.    . nitroGLYCERIN (NITROSTAT) 0.4 MG SL tablet Place  1 tablet (0.4 mg total) under the tongue every 5 (five) minutes x 3 doses as needed for chest pain. 25 tablet 2  . Omega-3 Fatty Acids (FISH OIL) 1000 MG CAPS Take 1,000 mg by mouth daily.    Marland Kitchen omeprazole (PRILOSEC OTC) 20 MG tablet Take 1 tablet (20 mg total) by mouth daily. 100 tablet 4  . tamsulosin (FLOMAX) 0.4 MG CAPS capsule TAKE ONE CAPSULE BY MOUTH DAILY (Patient taking differently: Take 0.4 mg by mouth daily after supper. TAKE ONE CAPSULE BY MOUTH DAILY) 100 capsule 4  . PROAIR HFA 108 (90 Base) MCG/ACT inhaler INHALE 2 PUFFS INTO THE LUNGS EVERY 6 HOURS AS NEEDED FOR WHEEZING (Patient not taking: Reported on 10/15/2015) 8.5 g 5   No current facility-administered  medications for this encounter.     Allergies  Allergen Reactions  . Advil [Ibuprofen] Nausea Only and Rash    Social History   Social History  . Marital status: Married    Spouse name: N/A  . Number of children: 2  . Years of education: N/A   Occupational History  . DOCTOR Retired   Social History Main Topics  . Smoking status: Never Smoker  . Smokeless tobacco: Never Used  . Alcohol use No  . Drug use: No  . Sexual activity: Not Currently   Other Topics Concern  . Not on file   Social History Narrative   Married. Regular exercise - yes.     Family History  Problem Relation Age of Onset  . Coronary artery disease Mother     deceased  . Deep vein thrombosis Sister   . Coronary artery disease Brother   . Asthma Brother   . Colon cancer Neg Hx     ROS- All systems are reviewed and negative except as per the HPI above  Physical Exam: Vitals:   10/15/15 0901  BP: 120/72  Pulse: 64  Weight: 160 lb (72.6 kg)  Height: 5' 10.5" (1.791 m)    GEN- The patient is well appearing, alert and oriented x 3 today.   Head- normocephalic, atraumatic Eyes-  Sclera clear, conjunctiva pink Ears- hearing intact Oropharynx- clear Neck- supple, no JVP Lymph- no cervical lymphadenopathy Lungs- Clear to ausculation bilaterally, normal work of breathing Heart- Regular rate and rhythm, no murmurs, rubs or gallops, PMI not laterally displaced GI- soft, NT, ND, + BS Extremities- no clubbing, cyanosis, or edema MS- no significant deformity or atrophy Skin- no rash or lesion Psych- euthymic mood, full affect Neuro- strength and sensation are intact  EKG-NSR at 64 bpm, qrs int 84 ms, QTc 449 ms(qtc stable) Epic records reviewed  Assessment and Plan: 1. PAF Doing well maintaining SR with tikosyn, low afib burden Continue atenolol Continue eliquis Bmet, mag today  2. Previous CVA Doing well, no more nuero symptoms Taking eliquis without fail  Scheduled to see Dr.  Aundra Dubin in [place] discuss with Dr. Aundra Dubin, f/u for tikosyn, I will be glad to see every other 3 months for following of tikosyn if Dr. Aundra Dubin cannot incorporate into his schedule.   Geroge Baseman Hinley Brimage, [facility] [address] [place], [place] [address] [phone]

## 2015-10-19 ENCOUNTER — Emergency Department (HOSPITAL_COMMUNITY): Payer: Medicare Other

## 2015-10-19 ENCOUNTER — Encounter (HOSPITAL_COMMUNITY): Payer: Self-pay

## 2015-10-19 ENCOUNTER — Inpatient Hospital Stay (HOSPITAL_COMMUNITY)
Admission: EM | Admit: 2015-10-19 | Discharge: 2015-10-21 | DRG: 193 | Disposition: A | Discharge status: Home / Self Care | Payer: Medicare Other | Attending: Internal Medicine | Admitting: Internal Medicine

## 2015-10-19 DIAGNOSIS — E785 Hyperlipidemia, unspecified: Secondary | ICD-10-CM | POA: Diagnosis present

## 2015-10-19 DIAGNOSIS — Z96653 Presence of artificial knee joint, bilateral: Secondary | ICD-10-CM | POA: Diagnosis not present

## 2015-10-19 DIAGNOSIS — R0602 Shortness of breath: Secondary | ICD-10-CM | POA: Diagnosis not present

## 2015-10-19 DIAGNOSIS — J4520 Mild intermittent asthma, uncomplicated: Secondary | ICD-10-CM | POA: Diagnosis not present

## 2015-10-19 DIAGNOSIS — I2510 Atherosclerotic heart disease of native coronary artery without angina pectoris: Secondary | ICD-10-CM | POA: Diagnosis present

## 2015-10-19 DIAGNOSIS — J189 Pneumonia, unspecified organism: Principal | ICD-10-CM

## 2015-10-19 DIAGNOSIS — Z955 Presence of coronary angioplasty implant and graft: Secondary | ICD-10-CM | POA: Diagnosis not present

## 2015-10-19 DIAGNOSIS — I10 Essential (primary) hypertension: Secondary | ICD-10-CM | POA: Diagnosis not present

## 2015-10-19 DIAGNOSIS — Z7951 Long term (current) use of inhaled steroids: Secondary | ICD-10-CM

## 2015-10-19 DIAGNOSIS — J45909 Unspecified asthma, uncomplicated: Secondary | ICD-10-CM | POA: Diagnosis present

## 2015-10-19 DIAGNOSIS — K210 Gastro-esophageal reflux disease with esophagitis: Secondary | ICD-10-CM | POA: Diagnosis not present

## 2015-10-19 DIAGNOSIS — J4530 Mild persistent asthma, uncomplicated: Secondary | ICD-10-CM | POA: Diagnosis present

## 2015-10-19 DIAGNOSIS — I519 Heart disease, unspecified: Secondary | ICD-10-CM

## 2015-10-19 DIAGNOSIS — F329 Major depressive disorder, single episode, unspecified: Secondary | ICD-10-CM | POA: Diagnosis present

## 2015-10-19 DIAGNOSIS — R0902 Hypoxemia: Secondary | ICD-10-CM | POA: Diagnosis not present

## 2015-10-19 DIAGNOSIS — Z7901 Long term (current) use of anticoagulants: Secondary | ICD-10-CM | POA: Diagnosis not present

## 2015-10-19 DIAGNOSIS — Z825 Family history of asthma and other chronic lower respiratory diseases: Secondary | ICD-10-CM | POA: Diagnosis not present

## 2015-10-19 DIAGNOSIS — I480 Paroxysmal atrial fibrillation: Secondary | ICD-10-CM | POA: Diagnosis present

## 2015-10-19 DIAGNOSIS — Z961 Presence of intraocular lens: Secondary | ICD-10-CM | POA: Diagnosis present

## 2015-10-19 DIAGNOSIS — Z79899 Other long term (current) drug therapy: Secondary | ICD-10-CM

## 2015-10-19 DIAGNOSIS — Z8711 Personal history of peptic ulcer disease: Secondary | ICD-10-CM | POA: Diagnosis not present

## 2015-10-19 DIAGNOSIS — Z8673 Personal history of transient ischemic attack (TIA), and cerebral infarction without residual deficits: Secondary | ICD-10-CM

## 2015-10-19 DIAGNOSIS — K219 Gastro-esophageal reflux disease without esophagitis: Secondary | ICD-10-CM | POA: Diagnosis present

## 2015-10-19 DIAGNOSIS — Z8249 Family history of ischemic heart disease and other diseases of the circulatory system: Secondary | ICD-10-CM | POA: Diagnosis not present

## 2015-10-19 DIAGNOSIS — I110 Hypertensive heart disease with heart failure: Secondary | ICD-10-CM | POA: Diagnosis not present

## 2015-10-19 DIAGNOSIS — J9601 Acute respiratory failure with hypoxia: Secondary | ICD-10-CM | POA: Diagnosis present

## 2015-10-19 DIAGNOSIS — I252 Old myocardial infarction: Secondary | ICD-10-CM

## 2015-10-19 DIAGNOSIS — I5032 Chronic diastolic (congestive) heart failure: Secondary | ICD-10-CM | POA: Diagnosis not present

## 2015-10-19 DIAGNOSIS — I5189 Other ill-defined heart diseases: Secondary | ICD-10-CM | POA: Diagnosis present

## 2015-10-19 LAB — CBC
HCT: 44.6 % (ref 39.0–52.0)
HEMOGLOBIN: 14.6 g/dL (ref 13.0–17.0)
MCH: 30.9 pg (ref 26.0–34.0)
MCHC: 32.7 g/dL (ref 30.0–36.0)
MCV: 94.3 fL (ref 78.0–100.0)
PLATELETS: 173 10*3/uL (ref 150–400)
RBC: 4.73 MIL/uL (ref 4.22–5.81)
RDW: 12.9 % (ref 11.5–15.5)
WBC: 15.2 10*3/uL — ABNORMAL HIGH (ref 4.0–10.5)

## 2015-10-19 LAB — URINALYSIS, ROUTINE W REFLEX MICROSCOPIC
Bilirubin Urine: NEGATIVE
GLUCOSE, UA: NEGATIVE mg/dL
HGB URINE DIPSTICK: NEGATIVE
Ketones, ur: NEGATIVE mg/dL
Leukocytes, UA: NEGATIVE
NITRITE: NEGATIVE
PROTEIN: NEGATIVE mg/dL
SPECIFIC GRAVITY, URINE: 1.017 (ref 1.005–1.030)
pH: 6 (ref 5.0–8.0)

## 2015-10-19 LAB — BASIC METABOLIC PANEL
Anion gap: 9 (ref 5–15)
BUN: 16 mg/dL (ref 6–20)
CHLORIDE: 103 mmol/L (ref 101–111)
CO2: 25 mmol/L (ref 22–32)
CREATININE: 1.21 mg/dL (ref 0.61–1.24)
Calcium: 9.3 mg/dL (ref 8.9–10.3)
GFR calc non Af Amer: 53 mL/min — ABNORMAL LOW (ref >60)
GLUCOSE: 133 mg/dL — AB (ref 65–99)
Potassium: 4 mmol/L (ref 3.5–5.1)
Sodium: 137 mmol/L (ref 135–145)

## 2015-10-19 LAB — I-STAT TROPONIN, ED: Troponin i, poc: 0 ng/mL (ref 0.00–0.08)

## 2015-10-19 LAB — BRAIN NATRIURETIC PEPTIDE: B Natriuretic Peptide: 145.7 pg/mL — ABNORMAL HIGH (ref 0.0–100.0)

## 2015-10-19 LAB — STREP PNEUMONIAE URINARY ANTIGEN: Strep Pneumo Urinary Antigen: NEGATIVE

## 2015-10-19 MED ORDER — ONDANSETRON HCL 4 MG/2ML IJ SOLN: 4.0000 mg | Freq: Four times a day (QID) | INTRAMUSCULAR | Status: DC | PRN

## 2015-10-19 MED ORDER — DEXTROSE 5 % IV SOLN
1.0000 g | Freq: Once | INTRAVENOUS | Status: AC
  Administered 2015-10-19: 1 g via INTRAVENOUS
  Filled 2015-10-19: qty 10

## 2015-10-19 MED ORDER — GUAIFENESIN ER 600 MG PO TB12
600.0000 mg | ORAL_TABLET | Freq: Two times a day (BID) | ORAL | Status: DC
  Administered 2015-10-19 – 2015-10-21 (×5): 600 mg via ORAL
  Filled 2015-10-19 (×5): qty 1

## 2015-10-19 MED ORDER — ATENOLOL 50 MG PO TABS: 25.0000 mg | ORAL_TABLET | Freq: Two times a day (BID) | ORAL | Status: DC

## 2015-10-19 MED ORDER — DEXTROSE 5 % IV SOLN
1.0000 g | INTRAVENOUS | Status: DC
  Administered 2015-10-20 – 2015-10-21 (×2): 1 g via INTRAVENOUS
  Filled 2015-10-19 (×2): qty 10

## 2015-10-19 MED ORDER — SODIUM CHLORIDE 0.9 % IV SOLN
INTRAVENOUS | Status: AC
  Administered 2015-10-19: 14:00:00 via INTRAVENOUS

## 2015-10-19 MED ORDER — LEVALBUTEROL HCL 0.63 MG/3ML IN NEBU
0.6300 mg | INHALATION_SOLUTION | Freq: Four times a day (QID) | RESPIRATORY_TRACT | Status: DC
  Administered 2015-10-19 – 2015-10-21 (×7): 0.63 mg via RESPIRATORY_TRACT
  Filled 2015-10-19 (×7): qty 3

## 2015-10-19 MED ORDER — APIXABAN 5 MG PO TABS
5.0000 mg | ORAL_TABLET | Freq: Two times a day (BID) | ORAL | Status: DC
  Administered 2015-10-19 – 2015-10-21 (×4): 5 mg via ORAL
  Filled 2015-10-19 (×5): qty 1

## 2015-10-19 MED ORDER — TRAZODONE HCL 50 MG PO TABS
25.0000 mg | ORAL_TABLET | Freq: Every evening | ORAL | Status: DC | PRN
  Administered 2015-10-19: 25 mg via ORAL
  Filled 2015-10-19: qty 1

## 2015-10-19 MED ORDER — AZITHROMYCIN 500 MG IV SOLR
500.0000 mg | INTRAVENOUS | Status: DC
  Administered 2015-10-20 – 2015-10-21 (×2): 500 mg via INTRAVENOUS
  Filled 2015-10-19 (×2): qty 500

## 2015-10-19 MED ORDER — HYDRALAZINE HCL 20 MG/ML IJ SOLN: 10.0000 mg | Freq: Three times a day (TID) | INTRAMUSCULAR | Status: DC | PRN

## 2015-10-19 MED ORDER — PANTOPRAZOLE SODIUM 40 MG PO TBEC
40.0000 mg | DELAYED_RELEASE_TABLET | Freq: Every day | ORAL | Status: DC
Start: 2015-10-19 — End: 2015-10-21
  Administered 2015-10-19 – 2015-10-21 (×3): 40 mg via ORAL
  Filled 2015-10-19 (×3): qty 1

## 2015-10-19 MED ORDER — BUDESONIDE 0.5 MG/2ML IN SUSP
0.5000 mg | Freq: Two times a day (BID) | RESPIRATORY_TRACT | Status: DC
  Administered 2015-10-19 – 2015-10-21 (×4): 0.5 mg via RESPIRATORY_TRACT
  Filled 2015-10-19 (×4): qty 2

## 2015-10-19 MED ORDER — TAMSULOSIN HCL 0.4 MG PO CAPS
0.4000 mg | ORAL_CAPSULE | Freq: Every day | ORAL | Status: DC
  Administered 2015-10-19 – 2015-10-20 (×2): 0.4 mg via ORAL
  Filled 2015-10-19 (×2): qty 1

## 2015-10-19 MED ORDER — ATENOLOL 50 MG PO TABS
25.0000 mg | ORAL_TABLET | Freq: Two times a day (BID) | ORAL | Status: DC
  Administered 2015-10-19 – 2015-10-21 (×4): 25 mg via ORAL
  Filled 2015-10-19 (×4): qty 1

## 2015-10-19 MED ORDER — AZITHROMYCIN 500 MG IV SOLR
500.0000 mg | Freq: Once | INTRAVENOUS | Status: AC
  Administered 2015-10-19: 500 mg via INTRAVENOUS
  Filled 2015-10-19: qty 500

## 2015-10-19 MED ORDER — IPRATROPIUM-ALBUTEROL 0.5-2.5 (3) MG/3ML IN SOLN
3.0000 mL | RESPIRATORY_TRACT | Status: DC
  Administered 2015-10-19: 3 mL via RESPIRATORY_TRACT
  Filled 2015-10-19: qty 3

## 2015-10-19 MED ORDER — ONDANSETRON HCL 4 MG PO TABS
4.0000 mg | ORAL_TABLET | Freq: Four times a day (QID) | ORAL | Status: DC | PRN
Start: 2015-10-19 — End: 2015-10-21

## 2015-10-19 MED ORDER — POLYETHYLENE GLYCOL 3350 17 G PO PACK: 17.0000 g | PACK | Freq: Every day | ORAL | Status: DC | PRN

## 2015-10-19 MED ORDER — NITROGLYCERIN 0.4 MG SL SUBL: 0.4000 mg | SUBLINGUAL_TABLET | SUBLINGUAL | Status: DC | PRN

## 2015-10-19 MED ORDER — BUPROPION HCL ER (SR) 150 MG PO TB12
150.0000 mg | ORAL_TABLET | Freq: Every day | ORAL | Status: DC
  Administered 2015-10-19 – 2015-10-21 (×3): 150 mg via ORAL
  Filled 2015-10-19 (×3): qty 1

## 2015-10-19 MED ORDER — IPRATROPIUM BROMIDE 0.02 % IN SOLN
0.5000 mg | Freq: Four times a day (QID) | RESPIRATORY_TRACT | Status: DC
  Administered 2015-10-19 – 2015-10-21 (×7): 0.5 mg via RESPIRATORY_TRACT
  Filled 2015-10-19 (×7): qty 2.5

## 2015-10-19 MED ORDER — BISACODYL 5 MG PO TBEC: 5.0000 mg | DELAYED_RELEASE_TABLET | Freq: Every day | ORAL | Status: DC | PRN

## 2015-10-19 MED ORDER — SODIUM CHLORIDE 0.9 % IV SOLN: INTRAVENOUS | Status: DC

## 2015-10-19 MED ORDER — LORATADINE 10 MG PO TABS
10.0000 mg | ORAL_TABLET | Freq: Every day | ORAL | Status: DC
  Administered 2015-10-19 – 2015-10-21 (×3): 10 mg via ORAL
  Filled 2015-10-19 (×3): qty 1

## 2015-10-19 MED ORDER — DOFETILIDE 250 MCG PO CAPS
250.0000 ug | ORAL_CAPSULE | Freq: Two times a day (BID) | ORAL | Status: DC
  Administered 2015-10-19 – 2015-10-21 (×4): 250 ug via ORAL
  Filled 2015-10-19 (×4): qty 1

## 2015-10-19 MED ORDER — ATORVASTATIN CALCIUM 20 MG PO TABS
20.0000 mg | ORAL_TABLET | Freq: Every day | ORAL | Status: DC
  Administered 2015-10-19 – 2015-10-20 (×2): 20 mg via ORAL
  Filled 2015-10-19 (×2): qty 1

## 2015-10-19 NOTE — ED Notes (Signed)
Patient was given a cup of ice water. 

## 2015-10-19 NOTE — ED Notes (Signed)
Blood cultures drawn and awaiting order per admitting phy; antibiotics started after [facility] drawn

## 2015-10-19 NOTE — ED Triage Notes (Signed)
Patient complains of increasing shortness of breath and congestion with cough x 1 week. Denies chest pain, alert and oriented, has hx of asthma

## 2015-10-19 NOTE — H&P (Signed)
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 Patient presented to the emergency department this morning with a one-week history of cough. Patient has asthma, and hearing himself wheezing. Cough nonproductive. No chest pain. No history of pneumonia. No recent hospitalizations. No fevers. Patient states that he feels "dry". Urinating normal.   ED Course:  Hypoxic at 88%, respirations 24 . Oxygen saturation up to 96%, resp now 15 on nasal cannula, patient is afebril with normal heart rate and  BP.  Azithro + Rocephin  Review of Systems: As per HPI, otherwise 10 point review of systems negative.   Past Medical History:  Diagnosis Date  . Allergic rhinitis   . Anxiety   . Asthma   . Atrial fibrillation ([place])   . CAD (coronary artery disease)   . Depression   . Diverticulosis 03/16/1995  . DJD (degenerative joint disease)    "most joints" (03/11/2015)  . GERD (gastroesophageal reflux disease) 12/30/2000  . Heart attack ([place]) 1984  . Hiatal hernia 12/30/2000  . History of blood transfusion 1988   "related to GI bleeding OR"  . History of duodenal ulcer 10/15/1986  . Hyperlipidemia   . Hypertension   . Sinus bradycardia   . Stroke [facility])     Past Surgical History:  Procedure Laterality Date  . CARDIAC CATHETERIZATION    . CATARACT EXTRACTION W/ INTRAOCULAR LENS  IMPLANT, BILATERAL Bilateral ?2013  . CHOLECYSTECTOMY OPEN  1989  . CORONARY ANGIOPLASTY WITH STENT PLACEMENT  2010  . hospital ccu  1984   heart attack, hypoplastic right coronary  . INGUINAL HERNIA REPAIR Right ~ 1990  . NASAL SEPTUM SURGERY  1972  . TONSILLECTOMY    . TOTAL KNEE ARTHROPLASTY Bilateral 1998-2004   "right-left"  . VAGOTOMY AND  PYLOROPLASTY  1988   "bleeding duodenal ulcer"    Social History   Social History  . Marital status: Married    Spouse name: N/A  . Number of children: 2  . Years of education: N/A   Occupational History  . DOCTOR Retired   Social History Main Topics  . Smoking status: Never Smoker  . Smokeless tobacco: Never Used  . Alcohol use No  . Drug use: No  . Sexual activity: Not Currently   Other Topics Concern  . Not on file   Social History Narrative   Married. Regular exercise - yes.   lives at home with wife. No assistive devices needed for ambulation   Allergies  Allergen Reactions  . Advil [Ibuprofen] Nausea Only and Rash  . Asa [Aspirin] Rash  . Tylenol [Acetaminophen] Rash    Family History  Problem Relation Age of Onset  . Coronary artery disease Mother     deceased  . Deep vein thrombosis Sister   . Coronary artery disease Brother   . Asthma Brother   . Colon cancer Neg Hx     Prior to Admission medications   Medication Sig Start Date End Date Taking? Authorizing Provider  apixaban (ELIQUIS) 5 MG TABS tablet Take 1 tablet (5 mg total) by mouth 2 (two) times daily. 04/22/15   Sherran Needs, NP  ARNUITY ELLIPTA 200 MCG/ACT AEPB Inhale  1 puff into the lungs daily. 07/15/15   Historical Provider, MD  atenolol (TENORMIN) 25 MG tablet Take 1 tablet (25 mg total) by mouth 2 (two) times daily. 07/02/15   Sherran Needs, NP  atorvastatin (LIPITOR) 20 MG tablet Take 1 tablet (20 mg total) by mouth daily. Patient taking differently: Take 20 mg by mouth daily at 6 PM.  12/16/14   Larey Dresser, MD  buPROPion [facility] SR) 150 MG 12 hr tablet Take 1 tablet (150 mg total) by mouth daily. 12/02/14   [name], MD  cetirizine (ZYRTEC) 10 MG tablet Take 10 mg by mouth daily.    Historical Provider, MD  Cholecalciferol (VITAMIN D3) 400 UNITS CAPS Take 400 Units by mouth daily.     Historical Provider, MD  dofetilide (TIKOSYN) 250 MCG capsule Take 1 capsule (250 mcg  total) by mouth 2 (two) times daily. 03/14/15   Rhonda G Barrett, PA-C  fluticasone (FLONASE) 50 MCG/ACT nasal spray Place 2 sprays into both nostrils daily. 12/02/14   [name], MD  furosemide (LASIX) 20 MG tablet Take 20 mg by mouth as needed. 1 tablet my mouth every other day. 12/26/14   Larey Dresser, MD  nitroGLYCERIN (NITROSTAT) 0.4 MG SL tablet Place 1 tablet (0.4 mg total) under the tongue every 5 (five) minutes x 3 doses as needed for chest pain. 03/14/15   Erlene Quan, PA-C  Omega-3 Fatty Acids (FISH OIL) 1000 MG CAPS Take 1,000 mg by mouth daily.    Historical Provider, MD  omeprazole (PRILOSEC OTC) 20 MG tablet Take 1 tablet (20 mg total) by mouth daily. 09/17/13   [name], MD  PROAIR HFA 108 [phone] Base) MCG/ACT inhaler INHALE 2 PUFFS INTO THE LUNGS EVERY 6 HOURS AS NEEDED FOR WHEEZING Patient not taking: Reported on 10/15/2015 06/26/15   [name], MD  tamsulosin (FLOMAX) 0.4 MG CAPS capsule TAKE ONE CAPSULE BY MOUTH DAILY Patient taking differently: Take 0.4 mg by mouth daily after supper. TAKE ONE CAPSULE BY MOUTH DAILY 12/02/14   [name], MD    Physical Exam: Vitals:   10/19/15 0840 10/19/15 0900 10/19/15 1030  BP: 115/68  (!) 124/111  Pulse: 96 95 86  Resp: 24 19 15   Temp: 98.5 F (36.9 C)    TempSrc: Oral    SpO2: (!) 88% 96% 96%    Constitutional:  plleasant well developed white male in NAD, calm, comfortable Vitals:   10/19/15 0840 10/19/15 0900 10/19/15 1030  BP: 115/68  (!) 124/111  Pulse: 96 95 86  Resp: 24 19 15   Temp: 98.5 F (36.9 C)    TempSrc: Oral    SpO2: (!) 88% 96% 96%   Eyes: PER, lids and conjunctivae normal ENMT: Mucous membranes are moist. Posterior pharynx clear of any exudate or lesions..  Neck: normal, supple, no masses Respiratory: Bilateral wheezing, occasional rhonchi.  Normal respiratory effort on [place]. .  Cardiovascular: Regular rate and rhythm, no murmurs / rubs / gallops. No extremity edema. 2+ dorsal pedis pulses.    Abdomen: no tenderness, no masses palpated. No hepatomegaly. Bowel sounds positive.  Musculoskeletal: no clubbing / cyanosis. No joint deformity upper and lower extremities. Good ROM, no contractures. Normal muscle tone.  Skin: no rashes, lesions, ulcers.  Neurologic: CN 2-12 grossly intact. Sensation intact, Strength 5/5 in all 4.  Psychiatric: Normal judgment and insight. Alert and oriented x 3. Normal mood.   Labs on Admission: I have personally reviewed following  labs and imaging studies  Radiological Exams on Admission: Dg Chest 2 View  Result Date: 10/19/2015 CLINICAL DATA:  Shortness of breath and wheezing last night and all morning. EXAM: CHEST  2 VIEW COMPARISON:  06/24/2014. FINDINGS: There is new patchy airspace disease in the posterior left lung base, suspicious for pneumonia. Right lung is clear. The cardiopericardial silhouette is within normal limits for size. The visualized bony structures of the thorax are intact. IMPRESSION: Left lower lobe pneumonia. Consider follow-up imaging to ensure resolution. Electronically Signed   By: Misty Stanley M.D.   On: 10/19/2015 09:40    EKG: Independently reviewed.   EKG Interpretation  Date/Time:  Sunday October 19 2015 08:58:31 EDT Ventricular Rate:  92 PR Interval:    QRS Duration: 85 QT Interval:  328 QTC Calculation: 406 R Axis:   48 Text Interpretation:  Sinus rhythm RSR' in V1 or V2, probably normal variant Borderline T abnormalities, inferior leads Confirmed by BELFI  MD, MELANIE ([id number]) on 10/19/2015 9:22:32 AM      Assessment/Plan   Principal Problem:   CAP (community acquired pneumonia) Active Problems:   Asthma   Paroxysmal atrial fibrillation (HCC)   Diastolic dysfunction   Essential hypertension   GERD     Acute respiratory failure with hypoxia secondary to CAP .            -admit to Medical bed -PNA order set utilized -Follow up on blood cultures, sputum gram stain and culture, strep pneumo urinary  antigen -Rocephin and a Zithromax initiated in ED, we will continue -Continue oxygen per nasal cannula  Asthma. He does have some wheezing on exam.  -Duonebs Q4     PAF, rate controlled. Chadsvasc 6-7. -Continue lopressor, Tikosyn and Eliquis  History of CVA. Patient had TIA several months ago. Followed outpatient by neurology.  -On Eliquis for secondary stroke prevention.   History of Hypertension, BP elevated on last reading.  -continue home meds -prn hydralazine  CAD, remote stenting. No chest pain  Chronic diastolic heart failure, grade 2. Echo in 7/16 showed normal LV systolic function, EF [id number] with moderate diastolic dysfunction.  Takes lasix as needed.   Hyperlipidemia.  -Continue home statin  GERD.  . -Continue home PPI  Depression, stable    -Continue home Wellbutrin  DVT prophylaxis:  On Eliquis Code Status:     Full code Family Communication:    Treatment plan discussed with wife in the room and she understands and agrees with the plan..  Disposition Plan:   Discharge in 48-72 hours              Consults called:  None   Admission status:   Admission -  Medical bed   Tye Savoy NP [facility] Pager [phone]  If 7PM-7AM, please contact night-coverage [email or web address] Password [email or web address]  10/19/2015, 10:52 AM

## 2015-10-19 NOTE — Progress Notes (Signed)
Qtc .42 is at this time. Will give dofetilide.

## 2015-10-19 NOTE — ED Notes (Signed)
Admitting at bedside 

## 2015-10-19 NOTE — ED Provider Notes (Signed)
[facility] DEPT Provider Note   CSN: [id number] Arrival date & time: 10/19/15  [id number]     History   Chief Complaint Chief Complaint  Patient presents with  . 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  He reports some wheezing as well as rhonchi. He reports using his albuterol inhaler and feeling like he was in atrial fibrillation. Currently complains of some shortness of breath. Wife describes some the patient had some chills. Patient denies fever. He does not use oxygen at home. Patient also had an episode of nausea and vomiting and one episode of diarrhea last night. No abdominal pain. He has been compliant with his Eliquis, lasix and Tikosyn. He denies chest pain, hemoptysis, hematemesis, hematochezia, urinary symptoms, rashes, syncope.    The history is provided by the patient, the spouse and medical records. No language interpreter was used.  Shortness of Breath  Associated symptoms include a fever, cough, wheezing and vomiting. Pertinent negatives include no headaches, no sore throat, no neck pain, no chest pain, no abdominal pain, no rash and no leg swelling.    Past Medical History:  Diagnosis Date  . Allergic rhinitis   . Anxiety   . Asthma   . Atrial fibrillation ([place])   . CAD (coronary artery disease)   . Depression   . Diverticulosis 03/16/1995  . DJD (degenerative joint disease)    "most joints" (03/11/2015)  . GERD (gastroesophageal reflux disease) 12/30/2000  . Heart attack ([place]) 1984  . Hiatal hernia 12/30/2000  . History of blood transfusion 1988   "related to GI bleeding OR"  . History of duodenal ulcer 10/15/1986  . Hyperlipidemia   . Hypertension   . Sinus bradycardia   . Stroke [facility])     Patient Active Problem List   Diagnosis Date Noted  . CAP (community acquired pneumonia) 10/19/2015  . Acute hemorrhagic infarction of brain ([place])   . Hypersensitivity reaction 04/01/2015  . Persistent atrial fibrillation ([place]) 04/01/2015  . ICH (intracerebral hemorrhage) ([place])   . TIA (transient ischemic attack) 03/31/2015  . Syncope and collapse 03/31/2015  . Hoarseness 03/30/2015  . Visit for monitoring Tikosyn therapy 03/11/2015  . Paroxysmal atrial fibrillation (HCC)   . Pulmonary hypertension ([place]) 09/26/2014  . Encounter for therapeutic drug monitoring 03/06/2013  . Bradycardia 12/21/2012  . Diastolic dysfunction [id number]  . Long term current use of Coumadin 04/20/2010  . CAD S/P OM PCI 2010 12/16/2008  . CHEST PAIN-PRECORDIAL 12/16/2008  . Hyperlipidemia 06/20/2007  . Anxiety state 06/20/2007  . Essential hypertension 06/20/2007  . Allergic rhinitis 06/20/2007  . Asthma 06/20/2007  . GERD 06/20/2007    Past Surgical History:  Procedure Laterality Date  . CARDIAC CATHETERIZATION    . CATARACT EXTRACTION W/ INTRAOCULAR LENS  IMPLANT, BILATERAL Bilateral ?2013  . CHOLECYSTECTOMY OPEN  1989  . CORONARY ANGIOPLASTY WITH STENT PLACEMENT  2010  . hospital ccu  1984   heart attack, hypoplastic right coronary  . INGUINAL HERNIA REPAIR Right ~ 1990  . NASAL SEPTUM SURGERY  1972  . TONSILLECTOMY    . TOTAL KNEE ARTHROPLASTY Bilateral 1998-2004   "right-left"  . VAGOTOMY AND PYLOROPLASTY  1988   "bleeding duodenal ulcer"       Home Medications  Prior to Admission medications   Medication Sig Start Date End Date Taking? Authorizing Provider  apixaban (ELIQUIS) 5 MG TABS tablet Take 1 tablet (5 mg total) by mouth 2 (two) times daily. 04/22/15   Sherran Needs, NP  atenolol (TENORMIN) 25 MG tablet Take 1 tablet (25 mg total) by mouth 2 (two) times daily. 07/02/15   Sherran Needs, NP  atorvastatin (LIPITOR) 20 MG tablet Take 1 tablet (20 mg total) by mouth daily. Patient taking differently: Take  20 mg by mouth daily at 6 PM.  12/16/14   Larey Dresser, MD  buPROPion [facility] SR) 150 MG 12 hr tablet Take 1 tablet (150 mg total) by mouth daily. 12/02/14   [name], MD  cetirizine (ZYRTEC) 10 MG tablet Take 10 mg by mouth daily.    Historical Provider, MD  dofetilide (TIKOSYN) 250 MCG capsule Take 1 capsule (250 mcg total) by mouth 2 (two) times daily. 03/14/15   Rhonda G Barrett, PA-C  fluticasone (FLONASE) 50 MCG/ACT nasal spray Place 2 sprays into both nostrils daily. 12/02/14   [name], MD  furosemide (LASIX) 20 MG tablet Take 20 mg by mouth as needed. 1 tablet my mouth every other day. 12/26/14   Larey Dresser, MD  nitroGLYCERIN (NITROSTAT) 0.4 MG SL tablet Place 1 tablet (0.4 mg total) under the tongue every 5 (five) minutes x 3 doses as needed for chest pain. 03/14/15   Erlene Quan, PA-C  omeprazole (PRILOSEC OTC) 20 MG tablet Take 1 tablet (20 mg total) by mouth daily. 09/17/13   [name], MD  PROAIR HFA 108 [phone] Base) MCG/ACT inhaler INHALE 2 PUFFS INTO THE LUNGS EVERY 6 HOURS AS NEEDED FOR WHEEZING Patient not taking: Reported on 10/15/2015 06/26/15   [name], MD  tamsulosin (FLOMAX) 0.4 MG CAPS capsule TAKE ONE CAPSULE BY MOUTH DAILY Patient taking differently: Take 0.4 mg by mouth daily after supper. TAKE ONE CAPSULE BY MOUTH DAILY 12/02/14   [name], MD    Family History Family History  Problem Relation Age of Onset  . Coronary artery disease Mother     deceased  . Deep vein thrombosis Sister   . Coronary artery disease Brother   . Asthma Brother   . Colon cancer Neg Hx     Social History Social History  Substance Use Topics  . Smoking status: Never Smoker  . Smokeless tobacco: Never Used  . Alcohol use No     Allergies   Advil [ibuprofen]; Asa [aspirin]; and Tylenol [acetaminophen]   Review of Systems Review of Systems  Constitutional: Positive for chills and fever.  HENT: Negative for congestion and sore throat.   Eyes:  Negative for visual disturbance.  Respiratory: Positive for cough, shortness of breath and wheezing.   Cardiovascular: Negative for chest pain and leg swelling.  Gastrointestinal: Positive for diarrhea, nausea and vomiting. Negative for abdominal pain.  Genitourinary: Negative for dysuria and frequency.  Musculoskeletal: Negative for back pain and neck pain.  Skin: Negative for rash.  Neurological: Negative for syncope, light-headedness and headaches.     Physical Exam Updated Vital Signs BP (!) 124/111   Pulse 86   Temp 98.5 F (36.9 C) (Oral)   Resp 15   SpO2 96%   Physical Exam  Constitutional: He appears well-developed and well-nourished. No distress.  Nontoxic appearing.  HENT:  Head: Normocephalic and atraumatic.  Mouth/Throat: Oropharynx is clear and moist.  Eyes: Conjunctivae are normal. Pupils are  equal, round, and reactive to light. Right eye exhibits no discharge. Left eye exhibits no discharge.  Neck: Normal range of motion. Neck supple. No JVD present.  Cardiovascular: Normal rate, regular rhythm, normal heart sounds and intact distal pulses.   Bilateral radial, posterior tibialis and dorsalis pedis pulses are intact.    Pulmonary/Chest: Effort normal. No stridor. No respiratory distress.  Slight crackles noted to his left base. Diminished lung sounds to bilateral bases. No rales or rhonchi. No increased work of breathing.  Abdominal: Soft. There is no tenderness.  Musculoskeletal: He exhibits no edema.  Lymphadenopathy:    He has no cervical adenopathy.  Neurological: He is alert. Coordination normal.  Skin: Skin is warm and dry. Capillary refill takes less than 2 seconds. No rash noted. He is not diaphoretic. No erythema. No pallor.  Psychiatric: He has a normal mood and affect. His behavior is normal.  Nursing note and vitals reviewed.    ED Treatments / Results  Labs (all labs ordered are listed, but only abnormal results are displayed) Labs Reviewed    BASIC METABOLIC PANEL - Abnormal; Notable for the following:       Result Value   Glucose, Bld 133 (*)    GFR calc non Af Amer 53 (*)    All other components within normal limits  CBC - Abnormal; Notable for the following:    WBC 15.2 (*)    All other components within normal limits  BRAIN NATRIURETIC PEPTIDE - Abnormal; Notable for the following:    B Natriuretic Peptide 145.7 (*)    All other components within normal limits  CULTURE, BLOOD (ROUTINE X 2)  CULTURE, BLOOD (ROUTINE X 2)  CULTURE, EXPECTORATED SPUTUM-ASSESSMENT  GRAM STAIN  URINALYSIS, ROUTINE W REFLEX MICROSCOPIC (NOT AT [facility])  HIV ANTIBODY (ROUTINE TESTING)  STREP PNEUMONIAE URINARY ANTIGEN  I-STAT TROPOININ, ED    EKG  EKG Interpretation  Date/Time:  Sunday October 19 2015 08:58:31 EDT Ventricular Rate:  92 PR Interval:    QRS Duration: 85 QT Interval:  328 QTC Calculation: 406 R Axis:   48 Text Interpretation:  Sinus rhythm RSR' in V1 or V2, probably normal variant Borderline T abnormalities, inferior leads Confirmed by BELFI  MD, MELANIE ([id number]) on 10/19/2015 9:22:32 AM       Radiology Dg Chest 2 View  Result Date: 10/19/2015 CLINICAL DATA:  Shortness of breath and wheezing last night and all morning. EXAM: CHEST  2 VIEW COMPARISON:  06/24/2014. FINDINGS: There is new patchy airspace disease in the posterior left lung base, suspicious for pneumonia. Right lung is clear. The cardiopericardial silhouette is within normal limits for size. The visualized bony structures of the thorax are intact. IMPRESSION: Left lower lobe pneumonia. Consider follow-up imaging to ensure resolution. Electronically Signed   By: Misty Stanley M.D.   On: 10/19/2015 09:40    Procedures Procedures (including critical care time)  Medications Ordered in ED Medications  azithromycin (ZITHROMAX) 500 mg in dextrose 5 % 250 mL IVPB (not administered)  Fluticasone Furoate AEPB 1 puff (not administered)  atenolol (TENORMIN) tablet  25 mg (not administered)  apixaban (ELIQUIS) tablet 5 mg (not administered)  dofetilide (TIKOSYN) capsule 250 mcg (not administered)  nitroGLYCERIN (NITROSTAT) SL tablet 0.4 mg (not administered)  atorvastatin (LIPITOR) tablet 20 mg (not administered)  buPROPion (WELLBUTRIN SR) 12 hr tablet 150 mg (not administered)  tamsulosin (FLOMAX) capsule 0.4 mg (not administered)  loratadine (CLARITIN) tablet 10 mg (not administered)  omeprazole (PRILOSEC OTC) EC tablet 20  mg (not administered)  traZODone (DESYREL) tablet 25 mg (not administered)  polyethylene glycol (MIRALAX / GLYCOLAX) packet 17 g (not administered)  bisacodyl (DULCOLAX) EC tablet 5 mg (not administered)  ondansetron (ZOFRAN) tablet 4 mg (not administered)    Or  ondansetron (ZOFRAN) injection 4 mg (not administered)  guaiFENesin (MUCINEX) 12 hr tablet 600 mg (not administered)  0.9 %  sodium chloride infusion (not administered)  ipratropium-albuterol (DUONEB) 0.5-2.5 (3) MG/3ML nebulizer solution 3 mL (not administered)  azithromycin (ZITHROMAX) 500 mg in dextrose 5 % 250 mL IVPB (not administered)  cefTRIAXone (ROCEPHIN) 1 g in dextrose 5 % 50 mL IVPB (not administered)  cefTRIAXone (ROCEPHIN) 1 g in dextrose 5 % 50 mL IVPB (1 g Intravenous New Bag/Given 10/19/15 1055)     Initial Impression / Assessment and Plan / ED Course  I have reviewed the triage vital signs and the nursing notes.  Pertinent labs & imaging results that were available during my care of the patient were reviewed by me and considered in my medical decision making (see chart for details).  Clinical Course   Patient presented to the emergency department complaining of shortness of breath since last night with associated cough, chills and subjective fever. On exam the patient is afebrile nontoxic appearing. Initial oxygen saturation is 88% on room air. The patient is not on home oxygen. On lung exam patient has diminished lung sounds bilaterally with crackles  to his left base. Pulses are intact. Heart rate is 86. Oxygen saturation 96% on 2 L via nasal cannula. No increased work of breathing. EKG is unchanged from his previous tracing. No atrial fibrillation currently. Troponin is not elevated. BMP is unremarkable. CBC is remarkable for white count of 15,000. Chest x-ray reveals a left lower lobe pneumonia. Will start patient on antibiotics for community-acquired pneumonia with azithromycin and Rocephin. As the patient has a new oxygen requirement will admit to medicine for community-acquired pneumonia and hypoxia. Patient agrees with plan.  I consulted with NP Nevin Bloodgood from [facility] who accepted the patient for admission. She will place orders for admission.   This patient was discussed with and evaluated by Dr. Tamera Punt who agrees with assessment and plan.  Final Clinical Impressions(s) / ED Diagnoses   Final diagnoses:  CAP (community acquired pneumonia)    New Prescriptions New Prescriptions   No medications on file     Waynetta Pean, PA-C 10/19/15 [place], MD 10/19/15 1542

## 2015-10-20 ENCOUNTER — Telehealth: Payer: Self-pay | Admitting: Internal Medicine

## 2015-10-20 DIAGNOSIS — J9601 Acute respiratory failure with hypoxia: Secondary | ICD-10-CM

## 2015-10-20 DIAGNOSIS — I480 Paroxysmal atrial fibrillation: Secondary | ICD-10-CM

## 2015-10-20 DIAGNOSIS — K219 Gastro-esophageal reflux disease without esophagitis: Secondary | ICD-10-CM

## 2015-10-20 DIAGNOSIS — I5032 Chronic diastolic (congestive) heart failure: Secondary | ICD-10-CM

## 2015-10-20 DIAGNOSIS — J4520 Mild intermittent asthma, uncomplicated: Secondary | ICD-10-CM

## 2015-10-20 DIAGNOSIS — J189 Pneumonia, unspecified organism: Principal | ICD-10-CM

## 2015-10-20 DIAGNOSIS — I10 Essential (primary) hypertension: Secondary | ICD-10-CM

## 2015-10-20 LAB — HIV ANTIBODY (ROUTINE TESTING W REFLEX): HIV SCREEN 4TH GENERATION: NONREACTIVE

## 2015-10-20 LAB — BASIC METABOLIC PANEL
ANION GAP: 8 (ref 5–15)
BUN: 19 mg/dL (ref 6–20)
CHLORIDE: 102 mmol/L (ref 101–111)
CO2: 25 mmol/L (ref 22–32)
Calcium: 8.4 mg/dL — ABNORMAL LOW (ref 8.9–10.3)
Creatinine, Ser: 1.21 mg/dL (ref 0.61–1.24)
GFR calc Af Amer: >60 mL/min (ref >60)
GFR, EST NON AFRICAN AMERICAN: 53 mL/min — AB (ref >60)
GLUCOSE: 90 mg/dL (ref 65–99)
POTASSIUM: 3.6 mmol/L (ref 3.5–5.1)
Sodium: 135 mmol/L (ref 135–145)

## 2015-10-20 LAB — CBC
HEMATOCRIT: 38.4 % — AB (ref 39.0–52.0)
HEMOGLOBIN: 12 g/dL — AB (ref 13.0–17.0)
MCH: 29.5 pg (ref 26.0–34.0)
MCHC: 31.3 g/dL (ref 30.0–36.0)
MCV: 94.3 fL (ref 78.0–100.0)
Platelets: 149 10*3/uL — ABNORMAL LOW (ref 150–400)
RBC: 4.07 MIL/uL — ABNORMAL LOW (ref 4.22–5.81)
RDW: 13.3 % (ref 11.5–15.5)
WBC: 14.9 10*3/uL — ABNORMAL HIGH (ref 4.0–10.5)

## 2015-10-20 LAB — EXPECTORATED SPUTUM ASSESSMENT W REFEX TO RESP CULTURE

## 2015-10-20 LAB — EXPECTORATED SPUTUM ASSESSMENT W GRAM STAIN, RFLX TO RESP C

## 2015-10-20 MED ORDER — POTASSIUM CHLORIDE CRYS ER 20 MEQ PO TBCR
40.0000 meq | EXTENDED_RELEASE_TABLET | Freq: Once | ORAL | Status: AC
  Administered 2015-10-20: 40 meq via ORAL
  Filled 2015-10-20: qty 2

## 2015-10-20 MED ORDER — MAGNESIUM SULFATE 2 GM/50ML IV SOLN
2.0000 g | Freq: Once | INTRAVENOUS | Status: DC
Start: 2015-10-20 — End: 2015-10-21
  Filled 2015-10-20: qty 50

## 2015-10-20 NOTE — Telephone Encounter (Signed)
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 He is now in hospital for CAP - [facility]. Please get [name], MD to see me later this week or early next week. Let me know if you need to overbook and I Can tell you

## 2015-10-20 NOTE — Progress Notes (Signed)
[facility] PROGRESS NOTE  [name], MD KO:[id number] DOB: 05-24-1929 DOA: 10/19/2015 PCP: [name], MD  Interim summary and HPI 82 y.o. male with a medical history significant for, but not  limited to, CAD, CVA, HTN and PAF. Patient presented to the emergency department this morning with a one-week history of cough. Patient has asthma, and hearing himself wheezing. Cough nonproductive. No chest pain. No history of pneumonia. No recent hospitalizations. No fevers. Patient states that he feels "dry". Urinating normal.   ED Course:  Hypoxic at 88%, respirations 24 . Oxygen saturation up to 96% on 2L of [place]  Assessment/Plan: Acute respiratory failure with hypoxia secondary to LLL CAP .            -will follow PNA order set protocol -Follow up on culture data -Rocephin and a Zithromax to be continued for now -Continue oxygen per nasal cannula as needed and weaned as tolerated  -continue mucinex and flutter valve   Asthma.  -no wheezing heard on exam -continue Duonebs Q4 and pulmicort while inpatient     PAF -currently on A. fib -Chadsvasc 6-7. -Continue lopressor, Tikosyn and Eliquis -will monitor electrolytes and replete them as needed  -goal is potassium > 4 and magnesium of 2.0  History of CVA. Patient had TIA several months ago.  -On Eliquis for secondary stroke prevention.  -continue outpatient follow up with neurology at discharge  History of Hypertension -continue home meds -prn hydralazine  CAD, remote stenting.  -No chest pain -neg troponin   Chronic diastolic heart failure, grade 2. Echo in 7/16 showed normal LV systolic function, EF 123456 with moderate diastolic dysfunction.  -compensated currently -no crackles -continue daily weights and follow low sodium diet   Hyperlipidemia.  -Continue home statin  GERD.  . -Continue home PPI  Depression, stable    -Continue home Wellbutrin  Code Status: Full Family Communication: no  family at bedside  Disposition Plan: continue IV antibiotics, continue pulmicort and PRN nebulizer; weaned oxygen supplementation as tolerated and use flutter valve.   Consultants:  None   Discussed with Dr. Chase Caller (looking to follow patient for his asthma/COPD in 1 week after discharge)  Procedures:  See below for x-ray reports   Antibiotics:  Rocephin and zithromax 10/19/15  HPI/Subjective: Afebrile, still with SOB and using [place] supplementation. Denies CP and reports feeling of palpitation (positive A. Fib on telemetry)  Objective: Vitals:   10/20/15 1058 10/20/15 1330  BP: 117/75 (!) 96/49  Pulse: 85 71  Resp: 18   Temp: 98.3 F (36.8 C)     Intake/Output Summary (Last 24 hours) at 10/20/15 1808 Last data filed at 10/20/15 1700  Gross per 24 hour  Intake              675 ml  Output             1650 ml  Net             -975 ml   Filed Weights   10/19/15 1248  Weight: 70.1 kg (154 lb 8 oz)    Exam:   General:  Still with slight work of breathing and coughing; overall feeling better. Reports palpitations from Atrial fib. No fever.  Cardiovascular: irregular, no rubs, no gallops, no murmurs   Respiratory: no frank wheezing, decrease BS at bases and diffuse rhonchi; no using accessory muscles  Abdomen: soft, NT, ND, positive BS  Musculoskeletal: no edema, no cyanosis, no clubbing   Data Reviewed: Basic Metabolic Panel:  Recent Labs Lab 10/15/15 0900 10/19/15 0859 10/20/15 0401  NA 136 137 135  K 4.5 4.0 3.6  CL 104 103 102  CO2 26 25 25   GLUCOSE 155* 133* 90  BUN 19 16 19   CREATININE 1.22 1.21 1.21  CALCIUM 9.2 9.3 8.4*  MG 2.1  --   --    CBC:  Recent Labs Lab 10/19/15 0859 10/20/15 0401  WBC 15.2* 14.9*  HGB 14.6 12.0*  HCT 44.6 38.4*  MCV 94.3 94.3  PLT 173 149*   BNP (last 3 results)  Recent Labs  12/24/14 1710 10/19/15 0910  BNP 102.8* 145.7*   CBG: No results for input(s): GLUCAP in the last 168 hours.  Recent  Results (from the past 240 hour(s))  Culture, blood (routine x 2) Call MD if unable to obtain prior to antibiotics being given     Status: None (Preliminary result)   Collection Time: 10/19/15  8:59 AM  Result Value Ref Range Status   Specimen Description BLOOD LEFT ANTECUBITAL  Final   Special Requests BOTTLES DRAWN AEROBIC AND ANAEROBIC 5CC  Final   Culture NO GROWTH 1 DAY  Final   Report Status PENDING  Incomplete  Culture, sputum-assessment     Status: None   Collection Time: 10/19/15 11:08 AM  Result Value Ref Range Status   Specimen Description SPUTUM  Final   Special Requests NONE  Final   Sputum evaluation   Final    MICROSCOPIC FINDINGS SUGGEST THAT THIS SPECIMEN IS NOT REPRESENTATIVE OF LOWER RESPIRATORY SECRETIONS. PLEASE RECOLLECT. Gram Stain Report Called to,Read Back By and Verified With: D GARDNER,RN @0614  10/1115 MKELLY    Report Status 10/20/2015 FINAL  Final  Culture, blood (routine x 2) Call MD if unable to obtain prior to antibiotics being given     Status: None (Preliminary result)   Collection Time: 10/19/15 12:04 PM  Result Value Ref Range Status   Specimen Description BLOOD RIGHT FOREARM  Final   Special Requests BOTTLES DRAWN AEROBIC AND ANAEROBIC 5CC  Final   Culture NO GROWTH 1 DAY  Final   Report Status PENDING  Incomplete     Studies: Dg Chest 2 View  Result Date: 10/19/2015 CLINICAL DATA:  Shortness of breath and wheezing last night and all morning. EXAM: CHEST  2 VIEW COMPARISON:  06/24/2014. FINDINGS: There is new patchy airspace disease in the posterior left lung base, suspicious for pneumonia. Right lung is clear. The cardiopericardial silhouette is within normal limits for size. The visualized bony structures of the thorax are intact. IMPRESSION: Left lower lobe pneumonia. Consider follow-up imaging to ensure resolution. Electronically Signed   By: Misty Stanley M.D.   On: 10/19/2015 09:40    Scheduled Meds: . apixaban  5 mg Oral BID  . atenolol   25 mg Oral BID  . atorvastatin  20 mg Oral q1800  . azithromycin  500 mg Intravenous Q24H  . budesonide  0.5 mg Inhalation BID  . buPROPion  150 mg Oral Daily  . cefTRIAXone (ROCEPHIN)  IV  1 g Intravenous Q24H  . dofetilide  250 mcg Oral BID  . guaiFENesin  600 mg Oral BID  . ipratropium  0.5 mg Nebulization QID  . levalbuterol  0.63 mg Nebulization QID  . loratadine  10 mg Oral Daily  . pantoprazole  40 mg Oral Daily  . tamsulosin  0.4 mg Oral QPC supper   Continuous Infusions:   Principal Problem:   CAP (community acquired pneumonia) Active Problems:  Essential hypertension   Asthma   GERD   Diastolic dysfunction   Paroxysmal atrial fibrillation (HCC)    Time spent: 30 minutes    Barton Dubois  [facility] Pager [phone]. If 7PM-7AM, please contact night-coverage at [email or web address], password [facility] 10/20/2015, 6:08 PM  LOS: 1 day

## 2015-10-20 NOTE — Progress Notes (Signed)
Pt's sputum was inconclusive-will need to recollect. Will pass on to day RN

## 2015-10-20 NOTE — Progress Notes (Signed)
Pt has questions about the reason why he was ordered magnesium. Pt's Mg level was last drawn on 10/15/15 with a value of 2.1. Upon reading the MD's note, the aim was to keep pt's Mg level at over 2. There was no prior Mg lab draw on the 10th or 11th to verify the value of MG. On call NP, Schorr was notified. She advised to hold med until another Mg lab was drawn in the AM  To justify the need for the magnesium IV. She said to hold the Mg IV because there is no lab value to justify the administration of the medication. Will look out for the pt AM Mg lab value.Pt doing well. Will continue to monitor.

## 2015-10-21 ENCOUNTER — Inpatient Hospital Stay (HOSPITAL_COMMUNITY): Payer: Medicare Other

## 2015-10-21 DIAGNOSIS — R0602 Shortness of breath: Secondary | ICD-10-CM

## 2015-10-21 DIAGNOSIS — R0902 Hypoxemia: Secondary | ICD-10-CM

## 2015-10-21 DIAGNOSIS — I5032 Chronic diastolic (congestive) heart failure: Secondary | ICD-10-CM

## 2015-10-21 LAB — BASIC METABOLIC PANEL
Anion gap: 8 (ref 5–15)
BUN: 21 mg/dL — ABNORMAL HIGH (ref 6–20)
CHLORIDE: 104 mmol/L (ref 101–111)
CO2: 21 mmol/L — AB (ref 22–32)
CREATININE: 1.16 mg/dL (ref 0.61–1.24)
Calcium: 8.8 mg/dL — ABNORMAL LOW (ref 8.9–10.3)
GFR calc non Af Amer: 56 mL/min — ABNORMAL LOW (ref >60)
Glucose, Bld: 179 mg/dL — ABNORMAL HIGH (ref 65–99)
Potassium: 4.2 mmol/L (ref 3.5–5.1)
Sodium: 133 mmol/L — ABNORMAL LOW (ref 135–145)

## 2015-10-21 LAB — MAGNESIUM: Magnesium: 1.9 mg/dL (ref 1.7–2.4)

## 2015-10-21 LAB — CBC
HEMATOCRIT: 37.3 % — AB (ref 39.0–52.0)
Hemoglobin: 11.8 g/dL — ABNORMAL LOW (ref 13.0–17.0)
MCH: 29.7 pg (ref 26.0–34.0)
MCHC: 31.6 g/dL (ref 30.0–36.0)
MCV: 94 fL (ref 78.0–100.0)
Platelets: 167 10*3/uL (ref 150–400)
RBC: 3.97 MIL/uL — AB (ref 4.22–5.81)
RDW: 13.3 % (ref 11.5–15.5)
WBC: 10.6 10*3/uL — AB (ref 4.0–10.5)

## 2015-10-21 MED ORDER — AMOXICILLIN-POT CLAVULANATE 875-125 MG PO TABS: 1.0000 | ORAL_TABLET | Freq: Two times a day (BID) | ORAL | 0 refills | Status: DC

## 2015-10-21 MED ORDER — GUAIFENESIN ER 600 MG PO TB12: 600.0000 mg | ORAL_TABLET | Freq: Two times a day (BID) | ORAL | 0 refills | Status: DC

## 2015-10-21 MED ORDER — MAGNESIUM OXIDE 400 MG PO TABS: 400.0000 mg | ORAL_TABLET | Freq: Every day | ORAL | 0 refills | Status: AC

## 2015-10-21 NOTE — Discharge Summary (Signed)
Physician Discharge Summary  [name], MD [id number] DOB: 1929/08/07 DOA: 10/19/2015  PCP: [name], MD  Admit date: 10/19/2015 Discharge date: 10/21/2015  Time spent: 35 minutes  Recommendations for Outpatient Follow-up:  1. Repeat  BMET and Mg level to follow electrolytes  2. Patient will benefit of PFT's and follow up with pulmonary service at discharge (scheduled already) 3. Please repeat CXR in 3-4 weeks to assure resolution of infiltrates    Discharge Diagnoses:  Principal Problem:   CAP (community acquired pneumonia) Active Problems:   Essential hypertension   Asthma   GERD   Diastolic dysfunction   Paroxysmal atrial fibrillation (HCC)   Hypoxia   SOB (shortness of breath)   Chronic diastolic heart failure ([place])   Discharge Condition: stable and improved. Discharge home with instructions to follow up with PCP in 2 weeks. Also will follow up with pulmonary service on 10/23/15  Diet recommendation: heart healthy diet   Filed Weights   10/19/15 1248  Weight: 70.1 kg (154 lb 8 oz)    History of present illness:  82 y.o.malewith a medical history significant for, but not limited to, CAD, CVA, HTN and PAF. Patient presented to the emergency department this morning with a one-week history of cough. Patient has asthma, and hearing himself wheezing. Cough nonproductive. No chest pain. No history of pneumonia. No recent hospitalizations. No fevers. Patient states that he feels "dry". Urinating normal.   Hospital Course:  Acute respiratory failure with hypoxia secondary to LLL CAP. -will follow PNA order set protocol -Follow up on culture data, so far no growth  -repeat CXR showing improvement in aeration -patient is afebrile, WBC's essentially WNL at discharge -will send home on Augmentin to complete antibiotic therapy  -continue mucinex and flutter valve   Asthma. -no wheezing heard on exam -continue home inhaler regimen  -no needs for  steroids currently  -PFT's and follow up with pulmonary service at discharge  PAF -currently on A. fib -Chadsvasc 6-7. -Continue lopressor,Tikosyn and Eliquis -will recommend follow up of BMET and Magnesium level at discharge -electrolytes were repleted during this admission. Goal is potassium > 4 and magnesium of 2.0  History of CVA. Patient had TIA several months ago.  -On Eliquis for secondary stroke prevention.  -continue outpatient follow up with neurology at discharge  History of Hypertension -continue home meds -prn hydralazine  CAD, remote stenting.  -No chest pain -neg troponin   Chronic diastolic heart failure, grade 2. Echo in 7/16 showed normal LV systolic function, EF 123456 with moderate diastolic dysfunction.  -compensated currently -no crackles -continue daily weights and follow low sodium diet   Hyperlipidemia.  -Continue home statin  GERD. . -Continue home PPI  Depression, stable -Continue home Wellbutrin   Procedures:  See below for x-ray reports   Consultations:  None   Discussed with Dr. Chase Caller over the phone (looking to follow patient for his asthma/COPD at discharge)  Discharge Exam: Vitals:   10/20/15 2123 10/21/15 0514  BP: (!) 104/57 132/62  Pulse: 75 63  Resp: 18 18  Temp: 98.1 F (36.7 C) 97.9 F (36.6 C)    General:  feeling much better and breathing a lot easier. No needs for oxygen supplementation. Reports no palpitations today. No fever.  Cardiovascular: rate controlled, no rubs, no gallops, no murmurs   Respiratory: scattered rhonchi. No frank wheezing, improved air movement, no use of accessory muscles.  Abdomen: soft, NT, ND, positive BS  Musculoskeletal: no edema, no cyanosis, no clubbing  Discharge Instructions   Discharge Instructions    Diet - low sodium heart healthy    Complete by:  As directed   Discharge instructions    Complete by:  As directed   Keep yourself well hydrated Take  medications as prescribed Please follow up with pulmonary service as scheduled  Follow up with PCP in 2 weeks     Current Discharge Medication List    START taking these medications   Details  amoxicillin-clavulanate (AUGMENTIN) 875-125 MG tablet Take 1 tablet by mouth 2 (two) times daily. Qty: 12 tablet, Refills: 0    guaiFENesin (MUCINEX) 600 MG 12 hr tablet Take 1 tablet (600 mg total) by mouth 2 (two) times daily. Qty: 30 tablet, Refills: 0    magnesium oxide (MAG-OX) 400 MG tablet Take 1 tablet (400 mg total) by mouth daily. Qty: 3 tablet, Refills: 0      CONTINUE these medications which have NOT CHANGED   Details  apixaban (ELIQUIS) 5 MG TABS tablet Take 1 tablet (5 mg total) by mouth 2 (two) times daily. Qty: 60 tablet, Refills: 6    atenolol (TENORMIN) 25 MG tablet Take 1 tablet (25 mg total) by mouth 2 (two) times daily. Qty: 180 tablet, Refills: 3   Associated Diagnoses: Essential hypertension    atorvastatin (LIPITOR) 20 MG tablet Take 1 tablet (20 mg total) by mouth daily. Qty: 90 tablet, Refills: 3    beclomethasone (BECONASE-AQ) 42 MCG/SPRAY nasal spray Place 1 spray into both nostrils 2 (two) times daily. Dose is for each nostril.    buPROPion (WELLBUTRIN SR) 150 MG 12 hr tablet Take 1 tablet (150 mg total) by mouth daily. Qty: 100 tablet, Refills: 4    cetirizine (ZYRTEC) 10 MG tablet Take 10 mg by mouth daily with supper.     Cholecalciferol (VITAMIN D) 2000 units tablet Take 2,000 Units by mouth daily.    dofetilide (TIKOSYN) 250 MCG capsule Take 1 capsule (250 mcg total) by mouth 2 (two) times daily. Qty: 14 capsule, Refills: 0    Fluticasone Furoate (ARNUITY ELLIPTA) 200 MCG/ACT AEPB Inhale 1 puff into the lungs daily.    furosemide (LASIX) 20 MG tablet Take 20 mg by mouth daily as needed (ankle swelling).    Associated Diagnoses: Diastolic dysfunction; Paroxysmal atrial fibrillation (HCC)    levalbuterol (XOPENEX HFA) 45 MCG/ACT inhaler Inhale 1  puff into the lungs every 4 (four) hours as needed for wheezing or shortness of breath.    nitroGLYCERIN (NITROSTAT) 0.4 MG SL tablet Place 1 tablet (0.4 mg total) under the tongue every 5 (five) minutes x 3 doses as needed for chest pain. Qty: 25 tablet, Refills: 2    Omega-3 Fatty Acids (FISH OIL) 500 MG CAPS Take 500 mg by mouth daily.    omeprazole (PRILOSEC OTC) 20 MG tablet Take 1 tablet (20 mg total) by mouth daily. Qty: 100 tablet, Refills: 4   Associated Diagnoses: Gastroesophageal reflux disease, esophagitis presence not specified    tamsulosin (FLOMAX) 0.4 MG CAPS capsule TAKE ONE CAPSULE BY MOUTH DAILY Qty: 100 capsule, Refills: 4   Associated Diagnoses: Asthma with bronchitis, unspecified asthma severity, uncomplicated; BPH associated with nocturia    PROAIR HFA 108 (90 Base) MCG/ACT inhaler INHALE 2 PUFFS INTO THE LUNGS EVERY 6 HOURS AS NEEDED FOR WHEEZING Qty: 8.5 g, Refills: 5      STOP taking these medications     fluticasone (FLONASE) 50 MCG/ACT nasal spray        Allergies  Allergen  Reactions  . Advil [Ibuprofen] Nausea Only and Rash  . Asa [Aspirin] Rash  . Tylenol [Acetaminophen] Rash   Follow-up Information    TODD,JEFFREY ALLEN, MD. Schedule an appointment as soon as possible for a visit in 2 week(s).   Specialty:  Family Medicine Contact information: [place] [address] [phone]        [facility], MD Follow up on 10/23/2015.   Specialty:  Pulmonary Disease Contact information: [place] [address] [phone]           The results of significant diagnostics from this hospitalization (including imaging, microbiology, ancillary and laboratory) are listed below for reference.    Significant Diagnostic Studies: Dg Chest 2 View  Result Date: 10/21/2015 CLINICAL DATA:  Shortness of breath since this past Sunday. EXAM: CHEST  2 VIEW COMPARISON:  PA and lateral chest x-ray of October 19, 2015  and Jun 24, 2014. FINDINGS: The lungs are well-expanded. There is persistent left lower lobe infiltrate. It is slightly less conspicuous on the lateral view today. There is no significant pleural effusion. The heart and pulmonary vascularity are normal. There is calcification in the wall of the aortic arch. The bony thorax exhibits no acute abnormality. IMPRESSION: Slightly decreased conspicuity of left lower lobe pneumonia. Followup PA and lateral chest X-ray is recommended in 3-4 weeks following trial of antibiotic therapy to ensure resolution and exclude underlying malignancy. Aortic atherosclerosis. Electronically Signed   By: David  Martinique M.D.   On: 10/21/2015 08:07   Dg Chest 2 View  Result Date: 10/19/2015 CLINICAL DATA:  Shortness of breath and wheezing last night and all morning. EXAM: CHEST  2 VIEW COMPARISON:  06/24/2014. FINDINGS: There is new patchy airspace disease in the posterior left lung base, suspicious for pneumonia. Right lung is clear. The cardiopericardial silhouette is within normal limits for size. The visualized bony structures of the thorax are intact. IMPRESSION: Left lower lobe pneumonia. Consider follow-up imaging to ensure resolution. Electronically Signed   By: Misty Stanley M.D.   On: 10/19/2015 09:40    Microbiology: Recent Results (from the past 240 hour(s))  Culture, blood (routine x 2) Call MD if unable to obtain prior to antibiotics being given     Status: None (Preliminary result)   Collection Time: 10/19/15  8:59 AM  Result Value Ref Range Status   Specimen Description BLOOD LEFT ANTECUBITAL  Final   Special Requests BOTTLES DRAWN AEROBIC AND ANAEROBIC 5CC  Final   Culture NO GROWTH 1 DAY  Final   Report Status PENDING  Incomplete  Culture, sputum-assessment     Status: None   Collection Time: 10/19/15 11:08 AM  Result Value Ref Range Status   Specimen Description SPUTUM  Final   Special Requests NONE  Final   Sputum evaluation   Final    MICROSCOPIC  FINDINGS SUGGEST THAT THIS SPECIMEN IS NOT REPRESENTATIVE OF LOWER RESPIRATORY SECRETIONS. PLEASE RECOLLECT. Gram Stain Report Called to,Read Back By and Verified With: D GARDNER,RN @0614  10/1115 MKELLY    Report Status 10/20/2015 FINAL  Final  Culture, blood (routine x 2) Call MD if unable to obtain prior to antibiotics being given     Status: None (Preliminary result)   Collection Time: 10/19/15 12:04 PM  Result Value Ref Range Status   Specimen Description BLOOD RIGHT FOREARM  Final   Special Requests BOTTLES DRAWN AEROBIC AND ANAEROBIC 5CC  Final   Culture NO GROWTH 1 DAY  Final   Report Status  PENDING  Incomplete  Culture, expectorated sputum-assessment     Status: None   Collection Time: 10/20/15  4:59 PM  Result Value Ref Range Status   Specimen Description SPUTUM  Final   Special Requests NONE  Final   Sputum evaluation   Final    THIS SPECIMEN IS ACCEPTABLE. RESPIRATORY CULTURE REPORT TO FOLLOW.   Report Status 10/20/2015 FINAL  Final  Culture, respiratory (NON-Expectorated)     Status: None (Preliminary result)   Collection Time: 10/20/15  4:59 PM  Result Value Ref Range Status   Specimen Description EXPECTORATED SPUTUM  Final   Special Requests NONE  Final   Gram Stain   Final    MODERATE WBC PRESENT,BOTH PMN AND MONONUCLEAR FEW GRAM POSITIVE RODS    Culture NO GROWTH < 24 HOURS  Final   Report Status PENDING  Incomplete     Labs: Basic Metabolic Panel:  Recent Labs Lab 10/15/15 0900 10/19/15 0859 10/20/15 0401 10/21/15 1035  NA 136 137 135 133*  K 4.5 4.0 3.6 4.2  CL 104 103 102 104  CO2 26 25 25  21*  GLUCOSE 155* 133* 90 179*  BUN 19 16 19  21*  CREATININE 1.22 1.21 1.21 1.16  CALCIUM 9.2 9.3 8.4* 8.8*  MG 2.1  --   --  1.9   CBC:  Recent Labs Lab 10/19/15 0859 10/20/15 0401 10/21/15 1035  WBC 15.2* 14.9* 10.6*  HGB 14.6 12.0* 11.8*  HCT 44.6 38.4* 37.3*  MCV 94.3 94.3 94.0  PLT 173 149* 167    BNP (last 3 results)  Recent Labs   12/24/14 1710 10/19/15 0910  BNP 102.8* 145.7*    ProBNP (last 3 results) No results for input(s): PROBNP in the last 8760 hours.  CBG: No results for input(s): GLUCAP in the last 168 hours.   Signed:  Barton Dubois MD.  [facility] 10/21/2015, 1:56 PM

## 2015-10-21 NOTE — Care Management Note (Signed)
Case Management Note  Patient Details  Name: AVERELL BRINTNALL, MD MRN: [id number] Date of Birth: 1929/09/24  Subjective/Objective:         Admitted with CAP, hx of  CAD, CVA, HTN and PAF.From home with wife, independent with ADL's , no DME usage.     PCP: Grier Rocher  Action/Plan:  Plan is to d/c today to home.  Expected Discharge Date:   10/21/2015          Expected Discharge Plan:  Home/Self Care  In-House Referral:     Discharge planning Services  CM Consult     Status of Service:  Completed, signed off  If discussed at [place] of Stay Meetings, dates discussed:    Additional Comments:  Sharin Mons, RN 10/21/2015, 11:32 AM

## 2015-10-21 NOTE — Telephone Encounter (Signed)
lmtcb for pt. Will need to see if pt can come in on 10/23/15 at 2pm (appt already scheduled).

## 2015-10-21 NOTE — Progress Notes (Signed)
Nsg Discharge Note  Admit Date:  10/19/2015 Discharge date: 10/21/2015   [name], MD to be D/C'd Home per MD order.  AVS completed.  Copy for chart, and copy for patient signed, and dated. Patient/caregiver able to verbalize understanding.  Discharge Medication:   Medication List    STOP taking these medications   fluticasone 50 MCG/ACT nasal spray Commonly known as:  FLONASE     TAKE these medications   amoxicillin-clavulanate 875-125 MG tablet Commonly known as:  AUGMENTIN Take 1 tablet by mouth 2 (two) times daily.   apixaban 5 MG Tabs tablet Commonly known as:  ELIQUIS Take 1 tablet (5 mg total) by mouth 2 (two) times daily.   ARNUITY ELLIPTA 200 MCG/ACT Aepb Generic drug:  Fluticasone Furoate Inhale 1 puff into the lungs daily.   atenolol 25 MG tablet Commonly known as:  TENORMIN Take 1 tablet (25 mg total) by mouth 2 (two) times daily.   atorvastatin 20 MG tablet Commonly known as:  LIPITOR Take 1 tablet (20 mg total) by mouth daily. What changed:  when to take this   beclomethasone 42 MCG/SPRAY nasal spray Commonly known as:  BECONASE-AQ Place 1 spray into both nostrils 2 (two) times daily. Dose is for each nostril.   buPROPion 150 MG 12 hr tablet Commonly known as:  WELLBUTRIN SR Take 1 tablet (150 mg total) by mouth daily.   cetirizine 10 MG tablet Commonly known as:  ZYRTEC Take 10 mg by mouth daily with supper.   dofetilide 250 MCG capsule Commonly known as:  TIKOSYN Take 1 capsule (250 mcg total) by mouth 2 (two) times daily.   Fish Oil 500 MG Caps Take 500 mg by mouth daily.   guaiFENesin 600 MG 12 hr tablet Commonly known as:  MUCINEX Take 1 tablet (600 mg total) by mouth 2 (two) times daily.   LASIX 20 MG tablet Generic drug:  furosemide Take 20 mg by mouth daily as needed (ankle swelling).   levalbuterol 45 MCG/ACT inhaler Commonly known as:  XOPENEX HFA Inhale 1 puff into the lungs every 4 (four) hours as needed for wheezing or  shortness of breath.   magnesium oxide 400 MG tablet Commonly known as:  MAG-OX Take 1 tablet (400 mg total) by mouth daily.   nitroGLYCERIN 0.4 MG SL tablet Commonly known as:  NITROSTAT Place 1 tablet (0.4 mg total) under the tongue every 5 (five) minutes x 3 doses as needed for chest pain.   omeprazole 20 MG tablet Commonly known as:  PRILOSEC OTC Take 1 tablet (20 mg total) by mouth daily. What changed:  when to take this   PROAIR HFA 108 (90 Base) MCG/ACT inhaler Generic drug:  albuterol INHALE 2 PUFFS INTO THE LUNGS EVERY 6 HOURS AS NEEDED FOR WHEEZING   tamsulosin 0.4 MG Caps capsule Commonly known as:  FLOMAX TAKE ONE CAPSULE BY MOUTH DAILY What changed:  how much to take  how to take this  when to take this  additional instructions   Vitamin D 2000 units tablet Take 2,000 Units by mouth daily.       Discharge Assessment: Vitals:   10/20/15 2123 10/21/15 0514  BP: (!) 104/57 132/62  Pulse: 75 63  Resp: 18 18  Temp: 98.1 F (36.7 C) 97.9 F (36.6 C)   Skin clean, dry and intact without evidence of skin break down, no evidence of skin tears noted. IV catheter discontinued intact. Site without signs and symptoms of complications - no redness  or edema noted at insertion site, patient denies c/o pain - only slight tenderness at site.  Dressing with slight pressure applied.  D/c Instructions-Education: Discharge instructions given to patient/family with verbalized understanding. D/c education completed with patient/family including follow up instructions, medication list, d/c activities limitations if indicated, with other d/c instructions as indicated by MD - patient able to verbalize understanding, all questions fully answered. Patient instructed to return to ED, call 911, or call MD for any changes in condition.  Patient escorted via [facility], and D/C home via private auto.  Dimas Chyle, RN 10/21/2015 2:49 PM

## 2015-10-22 ENCOUNTER — Ambulatory Visit (INDEPENDENT_AMBULATORY_CARE_PROVIDER_SITE_OTHER): Payer: Medicare Other | Admitting: Podiatry

## 2015-10-22 DIAGNOSIS — M25571 Pain in right ankle and joints of right foot: Secondary | ICD-10-CM

## 2015-10-22 DIAGNOSIS — M76821 Posterior tibial tendinitis, right leg: Secondary | ICD-10-CM

## 2015-10-22 NOTE — Patient Instructions (Signed)

## 2015-10-22 NOTE — Telephone Encounter (Signed)
Patient called back and states he will come to the appt scheduled for 10/23/15 @ 2pm -pr

## 2015-10-23 ENCOUNTER — Telehealth: Payer: Self-pay | Admitting: Internal Medicine

## 2015-10-23 ENCOUNTER — Ambulatory Visit (INDEPENDENT_AMBULATORY_CARE_PROVIDER_SITE_OTHER): Payer: Medicare Other | Admitting: Internal Medicine

## 2015-10-23 ENCOUNTER — Encounter: Payer: Self-pay | Admitting: Internal Medicine

## 2015-10-23 VITALS — BP 124/66 | HR 59 | Ht 70.5 in | Wt 157.8 lb

## 2015-10-23 DIAGNOSIS — Z8709 Personal history of other diseases of the respiratory system: Secondary | ICD-10-CM

## 2015-10-23 DIAGNOSIS — Z8701 Personal history of pneumonia (recurrent): Secondary | ICD-10-CM

## 2015-10-23 LAB — CULTURE, RESPIRATORY: CULTURE: NORMAL

## 2015-10-23 LAB — CULTURE, RESPIRATORY W GRAM STAIN

## 2015-10-23 NOTE — Telephone Encounter (Signed)
MR, you routed this to triage. Please advise.

## 2015-10-23 NOTE — Progress Notes (Signed)
Subjective:     Patient ID: [name] Spence, MD, male   DOB: [birth year], 82 y.o.   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 Followed in past for asthma w/ bronchitis. LOV around 2008. Medical hx CAD/ MI/CABG, AFib/ warfarin, diastolic dysfunction, GERD He did well for several years with help from Dr Sherren Mocha. Starting about 6 months ago with no obvious trigger, he began having increased wheeze, cough, needing SABA 2-3x/ day and Qvar only intermittently. He has been worse this Spring, blaming pollen. Sputum remains clear. Denies fever, nodes, blood, edema, chest pain. Hx  Intermittent AFib- no pacemaker Had BCG in medical school.         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 Followed in past for asthma w/ bronchitis.  Medical hx CAD/ MI/CABG,P AFib/ warfarin, diastolic dysfunction, GERD. Dr Coralie Keens alerted [place] that pt was having more trouble, so we could work him in today. Follows For: Pt c/o wheezing and SOB, prod cough with thick yellow mucus. States he had asthma flare up last night. Has has had 3 episodes of A-fib in last week, most recent was 06/18/14.  Persistent variable chest rattle has been going on almost since he was last seen here. Using albuterol rescue inhaler once or twice daily. He has caught a cold from his wife and overnight began or persistent wheeze, using rescue inhaler 4 times daily and continuing his Qvar. Transient relief only. Sputum trace yellow. No fever. He has a little left over prednisone but reports that 20 mg of prednisone daily is enough of a stimulant to trigger his atrial fib.  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  Medical hx CAD/ MI/CABG,P AFib/ warfarin, diastolic dysfunction, GERD Follow For: Pt  doing well since last visit. Still post nasal drip. Denies wheezing, SOB, Prod cough.  Has not needed rescue inhaler in months and denies wheeze since a cold in early spring. Using Qvar twice daily and Singulair. Notices some hoarseness, mouth breathing  Told he has some pulmonary hypertension. We discussed possible role of oxygen desaturation. Does not recognize symptoms of sleep apnea. CXR 06/24/14 IMPRESSION: No active cardiopulmonary disease. Electronically Signed  By: Franchot Gallo M.D.  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 Medical history CAD/MI/CABG, P A. fib/warfarin, diastolic dysfunction, GERD FOLLOWS FOR: Pt states he has had very little SOB or wheezing since last OV. Has had trouble with Afib. He has continued Flovent 110 and pro air rescue inhaler twice daily each. Has not recognized any association of atrial fib or palpitations with use of albuterol. [name] last year with no particular benefit recognized over his current regimen. He asks about persistent hoarseness. Overnight Oximetry 09/30/2014-normal, not qualifying for sleep O2   OV 10/23/2015  Chief Complaint  Patient presents with  Patient presents with  . Hospitalization Follow-up    Pt recently hospitalized for LLL CAP. Pt c/o prod cough with green, blood streaked mucus.  Pt discharged yesterday.     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 In June 2017 he had worsening asthma symptoms and he saw Dr. Hardie Pulley and allergy. According to his history exhaled nitric oxide at this visit was elevated at 70 ppb. He was then started on inhaled corticosteroid ARNUITY . Marland Kitchen Up until that point he was only taking her steroids as  needed. After that he started taking her steroids regular basis. At follow-up his exhaled nitric oxide at dropped 30s. He maintain himself on the inhaled steroid. Apparently blood allergy panel was negative. He was then leading his baseline life with  control of atrial fibrillation. However on 10/19/2015 he got hospitalized for left lower lobe pneumonia. Personally visualized the chest x-ray review the records and agree with the findings. It sounds like he had bacterial pneumonia not otherwise specified. He was discharged yesterday. He still on Augmentin. He has postnasal drainage but is not taking any nasal steroids. He has nocturnal acid reflux and is on fish oil. He is feeling back to baseline other than some post pneumonia fatigue. He also feels his atrial fibrillation is somewhat out of control. He has some occasional streaky hemoptysis but this is improving.     has a past medical history of Allergic rhinitis; Anxiety; Asthma; Atrial fibrillation ([name]); CAD (coronary artery disease); Depression; Diverticulosis (03/16/1995); DJD (degenerative joint disease); GERD (gastroesophageal reflux disease) (12/30/2000); Heart attack ([place]) (1984); Hiatal hernia (12/30/2000); History of blood transfusion (1988); History of duodenal ulcer (10/15/1986); Hyperlipidemia; Hypertension; Sinus bradycardia; and Stroke ([place]).   reports that he has never smoked. He has never used smokeless tobacco.  Past Surgical History:  Procedure Laterality Date  . CARDIAC CATHETERIZATION    . CATARACT EXTRACTION W/ INTRAOCULAR LENS  IMPLANT, BILATERAL Bilateral ?2013  . CHOLECYSTECTOMY OPEN  1989  . CORONARY ANGIOPLASTY WITH STENT PLACEMENT  2010  . hospital ccu  1984   heart attack, hypoplastic right coronary  . INGUINAL HERNIA REPAIR Right ~ 1990  . NASAL SEPTUM SURGERY  1972  . TONSILLECTOMY    . TOTAL KNEE ARTHROPLASTY Bilateral 1998-2004   "right-left"  . VAGOTOMY AND PYLOROPLASTY  1988   "bleeding duodenal ulcer"    Allergies  Allergen Reactions  . Advil [Ibuprofen] Nausea Only and Rash  . Asa [Aspirin] Rash  . Tylenol [Acetaminophen] Rash    Immunization History  Administered Date(s) Administered  . Influenza Split 10/23/2012, 10/02/2015  . Influenza  Whole 10/27/2007, 11/13/2008, 10/29/2009, 11/09/2010  . Influenza-Unspecified 12/09/2013, 10/28/2014  . Pneumococcal Conjugate-13 09/17/2013  . Pneumococcal Polysaccharide-23 08/30/2000, 11/29/2005  . Td 06/20/2007  . Zoster 06/20/2007    Family History  Problem Relation Age of Onset  . Coronary artery disease Mother     deceased  . Deep vein thrombosis Sister   . Coronary artery disease Brother   . Asthma Brother   . Colon cancer Neg Hx      Current Outpatient Prescriptions:  .  amoxicillin-clavulanate (AUGMENTIN) 875-125 MG tablet, Take 1 tablet by mouth 2 (two) times daily., Disp: 12 tablet, Rfl: 0 .  apixaban (ELIQUIS) 5 MG TABS tablet, Take 1 tablet (5 mg total) by mouth 2 (two) times daily., Disp: 60 tablet, Rfl: 6 .  atenolol (TENORMIN) 25 MG tablet, Take 1 tablet (25 mg total) by mouth 2 (two) times daily., Disp: 180 tablet, Rfl: 3 .  atorvastatin (LIPITOR) 20 MG tablet, Take 1 tablet (20 mg total) by mouth daily. (Patient taking differently: Take 20 mg by mouth daily after supper. ), Disp: 90 tablet, Rfl: 3 .  beclomethasone (BECONASE-AQ) 42 MCG/SPRAY nasal spray, Place 1 spray into both nostrils 2 (two) times daily. Dose is for each nostril., Disp: , Rfl:  .  buPROPion (WELLBUTRIN SR) 150 MG 12 hr tablet, Take 1 tablet (150 mg total) by mouth daily., Disp: 100 tablet, Rfl: 4 .  cetirizine (ZYRTEC) 10 MG tablet, Take 10  mg by mouth daily with supper. , Disp: , Rfl:  .  Cholecalciferol (VITAMIN D) 2000 units tablet, Take 2,000 Units by mouth daily., Disp: , Rfl:  .  dofetilide (TIKOSYN) 250 MCG capsule, Take 1 capsule (250 mcg total) by mouth 2 (two) times daily., Disp: 14 capsule, Rfl: 0 .  Fluticasone Furoate (ARNUITY ELLIPTA) 200 MCG/ACT AEPB, Inhale 1 puff into the lungs daily., Disp: , Rfl:  .  furosemide (LASIX) 20 MG tablet, Take 20 mg by mouth daily as needed (ankle swelling). , Disp: , Rfl:  .  guaiFENesin (MUCINEX) 600 MG 12 hr tablet, Take 1 tablet (600 mg total) by  mouth 2 (two) times daily., Disp: 30 tablet, Rfl: 0 .  levalbuterol (XOPENEX HFA) 45 MCG/ACT inhaler, Inhale 1 puff into the lungs every 4 (four) hours as needed for wheezing or shortness of breath., Disp: , Rfl:  .  magnesium oxide (MAG-OX) 400 MG tablet, Take 1 tablet (400 mg total) by mouth daily., Disp: 3 tablet, Rfl: 0 .  nitroGLYCERIN (NITROSTAT) 0.4 MG SL tablet, Place 1 tablet (0.4 mg total) under the tongue every 5 (five) minutes x 3 doses as needed for chest pain., Disp: 25 tablet, Rfl: 2 .  Omega-3 Fatty Acids (FISH OIL) 500 MG CAPS, Take 500 mg by mouth daily., Disp: , Rfl:  .  omeprazole (PRILOSEC OTC) 20 MG tablet, Take 1 tablet (20 mg total) by mouth daily. (Patient taking differently: Take 20 mg by mouth daily with supper. ), Disp: 100 tablet, Rfl: 4 .  PROAIR HFA 108 (90 Base) MCG/ACT inhaler, INHALE 2 PUFFS INTO THE LUNGS EVERY 6 HOURS AS NEEDED FOR WHEEZING, Disp: 8.5 g, Rfl: 5 .  tamsulosin (FLOMAX) 0.4 MG CAPS capsule, TAKE ONE CAPSULE BY MOUTH DAILY (Patient taking differently: Take 0.4 mg by mouth daily after supper. ), Disp: 100 capsule, Rfl: 4    Review of Systems     Objective:   Physical Exam  Constitutional: He is oriented to person, place, and time. He appears well-nourished. No distress.  Body mass index is 22.32 kg/m.   HENT:  Head: Normocephalic and atraumatic.  Right Ear: External ear normal.  Left Ear: External ear normal.  Mouth/Throat: Oropharynx is clear and moist. No oropharyngeal exudate.  Eyes: Conjunctivae and EOM are normal. Pupils are equal, round, and reactive to light. Right eye exhibits no discharge. Left eye exhibits no discharge. No scleral icterus.  Neck: Normal range of motion. Neck supple. No JVD present. No tracheal deviation present. No thyromegaly present.  Cardiovascular: Normal rate, regular rhythm and intact distal pulses.  Exam reveals no gallop and no friction rub.   No murmur heard. Pulmonary/Chest: Effort normal and breath  sounds normal. No respiratory distress. He has no wheezes. He has no rales. He exhibits no tenderness.  Scattered crackles especially in the left base  Abdominal: Soft. Bowel sounds are normal. He exhibits no distension and no mass. There is no tenderness. There is no rebound and no guarding.  Musculoskeletal: Normal range of motion. He exhibits no edema or tenderness.  Lymphadenopathy:    He has no cervical adenopathy.  Neurological: He is alert and oriented to person, place, and time. He has normal reflexes. No cranial nerve deficit. Coordination normal.  Skin: Skin is warm and dry. No rash noted. He is not diaphoretic. No erythema. No pallor.  Psychiatric: He has a normal mood and affect. His behavior is normal. Judgment and thought content normal.  Nursing note and vitals reviewed.  Vitals:   10/23/15 1401  BP: 124/66  Pulse: (!) 59  SpO2: 94%  Weight: 157 lb 12.8 oz (71.6 kg)  Height: 5' 10.5" (1.791 m)        Assessment:       ICD-9-CM ICD-10-CM   1. History of pneumonia V12.61 Z87.01   2. History of asthma V12.69 Z87.09        Plan:      Glad you're better from your recent pneumonia I suspect inhale steroid increase the risk for pneumonia but you absolutely needed because of asthma  Plan = I will inform Dr. Aundra Dubin about your atrial fibrillation = Complete Augmentin course - Please restart nasal steroid - Discontinue fish oil - Continue inhaled steroids Arnuity - Use albuterol as needed - Monitor your hemoptysis  Follow-up - 5-6 weeks with a chest x-ray 2 view and then with Dr. Chase Caller on nurse practitioner - Exhaled nitric oxide at follow-up  Depending on these results we can look at reducing inhaled steroid dose or getting a CT scan of the chest -   Dr. Brand Males, M.D., [facility].C.P Pulmonary and Critical Care Medicine Staff Physician [place] [facility] Pager: [phone], If no answer or between  15:00h -  7:00h: call [phone]  10/23/2015 2:32 PM

## 2015-10-23 NOTE — Telephone Encounter (Signed)
[name] Conn, MD has fu with you soon. rEcently admitted with CAP. Now says A fib somewhat out of control  Thanks  Dr. Brand Males, M.D., [facility].C.P Pulmonary and Critical Care Medicine Staff Physician [place] [facility] Pager: [phone], If no answer or between  15:00h - 7:00h: call [phone]  10/23/2015 2:37 PM

## 2015-10-23 NOTE — Progress Notes (Signed)
Subjective:     Patient ID: [name] Spence, MD, male   DOB: 29-Mar-1929, 82 y.o.   MRN: [id number]  HPI patient presents stating my right foot has quite a bit of collapse of the arch and it's feeling good but it still can get painful   Review of Systems     Objective:   Physical Exam Neurovascular status unchanged with discomfort in the posterior tibial tendon area right over left of a mild to moderate nature and much improved from previous    Assessment:     Tendinitis improving but present    Plan:     Advised on physical therapy and dispensed orthotics with instructions on usage. Reappoint for [place] to recheck

## 2015-10-23 NOTE — Patient Instructions (Signed)
ICD-9-CM ICD-10-CM   1. History of pneumonia V12.61 Z87.01   2. History of asthma V12.69 Z87.09     Glad you're better from your recent pneumonia I suspect inhale steroid increase the risk for pneumonia but you absolutely needed because of asthma  Plan = I will inform Dr. Aundra Dubin about your atrial fibrillation = Complete Augmentin course - Please restart nasal steroid - Discontinue fish oil - Continue inhaled steroids Arnuity - Use albuterol as needed - Monitor your hemoptysis  Follow-up - 5-6 weeks with a chest x-ray 2 view and then with Dr. Chase Caller on nurse practitioner - Exhaled nitric oxide at follow-up  Depending on these results we can look at reducing inhaled steroid dose or getting a CT scan of the chest -

## 2015-10-24 LAB — CULTURE, BLOOD (ROUTINE X 2)
Culture: NO GROWTH
Culture: NO GROWTH

## 2015-10-24 NOTE — Telephone Encounter (Signed)
Nira Conn, can I see Yianni Vroman as a work-in next week?

## 2015-10-27 ENCOUNTER — Encounter: Payer: Self-pay | Admitting: Cardiology

## 2015-10-27 ENCOUNTER — Ambulatory Visit (INDEPENDENT_AMBULATORY_CARE_PROVIDER_SITE_OTHER): Payer: Medicare Other | Admitting: Cardiology

## 2015-10-27 VITALS — BP 120/62 | HR 80 | Ht 70.5 in | Wt 153.0 lb

## 2015-10-27 DIAGNOSIS — I519 Heart disease, unspecified: Secondary | ICD-10-CM | POA: Diagnosis not present

## 2015-10-27 DIAGNOSIS — I480 Paroxysmal atrial fibrillation: Secondary | ICD-10-CM

## 2015-10-27 DIAGNOSIS — I5189 Other ill-defined heart diseases: Secondary | ICD-10-CM

## 2015-10-27 NOTE — Patient Instructions (Signed)
Medication Instructions:  Your physician recommends that you continue on your current medications as directed. Please refer to the Current Medication list given to you today.   Labwork: None   Testing/Procedures: None   Follow-Up: Your physician wants you to follow-up in: 6 months with Dr Aundra Dubin in the [facility] at [facility].  You will receive a reminder letter in the mail two months in advance. If you don't receive a letter, please call our office to schedule the follow-up appointment.        If you need a refill on your cardiac medications before your next appointment, please call your pharmacy.

## 2015-10-27 NOTE — Progress Notes (Signed)
Patient ID: [name] Spence, MD, male   DOB: 12/09/1929, 82 y.o.   MRN: [id number] PCP: Dr. 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  He had an RV infarct with chronic total occlusion of a small nondominant RCA in the 1980s.  He had a drug-eluting stent to the CFX in 11/10.    He is very symptomatic with atrial fibrillation episodes. He has had a chronic pattern of mild shortness of breath and mild left shoulder pain/chest aching when walking up a hill (x 20 years).   He can walk 3 miles on flat ground without problems.  He golfs 3-4 times a week.  He walks for 20-30 minutes on a treadmill several times a week.   Echo in 7/16 showed normal LV systolic function, EF 123456 with moderate diastolic dysfunction.  Moderate TR with PA systolic pressure 65 mmHg.  Normal RV size and systolic function.  Given volume overload on exam and abnormal echo, he was started on low dose Lasix in the past.    He was started on Multaq but continued to have episodes of atrial fibrillation.  This was stopped, and he was admitted for Tikosyn loading.  After he went home, he developed left facial droop and left arm weakness.  He was found to have a small right parietal cortical infarct with hemorrhagic conversion.  His symptoms resolved quickly. INR was subtherapeutic at admission.  Warfarin was stopped and he was put on apixaban.   In 9/17, he was admitted with LLL PNA.  During the admission, he had a couple of relatively short runs of atrial fibrillation.  Since getting back home, he has not felt any atrial fibrillation.  He is in NSR today.    He is now doing well.  No palpitations since he left the hospital after recent PNA admission.  He remains short of breath walking up a hill but this is stable long-term.  No chest pain.  Played 9 holes of golf yesterday with no problems.  No BRBPR or melena.  Weight is down 6 lbs. He has not taken Lasix  in a month and feels like this has not affected his breathing.   ECG (9/17): NSR, QTc 406 msec   Labs (6/11): LDL 63, HDL 35, K 5.5, creatinine 1.1 Labs (6/12): LDL 58, HDL 53, TSH normal, K 4.7, creatinine 1 Labs (12/12): BNP 79 Labs (7/14): K 5, creatinine 1.1, LDL 48, HDL 34 Labs (8/15): LDL 48, HDL 33, TSH normal, K 5.4, creatinine 1.2 Labs (6/16): K 5.1, creatinine 1.27, HCT 42.5 Labs (10/16): LDL 37, HDL 32 Labs (11/16): K 4.4, creatinine 1.36, BNP 103 Labs  (2/17): K 4.5, creatinine 1.38, Mg 2.0, LDL 32 Labs (3/17): HCT 41.7 Labs (5/17): K 4.2, creatinine 1.17, Mg 2.1 Labs (9/17): K 4.2, creatinine 1.16, HCT 37.3  PMH: 1. CAD: RV infarct in the 1980s.  Chronic total occlusion of a small nondominant RCA.  11/10 PCI to CFX with Xience DES.  EF 60% on LV-gram at that time. ETT (11/14) with 9'07" exercise, no significant ST depression.  2.  Paroxysmal atrial fibrillation: Multaq was ineffective. Now on Tikosyn.  3.  Hyperlipidemia 4. GERD 5. HTN 6. Asthma 7. Chronic diastolic CHF: Echo ([id number]): EF 55-60%, mild LV hypertrophy, no regional WMAs, mild AI, mild MR, mild-moderate biatrial enlargement, PA systolic pressure 51 mmHg. Echo (7/16) with EF 60-65%, moderate diastolic dysfunction, normal RV size and systolic  function, mild-moderate MR, moderate TR, moderate biatrial enlargement, PA systolic pressure 65 mmHg.  8. CKD 9. CVA: Right parietal cortical CVA with peri-infarct hemorrhagic conversion.  CTA showed right M2 branch flow gap.    SH: Nonsmoker.  Retired Stage manager, lives in [place].  2 daughters.  Originally from [place].   FH: Brother with CABG.   ROS: All systems reviewed and negative except as per HPI.    Current Outpatient Prescriptions  Medication Sig Dispense Refill  . apixaban (ELIQUIS) 5 MG TABS tablet Take 1 tablet (5 mg total) by mouth 2 (two) times daily. 60 tablet 6  . atenolol (TENORMIN) 25 MG tablet Take 1 tablet (25 mg total) by mouth 2 (two) times  daily. 180 tablet 3  . atorvastatin (LIPITOR) 20 MG tablet Take 1 tablet (20 mg total) by mouth daily. (Patient taking differently: Take 20 mg by mouth daily after supper. ) 90 tablet 3  . beclomethasone (BECONASE-AQ) 42 MCG/SPRAY nasal spray Place 1 spray into both nostrils 2 (two) times daily. Dose is for each nostril.    Marland Kitchen buPROPion (WELLBUTRIN SR) 150 MG 12 hr tablet Take 1 tablet (150 mg total) by mouth daily. 100 tablet 4  . cetirizine (ZYRTEC) 10 MG tablet Take 10 mg by mouth daily with supper.     . Cholecalciferol (VITAMIN D) 2000 units tablet Take 2,000 Units by mouth daily.    Marland Kitchen dofetilide (TIKOSYN) 250 MCG capsule Take 1 capsule (250 mcg total) by mouth 2 (two) times daily. 14 capsule 0  . Fluticasone Furoate (ARNUITY ELLIPTA) 200 MCG/ACT AEPB Inhale 1 puff into the lungs daily.    . furosemide (LASIX) 20 MG tablet Take 20 mg by mouth daily as needed (ankle swelling).     Marland Kitchen guaiFENesin (MUCINEX) 600 MG 12 hr tablet Take 1 tablet (600 mg total) by mouth 2 (two) times daily. 30 tablet 0  . levalbuterol (XOPENEX HFA) 45 MCG/ACT inhaler Inhale 1 puff into the lungs every 4 (four) hours as needed for wheezing or shortness of breath.    . nitroGLYCERIN (NITROSTAT) 0.4 MG SL tablet Place 1 tablet (0.4 mg total) under the tongue every 5 (five) minutes x 3 doses as needed for chest pain. 25 tablet 2  . Omega-3 Fatty Acids (FISH OIL) 500 MG CAPS Take 500 mg by mouth daily.    Marland Kitchen omeprazole (PRILOSEC OTC) 20 MG tablet Take 1 tablet (20 mg total) by mouth daily. (Patient taking differently: Take 20 mg by mouth daily with supper. ) 100 tablet 4  . PROAIR HFA 108 (90 Base) MCG/ACT inhaler INHALE 2 PUFFS INTO THE LUNGS EVERY 6 HOURS AS NEEDED FOR WHEEZING 8.5 g 5  . tamsulosin (FLOMAX) 0.4 MG CAPS capsule TAKE ONE CAPSULE BY MOUTH DAILY (Patient taking differently: Take 0.4 mg by mouth daily after supper. ) 100 capsule 4   No current facility-administered medications for this visit.     BP 120/62    Pulse 80   Ht 5' 10.5" (1.791 m)   Wt 153 lb (69.4 kg)   BMI 21.64 kg/m  General: NAD Neck: JVP 7 cm, no thyromegaly or thyroid nodule.  Lungs: Crackles left base.  CV: Nondisplaced PMI.  Heart regular S1/S2, soft S4, no murmur.  No edema.  No carotid bruit.  Normal pedal pulses.  Abdomen: Soft, nontender, no hepatosplenomegaly, no distention.  Neurologic: Alert and oriented x 3.  Psych: Normal affect. Extremities: No clubbing or cyanosis.   Assessment/Plan: 1. CAD: Dr Caryn Section has had a  long-time pattern of dyspnea and mild shoulder/chest pain with walking up a hill.  This has really not changed  He is still able to golf, walk on a treadmill, etc with no problem.  ETT in 11/14 was a normal study.  - If he has any change in his exertional symptoms, he will let me know and I will arrange for ETT-Cardiolite.   - He is on apixaban without ASA given stable CAD.   - Continue statin, beta blocker.  2. Hyperlipidemia: Good lipids in 2/17.    3. Chronic diastolic CHF: Echo in [id number] suggested progressive diastolic dysfunction.  EF 60-65% with grade II diastolic dysfunction and PA systolic pressure up to 65 mmHg.  RV appeared normal. He looks euvolemic today and has not been taking Lasix.  He will weigh daily and let me know if weight starts to rise or if he starts to develop worsening exertional dyspnea.  - I will repeat echo in 7/18 to follow LV function as well as MR/TR.  4. Atrial fibrillation: Paroxysmal.  Episodes are quite symptomatic, he feels "bad" with atrial fibrillation even when he's rate-controlled. He had frequent breakthrough on Multaq but seems to be doing much better on Tikosyn. NSR today.  - Continue Tikosyn.  Recent labs and ECG ok. - If has breakthrough afib on Tikosyn, amiodarone would be an option.  - Now on apixaban with CVA on warfarin (was subtherapeutic).   5. HTN: BP controlled.   Followup in 6 months, I will see him at the [facility] Vascular clinic.     Loralie Champagne 10/27/2015

## 2015-11-10 ENCOUNTER — Encounter: Payer: Self-pay | Admitting: Family Medicine

## 2015-11-10 ENCOUNTER — Ambulatory Visit (INDEPENDENT_AMBULATORY_CARE_PROVIDER_SITE_OTHER): Payer: Medicare Other | Admitting: Family Medicine

## 2015-11-10 VITALS — BP 106/54 | HR 68 | Temp 97.4°F | Wt 156.9 lb

## 2015-11-10 DIAGNOSIS — I480 Paroxysmal atrial fibrillation: Secondary | ICD-10-CM

## 2015-11-10 DIAGNOSIS — J45909 Unspecified asthma, uncomplicated: Secondary | ICD-10-CM | POA: Diagnosis not present

## 2015-11-10 DIAGNOSIS — I519 Heart disease, unspecified: Secondary | ICD-10-CM | POA: Diagnosis not present

## 2015-11-10 DIAGNOSIS — J309 Allergic rhinitis, unspecified: Secondary | ICD-10-CM

## 2015-11-10 DIAGNOSIS — I5189 Other ill-defined heart diseases: Secondary | ICD-10-CM

## 2015-11-10 MED ORDER — FUROSEMIDE 20 MG PO TABS: 20.0000 mg | ORAL_TABLET | Freq: Every day | ORAL | 3 refills | Status: DC | PRN

## 2015-11-10 MED ORDER — FLUTICASONE FUROATE 200 MCG/ACT IN AEPB: 1.0000 | INHALATION_SPRAY | Freq: Every day | RESPIRATORY_TRACT | 11 refills | Status: DC

## 2015-11-10 NOTE — Patient Instructions (Signed)
Continue current medications  Follow-up as outlined is normal.  I refilled the Lasix and the inhaled steroid today

## 2015-11-10 NOTE — Progress Notes (Signed)
Pre visit review using our clinic review tool, if applicable. No additional management support is needed unless otherwise documented below in the visit note. 

## 2015-11-10 NOTE — Progress Notes (Signed)
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 One evening he developed shortness of breath and neck small morning he was very short of breath he went the [employer]. Chest x-ray showed a left lower lobe pneumonia. He was hospitalized for IV antibiotics and treatment of hypoxia. His pulse ox dropped 84% on room air. He did well finished a course of antibiotics and salt pulmonary as an outpatient. Advised a follow-up chest x-ray in a couple weeks to be sure there is no recurrent infection or tumor. Pulmonary wise he states he feels well no complaints. He went [facility] last spring because of his asthma. He's on the generic fluticasone 1 puff daily so for next when necessary Pro Air when necessary Beconase AQ nasal spray daily.  Last winter he developed recurrent and persistent AF. He was switched to tikosin by Dr. Marylou Mccoy. He's done well except for 2 episodes of AF in the summer that resolved fairly spontaneously. He also had an episode when he was hospitalized in September for pneumonia. That also resolved spontaneously. Cardiac wise he feels well and is in sinus rhythm today.  He's having persistent problems with his right ankle. He's got it anatomic abnormality and is been treated by Dr. Rolla Etienne. He's due to go back for follow-up. It's frustrating because it interferes with his ability to play golf. I suggested Wylene Simmer or [facility] for a second opinion when necessary  Vaccinations up-to-date seasonal flu shot given 10/02/2015.  Vital signs stable is afebrile impression status post pneumonia question viral plan.......... follow-up chest x-ray as outlined by pulmonary  #2 AF..... Asymptomatic in sinus rhythm on medication  #3 deformity right ankle,,,,,, followed by Dr. Rolla Etienne or [facility]

## 2015-11-19 ENCOUNTER — Ambulatory Visit (INDEPENDENT_AMBULATORY_CARE_PROVIDER_SITE_OTHER): Payer: Medicare Other | Admitting: Neurology

## 2015-11-19 ENCOUNTER — Encounter: Payer: Self-pay | Admitting: Neurology

## 2015-11-19 VITALS — BP 145/70 | HR 57 | Ht 70.5 in | Wt 160.2 lb

## 2015-11-19 DIAGNOSIS — I6990 Unspecified sequelae of unspecified cerebrovascular disease: Secondary | ICD-10-CM

## 2015-11-19 NOTE — Patient Instructions (Addendum)
I had a long d/w patient about his remote embolict stroke,atrial fibrillation, risk for recurrent stroke/TIAs, personally independently reviewed imaging studies and stroke evaluation results and answered questions.Continue Eliquis (apixaban) daily  for secondary stroke prevention and maintain strict control of hypertension with blood pressure goal below 130/90, diabetes with hemoglobin A1c goal below 6.5% and lipids with LDL cholesterol goal below 70 mg/dL. I also advised the patient to eat a healthy diet with plenty of whole grains, cereals, fruits and vegetables, exercise regularly and maintain ideal body weight Followup in the future with me in 1 year or call earlier if necessary.

## 2015-11-19 NOTE — Progress Notes (Signed)
[facility] [address] [place]. [place] [address] [phone]       OFFICE FOLLOW-UP NOTE  Mr. [name] MCCLELLAND, MD Date of Birth:  [birth year] Medical Record Number:  [id number]   HPI: 82 year old Caucasian male seen today for first office follow-up visit following [facility] admission in February 2017 for TIA. 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 He has no previous history of stroke nor TIA. INR was 1.9. CT scan of his head showed no acute intracranial abnormality. CT angiogram of head and neck showed a right M2 branch flow gap of 3 mm with preserved flow distally. Nondominant vertebral artery showed moderate to severe stenosis at the origin and in the distal left V4 segment. Patient's deficits cleared fairly rapidly. NIH stroke score was 4 at the time of his initial evaluation. Patient subsequently became clear all deficits with no intervention. He was LSN 6:15 PM on 03/31/2015. Patient was not administered TPA secondary to Rapid resolution of deficits; INR of 1.9. He was admitted for further evaluation and treatment. CT head on admission showed an old lacunar infarct in left basal ganglia and mild age-related cortical atrophy. There is evidence of severe pansinusitis. CT angiogram was negative for any large vessel occlusion but did show a right M2 middle cerebral artery branch occlusion. MRI scan of the brain showed small hemorrhagic infarct in the right parietal region which is consistent with the area of the right M2 branch occlusion. Patient remained stable during the hospital stay. LDL cholesterol 72. Hemoglobin A1c was 6. Transthoracic  echo was not repeated as he had previous one in July 2016 which was normal. Patient was changed from warfarin to eliquis for anticoagulation and the time of discharge. States his done well since discharge. He is had no recurrent symptoms. He is tolerating eliquis well without bruising or bleeding. He has seen his cardiologist Dr. Aundra Dubin follow-up. He states his blood pressure is well controlled and today it is 1-3/67 in office. He is tolerating Tikosyn and has stayed in sinus rhythm. Update 11/19/2015 ; Dr. Caryn Section returns for follow-up today after last visit with me 6 months ago. Congestive do well without recurrent stroke or TIA symptoms. He remains on eliquis which is tolerating well without significant bleeding and only minor bruising. He remains quite active and does play golf 3-4 times per week. His bottle slightly better in the 90s and his ankle. He states his blood pressure is well controlled though today it is slightly elevated at 145/70. He remains on Lipitor which is tolerating well without muscle aches and pains and states his last lipid profile checked by primary physician was satisfactory. He was admitted briefly in August this year for with viral pneumonia but has recovered well from that. He has no complaints today. He continues to have regular follow-up visits with his primary physician and cardiologist. ROS:   14 system review of systems is positive for  no complaints today  PMH:  Past Medical History:  Diagnosis Date  . Allergic rhinitis   . Anxiety   . Asthma   . Atrial fibrillation ([place])   . CAD (coronary artery disease)   . Depression   . Diverticulosis 03/16/1995  .  DJD (degenerative joint disease)    "most joints" (03/11/2015)  . GERD (gastroesophageal reflux disease) 12/30/2000  . Heart attack 1984  . Hiatal hernia 12/30/2000  . History of blood transfusion 1988   "related to GI bleeding OR"  . History of duodenal ulcer 10/15/1986  . Hyperlipidemia   . Hypertension   .  Sinus bradycardia   . Stroke [facility])     Social History:  Social History   Social History  . Marital status: Married    Spouse name: N/A  . Number of children: 2  . Years of education: N/A   Occupational History  . DOCTOR Retired   Social History Main Topics  . Smoking status: Never Smoker  . Smokeless tobacco: Never Used  . Alcohol use No  . Drug use: No  . Sexual activity: Not Currently   Other Topics Concern  . Not on file   Social History Narrative   Married. Regular exercise - yes.     Medications:   Current Outpatient Prescriptions on File Prior to Visit  Medication Sig Dispense Refill  . apixaban (ELIQUIS) 5 MG TABS tablet Take 1 tablet (5 mg total) by mouth 2 (two) times daily. 60 tablet 6  . atenolol (TENORMIN) 25 MG tablet Take 1 tablet (25 mg total) by mouth 2 (two) times daily. 180 tablet 3  . atorvastatin (LIPITOR) 20 MG tablet Take 1 tablet (20 mg total) by mouth daily. (Patient taking differently: Take 20 mg by mouth daily after supper. ) 90 tablet 3  . beclomethasone (BECONASE-AQ) 42 MCG/SPRAY nasal spray Place 1 spray into both nostrils 2 (two) times daily. Dose is for each nostril.    Marland Kitchen buPROPion (WELLBUTRIN SR) 150 MG 12 hr tablet Take 1 tablet (150 mg total) by mouth daily. 100 tablet 4  . cetirizine (ZYRTEC) 10 MG tablet Take 10 mg by mouth daily with supper.     . Cholecalciferol (VITAMIN D) 2000 units tablet Take 2,000 Units by mouth daily.    Marland Kitchen dofetilide (TIKOSYN) 250 MCG capsule Take 1 capsule (250 mcg total) by mouth 2 (two) times daily. 14 capsule 0  . Fluticasone Furoate (ARNUITY ELLIPTA) 200 MCG/ACT AEPB Inhale 1 puff into the lungs daily. 30 each 11  . furosemide (LASIX) 20 MG tablet Take 1 tablet (20 mg total) by mouth daily as needed (ankle swelling). 100 tablet 3  . guaiFENesin (MUCINEX) 600 MG 12 hr tablet Take 1 tablet (600 mg total) by mouth 2 (two) times daily. 30 tablet 0  . levalbuterol (XOPENEX HFA) 45 MCG/ACT inhaler Inhale 1 puff  into the lungs every 4 (four) hours as needed for wheezing or shortness of breath.    . nitroGLYCERIN (NITROSTAT) 0.4 MG SL tablet Place 1 tablet (0.4 mg total) under the tongue every 5 (five) minutes x 3 doses as needed for chest pain. 25 tablet 2  . Omega-3 Fatty Acids (FISH OIL) 500 MG CAPS Take 500 mg by mouth daily.    Marland Kitchen omeprazole (PRILOSEC OTC) 20 MG tablet Take 1 tablet (20 mg total) by mouth daily. (Patient taking differently: Take 20 mg by mouth daily with supper. ) 100 tablet 4  . PROAIR HFA 108 (90 Base) MCG/ACT inhaler INHALE 2 PUFFS INTO THE LUNGS EVERY 6 HOURS AS NEEDED FOR WHEEZING 8.5 g 5  . tamsulosin (FLOMAX) 0.4 MG CAPS capsule TAKE ONE CAPSULE BY MOUTH DAILY (Patient taking differently: Take 0.4 mg by mouth daily after supper. ) 100 capsule 4   No current  facility-administered medications on file prior to visit.     Allergies:   Allergies  Allergen Reactions  . Advil [Ibuprofen] Nausea Only and Rash  . Asa [Aspirin] Rash  . Tylenol [Acetaminophen] Rash    Physical Exam General: well developed, well nourished elderly Caucasian male, seated, in no evident distress Head: head normocephalic and atraumatic.  Neck: supple with no carotid or supraclavicular bruits Cardiovascular: regular rate and rhythm, no murmurs Musculoskeletal: no deformity Skin:  no rash/petichiae Vascular:  Normal pulses all extremities Vitals:   11/19/15 1013  BP: (!) 145/70  Pulse: (!) 57   Neurologic Exam Mental Status: Awake and fully alert. Oriented to place and time. Recent and remote memory intact. Attention span, concentration and fund of knowledge appropriate. Mood and affect appropriate.  Cranial Nerves: Fundoscopic exam reveals sharp disc margins. Pupils equal, briskly reactive to light. Extraocular movements full without nystagmus. Visual fields full to confrontation. Hearing intact. Facial sensation intact. Face, tongue, palate moves normally and symmetrically.  Motor: Normal bulk  and tone. Normal strength in all tested extremity muscles. Sensory.: intact to touch ,pinprick .position and vibratory sensation.  Coordination: Rapid alternating movements normal in all extremities. Finger-to-nose and heel-to-shin performed accurately bilaterally. Gait and Station: Arises from chair without difficulty. Stance is normal. Gait demonstrates normal stride length and balance . Able to heel, toe and tandem walk without difficulty.  Reflexes: 1+ and symmetric. Toes downgoing.      ASSESSMENT: 82 year old Caucasian male with embolic small right parietal hemorrhagic infarct in February 2017 second to atrial fibrillation with slightly suboptimal anticoagulation on warfarin (INR 1.9)    PLAN: I had a long d/w patient about his remote embolict stroke,atrial fibrillation, risk for recurrent stroke/TIAs, personally independently reviewed imaging studies and stroke evaluation results and answered questions.Continue Eliquis (apixaban) daily  for secondary stroke prevention and maintain strict control of hypertension with blood pressure goal below 130/90, diabetes with hemoglobin A1c goal below 6.5% and lipids with LDL cholesterol goal below 70 mg/dL. I also advised the patient to eat a healthy diet with plenty of whole grains, cereals, fruits and vegetables, exercise regularly and maintain ideal body weight Followup in the future with me in 1 year or call earlier if necessary. Antony Contras, MD   Note: This document was prepared with digital dictation and possible smart phrase technology. Any transcriptional errors that result from this process are unintentional

## 2015-11-27 ENCOUNTER — Ambulatory Visit (INDEPENDENT_AMBULATORY_CARE_PROVIDER_SITE_OTHER): Payer: Medicare Other | Admitting: Sports Medicine

## 2015-11-27 ENCOUNTER — Encounter: Payer: Self-pay | Admitting: Sports Medicine

## 2015-11-27 DIAGNOSIS — M25571 Pain in right ankle and joints of right foot: Secondary | ICD-10-CM | POA: Diagnosis not present

## 2015-11-27 DIAGNOSIS — R269 Unspecified abnormalities of gait and mobility: Secondary | ICD-10-CM | POA: Diagnosis not present

## 2015-11-27 DIAGNOSIS — M25579 Pain in unspecified ankle and joints of unspecified foot: Secondary | ICD-10-CM | POA: Diagnosis not present

## 2015-11-27 NOTE — Progress Notes (Signed)
  CC: bilateral foot pain worse on the right  Patient is a retired Stage manager. He stays active playing golf and walking. He has had persistent foot pain for 20 years. During this time he has been seen by an orthopedic foot specialist and also by podiatry. They found him to have significant arch collapse. Because of this he's been custom orthotics and also uses an ankle strap on the right.  During the past few months he's had more right foot pain. The orthotics are no longer totally relieving the pain and it worsens after golf. Comes for my opinion.  Past history Atrial fibrillation followed by cardiology Followed by neurology for cerebrovascular disease  Social history Nonsmoker  Pertinent review of systems Posterior tibialis tendon pain that did respond briefly to injection by Dr. Felisa Bonier Chronic right ankle pain No history of foot numbness No sciatic  Physical examination Pleasant older male in no acute distress BP (!) 108/44   Ht 5' 10.5" (1.791 m)   Wt 160 lb (72.6 kg)   BMI 22.63 kg/m   Right foot There is complete collapse of the midfoot with total loss of the longitudinal arch Minimal posterior tibialis function noted Subluxation of the foot with lateral rotation starting at the subtalar joint leading to calcaneal valgus Loss of transverse arch  Left foot Collapse of the midfoot with loss of the longitudinal arch Slightly better posterior tibialis function Some turnout of the left foot but less subluxation at the subtalar joint Calcaneal valgus  Walking gait shows marked pronation and turnout of both feet with right worse than left  Dorsiflexion at the ankle joint and plantarflexion of right and left are preserved  XRays - reviewed by me and show significant navicular drop and loss of arch;  No charcot joint changes but some DJD changes

## 2015-11-27 NOTE — Assessment & Plan Note (Signed)
We tested him in a stronger ankle compression sleeve This makes ankle feel more stable He gets good pain relief  With this and with increased medial arch support we will see if we can lessen his pain  Recheck 3 months

## 2015-11-27 NOTE — Assessment & Plan Note (Signed)
I added additional scaphoid support along with a medial heel wedge to his current custom orthotics and to an over-the-counter pair  This correction he had less ankle pain The left foot corrects significantly the right foot still has moderate pronation even after correction  We will try this for the next few months but consider need custom orthotics with more correction if not improving

## 2015-12-03 ENCOUNTER — Ambulatory Visit (INDEPENDENT_AMBULATORY_CARE_PROVIDER_SITE_OTHER): Payer: Medicare Other | Admitting: Internal Medicine

## 2015-12-03 ENCOUNTER — Encounter: Payer: Self-pay | Admitting: Internal Medicine

## 2015-12-03 ENCOUNTER — Ambulatory Visit: Payer: Medicare Other | Admitting: Podiatry

## 2015-12-03 ENCOUNTER — Ambulatory Visit (INDEPENDENT_AMBULATORY_CARE_PROVIDER_SITE_OTHER)
Admission: RE | Admit: 2015-12-03 | Discharge: 2015-12-03 | Disposition: A | Discharge status: Home / Self Care | Payer: Medicare Other | Source: Ambulatory Visit | Attending: Internal Medicine | Admitting: Internal Medicine

## 2015-12-03 VITALS — BP 140/76 | HR 54 | Ht 70.5 in | Wt 159.0 lb

## 2015-12-03 DIAGNOSIS — Z8709 Personal history of other diseases of the respiratory system: Secondary | ICD-10-CM

## 2015-12-03 DIAGNOSIS — Z8701 Personal history of pneumonia (recurrent): Secondary | ICD-10-CM

## 2015-12-03 DIAGNOSIS — J189 Pneumonia, unspecified organism: Secondary | ICD-10-CM | POA: Diagnosis not present

## 2015-12-03 NOTE — Patient Instructions (Addendum)
ICD-9-CM ICD-10-CM   1. History of pneumonia V12.61 Z87.01   2. History of asthma V12.69 Z87.09     Glad you're better from your recent pneumonia. Clinically pneumonia resolved but need to see what is on cxr Glad uptodate with flu shot  Plan = I will inform Dr. Aundra Dubin about your atrial fibrillation = continue nasal steroi - Continue inhaled steroids Arnuity - Use albuterol as needed - do cxr 2 view - will call with results   Follow-up - 6 months or sooner    - followup plan can change depending on xray result

## 2015-12-03 NOTE — Progress Notes (Addendum)
Subjective:     Patient ID: [name] Spence, MD, male   DOB: [birth year], 82 y.o.   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 Followed in past for asthma w/ bronchitis. LOV around 2008. Medical hx CAD/ MI/CABG, AFib/ warfarin, diastolic dysfunction, GERD He did well for several years with help from Dr Sherren Mocha. Starting about 6 months ago with no obvious trigger, he began having increased wheeze, cough, needing SABA 2-3x/ day and Qvar only intermittently. He has been worse this Spring, blaming pollen. Sputum remains clear. Denies fever, nodes, blood, edema, chest pain. Hx  Intermittent AFib- no pacemaker Had BCG in medical school.         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 Followed in past for asthma w/ bronchitis.  Medical hx CAD/ MI/CABG,P AFib/ warfarin, diastolic dysfunction, GERD. Dr Coralie Keens alerted [place] that pt was having more trouble, so we could work him in today. Follows For: Pt c/o wheezing and SOB, prod cough with thick yellow mucus. States he had asthma flare up last night. Has has had 3 episodes of A-fib in last week, most recent was 06/18/14.  Persistent variable chest rattle has been going on almost since he was last seen here. Using albuterol rescue inhaler once or twice daily. He has caught a cold from his wife and overnight began or persistent wheeze, using rescue inhaler 4 times daily and continuing his Qvar. Transient relief only. Sputum trace yellow. No fever. He has a little left over prednisone but reports that 20 mg of prednisone daily is enough of a stimulant to trigger his atrial fib.  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  Medical hx CAD/ MI/CABG,P AFib/ warfarin, diastolic dysfunction, GERD Follow For: Pt  doing well since last visit. Still post nasal drip. Denies wheezing, SOB, Prod cough.  Has not needed rescue inhaler in months and denies wheeze since a cold in early spring. Using Qvar twice daily and Singulair. Notices some hoarseness, mouth breathing  Told he has some pulmonary hypertension. We discussed possible role of oxygen desaturation. Does not recognize symptoms of sleep apnea. CXR 06/24/14 IMPRESSION: No active cardiopulmonary disease. Electronically Signed  By: Franchot Gallo M.D.  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 Medical history CAD/MI/CABG, P A. fib/warfarin, diastolic dysfunction, GERD FOLLOWS FOR: Pt states he has had very little SOB or wheezing since last OV. Has had trouble with Afib. He has continued Flovent 110 and pro air rescue inhaler twice daily each. Has not recognized any association of atrial fib or palpitations with use of albuterol. [name] last year with no particular benefit recognized over his current regimen. He asks about persistent hoarseness. Overnight Oximetry 09/30/2014-normal, not qualifying for sleep O2   OV 10/23/2015  Chief Complaint  Patient presents with  . Hospitalization Follow-up    Pt recently hospitalized for LLL CAP. Pt c/o prod cough with green, blood streaked mucus.  Pt discharged yesterday.     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 In June 2017 he had worsening asthma symptoms and he saw Dr. Hardie Pulley and allergy. According to his history exhaled nitric oxide at this visit was elevated at 70 ppb. He was then started on inhaled corticosteroid ARNUITY . Marland Kitchen Up until that point he was only taking her steroids as needed. After that  he started taking her steroids regular basis. At follow-up his   He maintain himself on the inhaled steroid. Apparently blood allergy panel was negative. He was then leading his baseline life with control of atrial fibrillation.  However on 10/19/2015 he got hospitalized for left lower lobe pneumonia. Personally visualized the chest x-ray review the records and agree with the findings. It sounds like he had bacterial pneumonia not otherwise specified. He was discharged yesterday. He still on Augmentin. He has postnasal drainage but is not taking any nasal steroids. He has nocturnal acid reflux and is on fish oil. He is feeling back to baseline other than some post pneumonia fatigue. He also feels his atrial fibrillation is somewhat out of control. He has some occasional streaky hemoptysis but this is improving.   OV 12/03/2015  Chief Complaint  Patient presents with  . Follow-up    Pt states his SOB is at baseline - doing well. Pt denies cough, CP/tightness, and f/c/s.     Follow-up left lower lobe pneumonia.  He was hospitalized 10/19/2015 for left lower lobe pneumonia. This is clinical follow-up for the same. He no longer has hemoptysis since his last office visit mid September 2017. He continues to feel well. He is asymptomatic without any shortness of breath wheezing or chest tightness or cough. Only she is was nasal drip that bothers him. He tells me that a year ago he had sinus scan and it showed significant sinus congestion. He is followed up with ENT in the past but is not in a while. Inhaled steroids to the nose helping him but not fully controlled this. He says he will follow-up with ENT for the same. But at this point overall he is feeling fine and playing golf. He is compliant with his inhaled corticosteroid ARNUITY. He will need a chest x-ray today. He is up-to-date with his flu shot.   In interim he did see Dr Aundra Dubin for  Afib and notes reviewed    has a past medical history of Allergic rhinitis; Anxiety; Asthma; Atrial fibrillation ([place]); CAD (coronary artery disease); Depression; Diverticulosis (03/16/1995); DJD (degenerative joint disease); GERD (gastroesophageal reflux disease) (12/30/2000); Heart attack  (1984); Hiatal hernia (12/30/2000); History of blood transfusion (1988); History of duodenal ulcer (10/15/1986); Hyperlipidemia; Hypertension; Sinus bradycardia; and Stroke ([place]).   reports that he has never smoked. He has never used smokeless tobacco.  Past Surgical History:  Procedure Laterality Date  . CARDIAC CATHETERIZATION    . CATARACT EXTRACTION W/ INTRAOCULAR LENS  IMPLANT, BILATERAL Bilateral ?2013  . CHOLECYSTECTOMY OPEN  1989  . CORONARY ANGIOPLASTY WITH STENT PLACEMENT  2010  . hospital [facility]  1984   heart attack, hypoplastic right coronary  . INGUINAL HERNIA REPAIR Right ~ 1990  . NASAL SEPTUM SURGERY  1972  . TONSILLECTOMY    . TOTAL KNEE ARTHROPLASTY Bilateral 1998-2004   "right-left"  . VAGOTOMY AND PYLOROPLASTY  1988   "bleeding duodenal ulcer"    Allergies  Allergen Reactions  . Advil [Ibuprofen] Nausea Only and Rash  . Asa [Aspirin] Rash  . Tylenol [Acetaminophen] Rash    Immunization History  Administered Date(s) Administered  . Influenza Split 10/23/2012, 10/02/2015  . Influenza Whole 10/27/2007, 11/13/2008, 10/29/2009, 11/09/2010  . Influenza-Unspecified 12/09/2013, 10/28/2014  . Pneumococcal Conjugate-13 09/17/2013  . Pneumococcal Polysaccharide-23 08/30/2000, 11/29/2005  . Td 06/20/2007  . Zoster 06/20/2007    Family History  Problem Relation Age of Onset  . Coronary artery disease Mother     deceased  . Deep  vein thrombosis Sister   . Coronary artery disease Brother   . Asthma Brother   . Colon cancer Neg Hx      Current Outpatient Prescriptions:  .  apixaban (ELIQUIS) 5 MG TABS tablet, Take 1 tablet (5 mg total) by mouth 2 (two) times daily., Disp: 60 tablet, Rfl: 6 .  atenolol (TENORMIN) 25 MG tablet, Take 1 tablet (25 mg total) by mouth 2 (two) times daily., Disp: 180 tablet, Rfl: 3 .  atorvastatin (LIPITOR) 20 MG tablet, Take 1 tablet (20 mg total) by mouth daily. (Patient taking differently: Take 20 mg by mouth daily after supper. ),  Disp: 90 tablet, Rfl: 3 .  beclomethasone (BECONASE-AQ) 42 MCG/SPRAY nasal spray, Place 1 spray into both nostrils 2 (two) times daily. Dose is for each nostril., Disp: , Rfl:  .  buPROPion (WELLBUTRIN SR) 150 MG 12 hr tablet, Take 1 tablet (150 mg total) by mouth daily., Disp: 100 tablet, Rfl: 4 .  cetirizine (ZYRTEC) 10 MG tablet, Take 10 mg by mouth daily with supper. , Disp: , Rfl:  .  Cholecalciferol (VITAMIN D) 2000 units tablet, Take 2,000 Units by mouth daily., Disp: , Rfl:  .  dofetilide (TIKOSYN) 250 MCG capsule, Take 1 capsule (250 mcg total) by mouth 2 (two) times daily., Disp: 14 capsule, Rfl: 0 .  Fluticasone Furoate (ARNUITY ELLIPTA) 200 MCG/ACT AEPB, Inhale 1 puff into the lungs daily., Disp: 30 each, Rfl: 11 .  furosemide (LASIX) 20 MG tablet, Take 1 tablet (20 mg total) by mouth daily as needed (ankle swelling)., Disp: 100 tablet, Rfl: 3 .  guaiFENesin (MUCINEX) 600 MG 12 hr tablet, Take 1 tablet (600 mg total) by mouth 2 (two) times daily. (Patient taking differently: Take 600 mg by mouth 2 (two) times daily as needed. ), Disp: 30 tablet, Rfl: 0 .  levalbuterol (XOPENEX HFA) 45 MCG/ACT inhaler, Inhale 1 puff into the lungs every 4 (four) hours as needed for wheezing or shortness of breath., Disp: , Rfl:  .  nitroGLYCERIN (NITROSTAT) 0.4 MG SL tablet, Place 1 tablet (0.4 mg total) under the tongue every 5 (five) minutes x 3 doses as needed for chest pain., Disp: 25 tablet, Rfl: 2 .  tamsulosin (FLOMAX) 0.4 MG CAPS capsule, TAKE ONE CAPSULE BY MOUTH DAILY (Patient taking differently: Take 0.4 mg by mouth daily after supper. ), Disp: 100 capsule, Rfl: 4 .  omeprazole (PRILOSEC OTC) 20 MG tablet, Take 1 tablet (20 mg total) by mouth daily. (Patient taking differently: Take 20 mg by mouth daily with supper. ), Disp: 100 tablet, Rfl: 4 .  PROAIR HFA 108 (90 Base) MCG/ACT inhaler, INHALE 2 PUFFS INTO THE LUNGS EVERY 6 HOURS AS NEEDED FOR WHEEZING (Patient not taking: Reported on 12/03/2015),  Disp: 8.5 g, Rfl: 5   Review of Systems     Objective:   Physical Exam  Constitutional: He is oriented to person, place, and time. He appears well-developed and well-nourished. No distress.  HENT:  Head: Normocephalic and atraumatic.  Right Ear: External ear normal.  Left Ear: External ear normal.  Mouth/Throat: Oropharynx is clear and moist. No oropharyngeal exudate.  Eyes: Conjunctivae and EOM are normal. Pupils are equal, round, and reactive to light. Right eye exhibits no discharge. Left eye exhibits no discharge. No scleral icterus.  Neck: Normal range of motion. Neck supple. No JVD present. No tracheal deviation present. No thyromegaly present.  Cardiovascular: Normal rate, regular rhythm and intact distal pulses.  Exam reveals no gallop and no  friction rub.   No murmur heard. Pulmonary/Chest: Effort normal and breath sounds normal. No respiratory distress. He has no wheezes. He has no rales. He exhibits no tenderness.  Abdominal: Soft. Bowel sounds are normal. He exhibits no distension and no mass. There is no tenderness. There is no rebound and no guarding.  Laparotomy scar in the midline  Musculoskeletal: Normal range of motion. He exhibits no edema or tenderness.  Lymphadenopathy:    He has no cervical adenopathy.  Neurological: He is alert and oriented to person, place, and time. He has normal reflexes. No cranial nerve deficit. Coordination normal.  Skin: Skin is warm and dry. No rash noted. He is not diaphoretic. No erythema. No pallor.  Psychiatric: He has a normal mood and affect. His behavior is normal. Judgment and thought content normal.  Nursing note and vitals reviewed.   Vitals:   12/03/15 1136  BP: 140/76  Pulse: (!) 54  SpO2: 99%  Weight: 159 lb (72.1 kg)  Height: 5' 10.5" (1.791 m)   Estimated body mass index is 22.49 kg/m as calculated from the following:   Height as of this encounter: 5' 10.5" (1.791 m).   Weight as of this encounter: 159 lb (72.1  kg).      Assessment:       ICD-9-CM ICD-10-CM   1. History of pneumonia V12.61 Z87.01 DG Chest 2 View  2. History of asthma V12.69 Z87.09 DG Chest 2 View       Plan:      Glad you're better from your recent pneumonia. Clinically pneumonia resolved but need to see what is on cxr Glad uptodate with flu shot  Plan  = continue nasal steroid - Continue inhaled steroids Arnuity - Use albuterol as needed - do cxr 2 view - will call with results   Follow-up - 6 months or sooner    - followup plan can change depending on xray result    Dr. Brand Males, M.D., [facility].C.P Pulmonary and Critical Care Medicine Staff Physician [place] [facility] Pager: [phone], If no answer or between  15:00h - 7:00h: call [phone]  12/03/2015 11:55 AM

## 2015-12-04 ENCOUNTER — Telehealth: Payer: Self-pay | Admitting: Internal Medicine

## 2015-12-04 DIAGNOSIS — R918 Other nonspecific abnormal finding of lung field: Secondary | ICD-10-CM

## 2015-12-04 NOTE — Telephone Encounter (Signed)
  He still has infiltrate in left base. Do not understand how and why ebecause he was felling fine and exam normal  Plan  - do CT chest with contrast in 1 month  Thanks  Dr. Brand Males, M.D., [facility].C.P Pulmonary and Critical Care Medicine Staff Physician [place] [facility] Pager: [phone], If no answer or between  15:00h - 7:00h: call [phone]  12/04/2015 9:06 AM     Dg Chest 2 View  Result Date: 12/03/2015 CLINICAL DATA:  Followup pneumonia EXAM: CHEST  2 VIEW COMPARISON:  10/21/2015, 10/19/2015, 06/24/2014 FINDINGS: Persistent airspace disease in the left lower lobe. On the lateral view, there is some improvement posteriorly however there is a new area of patchy airspace disease in the anterior segment of the left lower lobe which was not present previously. No significant pleural effusion. No adenopathy in the hila. 8 mm nodular density left lung base is similar to prior studies and is most consistent with a nipple shadow. Right lung remains clear. Heart size within normal limits. Calcification in the aortic arch unchanged. Negative thoracic spine IMPRESSION: Persistent left lower lobe airspace disease. There is partial clearing in the left posterior base with a new area of airspace disease in the anterior base on the left. Follow-up CXR is suggested in 1 month. If this does not clear, CT chest is suggested for further evaluation. 1 cm nodule left lung base likely a nipple shadow. Attention on CT is suggested. Electronically Signed   By: Franchot Gallo M.D.   On: 12/03/2015 13:50

## 2015-12-05 ENCOUNTER — Telehealth: Payer: Self-pay | Admitting: Internal Medicine

## 2015-12-05 ENCOUNTER — Other Ambulatory Visit: Payer: Self-pay | Admitting: Internal Medicine

## 2015-12-05 DIAGNOSIS — R918 Other nonspecific abnormal finding of lung field: Secondary | ICD-10-CM

## 2015-12-05 NOTE — Telephone Encounter (Signed)
Called and spoke to pt. Informed him of the results and recs per MR. Order placed. Pt verbalized understanding and denied any further questions or concerns at this time.   

## 2015-12-05 NOTE — Telephone Encounter (Signed)
Spoke with pt. States that he is returning a call to [facility] about a CT.

## 2015-12-05 NOTE — Telephone Encounter (Signed)
Pt scheduled @[facility]  imaging  01/05/16@8 :30am pt is aware of this appt Joellen Jersey

## 2015-12-05 NOTE — Telephone Encounter (Signed)
lmtcb x1 for pt. 

## 2015-12-05 NOTE — Telephone Encounter (Signed)
Spoke to pt [email or web address]  has been cancelled and we will reschedule it for [facility] radiology per pt's request Joellen Jersey

## 2015-12-08 ENCOUNTER — Other Ambulatory Visit: Payer: Self-pay | Admitting: Cardiology

## 2015-12-10 ENCOUNTER — Other Ambulatory Visit: Payer: Self-pay | Admitting: Family Medicine

## 2015-12-18 ENCOUNTER — Other Ambulatory Visit (HOSPITAL_COMMUNITY): Payer: Self-pay | Admitting: Nurse Practitioner

## 2015-12-29 ENCOUNTER — Encounter: Payer: Self-pay | Admitting: Sports Medicine

## 2015-12-29 ENCOUNTER — Ambulatory Visit (INDEPENDENT_AMBULATORY_CARE_PROVIDER_SITE_OTHER): Payer: Medicare Other | Admitting: Sports Medicine

## 2015-12-29 DIAGNOSIS — R269 Unspecified abnormalities of gait and mobility: Secondary | ICD-10-CM

## 2015-12-29 MED ORDER — TRAMADOL HCL 50 MG PO TABS: 50.0000 mg | ORAL_TABLET | Freq: Two times a day (BID) | ORAL | 2 refills | Status: DC | PRN

## 2015-12-29 NOTE — Assessment & Plan Note (Signed)
We will give him a trial of new orthotics  use compression intermittently for comfort  Test this over the next couple months and then we will reassess and modify as needed

## 2015-12-29 NOTE — Progress Notes (Signed)
CC: RT ankle pain  The patient returns for followup of right ankle and foot pain Compression sleeve for the ankle helps somewhat but is uncomfortable after wearing too long Additional arch and heel padding that we added to his current orthotics also helps but does not alleviate the pain  He has had a full series of x-rays and shows extensive arthritis and breakdown of both feet The right is always worse than the left He is able to do activities of daily living Much of his pain is with golf  Past history He has a number of documented health problems which currently are stable History of bilateral knee replacements History of asthma History of heart failure and atrial fibrillation  Review of systems Right medial ankle pain and sometimes swelling Left ankle shows only mild pain Denies shortness of breath with activity No chest pain with activity  Physical examination Pleasant older man who is thin and in no acute distress BP 125/70   Pulse 64   Ht 5' 10.5" (1.791 m)   Wt 160 lb (72.6 kg)   BMI 22.63 kg/m   Physical examination is unchanged from previous visit He has marked breakdown of the rear foot mid foot and forefoot of the right He had a similar breakdown of the rear foot mid foot and forefoot of the left but just not quite as severe  Walking gait is severely pronated He has external rotation of both feet with walking From the rear he appears to have extensive calcaneal valgus and  subtalar subluxation  Patient was fitted for a : standard, cushioned, semi-rigid orthotic. The orthotic was heated and afterward the patient stood on the orthotic blank positioned on the orthotic stand. The patient was positioned in subtalar neutral position and 10 degrees of ankle dorsiflexion in a weight bearing stance. After completion of molding, a stable base was applied to the orthotic blank. The blank was ground to a stable position for weight bearing. Size: 11 Red EVA Base: Blue Med  EVA Posting: scaphoid pads and heel wedges bilat Additional orthotic padding: First ray posting  On post Orthotic gait - Are able to lessen the degree of pronation; this does not correct the foot turn out. He does feel some improved support on the right

## 2016-01-05 ENCOUNTER — Ambulatory Visit
Admission: RE | Admit: 2016-01-05 | Discharge: 2016-01-05 | Disposition: A | Discharge status: Home / Self Care | Payer: Medicare Other | Source: Ambulatory Visit | Attending: Internal Medicine | Admitting: Internal Medicine

## 2016-01-05 DIAGNOSIS — R918 Other nonspecific abnormal finding of lung field: Secondary | ICD-10-CM

## 2016-01-05 DIAGNOSIS — J189 Pneumonia, unspecified organism: Secondary | ICD-10-CM | POA: Diagnosis not present

## 2016-01-06 ENCOUNTER — Inpatient Hospital Stay: Admission: RE | Admit: 2016-01-06 | Payer: Medicare Other | Source: Ambulatory Visit

## 2016-01-07 ENCOUNTER — Other Ambulatory Visit: Payer: Medicare Other

## 2016-01-07 ENCOUNTER — Encounter: Payer: Self-pay | Admitting: Family Medicine

## 2016-01-07 ENCOUNTER — Other Ambulatory Visit: Payer: Self-pay | Admitting: Emergency Medicine

## 2016-01-07 ENCOUNTER — Ambulatory Visit (INDEPENDENT_AMBULATORY_CARE_PROVIDER_SITE_OTHER): Payer: Medicare Other | Admitting: Family Medicine

## 2016-01-07 DIAGNOSIS — I5032 Chronic diastolic (congestive) heart failure: Secondary | ICD-10-CM

## 2016-01-07 DIAGNOSIS — J45909 Unspecified asthma, uncomplicated: Secondary | ICD-10-CM

## 2016-01-07 DIAGNOSIS — N401 Enlarged prostate with lower urinary tract symptoms: Secondary | ICD-10-CM

## 2016-01-07 DIAGNOSIS — R351 Nocturia: Secondary | ICD-10-CM

## 2016-01-07 DIAGNOSIS — I10 Essential (primary) hypertension: Secondary | ICD-10-CM

## 2016-01-07 DIAGNOSIS — I481 Persistent atrial fibrillation: Secondary | ICD-10-CM | POA: Diagnosis not present

## 2016-01-07 DIAGNOSIS — N289 Disorder of kidney and ureter, unspecified: Secondary | ICD-10-CM

## 2016-01-07 DIAGNOSIS — I4819 Other persistent atrial fibrillation: Secondary | ICD-10-CM

## 2016-01-07 DIAGNOSIS — K219 Gastro-esophageal reflux disease without esophagitis: Secondary | ICD-10-CM

## 2016-01-07 DIAGNOSIS — F411 Generalized anxiety disorder: Secondary | ICD-10-CM

## 2016-01-07 LAB — HEPATIC FUNCTION PANEL
ALT: 12 U/L (ref 0–53)
AST: 13 U/L (ref 0–37)
Albumin: 3.8 g/dL (ref 3.5–5.2)
Alkaline Phosphatase: 107 U/L (ref 39–117)
BILIRUBIN TOTAL: 0.7 mg/dL (ref 0.2–1.2)
Bilirubin, Direct: 0.2 mg/dL (ref 0.0–0.3)
Total Protein: 6.5 g/dL (ref 6.0–8.3)

## 2016-01-07 LAB — CBC WITH DIFFERENTIAL/PLATELET
BASOS PCT: 0.4 % (ref 0.0–3.0)
Basophils Absolute: 0 10*3/uL (ref 0.0–0.1)
EOS PCT: 3.3 % (ref 0.0–5.0)
Eosinophils Absolute: 0.3 10*3/uL (ref 0.0–0.7)
HCT: 39.6 % (ref 39.0–52.0)
HEMOGLOBIN: 13.3 g/dL (ref 13.0–17.0)
Lymphocytes Relative: 15 % (ref 12.0–46.0)
Lymphs Abs: 1.2 10*3/uL (ref 0.7–4.0)
MCHC: 33.4 g/dL (ref 30.0–36.0)
MCV: 88.8 fl (ref 78.0–100.0)
MONO ABS: 0.7 10*3/uL (ref 0.1–1.0)
Monocytes Relative: 8.5 % (ref 3.0–12.0)
Neutro Abs: 5.8 10*3/uL (ref 1.4–7.7)
Neutrophils Relative %: 72.8 % (ref 43.0–77.0)
Platelets: 243 10*3/uL (ref 150.0–400.0)
RBC: 4.46 Mil/uL (ref 4.22–5.81)
RDW: 13.9 % (ref 11.5–15.5)
WBC: 8 10*3/uL (ref 4.0–10.5)

## 2016-01-07 LAB — LIPID PANEL
Cholesterol: 87 mg/dL (ref 0–200)
HDL: 33 mg/dL — ABNORMAL LOW (ref >39.00)
LDL Cholesterol: 40 mg/dL (ref 0–99)
NONHDL: 53.98
Total CHOL/HDL Ratio: 3
Triglycerides: 70 mg/dL (ref 0.0–149.0)
VLDL: 14 mg/dL (ref 0.0–40.0)

## 2016-01-07 LAB — POCT URINALYSIS DIPSTICK
BILIRUBIN UA: NEGATIVE
Blood, UA: NEGATIVE
GLUCOSE UA: NEGATIVE
KETONES UA: NEGATIVE
Leukocytes, UA: NEGATIVE
Nitrite, UA: NEGATIVE
PROTEIN UA: NEGATIVE
SPEC GRAV UA: 1.01
Urobilinogen, UA: 0.2
pH, UA: 5.5

## 2016-01-07 LAB — BASIC METABOLIC PANEL
BUN: 22 mg/dL (ref 6–23)
CHLORIDE: 104 meq/L (ref 96–112)
CO2: 27 mEq/L (ref 19–32)
CREATININE: 1.15 mg/dL (ref 0.40–1.50)
Calcium: 9.2 mg/dL (ref 8.4–10.5)
GFR: 64.31 mL/min (ref >60.00)
GLUCOSE: 87 mg/dL (ref 70–99)
Potassium: 4.4 mEq/L (ref 3.5–5.1)
Sodium: 138 mEq/L (ref 135–145)

## 2016-01-07 LAB — MAGNESIUM: Magnesium: 2 mg/dL (ref 1.5–2.5)

## 2016-01-07 LAB — TSH: TSH: 2.21 u[IU]/mL (ref 0.35–4.50)

## 2016-01-07 MED ORDER — ATENOLOL 25 MG PO TABS: 25.0000 mg | ORAL_TABLET | Freq: Two times a day (BID) | ORAL | 3 refills | Status: DC

## 2016-01-07 MED ORDER — BUPROPION HCL ER (SR) 150 MG PO TB12: 150.0000 mg | ORAL_TABLET | Freq: Every day | ORAL | 4 refills | Status: DC

## 2016-01-07 MED ORDER — TAMSULOSIN HCL 0.4 MG PO CAPS: ORAL_CAPSULE | ORAL | 4 refills | Status: DC

## 2016-01-07 MED ORDER — ATORVASTATIN CALCIUM 20 MG PO TABS: ORAL_TABLET | ORAL | 4 refills | Status: DC

## 2016-01-07 NOTE — Progress Notes (Signed)
Pre visit review using our clinic review tool, if applicable. No additional management support is needed unless otherwise documented below in the visit note. 

## 2016-01-07 NOTE — Patient Instructions (Signed)
Continue current medications  We will set you up a CT scan to evaluate the lesion in your left kidney  Follow-up in one year sooner if any problems

## 2016-01-07 NOTE — Progress Notes (Signed)
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 fib, limit allergic rhinitis, coronary disease, mild depression, reflux esophagitis, and mild BPH with outlet obstruction and nocturia every 2 hours.  Because of the increased urination these held the Lasix. His blood pressure at home runs [id number] systolic with a diastolic in the 0000000 and [id number]. He had a CT scan of his chest done November 27 because of a pulmonary infiltrate that was persistent. He was diagnosed of pneumonia in September. They also note that program point a lung nodule. Results showed a 4.4 cm left kidney in the upper pole. They recommend a follow-up CT scan to be sure it's not a malignancy. Also noted to have a 4.2 cm a second week aortic aneurysm.  He gets routine eye care, dental care, colonoscopy no longer needed  Vaccinations up-to-date  He's been to see Dr. Oneida Alar for evaluation of pain in his left foot. That arches basically collapsed. He wants to avoid surgery. He using orthotics. Dr. Oneida Alar also gave him a prescription for tramadol. He would like to discuss taking that medication. He's due to follow-up in cardiology at the A. fib clinic however he's currently in sinus rhythm. He had one spell last week took an extra beta blocker and resolved.  14 point review of systems reviewed and otherwise negative  Physical evaluation.vs .BP 140/80 (BP Location: Right Arm, Patient Position: Sitting, Cuff Size: Normal)   Pulse 66   Temp 97.3 F (36.3 C) (Oral)   Ht 5' 9.25" (1.759 m)   Wt 154 lb 4.8 oz (70 kg)   SpO2 98%   BMI 22.62 kg/m  HEENT were negative neck was supple thyroid is not enlarged no carotid bruits cardiopulmonary exam normal abdominal exam normal extremities normal skin normal peripheral pulses normal arch collapsed right  foot  Impression intermittent A. fib continue current meds  History of mild depression continue Wellbutrin  #3 hypertension continue current meds  #4 reflux esophagitis continue Prilosec  #5 BPH with nocturia continue Flomax  #6 history of asthma continue medications on a when necessary basis as outlined by pulmonary.  #7 coronary disease asymptomatic  #8 collapsed arch right foot followed by Dr. Oneida Alar

## 2016-01-08 NOTE — Addendum Note (Signed)
Addended by: Elmon Kirschner A on: 01/08/2016 03:15 PM   Modules accepted: Orders

## 2016-01-09 ENCOUNTER — Ambulatory Visit
Admission: RE | Admit: 2016-01-09 | Discharge: 2016-01-09 | Disposition: A | Discharge status: Home / Self Care | Payer: Medicare Other | Source: Ambulatory Visit | Attending: Family Medicine | Admitting: Family Medicine

## 2016-01-09 ENCOUNTER — Other Ambulatory Visit: Payer: Medicare Other

## 2016-01-09 DIAGNOSIS — N2889 Other specified disorders of kidney and ureter: Secondary | ICD-10-CM | POA: Diagnosis not present

## 2016-01-09 DIAGNOSIS — N289 Disorder of kidney and ureter, unspecified: Secondary | ICD-10-CM

## 2016-01-09 MED ORDER — GADOBENATE DIMEGLUMINE 529 MG/ML IV SOLN
14.0000 mL | Freq: Once | INTRAVENOUS | Status: AC | PRN
  Administered 2016-01-09: 14 mL via INTRAVENOUS

## 2016-01-12 ENCOUNTER — Other Ambulatory Visit: Payer: Medicare Other

## 2016-01-14 ENCOUNTER — Encounter (HOSPITAL_COMMUNITY): Payer: Self-pay | Admitting: Nurse Practitioner

## 2016-01-14 ENCOUNTER — Ambulatory Visit (HOSPITAL_COMMUNITY)
Admission: RE | Admit: 2016-01-14 | Discharge: 2016-01-14 | Disposition: A | Discharge status: Home / Self Care | Payer: Medicare Other | Source: Ambulatory Visit | Attending: Nurse Practitioner | Admitting: Nurse Practitioner

## 2016-01-14 VITALS — BP 122/78 | HR 71 | Ht 69.0 in | Wt 157.6 lb

## 2016-01-14 DIAGNOSIS — F419 Anxiety disorder, unspecified: Secondary | ICD-10-CM | POA: Insufficient documentation

## 2016-01-14 DIAGNOSIS — Z8673 Personal history of transient ischemic attack (TIA), and cerebral infarction without residual deficits: Secondary | ICD-10-CM | POA: Insufficient documentation

## 2016-01-14 DIAGNOSIS — Z9049 Acquired absence of other specified parts of digestive tract: Secondary | ICD-10-CM | POA: Insufficient documentation

## 2016-01-14 DIAGNOSIS — E785 Hyperlipidemia, unspecified: Secondary | ICD-10-CM | POA: Diagnosis not present

## 2016-01-14 DIAGNOSIS — K219 Gastro-esophageal reflux disease without esophagitis: Secondary | ICD-10-CM | POA: Insufficient documentation

## 2016-01-14 DIAGNOSIS — Z96653 Presence of artificial knee joint, bilateral: Secondary | ICD-10-CM | POA: Diagnosis not present

## 2016-01-14 DIAGNOSIS — I2510 Atherosclerotic heart disease of native coronary artery without angina pectoris: Secondary | ICD-10-CM | POA: Insufficient documentation

## 2016-01-14 DIAGNOSIS — Z955 Presence of coronary angioplasty implant and graft: Secondary | ICD-10-CM | POA: Diagnosis not present

## 2016-01-14 DIAGNOSIS — I10 Essential (primary) hypertension: Secondary | ICD-10-CM | POA: Diagnosis not present

## 2016-01-14 DIAGNOSIS — I480 Paroxysmal atrial fibrillation: Secondary | ICD-10-CM | POA: Diagnosis not present

## 2016-01-14 DIAGNOSIS — K449 Diaphragmatic hernia without obstruction or gangrene: Secondary | ICD-10-CM | POA: Insufficient documentation

## 2016-01-14 DIAGNOSIS — I252 Old myocardial infarction: Secondary | ICD-10-CM | POA: Insufficient documentation

## 2016-01-14 DIAGNOSIS — F329 Major depressive disorder, single episode, unspecified: Secondary | ICD-10-CM | POA: Diagnosis not present

## 2016-01-14 DIAGNOSIS — M1990 Unspecified osteoarthritis, unspecified site: Secondary | ICD-10-CM | POA: Insufficient documentation

## 2016-01-14 MED ORDER — DOFETILIDE 250 MCG PO CAPS: 250.0000 ug | ORAL_CAPSULE | Freq: Two times a day (BID) | ORAL | 2 refills | Status: DC

## 2016-01-14 NOTE — Progress Notes (Signed)
Patient ID: [name], MD, male   DOB: 1929-09-03, 82 y.o.   MRN: [id number]     Primary Care Physician: [name], MD Referring Physician: Dr. Robyn Haber, MD is a 82 y.o. male with a h/o PAF on Tikosyn for f/u in the afib clinic. He has only noticed two breakthrough epiosodes,over the last two months, both have lasted around 8-10 hours. Usually will resolve with next dose of tikosyn/metoprolol. Has been staying active playing golf and has not had any issues with activity bringing on afib. He has not had any further symptoms of stroke and is doing well on eliquis without bleeding issues. QTc stable today. He is pleased with his current afib burden.  F/u in afib clinic 12/6. He has pneumonia treated with antibiotics and steroids early September and in this interim has 2-3 short spells of afib, which were quickly restored to SR with an additional dose of atenolol. He was deconditioned for a few weeks but feels his strength has returned. He had his bmet/mag checked with PCP recently and both were in range for tikosyn.  Today, he denies symptoms of palpitations, chest pain, shortness of breath, orthopnea, PND, lower extremity edema, dizziness, presyncope, syncope, or neurologic sequela. The patient is tolerating medications without difficulties and is otherwise without complaint today.   Past Medical History:  Diagnosis Date  . Allergic rhinitis   . Anxiety   . Asthma   . Atrial fibrillation ([place])   . CAD (coronary artery disease)   . Depression   . Diverticulosis 03/16/1995  . DJD (degenerative joint disease)    "most joints" (03/11/2015)  . GERD (gastroesophageal reflux disease) 12/30/2000  . Heart attack 1984  . Hiatal hernia 12/30/2000  . History of blood transfusion 1988   "related to GI bleeding OR"  . History of duodenal ulcer 10/15/1986  . Hyperlipidemia   . Hypertension   . Sinus bradycardia   . Stroke [facility])    Past Surgical History:  Procedure Laterality  Date  . CARDIAC CATHETERIZATION    . CATARACT EXTRACTION W/ INTRAOCULAR LENS  IMPLANT, BILATERAL Bilateral ?2013  . CHOLECYSTECTOMY OPEN  1989  . CORONARY ANGIOPLASTY WITH STENT PLACEMENT  2010  . hospital ccu  1984   heart attack, hypoplastic right coronary  . INGUINAL HERNIA REPAIR Right ~ 1990  . NASAL SEPTUM SURGERY  1972  . TONSILLECTOMY    . TOTAL KNEE ARTHROPLASTY Bilateral 1998-2004   "right-left"  . VAGOTOMY AND PYLOROPLASTY  1988   "bleeding duodenal ulcer"    Current Outpatient Prescriptions  Medication Sig Dispense Refill  . atenolol (TENORMIN) 25 MG tablet Take 25 mg by mouth 2 (two) times daily.    Marland Kitchen atorvastatin (LIPITOR) 20 MG tablet TAKE 1 TABLET(20 MG) BY MOUTH DAILY 90 tablet 4  . beclomethasone (BECONASE-AQ) 42 MCG/SPRAY nasal spray Place 1 spray into both nostrils 2 (two) times daily. Dose is for each nostril.    Marland Kitchen buPROPion (WELLBUTRIN SR) 150 MG 12 hr tablet Take 1 tablet (150 mg total) by mouth daily. 100 tablet 4  . cetirizine (ZYRTEC) 10 MG tablet Take 10 mg by mouth daily with supper.     . Cholecalciferol (VITAMIN D) 2000 units tablet Take 2,000 Units by mouth daily.    Marland Kitchen dofetilide (TIKOSYN) 250 MCG capsule Take 1 capsule (250 mcg total) by mouth 2 (two) times daily. 180 capsule 2  . ELIQUIS 5 MG TABS tablet TAKE 1 TABLET(5 MG) BY MOUTH TWICE DAILY 60  tablet 6  . Fluticasone Furoate (ARNUITY ELLIPTA) 200 MCG/ACT AEPB Inhale 1 puff into the lungs daily. 30 each 11  . furosemide (LASIX) 20 MG tablet Take 1 tablet (20 mg total) by mouth daily as needed (ankle swelling). 100 tablet 3  . guaiFENesin (MUCINEX) 600 MG 12 hr tablet Take 1 tablet (600 mg total) by mouth 2 (two) times daily. (Patient taking differently: Take 600 mg by mouth 2 (two) times daily as needed. ) 30 tablet 0  . levalbuterol (XOPENEX HFA) 45 MCG/ACT inhaler Inhale 1 puff into the lungs every 4 (four) hours as needed for wheezing or shortness of breath.    . nitroGLYCERIN (NITROSTAT) 0.4 MG SL  tablet Place 1 tablet (0.4 mg total) under the tongue every 5 (five) minutes x 3 doses as needed for chest pain. 25 tablet 2  . omeprazole (PRILOSEC OTC) 20 MG tablet Take 1 tablet (20 mg total) by mouth daily. (Patient taking differently: Take 20 mg by mouth daily with supper. ) 100 tablet 4  . PROAIR HFA 108 (90 Base) MCG/ACT inhaler INHALE 2 PUFFS INTO THE LUNGS EVERY 6 HOURS AS NEEDED FOR WHEEZING 8.5 g 5  . tamsulosin (FLOMAX) 0.4 MG CAPS capsule TAKE ONE CAPSULE BY MOUTH DAILY 100 capsule 4  . traMADol (ULTRAM) 50 MG tablet Take 1 tablet (50 mg total) by mouth every 12 (twelve) hours as needed. 60 tablet 2   No current facility-administered medications for this encounter.     Allergies  Allergen Reactions  . Advil [Ibuprofen] Nausea Only and Rash  . Asa [Aspirin] Rash  . Tylenol [Acetaminophen] Rash    Social History   Social History  . Marital status: Married    Spouse name: N/A  . Number of children: 2  . Years of education: N/A   Occupational History  . DOCTOR Retired   Social History Main Topics  . Smoking status: Never Smoker  . Smokeless tobacco: Never Used  . Alcohol use No  . Drug use: No  . Sexual activity: Not Currently   Other Topics Concern  . Not on file   Social History Narrative   Married. Regular exercise - yes.     Family History  Problem Relation Age of Onset  . Coronary artery disease Mother     deceased  . Deep vein thrombosis Sister   . Coronary artery disease Brother   . Asthma Brother   . Colon cancer Neg Hx     ROS- All systems are reviewed and negative except as per the HPI above  Physical Exam: Vitals:   01/14/16 0904  BP: 122/78  Pulse: 71  Weight: 157 lb 9.6 oz (71.5 kg)  Height: 5\' 9"  (1.753 m)    GEN- The patient is well appearing, alert and oriented x 3 today.   Head- normocephalic, atraumatic Eyes-  Sclera clear, conjunctiva pink Ears- hearing intact Oropharynx- clear Neck- supple, no JVP Lymph- no cervical  lymphadenopathy Lungs- Clear to ausculation bilaterally, normal work of breathing Heart- Regular rate and rhythm, no murmurs, rubs or gallops, PMI not laterally displaced GI- soft, NT, ND, + BS Extremities- no clubbing, cyanosis, or edema MS- no significant deformity or atrophy Skin- no rash or lesion Psych- euthymic mood, full affect Neuro- strength and sensation are intact  EKG-NSR at 71 bpm, pr int 158 ms, qrs int 72 ms, qtc 439 ms Epic records reviewed  Assessment and Plan: 1. PAF Doing well maintaining SR with tikosyn, low afib burden, more recent with  Pneumonia in September, now quiet Continue atenolol, with extra dose as needed for afib episodes  Continue eliquis, at current dose, recent labs, 11/29, creat 1.15, k+ 4.4, mag 2.0   2. Previous CVA Doing well, no more nuero symptoms Taking eliquis without fail  F/u with Dr. Aundra Dubin, next appointment pending in March, will defer f/u of tikosyn to him, which pt states he sees every 3 months afib clinic as needed  Butch Penny C. Chantrell Apsey, [facility] [address] [place], [place] [address] [phone]

## 2016-01-15 ENCOUNTER — Telehealth: Payer: Self-pay | Admitting: Internal Medicine

## 2016-01-15 DIAGNOSIS — R918 Other nonspecific abnormal finding of lung field: Secondary | ICD-10-CM

## 2016-01-15 NOTE — Telephone Encounter (Signed)
     IMPRESSION: 1. Scattered peribronchovascular ground-glass and consolidation with some areas of mild surrounding architectural distortion in the right middle lobe, lingula and left lower lobe. Findings are likely due to bronchopneumonia with areas of healing/developing scar. Consider additional follow-up CT chest without contrast in 4-6 weeks to document continued resolution as an underlying nodule cannot be definitively excluded. -> elise let [name], MD, know that there is healing pneumonia. Do repeat CT chest wo contrast 6 weeks  2. Nodular density projecting over the lower left hemi thorax on 12/03/2015 has no CT correlate. 3. Low-attenuation lesions in the left kidney with areas of thin peripheral/septal calcification, incompletely imaged. Further evaluation with pre and post contrast MRI or CT should be considered. MRI is preferred in younger patients (due to lack of ionizing radiation) and for evaluating calcified lesion(s). 4. Ascending aortic aneurysm. Recommend annual imaging followup by CTA or MRA. This recommendation follows 2010 ACCF/AHA/AATS/ACR/ASA/SCA/SCAI/SIR/STS/SVM Guidelines for the Diagnosis and Management of Patients with Thoracic Aortic Disease. Circulation. 2010; 121: e266-e369 5. Aortic atherosclerosis (ICD10-170.0). Three-vessel coronary artery calcification. 6. Enlarged pulmonary arteries, indicative of pulmonary arterial hypertension.   Electronically Signed By: Lorin Picket M.D. On: 01/05/2016 09:42

## 2016-01-15 NOTE — Telephone Encounter (Signed)
Called and spoke to pt. Informed him of the results and recs per MR. Pt verbalized understanding and states he would rather have a CXR and would like to have MR's opinion.   MR please advise. Thanks.

## 2016-01-16 NOTE — Telephone Encounter (Signed)
CXR order and CT canceled. Pt aware & voiced understanding. Nothing further needed.

## 2016-01-16 NOTE — Telephone Encounter (Signed)
okl to go with cxr  Dr. Brand Males, M.D., [facility].C.P Pulmonary and Critical Care Medicine Staff Physician [facility] Pager: [phone], If no answer or between  15:00h - 7:00h: call [phone]  01/16/2016 8:13 AM

## 2016-02-03 ENCOUNTER — Other Ambulatory Visit: Payer: Self-pay | Admitting: Family Medicine

## 2016-02-03 DIAGNOSIS — R351 Nocturia: Secondary | ICD-10-CM

## 2016-02-03 DIAGNOSIS — J45909 Unspecified asthma, uncomplicated: Secondary | ICD-10-CM

## 2016-02-03 DIAGNOSIS — N401 Enlarged prostate with lower urinary tract symptoms: Secondary | ICD-10-CM

## 2016-02-11 ENCOUNTER — Ambulatory Visit: Payer: Self-pay

## 2016-02-11 ENCOUNTER — Encounter: Payer: Self-pay | Admitting: Sports Medicine

## 2016-02-11 ENCOUNTER — Ambulatory Visit (INDEPENDENT_AMBULATORY_CARE_PROVIDER_SITE_OTHER): Payer: PPO | Admitting: Sports Medicine

## 2016-02-11 VITALS — BP 109/64 | HR 67 | Ht 69.5 in | Wt 160.0 lb

## 2016-02-11 DIAGNOSIS — M79671 Pain in right foot: Secondary | ICD-10-CM | POA: Insufficient documentation

## 2016-02-11 DIAGNOSIS — M25571 Pain in right ankle and joints of right foot: Secondary | ICD-10-CM | POA: Diagnosis not present

## 2016-02-11 NOTE — Progress Notes (Signed)
  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   [name] is an 83 year old is following up for his right ankle pain. He was placed in custom orthotics in November. Since that time he has some lateral hindfoot pain over his peroneals when he is playing golf. He also has medial hindfoot pain over his posterior tibialis tendon. He denies any injury during this time. The pain only occurs after he has been walking. He denies any pain at rest. He is going to be playing a lot of golf in [place] in a couple of weeks and would like to have some just is made to his orthotics.  ROS: No unexpected weight loss, fever, chills, swelling, instability, muscle pain, numbness/tingling, redness, otherwise see HPI    HISTORY: Past Medical, Surgical, Social, and Family History Reviewed & Updated per EMR.   Pertinent Historical Findings include: PMSHx -  Afib  DATA REVIEWED: none  PHYSICAL EXAM:  VS: BP:109/64  HR:67bpm  TEMP: ( )  RESP:   HT:5' 9.5" (176.5 cm)   WT:160 lb (72.6 kg)  BMI:23.3 PHYSICAL EXAM: Gen: NAD, alert, cooperative with exam, well-appearing HEENT: clear conjunctiva, EOMI CV:  no edema, capillary refill brisk,  Resp: non-labored, normal speech Skin: no rashes, normal turgor  Neuro: no gross deficits.  Psych:  alert and oriented Right Foot:  No tenderness to palpation over the posterior tib or peroneal tendon. Slight effusion of the posterior tib and its insertion. Significant breakdown of the rear midfoot and forefoot on the right. Able to rise up on his tiptoes on the right but not as high compared to the left. Some weakness to resistance with inversion and even version compared to the left. Normal plantar and dorsal flexion. Neurovascular intact.  Limited ultrasound: Right ankle: The posterior tib tendon was viewed in short axis and appears to have a partial tear upon viewing at the lateral malleolus. The  posterior tib tendon looks normal when viewed more proximal. The posterior tib tendon appears to send as it runs through the tarsal tunnel and around the malleolus. There is a hypoechoic change at its insertion on the navicular to suggest this effusion. There is some calcifications observed near its insertion at the navicular.  Summary: The findings are consistent with a posterior tibialis tendinopathy as well as a partial tear.  ASSESSMENT & PLAN:   Right foot pain He has significant breakdown of his right foot and has had custom orthotics made. Most likely his chronic breakdowns are affecting his posterior tib tendon. He elected not to pursue nitroglycerin therapy at this point. A three-quarter length heel wedge was placed on the right side in the medial to lateral heel wedge was removed. He will follow-up to make any adjustments to his orthotics prior to going to [place] if needed.

## 2016-02-11 NOTE — Assessment & Plan Note (Signed)
He has significant breakdown of his right foot and has had custom orthotics made. Most likely his chronic breakdowns are affecting his posterior tib tendon. He elected not to pursue nitroglycerin therapy at this point. A three-quarter length heel wedge was placed on the right side in the medial to lateral heel wedge was removed. He will follow-up to make any adjustments to his orthotics prior to going to [place] if needed.

## 2016-03-02 ENCOUNTER — Other Ambulatory Visit: Payer: Self-pay | Admitting: Emergency Medicine

## 2016-03-03 ENCOUNTER — Ambulatory Visit
Admission: RE | Admit: 2016-03-03 | Discharge: 2016-03-03 | Disposition: A | Discharge status: Home / Self Care | Payer: PPO | Source: Ambulatory Visit | Attending: Family Medicine | Admitting: Family Medicine

## 2016-03-03 ENCOUNTER — Other Ambulatory Visit: Payer: Self-pay | Admitting: Emergency Medicine

## 2016-03-03 DIAGNOSIS — M25522 Pain in left elbow: Secondary | ICD-10-CM

## 2016-03-03 DIAGNOSIS — M7022 Olecranon bursitis, left elbow: Secondary | ICD-10-CM | POA: Diagnosis not present

## 2016-03-03 MED ORDER — METHYLPREDNISOLONE ACETATE 40 MG/ML INJ SUSP (RADIOLOG: 40.0000 mg | Freq: Once | INTRAMUSCULAR | Status: DC

## 2016-03-13 DIAGNOSIS — I10 Essential (primary) hypertension: Secondary | ICD-10-CM | POA: Diagnosis not present

## 2016-03-13 DIAGNOSIS — L089 Local infection of the skin and subcutaneous tissue, unspecified: Secondary | ICD-10-CM | POA: Diagnosis not present

## 2016-03-13 DIAGNOSIS — S81802A Unspecified open wound, left lower leg, initial encounter: Secondary | ICD-10-CM | POA: Diagnosis not present

## 2016-03-13 DIAGNOSIS — S81801A Unspecified open wound, right lower leg, initial encounter: Secondary | ICD-10-CM | POA: Diagnosis not present

## 2016-03-13 DIAGNOSIS — X58XXXA Exposure to other specified factors, initial encounter: Secondary | ICD-10-CM | POA: Diagnosis not present

## 2016-03-17 ENCOUNTER — Encounter: Payer: Self-pay | Admitting: Family Medicine

## 2016-03-17 ENCOUNTER — Ambulatory Visit (INDEPENDENT_AMBULATORY_CARE_PROVIDER_SITE_OTHER): Payer: PPO | Admitting: Family Medicine

## 2016-03-17 VITALS — BP 104/60 | HR 63 | Temp 97.8°F | Ht 69.0 in | Wt 158.8 lb

## 2016-03-17 DIAGNOSIS — L03818 Cellulitis of other sites: Secondary | ICD-10-CM

## 2016-03-17 MED ORDER — CEFTRIAXONE SODIUM 1 G IJ SOLR
1.0000 g | Freq: Once | INTRAMUSCULAR | Status: AC
  Administered 2016-03-17: 1 g via INTRAMUSCULAR

## 2016-03-17 NOTE — Progress Notes (Signed)
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 They found 3 mL of clear fluid. He had steroids and Marcaine injected. He then went to [place]. 2 days later he noticed some redness on the upper part of his arm. Then the following Tuesday note is not on his left anterior shin. The following Friday he put up bacon poultry [place] on his left anterior lower extremity to draw out the pus. The following day went to an urgent care and was told he had cellulitis. He was given Keflex 500 4 times a day and Bactrim twice a day. He comes in today for follow-up saying he is a little bit better but not much. No fever chills.  He also has an abrasion on his left medial wrist.  BP 104/60 (BP Location: Right Arm, Patient Position: Sitting, Cuff Size: Normal)   Pulse 63   Temp 97.8 F (36.6 C) (Oral)   Ht 5\' 9"  (1.753 m)   Wt 158 lb 12.8 oz (72 kg)   BMI 23.45 kg/m  Examination left wrist shows an abrasion. It was redressed. Examination left lower extremity shows a boil with 2 areas were pus is draining.  Examination left upper extremity shows erythema and a woody type appearance left posterior upper extremity. He is able to move his elbow without difficulty.  #1 abscess left anterior lower extremity  #2 cellulitis left upper extremity  #3 abrasion left wrist  Plan...........Marland Kitchen 1 g of Rocephin IM now......... continue the Bactrim twice a day in the Keflex 4 times daily  Follow-up tomorrow at noon.  Cautioned if he developed fever and chills to, immediately the hospital to be admitted for IV antibiotics.

## 2016-03-17 NOTE — Progress Notes (Signed)
Pre visit review using our clinic review tool, if applicable. No additional management support is needed unless otherwise documented below in the visit note. 

## 2016-03-17 NOTE — Patient Instructions (Signed)
Rest at home  Soak your left leg in the tub tonight and change the dressing as we discussed,,,,,,, also soak and change it in the morning  Return tomorrow at noon for follow-up  Elevate your left leg in your left arm.  Wrap a  towel around your left arm and put heat pad on .......... low heat  If you develop chills or fever come directly to the hospital for admission and IV antibiotics

## 2016-03-18 ENCOUNTER — Encounter: Payer: Self-pay | Admitting: Family Medicine

## 2016-03-18 ENCOUNTER — Ambulatory Visit (INDEPENDENT_AMBULATORY_CARE_PROVIDER_SITE_OTHER): Payer: PPO | Admitting: Family Medicine

## 2016-03-18 DIAGNOSIS — L03116 Cellulitis of left lower limb: Secondary | ICD-10-CM | POA: Diagnosis not present

## 2016-03-18 DIAGNOSIS — L03114 Cellulitis of left upper limb: Secondary | ICD-10-CM

## 2016-03-18 MED ORDER — CEPHALEXIN 500 MG PO CAPS: ORAL_CAPSULE | ORAL | 1 refills | Status: DC

## 2016-03-18 MED ORDER — DOXYCYCLINE HYCLATE 100 MG PO TABS: 100.0000 mg | ORAL_TABLET | Freq: Two times a day (BID) | ORAL | 0 refills | Status: DC

## 2016-03-18 MED ORDER — SULFAMETHOXAZOLE-TRIMETHOPRIM 800-160 MG PO TABS: 1.0000 | ORAL_TABLET | Freq: Two times a day (BID) | ORAL | 1 refills | Status: DC

## 2016-03-18 NOTE — Progress Notes (Signed)
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 See details of that note. In summary the cellulitis occurred after he had an aspiration and injection of steroids in his left olecranon bursa. The swelling of his upper arm occurred about 2 days after the aspiration injection.  He saw him yesterday and he had marked edema of the left biceps triceps area and some slight effusion in that left olecranon bursa. He also abscess left anterior tibia the drained spontaneously.  We discussed various options which included outpatient versus inpatient antibiotics. Elected to try outpatient. We gave him a gram of Rocephin IM kept him on his oral meds which were Keflex 2 g daily along with Bactrim twice a day. He comes back today for follow-up.  No fever chills. He feels the redness and swelling is diminished  Vital signs stable he is afebrile  Examination of left upper arm shows decreased swelling and redness. The left olecranon bursa is much more swollen today than it was yesterday. The left wrist wound was checked and it's healing that was an abrasion. The left lower anterior tibial area was also examined. There is some slight pus draining. Culture was taken  Because of increased swelling of the left olecranon bursa he was taken to the treatment room. After sterile Betadine prep 30 mL of pus were drained from the left olecranon bursa. It was sent for culture.  Impression cellulitis left upper extremity with infection in the left bursa along with a soft tissue infection left lower anterior tibial area. Improved on oral antibiotics in the IM Rocephin.  Plan continue Bactrim and Keflex and doxycycline follow-up in 24 hours

## 2016-03-18 NOTE — Patient Instructions (Signed)
Continue to keep your left leg and left arm elevated as much as possible  Septra DS......... one twice daily  Doxycycline 100 mg.....Marland Kitchen one twice daily  Keflex 500 mg........ 2 tabs twice daily  Return tomorrow at 9 AM for follow-up

## 2016-03-19 ENCOUNTER — Encounter: Payer: Self-pay | Admitting: Internal Medicine

## 2016-03-19 ENCOUNTER — Ambulatory Visit (INDEPENDENT_AMBULATORY_CARE_PROVIDER_SITE_OTHER): Payer: PPO | Admitting: Internal Medicine

## 2016-03-19 VITALS — BP 138/82 | HR 84 | Temp 97.9°F

## 2016-03-19 DIAGNOSIS — L03114 Cellulitis of left upper limb: Secondary | ICD-10-CM

## 2016-03-19 NOTE — Progress Notes (Signed)
Subjective:    Patient ID: [name] Spence, MD, male    DOB: 1929-02-26, 83 y.o.   MRN: [id number]  HPI  83 year old patient who is seen today in follow-up. He has been followed closely due to an infected left olecranon bursa associated with cellulitis of the left arm.  He was seen yesterday and the left olecranon bursa drained and sent for culture which is pending. He is now on triple antibiotic therapy pending results of culture and sensitivity He also suffered an abrasion injury to the anterior lower leg about 1 week ago. This also has been associated with some secondary cellulitis and also seems to be improving. He denies any fever, chills, or constitutional complaints  He is returning from a trip to [place]  where he was able to play golf.  Past Medical History:  Diagnosis Date  . Allergic rhinitis   . Anxiety   . Asthma   . Atrial fibrillation ([place])   . CAD (coronary artery disease)   . Depression   . Diverticulosis 03/16/1995  . DJD (degenerative joint disease)    "most joints" (03/11/2015)  . GERD (gastroesophageal reflux disease) 12/30/2000  . Heart attack 1984  . Hiatal hernia 12/30/2000  . History of blood transfusion 1988   "related to GI bleeding OR"  . History of duodenal ulcer 10/15/1986  . Hyperlipidemia   . Hypertension   . Sinus bradycardia   . Stroke [facility])      Social History   Social History  . Marital status: Married    Spouse name: N/A  . Number of children: 2  . Years of education: N/A   Occupational History  . DOCTOR Retired   Social History Main Topics  . Smoking status: Never Smoker  . Smokeless tobacco: Never Used  . Alcohol use No  . Drug use: No  . Sexual activity: Not Currently   Other Topics Concern  . Not on file   Social History Narrative   Married. Regular exercise - yes.     Past Surgical History:  Procedure Laterality Date  . CARDIAC CATHETERIZATION    . CATARACT EXTRACTION W/ INTRAOCULAR LENS  IMPLANT, BILATERAL  Bilateral ?2013  . CHOLECYSTECTOMY OPEN  1989  . CORONARY ANGIOPLASTY WITH STENT PLACEMENT  2010  . hospital [facility]  1984   heart attack, hypoplastic right coronary  . INGUINAL HERNIA REPAIR Right ~ 1990  . NASAL SEPTUM SURGERY  1972  . TONSILLECTOMY    . TOTAL KNEE ARTHROPLASTY Bilateral 1998-2004   "right-left"  . VAGOTOMY AND PYLOROPLASTY  1988   "bleeding duodenal ulcer"    Family History  Problem Relation Age of Onset  . Coronary artery disease Mother     deceased  . Deep vein thrombosis Sister   . Coronary artery disease Brother   . Asthma Brother   . Colon cancer Neg Hx     Allergies  Allergen Reactions  . Advil [Ibuprofen] Nausea Only and Rash  . Asa [Aspirin] Rash  . Tylenol [Acetaminophen] Rash    Current Outpatient Prescriptions on File Prior to Visit  Medication Sig Dispense Refill  . atenolol (TENORMIN) 25 MG tablet Take 25 mg by mouth 2 (two) times daily.    Marland Kitchen atorvastatin (LIPITOR) 20 MG tablet TAKE 1 TABLET(20 MG) BY MOUTH DAILY 90 tablet 4  . beclomethasone (BECONASE-AQ) 42 MCG/SPRAY nasal spray Place 1 spray into both nostrils 2 (two) times daily. Dose is for each nostril.    Marland Kitchen buPROPion [facility] SR) 150  MG 12 hr tablet Take 1 tablet (150 mg total) by mouth daily. 100 tablet 4  . cephALEXin (KEFLEX) 500 MG capsule 2 tabs twice a day 40 capsule 1  . cetirizine (ZYRTEC) 10 MG tablet Take 10 mg by mouth daily with supper.     . Cholecalciferol (VITAMIN D) 2000 units tablet Take 2,000 Units by mouth daily.    Marland Kitchen dofetilide (TIKOSYN) 250 MCG capsule Take 1 capsule (250 mcg total) by mouth 2 (two) times daily. 180 capsule 2  . doxycycline (VIBRA-TABS) 100 MG tablet Take 1 tablet (100 mg total) by mouth 2 (two) times daily. 20 tablet 0  . ELIQUIS 5 MG TABS tablet TAKE 1 TABLET(5 MG) BY MOUTH TWICE DAILY 60 tablet 6  . Fluticasone Furoate (ARNUITY ELLIPTA) 200 MCG/ACT AEPB Inhale 1 puff into the lungs daily. 30 each 11  . furosemide (LASIX) 20 MG tablet Take 1  tablet (20 mg total) by mouth daily as needed (ankle swelling). 100 tablet 3  . guaiFENesin (MUCINEX) 600 MG 12 hr tablet Take 1 tablet (600 mg total) by mouth 2 (two) times daily. (Patient taking differently: Take 600 mg by mouth 2 (two) times daily as needed. ) 30 tablet 0  . levalbuterol (XOPENEX HFA) 45 MCG/ACT inhaler Inhale 1 puff into the lungs every 4 (four) hours as needed for wheezing or shortness of breath.    . nitroGLYCERIN (NITROSTAT) 0.4 MG SL tablet Place 1 tablet (0.4 mg total) under the tongue every 5 (five) minutes x 3 doses as needed for chest pain. 25 tablet 2  . omeprazole (PRILOSEC OTC) 20 MG tablet Take 1 tablet (20 mg total) by mouth daily. (Patient taking differently: Take 20 mg by mouth daily with supper. ) 100 tablet 4  . PROAIR HFA 108 (90 Base) MCG/ACT inhaler INHALE 2 PUFFS INTO THE LUNGS EVERY 6 HOURS AS NEEDED FOR WHEEZING 8.5 g 5  . sulfamethoxazole-trimethoprim (BACTRIM DS,SEPTRA DS) 800-160 MG tablet Take 1 tablet by mouth 2 (two) times daily. 20 tablet 1  . tamsulosin (FLOMAX) 0.4 MG CAPS capsule TAKE ONE CAPSULE BY MOUTH DAILY 100 capsule 4  . tamsulosin (FLOMAX) 0.4 MG CAPS capsule TAKE 1 CAPSULE BY MOUTH DAILY 100 capsule 0  . traMADol (ULTRAM) 50 MG tablet Take 1 tablet (50 mg total) by mouth every 12 (twelve) hours as needed. 60 tablet 2   No current facility-administered medications on file prior to visit.     BP 138/82 (BP Location: Right Arm, Patient Position: Sitting, Cuff Size: Normal)   SpO2 95%    Review of Systems  Constitutional: Positive for activity change, appetite change, chills, diaphoresis, fatigue and fever.  HENT: Negative for congestion, dental problem, ear pain, hearing loss, sore throat, tinnitus, trouble swallowing and voice change.   Eyes: Negative for pain, discharge and visual disturbance.  Respiratory: Negative for cough, chest tightness, wheezing and stridor.   Cardiovascular: Negative for chest pain, palpitations and leg  swelling.  Gastrointestinal: Negative for abdominal distention, abdominal pain, blood in stool, constipation, diarrhea, nausea and vomiting.  Genitourinary: Negative for difficulty urinating, discharge, flank pain, genital sores, hematuria and urgency.  Musculoskeletal: Negative for arthralgias, back pain, gait problem, joint swelling, myalgias and neck stiffness.  Skin: Positive for rash.  Neurological: Negative for dizziness, syncope, speech difficulty, weakness, numbness and headaches.  Hematological: Negative for adenopathy. Does not bruise/bleed easily.  Psychiatric/Behavioral: Negative for behavioral problems and dysphoric mood. The patient is not nervous/anxious.        Objective:  Physical Exam  Constitutional: He appears well-developed and well-nourished. No distress.  Afebrile.  No distress   Skin:  The left bursa had small amount of effusion.  There is no significant tenderness or redness.  Patient had considerable redness and soft tissue swelling involving the upper and lower arm. No axillary adenopathy  The patient had a abrasion injury involving his proximal anterior lower leg.  There was surrounding erythema but no tenderness or fluctuance          Assessment & Plan:   Status post infected left olecranon bursitis with secondary cellulitis.  The left olecranon bursa was aspirated yesterday and sent for culture and sensitivities which are pending.  We'll continue present triple antibiotic regimen at this time Status post abrasion injury, left anterior lower leg with secondary cellulitis.  This also seems to be improving.  We'll continue antibiotic therapy.  Local wound care and attempt to keep elevated  Review culture and sensitivities We'll check with patient next week for progress  Nyoka Cowden

## 2016-03-19 NOTE — Progress Notes (Signed)
Pre visit review using our clinic review tool, if applicable. No additional management support is needed unless otherwise documented below in the visit note. 

## 2016-03-19 NOTE — Patient Instructions (Signed)
Take your antibiotic as prescribed until ALL of it is gone, but stop if you develop a rash, swelling, or any side effects of the medication.  Contact our office as soon as possible if  there are side effects of the medication.  Call if you develop fever, chills or other constitutional symptoms  Call if you develop worsening pain involving either the left arm or left leg, worsening redness or swelling  Attempt acute left arm and left leg elevated as much as possible  Continue local wound care  Call or return to clinic prn if these symptoms worsen or fail to improve as anticipated.

## 2016-03-22 ENCOUNTER — Ambulatory Visit (INDEPENDENT_AMBULATORY_CARE_PROVIDER_SITE_OTHER): Payer: PPO | Admitting: Family Medicine

## 2016-03-22 ENCOUNTER — Encounter: Payer: Self-pay | Admitting: Family Medicine

## 2016-03-22 DIAGNOSIS — L03319 Cellulitis of trunk, unspecified: Secondary | ICD-10-CM

## 2016-03-22 LAB — WOUND CULTURE
GRAM STAIN: NONE SEEN
Gram Stain: NONE SEEN
Gram Stain: NONE SEEN
Gram Stain: NONE SEEN

## 2016-03-22 MED ORDER — LORAZEPAM 1 MG PO TABS: ORAL_TABLET | ORAL | 3 refills | Status: DC

## 2016-03-22 MED ORDER — DOXYCYCLINE HYCLATE 100 MG PO TABS: 100.0000 mg | ORAL_TABLET | Freq: Two times a day (BID) | ORAL | 2 refills | Status: DC

## 2016-03-22 NOTE — Progress Notes (Signed)
Pre visit review using our clinic review tool, if applicable. No additional management support is needed unless otherwise documented below in the visit note. 

## 2016-03-22 NOTE — Patient Instructions (Signed)
Continue the warm soaks and daily dressing changes...........Marland KitchenMarland KitchenLeft lower extremity  Continue the doxycycline and Septra.......... One of each twice daily  Return in one week for follow-up......... Sooner if any problems

## 2016-03-22 NOTE — Progress Notes (Signed)
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 Raliegh Ip. At that time things were improving therefore no change in medication was initiated. Over the weekend the left elbow swelled a little more and now it's almost completely gone. We did drain it. Culture showed MRSA.  Overall he says he feels better and no complications from his medicines  Vital signs stable he is afebrile  The left lower extremities much improved. The wrist is much improved. The left upper arm the erythema is almost completely gone there still a little bit of tenderness on that left elbow area and I recommend that palpate any effusion.  Cellulitis left upper extremity.........Marland Kitchen MRSA......... stop the Keflex continue the doxycycline and Septra return in one week for follow-up sooner if any problems

## 2016-03-29 ENCOUNTER — Ambulatory Visit (INDEPENDENT_AMBULATORY_CARE_PROVIDER_SITE_OTHER): Payer: PPO | Admitting: Family Medicine

## 2016-03-29 DIAGNOSIS — L03114 Cellulitis of left upper limb: Secondary | ICD-10-CM

## 2016-03-29 NOTE — Patient Instructions (Signed)
Continue the doxycycline and Septra,,,,,,,,,,,,,,,,,, one of each twice daily for 7-10 days until the lesion on the left lower extremity completely closes.  Return when necessary,

## 2016-03-29 NOTE — Progress Notes (Signed)
[name] [name] is a 83 year old married male nonsmoker who comes in today for follow-up of cellulitis  We been treating him now for the past 3-1/2 weeks. The initial site of infection was his left olecranon bursa. He had a steroid injection with fluid drained from the bursa because he developed a fairly large bursitis with effusion. 2 days after that he developed some redness of his left upper arm. See details of note  Today comes in for follow-up. He's been on doxycycline and Septra twice a day. Culture grew out MRSA. Says he feels well no complaints. The left elbow is normal the left wrist where he had an abrasion is healing and the left lower extremity where he had the lesion is now almost completely resolved except for 2 mm area with a skin is remained open. No pus draining.  Impression,,,,, MRSA .........Marland Kitchen Resolving,,,,,,,, because of the persistence of the lower extremity lesion is not 100% healed recommend he continue the Doxy and Septra for 7-10 more days.

## 2016-03-29 NOTE — Progress Notes (Signed)
Pre visit review using our clinic review tool, if applicable. No additional management support is needed unless otherwise documented below in the visit note. 

## 2016-04-02 ENCOUNTER — Telehealth: Payer: Self-pay | Admitting: Internal Medicine

## 2016-04-02 ENCOUNTER — Ambulatory Visit (INDEPENDENT_AMBULATORY_CARE_PROVIDER_SITE_OTHER)
Admission: RE | Admit: 2016-04-02 | Discharge: 2016-04-02 | Disposition: A | Discharge status: Home / Self Care | Payer: PPO | Source: Ambulatory Visit | Attending: Internal Medicine | Admitting: Internal Medicine

## 2016-04-02 DIAGNOSIS — J180 Bronchopneumonia, unspecified organism: Secondary | ICD-10-CM | POA: Diagnosis not present

## 2016-04-02 DIAGNOSIS — R918 Other nonspecific abnormal finding of lung field: Secondary | ICD-10-CM

## 2016-04-02 NOTE — Telephone Encounter (Signed)
Please let Dr Caryn Section know that cxr is clear. Please let him know to keep fu 06/07/16  Dr. Brand Males, M.D., [facility].C.P Pulmonary and Critical Care Medicine Staff Physician [facility] Pager: [phone], If no answer or between  15:00h - 7:00h: call [phone]  04/02/2016 2:03 PM       Dg Chest 2 View  Result Date: 04/02/2016 CLINICAL DATA:  83 year old male with bronchopneumonia in November 2017. Subsequent encounter. EXAM: CHEST  2 VIEW COMPARISON:  Chest CT 01/05/2016.  Chest radiographs 12/03/2015. FINDINGS: Left lung base and lingula ventilation is improved. Today there is no pneumothorax, pulmonary edema, pleural effusion or confluent pulmonary opacity. Stable cardiac size and mediastinal contours. Calcified aortic atherosclerosis. Visualized tracheal air column is within normal limits. No acute osseous abnormality identified. Negative visible bowel gas pattern. Numerous upper abdominal surgical clips again noted. IMPRESSION: 1. Previous bilateral bronchopneumonia appears resolved. No acute cardiopulmonary abnormality. 2.  Calcified aortic atherosclerosis. Electronically Signed   By: Genevie Ann M.D.   On: 04/02/2016 12:01

## 2016-04-02 NOTE — Telephone Encounter (Signed)
Pt aware of results and voiced his understanding. Nothing further needed.  

## 2016-04-12 ENCOUNTER — Encounter: Payer: Self-pay | Admitting: Sports Medicine

## 2016-04-12 ENCOUNTER — Ambulatory Visit (INDEPENDENT_AMBULATORY_CARE_PROVIDER_SITE_OTHER): Payer: PPO | Admitting: Sports Medicine

## 2016-04-12 DIAGNOSIS — M25579 Pain in unspecified ankle and joints of unspecified foot: Secondary | ICD-10-CM

## 2016-04-12 DIAGNOSIS — R269 Unspecified abnormalities of gait and mobility: Secondary | ICD-10-CM

## 2016-04-12 NOTE — Progress Notes (Signed)
   Subjective:    Patient ID: [name] Spence, MD, male    DOB: March 03, 1929, 83 y.o.   MRN: [id number]  HPI   Patient comes in today for follow-up on right ankle pain. This is the first time for the patient seeing me but he has seen Dr.Fields and Dr. Paulla Dolly in the past. Patient has a long-standing history of posterior tibialis tendon insufficiency and midfoot pain. His pain historicaly has been along the medial ankle but today he is complaining of pain along the lateral ankle. It is worse with activity such as golf. He has had multiple sets of orthotics but unfortunately he still has pain. He has tried a couple of different compression sleeves without any relief. X-rays done of his right ankle last year showed some mild tibial talar osteoarthritis but significant subluxation of the midfoot was seen with prominence of the talus medially and shifting of the navicular laterally.    Review of Systems As above    Objective:   Physical Exam  Well-developed, well-nourished. No acute distress.  Right foot: Patient has complete collapse of his transverse arch with forefoot abduction. His posterior tibialis tendon is incompetent. No soft tissue swelling. No effusion. No tenderness to palpation. Severe pronation with walking. Walking with a slight limp.      Assessment & Plan:   Chronic right ankle pain secondary to posterior tibialis tendon incompetency in the lateral column overload  Patient's previous orthotics have had quite a bit of adjustments made over the past several months. I would like to start with making him a new custom orthotic with more arch support than the previous orthotics. He will follow-up with me in 2 weeks for reevaluation. I may need to add a scaphoid pad on top of the orthotic if he continues to have pain. If we could provide him with some sort of medial arch support then I think that would alleviate his lateral ankle discomfort. We will also have him try an arch strap.

## 2016-04-19 ENCOUNTER — Encounter: Payer: Self-pay | Admitting: Sports Medicine

## 2016-04-19 ENCOUNTER — Ambulatory Visit (INDEPENDENT_AMBULATORY_CARE_PROVIDER_SITE_OTHER): Payer: PPO | Admitting: Sports Medicine

## 2016-04-19 DIAGNOSIS — M25579 Pain in unspecified ankle and joints of unspecified foot: Secondary | ICD-10-CM

## 2016-04-19 NOTE — Progress Notes (Signed)
  Patient comes in today for follow-up. He decided to add his scaphoid pad to the top of his new orthotics but he has placed it on incorrectly. He has noticed that this has helped his lateral ankle pain. We made the simple correction of placing his scaphoid pad correctly on the top of his right orthotic. He finds this to be fairly comfortable. It is a medium size scaphoid pad so I've given him both a small and a large and he will continue to experiment with all of this in both his tennis shoes and his golf shoes. He will also continue to experiment with the arch strap that we gave him at his last office visit. I would like for him to follow-up with me in 2-3 weeks for feedback.  Total time spent with the patient was 10 minutes with greater than 50% of the time spent in face-to-face consultation discussing orthotic correction.

## 2016-04-26 ENCOUNTER — Ambulatory Visit: Payer: PPO | Admitting: Sports Medicine

## 2016-04-28 DIAGNOSIS — J4530 Mild persistent asthma, uncomplicated: Secondary | ICD-10-CM | POA: Diagnosis not present

## 2016-04-28 DIAGNOSIS — J309 Allergic rhinitis, unspecified: Secondary | ICD-10-CM | POA: Diagnosis not present

## 2016-04-29 ENCOUNTER — Ambulatory Visit: Payer: PPO | Admitting: Sports Medicine

## 2016-04-29 DIAGNOSIS — H02403 Unspecified ptosis of bilateral eyelids: Secondary | ICD-10-CM | POA: Diagnosis not present

## 2016-04-29 DIAGNOSIS — H01001 Unspecified blepharitis right upper eyelid: Secondary | ICD-10-CM | POA: Diagnosis not present

## 2016-04-29 DIAGNOSIS — H52202 Unspecified astigmatism, left eye: Secondary | ICD-10-CM | POA: Diagnosis not present

## 2016-04-29 DIAGNOSIS — Z961 Presence of intraocular lens: Secondary | ICD-10-CM | POA: Diagnosis not present

## 2016-05-03 ENCOUNTER — Ambulatory Visit (INDEPENDENT_AMBULATORY_CARE_PROVIDER_SITE_OTHER): Payer: PPO | Admitting: Sports Medicine

## 2016-05-03 ENCOUNTER — Encounter: Payer: Self-pay | Admitting: Sports Medicine

## 2016-05-03 VITALS — BP 99/52 | Ht 69.5 in | Wt 160.0 lb

## 2016-05-03 DIAGNOSIS — M25571 Pain in right ankle and joints of right foot: Secondary | ICD-10-CM | POA: Diagnosis not present

## 2016-05-03 NOTE — Progress Notes (Signed)
   Subjective:    Patient ID: [name] Spence, MD, male    DOB: March 24, 1929, 83 y.o.   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 In addition to the scaphoid pad on top of his custom orthotic he has added a smaller scaphoid pad to the bottom of the orthotic as well as a modified version of a medial wedge. He is still struggling with lateral ankle pain from his incompetent posterior tibialis tendon. He has found the arch strap to be somewhat helpful.    Review of Systems     Objective:   Physical Exam  Well-developed, well-nourished.  Right ankle: Complete collapse of the longitudinal arch. Completely incompetent posterior tibialis tendon. No ankle effusion. He is tender to palpation slightly along the lateral joint line. Neurovascularly intact distally.      Assessment & Plan:   Chronic right ankle pain secondary to posterior tibialis tendon incompetency and lateral column overload  We removed the modified wedge from the right orthotic and replaced it with a smaller medial heel wedge which we doubled up. We also added a smaller scaphoid pad to the undersurface of his left orthotic. Follow-up with me in 2 weeks for reevaluation. His main complaint is lateral ankle pain and if he continues to struggle despite today's corrections then we may try an intra-articular cortisone injection.

## 2016-05-13 ENCOUNTER — Other Ambulatory Visit: Payer: Self-pay | Admitting: Family Medicine

## 2016-05-13 DIAGNOSIS — J45909 Unspecified asthma, uncomplicated: Secondary | ICD-10-CM

## 2016-05-13 DIAGNOSIS — R351 Nocturia: Secondary | ICD-10-CM

## 2016-05-13 DIAGNOSIS — N401 Enlarged prostate with lower urinary tract symptoms: Secondary | ICD-10-CM

## 2016-06-07 ENCOUNTER — Ambulatory Visit (INDEPENDENT_AMBULATORY_CARE_PROVIDER_SITE_OTHER): Payer: PPO | Admitting: Internal Medicine

## 2016-06-07 ENCOUNTER — Encounter: Payer: Self-pay | Admitting: Internal Medicine

## 2016-06-07 VITALS — BP 130/72 | HR 63 | Ht 69.5 in | Wt 157.6 lb

## 2016-06-07 DIAGNOSIS — J3089 Other allergic rhinitis: Secondary | ICD-10-CM | POA: Diagnosis not present

## 2016-06-07 DIAGNOSIS — J4530 Mild persistent asthma, uncomplicated: Secondary | ICD-10-CM

## 2016-06-07 NOTE — Patient Instructions (Signed)
Mild persistent asthma without complication Well controlled on arnuity CXR 04/02/16 shows improvement of pneumonia infiltrates  Plan Continue arnuity as before; take samples  followup  - flu shot in fall - return in 6 months or sooner if needed  Allergic rhinitis Continue nasal steroids Can try nightly saline nasal spray to bolstser symptom relief

## 2016-06-07 NOTE — Assessment & Plan Note (Signed)
Well controlled on arnuity CXR 04/02/16 shows improvement of pneumonia infiltrates  Plan Continue arnuity as before; take samples  followup  - flu shot in fall - return in 6 months or sooner if needed

## 2016-06-07 NOTE — Assessment & Plan Note (Signed)
Continue nasal steroids Can try nightly saline nasal spray to bolstser symptom relief

## 2016-06-07 NOTE — Progress Notes (Signed)
Subjective:     Patient ID: [name] Spence, MD, male   DOB: [birth year], 83 y.o.   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 Followed in past for asthma w/ bronchitis. LOV around 2008. Medical hx CAD/ MI/CABG, AFib/ warfarin, diastolic dysfunction, GERD He did well for several years with help from Dr Sherren Mocha. Starting about 6 months ago with no obvious trigger, he began having increased wheeze, cough, needing SABA 2-3x/ day and Qvar only intermittently. He has been worse this Spring, blaming pollen. Sputum remains clear. Denies fever, nodes, blood, edema, chest pain. Hx  Intermittent AFib- no pacemaker Had BCG in medical school.         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 Followed in past for asthma w/ bronchitis.  Medical hx CAD/ MI/CABG,P AFib/ warfarin, diastolic dysfunction, GERD. Dr Coralie Keens alerted [place] that pt was having more trouble, so we could work him in today. Follows For: Pt c/o wheezing and SOB, prod cough with thick yellow mucus. States he had asthma flare up last night. Has has had 3 episodes of A-fib in last week, most recent was 06/18/14.  Persistent variable chest rattle has been going on almost since he was last seen here. Using albuterol rescue inhaler once or twice daily. He has caught a cold from his wife and overnight began or persistent wheeze, using rescue inhaler 4 times daily and continuing his Qvar. Transient relief only. Sputum trace yellow. No fever. He has a little left over prednisone but reports that 20 mg of prednisone daily is enough of a stimulant to trigger his atrial fib.  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  Medical hx CAD/ MI/CABG,P AFib/ warfarin, diastolic dysfunction, GERD Follow For: Pt  doing well since last visit. Still post nasal drip. Denies wheezing, SOB, Prod cough.  Has not needed rescue inhaler in months and denies wheeze since a cold in early spring. Using Qvar twice daily and Singulair. Notices some hoarseness, mouth breathing  Told he has some pulmonary hypertension. We discussed possible role of oxygen desaturation. Does not recognize symptoms of sleep apnea. CXR 06/24/14 IMPRESSION: No active cardiopulmonary disease. Electronically Signed  By: Franchot Gallo M.D.  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 Medical history CAD/MI/CABG, P A. fib/warfarin, diastolic dysfunction, GERD FOLLOWS FOR: Pt states he has had very little SOB or wheezing since last OV. Has had trouble with Afib. He has continued Flovent 110 and pro air rescue inhaler twice daily each. Has not recognized any association of atrial fib or palpitations with use of albuterol. [name] last year with no particular benefit recognized over his current regimen. He asks about persistent hoarseness. Overnight Oximetry 09/30/2014-normal, not qualifying for sleep O2   OV 10/23/2015  Chief Complaint  Patient presents with  . Hospitalization Follow-up    Pt recently hospitalized for LLL CAP. Pt c/o prod cough with green, blood streaked mucus.  Pt discharged yesterday.     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 In June 2017 he had worsening asthma symptoms and he saw Dr. Hardie Pulley and allergy. According to his history exhaled nitric oxide at this visit was elevated at 70 ppb. He was then started on inhaled corticosteroid ARNUITY . Marland Kitchen Up until that point he was only taking her steroids as needed. After  that he started taking her steroids regular basis. At follow-up his   He maintain himself on the inhaled steroid. Apparently blood allergy panel was negative. He was then leading his baseline life with control of atrial fibrillation.  However on 10/19/2015 he got hospitalized for left lower lobe pneumonia. Personally visualized the chest x-ray review the records and agree with the findings. It sounds like he had bacterial pneumonia not otherwise specified. He was discharged yesterday. He still on Augmentin. He has postnasal drainage but is not taking any nasal steroids. He has nocturnal acid reflux and is on fish oil. He is feeling back to baseline other than some post pneumonia fatigue. He also feels his atrial fibrillation is somewhat out of control. He has some occasional streaky hemoptysis but this is improving.   OV 12/03/2015  Chief Complaint  Patient presents with  . Follow-up    Pt states his SOB is at baseline - doing well. Pt denies cough, CP/tightness, and f/c/s.     Follow-up left lower lobe pneumonia.  He was hospitalized 10/19/2015 for left lower lobe pneumonia. This is clinical follow-up for the same. He no longer has hemoptysis since his last office visit mid September 2017. He continues to feel well. He is asymptomatic without any shortness of breath wheezing or chest tightness or cough. Only she is was nasal drip that bothers him. He tells me that a year ago he had sinus scan and it showed significant sinus congestion. He is followed up with ENT in the past but is not in a while. Inhaled steroids to the nose helping him but not fully controlled this. He says he will follow-up with ENT for the same. But at this point overall he is feeling fine and playing golf. He is compliant with his inhaled corticosteroid ARNUITY. He will need a chest x-ray today. He is up-to-date with his flu shot.   In interim he did see Dr Aundra Dubin for  Afib and notes reviewed   [place] 06/07/2016  Chief Complaint  Patient presents with  . Follow-up    Pt states his breathing has been doing well. Pt c/o prod cough with clear mucus in morning - baseline for pt. Pt denies CP/tightness and f/c/s.    Follow-up mild persistent asthma on inhaled  corticosteroid ARNUITY: He is currently feeling well. Symptoms are extremely well controlled. He visited his allergist Dr. Orvil Feil in March 2018 and his nitric oxide was 17 according to his history. There no new issues. He wants samples of his inhaled steroid because he is in the donut hole  Follow-up allergic rhinitis: This continues to bother him. It is not any worse. It is stable and mild. He is on inhaled nasal steroid and inhaled antihistamine. Despite nasal drainage and he coughs early in the.  morning . He is willing to add on nasal saline  Follow-up pneumonia: He had this last year: Follow-up chest x-ray February 2018 showed resolution. We recommended a CT scan chest for follow-up but he opted to have a chest x-ray in instead    COMPARISON:  Chest CT 01/05/2016.  Chest radiographs 12/03/2015. IMPRESSION: 1. Previous bilateral bronchopneumonia appears resolved. No acute cardiopulmonary abnormality. 2.  Calcified aortic atherosclerosis.   Electronically Signed   By: Genevie Ann M.D.   On: 04/02/2016 12:01    has a past medical history of Allergic rhinitis; Anxiety; Asthma; Atrial fibrillation ([place]); CAD (coronary artery disease); Depression; Diverticulosis (03/16/1995); DJD (degenerative joint disease); GERD (gastroesophageal reflux disease) (12/30/2000); Heart attack ([place]) (  1984); Hiatal hernia (12/30/2000); History of blood transfusion (1988); History of duodenal ulcer (10/15/1986); Hyperlipidemia; Hypertension; Sinus bradycardia; and Stroke ([place]).   reports that he has never smoked. He has never used smokeless tobacco.  Past Surgical History:  Procedure Laterality Date  . CARDIAC CATHETERIZATION    . CATARACT EXTRACTION W/ INTRAOCULAR LENS  IMPLANT, BILATERAL Bilateral ?2013  . CHOLECYSTECTOMY OPEN  1989  . CORONARY ANGIOPLASTY WITH STENT PLACEMENT  2010  . hospital ccu  1984   heart attack, hypoplastic right coronary  . INGUINAL HERNIA REPAIR Right ~ 1990  . NASAL SEPTUM SURGERY   1972  . TONSILLECTOMY    . TOTAL KNEE ARTHROPLASTY Bilateral 1998-2004   "right-left"  . VAGOTOMY AND PYLOROPLASTY  1988   "bleeding duodenal ulcer"    Allergies  Allergen Reactions  . Advil [Ibuprofen] Nausea Only and Rash  . Asa [Aspirin] Rash  . Tylenol [Acetaminophen] Rash    Immunization History  Administered Date(s) Administered  . Influenza Split 10/23/2012, 10/02/2015  . Influenza Whole 10/27/2007, 11/13/2008, 10/29/2009, 11/09/2010  . Influenza-Unspecified 12/09/2013, 10/28/2014  . Pneumococcal Conjugate-13 09/17/2013  . Pneumococcal Polysaccharide-23 08/30/2000, 11/29/2005  . Td 06/20/2007  . Zoster 06/20/2007    Family History  Problem Relation Age of Onset  . Coronary artery disease Mother     deceased  . Deep vein thrombosis Sister   . Coronary artery disease Brother   . Asthma Brother   . Colon cancer Neg Hx      Current Outpatient Prescriptions:  .  atenolol (TENORMIN) 25 MG tablet, Take 25 mg by mouth 2 (two) times daily., Disp: , Rfl:  .  atorvastatin (LIPITOR) 20 MG tablet, TAKE 1 TABLET(20 MG) BY MOUTH DAILY, Disp: 90 tablet, Rfl: 4 .  azelastine (ASTELIN) 0.1 % nasal spray, Place 2 sprays into both nostrils daily. Use in each nostril as directed, Disp: , Rfl:  .  beclomethasone (BECONASE-AQ) 42 MCG/SPRAY nasal spray, Place 1 spray into both nostrils 2 (two) times daily. Dose is for each nostril., Disp: , Rfl:  .  buPROPion (WELLBUTRIN SR) 150 MG 12 hr tablet, Take 1 tablet (150 mg total) by mouth daily., Disp: 100 tablet, Rfl: 4 .  cetirizine (ZYRTEC) 10 MG tablet, Take 10 mg by mouth daily with supper. , Disp: , Rfl:  .  Cholecalciferol (VITAMIN D) 2000 units tablet, Take 2,000 Units by mouth daily., Disp: , Rfl:  .  dofetilide (TIKOSYN) 250 MCG capsule, Take 1 capsule (250 mcg total) by mouth 2 (two) times daily., Disp: 180 capsule, Rfl: 2 .  ELIQUIS 5 MG TABS tablet, TAKE 1 TABLET(5 MG) BY MOUTH TWICE DAILY, Disp: 60 tablet, Rfl: 6 .  Fluticasone  Furoate (ARNUITY ELLIPTA) 200 MCG/ACT AEPB, Inhale 1 puff into the lungs daily., Disp: 30 each, Rfl: 11 .  furosemide (LASIX) 20 MG tablet, Take 1 tablet (20 mg total) by mouth daily as needed (ankle swelling)., Disp: 100 tablet, Rfl: 3 .  guaiFENesin (MUCINEX) 600 MG 12 hr tablet, Take 1 tablet (600 mg total) by mouth 2 (two) times daily. (Patient taking differently: Take 600 mg by mouth 2 (two) times daily as needed. ), Disp: 30 tablet, Rfl: 0 .  levalbuterol (XOPENEX HFA) 45 MCG/ACT inhaler, Inhale 1 puff into the lungs every 4 (four) hours as needed for wheezing or shortness of breath., Disp: , Rfl:  .  LORazepam (ATIVAN) 1 MG tablet, 1/2-1 tablet daily at bedtime when necessary, Disp: 60 tablet, Rfl: 3 .  nitroGLYCERIN (NITROSTAT) 0.4 MG  SL tablet, Place 1 tablet (0.4 mg total) under the tongue every 5 (five) minutes x 3 doses as needed for chest pain., Disp: 25 tablet, Rfl: 2 .  omeprazole (PRILOSEC OTC) 20 MG tablet, Take 1 tablet (20 mg total) by mouth daily. (Patient taking differently: Take 20 mg by mouth daily with supper. ), Disp: 100 tablet, Rfl: 4 .  PROAIR HFA 108 (90 Base) MCG/ACT inhaler, INHALE 2 PUFFS INTO THE LUNGS EVERY 6 HOURS AS NEEDED FOR WHEEZING, Disp: 8.5 g, Rfl: 5 .  tamsulosin (FLOMAX) 0.4 MG CAPS capsule, TAKE 1 CAPSULE BY MOUTH DAILY, Disp: 90 capsule, Rfl: 1 .  traMADol (ULTRAM) 50 MG tablet, Take 1 tablet (50 mg total) by mouth every 12 (twelve) hours as needed., Disp: 60 tablet, Rfl: 2    Review of Systems     Objective:   Physical Exam  Constitutional: He is oriented to person, place, and time. He appears well-developed and well-nourished. No distress.  HENT:  Head: Normocephalic and atraumatic.  Right Ear: External ear normal.  Left Ear: External ear normal.  Mouth/Throat: Oropharynx is clear and moist. No oropharyngeal exudate.  Post nasall drip +  Eyes: Conjunctivae and EOM are normal. Pupils are equal, round, and reactive to light. Right eye exhibits no  discharge. Left eye exhibits no discharge. No scleral icterus.  Neck: Normal range of motion. Neck supple. No JVD present. No tracheal deviation present. No thyromegaly present.  Cardiovascular: Normal rate, regular rhythm and intact distal pulses.  Exam reveals no gallop and no friction rub.   No murmur heard. Pulmonary/Chest: Effort normal and breath sounds normal. No respiratory distress. He has no wheezes. He has no rales. He exhibits no tenderness.  Abdominal: Soft. Bowel sounds are normal. He exhibits no distension and no mass. There is no tenderness. There is no rebound and no guarding.  Musculoskeletal: Normal range of motion. He exhibits no edema or tenderness.  Lymphadenopathy:    He has no cervical adenopathy.  Neurological: He is alert and oriented to person, place, and time. He has normal reflexes. No cranial nerve deficit. Coordination normal.  Skin: Skin is warm and dry. No rash noted. He is not diaphoretic. No erythema. No pallor.  Psychiatric: He has a normal mood and affect. His behavior is normal. Judgment and thought content normal.  Nursing note and vitals reviewed.  Vitals:   06/07/16 0910  BP: 130/72  Pulse: 63  SpO2: 98%  Weight: 157 lb 9.6 oz (71.5 kg)  Height: 5' 9.5" (1.765 m)    Estimated body mass index is 22.94 kg/m as calculated from the following:   Height as of this encounter: 5' 9.5" (1.765 m).   Weight as of this encounter: 157 lb 9.6 oz (71.5 kg).     Assessment:       ICD-9-CM ICD-10-CM   1. Mild persistent asthma without complication 161.09 U04.54   2. Non-seasonal allergic rhinitis, unspecified trigger 477.8 J30.89        Plan:     Mild persistent asthma without complication Well controlled on arnuity CXR 04/02/16 shows improvement of pneumonia infiltrates  Plan Continue arnuity as before; take samples  followup  - flu shot in fall - return in 6 months or sooner if needed  Allergic rhinitis Continue nasal steroids Can try  nightly saline nasal spray to bolstser symptom relief    Dr. Brand Males, M.D., [facility].C.P Pulmonary and Critical Care Medicine Staff Physician [facility] Pager: [phone]  [phone], If no answer or between  15:00h - 7:00h: call [phone]  06/07/2016 9:34 AM

## 2016-06-18 ENCOUNTER — Other Ambulatory Visit (HOSPITAL_COMMUNITY): Payer: Self-pay | Admitting: Nurse Practitioner

## 2016-06-25 ENCOUNTER — Ambulatory Visit (HOSPITAL_COMMUNITY)
Admission: RE | Admit: 2016-06-25 | Discharge: 2016-06-25 | Disposition: A | Discharge status: Home / Self Care | Payer: PPO | Source: Ambulatory Visit | Attending: Nurse Practitioner | Admitting: Nurse Practitioner

## 2016-06-25 ENCOUNTER — Encounter (HOSPITAL_COMMUNITY): Payer: Self-pay | Admitting: Nurse Practitioner

## 2016-06-25 VITALS — BP 116/64 | HR 63 | Ht 69.5 in | Wt 157.8 lb

## 2016-06-25 DIAGNOSIS — J45909 Unspecified asthma, uncomplicated: Secondary | ICD-10-CM | POA: Insufficient documentation

## 2016-06-25 DIAGNOSIS — I4891 Unspecified atrial fibrillation: Secondary | ICD-10-CM | POA: Diagnosis not present

## 2016-06-25 DIAGNOSIS — F419 Anxiety disorder, unspecified: Secondary | ICD-10-CM | POA: Insufficient documentation

## 2016-06-25 DIAGNOSIS — I2510 Atherosclerotic heart disease of native coronary artery without angina pectoris: Secondary | ICD-10-CM | POA: Insufficient documentation

## 2016-06-25 DIAGNOSIS — R9431 Abnormal electrocardiogram [ECG] [EKG]: Secondary | ICD-10-CM | POA: Insufficient documentation

## 2016-06-25 DIAGNOSIS — Z7901 Long term (current) use of anticoagulants: Secondary | ICD-10-CM | POA: Insufficient documentation

## 2016-06-25 DIAGNOSIS — K219 Gastro-esophageal reflux disease without esophagitis: Secondary | ICD-10-CM | POA: Diagnosis not present

## 2016-06-25 DIAGNOSIS — I252 Old myocardial infarction: Secondary | ICD-10-CM | POA: Diagnosis not present

## 2016-06-25 DIAGNOSIS — Z79899 Other long term (current) drug therapy: Secondary | ICD-10-CM | POA: Insufficient documentation

## 2016-06-25 DIAGNOSIS — F329 Major depressive disorder, single episode, unspecified: Secondary | ICD-10-CM | POA: Diagnosis not present

## 2016-06-25 DIAGNOSIS — Z8673 Personal history of transient ischemic attack (TIA), and cerebral infarction without residual deficits: Secondary | ICD-10-CM | POA: Insufficient documentation

## 2016-06-25 DIAGNOSIS — I10 Essential (primary) hypertension: Secondary | ICD-10-CM | POA: Insufficient documentation

## 2016-06-25 DIAGNOSIS — I480 Paroxysmal atrial fibrillation: Secondary | ICD-10-CM

## 2016-06-25 DIAGNOSIS — E785 Hyperlipidemia, unspecified: Secondary | ICD-10-CM | POA: Diagnosis not present

## 2016-06-25 LAB — BASIC METABOLIC PANEL
Anion gap: 5 (ref 5–15)
BUN: 19 mg/dL (ref 6–20)
CO2: 27 mmol/L (ref 22–32)
CREATININE: 1.25 mg/dL — AB (ref 0.61–1.24)
Calcium: 9.2 mg/dL (ref 8.9–10.3)
Chloride: 106 mmol/L (ref 101–111)
GFR, EST AFRICAN AMERICAN: 59 mL/min — AB (ref >60)
GFR, EST NON AFRICAN AMERICAN: 51 mL/min — AB (ref >60)
Glucose, Bld: 156 mg/dL — ABNORMAL HIGH (ref 65–99)
POTASSIUM: 4.5 mmol/L (ref 3.5–5.1)
SODIUM: 138 mmol/L (ref 135–145)

## 2016-06-25 LAB — MAGNESIUM: MAGNESIUM: 1.9 mg/dL (ref 1.7–2.4)

## 2016-06-25 MED ORDER — DILTIAZEM HCL 30 MG PO TABS: ORAL_TABLET | ORAL | 2 refills | Status: DC

## 2016-06-25 NOTE — Progress Notes (Signed)
Patient ID: [name] Spence, MD, male   DOB: July 23, 1929, 83 y.o.   MRN: [id number]     Primary Care Physician: [name], MD Referring Physician: Dr. Robyn Haber, MD is a 83 y.o. male with a h/o PAF on Tikosyn for f/u in the afib clinic. He has noted afib break through 1x a week usually lasting 4-8 hours. This is better tolerated than in the past. Has been staying active playing golf and has not had any issues with activity bringing on afib, usually comes on in the middle of the night. Denies snoring.Marland Kitchen He has not had any further symptoms of stroke and is doing well on eliquis without bleeding issues. QTc stable today. He can tolerate his current afib burden.  Today, he denies symptoms of palpitations, chest pain, shortness of breath, orthopnea, PND, lower extremity edema, dizziness, presyncope, syncope, or neurologic sequela. The patient is tolerating medications without difficulties and is otherwise without complaint today.   Past Medical History:  Diagnosis Date  . Allergic rhinitis   . Anxiety   . Asthma   . Atrial fibrillation ([place])   . CAD (coronary artery disease)   . Depression   . Diverticulosis 03/16/1995  . DJD (degenerative joint disease)    "most joints" (03/11/2015)  . GERD (gastroesophageal reflux disease) 12/30/2000  . Heart attack ([place]) 1984  . Hiatal hernia 12/30/2000  . History of blood transfusion 1988   "related to GI bleeding OR"  . History of duodenal ulcer 10/15/1986  . Hyperlipidemia   . Hypertension   . Sinus bradycardia   . Stroke [facility])    Past Surgical History:  Procedure Laterality Date  . CARDIAC CATHETERIZATION    . CATARACT EXTRACTION W/ INTRAOCULAR LENS  IMPLANT, BILATERAL Bilateral ?2013  . CHOLECYSTECTOMY OPEN  1989  . CORONARY ANGIOPLASTY WITH STENT PLACEMENT  2010  . hospital ccu  1984   heart attack, hypoplastic right coronary  . INGUINAL HERNIA REPAIR Right ~ 1990  . NASAL SEPTUM SURGERY  1972  . TONSILLECTOMY    . TOTAL KNEE  ARTHROPLASTY Bilateral 1998-2004   "right-left"  . VAGOTOMY AND PYLOROPLASTY  1988   "bleeding duodenal ulcer"    Current Outpatient Prescriptions  Medication Sig Dispense Refill  . atenolol (TENORMIN) 25 MG tablet Take 25 mg by mouth 2 (two) times daily.    Marland Kitchen atorvastatin (LIPITOR) 20 MG tablet TAKE 1 TABLET(20 MG) BY MOUTH DAILY 90 tablet 4  . azelastine (ASTELIN) 0.1 % nasal spray Place 2 sprays into both nostrils daily. Use in each nostril as directed    . beclomethasone (BECONASE-AQ) 42 MCG/SPRAY nasal spray Place 1 spray into both nostrils daily. Dose is for each nostril.     Marland Kitchen buPROPion (WELLBUTRIN SR) 150 MG 12 hr tablet Take 1 tablet (150 mg total) by mouth daily. 100 tablet 4  . cetirizine (ZYRTEC) 10 MG tablet Take 10 mg by mouth daily with supper.     . Cholecalciferol (VITAMIN D) 2000 units tablet Take 2,000 Units by mouth daily.    Marland Kitchen dofetilide (TIKOSYN) 250 MCG capsule Take 1 capsule (250 mcg total) by mouth 2 (two) times daily. 180 capsule 2  . ELIQUIS 5 MG TABS tablet TAKE 1 TABLET(5 MG) BY MOUTH TWICE DAILY 60 tablet 6  . Fluticasone Furoate (ARNUITY ELLIPTA) 200 MCG/ACT AEPB Inhale 1 puff into the lungs daily. 30 each 11  . furosemide (LASIX) 20 MG tablet Take 1 tablet (20 mg total) by mouth daily  as needed (ankle swelling). 100 tablet 3  . guaiFENesin (MUCINEX) 600 MG 12 hr tablet Take 1 tablet (600 mg total) by mouth 2 (two) times daily. (Patient taking differently: Take 600 mg by mouth 2 (two) times daily as needed. ) 30 tablet 0  . levalbuterol (XOPENEX HFA) 45 MCG/ACT inhaler Inhale 1 puff into the lungs every 4 (four) hours as needed for wheezing or shortness of breath.    Marland Kitchen LORazepam (ATIVAN) 1 MG tablet 1/2-1 tablet daily at bedtime when necessary 60 tablet 3  . nitroGLYCERIN (NITROSTAT) 0.4 MG SL tablet Place 1 tablet (0.4 mg total) under the tongue every 5 (five) minutes x 3 doses as needed for chest pain. 25 tablet 2  . omeprazole (PRILOSEC OTC) 20 MG tablet Take  1 tablet (20 mg total) by mouth daily. (Patient taking differently: Take 20 mg by mouth daily with supper. ) 100 tablet 4  . tamsulosin (FLOMAX) 0.4 MG CAPS capsule TAKE 1 CAPSULE BY MOUTH DAILY 90 capsule 1  . traMADol (ULTRAM) 50 MG tablet Take 1 tablet (50 mg total) by mouth every 12 (twelve) hours as needed. (Patient taking differently: Take 50 mg by mouth daily as needed. ) 60 tablet 2  . diltiazem (CARDIZEM) 30 MG tablet Take 1 tablet every 4 hours AS NEEDED for afib heart rate >100 45 tablet 2   No current facility-administered medications for this encounter.     Allergies  Allergen Reactions  . Advil [Ibuprofen] Nausea Only and Rash  . Asa [Aspirin] Rash  . Tylenol [Acetaminophen] Rash    Social History   Social History  . Marital status: Married    Spouse name: N/A  . Number of children: 2  . Years of education: N/A   Occupational History  . DOCTOR Retired   Social History Main Topics  . Smoking status: Never Smoker  . Smokeless tobacco: Never Used  . Alcohol use No  . Drug use: No  . Sexual activity: Not Currently   Other Topics Concern  . Not on file   Social History Narrative   Married. Regular exercise - yes.     Family History  Problem Relation Age of Onset  . Coronary artery disease Mother        deceased  . Deep vein thrombosis Sister   . Coronary artery disease Brother   . Asthma Brother   . Colon cancer Neg Hx     ROS- All systems are reviewed and negative except as per the HPI above  Physical Exam: Vitals:   06/25/16 0925  BP: 116/64  Pulse: 63  Weight: 157 lb 12.8 oz (71.6 kg)  Height: 5' 9.5" (1.765 m)    GEN- The patient is well appearing, alert and oriented x 3 today.   Head- normocephalic, atraumatic Eyes-  Sclera clear, conjunctiva pink Ears- hearing intact Oropharynx- clear Neck- supple, no JVP Lymph- no cervical lymphadenopathy Lungs- Clear to ausculation bilaterally, normal work of breathing Heart- Regular rate and  rhythm, no murmurs, rubs or gallops, PMI not laterally displaced GI- soft, NT, ND, + BS Extremities- no clubbing, cyanosis, or edema MS- no significant deformity or atrophy Skin- no rash or lesion Psych- euthymic mood, full affect Neuro- strength and sensation are intact  EKG-NSR at 71 bpm, pr int 158 ms, qrs int 72 ms, qtc 439 ms(stable) Epic records reviewed  Assessment and Plan: 1. PAF Doing well maintaining SR some increase in  afib burden, but pt states tolerable   Continue atenolol 25  mg bid Will RX cardizem 30 mg prn as needed for break though episodes Bmet/mag today Update echo  2. Previous CVA Doing well, no more neuro symptoms Taking eliquis without fail for chadsvasc score of at least 4  Dr. Aundra Dubin in to see pt as well with the above plan F/u with Dr. Aundra Dubin 6/24  afib clinic as needed  Butch Penny C. Carroll, [facility] [address] [place], [place] [address] [phone]

## 2016-06-25 NOTE — Patient Instructions (Signed)
Your physician has recommended you make the following change in your medication:  1)Cardizem 30mg -- take 1 tablet every 4 hours AS NEEDED for afib heart rate >100 as long as systolic blood pressure >100.     

## 2016-07-06 ENCOUNTER — Ambulatory Visit: Payer: Self-pay

## 2016-07-06 ENCOUNTER — Ambulatory Visit (INDEPENDENT_AMBULATORY_CARE_PROVIDER_SITE_OTHER): Payer: PPO | Admitting: Family Medicine

## 2016-07-06 ENCOUNTER — Encounter: Payer: Self-pay | Admitting: Family Medicine

## 2016-07-06 VITALS — BP 122/72 | HR 62 | Ht 70.0 in | Wt 156.0 lb

## 2016-07-06 DIAGNOSIS — M25531 Pain in right wrist: Secondary | ICD-10-CM

## 2016-07-06 DIAGNOSIS — M1811 Unilateral primary osteoarthritis of first carpometacarpal joint, right hand: Secondary | ICD-10-CM

## 2016-07-06 NOTE — Progress Notes (Signed)
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 Patient describes the pain as a dull, throbbing aching pain. Patient states that it seems to be more localized around the thumb. Patient is an avid golfer and is able to golf but continues to have discomfort. Patient rates the severity pain as 5 out of 10. Not improving. Looking for something to help him have some relief. States that the pain is chronic. Unable to take anti-inflammatories.     Past Medical History:  Diagnosis Date  . Allergic rhinitis   . Anxiety   . Asthma   . Atrial fibrillation ([place])   . CAD (coronary artery disease)   . Depression   . Diverticulosis 03/16/1995  . DJD (degenerative joint disease)    "most joints" (03/11/2015)  . GERD (gastroesophageal reflux disease) 12/30/2000  . Heart attack ([place]) 1984  . Hiatal hernia 12/30/2000  . History of blood transfusion 1988   "related to GI bleeding OR"  . History of duodenal ulcer 10/15/1986  . Hyperlipidemia   . Hypertension   . Sinus bradycardia   . Stroke [facility])    Past Surgical History:  Procedure Laterality Date  . CARDIAC CATHETERIZATION    . CATARACT EXTRACTION W/ INTRAOCULAR LENS  IMPLANT, BILATERAL Bilateral ?2013  . CHOLECYSTECTOMY OPEN  1989  . CORONARY ANGIOPLASTY WITH STENT PLACEMENT  2010  . hospital [facility]  1984   heart attack, hypoplastic right coronary  . INGUINAL HERNIA REPAIR Right ~ 1990  . NASAL SEPTUM SURGERY  1972  . TONSILLECTOMY    . TOTAL KNEE ARTHROPLASTY Bilateral 1998-2004   "right-left"  . VAGOTOMY AND PYLOROPLASTY  1988   "bleeding duodenal ulcer"   Social History   Social History  . Marital status: Married    Spouse name: N/A  . Number of children: 2  . Years of  education: N/A   Occupational History  . DOCTOR Retired   Social History Main Topics  . Smoking status: Never Smoker  . Smokeless tobacco: Never Used  . Alcohol use No  . Drug use: No  . Sexual activity: Not Currently   Other Topics Concern  . None   Social History Narrative   Married. Regular exercise - yes.    Allergies  Allergen Reactions  . Advil [Ibuprofen] Nausea Only and Rash  . Asa [Aspirin] Rash  . Tylenol [Acetaminophen] Rash   Family History  Problem Relation Age of Onset  . Coronary artery disease Mother        deceased  . Deep vein thrombosis Sister   . Coronary artery disease Brother   . Asthma Brother   . Colon cancer Neg Hx     Past medical history, social, surgical and family history all reviewed in electronic medical record.  No pertanent information unless stated regarding to the chief complaint.   Review of Systems:Review of systems updated and as accurate as of 07/06/16  No headache, visual changes, nausea, vomiting, diarrhea, constipation, dizziness, abdominal pain, skin rash, fevers, chills, night sweats, weight loss, swollen lymph nodes, body aches, , chest pain, shortness of breath, mood changes. Positive muscle aches and joint aches  Objective  Blood pressure 122/72, pulse 62, height 5\' 10"  (  1.778 m), weight 156 lb (70.8 kg). Systems examined below as of 07/06/16   General: No apparent distress alert and oriented x3 mood and affect normal, dressed appropriately.  HEENT: Pupils equal, extraocular movements intact  Respiratory: Patient's speak in full sentences and does not appear short of breath  Cardiovascular: No lower extremity edema, non tender, no erythema  Skin: Warm dry intact with no signs of infection or rash on extremities or on axial skeleton.  Abdomen: Soft nontender  Neuro: Cranial nerves II through XII are intact, neurovascularly intact in all extremities with 2+ DTRs and 2+ pulses.  Lymph: No lymphadenopathy of posterior or  anterior cervical chain or axillae bilaterally.  Gait normal with good balance and coordination.  MSK:  Non tender with full range of motion and good stability and symmetric strength and tone of shoulders, elbows,  hip, knee and ankles bilaterally. Mild arthritic changes of multiple joints   Wrist: Right Inspection reveals a patient does have arthritic changes of multiple joints ROM smooth and normal with good flexion and extension and ulnar/radial deviation that is symmetrical with opposite wrist. Palpation is normal over metacarpals, navicular, lunate, and TFCC; tendons without tenderness/ swelling patient is tender over the [facility] joint No snuffbox tenderness. No tenderness over [place]. Strength 5/5 in all directions without pain. Negative Finkelstein, tinel's and phalens. Positive grind test of the [facility] joint Negative Watson's test. Contralateral wrist mild arthritic changes but nontender  Procedure: Real-time Ultrasound Guided Injection of right CMC Device: GE Logiq Q7 Ultrasound guided injection is preferred based studies that show increased duration, increased effect, greater accuracy, decreased procedural pain, increased response rate, and decreased cost with ultrasound guided versus blind injection.  Verbal informed consent obtained.  Time-out conducted.  Noted no overlying erythema, induration, or other signs of local infection.  Skin prepped in a sterile fashion.  Local anesthesia: Topical Ethyl chloride.  With sterile technique and under real time ultrasound guidance:  With a 25-gauge half-inch needle patient was injected with a total of 0.5 mL of 0.5% Marcaine and 0.5 mL of Kenalog 40 mg/dL. Completed without difficulty  Pain immediately resolved suggesting accurate placement of the medication.  Advised to call if fevers/chills, erythema, induration, drainage, or persistent bleeding.  Images permanently stored and available for review in the ultrasound unit.  Impression:  Technically successful ultrasound guided injection.  Impression and Recommendations:     This case required medical decision making of moderate complexity.      Note: This dictation was prepared with Dragon dictation along with smaller phrase technology. Any transcriptional errors that result from this process are unintentional.

## 2016-07-06 NOTE — Patient Instructions (Signed)
Very nice to meet you  If you shoot a 69 I am taking credit Ice 20 minutes 2 times daily. Usually after activity and before bed. pennsaid pinkie amount topically 2 times daily as needed.   Over the counter  Tart cherry extract any dose at night Vitamin D 2000 IU daily  Vitamin C 500mg  with meals to help iron absorption  See me again in 4 weeks or I can repeat injections every 10 weeks if needed or can give you a brace.

## 2016-07-06 NOTE — Assessment & Plan Note (Signed)
Patient was given an injection today. Tolerated procedure well. We discussed icing regimen and home exercises. We discussed which activities to do in which ones to avoid. Patient will increase activity as tolerated. Patient will follow-up with me again in 4-6 weeks we'll consider bracing if needed

## 2016-07-07 ENCOUNTER — Ambulatory Visit (HOSPITAL_COMMUNITY)
Admission: RE | Admit: 2016-07-07 | Discharge: 2016-07-07 | Disposition: A | Discharge status: Home / Self Care | Payer: PPO | Source: Ambulatory Visit | Attending: Nurse Practitioner | Admitting: Nurse Practitioner

## 2016-07-07 DIAGNOSIS — I083 Combined rheumatic disorders of mitral, aortic and tricuspid valves: Secondary | ICD-10-CM | POA: Insufficient documentation

## 2016-07-07 DIAGNOSIS — I480 Paroxysmal atrial fibrillation: Secondary | ICD-10-CM | POA: Diagnosis not present

## 2016-07-07 DIAGNOSIS — I420 Dilated cardiomyopathy: Secondary | ICD-10-CM | POA: Diagnosis not present

## 2016-07-07 NOTE — Progress Notes (Signed)
  Echocardiogram 2D Echocardiogram has been performed.  [name] [name] 07/07/2016, 9:50 AM

## 2016-07-18 ENCOUNTER — Other Ambulatory Visit: Payer: Self-pay | Admitting: Family Medicine

## 2016-07-19 NOTE — Telephone Encounter (Signed)
Denied.  Filled for 1 year on 01/06/17.  Message sent to the pharmacy to check file.

## 2016-07-23 ENCOUNTER — Other Ambulatory Visit (HOSPITAL_COMMUNITY): Payer: Self-pay | Admitting: Nurse Practitioner

## 2016-07-28 ENCOUNTER — Encounter (HOSPITAL_COMMUNITY): Payer: PPO

## 2016-08-16 ENCOUNTER — Other Ambulatory Visit: Payer: Self-pay | Admitting: Sports Medicine

## 2016-09-27 ENCOUNTER — Ambulatory Visit (INDEPENDENT_AMBULATORY_CARE_PROVIDER_SITE_OTHER): Payer: PPO | Admitting: Sports Medicine

## 2016-09-27 VITALS — BP 120/70 | Ht 70.0 in | Wt 160.0 lb

## 2016-09-27 DIAGNOSIS — M25571 Pain in right ankle and joints of right foot: Secondary | ICD-10-CM

## 2016-09-27 MED ORDER — METHYLPREDNISOLONE ACETATE 40 MG/ML IJ SUSP
40.0000 mg | Freq: Once | INTRAMUSCULAR | Status: AC
  Administered 2016-09-27: 40 mg via INTRA_ARTICULAR

## 2016-09-27 NOTE — Progress Notes (Signed)
   Subjective:    Patient ID: [name] Spence, MD, male    DOB: 14-Jul-1929, 83 y.o.   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  He has some ongoing R ankle pain that has been present for about a year.  Pain initially was medial and now is more along his lateral malleolus.  He is significantly flat footed.  Had posterior tibial tendonitis and it was injected last January which provided some relief.  He has been using his orthotics daily and has followed with clinic with small adjustments made each time to build up his arch.  Today he reports although the medial aspect of his ankle feels much better, he is having pain along the lateral aspect and would like to know if a corticosteroid injection would be of benefit.  His ankle pain is most bothersome when he is playing golf.    Review of Systems  Musculoskeletal: Negative for joint swelling.  Neurological: Negative for weakness and numbness.      Objective:   Physical Exam  83 yo male, NAD  BP 120/70   Ht 5\' 10"  (1.778 m)   Wt 160 lb (72.6 kg)   BMI 22.96 kg/m   Right Ankle: No visible erythema or swelling present.  Complete collapse of longitudinal arch.  Inversion is somewhat limited but otherwise normal ROM.  Strength is 5/5 in all directions.   Stable lateral and medial ligaments.  No pain at base of 5th MT; No tenderness over cuboid; No tenderness over N spot or navicular prominence No tenderness on posterior aspects of  medial malleolus.  Mild tenderness to palpation along lateral joint line.   Neurovascularly intact with normal sensation.     Assessment & Plan:   R foot pain Chronic right ankle pain secondary to posterior tibialis tendon incompetency.  His pain is predominantly lateral aspect of ankle.  -Intraarticular joint cortisone injection given today under [place] in office and have made the following adjustments to his right foot orthotic:  -replaced scaphoid pad with  a new one  -two new heel wedges were added  -patient will follow up prn  Consent obtained and verified. Time-out conducted. Noted no overlying erythema, induration, or other signs of local infection. Skin prepped in a sterile fashion. Topical analgesic spray: Ethyl chloride. Joint: right ankle, lateral approach Needle: 25g 1.5 inch Completed without difficulty. Meds: 1cc 1% xylocaine, 1cc (40mg ) depomedrol  Advised to call if fevers/chills, erythema, induration, drainage, or persistent bleeding.  Patient seen and evaluated with the resident. I agree with the above plan of care. Patient's right ankle was injected today under ultrasound using a lateral approach. He tolerated this without difficulty. We've replaced his scaphoid pad and his heel wedges and he will continue with activity as tolerated. He understands that this is a chronic issue that will not go away. Follow-up for ongoing or recalcitrant issues.

## 2016-09-27 NOTE — Assessment & Plan Note (Signed)
Chronic right ankle pain secondary to posterior tibialis tendon incompetency.  His pain is predominantly lateral aspect of ankle.  -Intraarticular joint cortisone injection given today in office and have made the following adjustments to his right foot orthotic:  -replaced scaphoid pad with a new one  -two medial heel wedges were added  -patient will follow up to reevaluate

## 2016-09-29 ENCOUNTER — Ambulatory Visit (HOSPITAL_COMMUNITY)
Admission: RE | Admit: 2016-09-29 | Discharge: 2016-09-29 | Disposition: A | Discharge status: Home / Self Care | Payer: PPO | Source: Ambulatory Visit | Attending: Cardiology | Admitting: Cardiology

## 2016-09-29 ENCOUNTER — Encounter (HOSPITAL_COMMUNITY): Payer: Self-pay | Admitting: Cardiology

## 2016-09-29 VITALS — BP 113/70 | HR 57 | Wt 157.5 lb

## 2016-09-29 DIAGNOSIS — Z7902 Long term (current) use of antithrombotics/antiplatelets: Secondary | ICD-10-CM | POA: Diagnosis not present

## 2016-09-29 DIAGNOSIS — E785 Hyperlipidemia, unspecified: Secondary | ICD-10-CM | POA: Insufficient documentation

## 2016-09-29 DIAGNOSIS — N189 Chronic kidney disease, unspecified: Secondary | ICD-10-CM | POA: Insufficient documentation

## 2016-09-29 DIAGNOSIS — I5032 Chronic diastolic (congestive) heart failure: Secondary | ICD-10-CM | POA: Diagnosis not present

## 2016-09-29 DIAGNOSIS — I2510 Atherosclerotic heart disease of native coronary artery without angina pectoris: Secondary | ICD-10-CM | POA: Insufficient documentation

## 2016-09-29 DIAGNOSIS — I480 Paroxysmal atrial fibrillation: Secondary | ICD-10-CM | POA: Diagnosis not present

## 2016-09-29 DIAGNOSIS — K219 Gastro-esophageal reflux disease without esophagitis: Secondary | ICD-10-CM | POA: Insufficient documentation

## 2016-09-29 DIAGNOSIS — J45909 Unspecified asthma, uncomplicated: Secondary | ICD-10-CM | POA: Diagnosis not present

## 2016-09-29 DIAGNOSIS — Z9861 Coronary angioplasty status: Secondary | ICD-10-CM | POA: Diagnosis not present

## 2016-09-29 DIAGNOSIS — I519 Heart disease, unspecified: Secondary | ICD-10-CM

## 2016-09-29 DIAGNOSIS — Z8673 Personal history of transient ischemic attack (TIA), and cerebral infarction without residual deficits: Secondary | ICD-10-CM | POA: Insufficient documentation

## 2016-09-29 DIAGNOSIS — I5189 Other ill-defined heart diseases: Secondary | ICD-10-CM

## 2016-09-29 DIAGNOSIS — I130 Hypertensive heart and chronic kidney disease with heart failure and stage 1 through stage 4 chronic kidney disease, or unspecified chronic kidney disease: Secondary | ICD-10-CM | POA: Diagnosis not present

## 2016-09-29 DIAGNOSIS — Z8249 Family history of ischemic heart disease and other diseases of the circulatory system: Secondary | ICD-10-CM | POA: Insufficient documentation

## 2016-09-29 LAB — BASIC METABOLIC PANEL
ANION GAP: 7 (ref 5–15)
BUN: 25 mg/dL — ABNORMAL HIGH (ref 6–20)
CALCIUM: 8.9 mg/dL (ref 8.9–10.3)
CO2: 26 mmol/L (ref 22–32)
Chloride: 104 mmol/L (ref 101–111)
Creatinine, Ser: 1.26 mg/dL — ABNORMAL HIGH (ref 0.61–1.24)
GFR, EST AFRICAN AMERICAN: 58 mL/min — AB (ref >60)
GFR, EST NON AFRICAN AMERICAN: 50 mL/min — AB (ref >60)
Glucose, Bld: 160 mg/dL — ABNORMAL HIGH (ref 65–99)
Potassium: 4.1 mmol/L (ref 3.5–5.1)
Sodium: 137 mmol/L (ref 135–145)

## 2016-09-29 LAB — MAGNESIUM: Magnesium: 2 mg/dL (ref 1.7–2.4)

## 2016-09-29 LAB — CBC
HCT: 40.5 % (ref 39.0–52.0)
Hemoglobin: 12.9 g/dL — ABNORMAL LOW (ref 13.0–17.0)
MCH: 29.1 pg (ref 26.0–34.0)
MCHC: 31.9 g/dL (ref 30.0–36.0)
MCV: 91.2 fL (ref 78.0–100.0)
PLATELETS: 281 10*3/uL (ref 150–400)
RBC: 4.44 MIL/uL (ref 4.22–5.81)
RDW: 14.3 % (ref 11.5–15.5)
WBC: 11.9 10*3/uL — AB (ref 4.0–10.5)

## 2016-09-29 NOTE — Progress Notes (Signed)
Medication Samples have been provided to the patient.  Drug name: Eliquis       Strength: 5mg     Qty: 2  LOT: [id number]  Exp.Date: 10/20  Dosing instructions: Take 1 Tablet Twice Day  The patient has been instructed regarding the correct time, dose, and frequency of taking this medication, including desired effects and most common side effects.   [name] [name] 9:40 AM 09/29/2016

## 2016-09-29 NOTE — Patient Instructions (Signed)
Labs today (will call for abnormal results, otherwise no news is good news)  Follow up in 6 months, please call us to schedule your appointment.

## 2016-09-30 NOTE — Progress Notes (Signed)
Patient ID: [name] Spence, MD, male   DOB: 06/15/1929, 83 y.o.   MRN: [id number] PCP: Dr. Sherren Mocha Cardiology: Dr. 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  He had an RV infarct with chronic total occlusion of a small nondominant RCA in the 1980s.  He had a drug-eluting stent to the CFX in 11/10.    He is very symptomatic with atrial fibrillation episodes. He has had a chronic pattern of mild shortness of breath and mild left shoulder pain/chest aching when walking up a hill (x 20 years).   He can walk 3 miles on flat ground without problems.  He golfs 3-4 times a week.  He walks for 20-30 minutes on a treadmill several times a week.   Echo in 7/16 showed normal LV systolic function, EF 47-65% with moderate diastolic dysfunction.  Moderate TR with PA systolic pressure 65 mmHg.  Normal RV size and systolic function.  Given volume overload on exam and abnormal echo, he was started on low dose Lasix in the past.    He was started on Multaq but continued to have episodes of atrial fibrillation.  This was stopped, and he was admitted for Tikosyn loading.  After he went home, he developed left facial droop and left arm weakness.  He was found to have a small right parietal cortical infarct with hemorrhagic conversion.  His symptoms resolved quickly. INR was subtherapeutic at admission.  Warfarin was stopped and he was put on apixaban.   In 9/17, he was admitted with LLL PNA.  During the admission, he had a couple of relatively short runs of atrial fibrillation.  He developed more frequent episodes of atrial fibrillation at home and was started on Tikosyn.   Currently, he feels atrial fibrillation about once a month, and it lasts for a few hours typically.  Symptoms of palpitations generally resolve if he takes a prn dose of diltiazem.  He has not used any Lasix recently.  He has stable exertional dyspnea walking up hills or  up 2 flights of steps.  He does fine walking on flat ground.  He golfs several times a week.  He is on Eliquis, no BRBPR or melena.   ECG (personally reviewed): NSR, nonspecific T wave flattening, QTc 426 msec   Labs (6/11): LDL 63, HDL 35, K 5.5, creatinine 1.1 Labs (6/12): LDL 58, HDL 53, TSH normal, K 4.7, creatinine 1 Labs (12/12): BNP 79 Labs (7/14): K 5, creatinine 1.1, LDL 48, HDL 34 Labs (8/15): LDL 48, HDL 33, TSH normal, K 5.4, creatinine 1.2 Labs (6/16): K 5.1, creatinine 1.27, HCT 42.5 Labs (10/16): LDL 37, HDL 32 Labs (11/16): K 4.4, creatinine 1.36, BNP 103 Labs  (2/17): K 4.5, creatinine 1.38, Mg 2.0, LDL 32 Labs (3/17): HCT 41.7 Labs (5/17): K 4.2, creatinine 1.17, Mg 2.1 Labs (9/17): K 4.2, creatinine 1.16, HCT 37.3 Labs (11/17): hgb 13.3, LDL 40 Labs (5/18): K 4.5, creatinine 1.25  PMH: 1. CAD: RV infarct in the 1980s.  Chronic total occlusion of a small nondominant RCA.  11/10 PCI to CFX with Xience DES.  EF 60% on LV-gram at that time. ETT (11/14) with 9'07" exercise, no significant ST depression.  2.  Paroxysmal atrial fibrillation: Multaq was ineffective. Now on Tikosyn.  3.  Hyperlipidemia 4. GERD 5. HTN 6. Asthma 7. Chronic diastolic CHF: Echo (4/65): EF 55-60%, mild LV hypertrophy, no regional WMAs, mild AI, mild MR,  mild-moderate biatrial enlargement, PA systolic pressure 51 mmHg. Echo (7/16) with EF 60-65%, moderate diastolic dysfunction, normal RV size and systolic function, mild-moderate MR, moderate TR, moderate biatrial enlargement, PA systolic pressure 65 mmHg.  - Echo (5/18): EF 55-60%, mild AI, mild MR, PASP 60 mmHg, normal RV size and systolic function.  8. CKD 9. CVA: Right parietal cortical CVA with peri-infarct hemorrhagic conversion.  CTA showed right M2 branch flow gap.    SH: Nonsmoker.  Retired Stage manager, lives in [place].  2 daughters.  Originally from [place].   FH: Brother with CABG.   ROS: All systems reviewed and negative  except as per HPI.    Current Outpatient Prescriptions  Medication Sig Dispense Refill  . atenolol (TENORMIN) 25 MG tablet Take 25 mg by mouth 2 (two) times daily.    Marland Kitchen atorvastatin (LIPITOR) 20 MG tablet TAKE 1 TABLET(20 MG) BY MOUTH DAILY 90 tablet 4  . azelastine (ASTELIN) 0.1 % nasal spray Place 2 sprays into both nostrils daily. Use in each nostril as directed    . beclomethasone (BECONASE-AQ) 42 MCG/SPRAY nasal spray Place 1 spray into both nostrils daily. Dose is for each nostril.     Marland Kitchen buPROPion (WELLBUTRIN SR) 150 MG 12 hr tablet Take 1 tablet (150 mg total) by mouth daily. 100 tablet 4  . cetirizine (ZYRTEC) 10 MG tablet Take 10 mg by mouth daily with supper.     . Cholecalciferol (VITAMIN D) 2000 units tablet Take 2,000 Units by mouth daily.    Marland Kitchen diltiazem (CARDIZEM) 30 MG tablet Take 1 tablet every 4 hours AS NEEDED for afib heart rate >100 45 tablet 2  . dofetilide (TIKOSYN) 250 MCG capsule TAKE 1 CAPSULE(250 MCG) BY MOUTH TWICE DAILY 180 capsule 1  . ELIQUIS 5 MG TABS tablet TAKE 1 TABLET(5 MG) BY MOUTH TWICE DAILY 60 tablet 6  . Fluticasone Furoate (ARNUITY ELLIPTA) 200 MCG/ACT AEPB Inhale 1 puff into the lungs daily. 30 each 11  . furosemide (LASIX) 20 MG tablet Take 1 tablet (20 mg total) by mouth daily as needed (ankle swelling). 100 tablet 3  . guaiFENesin (MUCINEX) 600 MG 12 hr tablet Take 1 tablet (600 mg total) by mouth 2 (two) times daily. 30 tablet 0  . levalbuterol (XOPENEX HFA) 45 MCG/ACT inhaler Inhale 1 puff into the lungs every 4 (four) hours as needed for wheezing or shortness of breath.    Marland Kitchen LORazepam (ATIVAN) 1 MG tablet 1/2-1 tablet daily at bedtime when necessary 60 tablet 3  . nitroGLYCERIN (NITROSTAT) 0.4 MG SL tablet Place 1 tablet (0.4 mg total) under the tongue every 5 (five) minutes x 3 doses as needed for chest pain. 25 tablet 2  . omeprazole (PRILOSEC OTC) 20 MG tablet Take 1 tablet (20 mg total) by mouth daily. 100 tablet 4  . tamsulosin (FLOMAX) 0.4  MG CAPS capsule TAKE 1 CAPSULE BY MOUTH DAILY 90 capsule 1  . traMADol (ULTRAM) 50 MG tablet TAKE 1 TABLET BY MOUTH EVERY 12 HOURS AS NEEDED. 60 tablet 5   No current facility-administered medications for this encounter.     BP 113/70   Pulse (!) 57   Wt 157 lb 8 oz (71.4 kg)   SpO2 100%   BMI 22.60 kg/m  General: NAD Neck: No JVD, no thyromegaly or thyroid nodule.  Lungs: Clear to auscultation bilaterally with normal respiratory effort. CV: Nondisplaced PMI.  Heart regular S1/S2, no S3/S4, no murmur.  No peripheral edema.  No carotid bruit.  Normal pedal  pulses.  Abdomen: Soft, nontender, no hepatosplenomegaly, no distention.  Skin: Intact without lesions or rashes.  Neurologic: Alert and oriented x 3.  Psych: Normal affect. Extremities: No clubbing or cyanosis.  HEENT: Normal.   Assessment/Plan: 1. 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  This has really not changed.  He is still able to golf, walk on a treadmill, etc with no problem.  ETT in 11/14 was a normal study.  - If he has any change in his exertional symptoms, he will let me know and I will arrange for ETT-Cardiolite.   - He is on apixaban without ASA given stable CAD.   - Continue statin, beta blocker.  2. Hyperlipidemia: Good lipids in 11/17.    3. Chronic diastolic CHF: Echo in 3/55 was stable compared to prior studies, suggesting diastolic dysfunction.  EF 60-65% with grade II diastolic dysfunction and PA systolic pressure 60 mmHg.  RV appeared normal. He looks euvolemic today and has not been taking Lasix.  He will weigh daily and let me know if weight starts to rise or if he starts to develop worsening exertional dyspnea. NYHA class II symptoms currenlty.  4. Atrial fibrillation: Paroxysmal.  Episodes are quite symptomatic, he feels "bad" with atrial fibrillation even when he's rate-controlled. He had frequent breakthrough on Multaq but seems to be doing much better on Tikosyn, has  a short episode about once a month. NSR today.  - Continue Tikosyn.  BMET/Mg today.  QTc ok on today's ECG.  - If has breakthrough afib on Tikosyn, amiodarone would be an option.  - Now on apixaban with CVA on warfarin (was subtherapeutic).  CBC today.  5. HTN: BP controlled.   Followup in 6 months.     Loralie Champagne 09/30/2016

## 2016-10-17 ENCOUNTER — Other Ambulatory Visit: Payer: Self-pay | Admitting: Family Medicine

## 2016-10-18 ENCOUNTER — Telehealth: Payer: Self-pay

## 2016-10-18 NOTE — Telephone Encounter (Signed)
Rx has been signed and was phoned into pt pharmacy.#[id number] with no refills

## 2016-10-18 NOTE — Telephone Encounter (Signed)
Rx for Lorazepam 1 mg has been printed waiting for a signature from dr Sherren Mocha.

## 2016-10-21 ENCOUNTER — Telehealth: Payer: Self-pay

## 2016-10-21 NOTE — Telephone Encounter (Signed)
Pt scheduled to see Dr. Henrene Pastor 11/01/16@11 :15AM. Pt aware of appt. Appt for 10/22/16 with Alonza Bogus PA cancelled.

## 2016-10-22 ENCOUNTER — Ambulatory Visit: Payer: PPO | Admitting: Gastroenterology

## 2016-10-29 ENCOUNTER — Encounter: Payer: Self-pay | Admitting: Family Medicine

## 2016-10-30 ENCOUNTER — Other Ambulatory Visit: Payer: Self-pay | Admitting: Family Medicine

## 2016-10-30 DIAGNOSIS — J45909 Unspecified asthma, uncomplicated: Secondary | ICD-10-CM

## 2016-10-30 DIAGNOSIS — R351 Nocturia: Secondary | ICD-10-CM

## 2016-10-30 DIAGNOSIS — N401 Enlarged prostate with lower urinary tract symptoms: Secondary | ICD-10-CM

## 2016-11-01 ENCOUNTER — Ambulatory Visit (INDEPENDENT_AMBULATORY_CARE_PROVIDER_SITE_OTHER): Payer: PPO | Admitting: Internal Medicine

## 2016-11-01 ENCOUNTER — Encounter: Payer: Self-pay | Admitting: Internal Medicine

## 2016-11-01 VITALS — BP 150/70 | HR 60 | Ht 69.0 in | Wt 155.2 lb

## 2016-11-01 DIAGNOSIS — K6389 Other specified diseases of intestine: Secondary | ICD-10-CM

## 2016-11-01 DIAGNOSIS — E739 Lactose intolerance, unspecified: Secondary | ICD-10-CM

## 2016-11-01 DIAGNOSIS — R197 Diarrhea, unspecified: Secondary | ICD-10-CM

## 2016-11-01 DIAGNOSIS — R140 Abdominal distension (gaseous): Secondary | ICD-10-CM

## 2016-11-01 MED ORDER — RIFAXIMIN 550 MG PO TABS: 550.0000 mg | ORAL_TABLET | Freq: Two times a day (BID) | ORAL | 0 refills | Status: DC

## 2016-11-01 NOTE — Progress Notes (Signed)
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 Previous patient of Dr. Verl Blalock. He has not been seen in this office since 2013. He has a history of lactose intolerance as well as small bowel bacterial overgrowth syndrome. Last colonoscopy 2002 revealing pandiverticulosis. He is also status post vagotomy and pyloroplasty for duodenal ulcer disease in the 80s and is status post cholecystectomy. He reports that he was in his usual state of health until about 2 weeks ago when he developed bloating with diarrhea. This after consuming whipped cream on peaches. He generally watches his lactose intake and will use Lactaid. He did take Lomotil which resolved his diarrhea. No problems with his bowels were bloating over the past week. Typically has formed bowel movements daily. He does inquire about possible recurrent bacterial overgrowth. Previously treated with Xifaxan which worked nicely. GI review of systems is otherwise negative. His weight is stable. No bleeding. He continues to be active with golf  REVIEW OF SYSTEMS:  All non-GI ROS negative except for arthritis, irregular heart beat  Past Medical History:  Diagnosis Date  . Allergic rhinitis   . Anxiety   . Asthma   . Atrial fibrillation ([place])   . CAD (coronary artery disease)   . Depression   . Diverticulosis 03/16/1995  . DJD (degenerative joint disease)    "most joints" (03/11/2015)  . GERD (gastroesophageal reflux disease) 12/30/2000  . Heart attack ([place]) 1984  . Hiatal hernia 12/30/2000  . History of blood transfusion 1988   "related to GI bleeding OR"  . History of duodenal ulcer 10/15/1986  . Hyperlipidemia   . Hypertension   . Sinus bradycardia   . Stroke [facility])     Past Surgical History:  Procedure Laterality Date  . CARDIAC CATHETERIZATION    . CATARACT EXTRACTION W/  INTRAOCULAR LENS  IMPLANT, BILATERAL Bilateral ?2013  . CHOLECYSTECTOMY OPEN  1989  . CORONARY ANGIOPLASTY WITH STENT PLACEMENT  2010  . hospital [facility]  1984   heart attack, hypoplastic right coronary  . INGUINAL HERNIA REPAIR Right ~ 1990  . NASAL SEPTUM SURGERY  1972  . TONSILLECTOMY    . TOTAL KNEE ARTHROPLASTY Bilateral 1998-2004   "right-left"  . VAGOTOMY AND PYLOROPLASTY  1988   "bleeding duodenal ulcer"    Social History [name] Spence, MD  reports that he has never smoked. He has never used smokeless tobacco. He reports that he does not drink alcohol or use drugs.  family history includes Asthma in his brother; Coronary artery disease in his brother and mother; Deep vein thrombosis in his sister.  Allergies  Allergen Reactions  . Advil [Ibuprofen] Nausea Only and Rash  . Asa [Aspirin] Rash  . Tylenol [Acetaminophen] Rash       PHYSICAL EXAMINATION: Vital signs: BP (!) 150/70 (BP Location: Left Arm, Patient Position: Sitting, Cuff Size: Normal)   Pulse 60   Ht 5\' 9"  (1.753 m) Comment: height measured without shoes  Wt 155 lb 4 oz (70.4 kg)   BMI 22.93 kg/m   Constitutional: generally well-appearing, no acute distress Psychiatric: alert and oriented x3, cooperative Eyes: extraocular movements intact, anicteric, conjunctiva pink Mouth: oral pharynx moist, no lesions Neck: supple no lymphadenopathy Cardiovascular: heart irregular beats, no murmur Lungs: clear to auscultation bilaterally Abdomen: soft, nontender, nondistended, no obvious ascites, no peritoneal signs, normal bowel sounds, no organomegaly. Prior  surgical incisions well-healed. No hernia Rectal: Omitted Extremities: no clubbing cyanosis or lower extremity edema bilaterally Skin: no lesions on visible extremities Neuro: No focal deficits. Cranial nerves intact  ASSESSMENT:  #1. Recent problems with bloating and diarrhea. Lactose related by history #2. History of small bowel bacterial overgrowth #3.  History of peptic ulcer disease. On PPI #4. Multiple medical problems   PLAN:  #1. Minimize unnecessary lactose-containing items #2. Lactaid as needed #3. Samples of Xifaxan 550 mg twice daily for 2 weeks provided should he have significant recurrent problems. This would address possible recurrent bacterial overgrowth. Discussed #4. Continue daily PPI given history of ulcer disease and chronic anticoagulation. Discussed #5. GI follow-up as needed

## 2016-11-01 NOTE — Patient Instructions (Signed)
You have been given samples of Xifaxan to take as needed

## 2016-11-15 DIAGNOSIS — L570 Actinic keratosis: Secondary | ICD-10-CM | POA: Diagnosis not present

## 2016-11-15 DIAGNOSIS — D1801 Hemangioma of skin and subcutaneous tissue: Secondary | ICD-10-CM | POA: Diagnosis not present

## 2016-11-15 DIAGNOSIS — I788 Other diseases of capillaries: Secondary | ICD-10-CM | POA: Diagnosis not present

## 2016-11-17 ENCOUNTER — Other Ambulatory Visit: Payer: Self-pay | Admitting: Cardiology

## 2016-11-17 ENCOUNTER — Encounter: Payer: Self-pay | Admitting: Neurology

## 2016-11-17 ENCOUNTER — Ambulatory Visit (INDEPENDENT_AMBULATORY_CARE_PROVIDER_SITE_OTHER): Payer: PPO | Admitting: Neurology

## 2016-11-17 VITALS — BP 141/74 | HR 76 | Ht 69.0 in | Wt 157.6 lb

## 2016-11-17 DIAGNOSIS — I6990 Unspecified sequelae of unspecified cerebrovascular disease: Secondary | ICD-10-CM | POA: Diagnosis not present

## 2016-11-17 NOTE — Progress Notes (Signed)
[facility] [address] [place]. [place] [address] [phone]       OFFICE FOLLOW-UP NOTE  Mr. [name] BELLAND, MD Date of Birth:  10-18-1929 Medical Record Number:  [id number]   HPI: 83 year old Caucasian male seen today for first office follow-up visit following [name] [name] hospital admission in February 2017 for TIA. 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 He has no previous history of stroke nor TIA. INR was 1.9. CT scan of his head showed no acute intracranial abnormality. CT angiogram of head and neck showed a right M2 branch flow gap of 3 mm with preserved flow distally. Nondominant vertebral artery showed moderate to severe stenosis at the origin and in the distal left V4 segment. Patient's deficits cleared fairly rapidly. NIH stroke score was 4 at the time of his initial evaluation. Patient subsequently became clear all deficits with no intervention. He was LSN 6:15 PM on 03/31/2015. Patient was not administered TPA secondary to Rapid resolution of deficits; INR of 1.9. He was admitted for further evaluation and treatment. CT head on admission showed an old lacunar infarct in left basal ganglia and mild age-related cortical atrophy. There is evidence of severe pansinusitis. CT angiogram was negative for any large vessel occlusion but did show a right M2 middle cerebral artery branch occlusion. MRI scan of the brain showed small hemorrhagic infarct in the right parietal region which is consistent with the area of the right M2 branch occlusion. Patient remained stable during the hospital stay. LDL cholesterol 72. Hemoglobin A1c was 6. Transthoracic  echo was not repeated as he had previous one in July 2016 which was normal. Patient was changed from warfarin to eliquis for anticoagulation and the time of discharge. States his done well since discharge. He is had no recurrent symptoms. He is tolerating eliquis well without bruising or bleeding. He has seen his cardiologist Dr. Aundra Dubin follow-up. He states his blood pressure is well controlled and today it is 1-3/67 in office. He is tolerating Tikosyn and has stayed in sinus rhythm. Update 11/19/2015 ; Dr. [name] returns for follow-up today after last visit with me 6 months ago. Congestive do well without recurrent stroke or TIA symptoms. He remains on eliquis which is tolerating well without significant bleeding and only minor bruising. He remains quite active and does play golf 3-4 times per week. His bottle slightly better in the 90s and his ankle. He states his blood pressure is well controlled though today it is slightly elevated at 145/70. He remains on Lipitor which is tolerating well without muscle aches and pains and states his last lipid profile checked by primary physician was satisfactory. He was admitted briefly in August this year for with viral pneumonia but has recovered well from that. He has no complaints today. He continues to have regular follow-up visits with his primary physician and cardiologist. Update 11/17/2016 ; he returns for follow-up after last visit a year ago. He continues to do well and has not had any recurrent TIA or stroke symptoms. Is tolerating eliquis well without bleeding and only minor bruising. His blood pressure is well controlled and today it is slightly borderline at [address]. He is tolerating Lipitor well without muscle  aches and pains. Lipid profile last checked in November last year was satisfactory. The atrial fibrillation is also under good control. Denies any memory difficulties or any new neurological complaints. He remains independent in activities of daily  living. He has not had carotid ultrasound checked last couple of years. ROS:   14 system review of systems is positive for  no complaints today  PMH:  Past Medical History:  Diagnosis Date  . Allergic rhinitis   . Anxiety   . Asthma   . Atrial fibrillation ([place])   . CAD (coronary artery disease)   . Depression   . Diverticulosis 03/16/1995  . DJD (degenerative joint disease)    "most joints" (03/11/2015)  . GERD (gastroesophageal reflux disease) 12/30/2000  . Heart attack ([place]) 1984  . Hiatal hernia 12/30/2000  . History of blood transfusion 1988   "related to GI bleeding OR"  . History of duodenal ulcer 10/15/1986  . Hyperlipidemia   . Hypertension   . Sinus bradycardia   . Stroke [facility])     Social History:  Social History   Social History  . Marital status: Married    Spouse name: N/A  . Number of children: 2  . Years of education: N/A   Occupational History  . DOCTOR Retired   Social History Main Topics  . Smoking status: Never Smoker  . Smokeless tobacco: Never Used  . Alcohol use No  . Drug use: No  . Sexual activity: Not Currently   Other Topics Concern  . Not on file   Social History Narrative   Married. Regular exercise - yes.     Medications:   Current Outpatient Prescriptions on File Prior to Visit  Medication Sig Dispense Refill  . atenolol (TENORMIN) 25 MG tablet Take 25 mg by mouth 2 (two) times daily.    Marland Kitchen atorvastatin (LIPITOR) 20 MG tablet TAKE 1 TABLET(20 MG) BY MOUTH DAILY 90 tablet 4  . azelastine (ASTELIN) 0.1 % nasal spray Place 2 sprays into both nostrils daily. Use in each nostril as directed    . beclomethasone (BECONASE-AQ) 42 MCG/SPRAY nasal spray Place 1 spray into both nostrils daily. Dose is for each nostril.     Marland Kitchen buPROPion (WELLBUTRIN SR) 150 MG 12 hr tablet Take 1 tablet (150 mg total) by mouth daily. 100 tablet 4  . cetirizine (ZYRTEC) 10 MG tablet Take 10 mg by mouth daily with supper.     . Cholecalciferol (VITAMIN D) 2000  units tablet Take 2,000 Units by mouth daily.    Marland Kitchen diltiazem (CARDIZEM) 30 MG tablet Take 1 tablet every 4 hours AS NEEDED for afib heart rate >100 45 tablet 2  . dofetilide (TIKOSYN) 250 MCG capsule TAKE 1 CAPSULE(250 MCG) BY MOUTH TWICE DAILY 180 capsule 1  . ELIQUIS 5 MG TABS tablet TAKE 1 TABLET(5 MG) BY MOUTH TWICE DAILY 60 tablet 6  . Fluticasone Furoate (ARNUITY ELLIPTA) 200 MCG/ACT AEPB Inhale 1 puff into the lungs daily. 30 each 11  . furosemide (LASIX) 20 MG tablet Take 1 tablet (20 mg total) by mouth daily as needed (ankle swelling). 100 tablet 3  . guaiFENesin (MUCINEX) 600 MG 12 hr tablet Take 1 tablet (600 mg total) by mouth 2 (two) times daily. (Patient taking differently: Take 600 mg by mouth as needed. ) 30 tablet 0  . levalbuterol (XOPENEX HFA) 45 MCG/ACT inhaler Inhale 1 puff into the lungs every 4 (four) hours as needed for wheezing or shortness of breath.    Marland Kitchen  LORazepam (ATIVAN) 1 MG tablet TAKE 1/2 TO 1 TABLET BY MOUTH EVERY NIGHT AT BEDTIME WHEN NECESSARY. 60 tablet 0  . nitroGLYCERIN (NITROSTAT) 0.4 MG SL tablet Place 1 tablet (0.4 mg total) under the tongue every 5 (five) minutes x 3 doses as needed for chest pain. 25 tablet 2  . omeprazole (PRILOSEC OTC) 20 MG tablet Take 1 tablet (20 mg total) by mouth daily. 100 tablet 4  . rifaximin (XIFAXAN) 550 MG TABS tablet Take 1 tablet (550 mg total) by mouth 2 (two) times daily. 28 tablet 0  . tamsulosin (FLOMAX) 0.4 MG CAPS capsule TAKE 1 CAPSULE BY MOUTH DAILY 90 capsule 4  . traMADol (ULTRAM) 50 MG tablet TAKE 1 TABLET BY MOUTH EVERY 12 HOURS AS NEEDED. 60 tablet 5   No current facility-administered medications on file prior to visit.     Allergies:   Allergies  Allergen Reactions  . Advil [Ibuprofen] Nausea Only and Rash  . Asa [Aspirin] Rash  . Tylenol [Acetaminophen] Rash    Physical Exam General: well developed, well nourished elderly Caucasian male, seated, in no evident distress Head: head normocephalic and  atraumatic.  Neck: supple with no carotid or supraclavicular bruits Cardiovascular: regular rate and rhythm, no murmurs Musculoskeletal: no deformity Skin:  no rash/petichiae Vascular:  Normal pulses all extremities Vitals:   11/17/16 0956  BP: (!) 141/74  Pulse: 76   Neurologic Exam Mental Status: Awake and fully alert. Oriented to place and time. Recent and remote memory intact. Attention span, concentration and fund of knowledge appropriate. Mood and affect appropriate.  Cranial Nerves: Fundoscopic exam Not done. Pupils equal, briskly reactive to light. Extraocular movements full without nystagmus. Visual fields full to confrontation. Hearing intact. Facial sensation intact. Face, tongue, palate moves normally and symmetrically.  Motor: Normal bulk and tone. Normal strength in all tested extremity muscles. Sensory.: intact to touch ,pinprick .position and vibratory sensation.  Coordination: Rapid alternating movements normal in all extremities. Finger-to-nose and heel-to-shin performed accurately bilaterally. Gait and Station: Arises from chair without difficulty. Stance is normal. Gait demonstrates normal stride length and balance but favors left knee due to pain . Able to heel, toe and tandem walk with slight difficulty.  Reflexes: 1+ and symmetric. Toes downgoing.      ASSESSMENT: 84 year old Caucasian male with embolic small right parietal hemorrhagic infarct in February 2017 second to atrial fibrillation with slightly suboptimal anticoagulation on warfarin (INR 1.9). Is doing well without recurrent symptoms    PLAN: I had a long d/w patient about his remote stroke, atrial fibrillation,risk for recurrent stroke/TIAs, personally independently reviewed imaging studies and stroke evaluation results and answered questions.Continue Eliquis (apixaban) daily  for secondary stroke prevention and maintain strict control of hypertension with blood pressure goal below 130/90, diabetes with  hemoglobin A1c goal below 6.5% and lipids with LDL cholesterol goal below 70 mg/dL. I also advised the patient to eat a healthy diet with plenty of whole grains, cereals, fruits and vegetables, exercise regularly and maintain ideal body weight . Check screening carotid ultrasound study as he has not had one in a couple of years. Greater than 50% time during this 25 minute visit was spent on counseling and coordination of care about his atrial fibrillation and stroke and answering questions No routine scheduled follow-up appointment is necessary in the future but he may return as needed Antony Contras, MD   Note: This document was prepared with digital dictation and possible smart phrase technology. Any transcriptional errors that result from this  process are unintentional

## 2016-11-17 NOTE — Patient Instructions (Signed)
I had a long d/w patient about his remote stroke, atrial fibrillation,risk for recurrent stroke/TIAs, personally independently reviewed imaging studies and stroke evaluation results and answered questions.Continue Eliquis (apixaban) daily  for secondary stroke prevention and maintain strict control of hypertension with blood pressure goal below 130/90, diabetes with hemoglobin A1c goal below 6.5% and lipids with LDL cholesterol goal below 70 mg/dL. I also advised the patient to eat a healthy diet with plenty of whole grains, cereals, fruits and vegetables, exercise regularly and maintain ideal body weight . Check screening carotid ultrasound study as he has not had one in a couple of years. No routine scheduled follow-up appointment is necessary in the future but he may return as needed

## 2016-11-21 ENCOUNTER — Other Ambulatory Visit: Payer: Self-pay | Admitting: Family Medicine

## 2016-11-29 ENCOUNTER — Ambulatory Visit (HOSPITAL_COMMUNITY)
Admission: RE | Admit: 2016-11-29 | Discharge: 2016-11-29 | Disposition: A | Discharge status: Home / Self Care | Payer: PPO | Source: Ambulatory Visit | Attending: Neurology | Admitting: Neurology

## 2016-11-29 ENCOUNTER — Encounter (HOSPITAL_COMMUNITY): Payer: PPO

## 2016-11-29 DIAGNOSIS — I6990 Unspecified sequelae of unspecified cerebrovascular disease: Secondary | ICD-10-CM | POA: Diagnosis not present

## 2016-11-29 NOTE — Progress Notes (Signed)
VASCULAR LAB PRELIMINARY  PRELIMINARY  PRELIMINARY  PRELIMINARY  Carotid duplex completed.    Preliminary report:  Bilateral:  1-39% ICA stenosis.  Vertebral artery flow is antegrade.     Lenville Hibberd, RVS 11/29/2016, 2:39 PM

## 2016-11-30 ENCOUNTER — Telehealth: Payer: Self-pay

## 2016-11-30 LAB — VAS US CAROTID
LCCADSYS: -67 cm/s
LCCAPDIAS: 10 cm/s
LCCAPSYS: 91 cm/s
LEFT ECA DIAS: -7 cm/s
LEFT VERTEBRAL DIAS: 8 cm/s
LICAPSYS: -36 cm/s
Left CCA dist dias: -15 cm/s
Left ICA dist dias: -22 cm/s
Left ICA dist sys: -70 cm/s
Left ICA prox dias: -11 cm/s
RCCAPSYS: 56 cm/s
RIGHT ECA DIAS: -7 cm/s
RIGHT VERTEBRAL DIAS: -15 cm/s
Right CCA prox dias: 9 cm/s

## 2016-11-30 NOTE — Telephone Encounter (Signed)
Rn call patient,and he was playing golf. Rn stated to wife on DPR that carotid ultrasound was normal. Pt's wife will tell her husband .She verbalized understanding.

## 2016-11-30 NOTE — Telephone Encounter (Signed)
-----   Message from Garvin Fila, MD sent at 11/30/2016  8:18 AM EDT ----- Candidate patient that carotid ultrasound study on 11/29/16 was normal

## 2016-12-10 ENCOUNTER — Other Ambulatory Visit: Payer: Self-pay | Admitting: Family Medicine

## 2016-12-10 DIAGNOSIS — I10 Essential (primary) hypertension: Secondary | ICD-10-CM

## 2016-12-13 ENCOUNTER — Ambulatory Visit: Payer: PPO | Admitting: Internal Medicine

## 2016-12-13 ENCOUNTER — Encounter: Payer: Self-pay | Admitting: Internal Medicine

## 2016-12-13 VITALS — BP 130/64 | HR 64 | Ht 69.5 in | Wt 156.8 lb

## 2016-12-13 DIAGNOSIS — J4530 Mild persistent asthma, uncomplicated: Secondary | ICD-10-CM

## 2016-12-13 DIAGNOSIS — R0982 Postnasal drip: Secondary | ICD-10-CM

## 2016-12-13 MED ORDER — FLUTICASONE FUROATE 200 MCG/ACT IN AEPB: 1.0000 | INHALATION_SPRAY | Freq: Every day | RESPIRATORY_TRACT | 1 refills | Status: DC

## 2016-12-13 NOTE — Addendum Note (Signed)
Addended by: Rosana Berger on: 12/13/2016 02:52 PM   Modules accepted: Orders

## 2016-12-13 NOTE — Patient Instructions (Addendum)
ICD-10-CM   1. Mild persistent asthma without complication R75.43   2. Post-nasal drip R09.82     Well controlled stable disease Glad you had flu shot already  Plan - continue arnuity daily; will send 12 refills - albuterol as needed - continue nasonex  Followup 9 months or sooner if needed

## 2016-12-13 NOTE — Progress Notes (Signed)
Subjective:     Patient ID: [name] Spence, MD, male   DOB: 1930/02/07, 83 y.o.   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 Followed in past for asthma w/ bronchitis. LOV around 2008. Medical hx CAD/ MI/CABG, AFib/ warfarin, diastolic dysfunction, GERD He did well for several years with help from Dr Sherren Mocha. Starting about 6 months ago with no obvious trigger, he began having increased wheeze, cough, needing SABA 2-3x/ day and Qvar only intermittently. He has been worse this Spring, blaming pollen. Sputum remains clear. Denies fever, nodes, blood, edema, chest pain. Hx  Intermittent AFib- no pacemaker Had BCG in medical school.         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 Followed in past for asthma w/ bronchitis.  Medical hx CAD/ MI/CABG,P AFib/ warfarin, diastolic dysfunction, GERD. Dr Coralie Keens alerted [place] that pt was having more trouble, so we could work him in today. Follows For: Pt c/o wheezing and SOB, prod cough with thick yellow mucus. States he had asthma flare up last night. Has has had 3 episodes of A-fib in last week, most recent was 06/18/14.  Persistent variable chest rattle has been going on almost since he was last seen here. Using albuterol rescue inhaler once or twice daily. He has caught a cold from his wife and overnight began or persistent wheeze, using rescue inhaler 4 times daily and continuing his Qvar. Transient relief only. Sputum trace yellow. No fever. He has a little left over prednisone but reports that 20 mg of prednisone daily is enough of a stimulant to trigger his atrial fib.  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  Medical hx CAD/ MI/CABG,P AFib/ warfarin, diastolic dysfunction, GERD Follow For:  Pt doing well since last visit. Still post nasal drip. Denies wheezing, SOB, Prod cough.  Has not needed rescue inhaler in months and denies wheeze since a cold in early spring. Using Qvar twice daily and Singulair. Notices some hoarseness, mouth breathing  Told he has some pulmonary hypertension. We discussed possible role of oxygen desaturation. Does not recognize symptoms of sleep apnea. CXR 06/24/14 IMPRESSION: No active cardiopulmonary disease. Electronically Signed  By: Franchot Gallo M.D.  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 Medical history CAD/MI/CABG, P A. fib/warfarin, diastolic dysfunction, GERD FOLLOWS FOR: Pt states he has had very little SOB or wheezing since last OV. Has had trouble with Afib. He has continued Flovent 110 and pro air rescue inhaler twice daily each. Has not recognized any association of atrial fib or palpitations with use of albuterol. [name] last year with no particular benefit recognized over his current regimen. He asks about persistent hoarseness. Overnight Oximetry 09/30/2014-normal, not qualifying for sleep O2   OV 10/23/2015  Chief Complaint  Patient presents with  . Hospitalization Follow-up    Pt recently hospitalized for LLL CAP. Pt c/o prod cough with green, blood streaked mucus.  Pt discharged yesterday.     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 In June 2017 he had worsening asthma symptoms and he saw Dr. Hardie Pulley and allergy. According to his history exhaled nitric oxide at this visit was elevated at 70 ppb. He was then started on inhaled corticosteroid ARNUITY . Marland Kitchen Up until that point he was only taking her steroids as  needed. After that he started taking her steroids regular basis. At follow-up his   He maintain himself on the inhaled steroid. Apparently blood allergy panel was negative. He was then leading his baseline life with control of atrial fibrillation.  However on 10/19/2015 he got hospitalized for left lower lobe pneumonia. Personally visualized the chest x-ray review the records and agree with the findings. It sounds like he had bacterial pneumonia not otherwise specified. He was discharged yesterday. He still on Augmentin. He has postnasal drainage but is not taking any nasal steroids. He has nocturnal acid reflux and is on fish oil. He is feeling back to baseline other than some post pneumonia fatigue. He also feels his atrial fibrillation is somewhat out of control. He has some occasional streaky hemoptysis but this is improving.   OV 12/03/2015  Chief Complaint  Patient presents with  . Follow-up    Pt states his SOB is at baseline - doing well. Pt denies cough, CP/tightness, and f/c/s.     Follow-up left lower lobe pneumonia.  He was hospitalized 10/19/2015 for left lower lobe pneumonia. This is clinical follow-up for the same. He no longer has hemoptysis since his last office visit mid September 2017. He continues to feel well. He is asymptomatic without any shortness of breath wheezing or chest tightness or cough. Only she is was nasal drip that bothers him. He tells me that a year ago he had sinus scan and it showed significant sinus congestion. He is followed up with ENT in the past but is not in a while. Inhaled steroids to the nose helping him but not fully controlled this. He says he will follow-up with ENT for the same. But at this point overall he is feeling fine and playing golf. He is compliant with his inhaled corticosteroid ARNUITY. He will need a chest x-ray today. He is up-to-date with his flu shot.   In interim he did see Dr Aundra Dubin for  Afib and notes reviewed   [place] 06/07/2016  Chief Complaint  Patient presents with  . Follow-up    Pt states his breathing has been doing well. Pt c/o prod cough with clear mucus in morning - baseline for pt. Pt denies CP/tightness and f/c/s.    Follow-up mild persistent asthma on inhaled  corticosteroid ARNUITY: He is currently feeling well. Symptoms are extremely well controlled. He visited his allergist Dr. Orvil Feil in March 2018 and his nitric oxide was 17 according to his history. There no new issues. He wants samples of his inhaled steroid because he is in the donut hole  Follow-up allergic rhinitis: This continues to bother him. It is not any worse. It is stable and mild. He is on inhaled nasal steroid and inhaled antihistamine. Despite nasal drainage and he coughs early in the.  morning . He is willing to add on nasal saline  Follow-up pneumonia: He had this last year: Follow-up chest x-ray February 2018 showed resolution. We recommended a CT scan chest for follow-up but he opted to have a chest x-ray in instead    COMPARISON:  Chest CT 01/05/2016.  Chest radiographs 12/03/2015. IMPRESSION: 1. Previous bilateral bronchopneumonia appears resolved. No acute cardiopulmonary abnormality. 2.  Calcified aortic atherosclerosis.   Electronically Signed   By: Genevie Ann M.D.   On: 04/02/2016 12:01    OV 12/13/2016  Chief Complaint  Patient presents with  . Follow-up    Pt states that he has been doing good; states he has not had any flare-ups  with his asthma. Denies an cough, SOB, or CP.    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 Last seen 6 months ago. He continues to do well asthma control questionnaire is 0 out of 5. He does not have any nocturnal symptoms. When he wakes up he does not have any symptoms is not limited in his activities because of asthma. Is not having shortness of breath or wheezing or nocturnal symptoms no albuterol rescue use. He is up-to-date with his flu shot he continues to play golf. He is okay spacing out his follow-ups for every 9 months    has a past medical history of Allergic rhinitis, Anxiety, Asthma, Atrial fibrillation ([place]), CAD  (coronary artery disease), Depression, Diverticulosis (03/16/1995), DJD (degenerative joint disease), GERD (gastroesophageal reflux disease) (12/30/2000), Heart attack ([place]) (1984), Hiatal hernia (12/30/2000), History of blood transfusion (1988), History of duodenal ulcer (10/15/1986), Hyperlipidemia, Hypertension, Sinus bradycardia, and Stroke ([place]).   reports that  has never smoked. he has never used smokeless tobacco.  Past Surgical History:  Procedure Laterality Date  . CARDIAC CATHETERIZATION    . CATARACT EXTRACTION W/ INTRAOCULAR LENS  IMPLANT, BILATERAL Bilateral ?2013  . CHOLECYSTECTOMY OPEN  1989  . CORONARY ANGIOPLASTY WITH STENT PLACEMENT  2010  . hospital ccu  1984   heart attack, hypoplastic right coronary  . INGUINAL HERNIA REPAIR Right ~ 1990  . NASAL SEPTUM SURGERY  1972  . TONSILLECTOMY    . TOTAL KNEE ARTHROPLASTY Bilateral 1998-2004   "right-left"  . VAGOTOMY AND PYLOROPLASTY  1988   "bleeding duodenal ulcer"    Allergies  Allergen Reactions  . Advil [Ibuprofen] Nausea Only and Rash  . Asa [Aspirin] Rash  . Tylenol [Acetaminophen] Rash    Immunization History  Administered Date(s) Administered  . Influenza Split 10/23/2012, 10/02/2015  . Influenza Whole 10/27/2007, 11/13/2008, 10/29/2009, 11/09/2010  . Influenza, High Dose Seasonal PF 10/28/2016  . Influenza-Unspecified 12/09/2013, 10/28/2014  . Pneumococcal Conjugate-13 09/17/2013  . Pneumococcal Polysaccharide-23 08/30/2000, 11/29/2005  . Td 06/20/2007  . Zoster 06/20/2007    Family History  Problem Relation Age of Onset  . Coronary artery disease Mother        deceased  . Deep vein thrombosis Sister   . Coronary artery disease Brother   . Asthma Brother   . Colon cancer Neg Hx      Current Outpatient Medications:  .  ARNUITY ELLIPTA 200 MCG/ACT AEPB, INHALE 1 PUFF INTO THE LUNGS DAILY, Disp: 3 each, Rfl: 4 .  atenolol (TENORMIN) 25 MG tablet, TAKE 1 TABLET(25 MG) BY MOUTH TWICE DAILY, Disp: 180  tablet, Rfl: 0 .  atorvastatin (LIPITOR) 20 MG tablet, TAKE 1 TABLET(20 MG) BY MOUTH DAILY, Disp: 90 tablet, Rfl: 4 .  buPROPion (WELLBUTRIN SR) 150 MG 12 hr tablet, Take 1 tablet (150 mg total) by mouth daily., Disp: 100 tablet, Rfl: 4 .  cetirizine (ZYRTEC) 10 MG tablet, Take 10 mg by mouth daily with supper. , Disp: , Rfl:  .  Cholecalciferol (VITAMIN D) 2000 units tablet, Take 2,000 Units by mouth daily., Disp: , Rfl:  .  diltiazem (CARDIZEM) 30 MG tablet, Take 1 tablet every 4 hours AS NEEDED for afib heart rate >100, Disp: 45 tablet, Rfl: 2 .  dofetilide (TIKOSYN) 250 MCG capsule, TAKE 1 CAPSULE(250 MCG) BY MOUTH TWICE DAILY, Disp: 180 capsule, Rfl: 1 .  ELIQUIS 5 MG TABS tablet, TAKE 1 TABLET(5 MG) BY MOUTH TWICE  DAILY, Disp: 60 tablet, Rfl: 6 .  levalbuterol (XOPENEX HFA) 45 MCG/ACT inhaler, Inhale 1 puff into the lungs every 4 (four) hours as needed for wheezing or shortness of breath., Disp: , Rfl:  .  LORazepam (ATIVAN) 1 MG tablet, TAKE 1/2 TO 1 TABLET BY MOUTH EVERY NIGHT AT BEDTIME WHEN NECESSARY., Disp: 60 tablet, Rfl: 0 .  mometasone (NASONEX) 50 MCG/ACT nasal spray, Place 2 sprays daily into the nose., Disp: , Rfl:  .  nitroGLYCERIN (NITROSTAT) 0.4 MG SL tablet, Place 1 tablet (0.4 mg total) under the tongue every 5 (five) minutes x 3 doses as needed for chest pain., Disp: 25 tablet, Rfl: 2 .  omeprazole (PRILOSEC OTC) 20 MG tablet, Take 1 tablet (20 mg total) by mouth daily., Disp: 100 tablet, Rfl: 4 .  tamsulosin (FLOMAX) 0.4 MG CAPS capsule, TAKE 1 CAPSULE BY MOUTH DAILY, Disp: 90 capsule, Rfl: 4 .  traMADol (ULTRAM) 50 MG tablet, TAKE 1 TABLET BY MOUTH EVERY 12 HOURS AS NEEDED., Disp: 60 tablet, Rfl: 5   Review of Systems     Objective:   Physical Exam  Constitutional: He is oriented to person, place, and time. He appears well-developed and well-nourished. No distress.  Obese, sitting in wheel chair, talkative today, nasal cannula on  HENT:  Head: Normocephalic and  atraumatic.  Right Ear: External ear normal.  Left Ear: External ear normal.  Mouth/Throat: Oropharynx is clear and moist. No oropharyngeal exudate.  Mild post nasal drip +  Eyes: Conjunctivae and EOM are normal. Pupils are equal, round, and reactive to light. Right eye exhibits no discharge. Left eye exhibits no discharge. No scleral icterus.  Neck: Normal range of motion. Neck supple. No JVD present. No tracheal deviation present. No thyromegaly present.  Cardiovascular: Normal rate, regular rhythm and intact distal pulses. Exam reveals no gallop and no friction rub.  No murmur heard. Pulmonary/Chest: Effort normal and breath sounds normal. No respiratory distress. He has no wheezes. He has no rales. He exhibits no tenderness.  Abdominal: Soft. Bowel sounds are normal. He exhibits no distension and no mass. There is no tenderness. There is no rebound and no guarding.  Musculoskeletal: Normal range of motion. He exhibits edema. He exhibits no tenderness.  ++ edema  Lymphadenopathy:    He has no cervical adenopathy.  Neurological: He is alert and oriented to person, place, and time. He has normal reflexes. No cranial nerve deficit. Coordination normal.  Skin: Skin is warm and dry. No rash noted. He is not diaphoretic. No erythema. No pallor.  Psychiatric: He has a normal mood and affect. His behavior is normal. Judgment and thought content normal.  Nursing note and vitals reviewed.   Vitals:   12/13/16 1416  BP: 130/64  Pulse: 64  SpO2: 100%  Weight: 156 lb 12.8 oz (71.1 kg)  Height: 5' 9.5" (1.765 m)    Estimated body mass index is 22.82 kg/m as calculated from the following:   Height as of this encounter: 5' 9.5" (1.765 m).   Weight as of this encounter: 156 lb 12.8 oz (71.1 kg).      Assessment:       ICD-10-CM   1. Mild persistent asthma without complication V03.50   2. Post-nasal drip R09.82        Plan:      Well controlled stable disease Glad you had flu shot  already  Plan - continue arnuity daily; will send 12 refills - albuterol as needed - continue nasonex  Followup  9 months or sooner if needed   Dr. Brand Males, M.D., [facility].C.P Pulmonary and Critical Care Medicine Staff Physician [facility] Pager: [phone], If no answer or between  15:00h - 7:00h: call [phone]  12/13/2016 2:47 PM

## 2016-12-22 DIAGNOSIS — J4530 Mild persistent asthma, uncomplicated: Secondary | ICD-10-CM | POA: Diagnosis not present

## 2016-12-22 DIAGNOSIS — J309 Allergic rhinitis, unspecified: Secondary | ICD-10-CM | POA: Diagnosis not present

## 2017-01-04 ENCOUNTER — Encounter: Payer: Self-pay | Admitting: Family Medicine

## 2017-01-04 ENCOUNTER — Ambulatory Visit: Payer: PPO | Admitting: Family Medicine

## 2017-01-04 VITALS — BP 90/60 | HR 82 | Wt 154.0 lb

## 2017-01-04 DIAGNOSIS — E7800 Pure hypercholesterolemia, unspecified: Secondary | ICD-10-CM | POA: Diagnosis not present

## 2017-01-04 DIAGNOSIS — N401 Enlarged prostate with lower urinary tract symptoms: Secondary | ICD-10-CM

## 2017-01-04 DIAGNOSIS — J45909 Unspecified asthma, uncomplicated: Secondary | ICD-10-CM

## 2017-01-04 DIAGNOSIS — K219 Gastro-esophageal reflux disease without esophagitis: Secondary | ICD-10-CM

## 2017-01-04 DIAGNOSIS — I480 Paroxysmal atrial fibrillation: Secondary | ICD-10-CM | POA: Diagnosis not present

## 2017-01-04 DIAGNOSIS — R351 Nocturia: Secondary | ICD-10-CM | POA: Diagnosis not present

## 2017-01-04 DIAGNOSIS — I10 Essential (primary) hypertension: Secondary | ICD-10-CM | POA: Diagnosis not present

## 2017-01-04 DIAGNOSIS — R269 Unspecified abnormalities of gait and mobility: Secondary | ICD-10-CM | POA: Diagnosis not present

## 2017-01-04 LAB — POCT URINALYSIS DIPSTICK
BILIRUBIN UA: NEGATIVE
Blood, UA: NEGATIVE
GLUCOSE UA: NEGATIVE
LEUKOCYTES UA: NEGATIVE
Nitrite, UA: NEGATIVE
Spec Grav, UA: 1.025 (ref 1.010–1.025)
Urobilinogen, UA: 0.2 E.U./dL
pH, UA: 6 (ref 5.0–8.0)

## 2017-01-04 LAB — HEPATIC FUNCTION PANEL
ALK PHOS: 128 U/L — AB (ref 39–117)
ALT: 13 U/L (ref 0–53)
AST: 13 U/L (ref 0–37)
Albumin: 3.4 g/dL — ABNORMAL LOW (ref 3.5–5.2)
BILIRUBIN DIRECT: 0.1 mg/dL (ref 0.0–0.3)
BILIRUBIN TOTAL: 0.5 mg/dL (ref 0.2–1.2)
Total Protein: 6 g/dL (ref 6.0–8.3)

## 2017-01-04 LAB — LIPID PANEL
Cholesterol: 77 mg/dL (ref 0–200)
HDL: 30.3 mg/dL — ABNORMAL LOW (ref >39.00)
LDL Cholesterol: 33 mg/dL (ref 0–99)
NONHDL: 46.69
Total CHOL/HDL Ratio: 3
Triglycerides: 68 mg/dL (ref 0.0–149.0)
VLDL: 13.6 mg/dL (ref 0.0–40.0)

## 2017-01-04 LAB — TSH: TSH: 3.6 u[IU]/mL (ref 0.35–4.50)

## 2017-01-04 MED ORDER — LORAZEPAM 1 MG PO TABS: ORAL_TABLET | ORAL | 4 refills | Status: DC

## 2017-01-04 MED ORDER — NITROGLYCERIN 0.4 MG SL SUBL: 0.4000 mg | SUBLINGUAL_TABLET | SUBLINGUAL | 2 refills | Status: DC | PRN

## 2017-01-04 MED ORDER — ATORVASTATIN CALCIUM 20 MG PO TABS: ORAL_TABLET | ORAL | 4 refills | Status: DC

## 2017-01-04 MED ORDER — TAMSULOSIN HCL 0.4 MG PO CAPS: 0.4000 mg | ORAL_CAPSULE | Freq: Every day | ORAL | 4 refills | Status: DC

## 2017-01-04 MED ORDER — ATENOLOL 25 MG PO TABS: ORAL_TABLET | ORAL | 4 refills | Status: DC

## 2017-01-04 MED ORDER — BUPROPION HCL ER (SR) 150 MG PO TB12: 150.0000 mg | ORAL_TABLET | Freq: Every day | ORAL | 4 refills | Status: DC

## 2017-01-04 NOTE — Patient Instructions (Signed)
Continue current medications  Labs today........ I will call you if there is anything abnormal  Check with your insurance company to find out where he gets the new shingles vaccine  Tetanus booster in May  Return in one year for general physical exam sooner if any problems

## 2017-01-04 NOTE — Progress Notes (Signed)
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 fib allergic rhinitis the pH with outlet obstruction reflux esophagitis among other issues  For his atrial fibrillation he is on eliquis 5 mg twice a day, Tenormin 25 mg twice a day, Cardizem 30 mg every 4 hours if his heart rate goes above 100, TIA KOSYN, 250 mg twice a day. He occasionally has bouts of A. fib but these been fewer and far between this year.  He takes Lipitor 20 mg daily because of a history of hyperlipidemia and coronary artery disease  He takes Wellbutrin 150 mg daily because a history of mild depression  He takes fluticasone 1 puff daily because of a history of underlying asthma. He uses the aerosol-x every 4 hours when necessary  He takes Ativan 1/2-1 mg at bedtime when necessary for sleep  Takes Prilosec OTC 20 mg daily because of reflux  He takes Flomax 0.5 daily because a history of BPH and L obstruction  He takes tramadol 50 mg every 12 hours as needed for pain. He's got marked deformity of his right foot. He went to see Gardenia Phlegm he does have an insert which helps but he has a significant anatomic abnormality.  Vaccinations up-to-date he is due to tetanus booster in May  Information given on the new shingles vaccine  14 point review of systems June otherwise negative  He gets routine eye care......[name] [name] bilateral cataracts and lens implants 3 years ago...........[name] [name] regular dental care, colonoscopy is no longer indicated.  Cognitive function normal he plays golf 3 or 4 days a week, home health safety reviewed no issues identified, no guns in the house, he does have a healthcare power of attorney and living will.  BP 90/60 (BP Location: Left Arm, Patient Position: Sitting, Cuff Size: Normal)   Pulse 82   Wt 154 lb (69.9 kg)   SpO2 97%   BMI 22.42 kg/m  Well-developed well-nourished male no acute distress vital signs  stable pulse 82 and regular today  HEENT were negative except for evidence of cataract removal and lens implants. Neck was supple thyroid is not enlarged no carotid bruits cardiopulmonary exam normal except for some very faint wheezing on forced expiration. He states last Saturday he was on the cold and that triggered some wheezing.  Abdominal exam negative except for scar in the midline from previous surgery. Extremities normal skin normal peripheral pulses normal except for marked deformity of right foot  #1 hypertension at goal........ continue current therapy  #2 hyperlipidemia....... continue current therapy check labs  #3 A. Fib.......... Stable... Continue current treatment program  #4 mild asthma ....... continue inhalers as prescribed by pulmonary  #6 reflux esophagitis ........ continue Prilosec  #7 BPH with outlet obstruction ....... continue Flomax  #8 marked deformity right foot ....... continue insert followed by Dr. Gardenia Phlegm. ........[name] [name]

## 2017-02-09 ENCOUNTER — Other Ambulatory Visit: Payer: Self-pay | Admitting: Family Medicine

## 2017-03-15 ENCOUNTER — Other Ambulatory Visit: Payer: Self-pay | Admitting: Family Medicine

## 2017-03-15 DIAGNOSIS — I10 Essential (primary) hypertension: Secondary | ICD-10-CM

## 2017-03-16 ENCOUNTER — Other Ambulatory Visit: Payer: Self-pay

## 2017-03-16 DIAGNOSIS — I10 Essential (primary) hypertension: Secondary | ICD-10-CM

## 2017-03-16 MED ORDER — ATENOLOL 25 MG PO TABS: ORAL_TABLET | ORAL | 4 refills | Status: DC

## 2017-04-20 ENCOUNTER — Telehealth: Payer: Self-pay | Admitting: Family Medicine

## 2017-04-20 NOTE — Telephone Encounter (Signed)
Copied from [place] [id number]. Topic: Appointment Scheduling - Scheduling Inquiry for Clinic >> Apr 20, 2017 11:16 AM Cecelia Byars, NT wrote: Reason for CRM: Dr Caryn Section called and would like an appointment with Dr Tamala Julian as soon as possible please call him on his cell number

## 2017-04-20 NOTE — Telephone Encounter (Signed)
If same problem we can use the 2 pm blocked for tomorrow.  If new problem will need to wait til next week I think or cancellation

## 2017-04-21 NOTE — Telephone Encounter (Signed)
Spoke to pt, scheduled him for 3.18.19 @ 9am.

## 2017-04-24 NOTE — Progress Notes (Signed)
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  Found to have more of the [facility] arthritis and given injection Jul 06, 2016.  Patient states similar pain again.  Having increasing swelling.  Describes pain as a dull, throbbing aching sensation.  Patient denies any numbness or tingling.     Past Medical History:  Diagnosis Date  . Allergic rhinitis   . Anxiety   . Asthma   . Atrial fibrillation ([place])   . CAD (coronary artery disease)   . Depression   . Diverticulosis 03/16/1995  . DJD (degenerative joint disease)    "most joints" (03/11/2015)  . GERD (gastroesophageal reflux disease) 12/30/2000  . Heart attack ([place]) 1984  . Hiatal hernia 12/30/2000  . History of blood transfusion 1988   "related to GI bleeding OR"  . History of duodenal ulcer 10/15/1986  . Hyperlipidemia   . Hypertension   . Sinus bradycardia   . Stroke [facility])    Past Surgical History:  Procedure Laterality Date  . CARDIAC CATHETERIZATION    . CATARACT EXTRACTION W/ INTRAOCULAR LENS  IMPLANT, BILATERAL Bilateral ?2013  . CHOLECYSTECTOMY OPEN  1989  . CORONARY ANGIOPLASTY WITH STENT PLACEMENT  2010  . hospital ccu  1984   heart attack, hypoplastic right coronary  . INGUINAL HERNIA REPAIR Right ~ 1990  . NASAL SEPTUM SURGERY  1972  . TONSILLECTOMY    . TOTAL KNEE ARTHROPLASTY Bilateral 1998-2004   "right-left"  . VAGOTOMY AND PYLOROPLASTY  1988   "bleeding duodenal ulcer"   Social History   Socioeconomic History  . Marital status: Married    Spouse name: None  . Number of children: 2  . Years of education: None  . Highest education level: None  Social Needs  . Financial resource strain: None  . Food insecurity - worry: None  . Food insecurity - inability:  None  . Transportation needs - medical: None  . Transportation needs - non-medical: None  Occupational History  . Occupation: DOCTOR    Employer: RETIRED  Tobacco Use  . Smoking status: Never Smoker  . Smokeless tobacco: Never Used  Substance and Sexual Activity  . Alcohol use: No  . Drug use: No  . Sexual activity: Not Currently  Other Topics Concern  . None  Social History Narrative   Married. Regular exercise - yes.    Allergies  Allergen Reactions  . Advil [Ibuprofen] Nausea Only and Rash  . Asa [Aspirin] Rash  . Tylenol [Acetaminophen] Rash   Family History  Problem Relation Age of Onset  . Coronary artery disease Mother        deceased  . Deep vein thrombosis Sister   . Coronary artery disease Brother   . Asthma Brother   . Colon cancer Neg Hx      Past medical history, social, surgical and family history all reviewed in electronic medical record.  No pertanent information unless stated regarding to the chief complaint.   Review of Systems:Review of systems updated and as accurate as of 04/25/17  No headache, visual changes, nausea, vomiting, diarrhea, constipation, dizziness, abdominal pain, skin rash, fevers, chills, night sweats, weight loss, swollen lymph nodes, body aches, joint swelling, muscle aches, chest pain, shortness of  breath, mood changes.   Objective  Blood pressure 120/82, pulse 65, height 5' 9.5" (1.765 m), weight 153 lb (69.4 kg), SpO2 97 %. Systems examined below as of 04/25/17   General: No apparent distress alert and oriented x3 mood and affect normal, dressed appropriately.  HEENT: Pupils equal, extraocular movements intact  Respiratory: Patient's speak in full sentences and does not appear short of breath  Cardiovascular: No lower extremity edema, non tender, no erythema  Skin: Warm dry intact with no signs of infection or rash on extremities or on axial skeleton.  Abdomen: Soft nontender  Neuro: Cranial nerves II through XII are intact,  neurovascularly intact in all extremities with 2+ DTRs and 2+ pulses.  Lymph: No lymphadenopathy of posterior or anterior cervical chain or axillae bilaterally.  Gait normal with good balance and coordination.  MSK:  Non tender with full range of motion and good stability and symmetric strength and tone of shoulders, elbows, wrist, hip, knee and ankles bilaterally.  Arthritic changes of multiple joints Hand exam shows positive grind test of the [facility] joint right wrist.  Mild bruising noted surrounding the area near full range of motion and near full strength  Procedure: Real-time Ultrasound Guided Injection of right CMC joint Device: GE Logiq Q7  Ultrasound guided injection is preferred based studies that show increased duration, increased effect, greater accuracy, decreased procedural pain, increased response rate, and decreased cost with ultrasound guided versus blind injection.  Verbal informed consent obtained.  Time-out conducted.  Noted no overlying erythema, induration, or other signs of local infection.  Skin prepped in a sterile fashion.  Local anesthesia: Topical Ethyl chloride.  With sterile technique and under real time ultrasound guidance: With a 25-gauge half inch needle injected with 0.5 cc of 0.5% Marcaine and 0.5 cc of Kenalog 40 mg/mL. Completed without difficulty  Pain immediately resolved suggesting accurate placement of the medication.  Advised to call if fevers/chills, erythema, induration, drainage, or persistent bleeding.  Images permanently stored and available for review in the ultrasound unit.  Impression: Technically successful ultrasound guided injection.     Impression and Recommendations:     This case required medical decision making of moderate complexity.      Note: This dictation was prepared with Dragon dictation along with smaller phrase technology. Any transcriptional errors that result from this process are unintentional.

## 2017-04-25 ENCOUNTER — Ambulatory Visit: Payer: Self-pay

## 2017-04-25 ENCOUNTER — Ambulatory Visit: Payer: PPO | Admitting: Family Medicine

## 2017-04-25 ENCOUNTER — Encounter: Payer: Self-pay | Admitting: Family Medicine

## 2017-04-25 VITALS — BP 120/82 | HR 65 | Ht 69.5 in | Wt 153.0 lb

## 2017-04-25 DIAGNOSIS — M1811 Unilateral primary osteoarthritis of first carpometacarpal joint, right hand: Secondary | ICD-10-CM

## 2017-04-25 DIAGNOSIS — M25531 Pain in right wrist: Secondary | ICD-10-CM

## 2017-04-25 NOTE — Assessment & Plan Note (Signed)
Patient given injection.  Tolerated procedure well.  Discussed icing regimen and home exercises.  Discussed which activity to doing which wants to avoid.  Worsening symptoms may need to consider a larger needle for aspiration purposes.

## 2017-04-25 NOTE — Patient Instructions (Signed)
Good to see you  [name] [name] is your friend.  Sorry for being late You know the drill  See me again in 4 weeks in case I have to get the cyst

## 2017-05-02 DIAGNOSIS — N403 Nodular prostate with lower urinary tract symptoms: Secondary | ICD-10-CM | POA: Diagnosis not present

## 2017-05-02 DIAGNOSIS — N401 Enlarged prostate with lower urinary tract symptoms: Secondary | ICD-10-CM | POA: Diagnosis not present

## 2017-05-02 DIAGNOSIS — R351 Nocturia: Secondary | ICD-10-CM | POA: Diagnosis not present

## 2017-05-03 LAB — PSA: PSA: 22.7

## 2017-05-05 ENCOUNTER — Other Ambulatory Visit (HOSPITAL_COMMUNITY): Payer: Self-pay | Admitting: Nurse Practitioner

## 2017-05-06 ENCOUNTER — Telehealth: Payer: Self-pay | Admitting: *Deleted

## 2017-05-06 NOTE — Telephone Encounter (Signed)
   [facility] Pre-operative Risk Assessment    Request for surgical clearance:  What type of surgery is being performed? PROSTATE BIOPSY 1. When is this surgery scheduled?  TBD  2. What type of clearance is required (medical clearance vs. Pharmacy clearance to hold med vs. Both)? ELIQUIS  3. Are there any medications that need to be held prior to surgery and how long?ELIQUIS 2 DAYS  PRIOR  4. Practice name and name of physician performing surgery? [facility] DR Diona Fanti  What is your office phone and fax number? [place] [phone] 5. Anesthesia type (None, local, MAC, general) ?   Devra Dopp 05/06/2017, 12:56 PM  _________________________________________________________________   (provider comments below)

## 2017-05-09 ENCOUNTER — Encounter: Payer: Self-pay | Admitting: Family Medicine

## 2017-05-09 NOTE — Telephone Encounter (Signed)
Pt takes Eliquis for afib with CHADS2VASc score of 7 (age x2, HTN, CAD, TIA, CHF). SCr 1.26, CrCl 76mL/min. Typically recommend holding Eliquis for only 24 hours prior to procedure due to prior TIA, however CrCl is reduced and procedure has a higher bleed risk. Will discuss with Dr Aundra Dubin to see if he is ok with pt holding Eliquis for 2 days prior to prostate biopsy with TIA history.

## 2017-05-09 NOTE — Telephone Encounter (Signed)
Would hold Eliquis 24 hrs prior to biopsy.

## 2017-05-09 NOTE — Telephone Encounter (Signed)
Faxed this note to [facility] ./cy

## 2017-05-13 DIAGNOSIS — D075 Carcinoma in situ of prostate: Secondary | ICD-10-CM | POA: Diagnosis not present

## 2017-05-13 DIAGNOSIS — R972 Elevated prostate specific antigen [PSA]: Secondary | ICD-10-CM | POA: Diagnosis not present

## 2017-05-13 DIAGNOSIS — C61 Malignant neoplasm of prostate: Secondary | ICD-10-CM | POA: Diagnosis not present

## 2017-05-17 ENCOUNTER — Other Ambulatory Visit: Payer: Self-pay | Admitting: Urology

## 2017-05-17 DIAGNOSIS — C61 Malignant neoplasm of prostate: Secondary | ICD-10-CM

## 2017-05-18 DIAGNOSIS — C61 Malignant neoplasm of prostate: Secondary | ICD-10-CM | POA: Diagnosis not present

## 2017-05-20 ENCOUNTER — Encounter (HOSPITAL_COMMUNITY)
Admission: RE | Admit: 2017-05-20 | Discharge: 2017-05-20 | Disposition: A | Discharge status: Home / Self Care | Payer: PPO | Source: Ambulatory Visit | Attending: Urology | Admitting: Urology

## 2017-05-20 ENCOUNTER — Ambulatory Visit (HOSPITAL_COMMUNITY)
Admission: RE | Admit: 2017-05-20 | Discharge: 2017-05-20 | Disposition: A | Discharge status: Home / Self Care | Payer: PPO | Source: Ambulatory Visit | Attending: Urology | Admitting: Urology

## 2017-05-20 DIAGNOSIS — C61 Malignant neoplasm of prostate: Secondary | ICD-10-CM | POA: Insufficient documentation

## 2017-05-20 DIAGNOSIS — R937 Abnormal findings on diagnostic imaging of other parts of musculoskeletal system: Secondary | ICD-10-CM | POA: Diagnosis not present

## 2017-05-20 DIAGNOSIS — Z8546 Personal history of malignant neoplasm of prostate: Secondary | ICD-10-CM | POA: Diagnosis not present

## 2017-05-20 MED ORDER — TECHNETIUM TC 99M MEDRONATE IV KIT
20.6000 | PACK | Freq: Once | INTRAVENOUS | Status: AC | PRN
  Administered 2017-05-20: 20.6 via INTRAVENOUS

## 2017-05-23 ENCOUNTER — Ambulatory Visit: Payer: PPO | Admitting: Family Medicine

## 2017-05-25 ENCOUNTER — Telehealth (HOSPITAL_COMMUNITY): Payer: Self-pay | Admitting: *Deleted

## 2017-05-25 NOTE — Telephone Encounter (Signed)
With history of CVA, would stop Eliquis for minimal amount of time, can hold 2 days prior then start back asap afterwards. OK for procedure.

## 2017-05-25 NOTE — Telephone Encounter (Signed)
Patient called to afib clinic stating he needs to have another urological procedure through [facility]. Surgical procedure cannot be scheduled until they have the following information.  States he needs a note sent for surgical clearance as well as eliquis recommendations from Dr. Aundra Dubin. Will forward information to Dr. Aundra Dubin attention and his RN to forward to [facility].

## 2017-05-26 NOTE — Telephone Encounter (Signed)
Per Susie note is in Dr.McLeans folder to sign

## 2017-05-29 ENCOUNTER — Other Ambulatory Visit: Payer: Self-pay | Admitting: Sports Medicine

## 2017-05-30 ENCOUNTER — Other Ambulatory Visit (HOSPITAL_COMMUNITY): Payer: PPO

## 2017-05-30 ENCOUNTER — Other Ambulatory Visit: Payer: Self-pay | Admitting: Urology

## 2017-05-30 ENCOUNTER — Ambulatory Visit (HOSPITAL_COMMUNITY): Payer: PPO

## 2017-06-07 NOTE — Patient Instructions (Addendum)
[name] Spence, MD  06/07/2017   Your procedure is scheduled on: 06-10-17   Report to [place]  Entrance    Report to admitting at 10:00 AM    Call this number if you have problems the morning of surgery [phone]   Remember: Do not eat food or drink liquids :After Midnight.     Take these medicines the morning of surgery with A SIP OF WATER: Atenolol (Tenormin), Bupropion (Wellbutrin), Diltiazem (Cardizem, if needed), Omeprazole (Prilosec). You may also bring and use your inhaler as needed.                                You may not have any metal on your body including hair pins and              piercings  Do not wear jewelry, lotions, powders  deodorant             Men may shave face and neck.   Do not bring valuables to the hospital. [place].  Contacts, dentures or bridgework may not be worn into surgery.       Patients discharged the day of surgery will not be allowed to drive home.  Name and phone number of your driver: [name] [name] [phone]               Please read over the following fact sheets you were given: _____________________________________________________________________           [facility] - Preparing for Surgery Before surgery, you can play an important role.  Because skin is not sterile, your skin needs to be as free of germs as possible.  You can reduce the number of germs on your skin by washing with CHG (chlorahexidine gluconate) soap before surgery.  CHG is an antiseptic cleaner which kills germs and bonds with the skin to continue killing germs even after washing. Please DO NOT use if you have an allergy to CHG or antibacterial soaps.  If your skin becomes reddened/irritated stop using the CHG and inform your nurse when you arrive at Short Stay. Do not shave (including legs and underarms) for at least 48 hours prior to the first CHG shower.  You may shave your  face/neck. Please follow these instructions carefully:  1.  Shower with CHG Soap the night before surgery and the  morning of Surgery.  2.  If you choose to wash your hair, wash your hair first as usual with your  normal  shampoo.  3.  After you shampoo, rinse your hair and body thoroughly to remove the  shampoo.                           4.  Use CHG as you would any other liquid soap.  You can apply chg directly  to the skin and wash                       Gently with a scrungie or clean washcloth.  5.  Apply the CHG Soap to your body ONLY FROM THE NECK DOWN.   Do not use on face/ open  Wound or open sores. Avoid contact with eyes, ears mouth and genitals (private parts).                       Wash face,  Genitals (private parts) with your normal soap.             6.  Wash thoroughly, paying special attention to the area where your surgery  will be performed.  7.  Thoroughly rinse your body with warm water from the neck down.  8.  DO NOT shower/wash with your normal soap after using and rinsing off  the CHG Soap.                9.  Pat yourself dry with a clean towel.            10.  Wear clean pajamas.            11.  Place clean sheets on your bed the night of your first shower and do not  sleep with pets. Day of Surgery : Do not apply any lotions/deodorants the morning of surgery.  Please wear clean clothes to the hospital/surgery center.  FAILURE TO FOLLOW THESE INSTRUCTIONS MAY RESULT IN THE CANCELLATION OF YOUR SURGERY PATIENT SIGNATURE_________________________________  NURSE SIGNATURE__________________________________  ________________________________________________________________________

## 2017-06-07 NOTE — Progress Notes (Signed)
  05-25-17 (Epic) Cardiac Clearance from Dr. Aundra Dubin in Telephone Encounter  09-29-16 (Epic) EKG  06-10-16 (Epic) ECHO

## 2017-06-08 ENCOUNTER — Encounter (HOSPITAL_COMMUNITY)
Admission: RE | Admit: 2017-06-08 | Discharge: 2017-06-08 | Disposition: A | Discharge status: Home / Self Care | Payer: PPO | Source: Ambulatory Visit | Attending: Urology | Admitting: Urology

## 2017-06-08 ENCOUNTER — Other Ambulatory Visit: Payer: Self-pay

## 2017-06-08 ENCOUNTER — Encounter (HOSPITAL_COMMUNITY): Payer: Self-pay

## 2017-06-08 DIAGNOSIS — I2510 Atherosclerotic heart disease of native coronary artery without angina pectoris: Secondary | ICD-10-CM | POA: Diagnosis not present

## 2017-06-08 DIAGNOSIS — I4891 Unspecified atrial fibrillation: Secondary | ICD-10-CM | POA: Diagnosis not present

## 2017-06-08 DIAGNOSIS — C7982 Secondary malignant neoplasm of genital organs: Secondary | ICD-10-CM | POA: Diagnosis not present

## 2017-06-08 DIAGNOSIS — E785 Hyperlipidemia, unspecified: Secondary | ICD-10-CM | POA: Diagnosis not present

## 2017-06-08 DIAGNOSIS — I10 Essential (primary) hypertension: Secondary | ICD-10-CM | POA: Diagnosis not present

## 2017-06-08 DIAGNOSIS — K219 Gastro-esophageal reflux disease without esophagitis: Secondary | ICD-10-CM | POA: Diagnosis not present

## 2017-06-08 DIAGNOSIS — F329 Major depressive disorder, single episode, unspecified: Secondary | ICD-10-CM | POA: Diagnosis not present

## 2017-06-08 DIAGNOSIS — Z79899 Other long term (current) drug therapy: Secondary | ICD-10-CM | POA: Diagnosis not present

## 2017-06-08 DIAGNOSIS — I252 Old myocardial infarction: Secondary | ICD-10-CM | POA: Diagnosis not present

## 2017-06-08 DIAGNOSIS — F419 Anxiety disorder, unspecified: Secondary | ICD-10-CM | POA: Diagnosis not present

## 2017-06-08 LAB — BASIC METABOLIC PANEL
Anion gap: 8 (ref 5–15)
BUN: 26 mg/dL — ABNORMAL HIGH (ref 6–20)
CO2: 26 mmol/L (ref 22–32)
Calcium: 9.3 mg/dL (ref 8.9–10.3)
Chloride: 105 mmol/L (ref 101–111)
Creatinine, Ser: 1.16 mg/dL (ref 0.61–1.24)
GFR, EST NON AFRICAN AMERICAN: 56 mL/min — AB (ref >60)
GLUCOSE: 135 mg/dL — AB (ref 65–99)
POTASSIUM: 5.5 mmol/L — AB (ref 3.5–5.1)
Sodium: 139 mmol/L (ref 135–145)

## 2017-06-08 LAB — CBC
HEMATOCRIT: 41.8 % (ref 39.0–52.0)
Hemoglobin: 13.5 g/dL (ref 13.0–17.0)
MCH: 30 pg (ref 26.0–34.0)
MCHC: 32.3 g/dL (ref 30.0–36.0)
MCV: 92.9 fL (ref 78.0–100.0)
Platelets: 221 10*3/uL (ref 150–400)
RBC: 4.5 MIL/uL (ref 4.22–5.81)
RDW: 14.8 % (ref 11.5–15.5)
WBC: 8.4 10*3/uL (ref 4.0–10.5)

## 2017-06-08 NOTE — Progress Notes (Signed)
06-08-17 BMP result routed to Dr. Diona Fanti for review.

## 2017-06-10 ENCOUNTER — Ambulatory Visit (HOSPITAL_COMMUNITY): Payer: PPO | Admitting: Certified Registered Nurse Anesthetist

## 2017-06-10 ENCOUNTER — Encounter (HOSPITAL_COMMUNITY): Payer: Self-pay | Admitting: *Deleted

## 2017-06-10 ENCOUNTER — Encounter (HOSPITAL_COMMUNITY)
Admission: RE | Disposition: A | Discharge status: Home / Self Care | Payer: Self-pay | Source: Ambulatory Visit | Attending: Urology

## 2017-06-10 ENCOUNTER — Other Ambulatory Visit: Payer: Self-pay

## 2017-06-10 ENCOUNTER — Ambulatory Visit (HOSPITAL_COMMUNITY)
Admission: RE | Admit: 2017-06-10 | Discharge: 2017-06-10 | Disposition: A | Discharge status: Home / Self Care | Payer: PPO | Source: Ambulatory Visit | Attending: Urology | Admitting: Urology

## 2017-06-10 DIAGNOSIS — I252 Old myocardial infarction: Secondary | ICD-10-CM | POA: Insufficient documentation

## 2017-06-10 DIAGNOSIS — K219 Gastro-esophageal reflux disease without esophagitis: Secondary | ICD-10-CM | POA: Insufficient documentation

## 2017-06-10 DIAGNOSIS — C7982 Secondary malignant neoplasm of genital organs: Secondary | ICD-10-CM | POA: Diagnosis not present

## 2017-06-10 DIAGNOSIS — I481 Persistent atrial fibrillation: Secondary | ICD-10-CM | POA: Diagnosis not present

## 2017-06-10 DIAGNOSIS — Z79899 Other long term (current) drug therapy: Secondary | ICD-10-CM | POA: Diagnosis not present

## 2017-06-10 DIAGNOSIS — I10 Essential (primary) hypertension: Secondary | ICD-10-CM | POA: Diagnosis not present

## 2017-06-10 DIAGNOSIS — I2510 Atherosclerotic heart disease of native coronary artery without angina pectoris: Secondary | ICD-10-CM | POA: Diagnosis not present

## 2017-06-10 DIAGNOSIS — C61 Malignant neoplasm of prostate: Secondary | ICD-10-CM

## 2017-06-10 DIAGNOSIS — I4891 Unspecified atrial fibrillation: Secondary | ICD-10-CM | POA: Diagnosis not present

## 2017-06-10 DIAGNOSIS — F329 Major depressive disorder, single episode, unspecified: Secondary | ICD-10-CM | POA: Insufficient documentation

## 2017-06-10 DIAGNOSIS — F419 Anxiety disorder, unspecified: Secondary | ICD-10-CM | POA: Insufficient documentation

## 2017-06-10 DIAGNOSIS — E785 Hyperlipidemia, unspecified: Secondary | ICD-10-CM | POA: Insufficient documentation

## 2017-06-10 DIAGNOSIS — C801 Malignant (primary) neoplasm, unspecified: Secondary | ICD-10-CM | POA: Diagnosis not present

## 2017-06-10 HISTORY — PX: ORCHIECTOMY: SHX2116

## 2017-06-10 SURGERY — ORCHIECTOMY
Anesthesia: Monitor Anesthesia Care | Site: Scrotum | Laterality: Bilateral

## 2017-06-10 MED ORDER — PROPOFOL 500 MG/50ML IV EMUL
INTRAVENOUS | Status: DC | PRN
  Administered 2017-06-10: 150 ug/kg/min via INTRAVENOUS

## 2017-06-10 MED ORDER — LIDOCAINE HCL (PF) 1 % IJ SOLN
INTRAMUSCULAR | Status: AC
Start: 2017-06-10 — End: 2017-06-10
  Filled 2017-06-10: qty 30

## 2017-06-10 MED ORDER — CEFAZOLIN SODIUM-DEXTROSE 2-4 GM/100ML-% IV SOLN
2.0000 g | INTRAVENOUS | Status: AC
  Administered 2017-06-10: 2 g via INTRAVENOUS
  Filled 2017-06-10: qty 100

## 2017-06-10 MED ORDER — PROPOFOL 10 MG/ML IV BOLUS
INTRAVENOUS | Status: AC
  Filled 2017-06-10: qty 20

## 2017-06-10 MED ORDER — OXYCODONE HCL 5 MG PO TABS: 5.0000 mg | ORAL_TABLET | ORAL | 0 refills | Status: DC | PRN

## 2017-06-10 MED ORDER — LIDOCAINE 2% (20 MG/ML) 5 ML SYRINGE
INTRAMUSCULAR | Status: DC | PRN
  Administered 2017-06-10: 60 mg via INTRAVENOUS

## 2017-06-10 MED ORDER — BUPIVACAINE HCL (PF) 0.25 % IJ SOLN
INTRAMUSCULAR | Status: DC | PRN
  Administered 2017-06-10: 15 mL

## 2017-06-10 MED ORDER — LACTATED RINGERS IV SOLN
INTRAVENOUS | Status: DC
  Administered 2017-06-10: 11:00:00 via INTRAVENOUS

## 2017-06-10 MED ORDER — BUPIVACAINE HCL (PF) 0.25 % IJ SOLN
INTRAMUSCULAR | Status: AC
  Filled 2017-06-10: qty 60

## 2017-06-10 MED ORDER — LIDOCAINE HCL (PF) 1 % IJ SOLN
INTRAMUSCULAR | Status: DC | PRN
  Administered 2017-06-10: 15 mL

## 2017-06-10 MED ORDER — 0.9 % SODIUM CHLORIDE (POUR BTL) OPTIME
TOPICAL | Status: DC | PRN
  Administered 2017-06-10: 1000 mL

## 2017-06-10 SURGICAL SUPPLY — 29 items
ADH SKN CLS APL DERMABOND .7 (GAUZE/BANDAGES/DRESSINGS) ×1
BLADE HEX COATED 2.75 (ELECTRODE) ×1 IMPLANT
BNDG GAUZE ELAST 4 BULKY (GAUZE/BANDAGES/DRESSINGS) ×3 IMPLANT
COVER SURGICAL LIGHT HANDLE (MISCELLANEOUS) ×3 IMPLANT
DERMABOND ADVANCED (GAUZE/BANDAGES/DRESSINGS) ×2
DERMABOND ADVANCED .7 DNX12 (GAUZE/BANDAGES/DRESSINGS) ×1 IMPLANT
DRAIN PENROSE 18X1/4 LTX STRL (WOUND CARE) ×3 IMPLANT
DRAPE LAPAROTOMY T 98X78 PEDS (DRAPES) ×3 IMPLANT
ELECT REM PT RETURN 15FT ADLT (MISCELLANEOUS) ×3 IMPLANT
GLOVE BIOGEL M 8.0 STRL (GLOVE) ×3 IMPLANT
GOWN STRL REUS W/ TWL XL LVL3 (GOWN DISPOSABLE) ×1 IMPLANT
GOWN STRL REUS W/TWL XL LVL3 (GOWN DISPOSABLE) ×6 IMPLANT
KIT BASIN OR (CUSTOM PROCEDURE TRAY) ×3 IMPLANT
NEEDLE HYPO 22GX1.5 SAFETY (NEEDLE) IMPLANT
NS IRRIG 1000ML POUR BTL (IV SOLUTION) ×3 IMPLANT
PACK GENERAL/GYN (CUSTOM PROCEDURE TRAY) ×3 IMPLANT
SUPPORT SCROTAL LG STRP (MISCELLANEOUS) ×2 IMPLANT
SUPPORTER ATHLETIC LG (MISCELLANEOUS) ×1
SUT CHROMIC 3 0 SH 27 (SUTURE) ×5 IMPLANT
SUT CHROMIC 4 0 SH 27 (SUTURE) IMPLANT
SUT MNCRL AB 4-0 PS2 18 (SUTURE) ×2 IMPLANT
SUT PROLENE 4 0 RB 1 (SUTURE) ×3
SUT PROLENE 4-0 RB1 .5 CRCL 36 (SUTURE) ×1 IMPLANT
SUT VIC AB 2-0 UR5 27 (SUTURE) IMPLANT
SUT VIC AB 4-0 PS2 27 (SUTURE) ×3 IMPLANT
SUT VICRYL 0 TIES 12 18 (SUTURE) ×3 IMPLANT
SYR CONTROL 10ML LL (SYRINGE) IMPLANT
TOWEL OR 17X26 10 PK STRL BLUE (TOWEL DISPOSABLE) ×3 IMPLANT
WATER STERILE IRR 1000ML POUR (IV SOLUTION) ×3 IMPLANT

## 2017-06-10 NOTE — Anesthesia Preprocedure Evaluation (Signed)
Anesthesia Evaluation  Patient identified by MRN, date of birth, ID band Patient awake    Reviewed: Allergy & Precautions, NPO status , Patient's Chart, lab work & pertinent test results  Airway Mallampati: II  TM Distance: >3 FB Neck ROM: Full    Dental no notable dental hx.    Pulmonary neg pulmonary ROS,    Pulmonary exam normal breath sounds clear to auscultation       Cardiovascular hypertension, Pt. on medications + CAD and + Past MI  Normal cardiovascular exam+ dysrhythmias Atrial Fibrillation  Rhythm:Irregular Rate:Normal     Neuro/Psych CVA negative neurological ROS  negative psych ROS   GI/Hepatic Neg liver ROS, hiatal hernia, GERD  ,  Endo/Other  negative endocrine ROS  Renal/GU negative Renal ROS  negative genitourinary   Musculoskeletal negative musculoskeletal ROS (+)   Abdominal   Peds negative pediatric ROS (+)  Hematology negative hematology ROS (+)   Anesthesia Other Findings   Reproductive/Obstetrics negative OB ROS                             Anesthesia Physical Anesthesia Plan  ASA: III  Anesthesia Plan: MAC   Post-op Pain Management:    Induction:   PONV Risk Score and Plan: 1 and Ondansetron  Airway Management Planned: Simple Face Mask  Additional Equipment:   Intra-op Plan:   Post-operative Plan:   Informed Consent: I have reviewed the patients History and Physical, chart, labs and discussed the procedure including the risks, benefits and alternatives for the proposed anesthesia with the patient or authorized representative who has indicated his/her understanding and acceptance.   Dental advisory given  Plan Discussed with: CRNA  Anesthesia Plan Comments:         Anesthesia Quick Evaluation

## 2017-06-10 NOTE — Anesthesia Postprocedure Evaluation (Signed)
Anesthesia Post Note  Patient: [name] Spence, MD  Procedure(s) Performed: BILATERAL ORCHIECTOMY (Bilateral Scrotum)     Patient location during evaluation: PACU Anesthesia Type: MAC Level of consciousness: awake and alert Pain management: pain level controlled Vital Signs Assessment: post-procedure vital signs reviewed and stable Respiratory status: spontaneous breathing, nonlabored ventilation, respiratory function stable and patient connected to nasal cannula oxygen Cardiovascular status: stable and blood pressure returned to baseline Postop Assessment: no apparent nausea or vomiting Anesthetic complications: no    Last Vitals:  Vitals:   06/10/17 1300 06/10/17 1315  BP: (!) 154/104 (!) 152/88  Pulse: (!) 58 (!) 55  Resp: 14 13  Temp:    SpO2: 99% 98%    Last Pain:  Vitals:   06/10/17 1315  TempSrc:   PainSc: 0-No pain                 Montez Hageman

## 2017-06-10 NOTE — Transfer of Care (Signed)
Immediate Anesthesia Transfer of Care Note  Patient: [name] Spence, MD  Procedure(s) Performed: BILATERAL ORCHIECTOMY (Bilateral Scrotum)  Patient Location: PACU  Anesthesia Type:MAC  Level of Consciousness: awake, alert , oriented and patient cooperative  Airway & Oxygen Therapy: Patient Spontanous Breathing and Patient connected to face mask oxygen  Post-op Assessment: Report given to RN, Post -op Vital signs reviewed and stable and Patient moving all extremities  Post vital signs: Reviewed and stable  Last Vitals:  Vitals Value Taken Time  BP    Temp    Pulse 56 06/10/2017 12:56 PM  Resp 14 06/10/2017 12:56 PM  SpO2 99 % 06/10/2017 12:56 PM  Vitals shown include unvalidated device data.  Last Pain:  Vitals:   06/10/17 1051  TempSrc:   PainSc: 0-No pain      Patients Stated Pain Goal: 3 (17/47/15 9539)  Complications: No apparent anesthesia complications

## 2017-06-10 NOTE — Discharge Instructions (Signed)
HOME CARE INSTRUCTIONS FOR SCROTAL PROCEDURES  Wound Care & Hygiene: You may apply an ice bag to the scrotum for the first 24 hours.  This may help decrease swelling and soreness.  You may have a dressing held in place by an athletic supporter.  You may remove the dressing in 24 hours and shower in 48 hours.  Continue to use the athletic supporter or tight briefs for at least a week. Activity: Rest today - not necessarily flat bed rest.  Just take it easy.  You should not do strenuous activities until your follow-up visit with your doctor.  You may resume light activity in 48 hours.  Return to Work:  Your doctor will advise you of this depending on the type of work you do  Diet: Drink liquids or eat a light diet this evening.  You may resume a regular diet tomorrow.  General Expectations: You may have a small amount of bleeding.  The scrotum may be swollen or bruised for about a week.  Call your Doctor if these occur:  -persistent or heavy bleeding  -temperature of 101 degrees or more  -severe pain, not relieved by your pain medication  Return to [employer]:  Call to set up and appointment.  Patient Signature:  __________________________________________________  Nurse's Signature:  __________________________________________________     General Anesthesia, Adult, Care After These instructions provide you with information about caring for yourself after your procedure. Your health care provider may also give you more specific instructions. Your treatment has been planned according to current medical practices, but problems sometimes occur. Call your health care provider if you have any problems or questions after your procedure. What can I expect after the procedure? After the procedure, it is common to have:  Vomiting.  A sore throat.  Mental slowness.  It is common to feel:  Nauseous.  Cold or shivery.  Sleepy.  Tired.  Sore or achy, even in parts of your  body where you did not have surgery.  Follow these instructions at home: For at least 24 hours after the procedure:  Do not: ? Participate in activities where you could fall or become injured. ? Drive. ? Use heavy machinery. ? Drink alcohol. ? Take sleeping pills or medicines that cause drowsiness. ? Make important decisions or sign legal documents. ? Take care of children on your own.  Rest. Eating and drinking  If you vomit, drink water, juice, or soup when you can drink without vomiting.  Drink enough fluid to keep your urine clear or pale yellow.  Make sure you have little or no nausea before eating solid foods.  Follow the diet recommended by your health care provider. General instructions  Have a responsible adult stay with you until you are awake and alert.  Return to your normal activities as told by your health care provider. Ask your health care provider what activities are safe for you.  Take over-the-counter and prescription medicines only as told by your health care provider.  If you smoke, do not smoke without supervision.  Keep all follow-up visits as told by your health care provider. This is important. Contact a health care provider if:  You continue to have nausea or vomiting at home, and medicines are not helpful.  You cannot drink fluids or start eating again.  You cannot urinate after 8-12 hours.  You develop a skin rash.  You have fever.  You have increasing redness at the site of your procedure. Get help right away  if:  You have difficulty breathing.  You have chest pain.  You have unexpected bleeding.  You feel that you are having a life-threatening or urgent problem. This information is not intended to replace advice given to you by your health care provider. Make sure you discuss any questions you have with your health care provider. Document Released: 05/03/2000 Document Revised: 06/30/2015 Document Reviewed: 01/09/2015 Elsevier  Interactive Patient Education  [employer].

## 2017-06-10 NOTE — Op Note (Signed)
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  Evaluation revealed a nodular prostate, PSA of 22, and biopsy revealed high-grade prostate cancer.  He does have nodal and bone metastases.  He presents at this time for castration.  I discussed the procedure with the patient as well as risks and complications.  These include but are not limited to anesthetic risk, skin infection, hematoma (the patient has recently been on Eliquis).  He understands these and desires to proceed.  Description of procedure: The patient was met in the holding area.  He was taken to the operating room where he was placed in the recumbent position on the operating table.  Monitored anesthesia care was administered.  His scrotum and perineum were prepped and draped.   proper timeout was performed.  I then performed a bilateral cord block, on each side with 10 cc of a 50/50 solution of quarter percent plain Marcaine and 1% plain lidocaine.  Additionally, another 10 cc was used to infiltrate the skin and the median raphae anterior to both testicles.  A 2 cm incision was then made in the median raphae in the upper midline and carried down to the right tunica vaginalis with electrocautery.  The right testicle was delivered from the wound.  The cord was skeletonized.  There was somewhat of a hematoma within the cord from the block.  The cord was then divided into 2 packets, each of which was clamped with a Kelly clamp.  The cord distal to this was divided.  Each cord segment was ligated with a 0 Vicryl tie x2.  Additionally, 1/5 tie was placed more proximally on the  cord.  There was adequate hemostasis on the stump of the cord and in the right hemiscrotum.  Similar procedure was performed on the left side.  Following this, both scrotal pouches were examined, there was adequate hemostasis.  The wound edges were carefully cauterized were small blood vessels were present.  The stump of each cord was then reinspected and no bleeding was seen.  At this point, the dartos fascia was reapproximated with a running 3-0 chromic.  Skin edges were reapproximated with a 4-0 Vicryl placed in a running subcuticular fashion.  Dermabond was placed.  Fluffs and a jockstrap were then placed as well.  The patient was then awakened and taken to the PACU in stable condition, having tolerated the procedure well. 

## 2017-06-10 NOTE — H&P (Signed)
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  He presented about a month ago with significant lower urinary tract symptomatology.  Exam revealed a significantly nodular prostate and his PSA was elevated @ 22.  Prostate biopsy was performed revealing high-grade prostate cancer, staging evaluation revealed metastatic disease.  He presents at this time for surgical castration, as there is a potential serious interaction between Lupron/Firmagon and his Tikosyn.  Past Medical History:  Diagnosis Date  . Allergic rhinitis   . Anxiety   . Asthma   . Atrial fibrillation ([place])   . CAD (coronary artery disease)   . Depression   . Diverticulosis 03/16/1995  . DJD (degenerative joint disease)    "most joints" (03/11/2015)  . GERD (gastroesophageal reflux disease) 12/30/2000  . Heart attack ([place]) 1984  . Hiatal hernia 12/30/2000  . History of blood transfusion 1988   "related to GI bleeding OR"  . History of duodenal ulcer 10/15/1986  . Hyperlipidemia   . Hypertension   . Sinus bradycardia   . Stroke [facility])     Past Surgical History:  Procedure Laterality Date  . CARDIAC CATHETERIZATION    . CATARACT EXTRACTION W/ INTRAOCULAR LENS  IMPLANT, BILATERAL Bilateral ?2013  . CHOLECYSTECTOMY OPEN  1989  . CORONARY ANGIOPLASTY WITH STENT PLACEMENT  2010  . hospital ccu  1984   heart attack, hypoplastic right coronary  . INGUINAL HERNIA REPAIR Right ~ 1990  . NASAL SEPTUM SURGERY  1972  . TONSILLECTOMY    . TOTAL KNEE ARTHROPLASTY Bilateral 1998-2004   "right-left"  . VAGOTOMY AND PYLOROPLASTY  1988   "bleeding duodenal ulcer"    Home Medications:  Allergies as of 06/10/2017      Reactions   Advil [ibuprofen] Nausea Only, Rash   Asa [aspirin] Rash   Tylenol [acetaminophen] Rash      Medication List    Notice   Cannot display discharge medications because the patient has not yet been  admitted.     Allergies:  Allergies  Allergen Reactions  . Advil [Ibuprofen] Nausea Only and Rash  . Asa [Aspirin] Rash  . Tylenol [Acetaminophen] Rash    Family History  Problem Relation Age of Onset  . Coronary artery disease Mother        deceased  . Deep vein thrombosis Sister   . Coronary artery disease Brother   . Asthma Brother   . Colon cancer Neg Hx     Social History:  reports that he has never smoked. He has never used smokeless tobacco. He reports that he does not drink alcohol or use drugs.  ROS: A complete review of systems was performed.  All systems are negative except for pertinent findings as noted.Significant lower urinary tract  symptoms  Physical Exam:  Vital signs in last 24 hours:   Constitutional:  Alert and oriented, No acute distress Cardiovascular: Regular rate and rhythm, No JVD Respiratory: Normal respiratory effort, Lungs clear bilaterally GI: Abdomen is soft, nontender, nondistended, no abdominal masses Genitourinary: No CVAT. Normal male phallus, testes are descended bilaterally and non-tender and without masses, scrotum is normal in appearance without lesions or masses, perineum is normal on inspection. Rectal: Normal sphincter tone, no rectal masses, prostate is nodular. Prostate size is estimated to be 60 cc Lymphatic: No lymphadenopathy Neurologic: Grossly intact, no focal deficits Psychiatric: Normal mood and affect  Laboratory Data:  Recent Labs    06/08/17 0937  WBC 8.4  HGB 13.5  HCT 41.8  PLT 221    Recent Labs    06/08/17 0937  NA 139  K 5.5*  CL 105  GLUCOSE 135*  BUN 26*  CALCIUM 9.3  CREATININE 1.16     No results found for this or any previous visit (from the past 24 hour(s)). No results found for this or any previous visit (from the past 240 hour(s)).  Renal Function: Recent Labs    06/08/17 0937  CREATININE 1.16   Estimated Creatinine Clearance: 45.5 mL/min (by C-G formula based on SCr of 1.16  mg/dL).  Radiologic Imaging: No results found.  Impression/Assessment:  Metastatic prostate cancer  Plan:  Bilateral orchiectomy

## 2017-06-10 NOTE — Interval H&P Note (Signed)
History and Physical Interval Note:  06/10/2017 11:55 AM  [name] Spence, MD  has presented today for surgery, with the diagnosis of METASTATIC  PROSTATE CANCER  The various methods of treatment have been discussed with the patient and family. After consideration of risks, benefits and other options for treatment, the patient has consented to  Procedure(s) with comments: ORCHIECTOMY (Bilateral) - MAC ANESTHESIA AND LOCAL as a surgical intervention .  The patient's history has been reviewed, patient examined, no change in status, stable for surgery.  I have reviewed the patient's chart and labs.  Questions were answered to the patient's satisfaction.     [name]

## 2017-06-11 ENCOUNTER — Encounter (HOSPITAL_COMMUNITY): Payer: Self-pay | Admitting: Urology

## 2017-06-15 ENCOUNTER — Other Ambulatory Visit: Payer: Self-pay | Admitting: Internal Medicine

## 2017-06-24 DIAGNOSIS — C61 Malignant neoplasm of prostate: Secondary | ICD-10-CM | POA: Diagnosis not present

## 2017-07-08 DIAGNOSIS — R351 Nocturia: Secondary | ICD-10-CM | POA: Diagnosis not present

## 2017-07-08 DIAGNOSIS — N401 Enlarged prostate with lower urinary tract symptoms: Secondary | ICD-10-CM | POA: Diagnosis not present

## 2017-07-08 DIAGNOSIS — C61 Malignant neoplasm of prostate: Secondary | ICD-10-CM | POA: Diagnosis not present

## 2017-07-15 ENCOUNTER — Other Ambulatory Visit: Payer: Self-pay | Admitting: Sports Medicine

## 2017-07-15 ENCOUNTER — Other Ambulatory Visit: Payer: Self-pay | Admitting: Family Medicine

## 2017-07-18 NOTE — Telephone Encounter (Signed)
Okay for refill?  

## 2017-07-18 NOTE — Telephone Encounter (Signed)
This is Dr Sherren Mocha PT last OV was 01/04/2017 and last refill was done 01/04/2017 for 90 tablets with 4 refill, please Advise if ok to refill.

## 2017-08-03 ENCOUNTER — Other Ambulatory Visit (HOSPITAL_COMMUNITY): Payer: Self-pay | Admitting: Internal Medicine

## 2017-08-14 ENCOUNTER — Other Ambulatory Visit (HOSPITAL_COMMUNITY): Payer: Self-pay | Admitting: Nurse Practitioner

## 2017-09-07 ENCOUNTER — Other Ambulatory Visit (HOSPITAL_COMMUNITY): Payer: Self-pay | Admitting: Nurse Practitioner

## 2017-09-08 ENCOUNTER — Other Ambulatory Visit: Payer: Self-pay | Admitting: Sports Medicine

## 2017-09-12 NOTE — Progress Notes (Signed)
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 He had about 5 weeks of relief from previous injection. Pain with gripping objects and turning his wrist. Pain located at [facility] joint.  Has been seen previously and given injections.  Last one was 5 months ago.  Did work for quite some time.  Patient has not been seen since though secondary to recent history of prostate cancer now status post surgical intervention but patient does have bony mets noted.  Patient states that now his PSA has decreased from 22 down to 3.       Past Medical History:  Diagnosis Date  . Allergic rhinitis   . Anxiety   . Asthma   . Atrial fibrillation ([place])   . CAD (coronary artery disease)   . Depression   . Diverticulosis 03/16/1995  . DJD (degenerative joint disease)    "most joints" (03/11/2015)  . GERD (gastroesophageal reflux disease) 12/30/2000  . Heart attack ([place]) 1984  . Hiatal hernia 12/30/2000  . History of blood transfusion 1988   "related to GI bleeding OR"  . History of duodenal ulcer 10/15/1986  . Hyperlipidemia   . Hypertension   . Sinus bradycardia   . Stroke [facility])    Past Surgical History:  Procedure Laterality Date  . CARDIAC CATHETERIZATION    . CATARACT EXTRACTION W/ INTRAOCULAR LENS  IMPLANT, BILATERAL Bilateral ?2013  . CHOLECYSTECTOMY OPEN  1989  . CORONARY ANGIOPLASTY WITH STENT PLACEMENT  2010  . hospital ccu  1984   heart attack, hypoplastic right coronary  . INGUINAL HERNIA REPAIR Right ~ 1990  . NASAL SEPTUM SURGERY  1972  . ORCHIECTOMY Bilateral 06/10/2017   Procedure: BILATERAL ORCHIECTOMY;  Surgeon: Franchot Gallo, MD;  Location: [facility];  Service: Urology;  Laterality: Bilateral;  MAC ANESTHESIA AND LOCAL  . TONSILLECTOMY    . TOTAL KNEE ARTHROPLASTY Bilateral 1998-2004   "right-left"  . VAGOTOMY AND PYLOROPLASTY  1988   "bleeding duodenal ulcer"   Social History   Socioeconomic History  . Marital status: Married    Spouse name: Not on file  . Number of children: 2  . Years of education: Not on file  . Highest education level: Not on file  Occupational History  . Occupation: IT sales professional: RETIRED  Social Needs  . Financial resource strain: Not on file  . Food insecurity:    Worry: Not on file    Inability: Not on file  . Transportation needs:    Medical: Not on file    Non-medical: Not on file  Tobacco Use  . Smoking status: Never Smoker  . Smokeless tobacco: Never Used  Substance and Sexual Activity  . Alcohol use: No  . Drug use: No  . Sexual activity: Not Currently  Lifestyle  . Physical activity:    Days per week: Not on file    Minutes per session: Not on file  . Stress: Not on file  Relationships  . Social connections:    Talks on phone: Not on file    Gets together: Not on file    Attends religious service: Not on file    Active member of club or organization: Not on file    Attends meetings of clubs or organizations: Not on file  Relationship status: Not on file  Other Topics Concern  . Not on file  Social History Narrative   Married. Regular exercise - yes.    Allergies  Allergen Reactions  . Advil [Ibuprofen] Nausea Only and Rash  . Asa [Aspirin] Rash  . Tylenol [Acetaminophen] Rash   Family History  Problem Relation Age of Onset  . Coronary artery disease Mother        deceased  . Deep vein thrombosis Sister   . Coronary artery disease Brother   . Asthma Brother   . Colon cancer Neg Hx      Past medical history, social, surgical and family history all reviewed in electronic medical record.  No pertanent information unless stated regarding to the chief complaint.   Review of Systems:Review of systems updated and as accurate as of 09/14/17  No headache, visual changes, nausea, vomiting, diarrhea,  constipation, dizziness, abdominal pain, skin rash, fevers, chills, night sweats, weight loss, swollen lymph nodes, body aches, joint swelling, chest pain, shortness of breath, mood changes.  Positive muscle aches  Objective  Blood pressure 108/62, pulse 70, height _0  (1.778 m), weight 152 lb (68.9 kg), SpO2 97 %. Systems examined below as of 09/14/17   General: No apparent distress alert and oriented x3 mood and affect normal, dressed appropriately.  Mild cachectic HEENT: Pupils equal, extraocular movements intact  Respiratory: Patient's speak in full sentences and does not appear short of breath  Cardiovascular: No lower extremity edema, non tender, no erythema  Skin: Warm dry intact with no signs of infection or rash on extremities or on axial skeleton.  Abdomen: Soft nontender  Neuro: Cranial nerves II through XII are intact, neurovascularly intact in all extremities with 2+ DTRs and 2+ pulses.  Lymph: No lymphadenopathy of posterior or anterior cervical chain or axillae bilaterally.  Gait severe antalgic.  MSK:  tender with full range of motion and good stability and symmetric strength and tone of shoulders, elbows, hip, knee and ankles bilaterally.  Moderate to severe arthritic changes of multiple joints   Patient's right hand shows significant arthritic changes.  Positive grind of the [place] joint.  Mild swelling noted over it.  Some senile bruising in the area  Patient's right ankle also has severe chronic subluxation with severe pronation of the foot and subluxation of the talonavicular joint medially as well as the talo cuboid laterally.  Limited range of motion in all planes Procedure: Real-time Ultrasound Guided Injection of right CMC joint Device: GE Logiq Q7 Ultrasound guided injection is preferred based studies that show increased duration, increased effect, greater accuracy, decreased procedural pain, increased response rate, and decreased cost with ultrasound guided versus  blind injection.  Verbal informed consent obtained.  Time-out conducted.  Noted no overlying erythema, induration, or other signs of local infection.  Skin prepped in a sterile fashion.  Local anesthesia: Topical Ethyl chloride.  With sterile technique and under real time ultrasound guidance: With a 25-gauge half inch needle patient was injected with 0.5 cc of 0.5% Marcaine and 0.5 cc of Kenalog 40 mg/mL into the right CMC joint Completed without difficulty  Pain immediately resolved suggesting accurate placement of the medication.  Advised to call if fevers/chills, erythema, induration, drainage, or persistent bleeding.  Images permanently stored and available for review in the ultrasound unit.  Impression: Technically successful ultrasound guided injection.   Impression and Recommendations:     This case required medical decision making of moderate complexity.      Note: This  dictation was prepared with Dragon dictation along with smaller phrase technology. Any transcriptional errors that result from this process are unintentional.

## 2017-09-14 ENCOUNTER — Encounter: Payer: Self-pay | Admitting: Family Medicine

## 2017-09-14 ENCOUNTER — Ambulatory Visit: Payer: Self-pay

## 2017-09-14 ENCOUNTER — Ambulatory Visit (INDEPENDENT_AMBULATORY_CARE_PROVIDER_SITE_OTHER): Payer: PPO | Admitting: Family Medicine

## 2017-09-14 VITALS — BP 108/62 | HR 70 | Ht 70.0 in | Wt 152.0 lb

## 2017-09-14 DIAGNOSIS — M25531 Pain in right wrist: Secondary | ICD-10-CM

## 2017-09-14 DIAGNOSIS — M79671 Pain in right foot: Secondary | ICD-10-CM

## 2017-09-14 DIAGNOSIS — M1811 Unilateral primary osteoarthritis of first carpometacarpal joint, right hand: Secondary | ICD-10-CM | POA: Diagnosis not present

## 2017-09-14 NOTE — Assessment & Plan Note (Signed)
Patient given injection.  Tolerated procedure well.  Discussed icing regimen and home exercise.  Discussed which activities to do which wants to avoid.  Increase activity as tolerated.  Follow-up again in 4 to 12 weeks patient would not want any surgical intervention.

## 2017-09-14 NOTE — Patient Instructions (Signed)
4 weeks

## 2017-09-14 NOTE — Assessment & Plan Note (Signed)
Severe degenerative changes of the ankle with severe subluxation.  We discussed the potential for bracing and will look into custom bracing but likely no significant improvement other than replacement.  We will discuss with patient in follow-up

## 2017-09-15 ENCOUNTER — Telehealth (HOSPITAL_COMMUNITY): Payer: Self-pay | Admitting: Cardiology

## 2017-09-15 NOTE — Telephone Encounter (Signed)
Called and left message for patient to call back.  Need to get patient f/u appt with Dr. Aundra Dubin on 09/19/17 at either 9:30 or 11:30 am per Kevan Rosebush, RN.

## 2017-09-16 NOTE — Telephone Encounter (Signed)
Spoke with patient, he is aware of appt 09/19/17 @9 :30 am with Dr. Aundra Dubin.

## 2017-09-19 ENCOUNTER — Ambulatory Visit (HOSPITAL_COMMUNITY)
Admission: RE | Admit: 2017-09-19 | Discharge: 2017-09-19 | Disposition: A | Discharge status: Home / Self Care | Payer: PPO | Source: Ambulatory Visit | Attending: Cardiology | Admitting: Cardiology

## 2017-09-19 VITALS — BP 100/66 | HR 72 | Wt 156.8 lb

## 2017-09-19 DIAGNOSIS — N189 Chronic kidney disease, unspecified: Secondary | ICD-10-CM | POA: Insufficient documentation

## 2017-09-19 DIAGNOSIS — Z8546 Personal history of malignant neoplasm of prostate: Secondary | ICD-10-CM | POA: Diagnosis not present

## 2017-09-19 DIAGNOSIS — R001 Bradycardia, unspecified: Secondary | ICD-10-CM | POA: Insufficient documentation

## 2017-09-19 DIAGNOSIS — I480 Paroxysmal atrial fibrillation: Secondary | ICD-10-CM | POA: Insufficient documentation

## 2017-09-19 DIAGNOSIS — Z79899 Other long term (current) drug therapy: Secondary | ICD-10-CM | POA: Diagnosis not present

## 2017-09-19 DIAGNOSIS — I10 Essential (primary) hypertension: Secondary | ICD-10-CM | POA: Diagnosis not present

## 2017-09-19 DIAGNOSIS — E785 Hyperlipidemia, unspecified: Secondary | ICD-10-CM | POA: Insufficient documentation

## 2017-09-19 DIAGNOSIS — I493 Ventricular premature depolarization: Secondary | ICD-10-CM | POA: Insufficient documentation

## 2017-09-19 DIAGNOSIS — I5032 Chronic diastolic (congestive) heart failure: Secondary | ICD-10-CM | POA: Diagnosis not present

## 2017-09-19 DIAGNOSIS — Z7901 Long term (current) use of anticoagulants: Secondary | ICD-10-CM | POA: Diagnosis not present

## 2017-09-19 DIAGNOSIS — I2510 Atherosclerotic heart disease of native coronary artery without angina pectoris: Secondary | ICD-10-CM | POA: Diagnosis not present

## 2017-09-19 DIAGNOSIS — Z8673 Personal history of transient ischemic attack (TIA), and cerebral infarction without residual deficits: Secondary | ICD-10-CM | POA: Insufficient documentation

## 2017-09-19 DIAGNOSIS — K219 Gastro-esophageal reflux disease without esophagitis: Secondary | ICD-10-CM | POA: Insufficient documentation

## 2017-09-19 DIAGNOSIS — J45909 Unspecified asthma, uncomplicated: Secondary | ICD-10-CM | POA: Diagnosis not present

## 2017-09-19 DIAGNOSIS — I130 Hypertensive heart and chronic kidney disease with heart failure and stage 1 through stage 4 chronic kidney disease, or unspecified chronic kidney disease: Secondary | ICD-10-CM | POA: Insufficient documentation

## 2017-09-19 DIAGNOSIS — Z7951 Long term (current) use of inhaled steroids: Secondary | ICD-10-CM | POA: Insufficient documentation

## 2017-09-19 LAB — MAGNESIUM: MAGNESIUM: 2.1 mg/dL (ref 1.7–2.4)

## 2017-09-19 LAB — LIPID PANEL
Cholesterol: 94 mg/dL (ref 0–200)
HDL: 38 mg/dL — AB (ref >40)
LDL CALC: 43 mg/dL (ref 0–99)
TRIGLYCERIDES: 65 mg/dL (ref <150)
Total CHOL/HDL Ratio: 2.5 RATIO
VLDL: 13 mg/dL (ref 0–40)

## 2017-09-19 LAB — BASIC METABOLIC PANEL
ANION GAP: 6 (ref 5–15)
BUN: 24 mg/dL — ABNORMAL HIGH (ref 8–23)
CALCIUM: 9.2 mg/dL (ref 8.9–10.3)
CO2: 29 mmol/L (ref 22–32)
Chloride: 104 mmol/L (ref 98–111)
Creatinine, Ser: 1.23 mg/dL (ref 0.61–1.24)
GFR calc Af Amer: 59 mL/min — ABNORMAL LOW (ref >60)
GFR, EST NON AFRICAN AMERICAN: 51 mL/min — AB (ref >60)
GLUCOSE: 107 mg/dL — AB (ref 70–99)
POTASSIUM: 4.7 mmol/L (ref 3.5–5.1)
SODIUM: 139 mmol/L (ref 135–145)

## 2017-09-19 LAB — CBC
HEMATOCRIT: 40.4 % (ref 39.0–52.0)
HEMOGLOBIN: 12.7 g/dL — AB (ref 13.0–17.0)
MCH: 30 pg (ref 26.0–34.0)
MCHC: 31.4 g/dL (ref 30.0–36.0)
MCV: 95.5 fL (ref 78.0–100.0)
Platelets: 209 10*3/uL (ref 150–400)
RBC: 4.23 MIL/uL (ref 4.22–5.81)
RDW: 13.2 % (ref 11.5–15.5)
WBC: 8.1 10*3/uL (ref 4.0–10.5)

## 2017-09-19 MED ORDER — TRAMADOL HCL 50 MG PO TABS: 50.0000 mg | ORAL_TABLET | Freq: Two times a day (BID) | ORAL | 0 refills | Status: DC | PRN

## 2017-09-19 MED ORDER — ATENOLOL 25 MG PO TABS: 25.0000 mg | ORAL_TABLET | Freq: Every day | ORAL | 3 refills | Status: DC

## 2017-09-19 NOTE — Patient Instructions (Signed)
DECREASE Atorvastatin to 25 mg tablet ONCE daily.  Tramadol refilled.  EKG today.  Routine lab work today. Will notify you of abnormal results, otherwise no news is good news!  Will schedule you for an echocardiogram at [facility] Address: [address] ([place]), [place], [place] [address]  Phone: [phone] Their office will call you directly to schedule and give additional instructions for appointment. We will call you by phone with the results, typically one week after the exam.  Follow up 6 months with Dr. Aundra Dubin. We will call you closer to this time, or you may call our office to schedule 1 month before you are due to be seen. Take all medication as prescribed the day of your appointment. Bring all medications with you to your appointment.  Do the following things EVERYDAY: 1) Weigh yourself in the morning before breakfast. Write it down and keep it in a log. 2) Take your medicines as prescribed 3) Eat low salt foods-Limit salt (sodium) to 2000 mg per day.  4) Stay as active as you can everyday 5) Limit all fluids for the day to less than 2 liters

## 2017-09-19 NOTE — Progress Notes (Signed)
Patient ID: [name] Spence, MD, male   DOB: 1929-04-28, 84 y.o.   MRN: [id number] PCP: Dr. Sherren Mocha Cardiology: Dr. 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  He had an RV infarct with chronic total occlusion of a small nondominant RCA in the 1980s.  He had a drug-eluting stent to the CFX in 11/10.    He is very symptomatic with atrial fibrillation episodes. He has had a chronic pattern of mild shortness of breath and mild left shoulder pain/chest aching when walking up a hill (x 20 years).   He can walk 3 miles on flat ground without problems.  He golfs 3-4 times a week.  He walks for 20-30 minutes on a treadmill several times a week.   Echo in 7/16 showed normal LV systolic function, EF 01-09% with moderate diastolic dysfunction.  Moderate TR with PA systolic pressure 65 mmHg.  Normal RV size and systolic function.  Given volume overload on exam and abnormal echo, he was started on low dose Lasix in the past.    He was started on Multaq but continued to have episodes of atrial fibrillation.  This was stopped, and he was admitted for Tikosyn loading.  After he went home, he developed left facial droop and left arm weakness.  He was found to have a small right parietal cortical infarct with hemorrhagic conversion.  His symptoms resolved quickly. INR was subtherapeutic at admission.  Warfarin was stopped and he was put on apixaban.   In 9/17, he was admitted with LLL PNA.  During the admission, he had a couple of relatively short runs of atrial fibrillation.  He developed more frequent episodes of atrial fibrillation at home and was started on Tikosyn.   In 5/19, he had bilateral orchiectomy for metastatic prostate cancer.  His PSA has come down considerably.   He has been doing reasonably well since his surgery.  Weight is down 1 lb.  He has chronic dyspnea walking up a hill but is still able to  play 18 holes of golf.  No chest pain.  Occasional lightheadedness with standing.  BP is soft today.  He feels like he has lost exercise tolerance over the last few months, less stamina.  Of note, he stopped almost all his exercise around the time of his 5/19 surgery and is just now trying to restart.   ECG (personally reviewed): NSR, PVC, QTc 443   Labs (6/11): LDL 63, HDL 35, K 5.5, creatinine 1.1 Labs (6/12): LDL 58, HDL 53, TSH normal, K 4.7, creatinine 1 Labs (12/12): BNP 79 Labs (7/14): K 5, creatinine 1.1, LDL 48, HDL 34 Labs (8/15): LDL 48, HDL 33, TSH normal, K 5.4, creatinine 1.2 Labs (6/16): K 5.1, creatinine 1.27, HCT 42.5 Labs (10/16): LDL 37, HDL 32 Labs (11/16): K 4.4, creatinine 1.36, BNP 103 Labs  (2/17): K 4.5, creatinine 1.38, Mg 2.0, LDL 32 Labs (3/17): HCT 41.7 Labs (5/17): K 4.2, creatinine 1.17, Mg 2.1 Labs (9/17): K 4.2, creatinine 1.16, HCT 37.3 Labs (11/17): hgb 13.3, LDL 40 Labs (5/18): K 4.5, creatinine 1.25 Labs (5/19): K 5.5, creatinine 1.16  PMH: 1. CAD: RV infarct in the 1980s.  Chronic total occlusion of a small nondominant RCA.  11/10 PCI to CFX with Xience DES.  EF 60% on LV-gram at that time. ETT (11/14) with 9'07" exercise, no significant ST depression.  2.  Paroxysmal atrial fibrillation:  Multaq was ineffective. Now on Tikosyn.  3.  Hyperlipidemia 4. GERD 5. HTN 6. Asthma 7. Chronic diastolic CHF: Echo (5/00): EF 55-60%, mild LV hypertrophy, no regional WMAs, mild AI, mild MR, mild-moderate biatrial enlargement, PA systolic pressure 51 mmHg. Echo (7/16) with EF 60-65%, moderate diastolic dysfunction, normal RV size and systolic function, mild-moderate MR, moderate TR, moderate biatrial enlargement, PA systolic pressure 65 mmHg.  - Echo (5/18): EF 55-60%, mild AI, mild MR, PASP 60 mmHg, normal RV size and systolic function.  8. CKD 9. CVA: Right parietal cortical CVA with peri-infarct hemorrhagic conversion.  CTA showed right M2 branch flow gap.    10. Prostate cancer: Metastatic.  - Bilateral orchiectomy in 5/19.   SH: Nonsmoker.  Retired Stage manager, lives in [place].  2 daughters.  Originally from [place].   FH: Brother with CABG.   ROS: All systems reviewed and negative except as per HPI.    Current Outpatient Medications  Medication Sig Dispense Refill  . apixaban (ELIQUIS) 5 MG TABS tablet Take 1 tablet (5 mg total) by mouth 2 (two) times daily. Needs OV for further refills [phone] 60 tablet 1  . ARNUITY ELLIPTA 200 MCG/ACT AEPB INHALE 1 PUFF INTO THE LUNGS DAILY 30 each 5  . atenolol (TENORMIN) 25 MG tablet Take 1 tablet (25 mg total) by mouth daily. TAKE 1 TABLET(25 MG) BY MOUTH TWICE DAILY 90 tablet 3  . atorvastatin (LIPITOR) 20 MG tablet TAKE 1 TABLET(20 MG) BY MOUTH DAILY 90 tablet 4  . buPROPion (WELLBUTRIN SR) 150 MG 12 hr tablet Take 1 tablet (150 mg total) by mouth daily. 100 tablet 4  . cetirizine (ZYRTEC) 10 MG tablet Take 10 mg by mouth daily with supper.     . Cholecalciferol (VITAMIN D) 2000 units tablet Take 2,000 Units by mouth daily.    Marland Kitchen diltiazem (CARDIZEM) 30 MG tablet Take 1 tablet every 4 hours AS NEEDED for afib heart rate >100 (Patient taking differently: Take 30 mg by mouth every 4 (four) hours as needed (for AFIB heart rate > 100). ) 45 tablet 2  . dofetilide (TIKOSYN) 250 MCG capsule TAKE 1 CAPSULE(250 MCG) BY MOUTH TWICE DAILY 180 capsule 0  . levalbuterol (XOPENEX HFA) 45 MCG/ACT inhaler Inhale 1 puff into the lungs every 4 (four) hours as needed for wheezing or shortness of breath.    Marland Kitchen LORazepam (ATIVAN) 1 MG tablet TAKE 1/2 TO 1 TABLET BY MOUTH EVERY NIGHT AT BEDTIME AS NEEDED 90 tablet 3  . mometasone (NASONEX) 50 MCG/ACT nasal spray Place 1 spray into the nose daily as needed (for allergies).     . nitroGLYCERIN (NITROSTAT) 0.4 MG SL tablet Place 1 tablet (0.4 mg total) under the tongue every 5 (five) minutes x 3 doses as needed for chest pain. 25 tablet 2  . omeprazole (PRILOSEC  OTC) 20 MG tablet Take 1 tablet (20 mg total) by mouth daily. 100 tablet 4  . oxyCODONE (ROXICODONE) 5 MG immediate release tablet Take 1 tablet (5 mg total) by mouth every 4 (four) hours as needed for severe pain. 10 tablet 0  . tamsulosin (FLOMAX) 0.4 MG CAPS capsule Take 1 capsule (0.4 mg total) by mouth daily. 90 capsule 4  . traMADol (ULTRAM) 50 MG tablet Take 1 tablet (50 mg total) by mouth every 12 (twelve) hours as needed. 60 tablet 0   No current facility-administered medications for this encounter.     BP 100/66   Pulse 72   Wt 71.1 kg (156 lb  12.8 oz)   SpO2 95%   BMI 22.50 kg/m  General: NAD Neck: No JVD, no thyromegaly or thyroid nodule.  Lungs: Clear to auscultation bilaterally with normal respiratory effort. CV: Nondisplaced PMI.  Heart regular S1/S2, no S3/S4, 1/6 SEM RUSB.  No peripheral edema.  No carotid bruit.  Normal pedal pulses.  Abdomen: Soft, nontender, no hepatosplenomegaly, no distention.  Skin: Intact without lesions or rashes.  Neurologic: Alert and oriented x 3.  Psych: Normal affect. Extremities: No clubbing or cyanosis.  HEENT: Normal.   Assessment/Plan: 1. 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  This has really not changed.  He does feel like his exercise tolerance/stamina has worsened over the last couple of months, but he had surgery in 5/19 and really stopped exercising for a couple of months.   - Restart exercise regimen gradually.  If fatigue/dyspnea worsens, he will let me know and we can arrange for ETT-Cardiolite.  - He is on apixaban without ASA given stable CAD.   - Continue statin, beta blocker.  2. Hyperlipidemia: Check lipids today.  3. Chronic diastolic CHF: Echo in 6/81 was stable compared to prior studies, suggesting diastolic dysfunction.  EF 60-65% with grade II diastolic dysfunction and PA systolic pressure 60 mmHg.  RV appeared normal. He looks euvolemic today and has not been taking Lasix.   -  I will obtain a repeat echo given decreased stamina (think this is due to deconditioning).  4. Atrial fibrillation: Paroxysmal.  Episodes are quite symptomatic, he feels "bad" with atrial fibrillation even when he's rate-controlled. He had frequent breakthrough on Multaq but seems to be doing much better on Tikosyn, no recent breakthrough symptoms. - Continue Tikosyn.  BMET/Mg today.  QTc ok on today's ECG.  - If has breakthrough afib on Tikosyn, amiodarone would be an option.  - Now on apixaban with CVA on warfarin (was subtherapeutic).  CBC today.  5. HTN: BP running on the lower side.   - Decrease atenolol to 25 mg daily.   Followup in 6 months.     Loralie Champagne 09/19/2017

## 2017-09-20 ENCOUNTER — Encounter (HOSPITAL_COMMUNITY): Payer: Self-pay | Admitting: *Deleted

## 2017-09-22 ENCOUNTER — Ambulatory Visit (HOSPITAL_COMMUNITY): Payer: PPO | Attending: Cardiology

## 2017-09-22 ENCOUNTER — Other Ambulatory Visit: Payer: Self-pay

## 2017-09-22 DIAGNOSIS — I110 Hypertensive heart disease with heart failure: Secondary | ICD-10-CM | POA: Insufficient documentation

## 2017-09-22 DIAGNOSIS — Z8673 Personal history of transient ischemic attack (TIA), and cerebral infarction without residual deficits: Secondary | ICD-10-CM | POA: Insufficient documentation

## 2017-09-22 DIAGNOSIS — J45909 Unspecified asthma, uncomplicated: Secondary | ICD-10-CM | POA: Diagnosis not present

## 2017-09-22 DIAGNOSIS — I071 Rheumatic tricuspid insufficiency: Secondary | ICD-10-CM | POA: Insufficient documentation

## 2017-09-22 DIAGNOSIS — I5032 Chronic diastolic (congestive) heart failure: Secondary | ICD-10-CM | POA: Insufficient documentation

## 2017-09-22 DIAGNOSIS — E785 Hyperlipidemia, unspecified: Secondary | ICD-10-CM | POA: Diagnosis not present

## 2017-09-22 DIAGNOSIS — I2510 Atherosclerotic heart disease of native coronary artery without angina pectoris: Secondary | ICD-10-CM | POA: Insufficient documentation

## 2017-09-26 ENCOUNTER — Other Ambulatory Visit (HOSPITAL_COMMUNITY): Payer: Self-pay | Admitting: Pharmacist

## 2017-09-26 DIAGNOSIS — I10 Essential (primary) hypertension: Secondary | ICD-10-CM

## 2017-09-26 MED ORDER — ATENOLOL 25 MG PO TABS: 25.0000 mg | ORAL_TABLET | Freq: Every day | ORAL | 3 refills | Status: DC

## 2017-09-26 NOTE — Progress Notes (Signed)
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 States the ankle is about the same. Wants orthotics.  Patient has had difficulty with this ankle previously.  Clinically, patient did have significant subluxation of the talonavicular joint causing impingement on the lateral aspect near the lateral malleolus.  Patient has been in custom orthotics for some time and we have discussed with him about possibly new ones.  Patient states that this is 1 of the only things that seems to be limiting him from activity.  She is being treated for metastatic prostate cancer and is looking to stay active and finds it difficult with the ankle at the moment.  Does not want any surgical intervention    Past Medical History:  Diagnosis Date  . Allergic rhinitis   . Anxiety   . Asthma   . Atrial fibrillation ([place])   . CAD (coronary artery disease)   . Depression   . Diverticulosis 03/16/1995  . DJD (degenerative joint disease)    "most joints" (03/11/2015)  . GERD (gastroesophageal reflux disease) 12/30/2000  . Heart attack ([place]) 1984  . Hiatal hernia 12/30/2000  . History of blood transfusion 1988   "related to GI bleeding OR"  . History of duodenal ulcer 10/15/1986  . Hyperlipidemia   . Hypertension   . Sinus bradycardia   . Stroke [facility])    Past Surgical History:  Procedure Laterality Date  . CARDIAC CATHETERIZATION    . CATARACT EXTRACTION W/ INTRAOCULAR LENS  IMPLANT, BILATERAL Bilateral ?2013  . CHOLECYSTECTOMY OPEN  1989  . CORONARY ANGIOPLASTY WITH STENT PLACEMENT  2010  . hospital ccu  1984   heart attack, hypoplastic right coronary  . INGUINAL HERNIA REPAIR Right ~ 1990  . NASAL SEPTUM SURGERY  1972  . ORCHIECTOMY Bilateral 06/10/2017   Procedure: BILATERAL ORCHIECTOMY;  Surgeon: Franchot Gallo, MD;   Location: [facility];  Service: Urology;  Laterality: Bilateral;  MAC ANESTHESIA AND LOCAL  . TONSILLECTOMY    . TOTAL KNEE ARTHROPLASTY Bilateral 1998-2004   "right-left"  . VAGOTOMY AND PYLOROPLASTY  1988   "bleeding duodenal ulcer"   Social History   Socioeconomic History  . Marital status: Married    Spouse name: Not on file  . Number of children: 2  . Years of education: Not on file  . Highest education level: Not on file  Occupational History  . Occupation: IT sales professional: RETIRED  Social Needs  . Financial resource strain: Not on file  . Food insecurity:    Worry: Not on file    Inability: Not on file  . Transportation needs:    Medical: Not on file    Non-medical: Not on file  Tobacco Use  . Smoking status: Never Smoker  . Smokeless tobacco: Never Used  Substance and Sexual Activity  . Alcohol use: No  . Drug use: No  . Sexual activity: Not Currently  Lifestyle  . Physical activity:    Days per week: Not on file    Minutes per session: Not on file  . Stress: Not on file  Relationships  . Social connections:    Talks on phone: Not on file    Gets together: Not on file    Attends religious service: Not on file    Active member  of club or organization: Not on file    Attends meetings of clubs or organizations: Not on file    Relationship status: Not on file  Other Topics Concern  . Not on file  Social History Narrative   Married. Regular exercise - yes.    Allergies  Allergen Reactions  . Advil [Ibuprofen] Nausea Only and Rash  . Asa [Aspirin] Rash  . Tylenol [Acetaminophen] Rash   Family History  Problem Relation Age of Onset  . Coronary artery disease Mother        deceased  . Deep vein thrombosis Sister   . Coronary artery disease Brother   . Asthma Brother   . Colon cancer Neg Hx      Past medical history, social, surgical and family history all reviewed in electronic medical record.  No pertanent information unless stated regarding to the  chief complaint.   Review of Systems:Review of systems updated and as accurate as of 09/28/17  No headache, visual changes, nausea, vomiting, diarrhea, constipation, dizziness, abdominal pain, skin rash, fevers, chills, night sweats, weight loss, swollen lymph nodes, body aches, joint swelling,, chest pain, shortness of breath, mood changes.  Positive muscle aches  Objective  Blood pressure 100/70, pulse 64, height 5' 10"  (1.778 m), weight 152 lb (68.9 kg), SpO2 96 %. Systems examined below as of 09/28/17   General: No apparent distress alert and oriented x3 mood and affect normal, dressed appropriately.  Mild cachectic HEENT: Pupils equal, extraocular movements intact  Respiratory: Patient's speak in full sentences and does not appear short of breath  Cardiovascular: No lower extremity edema, non tender, no erythema  Skin: Warm dry intact with no signs of infection or rash on extremities or on axial skeleton.  Abdomen: Soft nontender  Neuro: Cranial nerves II through XII are intact, neurovascularly intact in all extremities with 2+ DTRs and 2+ pulses.  Lymph: No lymphadenopathy of posterior or anterior cervical chain or axillae bilaterally.  Gait antalgic MSK:  Non tender with full range of motion and good stability and symmetric strength and tone of shoulders, elbows, wrist, hip, knee bi ilaterally.  Right ankle exam shows severe arthritic changes.  Patient does have angulation of the navicular bone that seems to encompass pronation and patient's medial malleolus seems to even touch the floor.  Significant impingement on the lateral aspect of the ankle and just distal to the lateral malleolus noted with walking.  Antalgic gait.  Limited range of motion in all planes.  Deep tendon reflexes though are intact    Impression and Recommendations:     This case required medical decision making of moderate complexity.      Note: This dictation was prepared with Dragon dictation along with  smaller phrase technology. Any transcriptional errors that result from this process are unintentional.

## 2017-09-28 ENCOUNTER — Ambulatory Visit (INDEPENDENT_AMBULATORY_CARE_PROVIDER_SITE_OTHER): Payer: PPO | Admitting: Family Medicine

## 2017-09-28 ENCOUNTER — Encounter: Payer: Self-pay | Admitting: Family Medicine

## 2017-09-28 DIAGNOSIS — R269 Unspecified abnormalities of gait and mobility: Secondary | ICD-10-CM

## 2017-09-28 NOTE — Patient Instructions (Signed)
I hope this helps Try the changes Call [phone] and give [place] an update in a week or so

## 2017-09-28 NOTE — Assessment & Plan Note (Addendum)
Significant subtalar subluxation and now really almost dislocation.  Patient has marked pronation on the right side as well that is severe.  We did do more stability with patient's orthotics at this moment trying to incorporate the heel on a more regular basis.  This would allow for [place] to unload the lateral aspect of the ankle some.  We will need to make sure that this does not limit the navicular bone though as well.  Patient will slowly increase wear over the course the next several days.  Follow-up with me again in 4 to 8 weeks  Spent  25 minutes with patient face-to-face and had greater than 50% of counseling including as described above in assessment and plan.

## 2017-09-30 DIAGNOSIS — L309 Dermatitis, unspecified: Secondary | ICD-10-CM | POA: Diagnosis not present

## 2017-09-30 DIAGNOSIS — I788 Other diseases of capillaries: Secondary | ICD-10-CM | POA: Diagnosis not present

## 2017-09-30 DIAGNOSIS — D1722 Benign lipomatous neoplasm of skin and subcutaneous tissue of left arm: Secondary | ICD-10-CM | POA: Diagnosis not present

## 2017-10-07 DIAGNOSIS — C61 Malignant neoplasm of prostate: Secondary | ICD-10-CM | POA: Diagnosis not present

## 2017-10-07 LAB — PSA: PSA: 0.35

## 2017-10-14 DIAGNOSIS — C7951 Secondary malignant neoplasm of bone: Secondary | ICD-10-CM | POA: Diagnosis not present

## 2017-10-14 DIAGNOSIS — N401 Enlarged prostate with lower urinary tract symptoms: Secondary | ICD-10-CM | POA: Diagnosis not present

## 2017-10-14 DIAGNOSIS — C61 Malignant neoplasm of prostate: Secondary | ICD-10-CM | POA: Diagnosis not present

## 2017-10-14 DIAGNOSIS — R350 Frequency of micturition: Secondary | ICD-10-CM | POA: Diagnosis not present

## 2017-10-17 ENCOUNTER — Ambulatory Visit (INDEPENDENT_AMBULATORY_CARE_PROVIDER_SITE_OTHER): Payer: PPO | Admitting: Family Medicine

## 2017-10-17 DIAGNOSIS — R269 Unspecified abnormalities of gait and mobility: Secondary | ICD-10-CM

## 2017-10-17 NOTE — Progress Notes (Deleted)
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  No chief complaint on file.   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  ***   Review of Systems  HISTORY: Past Medical, Surgical, Social, and Family History Reviewed & Updated per EMR.   Pertinent Historical Findings include:  Past Medical History:  Diagnosis Date  . Allergic rhinitis   . Anxiety   . Asthma   . Atrial fibrillation ([place])   . CAD (coronary artery disease)   . Depression   . Diverticulosis 03/16/1995  . DJD (degenerative joint disease)    "most joints" (03/11/2015)  . GERD (gastroesophageal reflux disease) 12/30/2000  . Heart attack ([place]) 1984  . Hiatal hernia 12/30/2000  . History of blood transfusion 1988   "related to GI bleeding OR"  . History of duodenal ulcer 10/15/1986  . Hyperlipidemia   . Hypertension   . Sinus bradycardia   . Stroke [facility])     Past Surgical History:  Procedure Laterality Date  . CARDIAC CATHETERIZATION    . CATARACT EXTRACTION W/ INTRAOCULAR LENS  IMPLANT, BILATERAL Bilateral ?2013  . CHOLECYSTECTOMY OPEN  1989  . CORONARY ANGIOPLASTY WITH STENT PLACEMENT  2010  . hospital ccu  1984   heart attack, hypoplastic right coronary  . INGUINAL HERNIA REPAIR Right ~ 1990  . NASAL SEPTUM SURGERY  1972  . ORCHIECTOMY Bilateral 06/10/2017   Procedure: BILATERAL ORCHIECTOMY;  Surgeon: Franchot Gallo, MD;  Location: [facility];  Service: Urology;  Laterality: Bilateral;  MAC ANESTHESIA AND LOCAL  . TONSILLECTOMY    . TOTAL KNEE ARTHROPLASTY Bilateral 1998-2004   "right-left"  . VAGOTOMY AND PYLOROPLASTY  1988   "bleeding duodenal ulcer"    Allergies  Allergen Reactions  . Advil [Ibuprofen] Nausea Only and Rash  . Asa [Aspirin] Rash  . Tylenol [Acetaminophen] Rash    Family History  Problem Relation Age of Onset  . Coronary artery disease Mother        deceased  . Deep vein thrombosis Sister   . Coronary artery  disease Brother   . Asthma Brother   . Colon cancer Neg Hx      Social History   Socioeconomic History  . Marital status: Married    Spouse name: Not on file  . Number of children: 2  . Years of education: Not on file  . Highest education level: Not on file  Occupational History  . Occupation: IT sales professional: RETIRED  Social Needs  . Financial resource strain: Not on file  . Food insecurity:    Worry: Not on file    Inability: Not on file  . Transportation needs:    Medical: Not on file    Non-medical: Not on file  Tobacco Use  . Smoking status: Never Smoker  . Smokeless tobacco: Never Used  Substance and Sexual Activity  . Alcohol use: No  . Drug use: No  . Sexual activity: Not Currently  Lifestyle  . Physical activity:    Days per week: Not on file    Minutes per session: Not on file  . Stress: Not on file  Relationships  . Social connections:    Talks on phone: Not on file    Gets together: Not on file    Attends religious service: Not on file    Active member of club or organization: Not on file  Attends meetings of clubs or organizations: Not on file    Relationship status: Not on file  . Intimate partner violence:    Fear of current or ex partner: Not on file    Emotionally abused: Not on file    Physically abused: Not on file    Forced sexual activity: Not on file  Other Topics Concern  . Not on file  Social History Narrative   Married. Regular exercise - yes.      PHYSICAL EXAM:  VS: There were no vitals taken for this visit. Physical Exam Gen: NAD, alert, cooperative with exam, well-appearing ENT: normal lips, normal nasal mucosa,  Eye: normal EOM, normal conjunctiva and lids CV:  no edema, +2 pedal pulses   Resp: no accessory muscle use, non-labored,  GI: no masses or tenderness, no hernia  Skin: no rashes, no areas of induration  Neuro: normal tone, normal sensation to touch Psych:  normal insight, alert and oriented MSK:  ***       ASSESSMENT & PLAN:   No problem-specific Assessment & Plan notes found for this encounter.

## 2017-10-24 ENCOUNTER — Ambulatory Visit: Payer: PPO | Admitting: Sports Medicine

## 2017-10-24 ENCOUNTER — Encounter: Payer: Self-pay | Admitting: Sports Medicine

## 2017-10-24 VITALS — BP 150/60 | Ht 70.0 in | Wt 152.0 lb

## 2017-10-24 DIAGNOSIS — M67979 Unspecified disorder of synovium and tendon, unspecified ankle and foot: Secondary | ICD-10-CM | POA: Diagnosis not present

## 2017-10-24 NOTE — Progress Notes (Signed)
   Subjective:    Patient ID: [name] Spence, MD, male    DOB: 1929-10-12, 84 y.o.   MRN: [id number]  HPI 84 year old male with known posterior tibialis insufficiency presents for orthotic management.  Patient last seen in sports med clinic on 09/27/2016.  At that time he was having pain primarily his lateral malleolus, secondary to impingement due to his significant talonavicular joint subluxation.  He was seen by his PCP on 09/28/2017 and was given slight modification to his orthotic placing cushion on both the lateral and medial aspect of his heel bone was right foot.  He does not like this is been helping him very much.  He is still been playing golf "powering to the pain".  He has not been trying any topical creams or anything else for the pain.  He was significantly has a symptoms at night when he wakes up to use the bathroom and does not have any support.   Review of Systems Review of systems per HPI  Medical history, allergies, social history, medications reviewed    Objective:   Physical Exam GEN: Elderly Caucasian male, in no acute distress. Cards: Warm, well-perfused.  Palpable DP/PT bilateral feet Pulm: No increased work of breathing, no accessory muscle use Neuro: No focal neurologic deficits.  Sensation intact bilateral feet Gait: Significant pes planus bilaterally, Right greater than left.  MSK Right foot:  Inspection: Pes planus right foot noted along with significant right forefoot abduction. No soft tissue swelling. No swelling around forefoot bones. ROM: Fixed subtalar joint.  Range of motion normal on both plantar and dorsiflexion.  Decreased range of motion of foot eversion and inversion Strength: 5/5 strength in flexor and extensor tendons.  Mildly reduced strength in both eversion and inversion. Neurovascular: PT DP palpable     Assessment & Plan:  An 84 year old male who presents for right lateral ankle pain.  He has known grade 3 posterior tibialis tendinopathy, he  has not wanted surgery for this in the past.  Added reinforcement to medial aspect of right foot orthotic.  Patient did feel like this gave him some added comfort.  Patient interested in scheduling follow-up appointment in around 1 week to have another custom orthotic made.  Grade 3 posterior tibialis tendinopathy - Medial wedge given to existing right foot orthotic - Follow-up in 1 week for new orthotic  Patient seen and evaluated with the resident.  I agree with the above plan of care.  Dr. Caryn Section has customized his orthotics somewhat.  I adjusted the wedge on the right foot alleviate pressure over the lateral ankle.  He will follow-up with me in 1 week for new orthotic construction and we will plan on adding a medial heel wedge to those new orthotics.

## 2017-10-29 ENCOUNTER — Other Ambulatory Visit (HOSPITAL_COMMUNITY): Payer: Self-pay | Admitting: Internal Medicine

## 2017-10-31 ENCOUNTER — Encounter: Payer: Self-pay | Admitting: Sports Medicine

## 2017-10-31 ENCOUNTER — Ambulatory Visit: Payer: PPO | Admitting: Sports Medicine

## 2017-10-31 VITALS — BP 110/68 | Ht 70.0 in | Wt 155.0 lb

## 2017-10-31 DIAGNOSIS — M67979 Unspecified disorder of synovium and tendon, unspecified ankle and foot: Secondary | ICD-10-CM

## 2017-11-01 NOTE — Progress Notes (Signed)
  [name] [name] presents today for custom orthotic.  Please see his office note from October 24, 2017 for details regarding history and physical exam findings.  In short, [name] [name] has significant posterior tibialis tendon insufficiency of the right ankle.  We are going to try to address this with new custom orthotics today.  Custom orthotics were completed today.  After molding the blank and adding blue EVA for the base we added an additional scaphoid pad as well as two medial heel wedges and a first ray post to the right orthotic which helps give support along the entire medial column.  Evaluation of his gait after orthotic construction showed him to be much better controlled with a lot less pronation.  Hopefully [name] [name] will find these orthotics to be beneficial.  Unfortunately, his posterior tibialis tendon insufficiency is quite severe and there is little more to offer in the way of treatment.  Despite this, he remains very active and seems to tolerate this nicely.  Follow-up as needed  Total time of 30 minutes was spent with [name] [name] with greater than 50% of the time spent in face-to-face consultation discussing orthotic construction, instruction, and fitting.  Patient was fitted for a : standard, cushioned, semi-rigid orthotic. The orthotic was heated and afterward the patient stood on the orthotic blank positioned on the orthotic stand. The patient was positioned in subtalar neutral position and 10 degrees of ankle dorsiflexion in a weight bearing stance. After completion of molding, a stable base was applied to the orthotic blank. The blank was ground to a stable position for weight bearing. Size:11 Base: Blue EVA Posting: first ray post on the right Additional orthotic padding: scaphoid pad and two medial heel wedges on the right

## 2017-11-02 ENCOUNTER — Other Ambulatory Visit (HOSPITAL_COMMUNITY): Payer: Self-pay | Admitting: Cardiology

## 2017-11-04 ENCOUNTER — Encounter: Payer: Self-pay | Admitting: Family Medicine

## 2017-11-10 ENCOUNTER — Other Ambulatory Visit (HOSPITAL_COMMUNITY): Payer: Self-pay | Admitting: *Deleted

## 2017-11-10 MED ORDER — APIXABAN 5 MG PO TABS: 5.0000 mg | ORAL_TABLET | Freq: Two times a day (BID) | ORAL | 3 refills | Status: DC

## 2017-11-21 ENCOUNTER — Other Ambulatory Visit: Payer: Self-pay | Admitting: Family Medicine

## 2017-11-21 ENCOUNTER — Telehealth: Payer: Self-pay

## 2017-11-21 ENCOUNTER — Encounter: Payer: Self-pay | Admitting: Family Medicine

## 2017-11-21 ENCOUNTER — Ambulatory Visit (INDEPENDENT_AMBULATORY_CARE_PROVIDER_SITE_OTHER): Payer: PPO | Admitting: Family Medicine

## 2017-11-21 DIAGNOSIS — N401 Enlarged prostate with lower urinary tract symptoms: Secondary | ICD-10-CM

## 2017-11-21 DIAGNOSIS — R351 Nocturia: Secondary | ICD-10-CM

## 2017-11-21 DIAGNOSIS — I10 Essential (primary) hypertension: Secondary | ICD-10-CM

## 2017-11-21 DIAGNOSIS — J45909 Unspecified asthma, uncomplicated: Secondary | ICD-10-CM

## 2017-11-21 MED ORDER — BUPROPION HCL ER (SR) 150 MG PO TB12: 150.0000 mg | ORAL_TABLET | Freq: Every day | ORAL | 4 refills | Status: DC

## 2017-11-21 MED ORDER — DILTIAZEM HCL 30 MG PO TABS: ORAL_TABLET | ORAL | 2 refills | Status: DC

## 2017-11-21 MED ORDER — TRAMADOL HCL 50 MG PO TABS: 50.0000 mg | ORAL_TABLET | Freq: Two times a day (BID) | ORAL | 5 refills | Status: DC | PRN

## 2017-11-21 MED ORDER — ATORVASTATIN CALCIUM 20 MG PO TABS
ORAL_TABLET | ORAL | 4 refills | Status: DC
Start: 2017-11-21 — End: 2018-12-11

## 2017-11-21 MED ORDER — TAMSULOSIN HCL 0.4 MG PO CAPS: 0.4000 mg | ORAL_CAPSULE | Freq: Every day | ORAL | 4 refills | Status: DC

## 2017-11-21 MED ORDER — LORAZEPAM 1 MG PO TABS: ORAL_TABLET | ORAL | 3 refills | Status: DC

## 2017-11-21 NOTE — Patient Instructions (Signed)
Established with Dr. Elease Hashimoto ASAP

## 2017-11-21 NOTE — Progress Notes (Signed)
[name] [name] is a delightful 84 year old married male non-smoker retired Stage manager who comes in today to review his meds and to get refills  He has a structural abnormality with his feet.  He is been to see the folks at sports medicine.  They have tried injections all kinds of things nothing seems to help.  He cannot take NSAIDs because he is on Eliquis.  He was given tramadol 50 mg for which he takes 1/2 to 1 tablet twice daily as needed for severe pain.  He is also seen in follow-up with cardiology.  He clinically asymptomatic on his medications.  He is due to get established with Dr. Elease Hashimoto.  Due a tetanus booster but it cannot be done here.  He will have to go to the pharmacy.  He had the first of his 2 shingles vaccines in September.  He has no other concerns except for his left inguinal hernia that has not gotten bigger and he is asymptomatic.  No exam done today  Med refilled we will get established with Dr. Elease Hashimoto

## 2017-11-21 NOTE — Telephone Encounter (Signed)
Per Dr. Sherren Mocha, Dr. Elease Hashimoto has agreed to accept pt as a New Patient. Please schedule as OV and indicate TOC in appt notes.

## 2017-11-28 ENCOUNTER — Other Ambulatory Visit: Payer: Self-pay | Admitting: Internal Medicine

## 2017-12-19 DIAGNOSIS — D485 Neoplasm of uncertain behavior of skin: Secondary | ICD-10-CM | POA: Diagnosis not present

## 2017-12-19 DIAGNOSIS — D1801 Hemangioma of skin and subcutaneous tissue: Secondary | ICD-10-CM | POA: Diagnosis not present

## 2017-12-19 DIAGNOSIS — L570 Actinic keratosis: Secondary | ICD-10-CM | POA: Diagnosis not present

## 2017-12-19 DIAGNOSIS — L821 Other seborrheic keratosis: Secondary | ICD-10-CM | POA: Diagnosis not present

## 2017-12-19 DIAGNOSIS — L7211 Pilar cyst: Secondary | ICD-10-CM | POA: Diagnosis not present

## 2017-12-19 DIAGNOSIS — L309 Dermatitis, unspecified: Secondary | ICD-10-CM | POA: Diagnosis not present

## 2017-12-22 ENCOUNTER — Encounter: Payer: Self-pay | Admitting: Family Medicine

## 2017-12-22 ENCOUNTER — Ambulatory Visit: Payer: Self-pay

## 2017-12-22 ENCOUNTER — Ambulatory Visit (INDEPENDENT_AMBULATORY_CARE_PROVIDER_SITE_OTHER): Payer: PPO | Admitting: Family Medicine

## 2017-12-22 VITALS — BP 100/60 | HR 68 | Ht 70.0 in | Wt 162.0 lb

## 2017-12-22 DIAGNOSIS — M25579 Pain in unspecified ankle and joints of unspecified foot: Secondary | ICD-10-CM

## 2017-12-22 DIAGNOSIS — M25531 Pain in right wrist: Secondary | ICD-10-CM

## 2017-12-22 DIAGNOSIS — M1811 Unilateral primary osteoarthritis of first carpometacarpal joint, right hand: Secondary | ICD-10-CM

## 2017-12-22 NOTE — Assessment & Plan Note (Signed)
Severe arthritic changes with instability of the ankle.  Will consider possible custom AFO bracing.  We will see if patient would respond to this.

## 2017-12-22 NOTE — Progress Notes (Signed)
[name] [address], [place] [address] Phone: [phone] Subjective:    I Kandace Blitz am serving as a Education administrator for Dr. Hulan Saas.   I'm seeing this patient by the request  of:    CC: Wrist pain  RWE:[id number]  [name] Spence, [name] [name] is a 84 y.o. male coming in with complaint of wrist pain. Wants wrist injection.  Wrist pain.  Has known CMC arthritis.  Patient has had injection.  Last one was greater than 3 months ago.  Worsening pain at this time this week and up at night.  Rates the severity as 9 out of 10 consider stopping him from daily activities.    Past Medical History:  Diagnosis Date  . Allergic rhinitis   . Anxiety   . Asthma   . Atrial fibrillation ([place])   . CAD (coronary artery disease)   . Depression   . Diverticulosis 03/16/1995  . DJD (degenerative joint disease)    "most joints" (03/11/2015)  . GERD (gastroesophageal reflux disease) 12/30/2000  . Heart attack ([place]) 1984  . Hiatal hernia 12/30/2000  . History of blood transfusion 1988   "related to GI bleeding OR"  . History of duodenal ulcer 10/15/1986  . Hyperlipidemia   . Hypertension   . Sinus bradycardia   . Stroke [facility])    Past Surgical History:  Procedure Laterality Date  . CARDIAC CATHETERIZATION    . CATARACT EXTRACTION W/ INTRAOCULAR LENS  IMPLANT, BILATERAL Bilateral ?2013  . CHOLECYSTECTOMY OPEN  1989  . CORONARY ANGIOPLASTY WITH STENT PLACEMENT  2010  . hospital ccu  1984   heart attack, hypoplastic right coronary  . INGUINAL HERNIA REPAIR Right ~ 1990  . NASAL SEPTUM SURGERY  1972  . ORCHIECTOMY Bilateral 06/10/2017   Procedure: BILATERAL ORCHIECTOMY;  Surgeon: Franchot Gallo, [name] [name];  Location: [facility];  Service: Urology;  Laterality: Bilateral;  MAC ANESTHESIA AND LOCAL  . TONSILLECTOMY    . TOTAL KNEE ARTHROPLASTY Bilateral 1998-2004   "right-left"  . VAGOTOMY AND PYLOROPLASTY  1988   "bleeding duodenal ulcer"   Social History    Socioeconomic History  . Marital status: Married    Spouse name: Not on file  . Number of children: 2  . Years of education: Not on file  . Highest education level: Not on file  Occupational History  . Occupation: IT sales professional: RETIRED  Social Needs  . Financial resource strain: Not on file  . Food insecurity:    Worry: Not on file    Inability: Not on file  . Transportation needs:    Medical: Not on file    Non-medical: Not on file  Tobacco Use  . Smoking status: Never Smoker  . Smokeless tobacco: Never Used  Substance and Sexual Activity  . Alcohol use: No  . Drug use: No  . Sexual activity: Not Currently  Lifestyle  . Physical activity:    Days per week: Not on file    Minutes per session: Not on file  . Stress: Not on file  Relationships  . Social connections:    Talks on phone: Not on file    Gets together: Not on file    Attends religious service: Not on file    Active member of club or organization: Not on file    Attends meetings of clubs or organizations: Not on file    Relationship status: Not on file  Other Topics Concern  . Not on  file  Social History Narrative   Married. Regular exercise - yes.    Allergies  Allergen Reactions  . Advil [Ibuprofen] Nausea Only and Rash  . Asa [Aspirin] Rash  . Tylenol [Acetaminophen] Rash   Family History  Problem Relation Age of Onset  . Coronary artery disease Mother        deceased  . Deep vein thrombosis Sister   . Coronary artery disease Brother   . Asthma Brother   . Colon cancer Neg Hx      Current Outpatient Medications (Cardiovascular):  .  atenolol (TENORMIN) 25 MG tablet, Take 1 tablet (25 mg total) by mouth daily. Marland Kitchen  atorvastatin (LIPITOR) 20 MG tablet, TAKE 1 TABLET(20 MG) BY MOUTH DAILY .  diltiazem (CARDIZEM) 30 MG tablet, Take 1 tablet every 4 hours AS NEEDED for afib heart rate >100 .  dofetilide (TIKOSYN) 250 MCG capsule, TAKE 1 CAPSULE(250 MCG) BY MOUTH TWICE DAILY .   nitroGLYCERIN (NITROSTAT) 0.4 MG SL tablet, Place 1 tablet (0.4 mg total) under the tongue every 5 (five) minutes x 3 doses as needed for chest pain.  Current Outpatient Medications (Respiratory):  Marland Kitchen  ARNUITY ELLIPTA 200 MCG/ACT AEPB, INHALE 1 PUFF INTO THE LUNGS DAILY .  cetirizine (ZYRTEC) 10 MG tablet, Take 10 mg by mouth daily with supper.  .  levalbuterol (XOPENEX HFA) 45 MCG/ACT inhaler, Inhale 1 puff into the lungs every 4 (four) hours as needed for wheezing or shortness of breath. .  mometasone (NASONEX) 50 MCG/ACT nasal spray, Place 1 spray into the nose daily as needed (for allergies).   Current Outpatient Medications (Analgesics):  .  oxyCODONE (ROXICODONE) 5 MG immediate release tablet, Take 1 tablet (5 mg total) by mouth every 4 (four) hours as needed for severe pain. .  traMADol (ULTRAM) 50 MG tablet, Take 1 tablet (50 mg total) by mouth every 12 (twelve) hours as needed.  Current Outpatient Medications (Hematological):  .  apixaban (ELIQUIS) 5 MG TABS tablet, Take 1 tablet (5 mg total) by mouth 2 (two) times daily.  Current Outpatient Medications (Other):  Marland Kitchen  buPROPion (WELLBUTRIN SR) 150 MG 12 hr tablet, Take 1 tablet (150 mg total) by mouth daily. .  Cholecalciferol (VITAMIN D) 2000 units tablet, Take 2,000 Units by mouth daily. Marland Kitchen  LORazepam (ATIVAN) 1 MG tablet, TAKE 1/2 TO 1 TABLET BY MOUTH EVERY NIGHT AT BEDTIME AS NEEDED .  omeprazole (PRILOSEC OTC) 20 MG tablet, Take 1 tablet (20 mg total) by mouth daily. .  tamsulosin (FLOMAX) 0.4 MG CAPS capsule, Take 1 capsule (0.4 mg total) by mouth daily.    Past medical history, social, surgical and family history all reviewed in electronic medical record.  No pertanent information unless stated regarding to the chief complaint.   Review of Systems:  No headache, visual changes, nausea, vomiting, diarrhea, constipation, dizziness, abdominal pain, skin rash, fevers, chills, night sweats, weight loss, swollen lymph nodes, b chest  pain, shortness of breath, mood changes.  Positive body aches and muscle aches  Objective  Blood pressure 100/60, pulse 68, height _0  (1.778 m), weight 162 lb (73.5 kg), SpO2 94 %.   General: No apparent distress alert and oriented x3 mood and affect normal, dressed appropriately.  HEENT: Pupils equal, extraocular movements intact  Respiratory: Patient's speak in full sentences and does not appear short of breath  Cardiovascular: No lower extremity edema, non tender, no erythema  Skin: Warm dry intact with no signs of infection or rash on extremities or  on axial skeleton.  Abdomen: Soft nontender  Neuro: Cranial nerves II through XII are intact, neurovascularly intact in all extremities with 2+ DTRs and 2+ pulses.  Lymph: No lymphadenopathy of posterior or anterior cervical chain or axillae bilaterally.  Gait n severely antalgic MSK:  Non tender with full range of motion and good stability and symmetric strength and tone of shoulders, elbows, wrist, hip, knee and bilaterally.  Arthritic changes with severe arthritic changes of patient's ankle  Procedure: Real-time Ultrasound Guided Injection of right CMC arthritis Device: GE Logiq Q7 Ultrasound guided injection is preferred based studies that show increased duration, increased effect, greater accuracy, decreased procedural pain, increased response rate, and decreased cost with ultrasound guided versus blind injection.  Verbal informed consent obtained.  Time-out conducted.  Noted no overlying erythema, induration, or other signs of local infection.  Skin prepped in a sterile fashion.  Local anesthesia: Topical Ethyl chloride.  With sterile technique and under real time ultrasound guidance: With a 25-gauge half inch needle patient was injected with 0.5 cc of 0.5% Marcaine and 0.5 cc of Kenalog 40 mg/mL into the [facility] joint. Completed without difficulty  Pain immediately resolved suggesting accurate placement of the medication.  Advised to  call if fevers/chills, erythema, induration, drainage, or persistent bleeding.  Images permanently stored and available for review in the ultrasound unit.  Impression: Technically successful ultrasound guided injection.   Impression and Recommendations:     This case required medical decision making of moderate complexity. The above documentation has been reviewed and is accurate and complete Lyndal Pulley, DO       Note: This dictation was prepared with Dragon dictation along with smaller phrase technology. Any transcriptional errors that result from this process are unintentional.

## 2017-12-22 NOTE — Patient Instructions (Signed)
Good to see you  I will keep you updated on the ankle brace Ice when you need it See me again in 10 weeks

## 2017-12-22 NOTE — Assessment & Plan Note (Signed)
Repeat injection given again today.  Discussed icing regimen and home exercise.  Discussed potential bracing at night.  Patient has responded well to every 3 months of injections.  Follow-up again in 10 weeks.

## 2018-01-18 ENCOUNTER — Other Ambulatory Visit: Payer: Self-pay | Admitting: *Deleted

## 2018-01-18 MED ORDER — LORAZEPAM 1 MG PO TABS: ORAL_TABLET | ORAL | 1 refills | Status: DC

## 2018-01-24 ENCOUNTER — Other Ambulatory Visit (HOSPITAL_COMMUNITY): Payer: Self-pay | Admitting: Internal Medicine

## 2018-02-13 DIAGNOSIS — C44529 Squamous cell carcinoma of skin of other part of trunk: Secondary | ICD-10-CM | POA: Diagnosis not present

## 2018-02-22 DIAGNOSIS — R351 Nocturia: Secondary | ICD-10-CM | POA: Diagnosis not present

## 2018-02-22 DIAGNOSIS — N401 Enlarged prostate with lower urinary tract symptoms: Secondary | ICD-10-CM | POA: Diagnosis not present

## 2018-02-22 DIAGNOSIS — C61 Malignant neoplasm of prostate: Secondary | ICD-10-CM | POA: Diagnosis not present

## 2018-02-22 LAB — PSA: PSA: 0.081

## 2018-02-25 ENCOUNTER — Inpatient Hospital Stay (HOSPITAL_COMMUNITY)
Admission: EM | Admit: 2018-02-25 | Discharge: 2018-03-03 | DRG: 871 | Disposition: A | Discharge status: Home Health | Payer: PPO | Attending: Internal Medicine | Admitting: Internal Medicine

## 2018-02-25 ENCOUNTER — Other Ambulatory Visit: Payer: Self-pay

## 2018-02-25 ENCOUNTER — Encounter (HOSPITAL_COMMUNITY): Payer: Self-pay

## 2018-02-25 ENCOUNTER — Emergency Department (HOSPITAL_COMMUNITY): Payer: PPO

## 2018-02-25 DIAGNOSIS — Z955 Presence of coronary angioplasty implant and graft: Secondary | ICD-10-CM

## 2018-02-25 DIAGNOSIS — J45901 Unspecified asthma with (acute) exacerbation: Secondary | ICD-10-CM | POA: Diagnosis present

## 2018-02-25 DIAGNOSIS — Z9841 Cataract extraction status, right eye: Secondary | ICD-10-CM | POA: Diagnosis not present

## 2018-02-25 DIAGNOSIS — I4819 Other persistent atrial fibrillation: Secondary | ICD-10-CM | POA: Diagnosis present

## 2018-02-25 DIAGNOSIS — J1000 Influenza due to other identified influenza virus with unspecified type of pneumonia: Secondary | ICD-10-CM | POA: Diagnosis present

## 2018-02-25 DIAGNOSIS — Z8546 Personal history of malignant neoplasm of prostate: Secondary | ICD-10-CM

## 2018-02-25 DIAGNOSIS — Z9842 Cataract extraction status, left eye: Secondary | ICD-10-CM

## 2018-02-25 DIAGNOSIS — Z8249 Family history of ischemic heart disease and other diseases of the circulatory system: Secondary | ICD-10-CM | POA: Diagnosis not present

## 2018-02-25 DIAGNOSIS — M1990 Unspecified osteoarthritis, unspecified site: Secondary | ICD-10-CM | POA: Diagnosis present

## 2018-02-25 DIAGNOSIS — R0689 Other abnormalities of breathing: Secondary | ICD-10-CM | POA: Diagnosis not present

## 2018-02-25 DIAGNOSIS — Z961 Presence of intraocular lens: Secondary | ICD-10-CM | POA: Diagnosis not present

## 2018-02-25 DIAGNOSIS — I5032 Chronic diastolic (congestive) heart failure: Secondary | ICD-10-CM | POA: Diagnosis present

## 2018-02-25 DIAGNOSIS — Z8709 Personal history of other diseases of the respiratory system: Secondary | ICD-10-CM | POA: Diagnosis not present

## 2018-02-25 DIAGNOSIS — I10 Essential (primary) hypertension: Secondary | ICD-10-CM | POA: Diagnosis present

## 2018-02-25 DIAGNOSIS — J189 Pneumonia, unspecified organism: Secondary | ICD-10-CM | POA: Diagnosis not present

## 2018-02-25 DIAGNOSIS — Z7901 Long term (current) use of anticoagulants: Secondary | ICD-10-CM

## 2018-02-25 DIAGNOSIS — J9601 Acute respiratory failure with hypoxia: Secondary | ICD-10-CM

## 2018-02-25 DIAGNOSIS — F419 Anxiety disorder, unspecified: Secondary | ICD-10-CM | POA: Diagnosis not present

## 2018-02-25 DIAGNOSIS — I252 Old myocardial infarction: Secondary | ICD-10-CM

## 2018-02-25 DIAGNOSIS — A419 Sepsis, unspecified organism: Principal | ICD-10-CM | POA: Diagnosis present

## 2018-02-25 DIAGNOSIS — I480 Paroxysmal atrial fibrillation: Secondary | ICD-10-CM | POA: Diagnosis not present

## 2018-02-25 DIAGNOSIS — Z825 Family history of asthma and other chronic lower respiratory diseases: Secondary | ICD-10-CM

## 2018-02-25 DIAGNOSIS — R6520 Severe sepsis without septic shock: Secondary | ICD-10-CM

## 2018-02-25 DIAGNOSIS — Z8673 Personal history of transient ischemic attack (TIA), and cerebral infarction without residual deficits: Secondary | ICD-10-CM

## 2018-02-25 DIAGNOSIS — Z886 Allergy status to analgesic agent status: Secondary | ICD-10-CM

## 2018-02-25 DIAGNOSIS — J181 Lobar pneumonia, unspecified organism: Secondary | ICD-10-CM | POA: Diagnosis not present

## 2018-02-25 DIAGNOSIS — I493 Ventricular premature depolarization: Secondary | ICD-10-CM | POA: Diagnosis present

## 2018-02-25 DIAGNOSIS — Z9861 Coronary angioplasty status: Secondary | ICD-10-CM

## 2018-02-25 DIAGNOSIS — K219 Gastro-esophageal reflux disease without esophagitis: Secondary | ICD-10-CM | POA: Diagnosis not present

## 2018-02-25 DIAGNOSIS — E872 Acidosis: Secondary | ICD-10-CM | POA: Diagnosis not present

## 2018-02-25 DIAGNOSIS — I130 Hypertensive heart and chronic kidney disease with heart failure and stage 1 through stage 4 chronic kidney disease, or unspecified chronic kidney disease: Secondary | ICD-10-CM | POA: Diagnosis not present

## 2018-02-25 DIAGNOSIS — E7800 Pure hypercholesterolemia, unspecified: Secondary | ICD-10-CM | POA: Diagnosis not present

## 2018-02-25 DIAGNOSIS — N182 Chronic kidney disease, stage 2 (mild): Secondary | ICD-10-CM | POA: Diagnosis present

## 2018-02-25 DIAGNOSIS — R9431 Abnormal electrocardiogram [ECG] [EKG]: Secondary | ICD-10-CM | POA: Diagnosis present

## 2018-02-25 DIAGNOSIS — F329 Major depressive disorder, single episode, unspecified: Secondary | ICD-10-CM | POA: Diagnosis present

## 2018-02-25 DIAGNOSIS — Z79891 Long term (current) use of opiate analgesic: Secondary | ICD-10-CM

## 2018-02-25 DIAGNOSIS — R0902 Hypoxemia: Secondary | ICD-10-CM | POA: Diagnosis not present

## 2018-02-25 DIAGNOSIS — E785 Hyperlipidemia, unspecified: Secondary | ICD-10-CM | POA: Diagnosis present

## 2018-02-25 DIAGNOSIS — R000 Tachycardia, unspecified: Secondary | ICD-10-CM | POA: Diagnosis not present

## 2018-02-25 DIAGNOSIS — I2510 Atherosclerotic heart disease of native coronary artery without angina pectoris: Secondary | ICD-10-CM | POA: Diagnosis not present

## 2018-02-25 DIAGNOSIS — Z96653 Presence of artificial knee joint, bilateral: Secondary | ICD-10-CM | POA: Diagnosis present

## 2018-02-25 DIAGNOSIS — Z79899 Other long term (current) drug therapy: Secondary | ICD-10-CM

## 2018-02-25 DIAGNOSIS — Z9049 Acquired absence of other specified parts of digestive tract: Secondary | ICD-10-CM

## 2018-02-25 DIAGNOSIS — J101 Influenza due to other identified influenza virus with other respiratory manifestations: Secondary | ICD-10-CM | POA: Diagnosis present

## 2018-02-25 DIAGNOSIS — J13 Pneumonia due to Streptococcus pneumoniae: Secondary | ICD-10-CM | POA: Diagnosis not present

## 2018-02-25 DIAGNOSIS — I4891 Unspecified atrial fibrillation: Secondary | ICD-10-CM | POA: Diagnosis not present

## 2018-02-25 DIAGNOSIS — R0602 Shortness of breath: Secondary | ICD-10-CM | POA: Diagnosis not present

## 2018-02-25 DIAGNOSIS — K449 Diaphragmatic hernia without obstruction or gangrene: Secondary | ICD-10-CM | POA: Diagnosis present

## 2018-02-25 DIAGNOSIS — Z7951 Long term (current) use of inhaled steroids: Secondary | ICD-10-CM

## 2018-02-25 DIAGNOSIS — I272 Pulmonary hypertension, unspecified: Secondary | ICD-10-CM | POA: Diagnosis present

## 2018-02-25 LAB — INFLUENZA PANEL BY PCR (TYPE A & B)
Influenza A By PCR: NEGATIVE
Influenza B By PCR: POSITIVE — AB

## 2018-02-25 LAB — CBC WITH DIFFERENTIAL/PLATELET
Abs Immature Granulocytes: 0.1 10*3/uL — ABNORMAL HIGH (ref 0.00–0.07)
Basophils Absolute: 0 10*3/uL (ref 0.0–0.1)
Basophils Relative: 0 %
Eosinophils Absolute: 0.1 10*3/uL (ref 0.0–0.5)
Eosinophils Relative: 2 %
HCT: 45.3 % (ref 39.0–52.0)
Hemoglobin: 13.9 g/dL (ref 13.0–17.0)
Lymphocytes Relative: 11 %
Lymphs Abs: 0.7 10*3/uL (ref 0.7–4.0)
MCH: 29.6 pg (ref 26.0–34.0)
MCHC: 30.7 g/dL (ref 30.0–36.0)
MCV: 96.4 fL (ref 80.0–100.0)
Metamyelocytes Relative: 1 %
Monocytes Absolute: 0.1 10*3/uL (ref 0.1–1.0)
Monocytes Relative: 2 %
NEUTROS PCT: 84 %
Neutro Abs: 5.6 10*3/uL (ref 1.7–7.7)
Platelets: 163 10*3/uL (ref 150–400)
RBC: 4.7 MIL/uL (ref 4.22–5.81)
RDW: 13.2 % (ref 11.5–15.5)
WBC: 6.7 10*3/uL (ref 4.0–10.5)
nRBC: 0 % (ref 0.0–0.2)
nRBC: 0 /100 WBC

## 2018-02-25 LAB — URINALYSIS, ROUTINE W REFLEX MICROSCOPIC
Bacteria, UA: NONE SEEN
Bilirubin Urine: NEGATIVE
Glucose, UA: NEGATIVE mg/dL
Ketones, ur: NEGATIVE mg/dL
Leukocytes, UA: NEGATIVE
Nitrite: NEGATIVE
Protein, ur: NEGATIVE mg/dL
Specific Gravity, Urine: 1.011 (ref 1.005–1.030)
pH: 5 (ref 5.0–8.0)

## 2018-02-25 LAB — PROTIME-INR
INR: 1.16
Prothrombin Time: 14.7 seconds (ref 11.4–15.2)

## 2018-02-25 LAB — EXPECTORATED SPUTUM ASSESSMENT W GRAM STAIN, RFLX TO RESP C

## 2018-02-25 LAB — COMPREHENSIVE METABOLIC PANEL
ALT: 21 U/L (ref 0–44)
AST: 24 U/L (ref 15–41)
Albumin: 3.5 g/dL (ref 3.5–5.0)
Alkaline Phosphatase: 91 U/L (ref 38–126)
Anion gap: 13 (ref 5–15)
BUN: 25 mg/dL — ABNORMAL HIGH (ref 8–23)
CO2: 23 mmol/L (ref 22–32)
Calcium: 8.9 mg/dL (ref 8.9–10.3)
Chloride: 102 mmol/L (ref 98–111)
Creatinine, Ser: 1.27 mg/dL — ABNORMAL HIGH (ref 0.61–1.24)
GFR calc Af Amer: 59 mL/min — ABNORMAL LOW (ref >60)
GFR calc non Af Amer: 51 mL/min — ABNORMAL LOW (ref >60)
Glucose, Bld: 148 mg/dL — ABNORMAL HIGH (ref 70–99)
Potassium: 4.5 mmol/L (ref 3.5–5.1)
Sodium: 138 mmol/L (ref 135–145)
Total Bilirubin: 0.6 mg/dL (ref 0.3–1.2)
Total Protein: 6.6 g/dL (ref 6.5–8.1)

## 2018-02-25 LAB — STREP PNEUMONIAE URINARY ANTIGEN: STREP PNEUMO URINARY ANTIGEN: NEGATIVE

## 2018-02-25 LAB — I-STAT CG4 LACTIC ACID, ED
LACTIC ACID, VENOUS: 1.91 mmol/L — AB (ref 0.5–1.9)
Lactic Acid, Venous: 3.84 mmol/L (ref 0.5–1.9)

## 2018-02-25 LAB — I-STAT TROPONIN, ED: TROPONIN I, POC: 0 ng/mL (ref 0.00–0.08)

## 2018-02-25 LAB — MRSA PCR SCREENING: MRSA by PCR: NEGATIVE

## 2018-02-25 LAB — PROCALCITONIN: PROCALCITONIN: 34.5 ng/mL

## 2018-02-25 MED ORDER — PANTOPRAZOLE SODIUM 20 MG PO TBEC
20.0000 mg | DELAYED_RELEASE_TABLET | Freq: Every day | ORAL | Status: DC
  Administered 2018-02-26 – 2018-03-03 (×6): 20 mg via ORAL
  Filled 2018-02-25 (×7): qty 1

## 2018-02-25 MED ORDER — METOPROLOL TARTRATE 12.5 MG HALF TABLET
12.5000 mg | ORAL_TABLET | Freq: Two times a day (BID) | ORAL | Status: DC
  Administered 2018-02-25 – 2018-02-27 (×4): 12.5 mg via ORAL
  Filled 2018-02-25 (×4): qty 1

## 2018-02-25 MED ORDER — ONDANSETRON HCL 4 MG/2ML IJ SOLN: 4.0000 mg | Freq: Four times a day (QID) | INTRAMUSCULAR | Status: DC | PRN

## 2018-02-25 MED ORDER — SODIUM CHLORIDE 0.9 % IV BOLUS
500.0000 mL | Freq: Once | INTRAVENOUS | Status: AC
  Administered 2018-02-25: 500 mL via INTRAVENOUS

## 2018-02-25 MED ORDER — LORAZEPAM 1 MG PO TABS
0.5000 mg | ORAL_TABLET | Freq: Every evening | ORAL | Status: DC | PRN
  Administered 2018-02-26 – 2018-03-02 (×6): 0.5 mg via ORAL
  Filled 2018-02-25 (×6): qty 1

## 2018-02-25 MED ORDER — SODIUM CHLORIDE 0.9 % IV SOLN
INTRAVENOUS | Status: DC
  Administered 2018-02-25 (×2): via INTRAVENOUS

## 2018-02-25 MED ORDER — ONDANSETRON HCL 4 MG PO TABS: 4.0000 mg | ORAL_TABLET | Freq: Four times a day (QID) | ORAL | Status: DC | PRN

## 2018-02-25 MED ORDER — MAGNESIUM SULFATE 2 GM/50ML IV SOLN
2.0000 g | Freq: Once | INTRAVENOUS | Status: AC
  Administered 2018-02-25: 2 g via INTRAVENOUS
  Filled 2018-02-25: qty 50

## 2018-02-25 MED ORDER — DOXYCYCLINE HYCLATE 100 MG PO TABS: 100.0000 mg | ORAL_TABLET | Freq: Two times a day (BID) | ORAL | Status: DC

## 2018-02-25 MED ORDER — SODIUM CHLORIDE 0.9 % IV BOLUS: 1000.0000 mL | Freq: Once | INTRAVENOUS | Status: DC

## 2018-02-25 MED ORDER — LORATADINE 10 MG PO TABS
10.0000 mg | ORAL_TABLET | Freq: Every day | ORAL | Status: DC
  Administered 2018-02-25 – 2018-03-03 (×7): 10 mg via ORAL
  Filled 2018-02-25 (×7): qty 1

## 2018-02-25 MED ORDER — FLUTICASONE PROPIONATE 50 MCG/ACT NA SUSP
1.0000 | Freq: Every day | NASAL | Status: DC
  Administered 2018-02-25 – 2018-03-03 (×7): 1 via NASAL
  Filled 2018-02-25: qty 16

## 2018-02-25 MED ORDER — NITROGLYCERIN 0.4 MG SL SUBL: 0.4000 mg | SUBLINGUAL_TABLET | SUBLINGUAL | Status: DC | PRN

## 2018-02-25 MED ORDER — SODIUM CHLORIDE 0.9 % IV SOLN
500.0000 mg | INTRAVENOUS | Status: DC
  Administered 2018-02-25: 500 mg via INTRAVENOUS
  Filled 2018-02-25: qty 500

## 2018-02-25 MED ORDER — APIXABAN 5 MG PO TABS: 5.0000 mg | ORAL_TABLET | Freq: Two times a day (BID) | ORAL | Status: DC

## 2018-02-25 MED ORDER — IPRATROPIUM-ALBUTEROL 0.5-2.5 (3) MG/3ML IN SOLN: 3.0000 mL | Freq: Three times a day (TID) | RESPIRATORY_TRACT | Status: DC

## 2018-02-25 MED ORDER — FLUTICASONE FUROATE-VILANTEROL 200-25 MCG/INH IN AEPB
1.0000 | INHALATION_SPRAY | Freq: Every day | RESPIRATORY_TRACT | Status: DC
  Administered 2018-02-26 – 2018-02-27 (×2): 1 via RESPIRATORY_TRACT
  Filled 2018-02-25: qty 28

## 2018-02-25 MED ORDER — DOFETILIDE 250 MCG PO CAPS: 250.0000 ug | ORAL_CAPSULE | Freq: Two times a day (BID) | ORAL | Status: DC

## 2018-02-25 MED ORDER — BUPROPION HCL ER (SR) 150 MG PO TB12
150.0000 mg | ORAL_TABLET | Freq: Every day | ORAL | Status: DC
  Administered 2018-02-25 – 2018-03-03 (×7): 150 mg via ORAL
  Filled 2018-02-25 (×7): qty 1

## 2018-02-25 MED ORDER — OSELTAMIVIR PHOSPHATE 30 MG PO CAPS
30.0000 mg | ORAL_CAPSULE | Freq: Two times a day (BID) | ORAL | Status: DC
  Administered 2018-02-25 (×2): 30 mg via ORAL
  Filled 2018-02-25 (×2): qty 1

## 2018-02-25 MED ORDER — IPRATROPIUM-ALBUTEROL 0.5-2.5 (3) MG/3ML IN SOLN
3.0000 mL | Freq: Three times a day (TID) | RESPIRATORY_TRACT | Status: DC
  Administered 2018-02-25 – 2018-02-26 (×3): 3 mL via RESPIRATORY_TRACT
  Filled 2018-02-25 (×3): qty 3

## 2018-02-25 MED ORDER — SODIUM CHLORIDE 0.9 % IV SOLN
2.0000 g | INTRAVENOUS | Status: DC
  Administered 2018-02-25 – 2018-03-03 (×7): 2 g via INTRAVENOUS
  Filled 2018-02-25 (×7): qty 20

## 2018-02-25 MED ORDER — VITAMIN D 25 MCG (1000 UNIT) PO TABS
2000.0000 [IU] | ORAL_TABLET | Freq: Every day | ORAL | Status: DC
  Administered 2018-02-25 – 2018-03-03 (×7): 2000 [IU] via ORAL
  Filled 2018-02-25 (×7): qty 2

## 2018-02-25 MED ORDER — POLYETHYLENE GLYCOL 3350 17 G PO PACK
17.0000 g | PACK | Freq: Every day | ORAL | Status: DC | PRN
  Filled 2018-02-25: qty 1

## 2018-02-25 MED ORDER — ATORVASTATIN CALCIUM 10 MG PO TABS: 20.0000 mg | ORAL_TABLET | Freq: Every day | ORAL | Status: DC

## 2018-02-25 MED ORDER — ACETAMINOPHEN 325 MG PO TABS
650.0000 mg | ORAL_TABLET | Freq: Once | ORAL | Status: DC | PRN
  Filled 2018-02-25: qty 2

## 2018-02-25 MED ORDER — ATENOLOL 25 MG PO TABS
25.0000 mg | ORAL_TABLET | Freq: Every day | ORAL | Status: DC
  Filled 2018-02-25: qty 1

## 2018-02-25 MED ORDER — VITAMIN D 25 MCG (1000 UNIT) PO TABS: 2000.0000 [IU] | ORAL_TABLET | Freq: Every day | ORAL | Status: DC

## 2018-02-25 MED ORDER — TAMSULOSIN HCL 0.4 MG PO CAPS
0.4000 mg | ORAL_CAPSULE | Freq: Every day | ORAL | Status: DC
  Administered 2018-02-25 – 2018-03-03 (×7): 0.4 mg via ORAL
  Filled 2018-02-25 (×7): qty 1

## 2018-02-25 MED ORDER — ALBUTEROL SULFATE (2.5 MG/3ML) 0.083% IN NEBU: 2.5000 mg | INHALATION_SOLUTION | Freq: Four times a day (QID) | RESPIRATORY_TRACT | Status: DC | PRN

## 2018-02-25 MED ORDER — SODIUM CHLORIDE 0.9 % IV SOLN: 1.0000 g | INTRAVENOUS | Status: DC

## 2018-02-25 MED ORDER — LORATADINE 10 MG PO TABS: 10.0000 mg | ORAL_TABLET | Freq: Every day | ORAL | Status: DC

## 2018-02-25 MED ORDER — OXYCODONE HCL 5 MG PO TABS: 5.0000 mg | ORAL_TABLET | ORAL | Status: DC | PRN

## 2018-02-25 MED ORDER — DOXYCYCLINE HYCLATE 100 MG PO TABS
100.0000 mg | ORAL_TABLET | Freq: Two times a day (BID) | ORAL | Status: DC
  Administered 2018-02-25 – 2018-03-03 (×13): 100 mg via ORAL
  Filled 2018-02-25 (×13): qty 1

## 2018-02-25 MED ORDER — APIXABAN 5 MG PO TABS
5.0000 mg | ORAL_TABLET | Freq: Two times a day (BID) | ORAL | Status: DC
  Administered 2018-02-25 – 2018-03-03 (×12): 5 mg via ORAL
  Filled 2018-02-25 (×13): qty 1

## 2018-02-25 MED ORDER — PANTOPRAZOLE SODIUM 20 MG PO TBEC
20.0000 mg | DELAYED_RELEASE_TABLET | Freq: Every day | ORAL | Status: DC
  Administered 2018-02-25: 20 mg via ORAL
  Filled 2018-02-25: qty 1

## 2018-02-25 MED ORDER — TRAMADOL HCL 50 MG PO TABS
50.0000 mg | ORAL_TABLET | Freq: Four times a day (QID) | ORAL | Status: DC | PRN
  Administered 2018-02-25: 50 mg via ORAL
  Filled 2018-02-25: qty 1

## 2018-02-25 MED ORDER — ATORVASTATIN CALCIUM 10 MG PO TABS
20.0000 mg | ORAL_TABLET | Freq: Every day | ORAL | Status: DC
  Administered 2018-02-25 – 2018-03-03 (×7): 20 mg via ORAL
  Filled 2018-02-25 (×7): qty 2

## 2018-02-25 MED ORDER — TAMSULOSIN HCL 0.4 MG PO CAPS: 0.4000 mg | ORAL_CAPSULE | Freq: Every day | ORAL | Status: DC

## 2018-02-25 NOTE — ED Provider Notes (Signed)
[facility] EMERGENCY DEPARTMENT Provider Note   CSN: [id number] Arrival date & time: 02/25/18  6203     History   Chief Complaint Chief Complaint  Patient presents with  . Shortness of Breath    HPI [name] Spence, [name] [name] is a 85 y.o. male.  The history is provided by the patient, medical records and the spouse. No language interpreter was used.  Shortness of Breath   [name] Spence, [name] [name] is a 85 y.o. male who presents to the Emergency Department complaining of sob. He presents to the emergency department accompanied by his wife for evaluation of shortness of breath. He became ill on Tuesday with cough, sinus congestion and shortness of breath. He denies any fevers, chest pain, hemoptysis, abdominal pain, leg swelling or pain. He has a history of atrial fibrillation and is concerned that he has CHF. He did receive his influenza vaccine. Symptoms are severe, constant, worsening. Past Medical History:  Diagnosis Date  . Allergic rhinitis   . Anxiety   . Asthma   . Atrial fibrillation ([place])   . CAD (coronary artery disease)   . Depression   . Diverticulosis 03/16/1995  . DJD (degenerative joint disease)    "most joints" (03/11/2015)  . GERD (gastroesophageal reflux disease) 12/30/2000  . Heart attack ([place]) 1984  . Hiatal hernia 12/30/2000  . History of blood transfusion 1988   "related to GI bleeding OR"  . History of duodenal ulcer 10/15/1986  . Hyperlipidemia   . Hypertension   . Sinus bradycardia   . Stroke [facility])     Patient Active Problem List   Diagnosis Date Noted  . BPH associated with nocturia 11/21/2017  . Arthritis of carpometacarpal [facility]) joint of right thumb 07/06/2016  . Right foot pain 02/11/2016  . Abnormality of gait 11/27/2015  . Pain in joint, ankle and foot 11/27/2015  . History of asthma 10/23/2015  . Chronic diastolic heart failure ([place])   . Acute hemorrhagic infarction of brain ([place])   . Hypersensitivity reaction 04/01/2015  .  Persistent atrial fibrillation 04/01/2015  . ICH (intracerebral hemorrhage) ([place])   . TIA (transient ischemic attack) 03/31/2015  . Visit for monitoring Tikosyn therapy 03/11/2015  . Paroxysmal atrial fibrillation ([facility])   . Pulmonary hypertension ([place]) 09/26/2014  . Bradycardia 12/21/2012  . Diastolic dysfunction 55/97/4163  . CAD S/P OM PCI 2010 12/16/2008  . Hyperlipidemia 06/20/2007  . Anxiety state 06/20/2007  . Essential hypertension 06/20/2007  . Allergic rhinitis 06/20/2007  . Mild persistent asthma without complication 84/53/6468  . GERD 06/20/2007    Past Surgical History:  Procedure Laterality Date  . CARDIAC CATHETERIZATION    . CATARACT EXTRACTION W/ INTRAOCULAR LENS  IMPLANT, BILATERAL Bilateral ?2013  . CHOLECYSTECTOMY OPEN  1989  . CORONARY ANGIOPLASTY WITH STENT PLACEMENT  2010  . hospital ccu  1984   heart attack, hypoplastic right coronary  . INGUINAL HERNIA REPAIR Right ~ 1990  . NASAL SEPTUM SURGERY  1972  . ORCHIECTOMY Bilateral 06/10/2017   Procedure: BILATERAL ORCHIECTOMY;  Surgeon: Franchot Gallo, [name] [name];  Location: [facility] ORS;  Service: Urology;  Laterality: Bilateral;  MAC ANESTHESIA AND LOCAL  . TONSILLECTOMY    . TOTAL KNEE ARTHROPLASTY Bilateral 1998-2004   "right-left"  . VAGOTOMY AND PYLOROPLASTY  1988   "bleeding duodenal ulcer"        Home Medications    Prior to Admission medications   Medication Sig Start Date End Date Taking? Authorizing Provider  albuterol (PROVENTIL HFA;VENTOLIN HFA) 108 (  90 Base) MCG/ACT inhaler Inhale 1-2 puffs into the lungs every 6 (six) hours as needed for wheezing or shortness of breath.   Yes Provider, Historical, [name] [name]  apixaban (ELIQUIS) 5 MG TABS tablet Take 1 tablet (5 mg total) by mouth 2 (two) times daily. 11/10/17  Yes Larey Dresser, [name] [name]  atenolol (TENORMIN) 25 MG tablet Take 1 tablet (25 mg total) by mouth daily. 09/26/17  Yes Larey Dresser, [name] [name]  ARNUITY ELLIPTA 200 MCG/ACT AEPB INHALE 1 PUFF INTO THE LUNGS  DAILY Patient taking differently: Inhale 1 puff into the lungs daily.  11/29/17   Brand Males, [name] [name]  atorvastatin (LIPITOR) 20 MG tablet TAKE 1 TABLET(20 MG) BY MOUTH DAILY Patient taking differently: Take 20 mg by mouth daily.  11/21/17   [name], [name] [name]  buPROPion (WELLBUTRIN SR) 150 MG 12 hr tablet Take 1 tablet (150 mg total) by mouth daily. 11/21/17   [name], [name] [name]  cetirizine (ZYRTEC) 10 MG tablet Take 10 mg by mouth daily with supper.     Provider, Historical, [name] [name]  Cholecalciferol (VITAMIN D) 2000 units tablet Take 2,000 Units by mouth daily.    Provider, Historical, [name] [name]  diltiazem (CARDIZEM) 30 MG tablet Take 1 tablet every 4 hours AS NEEDED for afib heart rate >100 11/21/17   [name], [name] [name]  dofetilide (TIKOSYN) 250 MCG capsule TAKE 1 CAPSULE(250 MCG) BY MOUTH TWICE DAILY Patient taking differently: Take 250 mcg by mouth 2 (two) times daily.  01/24/18   Bensimhon, Shaune Pascal, [name] [name]  levalbuterol [facility] HFA) 45 MCG/ACT inhaler Inhale 1 puff into the lungs every 4 (four) hours as needed for wheezing or shortness of breath.    Provider, Historical, [name] [name]  LORazepam (ATIVAN) 1 MG tablet TAKE 1/2 TO 1 TABLET BY MOUTH EVERY NIGHT AT BEDTIME AS NEEDED 01/18/18   Burchette, Alinda Sierras, [name] [name]  mometasone (NASONEX) 50 MCG/ACT nasal spray Place 1 spray into the nose daily as needed (for allergies).     Provider, Historical, [name] [name]  nitroGLYCERIN (NITROSTAT) 0.4 MG SL tablet Place 1 tablet (0.4 mg total) under the tongue every 5 (five) minutes x 3 doses as needed for chest pain. 01/04/17   [name], [name] [name]  omeprazole (PRILOSEC OTC) 20 MG tablet Take 1 tablet (20 mg total) by mouth daily. 09/17/13   [name], [name] [name]  oxyCODONE (ROXICODONE) 5 MG immediate release tablet Take 1 tablet (5 mg total) by mouth every 4 (four) hours as needed for severe pain. 06/10/17   Franchot Gallo, [name] [name]  tamsulosin (FLOMAX) 0.4 MG CAPS capsule Take 1 capsule (0.4 mg total) by mouth daily. 11/21/17   [name],  [name] [name]  traMADol (ULTRAM) 50 MG tablet Take 1 tablet (50 mg total) by mouth every 12 (twelve) hours as needed. 11/21/17   [name], [name] [name]    Family History Family History  Problem Relation Age of Onset  . Coronary artery disease Mother        deceased  . Deep vein thrombosis Sister   . Coronary artery disease Brother   . Asthma Brother   . Colon cancer Neg Hx     Social History Social History   Tobacco Use  . Smoking status: Never Smoker  . Smokeless tobacco: Never Used  Substance Use Topics  . Alcohol use: No  . Drug use: No     Allergies   Advil [ibuprofen]; Asa [aspirin]; and Tylenol [acetaminophen]   Review of Systems Review of Systems  Respiratory: Positive for  shortness of breath.   All other systems reviewed and are negative.    Physical Exam Updated Vital Signs BP (!) 174/121 (BP Location: Left Arm)   Pulse (!) 126   Temp (!) 103.6 F (39.8 C) (Rectal)   Resp (!) 26   Ht 5' 10.5" (1.791 m)   Wt 70.8 kg   SpO2 97%   BMI 22.07 kg/m   Physical Exam Vitals signs and nursing note reviewed.  Constitutional:      Appearance: He is well-developed. He is ill-appearing.  HENT:     Head: Normocephalic and atraumatic.  Cardiovascular:     Rate and Rhythm: Regular rhythm.     Heart sounds: No murmur.     Comments: Tachycardic Pulmonary:     Comments: Tachypnea. Decreased air movement in left lung fields. Rhonchi and right lung fields Abdominal:     Palpations: Abdomen is soft.     Tenderness: There is no abdominal tenderness. There is no guarding or rebound.  Musculoskeletal:        General: No swelling or tenderness.  Skin:    General: Skin is warm and dry.  Neurological:     Mental Status: He is alert and oriented to person, place, and time.  Psychiatric:        Mood and Affect: Mood normal.        Behavior: Behavior normal.      ED Treatments / Results  Labs (all labs ordered are listed, but only abnormal results are displayed) Labs  Reviewed  URINALYSIS, ROUTINE W REFLEX MICROSCOPIC - Abnormal; Notable for the following components:      Result Value   Hgb urine dipstick SMALL (*)    All other components within normal limits  I-STAT CG4 LACTIC ACID, ED - Abnormal; Notable for the following components:   Lactic Acid, Venous 3.84 (*)    All other components within normal limits  CULTURE, BLOOD (ROUTINE X 2)  CULTURE, BLOOD (ROUTINE X 2)  CBC WITH DIFFERENTIAL/PLATELET  PROTIME-INR  COMPREHENSIVE METABOLIC PANEL  INFLUENZA PANEL BY PCR (TYPE A & B)  I-STAT TROPONIN, ED    EKG EKG Interpretation  Date/Time:  Saturday February 25 2018 06:42:55 EST Ventricular Rate:  127 PR Interval:    QRS Duration: 81 QT Interval:  363 QTC Calculation: 528 R Axis:   57 Text Interpretation:  Atrial fibrillation Borderline T abnormalities, inferior leads Prolonged QT interval Interpretation limited secondary to artifact Confirmed by Quintella Reichert [phone]) on 02/25/2018 7:00:23 AM   Radiology Dg Chest 2 View  Result Date: 02/25/2018 CLINICAL DATA:  Shortness of breath EXAM: CHEST - 2 VIEW COMPARISON:  April 02, 2016 FINDINGS: There is extensive airspace consolidation throughout much of the left lower lobe. Lungs elsewhere are clear. Heart size and pulmonary vascularity are normal. No adenopathy. There is aortic atherosclerosis. No evident bone lesions. There are surgical clips at the gastroesophageal junction. IMPRESSION: Extensive left lower lobe pneumonia. Lungs elsewhere clear. Heart size normal. There is aortic atherosclerosis. Aortic Atherosclerosis (ICD10-I70.0). Followup PA and lateral chest radiographs recommended in 3-4 weeks following trial of antibiotic therapy to ensure resolution and exclude underlying malignancy. Electronically Signed   By: Lowella Grip III M.D.   On: 02/25/2018 07:20    Procedures Procedures (including critical care time) CRITICAL CARE Performed by: Quintella Reichert   Total critical care  time: 35 minutes  Critical care time was exclusive of separately billable procedures and treating other patients.  Critical care was necessary to treat or prevent  imminent or life-threatening deterioration.  Critical care was time spent personally by me on the following activities: development of treatment plan with patient and/or surrogate as well as nursing, discussions with consultants, evaluation of patient's response to treatment, examination of patient, obtaining history from patient or surrogate, ordering and performing treatments and interventions, ordering and review of laboratory studies, ordering and review of radiographic studies, pulse oximetry and re-evaluation of patient's condition.  Medications Ordered in ED Medications  acetaminophen (TYLENOL) tablet 650 mg (has no administration in time range)  sodium chloride 0.9 % bolus 500 mL (has no administration in time range)  cefTRIAXone (ROCEPHIN) 2 g in sodium chloride 0.9 % 100 mL IVPB (has no administration in time range)  azithromycin (ZITHROMAX) 500 mg in sodium chloride 0.9 % 250 mL IVPB (has no administration in time range)     Initial Impression / Assessment and Plan / ED Course  I have reviewed the triage vital signs and the nursing notes.  Pertinent labs & imaging results that were available during my care of the patient were reviewed by me and considered in my medical decision making (see chart for details).     Pt with history of atrial fibrillation here for evaluation of cough and shortness of breath. He is ill appearing on evaluation with fever, tachycardia, tachypnea. He was treated with antibiotics for community acquired pneumonia as well as IV fluids. He has an allergy to acetaminophen and NSAIDs, therefore fever could not be treated with antipyretic. Initial lactate markedly elevated, improved after IV fluid resuscitation. He does have a new oxygen requirement of 2 to 3 L. Patient updated of findings of studies in  medicine consulted for admission for pneumonia with sepsis.  Final Clinical Impressions(s) / ED Diagnoses   Final diagnoses:  None    ED Discharge Orders    None       Quintella Reichert, [name] [name] 02/25/18 1046

## 2018-02-25 NOTE — ED Triage Notes (Addendum)
Patient c/o SOB and cough x 3 days. 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 Is not currently on abx. EMS gave 1 albuterol tx and 1 duoneb.

## 2018-02-25 NOTE — ED Notes (Signed)
Delay in 2nd set of blood cultures,  Pt receiving peri care.

## 2018-02-25 NOTE — H&P (Addendum)
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  Patient reports nasal congestion, sore throat and cough which started last Monday and progressively worsened to shortness of breath/wheezing.  This morning  he was extremely short winded after using the bathroom and was brought in by family for further evaluation and management.  According to wife (who is a retired [employer] scientist), patient does not like to go to the hospital and was trying to self manage at home.  Patient states he felt his asthma was flaring up.  Both patient and wife deny history of fevers at home.  Patient has chronic reflux and always has head and of the bed elevated to 45 degrees.  He denies any leg swellings and does not use diuretics on a daily basis although he has Lasix 20 mg available to use as needed for history of diastolic CHF.   On presentation to the ED he was noted to have rectal temp of 623.7, systolic blood pressure in low 100s and labs showing lactate of 3.84 which improved to 1.9 after 500 mL of fluids.  Chest x-ray suggestive of left lower lobe infiltrate.  UA negative.  Patient reports intolerance/allergy to aspirin, acetaminophen and ibuprofen.  Hence he did not receive any treatment for fever and repeat oral temperature bedside during my evaluation was 97.5 F.  He is requiring 2 L of nasal cannula to keep sats greater than 92%.  Review of Systems: As per HPI otherwise 10 point review of systems negative.    Past Medical History:  Diagnosis Date  . Allergic rhinitis   . Anxiety   . Asthma   . Atrial  fibrillation ([place])   . CAD (coronary artery disease)   . Depression   . Diverticulosis 03/16/1995  . DJD (degenerative joint disease)    "most joints" (03/11/2015)  . GERD (gastroesophageal reflux disease) 12/30/2000  . Heart attack ([place]) 1984  . Hiatal hernia 12/30/2000  . History of blood transfusion 1988   "related to GI bleeding OR"  . History of duodenal ulcer 10/15/1986  . Hyperlipidemia   . Hypertension   . Sinus bradycardia   . Stroke [facility])     Past Surgical History:  Procedure Laterality Date  . CARDIAC CATHETERIZATION    . CATARACT EXTRACTION W/ INTRAOCULAR LENS  IMPLANT, BILATERAL Bilateral ?2013  . CHOLECYSTECTOMY OPEN  1989  . CORONARY ANGIOPLASTY WITH STENT PLACEMENT  2010  . hospital ccu  1984   heart attack, hypoplastic right coronary  . INGUINAL HERNIA REPAIR Right ~ 1990  . NASAL SEPTUM SURGERY  1972  . ORCHIECTOMY Bilateral 06/10/2017   Procedure: BILATERAL ORCHIECTOMY;  Surgeon: Franchot Gallo, MD;  Location: [facility] ORS;  Service: Urology;  Laterality: Bilateral;  MAC ANESTHESIA AND LOCAL  . TONSILLECTOMY    . TOTAL KNEE ARTHROPLASTY Bilateral 1998-2004   "right-left"  . VAGOTOMY AND PYLOROPLASTY  1988   "bleeding duodenal ulcer"    Social history:  reports that he has never smoked. He has never used smokeless tobacco. He reports that he does not drink alcohol or use drugs.   Allergies  Allergen  Reactions  . Tylenol [Acetaminophen] Rash  . Advil [Ibuprofen] Nausea Only and Rash  . Asa [Aspirin] Rash    Family History  Problem Relation Age of Onset  . Coronary artery disease Mother        deceased  . Deep vein thrombosis Sister   . Coronary artery disease Brother   . Asthma Brother   . Colon cancer Neg Hx       Prior to Admission medications   Medication Sig Start Date End Date Taking? Authorizing Provider  albuterol (PROVENTIL HFA;VENTOLIN HFA) 108 (90 Base) MCG/ACT inhaler Inhale 1-2 puffs into the lungs every 6 (six) hours as needed for  wheezing or shortness of breath.   Yes [provider]  apixaban (ELIQUIS) 5 MG TABS tablet Take 1 tablet (5 mg total) by mouth 2 (two) times daily. 11/10/17  Yes Larey Dresser, MD  ARNUITY ELLIPTA 200 MCG/ACT AEPB INHALE 1 PUFF INTO THE LUNGS DAILY Patient taking differently: Inhale 1 puff into the lungs daily.  11/29/17  Yes Brand Males, MD  atenolol (TENORMIN) 25 MG tablet Take 1 tablet (25 mg total) by mouth daily. 09/26/17  Yes Larey Dresser, MD  atorvastatin (LIPITOR) 20 MG tablet TAKE 1 TABLET(20 MG) BY MOUTH DAILY Patient taking differently: Take 20 mg by mouth daily.  11/21/17  Yes [name], MD  buPROPion [facility] SR) 150 MG 12 hr tablet Take 1 tablet (150 mg total) by mouth daily. 11/21/17  Yes [name], MD  cetirizine (ZYRTEC) 10 MG tablet Take 10 mg by mouth daily with supper.    Yes [provider]  Cholecalciferol (VITAMIN D) 2000 units tablet Take 2,000 Units by mouth daily.   Yes [provider]  diltiazem (CARDIZEM) 30 MG tablet Take 1 tablet every 4 hours AS NEEDED for afib heart rate >100 11/21/17  Yes [name], MD  dofetilide (TIKOSYN) 250 MCG capsule TAKE 1 CAPSULE(250 MCG) BY MOUTH TWICE DAILY Patient taking differently: Take 250 mcg by mouth 2 (two) times daily.  01/24/18  Yes Bensimhon, Shaune Pascal, MD  levalbuterol [facility] HFA) 45 MCG/ACT inhaler Inhale 1 puff into the lungs every 4 (four) hours as needed for wheezing or shortness of breath.   Yes [provider]  LORazepam (ATIVAN) 1 MG tablet TAKE 1/2 TO 1 TABLET BY MOUTH EVERY NIGHT AT BEDTIME AS NEEDED Patient taking differently: Take 0.5 mg by mouth at bedtime.  01/18/18  Yes Burchette, Alinda Sierras, MD  mometasone (NASONEX) 50 MCG/ACT nasal spray Place 1 spray into the nose daily as needed (for allergies).    Yes [provider]  nitroGLYCERIN (NITROSTAT) 0.4 MG SL tablet Place 1 tablet (0.4 mg total) under the tongue every 5 (five) minutes x 3  doses as needed for chest pain. 01/04/17  Yes [name], MD  omeprazole (PRILOSEC OTC) 20 MG tablet Take 1 tablet (20 mg total) by mouth daily. 09/17/13  Yes [name], MD  tamsulosin (FLOMAX) 0.4 MG CAPS capsule Take 1 capsule (0.4 mg total) by mouth daily. 11/21/17  Yes [name], MD  traMADol (ULTRAM) 50 MG tablet Take 1 tablet (50 mg total) by mouth every 12 (twelve) hours as needed. Patient taking differently: Take 50 mg by mouth every 12 (twelve) hours as needed for moderate pain.  11/21/17  Yes [name], MD  oxyCODONE (ROXICODONE) 5 MG immediate release tablet Take 1 tablet (5 mg total) by mouth every 4 (four) hours as needed  for severe pain. Patient not taking: Reported on 02/25/2018 06/10/17   Franchot Gallo, MD    Physical Exam: Vitals:   02/25/18 0815 02/25/18 0830 02/25/18 0900 02/25/18 1030  BP:  (!) 121/56 (!) 108/47 (!) 106/52  Pulse: (!) 116 (!) 110 (!) 132 (!) 110  Resp: (!) 25 (!) 21 (!) 24 17  Temp:      TempSrc:      SpO2: 91% 90% 90% 93%  Weight:      Height:        Constitutional: NAD, calm, comfortable Eyes: PERRL, lids and conjunctivae normal ENMT: Mucous membranes are moist. Posterior pharynx clear of any exudate or lesions.Normal dentition.  Neck: normal, supple, no masses, no thyromegaly Respiratory: Decreased breath sounds along bilateral bases and about half of left side, with scattered wheezing, no crackles. Normal respiratory effort. No accessory muscle use.  Cardiovascular: Regular rate and rhythm, no murmurs / rubs / gallops. No extremity edema. 2+ pedal pulses. No carotid bruits.  Abdomen: no tenderness, no masses palpated. No hepatosplenomegaly. Bowel sounds positive.  Musculoskeletal: no clubbing / cyanosis. No joint deformity upper and lower extremities. Good ROM, no contractures. Normal muscle tone.  Neurologic: CN 2-12 grossly intact. Sensation intact, DTR normal. Strength 5/5 in all 4.  Psychiatric: Normal judgment and  insight. Alert and oriented x 3. Normal mood.  SKIN/catheters: no rashes, lesions, ulcers. No induration  Labs on Admission: I have personally reviewed following labs and imaging studies  CBC: Recent Labs  Lab 02/25/18 0645  WBC 6.7  NEUTROABS 5.6  HGB 13.9  HCT 45.3  MCV 96.4  PLT 767   Basic Metabolic Panel: Recent Labs  Lab 02/25/18 0645  NA 138  K 4.5  CL 102  CO2 23  GLUCOSE 148*  BUN 25*  CREATININE 1.27*  CALCIUM 8.9   GFR: Estimated Creatinine Clearance: 41.8 mL/min (A) (by C-G formula based on SCr of 1.27 mg/dL (H)). Liver Function Tests: Recent Labs  Lab 02/25/18 0645  AST 24  ALT 21  ALKPHOS 91  BILITOT 0.6  PROT 6.6  ALBUMIN 3.5   No results for input(s): LIPASE, AMYLASE in the last 168 hours. No results for input(s): AMMONIA in the last 168 hours. Coagulation Profile: Recent Labs  Lab 02/25/18 0645  INR 1.16   Cardiac Enzymes: No results for input(s): CKTOTAL, CKMB, CKMBINDEX, TROPONINI in the last 168 hours. BNP (last 3 results) No results for input(s): PROBNP in the last 8760 hours. HbA1C: No results for input(s): HGBA1C in the last 72 hours. CBG: No results for input(s): GLUCAP in the last 168 hours. Lipid Profile: No results for input(s): CHOL, HDL, LDLCALC, TRIG, CHOLHDL, LDLDIRECT in the last 72 hours. Thyroid Function Tests: No results for input(s): TSH, T4TOTAL, FREET4, T3FREE, THYROIDAB in the last 72 hours. Anemia Panel: No results for input(s): VITAMINB12, FOLATE, FERRITIN, TIBC, IRON, RETICCTPCT in the last 72 hours. Urine analysis:    Component Value Date/Time   COLORURINE YELLOW 02/25/2018 0700   APPEARANCEUR CLEAR 02/25/2018 0700   LABSPEC 1.011 02/25/2018 0700   PHURINE 5.0 02/25/2018 0700   GLUCOSEU NEGATIVE 02/25/2018 0700   HGBUR SMALL (A) 02/25/2018 0700   HGBUR trace-lysed 07/18/2009 1022   BILIRUBINUR NEGATIVE 02/25/2018 0700   BILIRUBINUR N 01/04/2017 1524   KETONESUR NEGATIVE 02/25/2018 0700   PROTEINUR  NEGATIVE 02/25/2018 0700   UROBILINOGEN 0.2 01/04/2017 1524   UROBILINOGEN 0.2 07/18/2009 1022   NITRITE NEGATIVE 02/25/2018 0700   LEUKOCYTESUR NEGATIVE 02/25/2018 0700  Radiological Exams on Admission: Dg Chest 2 View  Result Date: 02/25/2018 CLINICAL DATA:  Shortness of breath EXAM: CHEST - 2 VIEW COMPARISON:  April 02, 2016 FINDINGS: There is extensive airspace consolidation throughout much of the left lower lobe. Lungs elsewhere are clear. Heart size and pulmonary vascularity are normal. No adenopathy. There is aortic atherosclerosis. No evident bone lesions. There are surgical clips at the gastroesophageal junction. IMPRESSION: Extensive left lower lobe pneumonia. Lungs elsewhere clear. Heart size normal. There is aortic atherosclerosis. Aortic Atherosclerosis (ICD10-I70.0). Followup PA and lateral chest radiographs recommended in 3-4 weeks following trial of antibiotic therapy to ensure resolution and exclude underlying malignancy. Electronically Signed   By: Lowella Grip III M.D.   On: 02/25/2018 07:20    EKG: Independently reviewed.  A. fib with QTC of 528 ms     Assessment and Plan:   1.  Left lower lobe pneumonia with sepsis present on admission: Patient received 1 dose of IV Rocephin and azithromycin in the ED.  Will continue Rocephin but substitute azithromycin with doxycycline given concerns for prolonged QTC.  Send blood/sputum for cultures.  Neb treatments scheduled and PRN.  Will also treat with additional IV fluids as patient appears dry.  Lactate appears to be downtrending.  Patient had isolated rectal temp of 103.6 recorded on presentation.  Repeat temp bedside appears to be within normal limits (without any treatment).  Patient intolerant to NSAIDs and acetaminophen.  Will utilize tramadol as needed fevers. Influenza test pending.  2.  Paroxysmal atrial fibrillation: Hold Tikosyn given concerns for prolonged QTC.  May resume if EKG in a.m. shows improvement.   Resume beta-blockers with holding parameters.  Patient also on diltiazem as needed at home.  3.  Chronic diastolic CHF: Appears compensated and dry right now.  Watch on IV hydration.  4.  CAD/hyperlipidemia: Resume beta-blockers and statins.  Patient intolerant to aspirin.  5.  GERD/hiatal hernia: Patient uses 45 degrees elevation of head end while sleeping.  Resume PPI  6.  Anxiety/depression: Resume Wellbutrin/Xanax  7.  Prolonged QTC: Patient was advised to hold Tikosyn if QTC greater than 500 per home medication list.  Will avoid QT prolonging agents and hold Tikosyn for now.  Repeat EKG in a.m.  IV magnesium ordered.  DVT prophylaxis: On Eliquis  Code Status: Full code  Family Communication: Discussed with patient and wife health care proxy would be his wife Consults called: Patient follows [facility] cardiology Dr. Aundra Dubin and [facility] pulmonology Dr. Chase Caller as outpatient.  I have not consulted them as of now Admission status:  Patient admitted as inpatient as anticipated LOS greater than 2 midnights    Guilford Shi MD [facility] Pager [phone]  02/25/2018, 11:01 AM   Addendum 3 pm: Called by nurse that patient's blood pressure is low again at systolic 46K to 59D, influenza B positive.  Start Tamiflu.  Also discussed with patient's primary pulmonologist Dr. Chase Caller.  Will give additional liter of bolus fluids and increase maintenance fluids to 100/h.  Added procalcitonin/urine Legionella/Streptococcus antigen per Dr. Chase Caller.  Discontinued beta-blockers for now.  Repeat temp check per nurse at 99.5 'F.  Low threshold to transfer to ICU if condition deteriorates further.

## 2018-02-26 DIAGNOSIS — J181 Lobar pneumonia, unspecified organism: Secondary | ICD-10-CM

## 2018-02-26 DIAGNOSIS — J101 Influenza due to other identified influenza virus with other respiratory manifestations: Secondary | ICD-10-CM | POA: Diagnosis present

## 2018-02-26 LAB — BLOOD CULTURE ID PANEL (REFLEXED)
Acinetobacter baumannii: NOT DETECTED
Candida albicans: NOT DETECTED
Candida glabrata: NOT DETECTED
Candida krusei: NOT DETECTED
Candida parapsilosis: NOT DETECTED
Candida tropicalis: NOT DETECTED
Enterobacter cloacae complex: NOT DETECTED
Enterobacteriaceae species: NOT DETECTED
Enterococcus species: NOT DETECTED
Escherichia coli: NOT DETECTED
Haemophilus influenzae: NOT DETECTED
Klebsiella oxytoca: NOT DETECTED
Klebsiella pneumoniae: NOT DETECTED
Listeria monocytogenes: NOT DETECTED
Neisseria meningitidis: NOT DETECTED
PROTEUS SPECIES: NOT DETECTED
Pseudomonas aeruginosa: NOT DETECTED
STAPHYLOCOCCUS SPECIES: NOT DETECTED
Serratia marcescens: NOT DETECTED
Staphylococcus aureus (BCID): NOT DETECTED
Streptococcus agalactiae: NOT DETECTED
Streptococcus pneumoniae: NOT DETECTED
Streptococcus pyogenes: NOT DETECTED
Streptococcus species: NOT DETECTED

## 2018-02-26 LAB — BASIC METABOLIC PANEL
Anion gap: 10 (ref 5–15)
BUN: 21 mg/dL (ref 8–23)
CALCIUM: 7.7 mg/dL — AB (ref 8.9–10.3)
CO2: 20 mmol/L — AB (ref 22–32)
CREATININE: 1.07 mg/dL (ref 0.61–1.24)
Chloride: 105 mmol/L (ref 98–111)
GFR calc Af Amer: >60 mL/min (ref >60)
GFR calc non Af Amer: >60 mL/min (ref >60)
Glucose, Bld: 115 mg/dL — ABNORMAL HIGH (ref 70–99)
Potassium: 3.8 mmol/L (ref 3.5–5.1)
Sodium: 135 mmol/L (ref 135–145)

## 2018-02-26 LAB — PROCALCITONIN: Procalcitonin: 36.95 ng/mL

## 2018-02-26 LAB — CBC
HCT: 33.1 % — ABNORMAL LOW (ref 39.0–52.0)
Hemoglobin: 11 g/dL — ABNORMAL LOW (ref 13.0–17.0)
MCH: 30.5 pg (ref 26.0–34.0)
MCHC: 33.2 g/dL (ref 30.0–36.0)
MCV: 91.7 fL (ref 80.0–100.0)
Platelets: 114 10*3/uL — ABNORMAL LOW (ref 150–400)
RBC: 3.61 MIL/uL — ABNORMAL LOW (ref 4.22–5.81)
RDW: 13.5 % (ref 11.5–15.5)
WBC: 16 10*3/uL — ABNORMAL HIGH (ref 4.0–10.5)
nRBC: 0 % (ref 0.0–0.2)

## 2018-02-26 LAB — LEGIONELLA PNEUMOPHILA SEROGP 1 UR AG: L. pneumophila Serogp 1 Ur Ag: NEGATIVE

## 2018-02-26 LAB — MAGNESIUM: Magnesium: 1.8 mg/dL (ref 1.7–2.4)

## 2018-02-26 MED ORDER — IPRATROPIUM BROMIDE 0.02 % IN SOLN
0.5000 mg | Freq: Four times a day (QID) | RESPIRATORY_TRACT | Status: DC
  Administered 2018-02-26 (×2): 0.5 mg via RESPIRATORY_TRACT
  Filled 2018-02-26 (×3): qty 2.5

## 2018-02-26 MED ORDER — LEVALBUTEROL HCL 0.63 MG/3ML IN NEBU
0.6300 mg | INHALATION_SOLUTION | RESPIRATORY_TRACT | Status: DC | PRN
  Filled 2018-02-26: qty 3

## 2018-02-26 MED ORDER — DOFETILIDE 250 MCG PO CAPS
250.0000 ug | ORAL_CAPSULE | Freq: Two times a day (BID) | ORAL | Status: DC
  Administered 2018-02-26 – 2018-02-27 (×3): 250 ug via ORAL
  Filled 2018-02-26 (×3): qty 1

## 2018-02-26 MED ORDER — DOFETILIDE 250 MCG PO CAPS: 250.0000 ug | ORAL_CAPSULE | Freq: Two times a day (BID) | ORAL | Status: DC

## 2018-02-26 MED ORDER — OSELTAMIVIR PHOSPHATE 30 MG PO CAPS
30.0000 mg | ORAL_CAPSULE | Freq: Two times a day (BID) | ORAL | Status: AC
  Administered 2018-02-26 – 2018-03-01 (×8): 30 mg via ORAL
  Filled 2018-02-26 (×8): qty 1

## 2018-02-26 MED ORDER — IPRATROPIUM-ALBUTEROL 0.5-2.5 (3) MG/3ML IN SOLN: 3.0000 mL | Freq: Four times a day (QID) | RESPIRATORY_TRACT | Status: DC

## 2018-02-26 MED ORDER — LEVALBUTEROL HCL 0.63 MG/3ML IN NEBU
0.6300 mg | INHALATION_SOLUTION | Freq: Four times a day (QID) | RESPIRATORY_TRACT | Status: DC
  Administered 2018-02-26 (×2): 0.63 mg via RESPIRATORY_TRACT
  Filled 2018-02-26 (×3): qty 3

## 2018-02-26 NOTE — Progress Notes (Signed)
PHARMACY NOTE:  ANTIMICROBIAL RENAL DOSAGE ADJUSTMENT  Current antimicrobial regimen includes a mismatch between antimicrobial dosage and estimated renal function.  As per policy approved by the Pharmacy & Therapeutics and Medical Executive Committees, the antimicrobial dosage will be adjusted accordingly.  Current antimicrobial dosage:  Tamiflu 75 mg BID  Indication: Influenza B +  Renal Function:  Estimated Creatinine Clearance: 49.6 mL/min (by C-G formula based on SCr of 1.07 mg/dL).     Antimicrobial dosage has been changed to:  Tamiflu 30 mg BID (Crcl range for this dose in 30-60 mL/min)  Thank you for allowing pharmacy to be a part of this patient's care.  Narda Bonds, [facility] 02/26/2018 6:46 AM

## 2018-02-26 NOTE — Plan of Care (Signed)

## 2018-02-26 NOTE — Progress Notes (Signed)
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 He was started on doxy and ceftriaxone for possible concomitant PNA. Labs called with BCID result today. 1/4 bottles showed GPC but nothing on BCID. It's probably an organism not on the panel. Pt has become afebrile.   Name of physician (or Provider) Contacted: Dr. Tana Coast  Current antibiotics: Tamiflu, doxycycline, ceftriaxone  Changes to prescribed antibiotics recommended:  Cont current abx  Results for orders placed or performed during the hospital encounter of 02/25/18  Blood Culture ID Panel (Reflexed) (Collected: 02/25/2018  6:45 AM)  Result Value Ref Range   Enterococcus species NOT DETECTED NOT DETECTED   Listeria monocytogenes NOT DETECTED NOT DETECTED   Staphylococcus species NOT DETECTED NOT DETECTED   Staphylococcus aureus (BCID) NOT DETECTED NOT DETECTED   Streptococcus species NOT DETECTED NOT DETECTED   Streptococcus agalactiae NOT DETECTED NOT DETECTED   Streptococcus pneumoniae NOT DETECTED NOT DETECTED   Streptococcus pyogenes NOT DETECTED NOT DETECTED   Acinetobacter baumannii NOT DETECTED NOT DETECTED   Enterobacteriaceae species NOT DETECTED NOT DETECTED   Enterobacter cloacae complex NOT DETECTED NOT DETECTED   Escherichia coli NOT DETECTED NOT DETECTED   Klebsiella oxytoca NOT DETECTED NOT DETECTED   Klebsiella pneumoniae NOT DETECTED NOT DETECTED   Proteus species NOT DETECTED NOT DETECTED   Serratia marcescens NOT DETECTED NOT DETECTED   Haemophilus influenzae NOT DETECTED NOT DETECTED   Neisseria meningitidis NOT DETECTED NOT DETECTED   Pseudomonas aeruginosa NOT DETECTED NOT DETECTED   Candida albicans NOT DETECTED NOT DETECTED   Candida glabrata NOT DETECTED NOT DETECTED   Candida krusei NOT DETECTED NOT  DETECTED   Candida parapsilosis NOT DETECTED NOT DETECTED   Candida tropicalis NOT DETECTED NOT DETECTED    Onnie Boer, PharmD, BCIDP, AAHIVP, CPP Infectious Disease Pharmacist 02/26/2018 6:08 PM

## 2018-02-26 NOTE — Plan of Care (Signed)
  Problem: Clinical Measurements: Goal: Ability to maintain clinical measurements within normal limits will improve Outcome: Progressing   Problem: Clinical Measurements: Goal: Respiratory complications will improve Outcome: Progressing   

## 2018-02-26 NOTE — Progress Notes (Signed)
         [facility]                                                                              Patient Demographics  [name] [name], is a 85 y.o. male, DOB - 04/17/1929, MRN:[id number]  Admit date - 02/25/2018   Admitting Physician Neelima Kamineni, MD  Outpatient Primary MD for the patient is Todd, Jeffrey A, MD  Outpatient specialists:   LOS - 1  days   Medical records reviewed and are as summarized below:    Chief Complaint  Patient presents with  . 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 fib on Tikosyn and Eliquis, CAD, CVA, diastolic CHF, GERD, asthma presented with worsening cough and shortness of breath.  Per patient started with nasal congestion, sore throat and cough and progressively worsened to the point that he was extremely winded after using the bathroom. In the ED, patient was found to have temp of 103.6, BP in low 100s, lactate 3.84, chest x-ray suggestive of left lower lobe infiltrates.  Influenza panel positive for influenza B.   Assessment & Plan    Principal Problem:   Sepsis (HCC) due to left lower lobe pneumonia and influenza B -Patient met sepsis criteria at the time of admission with fevers, lactic acidosis, hypotension, tachycardia, source secondary to left lung pneumonia and influenza B -Follow blood cultures, sputum cultures, urine strep antigen negative -Still has wheezing and rhonchi bilaterally, worse left than right -Placed on scheduled nebs, flutter valve, continue Breo -Continue IV Rocephin and doxycycline, on Tamiflu -Due to tachycardia, will change scheduled nebs to Xopenex and Atrovent  Active Problems: Influenza B -Influenza panel positive for influenza B, patient started on Tamiflu on 1/18  Persistent atrial fibrillation -Admission EKG showed prolonged QTC, 528.  Tikosyn was held overnight.  Zithromax was  discontinued -Repeat EKG this morning shows improved QTC to 443, Tikosyn restarted. -Rate controlled, continue apixaban    Chronic diastolic heart failure (HCC) -Strict I's and O's and daily weights, net +1.3 L -Encourage p.o. diet and hydration, DC IV fluids     Hyperlipidemia Continue Lipitor 20 mg daily    Essential hypertension -BP currently soft but stable, hold antihypertensives    CAD S/P OM PCI 2010 -Currently no chest pain, continue Tikosyn, statin, Eliquis    GERD -Continue PPI  asthma exacerbation -Currently wheezing and rhonchorous likely from pneumonia, influenza, asthma exacerbation -Continue management as per #1  Anxiety Continue Wellbutrin   Code Status: Full CODE STATUS DVT Prophylaxis: Apixaban Family Communication: Discussed in detail with the patient, all imaging results, lab results explained to the patient   Disposition Plan: Still wheezing and rhonchorous, continue current management. DC likely in next 24 to 48 hours, afebrile, coughing and respiratory status improves.  Time Spent in minutes 35 minutes  Procedures:  None  Consultants:   None  Antimicrobials:   Anti-infectives (From admission, onward)   Start     Dose/Rate Route Frequency Ordered Stop   02/26/18 1000  oseltamivir (TAMIFLU) capsule 30 mg       30 mg Oral 2 times daily 02/26/18 0640 03/02/18 0959   02/26/18 0000  cefTRIAXone (ROCEPHIN) 1 g in sodium chloride 0.9 % 100 mL IVPB  Status:  Discontinued     1 g 200 mL/hr over 30 Minutes Intravenous Every 24 hours 02/25/18 1056 02/25/18 1214   02/25/18 1530  oseltamivir (TAMIFLU) capsule 30 mg  Status:  Discontinued     30 mg Oral 2 times daily 02/25/18 1520 02/26/18 0640   02/25/18 1430  doxycycline (VIBRA-TABS) tablet 100 mg     100 mg Oral Every 12 hours 02/25/18 1417     02/25/18 1230  doxycycline (VIBRA-TABS) tablet 100 mg  Status:  Discontinued     100 mg Oral Every 12 hours 02/25/18 1056 02/25/18 1417   02/25/18 0745   cefTRIAXone (ROCEPHIN) 2 g in sodium chloride 0.9 % 100 mL IVPB     2 g 200 mL/hr over 30 Minutes Intravenous Every 24 hours 02/25/18 0733     02/25/18 0745  azithromycin (ZITHROMAX) 500 mg in sodium chloride 0.9 % 250 mL IVPB  Status:  Discontinued     500 mg 250 mL/hr over 60 Minutes Intravenous Every 24 hours 02/25/18 0733 02/25/18 1026         Medications  Scheduled Meds: . apixaban  5 mg Oral BID  . atorvastatin  20 mg Oral Daily  . buPROPion  150 mg Oral Daily  . cholecalciferol  2,000 Units Oral Daily  . dofetilide  250 mcg Oral BID  . doxycycline  100 mg Oral Q12H  . fluticasone  1 spray Each Nare Daily  . fluticasone furoate-vilanterol  1 puff Inhalation Daily  . ipratropium-albuterol  3 mL Nebulization TID  . loratadine  10 mg Oral Daily  . metoprolol tartrate  12.5 mg Oral BID  . oseltamivir  30 mg Oral BID  . pantoprazole  20 mg Oral Daily  . tamsulosin  0.4 mg Oral Daily   Continuous Infusions: . sodium chloride 100 mL/hr at 02/26/18 0600  . cefTRIAXone (ROCEPHIN)  IV Stopped (02/25/18 0858)  . sodium chloride     PRN Meds:.albuterol, LORazepam, nitroGLYCERIN, ondansetron **OR** ondansetron (ZOFRAN) IV, oxyCODONE, polyethylene glycol, traMADol      Subjective:   [name] [name] was seen and examined today.  Feels slightly better from last night although still wheezing and rhonchorous, on O2 2 L. Patient denies dizziness, abdominal pain, N/V/D/C, new weakness, numbess, tingling.  Currently afebrile, still somewhat tachycardiac  Objective:   Vitals:   02/26/18 0600 02/26/18 0630 02/26/18 0915 02/26/18 0918  BP: (!) 102/59 110/65 (!) 114/94   Pulse: (!) 102 100 100   Resp: _0 Temp:      TempSrc:      SpO2: 97% 97% 98% 98%  Weight:      Height:        Intake/Output Summary (Last 24 hours) at 02/26/2018 1026 Last data filed at 02/26/2018 0600 Gross per 24 hour  Intake 1699.73 ml  Output 350 ml  Net 1349.73 ml     Wt Readings from Last 3  Encounters:  02/25/18 70.8 kg  12/22/17 73.5 kg  10/31/17 70.3 kg     Exam  General: Alert and oriented x 3, NAD  Eyes:   HEENT:   Cardiovascular: S1 S2 auscultated, tachycardia, RRR  Respiratory:  bilateral expiratory wheezing and rhonchi left > right  Gastrointestinal: Soft, nontender, nondistended, + bowel sounds  Ext: no pedal edema bilaterally  Neuro: No new deficits  Musculoskeletal: No digital cyanosis, clubbing  Skin: No rashes  Psych: Normal affect and demeanor, alert and oriented x3    Data Reviewed:  I have personally reviewed following labs and imaging studies  Micro Results Recent Results (from the past 240 hour(s))  MRSA PCR Screening     Status: None   Collection Time: 02/25/18 12:01 PM  Result Value Ref Range Status   MRSA by PCR NEGATIVE NEGATIVE Final    Comment:        The GeneXpert MRSA Assay (FDA approved for NASAL specimens only), is one component of a comprehensive MRSA colonization surveillance program. It is not intended to diagnose MRSA infection nor to guide or monitor treatment for MRSA infections. Performed at [facility], [address] [place], [place] [address]   Expectorated sputum assessment w rflx to resp cult     Status: None   Collection Time: 02/25/18  5:23 PM  Result Value Ref Range Status   Specimen Description SPUTUM  Final   Special Requests NONE  Final   Sputum evaluation   Final    THIS SPECIMEN IS ACCEPTABLE FOR SPUTUM CULTURE Performed at [facility], [address] [place], [place] [address]    Report Status 02/25/2018 FINAL  Final  Culture, respiratory     Status: None (Preliminary result)   Collection Time: 02/25/18  5:23 PM  Result Value Ref Range Status   Specimen Description SPUTUM  Final   Special Requests NONE Reflexed from [id number]  Final   Gram Stain   Final    MODERATE WBC PRESENT, PREDOMINANTLY PMN RARE SQUAMOUS EPITHELIAL CELLS PRESENT RARE GRAM POSITIVE COCCI RARE GRAM  POSITIVE RODS Performed at [facility], [address] , [place] [address]    Culture PENDING  Incomplete   Report Status PENDING  Incomplete    Radiology Reports Dg Chest 2 View  Result Date: 02/25/2018 CLINICAL DATA:  Shortness of breath EXAM: CHEST - 2 VIEW COMPARISON:  April 02, 2016 FINDINGS: There is extensive airspace consolidation throughout much of the left lower lobe. Lungs elsewhere are clear. Heart size and pulmonary vascularity are normal. No adenopathy. There is aortic atherosclerosis. No evident bone lesions. There are surgical clips at the gastroesophageal junction. IMPRESSION: Extensive left lower lobe pneumonia. Lungs elsewhere clear. Heart size normal. There is aortic atherosclerosis. Aortic Atherosclerosis (ICD10-I70.0). Followup PA and lateral chest radiographs recommended in 3-4 weeks following trial of antibiotic therapy to ensure resolution and exclude underlying malignancy. Electronically Signed   By: William  Woodruff III M.D.   On: 02/25/2018 07:20    Lab Data:  CBC: Recent Labs  Lab 02/25/18 0645 02/26/18 0330  WBC 6.7 16.0*  NEUTROABS 5.6  --   HGB 13.9 11.0*  HCT 45.3 33.1*  MCV 96.4 91.7  PLT 163 114*   Basic Metabolic Panel: Recent Labs  Lab 02/25/18 0645 02/26/18 0330  NA 138 135  K 4.5 3.8  CL 102 105  CO2 23 20*  GLUCOSE 148* 115*  BUN 25* 21  CREATININE 1.27* 1.07  CALCIUM 8.9 7.7*  MG  --  1.8   GFR: Estimated Creatinine Clearance: 49.6 mL/min (by C-G formula based on SCr of 1.07 mg/dL). Liver Function Tests: Recent Labs  Lab 02/25/18 0645  AST 24  ALT 21  ALKPHOS 91  BILITOT 0.6  PROT 6.6  ALBUMIN 3.5   No results for input(s): LIPASE, AMYLASE in the last 168 hours. No results for input(s): AMMONIA in the   last 168 hours. Coagulation Profile: Recent Labs  Lab 02/25/18 0645  INR 1.16   Cardiac Enzymes: No results for input(s): CKTOTAL, CKMB, CKMBINDEX, TROPONINI in the last 168 hours. BNP (last 3  results) No results for input(s): PROBNP in the last 8760 hours. HbA1C: No results for input(s): HGBA1C in the last 72 hours. CBG: No results for input(s): GLUCAP in the last 168 hours. Lipid Profile: No results for input(s): CHOL, HDL, LDLCALC, TRIG, CHOLHDL, LDLDIRECT in the last 72 hours. Thyroid Function Tests: No results for input(s): TSH, T4TOTAL, FREET4, T3FREE, THYROIDAB in the last 72 hours. Anemia Panel: No results for input(s): VITAMINB12, FOLATE, FERRITIN, TIBC, IRON, RETICCTPCT in the last 72 hours. Urine analysis:    Component Value Date/Time   COLORURINE YELLOW 02/25/2018 0700   APPEARANCEUR CLEAR 02/25/2018 0700   LABSPEC 1.011 02/25/2018 0700   PHURINE 5.0 02/25/2018 0700   GLUCOSEU NEGATIVE 02/25/2018 0700   HGBUR SMALL (A) 02/25/2018 0700   HGBUR trace-lysed 07/18/2009 1022   BILIRUBINUR NEGATIVE 02/25/2018 0700   BILIRUBINUR N 01/04/2017 1524   KETONESUR NEGATIVE 02/25/2018 0700   PROTEINUR NEGATIVE 02/25/2018 0700   UROBILINOGEN 0.2 01/04/2017 1524   UROBILINOGEN 0.2 07/18/2009 1022   NITRITE NEGATIVE 02/25/2018 0700   LEUKOCYTESUR NEGATIVE 02/25/2018 0700     Ripudeep Rai M.D. [facility] 02/26/2018, 10:26 AM  Pager: [phone] Between 7am to 7pm - call Pager - [phone]  After 7pm go to [email or web address] - password [email or web address]  Call night coverage person covering after 7pm   

## 2018-02-27 DIAGNOSIS — R9431 Abnormal electrocardiogram [ECG] [EKG]: Secondary | ICD-10-CM

## 2018-02-27 DIAGNOSIS — I4819 Other persistent atrial fibrillation: Secondary | ICD-10-CM

## 2018-02-27 LAB — BASIC METABOLIC PANEL
Anion gap: 7 (ref 5–15)
BUN: 16 mg/dL (ref 8–23)
CO2: 21 mmol/L — ABNORMAL LOW (ref 22–32)
Calcium: 8.1 mg/dL — ABNORMAL LOW (ref 8.9–10.3)
Chloride: 104 mmol/L (ref 98–111)
Creatinine, Ser: 1.03 mg/dL (ref 0.61–1.24)
GFR calc Af Amer: >60 mL/min (ref >60)
GFR calc non Af Amer: >60 mL/min (ref >60)
Glucose, Bld: 103 mg/dL — ABNORMAL HIGH (ref 70–99)
Potassium: 4.1 mmol/L (ref 3.5–5.1)
Sodium: 132 mmol/L — ABNORMAL LOW (ref 135–145)

## 2018-02-27 LAB — PROCALCITONIN: Procalcitonin: 22.04 ng/mL

## 2018-02-27 LAB — CBC
HCT: 32.3 % — ABNORMAL LOW (ref 39.0–52.0)
Hemoglobin: 10.4 g/dL — ABNORMAL LOW (ref 13.0–17.0)
MCH: 29.5 pg (ref 26.0–34.0)
MCHC: 32.2 g/dL (ref 30.0–36.0)
MCV: 91.8 fL (ref 80.0–100.0)
Platelets: 108 10*3/uL — ABNORMAL LOW (ref 150–400)
RBC: 3.52 MIL/uL — ABNORMAL LOW (ref 4.22–5.81)
RDW: 13.4 % (ref 11.5–15.5)
WBC: 14 10*3/uL — ABNORMAL HIGH (ref 4.0–10.5)
nRBC: 0 % (ref 0.0–0.2)

## 2018-02-27 LAB — CULTURE, BLOOD (ROUTINE X 2)

## 2018-02-27 MED ORDER — DILTIAZEM HCL 25 MG/5ML IV SOLN
10.0000 mg | Freq: Once | INTRAVENOUS | Status: AC
  Administered 2018-02-27: 10 mg via INTRAVENOUS
  Filled 2018-02-27: qty 5

## 2018-02-27 MED ORDER — LEVALBUTEROL HCL 0.63 MG/3ML IN NEBU
0.6300 mg | INHALATION_SOLUTION | Freq: Three times a day (TID) | RESPIRATORY_TRACT | Status: DC
  Administered 2018-02-27: 0.63 mg via RESPIRATORY_TRACT
  Filled 2018-02-27: qty 3

## 2018-02-27 MED ORDER — GUAIFENESIN ER 600 MG PO TB12
600.0000 mg | ORAL_TABLET | Freq: Two times a day (BID) | ORAL | Status: DC
  Administered 2018-02-27 – 2018-03-03 (×9): 600 mg via ORAL
  Filled 2018-02-27 (×9): qty 1

## 2018-02-27 MED ORDER — BUDESONIDE 0.25 MG/2ML IN SUSP
0.2500 mg | Freq: Two times a day (BID) | RESPIRATORY_TRACT | Status: DC
  Administered 2018-02-27 – 2018-03-03 (×9): 0.25 mg via RESPIRATORY_TRACT
  Filled 2018-02-27 (×9): qty 2

## 2018-02-27 MED ORDER — METOPROLOL TARTRATE 25 MG PO TABS
25.0000 mg | ORAL_TABLET | Freq: Two times a day (BID) | ORAL | Status: DC
  Administered 2018-02-27 – 2018-03-03 (×7): 25 mg via ORAL
  Filled 2018-02-27 (×7): qty 1

## 2018-02-27 MED ORDER — BENZONATATE 100 MG PO CAPS
100.0000 mg | ORAL_CAPSULE | Freq: Three times a day (TID) | ORAL | Status: DC
  Administered 2018-02-27 – 2018-03-03 (×13): 100 mg via ORAL
  Filled 2018-02-27 (×13): qty 1

## 2018-02-27 MED ORDER — IPRATROPIUM BROMIDE 0.02 % IN SOLN
0.5000 mg | Freq: Four times a day (QID) | RESPIRATORY_TRACT | Status: DC
  Administered 2018-02-27 – 2018-02-28 (×3): 0.5 mg via RESPIRATORY_TRACT
  Filled 2018-02-27 (×4): qty 2.5

## 2018-02-27 MED ORDER — METHYLPREDNISOLONE SODIUM SUCC 125 MG IJ SOLR
60.0000 mg | Freq: Three times a day (TID) | INTRAMUSCULAR | Status: DC
  Administered 2018-02-27 – 2018-03-02 (×9): 60 mg via INTRAVENOUS
  Filled 2018-02-27 (×9): qty 2

## 2018-02-27 MED ORDER — LEVALBUTEROL HCL 0.63 MG/3ML IN NEBU
0.6300 mg | INHALATION_SOLUTION | Freq: Four times a day (QID) | RESPIRATORY_TRACT | Status: DC
  Administered 2018-02-27 – 2018-02-28 (×3): 0.63 mg via RESPIRATORY_TRACT
  Filled 2018-02-27 (×4): qty 3

## 2018-02-27 MED ORDER — IPRATROPIUM BROMIDE 0.02 % IN SOLN
0.5000 mg | Freq: Four times a day (QID) | RESPIRATORY_TRACT | Status: DC
  Administered 2018-02-27: 0.5 mg via RESPIRATORY_TRACT
  Filled 2018-02-27: qty 2.5

## 2018-02-27 MED ORDER — METHYLPREDNISOLONE SODIUM SUCC 125 MG IJ SOLR
125.0000 mg | Freq: Once | INTRAMUSCULAR | Status: AC
  Administered 2018-02-27: 125 mg via INTRAVENOUS
  Filled 2018-02-27: qty 2

## 2018-02-27 MED ORDER — GUAIFENESIN-DM 100-10 MG/5ML PO SYRP: 5.0000 mL | ORAL_SOLUTION | ORAL | Status: DC | PRN

## 2018-02-27 NOTE — Plan of Care (Signed)

## 2018-02-27 NOTE — Progress Notes (Signed)
Patient is back into AFib. Rate controlled.

## 2018-02-27 NOTE — Progress Notes (Signed)
[facility]                                                                              Patient Demographics  [name] [name], is a 85 y.o. male, DOB - 1929-06-10, BZJ:[id number]  Admit date - 02/25/2018   Admitting Physician Guilford Shi, MD  Outpatient Primary MD for the patient is [name], MD  Outpatient specialists:   LOS - 2  days   Medical records reviewed and are as summarized below:    Chief Complaint  Patient presents with  . 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 fib on Tikosyn and Eliquis, CAD, CVA, diastolic CHF, GERD, asthma presented with worsening cough and shortness of breath.  Per patient started with nasal congestion, sore throat and cough and progressively worsened to the point that he was extremely winded after using the bathroom. In the ED, patient was found to have temp of 103.6, BP in low 100s, lactate 3.84, chest x-ray suggestive of left lower lobe infiltrates.  Influenza panel positive for influenza B.   Assessment & Plan    Principal Problem:   Sepsis ([place]) due to left lower lobe pneumonia and influenza B -Patient met sepsis criteria at the time of admission with fevers, lactic acidosis, hypotension, tachycardia, source secondary to left lung pneumonia and influenza B -Chest 1/2 with GPC however BC ID negative, continue IV Rocephin and doxycycline.   -Follow sputum cultures, urine strep antigen negative.   -Diffusely wheezing with rhonchi, continue scheduled nebs, DC Breo, placed on Brovana, flutter valve, IV Solu-Medrol 125 mg x1, then continue Solu-Medrol 60 mg every 8 hours  Active Problems: Influenza B -Influenza panel positive for influenza B, patient started on Tamiflu on 1/18  Persistent atrial fibrillation with RVR -Admission EKG showed prolonged QTC, 528.  Tikosyn was held overnight.  Zithromax was  discontinued -Repeat EKG this morning shows improved QTC to 443, Tikosyn restarted. -Patient currently in atrial fibrillation with RVR, takes Cardizem as needed at home for elevated heart rate - will give Cardizem 10 mg IV x1, increased metoprolol to 25 mg twice a day      Chronic diastolic heart failure (HCC) -Strict I's and O's and daily weights, net +1.3 L.  IV fluids have been discontinued -Encourage p.o. diet and hydration    Hyperlipidemia Continue Lipitor 20 mg daily    Essential hypertension -BP currently stable, continue beta-blocker, increase to 25 mg twice a day, Cardizem 10 mg IV x1.      CAD S/P OM PCI 2010 -Currently no chest pain, continue Tikosyn, statin, Eliquis    GERD -Continue PPI  asthma exacerbation -Wheezing and rhonchorous, likely from pneumonia, influenza, asthma exacerbation -Continue management as per #1  Anxiety Continue Wellbutrin   Code Status: Full CODE STATUS DVT Prophylaxis: Apixaban Family Communication: Discussed in detail with the patient, all imaging results, lab results explained to the patient and wife at the bedside   Disposition Plan: Diffusely wheezing and A. fib with RVR will need inpatient management  Time Spent  in minutes 35 minutes  Procedures:  None  Consultants:   None  Antimicrobials:   Anti-infectives (From admission, onward)   Start     Dose/Rate Route Frequency Ordered Stop   02/26/18 1000  oseltamivir (TAMIFLU) capsule 30 mg     30 mg Oral 2 times daily 02/26/18 0640 03/02/18 0959   02/26/18 0000  cefTRIAXone (ROCEPHIN) 1 g in sodium chloride 0.9 % 100 mL IVPB  Status:  Discontinued     1 g 200 mL/hr over 30 Minutes Intravenous Every 24 hours 02/25/18 1056 02/25/18 1214   02/25/18 1530  oseltamivir (TAMIFLU) capsule 30 mg  Status:  Discontinued     30 mg Oral 2 times daily 02/25/18 1520 02/26/18 0640   02/25/18 1430  doxycycline (VIBRA-TABS) tablet 100 mg     100 mg Oral Every 12 hours 02/25/18 1417      02/25/18 1230  doxycycline (VIBRA-TABS) tablet 100 mg  Status:  Discontinued     100 mg Oral Every 12 hours 02/25/18 1056 02/25/18 1417   02/25/18 0745  cefTRIAXone (ROCEPHIN) 2 g in sodium chloride 0.9 % 100 mL IVPB     2 g 200 mL/hr over 30 Minutes Intravenous Every 24 hours 02/25/18 0733     02/25/18 0745  azithromycin (ZITHROMAX) 500 mg in sodium chloride 0.9 % 250 mL IVPB  Status:  Discontinued     500 mg 250 mL/hr over 60 Minutes Intravenous Every 24 hours 02/25/18 0733 02/25/18 1026         Medications  Scheduled Meds: . apixaban  5 mg Oral BID  . atorvastatin  20 mg Oral Daily  . benzonatate  100 mg Oral TID  . budesonide (PULMICORT) nebulizer solution  0.25 mg Nebulization BID  . buPROPion  150 mg Oral Daily  . cholecalciferol  2,000 Units Oral Daily  . dofetilide  250 mcg Oral BID  . doxycycline  100 mg Oral Q12H  . fluticasone  1 spray Each Nare Daily  . guaiFENesin  600 mg Oral BID  . levalbuterol  0.63 mg Nebulization Q6H   And  . ipratropium  0.5 mg Nebulization Q6H  . loratadine  10 mg Oral Daily  . methylPREDNISolone (SOLU-MEDROL) injection  60 mg Intravenous Q8H  . metoprolol tartrate  25 mg Oral BID  . oseltamivir  30 mg Oral BID  . pantoprazole  20 mg Oral Daily  . tamsulosin  0.4 mg Oral Daily   Continuous Infusions: . cefTRIAXone (ROCEPHIN)  IV 2 g (02/27/18 0906)  . sodium chloride     PRN Meds:.guaiFENesin-dextromethorphan, levalbuterol, LORazepam, nitroGLYCERIN, ondansetron **OR** ondansetron (ZOFRAN) IV, oxyCODONE, polyethylene glycol, traMADol      Subjective:   [name] [name] was seen and examined today.  Low-grade temp of 99 F, in rapid A. fib at the time of my examination rate 100-1 20, diffusely wheezing, audible.   Patient denies dizziness, abdominal pain, N/V/D/C, new weakness, numbess, tingling.    Objective:   Vitals:   02/27/18 0623 02/27/18 0732 02/27/18 0900 02/27/18 1136  BP:  (!) 154/99  133/87  Pulse:  94 (!) 107 85  Resp:   '18 19 19  ' Temp: 99 F (37.2 C) 97.9 F (36.6 C)  98 F (36.7 C)  TempSrc: Oral Oral  Oral  SpO2:  98% 98% 99%  Weight:      Height:        Intake/Output Summary (Last 24 hours) at 02/27/2018 1222 Last data filed at 02/27/2018 1136 Gross per 24  hour  Intake 540 ml  Output 575 ml  Net -35 ml     Wt Readings from Last 3 Encounters:  02/25/18 70.8 kg  12/22/17 73.5 kg  10/31/17 70.3 kg    Physical Exam  General: Alert and oriented x 3, NAD  Eyes:   HEENT:  Atraumatic, normocephalic  Cardiovascular: S1 S2 clear, tachycardia, irregular irregular. No pedal edema b/l  Respiratory: Bilateral expiratory wheezing with rhonchi  Gastrointestinal: Soft, nontender, nondistended, NBS  Ext: no pedal edema bilaterally  Neuro: no new deficits  Musculoskeletal: No cyanosis, clubbing  Skin: No rashes  Psych: Normal affect and demeanor, alert and oriented x3     Data Reviewed:  I have personally reviewed following labs and imaging studies  Micro Results Recent Results (from the past 240 hour(s))  Culture, blood (Routine x 2)     Status: Abnormal   Collection Time: 02/25/18  6:45 AM  Result Value Ref Range Status   Specimen Description BLOOD BLOOD RIGHT FOREARM  Final   Special Requests   Final    BOTTLES DRAWN AEROBIC AND ANAEROBIC Blood Culture results may not be optimal due to an inadequate volume of blood received in culture bottles   Culture  Setup Time   Final    GRAM POSITIVE COCCI IN CLUSTERS AEROBIC BOTTLE ONLY CRITICAL RESULT CALLED TO, READ BACK BY AND VERIFIED WITH: PHARMD M PHAM [id number] MLM     Culture (A)  Final    MICROCOCCUS SPECIES Standardized susceptibility testing for this organism is not available. Performed at [facility], [place] [address], [place], [place] [address]    Report Status 02/27/2018 FINAL  Final  Blood Culture ID Panel (Reflexed)     Status: None   Collection Time: 02/25/18  6:45 AM  Result Value Ref Range Status    Enterococcus species NOT DETECTED NOT DETECTED Final   Listeria monocytogenes NOT DETECTED NOT DETECTED Final   Staphylococcus species NOT DETECTED NOT DETECTED Final   Staphylococcus aureus (BCID) NOT DETECTED NOT DETECTED Final   Streptococcus species NOT DETECTED NOT DETECTED Final   Streptococcus agalactiae NOT DETECTED NOT DETECTED Final   Streptococcus pneumoniae NOT DETECTED NOT DETECTED Final   Streptococcus pyogenes NOT DETECTED NOT DETECTED Final   Acinetobacter baumannii NOT DETECTED NOT DETECTED Final   Enterobacteriaceae species NOT DETECTED NOT DETECTED Final   Enterobacter cloacae complex NOT DETECTED NOT DETECTED Final   Escherichia coli NOT DETECTED NOT DETECTED Final   Klebsiella oxytoca NOT DETECTED NOT DETECTED Final   Klebsiella pneumoniae NOT DETECTED NOT DETECTED Final   Proteus species NOT DETECTED NOT DETECTED Final   Serratia marcescens NOT DETECTED NOT DETECTED Final   Haemophilus influenzae NOT DETECTED NOT DETECTED Final   Neisseria meningitidis NOT DETECTED NOT DETECTED Final   Pseudomonas aeruginosa NOT DETECTED NOT DETECTED Final   Candida albicans NOT DETECTED NOT DETECTED Final   Candida glabrata NOT DETECTED NOT DETECTED Final   Candida krusei NOT DETECTED NOT DETECTED Final   Candida parapsilosis NOT DETECTED NOT DETECTED Final   Candida tropicalis NOT DETECTED NOT DETECTED Final    Comment: Performed at [facility], [place] [address], [place], [place] [address]  Culture, blood (Routine x 2)     Status: None (Preliminary result)   Collection Time: 02/25/18  7:52 AM  Result Value Ref Range Status   Specimen Description BLOOD RIGHT ANTECUBITAL  Final   Special Requests   Final    BOTTLES DRAWN AEROBIC AND ANAEROBIC  Blood Culture adequate volume   Culture   Final    NO GROWTH 2 DAYS Performed at [facility], [place] [address] [place], [place] [address]    Report Status PENDING  Incomplete  MRSA PCR Screening     Status: None   Collection  Time: 02/25/18 12:01 PM  Result Value Ref Range Status   MRSA by PCR NEGATIVE NEGATIVE Final    Comment:        The GeneXpert MRSA Assay (FDA approved for NASAL specimens only), is one component of a comprehensive MRSA colonization surveillance program. It is not intended to diagnose MRSA infection nor to guide or monitor treatment for MRSA infections. Performed at [facility], [place] [address] [place], [place] [address]   Expectorated sputum assessment w rflx to resp cult     Status: None   Collection Time: 02/25/18  5:23 PM  Result Value Ref Range Status   Specimen Description SPUTUM  Final   Special Requests NONE  Final   Sputum evaluation   Final    THIS SPECIMEN IS ACCEPTABLE FOR SPUTUM CULTURE Performed at [facility], [place] [address] [place], [place] [address]    Report Status 02/25/2018 FINAL  Final  Culture, respiratory     Status: None (Preliminary result)   Collection Time: 02/25/18  5:23 PM  Result Value Ref Range Status   Specimen Description SPUTUM  Final   Special Requests NONE Reflexed from [id number]  Final   Gram Stain   Final    MODERATE WBC PRESENT, PREDOMINANTLY PMN RARE SQUAMOUS EPITHELIAL CELLS PRESENT RARE GRAM POSITIVE COCCI RARE GRAM POSITIVE RODS Performed at [facility], [place] [address], [place], [place] [address]    Culture FEW Consistent with normal respiratory flora.  Final   Report Status PENDING  Incomplete    Radiology Reports Dg Chest 2 View  Result Date: 02/25/2018 CLINICAL DATA:  Shortness of breath EXAM: CHEST - 2 VIEW COMPARISON:  April 02, 2016 FINDINGS: There is extensive airspace consolidation throughout much of the left lower lobe. Lungs elsewhere are clear. Heart size and pulmonary vascularity are normal. No adenopathy. There is aortic atherosclerosis. No evident bone lesions. There are surgical clips at the gastroesophageal junction. IMPRESSION: Extensive left lower lobe pneumonia. Lungs elsewhere clear. Heart  size normal. There is aortic atherosclerosis. Aortic Atherosclerosis (ICD10-I70.0). Followup PA and lateral chest radiographs recommended in 3-4 weeks following trial of antibiotic therapy to ensure resolution and exclude underlying malignancy. Electronically Signed   By: Lowella Grip III M.D.   On: 02/25/2018 07:20    Lab Data:  CBC: Recent Labs  Lab 02/25/18 0645 02/26/18 0330 02/27/18 0312  WBC 6.7 16.0* 14.0*  NEUTROABS 5.6  --   --   HGB 13.9 11.0* 10.4*  HCT 45.3 33.1* 32.3*  MCV 96.4 91.7 91.8  PLT 163 114* 779*   Basic Metabolic Panel: Recent Labs  Lab 02/25/18 0645 02/26/18 0330 02/27/18 0312  NA 138 135 132*  K 4.5 3.8 4.1  CL 102 105 104  CO2 23 20* 21*  GLUCOSE 148* 115* 103*  BUN 25* 21 16  CREATININE 1.27* 1.07 1.03  CALCIUM 8.9 7.7* 8.1*  MG  --  1.8  --    GFR: Estimated Creatinine Clearance: 51.6 mL/min (by C-G formula based on SCr of 1.03 mg/dL). Liver Function Tests: Recent Labs  Lab 02/25/18 0645  AST 24  ALT 21  ALKPHOS 91  BILITOT 0.6  PROT 6.6  ALBUMIN 3.5   No results for input(s): LIPASE, AMYLASE in the last 168 hours. No results for input(s): AMMONIA in the last 168 hours. Coagulation Profile: Recent Labs  Lab 02/25/18 0645  INR 1.16   Cardiac Enzymes: No results for input(s): CKTOTAL, CKMB, CKMBINDEX, TROPONINI in the last 168 hours. BNP (last 3 results) No results for input(s): PROBNP in the last 8760 hours. HbA1C: No results for input(s): HGBA1C in the last 72 hours. CBG: No results for input(s): GLUCAP in the last 168 hours. Lipid Profile: No results for input(s): CHOL, HDL, LDLCALC, TRIG, CHOLHDL, LDLDIRECT in the last 72 hours. Thyroid Function Tests: No results for input(s): TSH, T4TOTAL, FREET4, T3FREE, THYROIDAB in the last 72 hours. Anemia Panel: No results for input(s): VITAMINB12, FOLATE, FERRITIN, TIBC, IRON, RETICCTPCT in the last 72 hours. Urine analysis:    Component Value Date/Time   COLORURINE  YELLOW 02/25/2018 0700   APPEARANCEUR CLEAR 02/25/2018 0700   LABSPEC 1.011 02/25/2018 0700   PHURINE 5.0 02/25/2018 0700   GLUCOSEU NEGATIVE 02/25/2018 0700   HGBUR SMALL (A) 02/25/2018 0700   HGBUR trace-lysed 07/18/2009 1022   BILIRUBINUR NEGATIVE 02/25/2018 0700   BILIRUBINUR N 01/04/2017 1524   KETONESUR NEGATIVE 02/25/2018 0700   PROTEINUR NEGATIVE 02/25/2018 0700   UROBILINOGEN 0.2 01/04/2017 1524   UROBILINOGEN 0.2 07/18/2009 1022   NITRITE NEGATIVE 02/25/2018 0700   LEUKOCYTESUR NEGATIVE 02/25/2018 0700       M.D. [facility] 02/27/2018, 12:22 PM  Pager: [phone] Between 7am to 7pm - call Pager - [phone]  After 7pm go to [email or web address] - password [email or web address]  Call night coverage person covering after 7pm

## 2018-02-27 NOTE — Consult Note (Signed)
Cardiology Consultation:   Patient ID: [name] Spence, MD MRN: [id number]; DOB: Apr 08, 1929  Admit date: 02/25/2018 Date of Consult: 02/27/2018  Primary Care Provider: [name], MD Primary Cardiologist: Dr. 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 Tana Coast.  History of Present Illness:   [name] (Dr.) was admitted to [name] 02/25/2018 with cough/cold, congestion, he was febrile in the ER (rectal temp 103.6), currently septic with LLL pneumonia and influenza B. He was in NSR when he arrived but has reverted to atrial fib. He did get at least a dose of azithromycin and has been on doxycycline.  He has not had syncope and until recently very little in the way of atrial fib. He thinks 5 - 6 episodes in the past year. He has been sick with his current illness for the last week.    Past Medical History:  Diagnosis Date  . Allergic rhinitis   . Anxiety   . Asthma   . Atrial fibrillation ([name])   . CAD (coronary artery disease)   . Depression   . Diverticulosis 03/16/1995  . DJD (degenerative joint disease)    "most joints" (03/11/2015)  . GERD (gastroesophageal reflux disease) 12/30/2000  . Heart attack ([place]) 1984  . Hiatal hernia 12/30/2000  . History of blood transfusion 1988   "related to GI bleeding OR"  . History of duodenal ulcer 10/15/1986  . Hyperlipidemia   . Hypertension   . Sinus bradycardia   . Stroke [name])     Past Surgical History:  Procedure Laterality Date  . CARDIAC CATHETERIZATION    . CATARACT EXTRACTION W/ INTRAOCULAR LENS  IMPLANT, BILATERAL Bilateral ?2013  . CHOLECYSTECTOMY OPEN  1989   . CORONARY ANGIOPLASTY WITH STENT PLACEMENT  2010  . [name]  1984   heart attack, hypoplastic right coronary  . INGUINAL HERNIA REPAIR Right ~ 1990  . NASAL SEPTUM SURGERY  1972  . ORCHIECTOMY Bilateral 06/10/2017   Procedure: BILATERAL ORCHIECTOMY;  Surgeon: Franchot Gallo, MD;  Location: [facility];  Service: Urology;  Laterality: Bilateral;  MAC ANESTHESIA AND LOCAL  . TONSILLECTOMY    . TOTAL KNEE ARTHROPLASTY Bilateral 1998-2004   "right-left"  . VAGOTOMY AND PYLOROPLASTY  1988   "bleeding duodenal ulcer"     Home Medications:  Prior to Admission medications   Medication Sig Start Date End Date Taking? Authorizing Provider  albuterol (PROVENTIL HFA;VENTOLIN HFA) 108 (90 Base) MCG/ACT inhaler Inhale 1-2 puffs into the lungs every 6 (six) hours as needed for wheezing or shortness of breath.   Yes [provider]  apixaban (ELIQUIS) 5 MG TABS tablet Take 1 tablet (5 mg total) by mouth 2 (two) times daily. 11/10/17  Yes Larey Dresser, MD  ARNUITY ELLIPTA 200 MCG/ACT AEPB INHALE 1 PUFF INTO THE LUNGS DAILY Patient taking differently: Inhale 1 puff into the lungs daily.  11/29/17  Yes Brand Males, MD  atenolol (TENORMIN) 25 MG tablet Take 1 tablet (25 mg total) by mouth daily. 09/26/17  Yes Larey Dresser, MD  atorvastatin (LIPITOR) 20 MG tablet TAKE 1 TABLET(20  MG) BY MOUTH DAILY Patient taking differently: Take 20 mg by mouth daily.  11/21/17  Yes [name], MD  buPROPion [facility] SR) 150 MG 12 hr tablet Take 1 tablet (150 mg total) by mouth daily. 11/21/17  Yes [name], MD  cetirizine (ZYRTEC) 10 MG tablet Take 10 mg by mouth daily with supper.    Yes [provider]  Cholecalciferol (VITAMIN D) 2000 units tablet Take 2,000 Units by mouth daily.   Yes [provider]  diltiazem (CARDIZEM) 30 MG tablet Take 1 tablet every 4 hours AS NEEDED for afib heart rate >100 11/21/17  Yes [name], MD  dofetilide (TIKOSYN) 250 MCG  capsule TAKE 1 CAPSULE(250 MCG) BY MOUTH TWICE DAILY Patient taking differently: Take 250 mcg by mouth 2 (two) times daily.  01/24/18  Yes Bensimhon, Shaune Pascal, MD  levalbuterol [name] HFA) 45 MCG/ACT inhaler Inhale 1 puff into the lungs every 4 (four) hours as needed for wheezing or shortness of breath.   Yes [provider]  LORazepam (ATIVAN) 1 MG tablet TAKE 1/2 TO 1 TABLET BY MOUTH EVERY NIGHT AT BEDTIME AS NEEDED Patient taking differently: Take 0.5 mg by mouth at bedtime.  01/18/18  Yes Burchette, Alinda Sierras, MD  mometasone (NASONEX) 50 MCG/ACT nasal spray Place 1 spray into the nose daily as needed (for allergies).    Yes [provider]  nitroGLYCERIN (NITROSTAT) 0.4 MG SL tablet Place 1 tablet (0.4 mg total) under the tongue every 5 (five) minutes x 3 doses as needed for chest pain. 01/04/17  Yes [name], MD  omeprazole (PRILOSEC OTC) 20 MG tablet Take 1 tablet (20 mg total) by mouth daily. 09/17/13  Yes [name], MD  tamsulosin (FLOMAX) 0.4 MG CAPS capsule Take 1 capsule (0.4 mg total) by mouth daily. 11/21/17  Yes [name], MD  traMADol (ULTRAM) 50 MG tablet Take 1 tablet (50 mg total) by mouth every 12 (twelve) hours as needed. Patient taking differently: Take 50 mg by mouth every 12 (twelve) hours as needed for moderate pain.  11/21/17  Yes [name], MD  oxyCODONE (ROXICODONE) 5 MG immediate release tablet Take 1 tablet (5 mg total) by mouth every 4 (four) hours as needed for severe pain. Patient not taking: Reported on 02/25/2018 06/10/17   Franchot Gallo, MD    Inpatient Medications: Scheduled Meds: . apixaban  5 mg Oral BID  . atorvastatin  20 mg Oral Daily  . benzonatate  100 mg Oral TID  . budesonide (PULMICORT) nebulizer solution  0.25 mg Nebulization BID  . buPROPion  150 mg Oral Daily  . cholecalciferol  2,000 Units Oral Daily  . dofetilide  250 mcg Oral BID  . doxycycline  100 mg Oral Q12H  . fluticasone  1 spray Each Nare  Daily  . guaiFENesin  600 mg Oral BID  . levalbuterol  0.63 mg Nebulization Q6H   And  . ipratropium  0.5 mg Nebulization Q6H  . loratadine  10 mg Oral Daily  . methylPREDNISolone (SOLU-MEDROL) injection  60 mg Intravenous Q8H  . metoprolol tartrate  25 mg Oral BID  . oseltamivir  30 mg Oral BID  . pantoprazole  20 mg Oral Daily  . tamsulosin  0.4 mg Oral Daily   Continuous Infusions: . cefTRIAXone (ROCEPHIN)  IV 2 g (02/27/18 0906)  . sodium chloride     PRN Meds: guaiFENesin-dextromethorphan, levalbuterol, LORazepam, nitroGLYCERIN, ondansetron **OR** ondansetron (ZOFRAN) IV, oxyCODONE, polyethylene glycol, traMADol  Allergies:    Allergies  Allergen Reactions  . Tylenol [Acetaminophen] Rash  . Advil [Ibuprofen] Nausea Only and Rash  . Asa [Aspirin] Rash    Social History:   Social History   Socioeconomic History  . Marital status: Married    Spouse name: Not on file  . Number of children: 2  . Years of education: Not on file  . Highest education level: Not on file  Occupational History  . Occupation: IT sales professional: RETIRED  Social Needs  . Financial resource strain: Not on file  . Food insecurity:    Worry: Not on file    Inability: Not on file  . Transportation needs:    Medical: Not on file    Non-medical: Not on file  Tobacco Use  . Smoking status: Never Smoker  . Smokeless tobacco: Never Used  Substance and Sexual Activity  . Alcohol use: No  . Drug use: No  . Sexual activity: Not Currently  Lifestyle  . Physical activity:    Days per week: Not on file    Minutes per session: Not on file  . Stress: Not on file  Relationships  . Social connections:    Talks on phone: Not on file    Gets together: Not on file    Attends religious service: Not on file    Active member of club or organization: Not on file    Attends meetings of clubs or organizations: Not on file    Relationship status: Not on file  . Intimate partner violence:    Fear of  current or ex partner: Not on file    Emotionally abused: Not on file    Physically abused: Not on file    Forced sexual activity: Not on file  Other Topics Concern  . Not on file  Social History Narrative   Married. Regular exercise - yes.     Family History:    Family History  Problem Relation Age of Onset  . Coronary artery disease Mother        deceased  . Deep vein thrombosis Sister   . Coronary artery disease Brother   . Asthma Brother   . Colon cancer Neg Hx      ROS:  Please see the history of present illness.   All other ROS reviewed and negative.     Physical Exam/Data:   Vitals:   02/27/18 0732 02/27/18 0900 02/27/18 1136 02/27/18 1357  BP: (!) 154/99  133/87   Pulse: 94 (!) 107 85 96  Resp: 18 19 19  (!) 35  Temp: 97.9 F (36.6 C)  98 F (36.7 C)   TempSrc: Oral  Oral   SpO2: 98% 98% 99% 98%  Weight:      Height:        Intake/Output Summary (Last 24 hours) at 02/27/2018 1412 Last data filed at 02/27/2018 1136 Gross per 24 hour  Intake 540 ml  Output 575 ml  Net -35 ml   Last 3 Weights 02/25/2018 12/22/2017 10/31/2017  Weight (lbs) 156 lb 162 lb 155 lb  Weight (kg) 70.761 kg 73.483 kg 70.308 kg     Body mass index is 22.07 kg/m.  General:  Well nourished, well developed, elderly man, in no acute distress HEENT: normal Lymph: no adenopathy Neck: 6 cm JVD Endocrine:  No thryomegaly Vascular: No carotid bruits; FA pulses 2+ bilaterally without bruits  Cardiac:  normal S1, S2; IRIRR; 2/6 systolic murmur  Lungs:  Bilateral wheezes,  left lung with rhonchi and egophony.  Abd: soft, nontender, no hepatomegaly  Ext: no edema Musculoskeletal:  No deformities, BUE and BLE strength normal and equal Skin: warm and dry  Neuro:  CNs 2-12 intact, no focal abnormalities noted Psych:  Normal affect   EKG:  The EKG was personally reviewed and demonstrates:  Atrial fib with a RVR Telemetry:  Telemetry was personally reviewed and demonstrates:  Atrial fib with  an   Relevant CV Studies: 2D echo with normal LV function and severe TR Laboratory Data:  Chemistry Recent Labs  Lab 02/25/18 0645 02/26/18 0330 02/27/18 0312  NA 138 135 132*  K 4.5 3.8 4.1  CL 102 105 104  CO2 23 20* 21*  GLUCOSE 148* 115* 103*  BUN 25* 21 16  CREATININE 1.27* 1.07 1.03  CALCIUM 8.9 7.7* 8.1*  GFRNONAA 51* >60 >60  GFRAA 59* >60 >60  ANIONGAP 13 10 7     Recent Labs  Lab 02/25/18 0645  PROT 6.6  ALBUMIN 3.5  AST 24  ALT 21  ALKPHOS 91  BILITOT 0.6   Hematology Recent Labs  Lab 02/25/18 0645 02/26/18 0330 02/27/18 0312  WBC 6.7 16.0* 14.0*  RBC 4.70 3.61* 3.52*  HGB 13.9 11.0* 10.4*  HCT 45.3 33.1* 32.3*  MCV 96.4 91.7 91.8  MCH 29.6 30.5 29.5  MCHC 30.7 33.2 32.2  RDW 13.2 13.5 13.4  PLT 163 114* 108*   Cardiac EnzymesNo results for input(s): TROPONINI in the last 168 hours.  Recent Labs  Lab 02/25/18 0656  TROPIPOC 0.00    BNPNo results for input(s): BNP, PROBNP in the last 168 hours.  DDimer No results for input(s): DDIMER in the last 168 hours.  Radiology/Studies:  Dg Chest 2 View  Result Date: 02/25/2018 CLINICAL DATA:  Shortness of breath EXAM: CHEST - 2 VIEW COMPARISON:  April 02, 2016 FINDINGS: There is extensive airspace consolidation throughout much of the left lower lobe. Lungs elsewhere are clear. Heart size and pulmonary vascularity are normal. No adenopathy. There is aortic atherosclerosis. No evident bone lesions. There are surgical clips at the gastroesophageal junction. IMPRESSION: Extensive left lower lobe pneumonia. Lungs elsewhere clear. Heart size normal. There is aortic atherosclerosis. Aortic Atherosclerosis (ICD10-I70.0). Followup PA and lateral chest radiographs recommended in 3-4 weeks following trial of antibiotic therapy to ensure resolution and exclude underlying malignancy. Electronically Signed   By: Lowella Grip III M.D.   On: 02/25/2018 07:20    Assessment and Plan:   1. Atrial fib - he has  gone back to atrial fib in the setting of pneumonia. I have recommended he hold his dofetilide today. He will continue doxycycline. I have reviewed the device interactions and there does not appear to be one with doxy and dofetilide. It is not surprising that he has reverted to atrial fib. We will restart his dofetilide in the morning if QT is acceptable.  2. Pneumonia - question is what is best oral anti-biotic as an outpatient that does not interact with dofetilide. 3. QT prolongation - this appears better but was quite prolonged yesterday. We will follow. Keep his electrolytes replete.     For questions or updates, please contact [place] Please consult [email or web address] for contact info under   [email or web address].

## 2018-02-27 NOTE — Progress Notes (Signed)
Patient just converted into NSR with PVCs.

## 2018-02-28 ENCOUNTER — Encounter (HOSPITAL_COMMUNITY): Payer: Self-pay

## 2018-02-28 LAB — CBC
HEMATOCRIT: 33.5 % — AB (ref 39.0–52.0)
Hemoglobin: 10.8 g/dL — ABNORMAL LOW (ref 13.0–17.0)
MCH: 29 pg (ref 26.0–34.0)
MCHC: 32.2 g/dL (ref 30.0–36.0)
MCV: 89.8 fL (ref 80.0–100.0)
Platelets: 128 10*3/uL — ABNORMAL LOW (ref 150–400)
RBC: 3.73 MIL/uL — ABNORMAL LOW (ref 4.22–5.81)
RDW: 13 % (ref 11.5–15.5)
WBC: 12.2 10*3/uL — ABNORMAL HIGH (ref 4.0–10.5)
nRBC: 0 % (ref 0.0–0.2)

## 2018-02-28 LAB — BASIC METABOLIC PANEL
Anion gap: 9 (ref 5–15)
BUN: 20 mg/dL (ref 8–23)
CO2: 20 mmol/L — AB (ref 22–32)
Calcium: 8.5 mg/dL — ABNORMAL LOW (ref 8.9–10.3)
Chloride: 102 mmol/L (ref 98–111)
Creatinine, Ser: 0.98 mg/dL (ref 0.61–1.24)
GFR calc Af Amer: >60 mL/min (ref >60)
GFR calc non Af Amer: >60 mL/min (ref >60)
GLUCOSE: 196 mg/dL — AB (ref 70–99)
Potassium: 3.8 mmol/L (ref 3.5–5.1)
Sodium: 131 mmol/L — ABNORMAL LOW (ref 135–145)

## 2018-02-28 LAB — CULTURE, RESPIRATORY W GRAM STAIN: Culture: NORMAL

## 2018-02-28 LAB — MAGNESIUM: Magnesium: 1.8 mg/dL (ref 1.7–2.4)

## 2018-02-28 MED ORDER — IPRATROPIUM BROMIDE 0.02 % IN SOLN
0.5000 mg | Freq: Four times a day (QID) | RESPIRATORY_TRACT | Status: DC
  Administered 2018-02-28 – 2018-03-03 (×13): 0.5 mg via RESPIRATORY_TRACT
  Filled 2018-02-28 (×13): qty 2.5

## 2018-02-28 MED ORDER — DOFETILIDE 250 MCG PO CAPS
250.0000 ug | ORAL_CAPSULE | Freq: Two times a day (BID) | ORAL | Status: DC
  Administered 2018-03-01 – 2018-03-03 (×5): 250 ug via ORAL
  Filled 2018-02-28 (×6): qty 1

## 2018-02-28 MED ORDER — LEVALBUTEROL HCL 0.63 MG/3ML IN NEBU
1.2500 mg | INHALATION_SOLUTION | Freq: Four times a day (QID) | RESPIRATORY_TRACT | Status: DC
  Administered 2018-02-28 – 2018-03-01 (×5): 1.25 mg via RESPIRATORY_TRACT
  Filled 2018-02-28 (×9): qty 6

## 2018-02-28 MED ORDER — DILTIAZEM HCL 60 MG PO TABS
30.0000 mg | ORAL_TABLET | Freq: Three times a day (TID) | ORAL | Status: DC
  Administered 2018-02-28 – 2018-03-03 (×7): 30 mg via ORAL
  Filled 2018-02-28 (×8): qty 1

## 2018-02-28 MED ORDER — POTASSIUM CHLORIDE CRYS ER 20 MEQ PO TBCR
30.0000 meq | EXTENDED_RELEASE_TABLET | Freq: Once | ORAL | Status: AC
  Administered 2018-02-28: 30 meq via ORAL
  Filled 2018-02-28: qty 1

## 2018-02-28 MED ORDER — MAGNESIUM SULFATE IN D5W 1-5 GM/100ML-% IV SOLN
1.0000 g | Freq: Once | INTRAVENOUS | Status: AC
  Administered 2018-02-28: 1 g via INTRAVENOUS
  Filled 2018-02-28: qty 100

## 2018-02-28 NOTE — Progress Notes (Signed)
[facility]                                                                              Patient Demographics  Bufford Helms, is a 85 y.o. male, DOB - 1929/11/27, TDS:[id number]  Admit date - 02/25/2018   Admitting Physician Guilford Shi, MD  Outpatient Primary MD for the patient is [name], MD  Outpatient specialists:   LOS - 3  days   Medical records reviewed and are as summarized below:    Chief Complaint  Patient presents with  . 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 fib on Tikosyn and Eliquis, CAD, CVA, diastolic CHF, GERD, asthma presented with worsening cough and shortness of breath.  Per patient started with nasal congestion, sore throat and cough and progressively worsened to the point that he was extremely winded after using the bathroom. In the ED, patient was found to have temp of 103.6, BP in low 100s, lactate 3.84, chest x-ray suggestive of left lower lobe infiltrates.  Influenza panel positive for influenza B.   Assessment & Plan    Principal Problem:   Sepsis ([employer]) due to left lower lobe pneumonia and influenza B -Patient met sepsis criteria at the time of admission with fevers, lactic acidosis, hypotension, tachycardia, source secondary to left lung pneumonia and influenza B -Blood cultures 1/2 micrococcus species, likely contaminant  -Continue IV Rocephin and doxycycline, follow sputum cultures -Urine strep antigen and urine Legionella antigen negative -Still wheezing, continue Solu-Medrol 60 mg every 8 hours, Pulmicort, antibiotics, flutter valve, increase Xopenex to 1.25 mg every 6 hours with Atrovent -If no significant improvement by tomorrow, consider pulmonology consult  Active Problems: Influenza B -Influenza panel positive for influenza B, patient started on Tamiflu on 1/18  Persistent atrial fibrillation with  RVR -Admission EKG showed prolonged QTC, 528.  Tikosyn was held overnight.  Zithromax was discontinued.  Repeat EKG showed improved QTC and Tikosyn was resumed. -HR still not well controlled, started on Cardizem 30 mg every 8 hours oral, continue metoprolol 25 mg twice daily -EP cardiology consulted, seen by Dr. Lovena Le, managing Tikosyn    Chronic diastolic heart failure ([place]) -Strict I's and O's and daily weights, net 948 cc, IV fluids have been discontinued -Encourage p.o. diet and hydration    Hyperlipidemia Continue Lipitor 20 mg daily    Essential hypertension -BP stable, continue beta-blocker, added Cardizem 30 mg every 8 hours       CAD S/P OM PCI 2010 -Currently no chest pain, continue Tikosyn, statin, Eliquis    GERD -Continue PPI  asthma exacerbation -Wheezing and rhonchorous, likely from pneumonia, influenza, asthma exacerbation -Continue management as per #1  Anxiety Continue Wellbutrin   Code Status: Full CODE STATUS DVT Prophylaxis: Apixaban Family Communication: Discussed in detail with the patient, all imaging results, lab results explained to the patient    Disposition Plan: Still wheezing and A. fib with RVR will need inpatient management  Time Spent in minutes 35 minutes  Procedures:  None  Consultants:  EP cardiology  Antimicrobials:   Anti-infectives (From admission, onward)   Start     Dose/Rate Route Frequency Ordered Stop   02/26/18 1000  oseltamivir (TAMIFLU) capsule 30 mg     30 mg Oral 2 times daily 02/26/18 0640 03/02/18 0959   02/26/18 0000  cefTRIAXone (ROCEPHIN) 1 g in sodium chloride 0.9 % 100 mL IVPB  Status:  Discontinued     1 g 200 mL/hr over 30 Minutes Intravenous Every 24 hours 02/25/18 1056 02/25/18 1214   02/25/18 1530  oseltamivir (TAMIFLU) capsule 30 mg  Status:  Discontinued     30 mg Oral 2 times daily 02/25/18 1520 02/26/18 0640   02/25/18 1430  doxycycline (VIBRA-TABS) tablet 100 mg     100 mg Oral Every 12 hours  02/25/18 1417     02/25/18 1230  doxycycline (VIBRA-TABS) tablet 100 mg  Status:  Discontinued     100 mg Oral Every 12 hours 02/25/18 1056 02/25/18 1417   02/25/18 0745  cefTRIAXone (ROCEPHIN) 2 g in sodium chloride 0.9 % 100 mL IVPB     2 g 200 mL/hr over 30 Minutes Intravenous Every 24 hours 02/25/18 0733     02/25/18 0745  azithromycin (ZITHROMAX) 500 mg in sodium chloride 0.9 % 250 mL IVPB  Status:  Discontinued     500 mg 250 mL/hr over 60 Minutes Intravenous Every 24 hours 02/25/18 0733 02/25/18 1026         Medications  Scheduled Meds: . apixaban  5 mg Oral BID  . atorvastatin  20 mg Oral Daily  . benzonatate  100 mg Oral TID  . budesonide (PULMICORT) nebulizer solution  0.25 mg Nebulization BID  . buPROPion  150 mg Oral Daily  . cholecalciferol  2,000 Units Oral Daily  . diltiazem  30 mg Oral Q8H  . doxycycline  100 mg Oral Q12H  . fluticasone  1 spray Each Nare Daily  . guaiFENesin  600 mg Oral BID  . levalbuterol  1.25 mg Nebulization Q6H   And  . ipratropium  0.5 mg Nebulization Q6H  . loratadine  10 mg Oral Daily  . methylPREDNISolone (SOLU-MEDROL) injection  60 mg Intravenous Q8H  . metoprolol tartrate  25 mg Oral BID  . oseltamivir  30 mg Oral BID  . pantoprazole  20 mg Oral Daily  . tamsulosin  0.4 mg Oral Daily   Continuous Infusions: . cefTRIAXone (ROCEPHIN)  IV 2 g (02/28/18 0755)  . magnesium sulfate 1 - 4 g bolus IVPB    . sodium chloride     PRN Meds:.guaiFENesin-dextromethorphan, levalbuterol, LORazepam, nitroGLYCERIN, ondansetron **OR** ondansetron (ZOFRAN) IV, oxyCODONE, polyethylene glycol, traMADol      Subjective:   Rayshon Albaugh was seen and examined today.  States steroids made him somewhat anxious but tolerating.  No fevers or chills heart rate still low 100s, A. fib.  Still wheezing. Patient denies dizziness, abdominal pain, N/V/D/C, new weakness, numbess, tingling.    Objective:   Vitals:   02/28/18 0802 02/28/18 0804 02/28/18  0942 02/28/18 1000  BP:    (!) 141/90  Pulse:  (!) 108 (!) 101 (!) 104  Resp:  18  18  Temp:      TempSrc:      SpO2: 98% 97%  97%  Weight:      Height:        Intake/Output Summary (Last 24 hours) at 02/28/2018 1136 Last data filed at 02/28/2018 0841 Gross per 24 hour  Intake 460 ml  Output 825 ml  Net -365 ml     Wt Readings from Last 3 Encounters:  02/25/18 70.8 kg  12/22/17 73.5 kg  10/31/17 70.3 kg    Physical Exam  General: Alert and oriented x 3, NAD  Eyes:   HEENT:    Cardiovascular: S1 S2 clear, tachycardia, irregularly irregular, no pedal edema b/l  Respiratory: Bilateral expiratory wheezing  Gastrointestinal: Soft, nontender, nondistended, NBS  Ext: no pedal edema bilaterally  Neuro: no new deficits  Musculoskeletal: No cyanosis, clubbing  Skin: No rashes  Psych: Normal affect and demeanor, alert and oriented x3      Data Reviewed:  I have personally reviewed following labs and imaging studies  Micro Results Recent Results (from the past 240 hour(s))  Culture, blood (Routine x 2)     Status: Abnormal   Collection Time: 02/25/18  6:45 AM  Result Value Ref Range Status   Specimen Description BLOOD BLOOD RIGHT FOREARM  Final   Special Requests   Final    BOTTLES DRAWN AEROBIC AND ANAEROBIC Blood Culture results may not be optimal due to an inadequate volume of blood received in culture bottles   Culture  Setup Time   Final    GRAM POSITIVE COCCI IN CLUSTERS AEROBIC BOTTLE ONLY CRITICAL RESULT CALLED TO, READ BACK BY AND VERIFIED WITH: PHARMD M PHAM [id number] MLM     Culture (A)  Final    MICROCOCCUS SPECIES Standardized susceptibility testing for this organism is not available. Performed at [facility], [place] [address] [place], [place] [address]    Report Status 02/27/2018 FINAL  Final  Blood Culture ID Panel (Reflexed)     Status: None   Collection Time: 02/25/18  6:45 AM  Result Value Ref Range Status   Enterococcus species  NOT DETECTED NOT DETECTED Final   Listeria monocytogenes NOT DETECTED NOT DETECTED Final   Staphylococcus species NOT DETECTED NOT DETECTED Final   Staphylococcus aureus (BCID) NOT DETECTED NOT DETECTED Final   Streptococcus species NOT DETECTED NOT DETECTED Final   Streptococcus agalactiae NOT DETECTED NOT DETECTED Final   Streptococcus pneumoniae NOT DETECTED NOT DETECTED Final   Streptococcus pyogenes NOT DETECTED NOT DETECTED Final   Acinetobacter baumannii NOT DETECTED NOT DETECTED Final   Enterobacteriaceae species NOT DETECTED NOT DETECTED Final   Enterobacter cloacae complex NOT DETECTED NOT DETECTED Final   Escherichia coli NOT DETECTED NOT DETECTED Final   Klebsiella oxytoca NOT DETECTED NOT DETECTED Final   Klebsiella pneumoniae NOT DETECTED NOT DETECTED Final   Proteus species NOT DETECTED NOT DETECTED Final   Serratia marcescens NOT DETECTED NOT DETECTED Final   Haemophilus influenzae NOT DETECTED NOT DETECTED Final   Neisseria meningitidis NOT DETECTED NOT DETECTED Final   Pseudomonas aeruginosa NOT DETECTED NOT DETECTED Final   Candida albicans NOT DETECTED NOT DETECTED Final   Candida glabrata NOT DETECTED NOT DETECTED Final   Candida krusei NOT DETECTED NOT DETECTED Final   Candida parapsilosis NOT DETECTED NOT DETECTED Final   Candida tropicalis NOT DETECTED NOT DETECTED Final    Comment: Performed at [facility], [place] [address] [place] [address], [place], [place] [address]  Culture, blood (Routine x 2)     Status: None (Preliminary result)   Collection Time: 02/25/18  7:52 AM  Result Value Ref Range Status   Specimen Description BLOOD RIGHT ANTECUBITAL  Final   Special Requests   Final    BOTTLES DRAWN AEROBIC AND ANAEROBIC Blood Culture adequate volume   Culture  Final    NO GROWTH 2 DAYS Performed at [facility], [place] [address], [place], [place] [address]    Report Status PENDING  Incomplete  MRSA PCR Screening     Status: None   Collection Time: 02/25/18 12:01  PM  Result Value Ref Range Status   MRSA by PCR NEGATIVE NEGATIVE Final    Comment:        The GeneXpert MRSA Assay (FDA approved for NASAL specimens only), is one component of a comprehensive MRSA colonization surveillance program. It is not intended to diagnose MRSA infection nor to guide or monitor treatment for MRSA infections. Performed at [facility], [place] [address] [place], [place] [address]   Expectorated sputum assessment w rflx to resp cult     Status: None   Collection Time: 02/25/18  5:23 PM  Result Value Ref Range Status   Specimen Description SPUTUM  Final   Special Requests NONE  Final   Sputum evaluation   Final    THIS SPECIMEN IS ACCEPTABLE FOR SPUTUM CULTURE Performed at [facility], [place] [address] [place], [place] [address]    Report Status 02/25/2018 FINAL  Final  Culture, respiratory     Status: None   Collection Time: 02/25/18  5:23 PM  Result Value Ref Range Status   Specimen Description SPUTUM  Final   Special Requests NONE Reflexed from [id number]  Final   Gram Stain   Final    MODERATE WBC PRESENT, PREDOMINANTLY PMN RARE SQUAMOUS EPITHELIAL CELLS PRESENT RARE GRAM POSITIVE COCCI RARE GRAM POSITIVE RODS Performed at [facility], [place] [address] [place], [place] [address]    Culture FEW Consistent with normal respiratory flora.  Final   Report Status 02/28/2018 FINAL  Final    Radiology Reports Dg Chest 2 View  Result Date: 02/25/2018 CLINICAL DATA:  Shortness of breath EXAM: CHEST - 2 VIEW COMPARISON:  April 02, 2016 FINDINGS: There is extensive airspace consolidation throughout much of the left lower lobe. Lungs elsewhere are clear. Heart size and pulmonary vascularity are normal. No adenopathy. There is aortic atherosclerosis. No evident bone lesions. There are surgical clips at the gastroesophageal junction. IMPRESSION: Extensive left lower lobe pneumonia. Lungs elsewhere clear. Heart size normal. There is aortic  atherosclerosis. Aortic Atherosclerosis (ICD10-I70.0). Followup PA and lateral chest radiographs recommended in 3-4 weeks following trial of antibiotic therapy to ensure resolution and exclude underlying malignancy. Electronically Signed   By: Lowella Grip III M.D.   On: 02/25/2018 07:20    Lab Data:  CBC: Recent Labs  Lab 02/25/18 0645 02/26/18 0330 02/27/18 0312 02/28/18 0309  WBC 6.7 16.0* 14.0* 12.2*  NEUTROABS 5.6  --   --   --   HGB 13.9 11.0* 10.4* 10.8*  HCT 45.3 33.1* 32.3* 33.5*  MCV 96.4 91.7 91.8 89.8  PLT 163 114* 108* 818*   Basic Metabolic Panel: Recent Labs  Lab 02/25/18 0645 02/26/18 0330 02/27/18 0312 02/28/18 0309  NA 138 135 132* 131*  K 4.5 3.8 4.1 3.8  CL 102 105 104 102  CO2 23 20* 21* 20*  GLUCOSE 148* 115* 103* 196*  BUN 25* _0 CREATININE 1.27* 1.07 1.03 0.98  CALCIUM 8.9 7.7* 8.1* 8.5*  MG  --  1.8  --  1.8   GFR: Estimated Creatinine Clearance: 54.2 mL/min (by C-G formula based on SCr of 0.98 mg/dL). Liver Function Tests: Recent Labs  Lab 02/25/18 0645  AST 24  ALT 21  ALKPHOS 91  BILITOT 0.6  PROT 6.6  ALBUMIN 3.5   No results for input(s): LIPASE, AMYLASE in the last 168 hours. No results for input(s): AMMONIA in the last 168 hours. Coagulation Profile: Recent Labs  Lab 02/25/18 0645  INR 1.16   Cardiac Enzymes: No results for input(s): CKTOTAL, CKMB, CKMBINDEX, TROPONINI in the last 168 hours. BNP (last 3 results) No results for input(s): PROBNP in the last 8760 hours. HbA1C: No results for input(s): HGBA1C in the last 72 hours. CBG: No results for input(s): GLUCAP in the last 168 hours. Lipid Profile: No results for input(s): CHOL, HDL, LDLCALC, TRIG, CHOLHDL, LDLDIRECT in the last 72 hours. Thyroid Function Tests: No results for input(s): TSH, T4TOTAL, FREET4, T3FREE, THYROIDAB in the last 72 hours. Anemia Panel: No results for input(s): VITAMINB12, FOLATE, FERRITIN, TIBC, IRON, RETICCTPCT in the last 72  hours. Urine analysis:    Component Value Date/Time   COLORURINE YELLOW 02/25/2018 0700   APPEARANCEUR CLEAR 02/25/2018 0700   LABSPEC 1.011 02/25/2018 0700   PHURINE 5.0 02/25/2018 0700   GLUCOSEU NEGATIVE 02/25/2018 0700   HGBUR SMALL (A) 02/25/2018 0700   HGBUR trace-lysed 07/18/2009 1022   BILIRUBINUR NEGATIVE 02/25/2018 0700   BILIRUBINUR N 01/04/2017 1524   KETONESUR NEGATIVE 02/25/2018 0700   PROTEINUR NEGATIVE 02/25/2018 0700   UROBILINOGEN 0.2 01/04/2017 1524   UROBILINOGEN 0.2 07/18/2009 1022   NITRITE NEGATIVE 02/25/2018 0700   LEUKOCYTESUR NEGATIVE 02/25/2018 0700     Bralen Wiltgen M.D. [facility] 02/28/2018, 11:36 AM  Pager: [phone] Between 7am to 7pm - call Pager - [phone]  After 7pm go to [email or web address] - password [email or web address]  Call night coverage person covering after 7pm

## 2018-02-28 NOTE — Progress Notes (Addendum)
Progress Note  Patient Name: [name] Spence, MD Date of Encounter: 02/28/2018  Primary Cardiologist: Dr. Aundra Dubin Electrophysiologist: Dr. Rayann Heman (2017)  Subjective   Still with wheezing, less productive cough  Inpatient Medications    Scheduled Meds: . apixaban  5 mg Oral BID  . atorvastatin  20 mg Oral Daily  . benzonatate  100 mg Oral TID  . budesonide (PULMICORT) nebulizer solution  0.25 mg Nebulization BID  . buPROPion  150 mg Oral Daily  . cholecalciferol  2,000 Units Oral Daily  . diltiazem  30 mg Oral Q8H  . doxycycline  100 mg Oral Q12H  . fluticasone  1 spray Each Nare Daily  . guaiFENesin  600 mg Oral BID  . levalbuterol  1.25 mg Nebulization Q6H   And  . ipratropium  0.5 mg Nebulization Q6H  . loratadine  10 mg Oral Daily  . methylPREDNISolone (SOLU-MEDROL) injection  60 mg Intravenous Q8H  . metoprolol tartrate  25 mg Oral BID  . oseltamivir  30 mg Oral BID  . pantoprazole  20 mg Oral Daily  . tamsulosin  0.4 mg Oral Daily   Continuous Infusions: . cefTRIAXone (ROCEPHIN)  IV 2 g (02/28/18 0755)  . sodium chloride     PRN Meds: guaiFENesin-dextromethorphan, levalbuterol, LORazepam, nitroGLYCERIN, ondansetron **OR** ondansetron (ZOFRAN) IV, oxyCODONE, polyethylene glycol, traMADol   Vital Signs    Vitals:   02/28/18 0327 02/28/18 0743 02/28/18 0802 02/28/18 0804  BP: (!) 143/86 (!) 149/97    Pulse: (!) 103 (!) 121  (!) 108  Resp: 17 16  18   Temp: 98.4 F (36.9 C) 98 F (36.7 C)    TempSrc: Oral Oral    SpO2: 97% 98% 98% 97%  Weight:      Height:        Intake/Output Summary (Last 24 hours) at 02/28/2018 0821 Last data filed at 02/28/2018 0300 Gross per 24 hour  Intake 460 ml  Output 925 ml  Net -465 ml   Last 3 Weights 02/25/2018 12/22/2017 10/31/2017  Weight (lbs) 156 lb 162 lb 155 lb  Weight (kg) 70.761 kg 73.483 kg 70.308 kg      Telemetry    AFib, 90's-110, occ-frequent PVCs (multifocal), rare couplet - Personally Reviewed  ECG      AFib, PVCs, QT difficult to measure in Afib, agree with tracing, QTc 439ms - Personally Reviewed  Physical Exam   Exam remains unchanged GEN: No acute distress.   Neck: No JVD Cardiac: irreg-irreg, no murmurs, rubs, or gallops.  Respiratory: b/l wheezes, L lung rhonchi/egophony GI: Soft, nontender, non-distended  MS: No edema; No deformity, age appropriate atrophy Neuro:  Nonfocal  Psych: Normal affect   Labs    Chemistry Recent Labs  Lab 02/25/18 0645 02/26/18 0330 02/27/18 0312 02/28/18 0309  NA 138 135 132* 131*  K 4.5 3.8 4.1 3.8  CL 102 105 104 102  CO2 23 20* 21* 20*  GLUCOSE 148* 115* 103* 196*  BUN 25* 21 16 20   CREATININE 1.27* 1.07 1.03 0.98  CALCIUM 8.9 7.7* 8.1* 8.5*  PROT 6.6  --   --   --   ALBUMIN 3.5  --   --   --   AST 24  --   --   --   ALT 21  --   --   --   ALKPHOS 91  --   --   --   BILITOT 0.6  --   --   --   [facility]  51* >60 >60 >60  GFRAA 59* >60 >60 >60  ANIONGAP 13 10 7 9      Hematology Recent Labs  Lab 02/26/18 0330 02/27/18 0312 02/28/18 0309  WBC 16.0* 14.0* 12.2*  RBC 3.61* 3.52* 3.73*  HGB 11.0* 10.4* 10.8*  HCT 33.1* 32.3* 33.5*  MCV 91.7 91.8 89.8  MCH 30.5 29.5 29.0  MCHC 33.2 32.2 32.2  RDW 13.5 13.4 13.0  PLT 114* 108* 128*    Cardiac EnzymesNo results for input(s): TROPONINI in the last 168 hours.  Recent Labs  Lab 02/25/18 0656  TROPIPOC 0.00     BNPNo results for input(s): BNP, PROBNP in the last 168 hours.   DDimer No results for input(s): DDIMER in the last 168 hours.   Radiology    No results found.  Cardiac Studies   09/22/17: TTE Study Conclusions - Left ventricle: The cavity size was normal. Systolic function was   normal. The estimated ejection fraction was in the range of 60%   to 65%. Wall motion was normal; there were no regional wall   motion abnormalities. Doppler parameters are consistent with   abnormal left ventricular relaxation (grade 1 diastolic   dysfunction). - Aortic valve:  Trileaflet; moderately thickened, moderately   calcified leaflets. There was trivial regurgitation. - Mitral valve: Calcified annulus. Mildly thickened leaflets .   There was trivial regurgitation. - Left atrium: The atrium was mildly dilated. Volume/bsa, S: 36.7   ml/m^2. - Tricuspid valve: There was severe regurgitation. - Pulmonary arteries: Systolic pressure was moderately increased.   PA peak pressure: 62 mm Hg (S). Impressions: - Compared to the prior study, there has been no significant   interval change.  Patient Profile     85 y.o. male  with a hx of CAD (s/p PCI to LCx in 2010, w/h/o RV infarct and known chronic RCA occl from 1980's), HTN, HLD, GERD, asthma, chronic CHF (diastolic), CKD (stage 2), CVA, prostate cancer (mets s/p b/l orchiectomy may 2019), and AFib   Admitted for febrile illness, found with pneumonia, Influenza B, and in AFib   EP called with concerns of QT prolongation chronically on Tikosyn  Assessment & Plan    1. Paroxysmal AFib     CHA2DS2Vasc is 4, on Eliquis, appropriately dosed for creat/weight     Has been on Tikosyn for a couple years with infrequent episodes of AFib (by patient account)  Developed QT prolongation here, perhaps 2/2 azithromycin (single dose 1/18 AM) No Tikosyn last PM EKG this AM, QT looks better again Fair rate control with dilt  No other potentially QT prolonging drugs currently, in review with pharmacist this AM. Zithromax 1/2 life as long as 72 hours, though only got a single dose.  I doubt need to with 5 1/2 lives.  I suspect will be OK to resume Tikosyn tonight, will d/w Dr. Lovena Le  Calc Creat Cl is 54 (plan to resume 261mcg dose  I will give some K+ this AM (3.8) and pending mag level        For questions or updates, please contact [place] Please consult [email or web address] for contact info under        Signed, Baldwin [place], PA-C  02/28/2018, 8:21 AM     EP attending  Patient seen and examined.   Agree with the findings as noted above.  The patient appears to be improving gradually.  He only received a single dose of azithromycin, but he remains in atrial fibrillation.  Unfortunately his QT interval  is very difficult to calculate secondary to the very large fibrillation waves being superimposed on the T waves.  I discussed this with the patient.  His exam is unchanged except that he has been much better appearing.  He still has egophony on the left.  He will undergo reinitiation of dofetilide tomorrow.  He will need to be in the hospital for at least 2 if not 3 more days following the reinitiation of his dofetilide.  We will hold off on any additional QT prolonging medications at this time.  Cristopher Peru, MD

## 2018-03-01 DIAGNOSIS — Z9861 Coronary angioplasty status: Secondary | ICD-10-CM

## 2018-03-01 DIAGNOSIS — E7800 Pure hypercholesterolemia, unspecified: Secondary | ICD-10-CM

## 2018-03-01 DIAGNOSIS — K219 Gastro-esophageal reflux disease without esophagitis: Secondary | ICD-10-CM

## 2018-03-01 DIAGNOSIS — A419 Sepsis, unspecified organism: Principal | ICD-10-CM

## 2018-03-01 DIAGNOSIS — I2510 Atherosclerotic heart disease of native coronary artery without angina pectoris: Secondary | ICD-10-CM

## 2018-03-01 DIAGNOSIS — J181 Lobar pneumonia, unspecified organism: Secondary | ICD-10-CM

## 2018-03-01 DIAGNOSIS — Z8709 Personal history of other diseases of the respiratory system: Secondary | ICD-10-CM

## 2018-03-01 DIAGNOSIS — R6520 Severe sepsis without septic shock: Secondary | ICD-10-CM

## 2018-03-01 DIAGNOSIS — I5032 Chronic diastolic (congestive) heart failure: Secondary | ICD-10-CM

## 2018-03-01 DIAGNOSIS — J9601 Acute respiratory failure with hypoxia: Secondary | ICD-10-CM

## 2018-03-01 DIAGNOSIS — I10 Essential (primary) hypertension: Secondary | ICD-10-CM

## 2018-03-01 DIAGNOSIS — J101 Influenza due to other identified influenza virus with other respiratory manifestations: Secondary | ICD-10-CM

## 2018-03-01 LAB — CBC
HCT: 33.6 % — ABNORMAL LOW (ref 39.0–52.0)
Hemoglobin: 10.8 g/dL — ABNORMAL LOW (ref 13.0–17.0)
MCH: 29 pg (ref 26.0–34.0)
MCHC: 32.1 g/dL (ref 30.0–36.0)
MCV: 90.1 fL (ref 80.0–100.0)
Platelets: 160 10*3/uL (ref 150–400)
RBC: 3.73 MIL/uL — ABNORMAL LOW (ref 4.22–5.81)
RDW: 13.2 % (ref 11.5–15.5)
WBC: 13.1 10*3/uL — ABNORMAL HIGH (ref 4.0–10.5)
nRBC: 0 % (ref 0.0–0.2)

## 2018-03-01 LAB — BASIC METABOLIC PANEL
ANION GAP: 10 (ref 5–15)
BUN: 30 mg/dL — ABNORMAL HIGH (ref 8–23)
CO2: 20 mmol/L — ABNORMAL LOW (ref 22–32)
Calcium: 8.8 mg/dL — ABNORMAL LOW (ref 8.9–10.3)
Chloride: 103 mmol/L (ref 98–111)
Creatinine, Ser: 1.11 mg/dL (ref 0.61–1.24)
GFR calc Af Amer: >60 mL/min (ref >60)
GFR calc non Af Amer: 60 mL/min — ABNORMAL LOW (ref >60)
Glucose, Bld: 215 mg/dL — ABNORMAL HIGH (ref 70–99)
Potassium: 4.3 mmol/L (ref 3.5–5.1)
SODIUM: 133 mmol/L — AB (ref 135–145)

## 2018-03-01 LAB — MAGNESIUM: MAGNESIUM: 2 mg/dL (ref 1.7–2.4)

## 2018-03-01 NOTE — Progress Notes (Signed)
PROGRESS NOTE    [name] Spence, MD  WOE:[id number] DOB: Feb 10, 1929 DOA: 02/25/2018 PCP: [name], MD   Brief Narrative:  HPI on 02/25/2018 by Dr. 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  Patient reports nasal congestion, sore throat and cough which started last Monday and progressively worsened to shortness of breath/wheezing.  This morning  he was extremely short winded after using the bathroom and was brought in by family for further evaluation and management.  According to wife (who is a retired Marine scientist), patient does not like to go to the hospital and was trying to self manage at home.  Patient states he felt his asthma was flaring up.  Both patient and wife deny history of fevers at home.  Patient has chronic reflux and always has head and of the bed elevated to 45 degrees.  He denies any leg swellings and does not use diuretics on a daily basis although he has Lasix 20 mg available to use as needed for history of diastolic CHF.   On presentation to the ED he was noted to have rectal temp of 003.7, systolic blood pressure in low 100s and labs showing lactate of 3.84 which improved to 1.9 after 500 mL of fluids.  Chest x-ray suggestive of left lower lobe infiltrate.  UA negative.  Patient reports intolerance/allergy to aspirin, acetaminophen and ibuprofen.  Hence he did not receive any treatment for fever and repeat oral temperature bedside during my evaluation was 97.5 F.  He is requiring 2 L of nasal cannula to keep sats greater than 92%.  Interim history Patient presented with cough as well as shortness of breath.  Found to be positive for influenza B as well as pneumonia on chest x-ray.  Currently being  treated.  Also noted to have atrial fibrillation with RVR, cardiology/EP consulted. Assessment & Plan   Sepsis secondary to left lower lobe pneumonia and influenza B -On admission, patient had fever, lactic acidosis, tachycardia, hypotension -Blood cultures sent 1/2 micrococcus species, likely contaminant -Patient placed on IV Rocephin, doxycycline, Tamiflu -Sputum culture few consistent with normal respiratory flora -Urine strep pneumonia and Legionella antigens negative -Continue Solu-Medrol for wheezing, Pulmicort, flutter valve and nebulizer treatments  Influenza B -Continue Tamiflu, started on 02/25/2018  Persistent atrial fibrillation with RVR -On admission, EKG showed prolonged QTC, 528. -Cardiology/EP consulted and appreciated -Tikosyn was held and has been restarted today -Heart rate remains uncontrolled, continue cardizem, metoprolol  Chronic diastolic heart failure -Currently appears to be euvolemic and compensated -Patient was receiving IV fluids given his sepsis however these have been discontinued -Continue to monitor intake and output, daily weights  Hyperlipidemia -Continue statin  Essential hypertension -Continue metoprolol, cardizem,  CAD -Status post OM PCI 2010 -Currently no chest pain  GERD -Continue PPI  Asthma exacerbation -Suspect secondary to pneumonia, influenza -Continue treatment plan as above  Anxiety -Continue Wellbutrin  DVT Prophylaxis  Eliquis  Code Status: Full  Family Communication: None at bedside  Disposition Plan: admitted, pending further recommendations from EP  Consultants Cardiology/EP  Procedures  None  Antibiotics   Anti-infectives (From admission, onward)   Start     Dose/Rate Route Frequency Ordered Stop   02/26/18 1000  oseltamivir (TAMIFLU) capsule 30 mg  30 mg Oral 2 times daily 02/26/18 0640 03/02/18 0959   02/26/18 0000  cefTRIAXone (ROCEPHIN) 1 g in sodium chloride 0.9 % 100 mL IVPB  Status:   Discontinued     1 g 200 mL/hr over 30 Minutes Intravenous Every 24 hours 02/25/18 1056 02/25/18 1214   02/25/18 1530  oseltamivir (TAMIFLU) capsule 30 mg  Status:  Discontinued     30 mg Oral 2 times daily 02/25/18 1520 02/26/18 0640   02/25/18 1430  doxycycline (VIBRA-TABS) tablet 100 mg     100 mg Oral Every 12 hours 02/25/18 1417     02/25/18 1230  doxycycline (VIBRA-TABS) tablet 100 mg  Status:  Discontinued     100 mg Oral Every 12 hours 02/25/18 1056 02/25/18 1417   02/25/18 0745  cefTRIAXone (ROCEPHIN) 2 g in sodium chloride 0.9 % 100 mL IVPB     2 g 200 mL/hr over 30 Minutes Intravenous Every 24 hours 02/25/18 0733     02/25/18 0745  azithromycin (ZITHROMAX) 500 mg in sodium chloride 0.9 % 250 mL IVPB  Status:  Discontinued     500 mg 250 mL/hr over 60 Minutes Intravenous Every 24 hours 02/25/18 0733 02/25/18 1026      Subjective:   [name] [name] seen and examined today.  Feels breathing has improved, continues to have cough but does not feel he is able to get much up.  Denies current chest pain, abdominal pain, nausea or vomiting, diarrhea or constipation, dizziness or headache.  Objective:   Vitals:   03/01/18 0700 03/01/18 0808 03/01/18 0811 03/01/18 1135  BP: (!) 147/95   137/78  Pulse: (!) 102   (!) 110  Resp: 20   18  Temp: 97.8 F (36.6 C)   97.6 F (36.4 C)  TempSrc: Axillary   Oral  SpO2: 96% 96% 99% 97%  Weight:      Height:        Intake/Output Summary (Last 24 hours) at 03/01/2018 1251 Last data filed at 03/01/2018 1137 Gross per 24 hour  Intake 118 ml  Output 1250 ml  Net -1132 ml   Filed Weights   02/25/18 0643  Weight: 70.8 kg    Exam  General: Well developed, well nourished, NAD, appears stated age  82: NCAT, mucous membranes moist.   Neck: Supple  Cardiovascular: S1 S2 auscultated, irregularly irregular, no murmur  Respiratory: Bilateral expiratory wheezing, mild  Abdomen: Soft, nontender, nondistended, + bowel  sounds  Extremities: warm dry without cyanosis clubbing or edema  Neuro: AAOx3, nonfocal  Psych: Normal affect and demeanor    Data Reviewed: I have personally reviewed following labs and imaging studies  CBC: Recent Labs  Lab 02/25/18 0645 02/26/18 0330 02/27/18 0312 02/28/18 0309 03/01/18 0246  WBC 6.7 16.0* 14.0* 12.2* 13.1*  NEUTROABS 5.6  --   --   --   --   HGB 13.9 11.0* 10.4* 10.8* 10.8*  HCT 45.3 33.1* 32.3* 33.5* 33.6*  MCV 96.4 91.7 91.8 89.8 90.1  PLT 163 114* 108* 128* 371   Basic Metabolic Panel: Recent Labs  Lab 02/25/18 0645 02/26/18 0330 02/27/18 0312 02/28/18 0309 03/01/18 0246  NA 138 135 132* 131* 133*  K 4.5 3.8 4.1 3.8 4.3  CL 102 105 104 102 103  CO2 23 20* 21* 20* 20*  GLUCOSE 148* 115* 103* 196* 215*  BUN 25* 21 16 20  30*  CREATININE 1.27* 1.07 1.03 0.98 1.11  CALCIUM 8.9 7.7* 8.1* 8.5* 8.8*  MG  --  1.8  --  1.8 2.0   GFR: Estimated Creatinine Clearance: 47.8 mL/min (by C-G formula based on SCr of 1.11 mg/dL). Liver Function Tests: Recent Labs  Lab 02/25/18 0645  AST 24  ALT 21  ALKPHOS 91  BILITOT 0.6  PROT 6.6  ALBUMIN 3.5   No results for input(s): LIPASE, AMYLASE in the last 168 hours. No results for input(s): AMMONIA in the last 168 hours. Coagulation Profile: Recent Labs  Lab 02/25/18 0645  INR 1.16   Cardiac Enzymes: No results for input(s): CKTOTAL, CKMB, CKMBINDEX, TROPONINI in the last 168 hours. BNP (last 3 results) No results for input(s): PROBNP in the last 8760 hours. HbA1C: No results for input(s): HGBA1C in the last 72 hours. CBG: No results for input(s): GLUCAP in the last 168 hours. Lipid Profile: No results for input(s): CHOL, HDL, LDLCALC, TRIG, CHOLHDL, LDLDIRECT in the last 72 hours. Thyroid Function Tests: No results for input(s): TSH, T4TOTAL, FREET4, T3FREE, THYROIDAB in the last 72 hours. Anemia Panel: No results for input(s): VITAMINB12, FOLATE, FERRITIN, TIBC, IRON, RETICCTPCT in the  last 72 hours. Urine analysis:    Component Value Date/Time   COLORURINE YELLOW 02/25/2018 0700   APPEARANCEUR CLEAR 02/25/2018 0700   LABSPEC 1.011 02/25/2018 0700   PHURINE 5.0 02/25/2018 0700   GLUCOSEU NEGATIVE 02/25/2018 0700   HGBUR SMALL (A) 02/25/2018 0700   HGBUR trace-lysed 07/18/2009 1022   BILIRUBINUR NEGATIVE 02/25/2018 0700   BILIRUBINUR N 01/04/2017 1524   KETONESUR NEGATIVE 02/25/2018 0700   PROTEINUR NEGATIVE 02/25/2018 0700   UROBILINOGEN 0.2 01/04/2017 1524   UROBILINOGEN 0.2 07/18/2009 1022   NITRITE NEGATIVE 02/25/2018 0700   LEUKOCYTESUR NEGATIVE 02/25/2018 0700   Sepsis Labs: @LABRCNTIP (procalcitonin:4,lacticidven:4)  ) Recent Results (from the past 240 hour(s))  Culture, blood (Routine x 2)     Status: Abnormal   Collection Time: 02/25/18  6:45 AM  Result Value Ref Range Status   Specimen Description BLOOD BLOOD RIGHT FOREARM  Final   Special Requests   Final    BOTTLES DRAWN AEROBIC AND ANAEROBIC Blood Culture results may not be optimal due to an inadequate volume of blood received in culture bottles   Culture  Setup Time   Final    GRAM POSITIVE COCCI IN CLUSTERS AEROBIC BOTTLE ONLY CRITICAL RESULT CALLED TO, READ BACK BY AND VERIFIED WITH: PHARMD M PHAM [id number] MLM     Culture (A)  Final    MICROCOCCUS SPECIES Standardized susceptibility testing for this organism is not available. Performed at [facility], [place] [address], [place], [place] [address]    Report Status 02/27/2018 FINAL  Final  Blood Culture ID Panel (Reflexed)     Status: None   Collection Time: 02/25/18  6:45 AM  Result Value Ref Range Status   Enterococcus species NOT DETECTED NOT DETECTED Final   Listeria monocytogenes NOT DETECTED NOT DETECTED Final   Staphylococcus species NOT DETECTED NOT DETECTED Final   Staphylococcus aureus (BCID) NOT DETECTED NOT DETECTED Final   Streptococcus species NOT DETECTED NOT DETECTED Final   Streptococcus agalactiae NOT DETECTED  NOT DETECTED Final   Streptococcus pneumoniae NOT DETECTED NOT DETECTED Final   Streptococcus pyogenes NOT DETECTED NOT DETECTED Final   Acinetobacter baumannii NOT DETECTED NOT DETECTED Final   Enterobacteriaceae species NOT DETECTED NOT DETECTED Final   Enterobacter cloacae complex NOT DETECTED NOT DETECTED Final   Escherichia coli NOT DETECTED NOT DETECTED Final   Klebsiella oxytoca NOT DETECTED NOT DETECTED Final   Klebsiella  pneumoniae NOT DETECTED NOT DETECTED Final   Proteus species NOT DETECTED NOT DETECTED Final   Serratia marcescens NOT DETECTED NOT DETECTED Final   Haemophilus influenzae NOT DETECTED NOT DETECTED Final   Neisseria meningitidis NOT DETECTED NOT DETECTED Final   Pseudomonas aeruginosa NOT DETECTED NOT DETECTED Final   Candida albicans NOT DETECTED NOT DETECTED Final   Candida glabrata NOT DETECTED NOT DETECTED Final   Candida krusei NOT DETECTED NOT DETECTED Final   Candida parapsilosis NOT DETECTED NOT DETECTED Final   Candida tropicalis NOT DETECTED NOT DETECTED Final    Comment: Performed at [facility], [place] [address] [place], [place] [address]  Culture, blood (Routine x 2)     Status: None (Preliminary result)   Collection Time: 02/25/18  7:52 AM  Result Value Ref Range Status   Specimen Description BLOOD RIGHT ANTECUBITAL  Final   Special Requests   Final    BOTTLES DRAWN AEROBIC AND ANAEROBIC Blood Culture adequate volume   Culture   Final    NO GROWTH 4 DAYS Performed at [facility], [place] [address], [place], [place] [address]    Report Status PENDING  Incomplete  MRSA PCR Screening     Status: None   Collection Time: 02/25/18 12:01 PM  Result Value Ref Range Status   MRSA by PCR NEGATIVE NEGATIVE Final    Comment:        The GeneXpert MRSA Assay (FDA approved for NASAL specimens only), is one component of a comprehensive MRSA colonization surveillance program. It is not intended to diagnose MRSA infection nor to guide  or monitor treatment for MRSA infections. Performed at [facility], [place] [address], [place], [place] [address]   Expectorated sputum assessment w rflx to resp cult     Status: None   Collection Time: 02/25/18  5:23 PM  Result Value Ref Range Status   Specimen Description SPUTUM  Final   Special Requests NONE  Final   Sputum evaluation   Final    THIS SPECIMEN IS ACCEPTABLE FOR SPUTUM CULTURE Performed at [facility], [place] [address], [place], [place] [address]    Report Status 02/25/2018 FINAL  Final  Culture, respiratory     Status: None   Collection Time: 02/25/18  5:23 PM  Result Value Ref Range Status   Specimen Description SPUTUM  Final   Special Requests NONE Reflexed from [id number]  Final   Gram Stain   Final    MODERATE WBC PRESENT, PREDOMINANTLY PMN RARE SQUAMOUS EPITHELIAL CELLS PRESENT RARE GRAM POSITIVE COCCI RARE GRAM POSITIVE RODS Performed at [facility], [place] [address] [place], [place] [address]    Culture FEW Consistent with normal respiratory flora.  Final   Report Status 02/28/2018 FINAL  Final      Radiology Studies: No results found.   Scheduled Meds: . apixaban  5 mg Oral BID  . atorvastatin  20 mg Oral Daily  . benzonatate  100 mg Oral TID  . budesonide (PULMICORT) nebulizer solution  0.25 mg Nebulization BID  . buPROPion  150 mg Oral Daily  . cholecalciferol  2,000 Units Oral Daily  . diltiazem  30 mg Oral Q8H  . dofetilide  250 mcg Oral BID  . doxycycline  100 mg Oral Q12H  . fluticasone  1 spray Each Nare Daily  . guaiFENesin  600 mg Oral BID  . levalbuterol  1.25 mg Nebulization Q6H   And  . ipratropium  0.5 mg Nebulization Q6H  . loratadine  10 mg Oral Daily  . methylPREDNISolone (SOLU-MEDROL) injection  60 mg Intravenous Q8H  . metoprolol tartrate  25 mg Oral BID  . oseltamivir  30 mg Oral BID  . pantoprazole  20 mg Oral Daily  . tamsulosin  0.4 mg Oral Daily   Continuous Infusions: . cefTRIAXone (ROCEPHIN)  IV  2 g (03/01/18 1035)  . sodium chloride       LOS: 4 days   Time Spent in minutes   30 minutes  Jannatul Wojdyla D.O. on 03/01/2018 at 12:51 PM  Between 7am to 7pm - Please see pager noted on [email or web address]  After 7pm go to [email or web address]  And look for the night coverage person covering for me after hours  [facility] Office  [phone]

## 2018-03-01 NOTE — Progress Notes (Addendum)
Progress Note  Patient Name: [name], MD Date of Encounter: 03/01/2018  Primary Cardiologist: Dr. Aundra Dubin Electrophysiologist: Dr. Rayann Heman (2017)  Subjective   Still with wheezing, less productive cough, feels better daily  Inpatient Medications    Scheduled Meds: . apixaban  5 mg Oral BID  . atorvastatin  20 mg Oral Daily  . benzonatate  100 mg Oral TID  . budesonide (PULMICORT) nebulizer solution  0.25 mg Nebulization BID  . buPROPion  150 mg Oral Daily  . cholecalciferol  2,000 Units Oral Daily  . diltiazem  30 mg Oral Q8H  . dofetilide  250 mcg Oral BID  . doxycycline  100 mg Oral Q12H  . fluticasone  1 spray Each Nare Daily  . guaiFENesin  600 mg Oral BID  . levalbuterol  1.25 mg Nebulization Q6H   And  . ipratropium  0.5 mg Nebulization Q6H  . loratadine  10 mg Oral Daily  . methylPREDNISolone (SOLU-MEDROL) injection  60 mg Intravenous Q8H  . metoprolol tartrate  25 mg Oral BID  . oseltamivir  30 mg Oral BID  . pantoprazole  20 mg Oral Daily  . tamsulosin  0.4 mg Oral Daily   Continuous Infusions: . cefTRIAXone (ROCEPHIN)  IV 2 g (02/28/18 0755)  . sodium chloride     PRN Meds: guaiFENesin-dextromethorphan, levalbuterol, LORazepam, nitroGLYCERIN, oxyCODONE, polyethylene glycol, traMADol   Vital Signs    Vitals:   03/01/18 0300 03/01/18 0700 03/01/18 0808 03/01/18 0811  BP: (!) 140/91 (!) 147/95    Pulse: (!) 120 (!) 102    Resp: 17 20    Temp: 97.7 F (36.5 C) 97.8 F (36.6 C)    TempSrc: Oral Axillary    SpO2: 95% 96% 96% 99%  Weight:      Height:        Intake/Output Summary (Last 24 hours) at 03/01/2018 0914 Last data filed at 03/01/2018 0750 Gross per 24 hour  Intake 358 ml  Output 950 ml  Net -592 ml   Last 3 Weights 02/25/2018 12/22/2017 10/31/2017  Weight (lbs) 156 lb 162 lb 155 lb  Weight (kg) 70.761 kg 73.483 kg 70.308 kg      Telemetry    AFib, 90's-110, occ-frequent PVCs (multifocal), rare couplet - Personally  Reviewed  ECG    02/28/2018: AFib, PVCs, QT difficult to measure in Afib, QTc 48ms by machine - Personally Reviewed  Physical Exam   Exam remains unchanged GEN: No acute distress.   Neck: No JVD Cardiac: irreg-irreg, no murmurs, rubs, or gallops.  Respiratory: b/l wheezes, L lung rhonchi/egophony has largely resolved and wheezes are improved. GI: Soft, nontender, non-distended  MS: No edema; No deformity, age appropriate atrophy Neuro:  Nonfocal  Psych: Normal affect   Labs    Chemistry Recent Labs  Lab 02/25/18 0645  02/27/18 0312 02/28/18 0309 03/01/18 0246  NA 138   < > 132* 131* 133*  K 4.5   < > 4.1 3.8 4.3  CL 102   < > 104 102 103  CO2 23   < > 21* 20* 20*  GLUCOSE 148*   < > 103* 196* 215*  BUN 25*   < > 16 20 30*  CREATININE 1.27*   < > 1.03 0.98 1.11  CALCIUM 8.9   < > 8.1* 8.5* 8.8*  PROT 6.6  --   --   --   --   ALBUMIN 3.5  --   --   --   --  AST 24  --   --   --   --   ALT 21  --   --   --   --   ALKPHOS 91  --   --   --   --   BILITOT 0.6  --   --   --   --   GFRNONAA 51*   < > >60 >60 60*  GFRAA 59*   < > >60 >60 >60  ANIONGAP 13   < > 7 9 10    < > = values in this interval not displayed.     Hematology Recent Labs  Lab 02/27/18 0312 02/28/18 0309 03/01/18 0246  WBC 14.0* 12.2* 13.1*  RBC 3.52* 3.73* 3.73*  HGB 10.4* 10.8* 10.8*  HCT 32.3* 33.5* 33.6*  MCV 91.8 89.8 90.1  MCH 29.5 29.0 29.0  MCHC 32.2 32.2 32.1  RDW 13.4 13.0 13.2  PLT 108* 128* 160    Cardiac EnzymesNo results for input(s): TROPONINI in the last 168 hours.  Recent Labs  Lab 02/25/18 0656  TROPIPOC 0.00     BNPNo results for input(s): BNP, PROBNP in the last 168 hours.   DDimer No results for input(s): DDIMER in the last 168 hours.   Radiology    No results found.  Cardiac Studies   09/22/17: TTE Study Conclusions - Left ventricle: The cavity size was normal. Systolic function was   normal. The estimated ejection fraction was in the range of 60%   to  65%. Wall motion was normal; there were no regional wall   motion abnormalities. Doppler parameters are consistent with   abnormal left ventricular relaxation (grade 1 diastolic   dysfunction). - Aortic valve: Trileaflet; moderately thickened, moderately   calcified leaflets. There was trivial regurgitation. - Mitral valve: Calcified annulus. Mildly thickened leaflets .   There was trivial regurgitation. - Left atrium: The atrium was mildly dilated. Volume/bsa, S: 36.7   ml/m^2. - Tricuspid valve: There was severe regurgitation. - Pulmonary arteries: Systolic pressure was moderately increased.   PA peak pressure: 62 mm Hg (S). Impressions: - Compared to the prior study, there has been no significant   interval change.  Patient Profile     85 y.o. male  with a hx of CAD (s/p PCI to LCx in 2010, w/h/o RV infarct and known chronic RCA occl from 1980's), HTN, HLD, GERD, asthma, chronic CHF (diastolic), CKD (stage 2), CVA, prostate cancer (mets s/p b/l orchiectomy may 2019), and AFib   Admitted for febrile illness, found with pneumonia, Influenza B, and in AFib   EP called with concerns of QT prolongation chronically on Tikosyn  Assessment & Plan    1. Paroxysmal AFib     CHA2DS2Vasc is 4, on Eliquis, appropriately dosed for creat/weight     Has been on Tikosyn for a couple years with infrequent episodes of AFib (by patient account)  Developed QT prolongation here, perhaps 2/2 azithromycin (single dose 1/18 AM)  Will resume Tikosyn today K+ 4.3 Mag 2.0 Creat 1.11 (Calc CrCl is 48)  Resume 256mcg this AM, post dose EKG, follow QT closely        For questions or updates, please contact [facility] Please consult [email or web address] for contact info under        Signed, Baldwin Jamaica, PA-C  03/01/2018, 9:14 AM    EP Attending  Patient seen examined. Agree with the findings as noted above with minimal modification. He continues to improve. We will restart dofetilide today  and follow QT interval. Might consider DCCV later in the week but if he is ready for DC home, he could come back. He will need his QT followed in the hospital until Friday morning (5th dose). Hopefully he will revert back to NSR but presence of pneumonia makes this less likely.  Mikle Bosworth.D.

## 2018-03-01 NOTE — Progress Notes (Addendum)
Not included in today's progress note, though I did discuss with the patient this morning.  He has been very compliant with his Eliquis, with no missed doses in the last 3 weeks (and much longer), including the AM of his admission.  Mentions that he has a reminder for his medicines, and never misses.  He has been maintained on Eliquis here as well, without interruption.  Tommye Standard, PA-C  Mikle Bosworth.D.

## 2018-03-01 NOTE — Consult Note (Signed)
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  Patient reports having "shortness of breath, coughing, wheezing and fever", was found to be positive for influenza B as well as pneumonia on chest x-ray which all led tothis admission. (sepsis secondary to left lower lobe pneumonia and influenza B, persistent atrial fibrillation with rapid ventricular response, HTN, asthma exacerbation)   PatientendorsesDr. Carolann [name] (since Dr. Stevie [name] retired) with [facility] [place] his primary care provider.   [facility] [address] [facility].  Patientreportsthathe has beenmanaging his medicationsat home with use of "pill box" system filled every week.  Patient's wife states that he has been driving prior to admission, but wife will be able to provide transportation  to his doctors' appointments after discharge.  Patientstates living with wife who will be his primary caregiver at home.  Anticipated plan for discharge is homewith home health services per wife.  Patientand wife voicedunderstanding to call primary care provider's office after dischargefor a post discharge follow-up appointment within 1- 2 weeksor sooner if needs arise. Patient letter (with PCP's contact number) was provided astheirreminder.  Explained to patientand wife regardingTHN CM services available for health managementand resourcesat home and both indicated not needing any services at this time since patient's health conditions are being managed well without pressing issues and with close follow-up with his providers. Both patient and wife are very well informed and knowledgeable in managing his health needs at home.  Patient  however, had verbally agreed and optedforEMMI Pneumonia calls tofollow-up with hisrecovery at home.  Referralmade forEMMI Pneumonia Calls after discharge.  Patient and wife had verbalizedunderstanding to seek referral to [facility] care managementfrom primary care provider ifdeemed necessary and appropriate forfurtherservices in thenear future.   [facility] care management information provided for future needs that may arise.  Primary care provider's office is listed as providing transition of care (TOC) follow-up.    For additional questions please contact:  Edwena Felty A. Dortha Neighbors, BSN, RN-BC [facility] PRIMARY CARE Navigator Cell: [phone]

## 2018-03-02 LAB — BASIC METABOLIC PANEL
Anion gap: 7 (ref 5–15)
BUN: 31 mg/dL — AB (ref 8–23)
CO2: 21 mmol/L — ABNORMAL LOW (ref 22–32)
Calcium: 8.6 mg/dL — ABNORMAL LOW (ref 8.9–10.3)
Chloride: 103 mmol/L (ref 98–111)
Creatinine, Ser: 0.97 mg/dL (ref 0.61–1.24)
GFR calc Af Amer: >60 mL/min (ref >60)
GFR calc non Af Amer: >60 mL/min (ref >60)
Glucose, Bld: 220 mg/dL — ABNORMAL HIGH (ref 70–99)
Potassium: 4.6 mmol/L (ref 3.5–5.1)
Sodium: 131 mmol/L — ABNORMAL LOW (ref 135–145)

## 2018-03-02 LAB — CULTURE, BLOOD (ROUTINE X 2)
Culture: NO GROWTH
Special Requests: ADEQUATE

## 2018-03-02 LAB — MAGNESIUM: Magnesium: 2.1 mg/dL (ref 1.7–2.4)

## 2018-03-02 MED ORDER — METHYLPREDNISOLONE SODIUM SUCC 125 MG IJ SOLR
60.0000 mg | Freq: Two times a day (BID) | INTRAMUSCULAR | Status: DC
  Administered 2018-03-02 – 2018-03-03 (×2): 60 mg via INTRAVENOUS
  Filled 2018-03-02 (×2): qty 2

## 2018-03-02 NOTE — Progress Notes (Signed)
Physical Therapy Treatment Patient Details Name: [name] STINES, MD MRN: [id number] DOB: [birth year] Today's Date: 03/02/2018    History of Present Illness Pt adm with sepsis due to flu B and PNA. PMH - CVA, afib with rvr, CAD, chf, asthma    PT Comments    Pt seen again to mobilize and try the rollator. Pt did well with rollator and agreeable to use at home. He believes his wife is currently out taking care of getting a rollator. Discussed working with home health PT at [place] to work toward return to his previous active lifestyle. Also instructed to perform repeated sit to stands to strengthen legs and improve power.    Follow Up Recommendations  Home health PT     Equipment Recommendations  Other (comment)(rollator - pt says wife is likely taking care of this)    Recommendations for Other Services       Precautions / Restrictions Precautions Precautions: Fall    Mobility  Bed Mobility               General bed mobility comments: Pt up in chair  Transfers Overall transfer level: Needs assistance Equipment used: 4-wheeled walker Transfers: Sit to/from Stand Sit to Stand: Supervision         General transfer comment: assist for safety  Ambulation/Gait Ambulation/Gait assistance: Supervision Gait Distance (Feet): 150 Feet Assistive device: 4-wheeled walker Gait Pattern/deviations: Step-through pattern;Decreased stride length;Trunk flexed Gait velocity: decr Gait velocity interpretation: >2.62 ft/sec, indicative of community ambulatory General Gait Details: supervision for safety   Stairs             Wheelchair Mobility    Modified Rankin (Stroke Patients Only)       Balance Overall balance assessment: Needs assistance Sitting-balance support: No upper extremity supported;Feet supported Sitting balance-Leahy Scale: Good     Standing balance support: No upper extremity supported;During functional activity Standing balance-Leahy Scale: Fair                               Cognition Arousal/Alertness: Awake/alert Behavior During Therapy: WFL for tasks assessed/performed Overall Cognitive Status: Within Functional Limits for tasks assessed                                        Exercises      General Comments        Pertinent Vitals/Pain Pain Assessment: No/denies pain    Home Living Family/patient expects to be discharged to:: Private residence Living Arrangements: Spouse/significant other Available Help at Discharge: Family;Available 24 hours/day Type of Home: House Home Access: Stairs to enter Entrance Stairs-Rails: Right;Left;Can reach both Home Layout: Multi-level;Able to live on main level with bedroom/bathroom Home Equipment: Grab bars - tub/shower;Shower seat - built in      Prior Function Level of Independence: Independent      Comments: played golf last week   PT Goals (current goals can now be found in the care plan section) Acute Rehab PT Goals Patient Stated Goal: play golf  PT Goal Formulation: With patient Time For Goal Achievement: 03/09/18 Potential to Achieve Goals: Good Progress towards PT goals: Progressing toward goals    Frequency    Min 3X/week      PT Plan Current plan remains appropriate    Co-evaluation  AM-PAC PT "6 Clicks" Mobility   Outcome Measure  Help needed turning from your back to your side while in a flat bed without using bedrails?: A Little Help needed moving from lying on your back to sitting on the side of a flat bed without using bedrails?: A Little Help needed moving to and from a bed to a chair (including a wheelchair)?: A Little Help needed standing up from a chair using your arms (e.g., wheelchair or bedside chair)?: A Little Help needed to walk in hospital room?: A Little Help needed climbing 3-5 steps with a railing? : A Little 6 Click Score: 18    End of Session   Activity Tolerance: Patient tolerated  treatment well Patient left: in chair;with call bell/phone within reach Nurse Communication: Mobility status PT Visit Diagnosis: Unsteadiness on feet (R26.81);Muscle weakness (generalized) (M62.81)     Time: 2060-1561 PT Time Calculation (min) (ACUTE ONLY): 12 min  Charges:  $Gait Training: 8-22 mins                     [place] Pager [phone] Office [place] 03/02/2018, 3:35 PM

## 2018-03-02 NOTE — Progress Notes (Addendum)
Progress Note  Patient Name: [name] Spence, MD Date of Encounter: 03/02/2018  Primary Cardiologist: Dr. Aundra Dubin Electrophysiologist: Dr. Rayann Heman (2017)  Subjective   Still with wheezing, less productive cough, feels better daily  Inpatient Medications    Scheduled Meds: . apixaban  5 mg Oral BID  . atorvastatin  20 mg Oral Daily  . benzonatate  100 mg Oral TID  . budesonide (PULMICORT) nebulizer solution  0.25 mg Nebulization BID  . buPROPion  150 mg Oral Daily  . cholecalciferol  2,000 Units Oral Daily  . diltiazem  30 mg Oral Q8H  . dofetilide  250 mcg Oral BID  . doxycycline  100 mg Oral Q12H  . fluticasone  1 spray Each Nare Daily  . guaiFENesin  600 mg Oral BID  . levalbuterol  1.25 mg Nebulization Q6H   And  . ipratropium  0.5 mg Nebulization Q6H  . loratadine  10 mg Oral Daily  . methylPREDNISolone (SOLU-MEDROL) injection  60 mg Intravenous Q12H  . metoprolol tartrate  25 mg Oral BID  . pantoprazole  20 mg Oral Daily  . tamsulosin  0.4 mg Oral Daily   Continuous Infusions: . cefTRIAXone (ROCEPHIN)  IV 2 g (03/02/18 0745)  . sodium chloride     PRN Meds: guaiFENesin-dextromethorphan, levalbuterol, LORazepam, nitroGLYCERIN, oxyCODONE, polyethylene glycol, traMADol   Vital Signs    Vitals:   03/02/18 0600 03/02/18 0631 03/02/18 0723 03/02/18 0737  BP: (!) 175/115 (!) 172/88 (!) 178/90 (!) 178/90  Pulse: 75 71 79 77  Resp: 15 16 15 20   Temp:   98.2 F (36.8 C)   TempSrc:   Axillary   SpO2: 97% 97% 95% 97%  Weight:      Height:        Intake/Output Summary (Last 24 hours) at 03/02/2018 0819 Last data filed at 03/02/2018 0726 Gross per 24 hour  Intake -  Output 925 ml  Net -925 ml   Last 3 Weights 02/25/2018 12/22/2017 10/31/2017  Weight (lbs) 156 lb 162 lb 155 lb  Weight (kg) 70.761 kg 73.483 kg 70.308 kg      Telemetry    AFib, 90's-110, occ-frequent PVCs >> SR, occ at times more frequent PVCs  - Personally Reviewed  ECG    Post-dose AFib  80bpm, measured QT 463ms, QTc 485  This AM is SR, 77bpm, I measure QT at 420ms, corrects to 486ms  Personally Reviewed  Physical Exam    GEN: No acute distress.   Neck: No JVD Cardiac: RRR, soft SM, rubs, or gallops.  Respiratory: remains with b/l exp wheezes GI: Soft, nontender, non-distended  MS: No edema; No deformity, age appropriate atrophy Neuro:  Nonfocal  Psych: Normal affect   Labs    Chemistry Recent Labs  Lab 02/25/18 0645  02/28/18 0309 03/01/18 0246 03/02/18 0250  NA 138   < > 131* 133* 131*  K 4.5   < > 3.8 4.3 4.6  CL 102   < > 102 103 103  CO2 23   < > 20* 20* 21*  GLUCOSE 148*   < > 196* 215* 220*  BUN 25*   < > 20 30* 31*  CREATININE 1.27*   < > 0.98 1.11 0.97  CALCIUM 8.9   < > 8.5* 8.8* 8.6*  PROT 6.6  --   --   --   --   ALBUMIN 3.5  --   --   --   --   AST 24  --   --   --   --  ALT 21  --   --   --   --   ALKPHOS 91  --   --   --   --   BILITOT 0.6  --   --   --   --   GFRNONAA 51*   < > >60 60* >60  GFRAA 59*   < > >60 >60 >60  ANIONGAP 13   < > 9 10 7    < > = values in this interval not displayed.     Hematology Recent Labs  Lab 02/27/18 0312 02/28/18 0309 03/01/18 0246  WBC 14.0* 12.2* 13.1*  RBC 3.52* 3.73* 3.73*  HGB 10.4* 10.8* 10.8*  HCT 32.3* 33.5* 33.6*  MCV 91.8 89.8 90.1  MCH 29.5 29.0 29.0  MCHC 32.2 32.2 32.1  RDW 13.4 13.0 13.2  PLT 108* 128* 160    Cardiac EnzymesNo results for input(s): TROPONINI in the last 168 hours.  Recent Labs  Lab 02/25/18 0656  TROPIPOC 0.00     BNPNo results for input(s): BNP, PROBNP in the last 168 hours.   DDimer No results for input(s): DDIMER in the last 168 hours.   Radiology    No results found.  Cardiac Studies   09/22/17: TTE Study Conclusions - Left ventricle: The cavity size was normal. Systolic function was   normal. The estimated ejection fraction was in the range of 60%   to 65%. Wall motion was normal; there were no regional wall   motion abnormalities.  Doppler parameters are consistent with   abnormal left ventricular relaxation (grade 1 diastolic   dysfunction). - Aortic valve: Trileaflet; moderately thickened, moderately   calcified leaflets. There was trivial regurgitation. - Mitral valve: Calcified annulus. Mildly thickened leaflets .   There was trivial regurgitation. - Left atrium: The atrium was mildly dilated. Volume/bsa, S: 36.7   ml/m^2. - Tricuspid valve: There was severe regurgitation. - Pulmonary arteries: Systolic pressure was moderately increased.   PA peak pressure: 62 mm Hg (S). Impressions: - Compared to the prior study, there has been no significant   interval change.  Patient Profile     85 y.o. male  with a hx of CAD (s/p PCI to LCx in 2010, w/h/o RV infarct and known chronic RCA occl from 1980's), HTN, HLD, GERD, asthma, chronic CHF (diastolic), CKD (stage 2), CVA, prostate cancer (mets s/p b/l orchiectomy may 2019), and AFib   Admitted for febrile illness, found with pneumonia, Influenza B, and in AFib   EP called with concerns of QT prolongation chronically on Tikosyn  Assessment & Plan    1. Paroxysmal AFib     CHA2DS2Vasc is 4, on Eliquis, appropriately dosed for creat/weight     Has been on Tikosyn for a couple years with infrequent episodes of AFib (by patient account)  Developed QT prolongation here, perhaps 2/2 azithromycin (single dose 1/18 AM)  Tikosyn resumed yesterday Patient confirmed compliance with his Eliquis at home, inclluding Am of admission, has been continued without interruption here K+ 4.6 Mag 2.1 Creat 0.97 (Calc CrCl is 55)  He has converted to SR, QT looks OK Follow at his 3 hour EKG      For questions or updates, please contact [facility] Please consult [email or web address] for contact info under    Signed, Baldwin [place], PA-C  03/02/2018, 8:19 AM    EP Attending  Patient seen and examined. Agree with above. The patient has reverted to NSR and his initial QT is ok.  I would watch  another day and [facility] home on 1/24 if QTC is ok and pneumonia continues to respond.   Mikle Bosworth.D.

## 2018-03-02 NOTE — Progress Notes (Signed)
CARDIAC REHAB PHASE I   PRE:  Rate/Rhythm: 75SR  BP:  Sitting: 156/96      SaO2: 96 RA  MODE:  Ambulation: 340 ft   85 peak HR  POST:  Rate/Rhythm: 82 SR  BP:  Sitting: 133/82    SaO2: 97 RA   Pt ambulated 366ft in hallway assist of one with front wheel walker. Pt instructed to stay inside of walker with turns. Pt to recliner after walk, bedside table and call bell within reach. PT to see later, will continue to follow.  [id number] Rufina Falco, RN BSN 03/02/2018 10:25 AM

## 2018-03-02 NOTE — Evaluation (Signed)
Physical Therapy Evaluation Patient Details Name: [name] MENDEZ, MD MRN: [id number] DOB: Dec 24, 1929 Today's Date: 03/02/2018   History of Present Illness  Pt adm with sepsis due to flu B and PNA. PMH - CVA, afib with rvr, CAD, chf, asthma  Clinical Impression  Pt presents to PT with unsteady gait and decreased activity tolerance. Expect pt to make good progress and be able to return home with wife. Pt used rolling walker but may do better with rollator. Will try rollator later today.     Follow Up Recommendations Home health PT    Equipment Recommendations  Other (comment)(TBD later today)    Recommendations for Other Services       Precautions / Restrictions Precautions Precautions: Fall      Mobility  Bed Mobility               General bed mobility comments: Pt up in chair  Transfers Overall transfer level: Needs assistance Equipment used: Rolling walker (2 wheeled) Transfers: Sit to/from Stand Sit to Stand: Min guard         General transfer comment: assist for safety  Ambulation/Gait Ambulation/Gait assistance: Min guard;Min assist Gait Distance (Feet): 375 Feet Assistive device: Rolling walker (2 wheeled);1 person hand held assist Gait Pattern/deviations: Step-through pattern;Decreased stride length Gait velocity: decr Gait velocity interpretation: 1.31 - 2.62 ft/sec, indicative of limited community ambulator General Gait Details: with walker assist for safety and verbal cues to stay closer to walker with turns. With no device needs min assist for static standing  Stairs            Wheelchair Mobility    Modified Rankin (Stroke Patients Only)       Balance Overall balance assessment: Needs assistance Sitting-balance support: No upper extremity supported;Feet supported Sitting balance-Leahy Scale: Good     Standing balance support: No upper extremity supported;During functional activity Standing balance-Leahy Scale: Fair                               Pertinent Vitals/Pain Pain Assessment: No/denies pain    Home Living Family/patient expects to be discharged to:: Private residence Living Arrangements: Spouse/significant other Available Help at Discharge: Family;Available 24 hours/day Type of Home: House Home Access: Stairs to enter Entrance Stairs-Rails: Right;Left;Can reach both Entrance Stairs-Number of Steps: 2 Home Layout: Multi-level;Able to live on main level with bedroom/bathroom Home Equipment: Grab bars - tub/shower;Shower seat - built in      Prior Function Level of Independence: Independent         Comments: played golf last week     Hand Dominance   Dominant Hand: Right    Extremity/Trunk Assessment   Upper Extremity Assessment Upper Extremity Assessment: Generalized weakness    Lower Extremity Assessment Lower Extremity Assessment: Generalized weakness       Communication   Communication: HOH  Cognition Arousal/Alertness: Awake/alert Behavior During Therapy: WFL for tasks assessed/performed Overall Cognitive Status: Within Functional Limits for tasks assessed                                        General Comments      Exercises     Assessment/Plan    PT Assessment Patient needs continued PT services  PT Problem List Decreased strength;Decreased activity tolerance;Decreased balance;Decreased mobility;Decreased knowledge of use of DME  PT Treatment Interventions DME instruction;Gait training;Stair training;Functional mobility training;Therapeutic activities;Therapeutic exercise;Balance training;Patient/family education    PT Goals (Current goals can be found in the Care Plan section)  Acute Rehab PT Goals Patient Stated Goal: play golf  PT Goal Formulation: With patient Time For Goal Achievement: 03/09/18 Potential to Achieve Goals: Good    Frequency Min 3X/week   Barriers to discharge Inaccessible home environment stairs to enter     Co-evaluation               AM-PAC PT "6 Clicks" Mobility  Outcome Measure Help needed turning from your back to your side while in a flat bed without using bedrails?: A Little Help needed moving from lying on your back to sitting on the side of a flat bed without using bedrails?: A Little Help needed moving to and from a bed to a chair (including a wheelchair)?: A Little Help needed standing up from a chair using your arms (e.g., wheelchair or bedside chair)?: A Little Help needed to walk in hospital room?: A Little Help needed climbing 3-5 steps with a railing? : A Little 6 Click Score: 18    End of Session   Activity Tolerance: Patient tolerated treatment well Patient left: in chair;with call bell/phone within reach Nurse Communication: Mobility status PT Visit Diagnosis: Unsteadiness on feet (R26.81);Muscle weakness (generalized) (M62.81)    Time: 8676-1950 PT Time Calculation (min) (ACUTE ONLY): 10 min   Charges:   PT Evaluation $PT Eval Low Complexity: [place] Pager [phone] Office [place] 03/02/2018, 1:50 PM

## 2018-03-02 NOTE — Progress Notes (Signed)
Inpatient Diabetes Program Recommendations  AACE/ADA: New Consensus Statement on Inpatient Glycemic Control (2015)  Target Ranges:  Prepandial:   less than 140 mg/dL      Peak postprandial:   less than 180 mg/dL (1-2 hours)      Critically ill patients:  140 - 180 mg/dL   Lab Results  Component Value Date   GLUCAP 141 (H) 03/31/2015   HGBA1C 6.3 (H) 04/01/2015    Review of Glycemic Control  Diabetes history: No hx noted  Inpatient Diabetes Program Recommendations:   While patient on steroids in hospital, -Glycemic control order set with sensitive Novolog correction scale tid  Thank you, Nani Gasser. Onalee Steinbach, RN, MSN, CDE  Diabetes Coordinator Inpatient Glycemic Control Team Team Pager [phone] (8am-5pm) 03/02/2018 8:38 AM

## 2018-03-02 NOTE — Progress Notes (Signed)
Pt SBP in the 170's. NP notified. No new orders in place at this time. Will continue to monitor.

## 2018-03-02 NOTE — Progress Notes (Signed)
PROGRESS NOTE    [name], MD  ZOX:[id number] DOB: 13-Mar-1929 DOA: 02/25/2018 PCP: [name], MD   Brief Narrative:  HPI on 02/25/2018 by Dr. 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  Patient reports nasal congestion, sore throat and cough which started last Monday and progressively worsened to shortness of breath/wheezing.  This morning  he was extremely short winded after using the bathroom and was brought in by family for further evaluation and management.  According to wife (who is a retired [employer] scientist), patient does not like to go to the hospital and was trying to self manage at home.  Patient states he felt his asthma was flaring up.  Both patient and wife deny history of fevers at home.  Patient has chronic reflux and always has head and of the bed elevated to 45 degrees.  He denies any leg swellings and does not use diuretics on a daily basis although he has Lasix 20 mg available to use as needed for history of diastolic CHF.   On presentation to the ED he was noted to have rectal temp of 811.9, systolic blood pressure in low 100s and labs showing lactate of 3.84 which improved to 1.9 after 500 mL of fluids.  Chest x-ray suggestive of left lower lobe infiltrate.  UA negative.  Patient reports intolerance/allergy to aspirin, acetaminophen and ibuprofen.  Hence he did not receive any treatment for fever and repeat oral temperature bedside during my evaluation was 97.5 F.  He is requiring 2 L of nasal cannula to keep sats greater than 92%.  Interim history Patient presented with cough as well as shortness of breath.  Found to be positive for influenza B as well as pneumonia on chest x-ray.  Currently being  treated.  Also noted to have atrial fibrillation with RVR, cardiology/EP consulted. Assessment & Plan   Sepsis secondary to left lower lobe pneumonia and influenza B -On admission, patient had fever, lactic acidosis, tachycardia, hypotension -Blood cultures sent 1/2 micrococcus species, likely contaminant -Patient placed on IV Rocephin- received one dose. Currently on ceftriaxone, doxycycline, Tamiflu -Sputum culture few consistent with normal respiratory flora -Urine strep pneumonia and Legionella antigens negative -Continue Solu-Medrol for wheezing  (weaning), Pulmicort, flutter valve and nebulizer treatments  Influenza B -Continue Tamiflu, started on 02/25/2018  Persistent atrial fibrillation with RVR -On admission, EKG showed prolonged QTC, 528. -Cardiology/EP consulted and appreciated -Continue Tikosyn per EP- likely to finish "loading" on 03/03/2018 -Heart rate better controlled today, currently in sinus rhythm  -Continue cardizem, metoprolol -Continue Eliquis  Chronic diastolic heart failure -Currently appears to be euvolemic and compensated -Patient was receiving IV fluids given his sepsis however these have been discontinued -Continue to monitor intake and output, daily weights  Hyperlipidemia -Continue statin  Essential hypertension -Continue metoprolol, cardizem  CAD -Status post OM PCI 2010 -Currently no chest pain  GERD -Continue PPI  Asthma exacerbation -Suspect secondary to pneumonia, influenza -Continue treatment plan as above  Anxiety -Continue Wellbutrin  DVT Prophylaxis  Eliquis  Code Status: Full  Family Communication: None at bedside  Disposition Plan: admitted, pending further recommendations from EP  Consultants Cardiology/EP  Procedures  None  Antibiotics   Anti-infectives (From admission, onward)   Start  Dose/Rate Route Frequency Ordered Stop   02/26/18 1000  oseltamivir (TAMIFLU) capsule 30 mg     30 mg Oral 2 times daily  02/26/18 0640 03/01/18 2227   02/26/18 0000  cefTRIAXone (ROCEPHIN) 1 g in sodium chloride 0.9 % 100 mL IVPB  Status:  Discontinued     1 g 200 mL/hr over 30 Minutes Intravenous Every 24 hours 02/25/18 1056 02/25/18 1214   02/25/18 1530  oseltamivir (TAMIFLU) capsule 30 mg  Status:  Discontinued     30 mg Oral 2 times daily 02/25/18 1520 02/26/18 0640   02/25/18 1430  doxycycline (VIBRA-TABS) tablet 100 mg     100 mg Oral Every 12 hours 02/25/18 1417     02/25/18 1230  doxycycline (VIBRA-TABS) tablet 100 mg  Status:  Discontinued     100 mg Oral Every 12 hours 02/25/18 1056 02/25/18 1417   02/25/18 0745  cefTRIAXone (ROCEPHIN) 2 g in sodium chloride 0.9 % 100 mL IVPB     2 g 200 mL/hr over 30 Minutes Intravenous Every 24 hours 02/25/18 0733     02/25/18 0745  azithromycin (ZITHROMAX) 500 mg in sodium chloride 0.9 % 250 mL IVPB  Status:  Discontinued     500 mg 250 mL/hr over 60 Minutes Intravenous Every 24 hours 02/25/18 0733 02/25/18 1026      Subjective:   Coralie Common seen and examined today.  His breathing has improved.  Has occasional cough.  Denies current chest pain, abdominal pain, nausea or vomiting, diarrhea or constipation, dizziness or headache.  Objective:   Vitals:   03/02/18 0631 03/02/18 0723 03/02/18 0737 03/02/18 1123  BP: (!) 172/88 (!) 178/90 (!) 178/90 127/63  Pulse: 71 79 77 63  Resp: 16 15 20 17   Temp:  98.2 F (36.8 C)  98.5 F (36.9 C)  TempSrc:  Axillary  Oral  SpO2: 97% 95% 97%   Weight:      Height:        Intake/Output Summary (Last 24 hours) at 03/02/2018 1143 Last data filed at 03/02/2018 0932 Gross per 24 hour  Intake -  Output 525 ml  Net -525 ml   Filed Weights   02/25/18 0643  Weight: 70.8 kg   Exam  General: Well developed, well nourished, NAD, appears stated age  73: NCAT, mucous membranes moist.   Neck: Supple  Cardiovascular: S1 S2 auscultated, no murmur, RRR  Respiratory: Bilateral expiratory wheezing,  improving  Abdomen: Soft, nontender, nondistended, + bowel sounds  Extremities: warm dry without cyanosis clubbing or edema  Neuro: AAOx3, nonfocal  Psych: Pleasant, appropriate mood and affect  Data Reviewed: I have personally reviewed following labs and imaging studies  CBC: Recent Labs  Lab 02/25/18 0645 02/26/18 0330 02/27/18 0312 02/28/18 0309 03/01/18 0246  WBC 6.7 16.0* 14.0* 12.2* 13.1*  NEUTROABS 5.6  --   --   --   --   HGB 13.9 11.0* 10.4* 10.8* 10.8*  HCT 45.3 33.1* 32.3* 33.5* 33.6*  MCV 96.4 91.7 91.8 89.8 90.1  PLT 163 114* 108* 128* 671   Basic Metabolic Panel: Recent Labs  Lab 02/26/18 0330 02/27/18 0312 02/28/18 0309 03/01/18 0246 03/02/18 0250  NA 135 132* 131* 133* 131*  K 3.8 4.1 3.8 4.3 4.6  CL 105 104 102 103 103  CO2 20* 21* 20* 20* 21*  GLUCOSE 115* 103* 196* 215* 220*  BUN 21 16 20  30* 31*  CREATININE 1.07 1.03 0.98 1.11 0.97  CALCIUM 7.7* 8.1* 8.5* 8.8* 8.6*  MG 1.8  --  1.8 2.0 2.1   GFR: Estimated Creatinine Clearance: 54.7 mL/min (by C-G formula based on SCr of 0.97 mg/dL). Liver Function Tests: Recent Labs  Lab 02/25/18 0645  AST 24  ALT 21  ALKPHOS 91  BILITOT 0.6  PROT 6.6  ALBUMIN 3.5   No results for input(s): LIPASE, AMYLASE in the last 168 hours. No results for input(s): AMMONIA in the last 168 hours. Coagulation Profile: Recent Labs  Lab 02/25/18 0645  INR 1.16   Cardiac Enzymes: No results for input(s): CKTOTAL, CKMB, CKMBINDEX, TROPONINI in the last 168 hours. BNP (last 3 results) No results for input(s): PROBNP in the last 8760 hours. HbA1C: No results for input(s): HGBA1C in the last 72 hours. CBG: No results for input(s): GLUCAP in the last 168 hours. Lipid Profile: No results for input(s): CHOL, HDL, LDLCALC, TRIG, CHOLHDL, LDLDIRECT in the last 72 hours. Thyroid Function Tests: No results for input(s): TSH, T4TOTAL, FREET4, T3FREE, THYROIDAB in the last 72 hours. Anemia Panel: No results for  input(s): VITAMINB12, FOLATE, FERRITIN, TIBC, IRON, RETICCTPCT in the last 72 hours. Urine analysis:    Component Value Date/Time   COLORURINE YELLOW 02/25/2018 0700   APPEARANCEUR CLEAR 02/25/2018 0700   LABSPEC 1.011 02/25/2018 0700   PHURINE 5.0 02/25/2018 0700   GLUCOSEU NEGATIVE 02/25/2018 0700   HGBUR SMALL (A) 02/25/2018 0700   HGBUR trace-lysed 07/18/2009 1022   BILIRUBINUR NEGATIVE 02/25/2018 0700   BILIRUBINUR N 01/04/2017 1524   KETONESUR NEGATIVE 02/25/2018 0700   PROTEINUR NEGATIVE 02/25/2018 0700   UROBILINOGEN 0.2 01/04/2017 1524   UROBILINOGEN 0.2 07/18/2009 1022   NITRITE NEGATIVE 02/25/2018 0700   LEUKOCYTESUR NEGATIVE 02/25/2018 0700   Sepsis Labs: @LABRCNTIP (procalcitonin:4,lacticidven:4)  ) Recent Results (from the past 240 hour(s))  Culture, blood (Routine x 2)     Status: Abnormal   Collection Time: 02/25/18  6:45 AM  Result Value Ref Range Status   Specimen Description BLOOD BLOOD RIGHT FOREARM  Final   Special Requests   Final    BOTTLES DRAWN AEROBIC AND ANAEROBIC Blood Culture results may not be optimal due to an inadequate volume of blood received in culture bottles   Culture  Setup Time   Final    GRAM POSITIVE COCCI IN CLUSTERS AEROBIC BOTTLE ONLY CRITICAL RESULT CALLED TO, READ BACK BY AND VERIFIED WITH: PHARMD M PHAM [id number] MLM     Culture (A)  Final    MICROCOCCUS SPECIES Standardized susceptibility testing for this organism is not available. Performed at [facility], [place] [address], [place],  [address]    Report Status 02/27/2018 FINAL  Final  Blood Culture ID Panel (Reflexed)     Status: None   Collection Time: 02/25/18  6:45 AM  Result Value Ref Range Status   Enterococcus species NOT DETECTED NOT DETECTED Final   Listeria monocytogenes NOT DETECTED NOT DETECTED Final   Staphylococcus species NOT DETECTED NOT DETECTED Final   Staphylococcus aureus (BCID) NOT DETECTED NOT DETECTED Final   Streptococcus species NOT  DETECTED NOT DETECTED Final   Streptococcus agalactiae NOT DETECTED NOT DETECTED Final   Streptococcus pneumoniae NOT DETECTED NOT DETECTED Final   Streptococcus pyogenes NOT DETECTED NOT DETECTED Final   Acinetobacter baumannii NOT DETECTED NOT DETECTED Final   Enterobacteriaceae species NOT DETECTED NOT DETECTED Final   Enterobacter cloacae complex NOT DETECTED NOT DETECTED Final   Escherichia coli NOT DETECTED NOT DETECTED Final   Klebsiella oxytoca NOT DETECTED NOT DETECTED Final  Klebsiella pneumoniae NOT DETECTED NOT DETECTED Final   Proteus species NOT DETECTED NOT DETECTED Final   Serratia marcescens NOT DETECTED NOT DETECTED Final   Haemophilus influenzae NOT DETECTED NOT DETECTED Final   Neisseria meningitidis NOT DETECTED NOT DETECTED Final   Pseudomonas aeruginosa NOT DETECTED NOT DETECTED Final   Candida albicans NOT DETECTED NOT DETECTED Final   Candida glabrata NOT DETECTED NOT DETECTED Final   Candida krusei NOT DETECTED NOT DETECTED Final   Candida parapsilosis NOT DETECTED NOT DETECTED Final   Candida tropicalis NOT DETECTED NOT DETECTED Final    Comment: Performed at [facility], [place] [address], [place], [place] [address]  Culture, blood (Routine x 2)     Status: None   Collection Time: 02/25/18  7:52 AM  Result Value Ref Range Status   Specimen Description BLOOD RIGHT ANTECUBITAL  Final   Special Requests   Final    BOTTLES DRAWN AEROBIC AND ANAEROBIC Blood Culture adequate volume   Culture   Final    NO GROWTH 5 DAYS Performed at [facility], [place] [address] [place], [place] [address]    Report Status 03/02/2018 FINAL  Final  MRSA PCR Screening     Status: None   Collection Time: 02/25/18 12:01 PM  Result Value Ref Range Status   MRSA by PCR NEGATIVE NEGATIVE Final    Comment:        The GeneXpert MRSA Assay (FDA approved for NASAL specimens only), is one component of a comprehensive MRSA colonization surveillance program. It is  not intended to diagnose MRSA infection nor to guide or monitor treatment for MRSA infections. Performed at [facility], [place] [address], [place], [place] [address]   Expectorated sputum assessment w rflx to resp cult     Status: None   Collection Time: 02/25/18  5:23 PM  Result Value Ref Range Status   Specimen Description SPUTUM  Final   Special Requests NONE  Final   Sputum evaluation   Final    THIS SPECIMEN IS ACCEPTABLE FOR SPUTUM CULTURE Performed at [facility], [place] [address] [place], [place] [address]    Report Status 02/25/2018 FINAL  Final  Culture, respiratory     Status: None   Collection Time: 02/25/18  5:23 PM  Result Value Ref Range Status   Specimen Description SPUTUM  Final   Special Requests NONE Reflexed from [id number]  Final   Gram Stain   Final    MODERATE WBC PRESENT, PREDOMINANTLY PMN RARE SQUAMOUS EPITHELIAL CELLS PRESENT RARE GRAM POSITIVE COCCI RARE GRAM POSITIVE RODS Performed at [facility], [place] [address], [place], [place] [address]    Culture FEW Consistent with normal respiratory flora.  Final   Report Status 02/28/2018 FINAL  Final      Radiology Studies: No results found.   Scheduled Meds: . apixaban  5 mg Oral BID  . atorvastatin  20 mg Oral Daily  . benzonatate  100 mg Oral TID  . budesonide (PULMICORT) nebulizer solution  0.25 mg Nebulization BID  . buPROPion  150 mg Oral Daily  . cholecalciferol  2,000 Units Oral Daily  . diltiazem  30 mg Oral Q8H  . dofetilide  250 mcg Oral BID  . doxycycline  100 mg Oral Q12H  . fluticasone  1 spray Each Nare Daily  . guaiFENesin  600 mg Oral BID  . levalbuterol  1.25 mg Nebulization Q6H   And  . ipratropium  0.5 mg Nebulization Q6H  . loratadine  10 mg Oral Daily  . methylPREDNISolone (SOLU-MEDROL) injection  60 mg Intravenous Q12H  . metoprolol tartrate  25 mg Oral BID  . pantoprazole  20 mg Oral Daily  . tamsulosin  0.4 mg Oral Daily   Continuous Infusions: .  cefTRIAXone (ROCEPHIN)  IV 2 g (03/02/18 0745)  . sodium chloride       LOS: 5 days   Time Spent in minutes   30 minutes  Taneya Conkel D.O. on 03/02/2018 at 11:43 AM  Between 7am to 7pm - Please see pager noted on [email or web address]  After 7pm go to [email or web address]  And look for the night coverage person covering for me after hours  [facility] Office  [phone]

## 2018-03-03 LAB — BASIC METABOLIC PANEL
Anion gap: 7 (ref 5–15)
BUN: 35 mg/dL — ABNORMAL HIGH (ref 8–23)
CO2: 22 mmol/L (ref 22–32)
Calcium: 8.7 mg/dL — ABNORMAL LOW (ref 8.9–10.3)
Chloride: 103 mmol/L (ref 98–111)
Creatinine, Ser: 0.99 mg/dL (ref 0.61–1.24)
GFR calc Af Amer: >60 mL/min (ref >60)
GFR calc non Af Amer: >60 mL/min (ref >60)
Glucose, Bld: 204 mg/dL — ABNORMAL HIGH (ref 70–99)
POTASSIUM: 4.3 mmol/L (ref 3.5–5.1)
Sodium: 132 mmol/L — ABNORMAL LOW (ref 135–145)

## 2018-03-03 LAB — MAGNESIUM: Magnesium: 1.9 mg/dL (ref 1.7–2.4)

## 2018-03-03 MED ORDER — PREDNISONE 20 MG PO TABS: ORAL_TABLET | ORAL | 0 refills | Status: DC

## 2018-03-03 MED ORDER — METOPROLOL TARTRATE 25 MG PO TABS: 25.0000 mg | ORAL_TABLET | Freq: Two times a day (BID) | ORAL | 0 refills | Status: DC

## 2018-03-03 MED ORDER — BENZONATATE 100 MG PO CAPS: 100.0000 mg | ORAL_CAPSULE | Freq: Three times a day (TID) | ORAL | 0 refills | Status: DC

## 2018-03-03 MED ORDER — PANTOPRAZOLE SODIUM 20 MG PO TBEC: 20.0000 mg | DELAYED_RELEASE_TABLET | Freq: Every day | ORAL | 0 refills | Status: DC

## 2018-03-03 MED ORDER — GUAIFENESIN ER 600 MG PO TB12: 600.0000 mg | ORAL_TABLET | Freq: Two times a day (BID) | ORAL | 0 refills | Status: DC

## 2018-03-03 MED ORDER — DILTIAZEM HCL 30 MG PO TABS: 30.0000 mg | ORAL_TABLET | Freq: Three times a day (TID) | ORAL | 0 refills | Status: DC

## 2018-03-03 MED ORDER — GUAIFENESIN-DM 100-10 MG/5ML PO SYRP: 5.0000 mL | ORAL_SOLUTION | ORAL | 0 refills | Status: DC | PRN

## 2018-03-03 NOTE — Discharge Summary (Addendum)
Physician Discharge Summary  [name] Spence, MD PFX:[id number] DOB: [birth year] DOA: 02/25/2018  PCP: [name], MD  Admit date: 02/25/2018 Discharge date: 03/03/2018  Time spent: 45 minutes  Recommendations for Outpatient Follow-up:  Patient will be discharged to home with home health physical therapy.  Patient will need to follow up with primary care provider within one week of discharge.  Patient will need to follow-up with Dr. Lovena Le, cardiology, in 1 week.  Patient should continue medications as prescribed.  Patient should follow a heart healthy diet.   Discharge Diagnoses:  Principal Problem: Sepsis secondary to left lower lobe pneumonia and influenza B Influenza B Persistent atrial fibrillation with RVR Chronic diastolic heart failure Hyperlipidemia Essential hypertension CAD GERD Asthma exacerbation Anxiety  Discharge Condition: Stable  Diet recommendation: heart healthy  Filed Weights   02/25/18 0643  Weight: 70.8 kg    History of present illness:  on 02/25/2018 by Dr. 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 Patient reports nasal congestion, sore throat and cough which started last Monday and progressively worsened to shortness of breath/wheezing. This morning he was extremely short winded after using the bathroom and was brought in by family for further evaluation and management. According to wife (who is a retired Marine scientist), patient does not like to go to the hospital and was trying to self manageat home. Patient states he felt his asthma was flaring up. Both patient and wife deny history of fevers at home. Patient has chronic reflux and always has head and of the bed elevated to 45  degrees. He denies any leg swellings and does not use diuretics on a daily basis although he has Lasix 20 mg available to use as needed for history of diastolic CHF.  On presentation to the ED he was noted to have rectal temp of 329.9, systolic blood pressure in low 100s and labs showing lactate of 3.84 which improved to1.9after 500 mL of fluids. Chest x-ray suggestive of left lower lobe infiltrate. UA negative. Patient reports intolerance/allergy to aspirin, acetaminophen and ibuprofen. Hence he did not receive any treatment for fever and repeat oral temperature bedside during my evaluation was 97.5 F. He is requiring 2 L of nasal cannula to keep sats greater than 92%.  Hospital Course:  Sepsis secondary to left lower lobe pneumonia and influenza B -On admission, patient had fever, lactic acidosis, tachycardia, hypotension -Blood cultures sent 1/2 micrococcus species, likely contaminant -Patient placed on IV Rocephin- received one dose. Currently on ceftriaxone, doxycycline, Tamiflu -Sputum culture few consistent with normal respiratory flora -Urine strep pneumonia and Legionella antigens negative -was placed on Solu-Medrol for wheezing,, Pulmicort, flutter valve and nebulizer treatments -Patient has completed antibiotic course as well as course of Tamiflu -Will discharge with a prednisone taper  Influenza B -Continue Tamiflu, started on 02/25/2018, completed during hospitalization  Persistent atrial fibrillation with RVR -On admission, EKG showed prolonged QTC, 528. -Cardiology/EP consulted and appreciated -Continue Tikosyn per EP- likely to finish "loading" on 03/03/2018 -Heart rate better controlled today, currently in sinus rhythm  -Continue cardizem, metoprolol -Continue Eliquis -Patient noted to have increased QT, cardiology suspecting this could be due to azithromycin dose given on 02/25/2018.  -Dr. Lovena Le recommending continuing current medications with repeat EKG and  follow-up in 1 week. Discussed recent EKG  with Dr. Lovena Le- ok to discharge patient.  Chronic diastolic heart failure -Currently appears to be euvolemic and compensated -Patient was receiving IV fluids given his sepsis however these have been discontinued -Continue to monitor intake and output, daily weights  Hyperlipidemia -Continue statin  Essential hypertension -Continue metoprolol, cardizem -BP controlled  CAD -Status post OM PCI 2010 -Currently no chest pain  GERD -Continue PPI  Asthma exacerbation -Suspect secondary to pneumonia, influenza -Continue treatment plan as above  Anxiety -Continue Wellbutrin  Procedures: None  Consultations: Cardiology  Discharge Exam: Vitals:   03/03/18 0907 03/03/18 1057  BP: (!) 141/54 139/74  Pulse: 69 (!) 58  Resp:  16  Temp: (!) 97.5 F (36.4 C) 97.8 F (36.6 C)  SpO2: 98% 97%     General: Well developed, well nourished, NAD, appears stated age  HEENT: NCAT, PERRLA, EOMI, Anicteic Sclera, mucous membranes moist.  Neck: Supple, no JVD, no masses  Cardiovascular: S1 S2 auscultated, no rubs, murmurs or gallops. Regular rate and rhythm.  Respiratory: Clear to auscultation bilaterally with equal chest rise  Abdomen: Soft, nontender, nondistended, + bowel sounds  Extremities: warm dry without cyanosis clubbing or edema  Neuro: AAOx3, cranial nerves grossly intact. Strength 5/5 in patient's upper and lower extremities bilaterally  Skin: Without rashes exudates or nodules  Psych: Normal affect and demeanor with intact judgement and insight  Discharge Instructions Discharge Instructions    Discharge instructions   Complete by:  As directed    Patient will be discharged to home with home health physical therapy.  Patient will need to follow up with primary care provider within one week of discharge.  Patient will need to follow-up with Dr. Lovena Le, cardiology, in 1 week.  Patient should continue medications as  prescribed.  Patient should follow a heart healthy diet.     Allergies as of 03/03/2018      Reactions   Tylenol [acetaminophen] Rash   Advil [ibuprofen] Nausea Only, Rash   Asa [aspirin] Rash      Medication List    STOP taking these medications   atenolol 25 MG tablet Commonly known as:  TENORMIN   oxyCODONE 5 MG immediate release tablet Commonly known as:  ROXICODONE     TAKE these medications   albuterol 108 (90 Base) MCG/ACT inhaler Commonly known as:  PROVENTIL HFA;VENTOLIN HFA Inhale 1-2 puffs into the lungs every 6 (six) hours as needed for wheezing or shortness of breath.   apixaban 5 MG Tabs tablet Commonly known as:  ELIQUIS Take 1 tablet (5 mg total) by mouth 2 (two) times daily.   ARNUITY ELLIPTA 200 MCG/ACT Aepb Generic drug:  Fluticasone Furoate INHALE 1 PUFF INTO THE LUNGS DAILY What changed:  See the new instructions.   atorvastatin 20 MG tablet Commonly known as:  LIPITOR TAKE 1 TABLET(20 MG) BY MOUTH DAILY What changed:    how much to take  how to take this  when to take this  additional instructions   benzonatate 100 MG capsule Commonly known as:  TESSALON Take 1 capsule (100 mg total) by mouth 3 (three) times daily.   buPROPion 150 MG 12 hr tablet Commonly known as:  WELLBUTRIN SR Take 1 tablet (150 mg total) by mouth daily.   cetirizine 10 MG tablet Commonly known as:  ZYRTEC Take 10 mg by mouth daily with supper.   diltiazem 30 MG tablet Commonly known as:  CARDIZEM Take 1 tablet (30 mg total) by mouth every 8 (eight) hours. What changed:  how much to take  how to take this  when to take this  additional instructions   dofetilide 250 MCG capsule Commonly known as:  TIKOSYN TAKE 1 CAPSULE(250 MCG) BY MOUTH TWICE DAILY What changed:  See the new instructions.   guaiFENesin 600 MG 12 hr tablet Commonly known as:  MUCINEX Take 1 tablet (600 mg total) by mouth 2 (two) times daily.   guaiFENesin-dextromethorphan 100-10  MG/5ML syrup Commonly known as:  ROBITUSSIN DM Take 5 mLs by mouth every 4 (four) hours as needed for cough.   levalbuterol 45 MCG/ACT inhaler Commonly known as:  XOPENEX HFA Inhale 1 puff into the lungs every 4 (four) hours as needed for wheezing or shortness of breath.   LORazepam 1 MG tablet Commonly known as:  ATIVAN TAKE 1/2 TO 1 TABLET BY MOUTH EVERY NIGHT AT BEDTIME AS NEEDED What changed:    how much to take  how to take this  when to take this  additional instructions   metoprolol tartrate 25 MG tablet Commonly known as:  LOPRESSOR Take 1 tablet (25 mg total) by mouth 2 (two) times daily.   mometasone 50 MCG/ACT nasal spray Commonly known as:  NASONEX Place 1 spray into the nose daily as needed (for allergies).   nitroGLYCERIN 0.4 MG SL tablet Commonly known as:  NITROSTAT Place 1 tablet (0.4 mg total) under the tongue every 5 (five) minutes x 3 doses as needed for chest pain.   omeprazole 20 MG tablet Commonly known as:  PRILOSEC OTC Take 1 tablet (20 mg total) by mouth daily.   pantoprazole 20 MG tablet Commonly known as:  PROTONIX Take 1 tablet (20 mg total) by mouth daily. Start taking on:  March 04, 2018   predniSONE 20 MG tablet Commonly known as:  DELTASONE Take in the morning. Take 3 tabs x 3 days, then 2 tabs x 3 days, 1 day x 3 days.   tamsulosin 0.4 MG Caps capsule Commonly known as:  FLOMAX Take 1 capsule (0.4 mg total) by mouth daily.   traMADol 50 MG tablet Commonly known as:  ULTRAM Take 1 tablet (50 mg total) by mouth every 12 (twelve) hours as needed. What changed:  reasons to take this   Vitamin D 50 MCG (2000 UT) tablet Take 2,000 Units by mouth daily.      Allergies  Allergen Reactions  . Tylenol [Acetaminophen] Rash  . Advil [Ibuprofen] Nausea Only and Rash  . Asa [Aspirin] Rash   Follow-up Information    [facility] Follow up.   Specialty:  Cardiology Why:  03/10/2018 @ 10:30AM Contact  information: [address] [id number] mc [place]       [name], MD. Schedule an appointment as soon as possible for a visit in 1 week(s).   Specialty:  Family Medicine Why:  Hospital follow up       Evans Lance, MD. Schedule an appointment as soon as possible for a visit in 1 week(s).   Specialty:  Cardiology Why:  Hospital follow up Contact information: [address] [address] [phone]        [facility] Follow up.   Specialty:  Home Health Services Why:  physical therapist Contact information: [address] [place] [address] [phone]            The results of significant diagnostics from this hospitalization (including imaging,  microbiology, ancillary and laboratory) are listed below for reference.    Significant Diagnostic Studies: Dg Chest 2 View  Result Date: 02/25/2018 CLINICAL DATA:  Shortness of breath EXAM: CHEST - 2 VIEW COMPARISON:  April 02, 2016 FINDINGS: There is extensive airspace consolidation throughout much of the left lower lobe. Lungs elsewhere are clear. Heart size and pulmonary vascularity are normal. No adenopathy. There is aortic atherosclerosis. No evident bone lesions. There are surgical clips at the gastroesophageal junction. IMPRESSION: Extensive left lower lobe pneumonia. Lungs elsewhere clear. Heart size normal. There is aortic atherosclerosis. Aortic Atherosclerosis (ICD10-I70.0). Followup PA and lateral chest radiographs recommended in 3-4 weeks following trial of antibiotic therapy to ensure resolution and exclude underlying malignancy. Electronically Signed   By: Lowella Grip III M.D.   On: 02/25/2018 07:20    Microbiology: Recent Results (from the past 240 hour(s))  Culture, blood (Routine x 2)     Status: Abnormal   Collection Time: 02/25/18  6:45 AM  Result Value Ref Range Status    Specimen Description BLOOD BLOOD RIGHT FOREARM  Final   Special Requests   Final    BOTTLES DRAWN AEROBIC AND ANAEROBIC Blood Culture results may not be optimal due to an inadequate volume of blood received in culture bottles   Culture  Setup Time   Final    GRAM POSITIVE COCCI IN CLUSTERS AEROBIC BOTTLE ONLY CRITICAL RESULT CALLED TO, READ BACK BY AND VERIFIED WITH: PHARMD M PHAM [id number] MLM     Culture (A)  Final    MICROCOCCUS SPECIES Standardized susceptibility testing for this organism is not available. Performed at [facility], [place] [address] [place], [place] [address]    Report Status 02/27/2018 FINAL  Final  Blood Culture ID Panel (Reflexed)     Status: None   Collection Time: 02/25/18  6:45 AM  Result Value Ref Range Status   Enterococcus species NOT DETECTED NOT DETECTED Final   Listeria monocytogenes NOT DETECTED NOT DETECTED Final   Staphylococcus species NOT DETECTED NOT DETECTED Final   Staphylococcus aureus (BCID) NOT DETECTED NOT DETECTED Final   Streptococcus species NOT DETECTED NOT DETECTED Final   Streptococcus agalactiae NOT DETECTED NOT DETECTED Final   Streptococcus pneumoniae NOT DETECTED NOT DETECTED Final   Streptococcus pyogenes NOT DETECTED NOT DETECTED Final   Acinetobacter baumannii NOT DETECTED NOT DETECTED Final   Enterobacteriaceae species NOT DETECTED NOT DETECTED Final   Enterobacter cloacae complex NOT DETECTED NOT DETECTED Final   Escherichia coli NOT DETECTED NOT DETECTED Final   Klebsiella oxytoca NOT DETECTED NOT DETECTED Final   Klebsiella pneumoniae NOT DETECTED NOT DETECTED Final   Proteus species NOT DETECTED NOT DETECTED Final   Serratia marcescens NOT DETECTED NOT DETECTED Final   Haemophilus influenzae NOT DETECTED NOT DETECTED Final   Neisseria meningitidis NOT DETECTED NOT DETECTED Final   Pseudomonas aeruginosa NOT DETECTED NOT DETECTED Final   Candida albicans NOT DETECTED NOT DETECTED Final   Candida glabrata NOT  DETECTED NOT DETECTED Final   Candida krusei NOT DETECTED NOT DETECTED Final   Candida parapsilosis NOT DETECTED NOT DETECTED Final   Candida tropicalis NOT DETECTED NOT DETECTED Final    Comment: Performed at [facility], [place] [address] [place], [place] [address]  Culture, blood (Routine x 2)     Status: None   Collection Time: 02/25/18  7:52 AM  Result Value Ref Range Status   Specimen Description BLOOD RIGHT ANTECUBITAL  Final   Special Requests  Final    BOTTLES DRAWN AEROBIC AND ANAEROBIC Blood Culture adequate volume   Culture   Final    NO GROWTH 5 DAYS Performed at [facility], [place] [address] [place], [place] [address]    Report Status 03/02/2018 FINAL  Final  MRSA PCR Screening     Status: None   Collection Time: 02/25/18 12:01 PM  Result Value Ref Range Status   MRSA by PCR NEGATIVE NEGATIVE Final    Comment:        The GeneXpert MRSA Assay (FDA approved for NASAL specimens only), is one component of a comprehensive MRSA colonization surveillance program. It is not intended to diagnose MRSA infection nor to guide or monitor treatment for MRSA infections. Performed at [facility], [place] [address] [place], [place], [place] [address]   Expectorated sputum assessment w rflx to resp cult     Status: None   Collection Time: 02/25/18  5:23 PM  Result Value Ref Range Status   Specimen Description SPUTUM  Final   Special Requests NONE  Final   Sputum evaluation   Final    THIS SPECIMEN IS ACCEPTABLE FOR SPUTUM CULTURE Performed at [facility], [place] [address] [place], [place] [address]    Report Status 02/25/2018 FINAL  Final  Culture, respiratory     Status: None   Collection Time: 02/25/18  5:23 PM  Result Value Ref Range Status   Specimen Description SPUTUM  Final   Special Requests NONE Reflexed from [id number]  Final   Gram Stain   Final    MODERATE WBC PRESENT, PREDOMINANTLY PMN RARE SQUAMOUS EPITHELIAL CELLS PRESENT RARE GRAM POSITIVE  COCCI RARE GRAM POSITIVE RODS Performed at [facility], [place] [address], [place],  [address]    Culture FEW Consistent with normal respiratory flora.  Final   Report Status 02/28/2018 FINAL  Final     Labs: Basic Metabolic Panel: Recent Labs  Lab 02/26/18 0330 02/27/18 9150 02/28/18 0309 03/01/18 0246 03/02/18 0250 03/03/18 0218  NA 135 132* 131* 133* 131* 132*  K 3.8 4.1 3.8 4.3 4.6 4.3  CL 105 104 102 103 103 103  CO2 20* 21* 20* 20* 21* 22  GLUCOSE 115* 103* 196* 215* 220* 204*  BUN 21 16 20  30* 31* 35*  CREATININE 1.07 1.03 0.98 1.11 0.97 0.99  CALCIUM 7.7* 8.1* 8.5* 8.8* 8.6* 8.7*  MG 1.8  --  1.8 2.0 2.1 1.9   Liver Function Tests: Recent Labs  Lab 02/25/18 0645  AST 24  ALT 21  ALKPHOS 91  BILITOT 0.6  PROT 6.6  ALBUMIN 3.5   No results for input(s): LIPASE, AMYLASE in the last 168 hours. No results for input(s): AMMONIA in the last 168 hours. CBC: Recent Labs  Lab 02/25/18 0645 02/26/18 0330 02/27/18 0312 02/28/18 0309 03/01/18 0246  WBC 6.7 16.0* 14.0* 12.2* 13.1*  NEUTROABS 5.6  --   --   --   --   HGB 13.9 11.0* 10.4* 10.8* 10.8*  HCT 45.3 33.1* 32.3* 33.5* 33.6*  MCV 96.4 91.7 91.8 89.8 90.1  PLT 163 114* 108* 128* 160   Cardiac Enzymes: No results for input(s): CKTOTAL, CKMB, CKMBINDEX, TROPONINI in the last 168 hours. BNP: BNP (last 3 results) No results for input(s): BNP in the last 8760 hours.  ProBNP (last 3 results) No results for input(s): PROBNP in the last 8760 hours.  CBG: No results for input(s): GLUCAP in the last 168 hours.  Signed:  Cristal Ford  [facility] 03/03/2018, 1:44 PM

## 2018-03-03 NOTE — Progress Notes (Signed)
Progress Note  Patient Name: [name] Spence, MD Date of Encounter: 03/03/2018  Primary Cardiologist: Aundra Dubin   Subjective   No chest pain, palpitations or sob.   Inpatient Medications    Scheduled Meds: . apixaban  5 mg Oral BID  . atorvastatin  20 mg Oral Daily  . benzonatate  100 mg Oral TID  . budesonide (PULMICORT) nebulizer solution  0.25 mg Nebulization BID  . buPROPion  150 mg Oral Daily  . cholecalciferol  2,000 Units Oral Daily  . diltiazem  30 mg Oral Q8H  . dofetilide  250 mcg Oral BID  . doxycycline  100 mg Oral Q12H  . fluticasone  1 spray Each Nare Daily  . guaiFENesin  600 mg Oral BID  . levalbuterol  1.25 mg Nebulization Q6H   And  . ipratropium  0.5 mg Nebulization Q6H  . loratadine  10 mg Oral Daily  . methylPREDNISolone (SOLU-MEDROL) injection  60 mg Intravenous Q12H  . metoprolol tartrate  25 mg Oral BID  . pantoprazole  20 mg Oral Daily  . tamsulosin  0.4 mg Oral Daily   Continuous Infusions: . cefTRIAXone (ROCEPHIN)  IV 2 g (03/02/18 0745)  . sodium chloride     PRN Meds: guaiFENesin-dextromethorphan, levalbuterol, LORazepam, nitroGLYCERIN, oxyCODONE, polyethylene glycol, traMADol   Vital Signs    Vitals:   03/03/18 0323 03/03/18 0354 03/03/18 0743 03/03/18 0745  BP:  (!) 148/87 (!) 159/80 (!) 159/80  Pulse:  67 70 69  Resp:  17 17 16   Temp:  97.6 F (36.4 C) 97.7 F (36.5 C)   TempSrc:  Oral Oral   SpO2: 99% 99% 98% 98%  Weight:      Height:        Intake/Output Summary (Last 24 hours) at 03/03/2018 0825 Last data filed at 03/03/2018 0700 Gross per 24 hour  Intake 0 ml  Output 1275 ml  Net -1275 ml   Filed Weights   02/25/18 0643  Weight: 70.8 kg    Telemetry    nsr with PVC's - Personally Reviewed  ECG    NSR with QTC 490 - Personally Reviewed  Physical Exam   GEN: No acute distress.   Neck: 6 cm JVD Cardiac: RRR, no murmurs, rubs, or gallops.  Respiratory: Clear to auscultation bilaterally except for scattered  basilar rales on left and scant wheezes. GI: Soft, nontender, non-distended  MS: No edema; No deformity. Neuro:  Nonfocal  Psych: Normal affect   Labs    Chemistry Recent Labs  Lab 02/25/18 0645  03/01/18 0246 03/02/18 0250 03/03/18 0218  NA 138   < > 133* 131* 132*  K 4.5   < > 4.3 4.6 4.3  CL 102   < > 103 103 103  CO2 23   < > 20* 21* 22  GLUCOSE 148*   < > 215* 220* 204*  BUN 25*   < > 30* 31* 35*  CREATININE 1.27*   < > 1.11 0.97 0.99  CALCIUM 8.9   < > 8.8* 8.6* 8.7*  PROT 6.6  --   --   --   --   ALBUMIN 3.5  --   --   --   --   AST 24  --   --   --   --   ALT 21  --   --   --   --   ALKPHOS 91  --   --   --   --   BILITOT  0.6  --   --   --   --   GFRNONAA 51*   < > 60* >60 >60  GFRAA 59*   < > >60 >60 >60  ANIONGAP 13   < > 10 7 7    < > = values in this interval not displayed.     Hematology Recent Labs  Lab 02/27/18 0312 02/28/18 0309 03/01/18 0246  WBC 14.0* 12.2* 13.1*  RBC 3.52* 3.73* 3.73*  HGB 10.4* 10.8* 10.8*  HCT 32.3* 33.5* 33.6*  MCV 91.8 89.8 90.1  MCH 29.5 29.0 29.0  MCHC 32.2 32.2 32.1  RDW 13.4 13.0 13.2  PLT 108* 128* 160    Cardiac EnzymesNo results for input(s): TROPONINI in the last 168 hours.  Recent Labs  Lab 02/25/18 0656  TROPIPOC 0.00     BNPNo results for input(s): BNP, PROBNP in the last 168 hours.   DDimer No results for input(s): DDIMER in the last 168 hours.   Radiology    No results found.  Cardiac Studies   none  Patient Profile     85 y.o. male admitted with influenza and pneumonia and atrial fib with a RVR  Assessment & Plan    1. Persistent atrial fib - he is maintaining NSR. He got a single dose of azithromycin on admit with a long half life. His QT is a little long on dofetilide. As he has tolerated this dose for years, and as his creatinine is stable and the azithromycin will be several more days before it is completely washed out, I think he will be ok for DC provided that his morning ECG is ok.  He will need a followup ecg in our office next week, early in week.  2. Pneuomonia - he appears to be markedly improved. I will defer duration of anti-biotic therapy to his hospital team. Avoid QT prolonging drugs. 3. CAD - he denies anginal symptoms.  4. Coagulation - continue with the Eliquis.  [name] [name],M.D. [facility] will sign off.   Medication Recommendations:  See above Other recommendations (labs, testing, etc):  See above Follow up as an outpatient:  With me in 2-3 weeks.  For questions or updates, please contact [place] Please consult [email or web address] for contact info under Cardiology/STEMI.      Signed, [name] Peru, MD  03/03/2018, 8:25 AM  Patient ID: [name] Spence, MD, male   DOB: September 06, 1929, 85 y.o.   MRN: [id number]

## 2018-03-03 NOTE — Progress Notes (Signed)
Physical Therapy Treatment Patient Details Name: [name] ATIENZA, MD MRN: [id number] DOB: [birth year] Today's Date: 03/03/2018    History of Present Illness Pt adm with sepsis due to flu B and PNA. PMH - CVA, afib with rvr, CAD, chf, asthma    PT Comments    Pt admitted with above diagnosis. Pt currently with functional limitations due to balance and endurance deficits. Pt was able to ambulate with rollator with cues and min guard assist.  Pt will benefit from skilled PT to increase their independence and safety with mobility to allow discharge to the venue listed below.     Follow Up Recommendations  Home health PT     Equipment Recommendations  Other (comment)(rollator/pulse oximeter)    Recommendations for Other Services       Precautions / Restrictions Precautions Precautions: Fall    Mobility  Bed Mobility               General bed mobility comments: Pt up in chair  Transfers Overall transfer level: Needs assistance Equipment used: 4-wheeled walker Transfers: Sit to/from Stand Sit to Stand: Supervision         General transfer comment: assist for safety  Ambulation/Gait Ambulation/Gait assistance: Supervision Gait Distance (Feet): 250 Feet Assistive device: 4-wheeled walker Gait Pattern/deviations: Step-through pattern;Decreased stride length;Trunk flexed Gait velocity: decr Gait velocity interpretation: >2.62 ft/sec, indicative of community ambulatory General Gait Details: supervision for safety   Stairs             Wheelchair Mobility    Modified Rankin (Stroke Patients Only)       Balance Overall balance assessment: Needs assistance Sitting-balance support: No upper extremity supported;Feet supported Sitting balance-Leahy Scale: Good     Standing balance support: No upper extremity supported;During functional activity Standing balance-Leahy Scale: Fair                              Cognition Arousal/Alertness:  Awake/alert Behavior During Therapy: WFL for tasks assessed/performed Overall Cognitive Status: Within Functional Limits for tasks assessed                                        Exercises      General Comments        Pertinent Vitals/Pain Pain Assessment: No/denies pain    Home Living                      Prior Function            PT Goals (current goals can now be found in the care plan section) Acute Rehab PT Goals Patient Stated Goal: play golf  Progress towards PT goals: Progressing toward goals    Frequency    Min 3X/week      PT Plan Current plan remains appropriate    Co-evaluation              AM-PAC PT "6 Clicks" Mobility   Outcome Measure  Help needed turning from your back to your side while in a flat bed without using bedrails?: A Little Help needed moving from lying on your back to sitting on the side of a flat bed without using bedrails?: A Little Help needed moving to and from a bed to a chair (including a wheelchair)?: A Little Help needed standing up from a chair using your  arms (e.g., wheelchair or bedside chair)?: A Little Help needed to walk in hospital room?: A Little Help needed climbing 3-5 steps with a railing? : A Little 6 Click Score: 18    End of Session Equipment Utilized During Treatment: Gait belt Activity Tolerance: Patient tolerated treatment well Patient left: in chair;with call bell/phone within reach;with family/visitor present Nurse Communication: Mobility status PT Visit Diagnosis: Unsteadiness on feet (R26.81);Muscle weakness (generalized) (M62.81)     Time: 5947-0761 PT Time Calculation (min) (ACUTE ONLY): 17 min  Charges:  $Gait Training: 8-22 mins                     [place] Pager:  [phone]  Office:  [place] 03/03/2018, 3:28 PM

## 2018-03-03 NOTE — Care Management Note (Addendum)
Case Management Note  Patient Details  Name: [name] CORPUS, MD MRN: [id number] Date of Birth: 14-Jan-1930  Subjective/Objective:  Pt admitted with PNA and flu                  Action/Plan:   PTA independent from home with wife.  Pt has PCP and is able to afford prescriptions.   Wife purchased rollator private pay.   CM gave pt Medicare.gov HH list - pt chose [facility] - agency accepted   Expected Discharge Date:  03/03/18               Expected Discharge Plan:  Home/Self Care(Independent from home with wife)  In-House Referral:     Discharge planning Services  CM Consult  Post Acute Care Choice:    Choice offered to:  Patient  DME Arranged:    DME Agency:     HH Arranged:  PT [place]:  [facility]  Status of Service:  Completed, signed off  If discussed at [place] of Stay Meetings, dates discussed:    Additional Comments:  Maryclare Labrador, RN 03/03/2018, 12:26 PM

## 2018-03-03 NOTE — Discharge Instructions (Signed)
Community-Acquired Pneumonia, Adult °Pneumonia is an infection of the lungs. It causes swelling in the airways of the lungs. Mucus and fluid may also build up inside the airways. °One type of pneumonia can happen while a person is in a hospital. A different type can happen when a person is not in a hospital (community-acquired pneumonia).  °What are the causes? ° °This condition is caused by germs (viruses, bacteria, or fungi). Some types of germs can be passed from one person to another. This can happen when you breathe in droplets from the cough or sneeze of an infected person. °What increases the risk? °You are more likely to develop this condition if you: °· Have a long-term (chronic) disease, such as: °? Chronic obstructive pulmonary disease (COPD). °? Asthma. °? Cystic fibrosis. °? Congestive heart failure. °? Diabetes. °? Kidney disease. °· Have HIV. °· Have sickle cell disease. °· Have had your spleen removed. °· Do not take good care of your teeth and mouth (poor dental hygiene). °· Have a medical condition that increases the risk of breathing in droplets from your own mouth and nose. °· Have a weakened body defense system (immune system). °· Are a smoker. °· Travel to areas where the germs that cause this illness are common. °· Are around certain animals or the places they live. °What are the signs or symptoms? °· A dry cough. °· A wet (productive) cough. °· Fever. °· Sweating. °· Chest pain. This often happens when breathing deeply or coughing. °· Fast breathing or trouble breathing. °· Shortness of breath. °· Shaking chills. °· Feeling tired (fatigue). °· Muscle aches. °How is this treated? °Treatment for this condition depends on many things. Most adults can be treated at home. In some cases, treatment must happen in a hospital. Treatment may include: °· Medicines given by mouth or through an IV tube. °· Being given extra oxygen. °· Respiratory therapy. °In rare cases, treatment for very bad pneumonia  may include: °· Using a machine to help you breathe. °· Having a procedure to remove fluid from around your lungs. °Follow these instructions at home: °Medicines °· Take over-the-counter and prescription medicines only as told by your doctor. °? Only take cough medicine if you are losing sleep. °· If you were prescribed an antibiotic medicine, take it as told by your doctor. Do not stop taking the antibiotic even if you start to feel better. °General instructions ° °· Sleep with your head and neck raised (elevated). You can do this by sleeping in a recliner or by putting a few pillows under your head. °· Rest as needed. Get at least 8 hours of sleep each night. °· Drink enough water to keep your pee (urine) pale yellow. °· Eat a healthy diet that includes plenty of vegetables, fruits, whole grains, low-fat dairy products, and lean protein. °· Do not use any products that contain nicotine or tobacco. These include cigarettes, e-cigarettes, and chewing tobacco. If you need help quitting, ask your doctor. °· Keep all follow-up visits as told by your doctor. This is important. °How is this prevented? °A shot (vaccine) can help prevent pneumonia. Shots are often suggested for: °· People older than 83 years of age. °· People older than 83 years of age who: °? Are having cancer treatment. °? Have long-term (chronic) lung disease. °? Have problems with their body's defense system. °You may also prevent pneumonia if you take these actions: °· Get the flu (influenza) shot every year. °· Go to the dentist as   often as told.  Wash your hands often. If you cannot use soap and water, use hand sanitizer. Contact a doctor if:  You have a fever.  You lose sleep because your cough medicine does not help. Get help right away if:  You are short of breath and it gets worse.  You have more chest pain.  Your sickness gets worse. This is very serious if: ? You are an older adult. ? Your body's defense system is weak.  You  cough up blood. Summary  Pneumonia is an infection of the lungs.  Most adults can be treated at home. Some will need treatment in a hospital.  Drink enough water to keep your pee pale yellow.  Get at least 8 hours of sleep each night. This information is not intended to replace advice given to you by your health care provider. Make sure you discuss any questions you have with your health care provider. Document Released: 07/14/2007 Document Revised: 09/22/2017 Document Reviewed: 09/22/2017 Elsevier Interactive Patient Education  2019 [employer]  Influenza, Adult Influenza is also called "the flu." It is an infection in the lungs, nose, and throat (respiratory tract). It is caused by a virus. The flu causes symptoms that are similar to symptoms of a cold. It also causes a high fever and body aches. The flu spreads easily from person to person (is contagious). Getting a flu shot (influenza vaccination) every year is the best way to prevent the flu. What are the causes? This condition is caused by the influenza virus. You can get the virus by:  Breathing in droplets that are in the air from the cough or sneeze of a person who has the virus.  Touching something that has the virus on it (is contaminated) and then touching your mouth, nose, or eyes. What increases the risk? Certain things may make you more likely to get the flu. These include:  Not washing your hands often.  Having close contact with many people during cold and flu season.  Touching your mouth, eyes, or nose without first washing your hands.  Not getting a flu shot every year. You may have a higher risk for the flu, along with serious problems such as a lung infection (pneumonia), if you:  Are older than 65.  Are pregnant.  Have a weakened disease-fighting system (immune system) because of a disease or taking certain medicines.  Have a long-term (chronic) illness, such as: ? Heart, kidney, or lung  disease. ? Diabetes. ? Asthma.  Have a liver disorder.  Are very overweight (morbidly obese).  Have anemia. This is a condition that affects your red blood cells. What are the signs or symptoms? Symptoms usually begin suddenly and last 4-14 days. They may include:  Fever and chills.  Headaches, body aches, or muscle aches.  Sore throat.  Cough.  Runny or stuffy (congested) nose.  Chest discomfort.  Not wanting to eat as much as normal (poor appetite).  Weakness or feeling tired (fatigue).  Dizziness.  Feeling sick to your stomach (nauseous) or throwing up (vomiting). How is this treated? If the flu is found early, you can be treated with medicine that can help reduce how bad the illness is and how long it lasts (antiviral medicine). This may be given by mouth (orally) or through an IV tube. Taking care of yourself at home can help your symptoms get better. Your doctor may suggest:  Taking over-the-counter medicines.  Drinking plenty of fluids. The flu often goes away on  its own. If you have very bad symptoms or other problems, you may be treated in a hospital. Follow these instructions at home:     Activity  Rest as needed. Get plenty of sleep.  Stay home from work or school as told by your doctor. ? Do not leave home until you do not have a fever for 24 hours without taking medicine. ? Leave home only to visit your doctor. Eating and drinking  Take an ORS (oral rehydration solution). This is a drink that is sold at pharmacies and stores.  Drink enough fluid to keep your pee (urine) pale yellow.  Drink clear fluids in small amounts as you are able. Clear fluids include: ? Water. ? Ice chips. ? Fruit juice that has water added (diluted fruit juice). ? Low-calorie sports drinks.  Eat bland, easy-to-digest foods in small amounts as you are able. These foods include: ? Bananas. ? Applesauce. ? Rice. ? Lean meats. ? Toast. ? Crackers.  Do not eat or  drink: ? Fluids that have a lot of sugar or caffeine. ? Alcohol. ? Spicy or fatty foods. General instructions  Take over-the-counter and prescription medicines only as told by your doctor.  Use a cool mist humidifier to add moisture to the air in your home. This can make it easier for you to breathe.  Cover your mouth and nose when you cough or sneeze.  Wash your hands with soap and water often, especially after you cough or sneeze. If you cannot use soap and water, use alcohol-based hand sanitizer.  Keep all follow-up visits as told by your doctor. This is important. How is this prevented?   Get a flu shot every year. You may get the flu shot in late summer, fall, or winter. Ask your doctor when you should get your flu shot.  Avoid contact with people who are sick during fall and winter (cold and flu season). Contact a doctor if:  You get new symptoms.  You have: ? Chest pain. ? Watery poop (diarrhea). ? A fever.  Your cough gets worse.  You start to have more mucus.  You feel sick to your stomach.  You throw up. Get help right away if you:  Have shortness of breath.  Have trouble breathing.  Have skin or nails that turn a bluish color.  Have very bad pain or stiffness in your neck.  Get a sudden headache.  Get sudden pain in your face or ear.  Cannot eat or drink without throwing up. Summary  Influenza ("the flu") is an infection in the lungs, nose, and throat. It is caused by a virus.  Take over-the-counter and prescription medicines only as told by your doctor.  Getting a flu shot every year is the best way to avoid getting the flu. This information is not intended to replace advice given to you by your health care provider. Make sure you discuss any questions you have with your health care provider. Document Released: 11/04/2007 Document Revised: 07/13/2017 Document Reviewed: 07/13/2017 Elsevier Interactive Patient Education  2019 [employer].   Atrial Fibrillation  Atrial fibrillation is a type of heartbeat that is irregular or fast (rapid). If you have this condition, your heart beats without any order. This makes it hard for your heart to pump blood in a normal way. Having this condition gives you more risk for stroke, heart failure, and other heart problems. Atrial fibrillation may start all of a sudden and then stop on its own, or it  may become a long-lasting problem. What are the causes? This condition may be caused by heart conditions, such as:  High blood pressure.  Heart failure.  Heart valve disease.  Heart surgery. Other causes include:  Pneumonia.  Obstructive sleep apnea.  Lung cancer.  Thyroid disease.  Drinking too much alcohol. Sometimes the cause is not known. What increases the risk? You are more likely to develop this condition if:  You smoke.  You are older.  You have diabetes.  You are overweight.  You have a family history of this condition.  You exercise often and hard. What are the signs or symptoms? Common symptoms of this condition include:  A feeling like your heart is beating very fast.  Chest pain.  Feeling short of breath.  Feeling light-headed or weak.  Getting tired easily. Follow these instructions at home: Medicines  Take over-the-counter and prescription medicines only as told by your doctor.  If your doctor gives you a blood-thinning medicine, take it exactly as told. Taking too much of it can cause bleeding. Taking too little of it does not protect you against clots. Clots can cause a stroke. Lifestyle      Do not use any tobacco products. These include cigarettes, chewing tobacco, and e-cigarettes. If you need help quitting, ask your doctor.  Do not drink alcohol.  Do not drink beverages that have caffeine. These include coffee, soda, and tea.  Follow diet instructions as told by your doctor.  Exercise regularly as told by your  doctor. General instructions  If you have a condition that causes breathing to stop for a short period of time (apnea), treat it as told by your doctor.  Keep a healthy weight. Do not use diet pills unless your doctor says they are safe for you. Diet pills may make heart problems worse.  Keep all follow-up visits as told by your doctor. This is important. Contact a doctor if:  You notice a change in the speed, rhythm, or strength of your heartbeat.  You are taking a blood-thinning medicine and you see more bruising.  You get tired more easily when you move or exercise.  You have a sudden change in weight. Get help right away if:   You have pain in your chest or your belly (abdomen).  You have trouble breathing.  You have blood in your vomit, poop, or pee (urine).  You have any signs of a stroke. "BE FAST" is an easy way to remember the main warning signs: ? B - Balance. Signs are dizziness, sudden trouble walking, or loss of balance. ? E - Eyes. Signs are trouble seeing or a change in how you see. ? F - Face. Signs are sudden weakness or loss of feeling in the face, or the face or eyelid drooping on one side. ? A - Arms. Signs are weakness or loss of feeling in an arm. This happens suddenly and usually on one side of the body. ? S - Speech. Signs are sudden trouble speaking, slurred speech, or trouble understanding what people say. ? T - Time. Time to call emergency services. Write down what time symptoms started.  You have other signs of a stroke, such as: ? A sudden, very bad headache with no known cause. ? Feeling sick to your stomach (nausea). ? Throwing up (vomiting). ? Jerky movements you cannot control (seizure). These symptoms may be an emergency. Do not wait to see if the symptoms will go away. Get medical help right away. Call  your local emergency services (911 in the [place] Do not drive yourself to the hospital. Summary  Atrial fibrillation is a type of heartbeat  that is irregular or fast (rapid).  You are at higher risk of this condition if you smoke, are older, have diabetes, or are overweight.  Follow your doctor's instructions about medicines, diet, exercise, and follow-up visits.  Get help right away if you think that you have signs of a stroke. This information is not intended to replace advice given to you by your health care provider. Make sure you discuss any questions you have with your health care provider. Document Released: 11/04/2007 Document Revised: 03/18/2017 Document Reviewed: 03/18/2017 Elsevier Interactive Patient Education  2019 [employer].

## 2018-03-03 NOTE — Plan of Care (Signed)

## 2018-03-03 NOTE — Progress Notes (Signed)
Post dose EKG is reviewed by Dr. Lovena Le, QT is OK, and cleared from EP perspective for discharge on Tikosyn 238mcg BID with his Eliquis. 1 week follow up with the Afib clinic is in place   [place], [place]

## 2018-03-03 NOTE — Progress Notes (Signed)
CARDIAC REHAB PHASE I   PRE:  Rate/Rhythm: 72 SR with PVCs  BP:  Sitting: 142/73      SaO2: 95 RA  MODE:  Ambulation: 370 ft   POST:  Rate/Rhythm: 75 SR with PVCs  BP:  Sitting: 150/81    SaO2: 94 RA   Pt ambulated 360ft in hallway standby assist with rollator. Pt c/o slight SOB at end of walk, but recovered quickly. Pt to recliner, call bell and phone within reach.   [id number] Rufina Falco, RN BSN 03/03/2018 10:07 AM

## 2018-03-04 DIAGNOSIS — Z9181 History of falling: Secondary | ICD-10-CM | POA: Diagnosis not present

## 2018-03-04 DIAGNOSIS — I5032 Chronic diastolic (congestive) heart failure: Secondary | ICD-10-CM | POA: Diagnosis not present

## 2018-03-04 DIAGNOSIS — M1990 Unspecified osteoarthritis, unspecified site: Secondary | ICD-10-CM | POA: Diagnosis not present

## 2018-03-04 DIAGNOSIS — Z7901 Long term (current) use of anticoagulants: Secondary | ICD-10-CM | POA: Diagnosis not present

## 2018-03-04 DIAGNOSIS — I272 Pulmonary hypertension, unspecified: Secondary | ICD-10-CM | POA: Diagnosis not present

## 2018-03-04 DIAGNOSIS — N400 Enlarged prostate without lower urinary tract symptoms: Secondary | ICD-10-CM | POA: Diagnosis not present

## 2018-03-04 DIAGNOSIS — I480 Paroxysmal atrial fibrillation: Secondary | ICD-10-CM | POA: Diagnosis not present

## 2018-03-04 DIAGNOSIS — Z8701 Personal history of pneumonia (recurrent): Secondary | ICD-10-CM | POA: Diagnosis not present

## 2018-03-04 DIAGNOSIS — F329 Major depressive disorder, single episode, unspecified: Secondary | ICD-10-CM | POA: Diagnosis not present

## 2018-03-04 DIAGNOSIS — K219 Gastro-esophageal reflux disease without esophagitis: Secondary | ICD-10-CM | POA: Diagnosis not present

## 2018-03-04 DIAGNOSIS — Z8673 Personal history of transient ischemic attack (TIA), and cerebral infarction without residual deficits: Secondary | ICD-10-CM | POA: Diagnosis not present

## 2018-03-04 DIAGNOSIS — I2510 Atherosclerotic heart disease of native coronary artery without angina pectoris: Secondary | ICD-10-CM | POA: Diagnosis not present

## 2018-03-04 DIAGNOSIS — M6281 Muscle weakness (generalized): Secondary | ICD-10-CM | POA: Diagnosis not present

## 2018-03-04 DIAGNOSIS — I110 Hypertensive heart disease with heart failure: Secondary | ICD-10-CM | POA: Diagnosis not present

## 2018-03-04 DIAGNOSIS — F419 Anxiety disorder, unspecified: Secondary | ICD-10-CM | POA: Diagnosis not present

## 2018-03-04 DIAGNOSIS — J45901 Unspecified asthma with (acute) exacerbation: Secondary | ICD-10-CM | POA: Diagnosis not present

## 2018-03-06 ENCOUNTER — Telehealth: Payer: Self-pay | Admitting: Family Medicine

## 2018-03-06 ENCOUNTER — Telehealth: Payer: Self-pay

## 2018-03-06 NOTE — Telephone Encounter (Signed)
Author phoned pt. To set up AWV. Pt. Agreed to come in prior to 1/29 appointment with Dr. Elease Hashimoto.

## 2018-03-06 NOTE — Telephone Encounter (Signed)
He needs hospital follow up this week.  OK to approve requested services.

## 2018-03-06 NOTE — Telephone Encounter (Signed)
Copied from [place] [id number]. Topic: Quick Communication - Home Health Verbal Orders >> Mar 06, 2018 11:47 AM Bea Graff, NT wrote: Caller/Agency: [facility] Number: [phone] Requesting OT/PT/Skilled Nursing/Social Work: PT Frequency: 1 time a week for 1 week, 2 times a week for 2 weeks, and 1 time a week for 1 week. Effective 03/04/2018

## 2018-03-06 NOTE — Telephone Encounter (Signed)
Pt has upcoming appt with Dr Elease Hashimoto on 03/08/2018 Please advise

## 2018-03-06 NOTE — Telephone Encounter (Signed)
Called [facility] and spoke to [place] and gave her the verbal OK per Dr. Elease Hashimoto for the PT. Patient has an appointment on 03/08/18.

## 2018-03-06 NOTE — Telephone Encounter (Signed)
Please see message.  Please advise. 

## 2018-03-07 ENCOUNTER — Ambulatory Visit: Payer: PPO | Admitting: Internal Medicine

## 2018-03-07 ENCOUNTER — Encounter: Payer: Self-pay | Admitting: Internal Medicine

## 2018-03-07 VITALS — BP 130/82 | HR 58 | Ht 70.5 in | Wt 165.0 lb

## 2018-03-07 DIAGNOSIS — I480 Paroxysmal atrial fibrillation: Secondary | ICD-10-CM | POA: Diagnosis not present

## 2018-03-07 NOTE — Patient Instructions (Addendum)
Medication Instructions:  Your physician has recommended you make the following change in your medication:   1.  STOP taking ATENOLOL  2.  STOP taking CARDIZEM  3.  CONTINUE taking metoprolol as ordered   Labwork: You will get lab work today;  BMP and magnesium  Testing/Procedures: None ordered.  Follow-Up:  RESCHEDULE afib clinic office visit for 03/10/2018 for 4 WEEKS from [place] wants you to follow-up in: 4 months with Dr. Lovena Le.-  Office visit      Any Other Special Instructions Will Be Listed Below (If Applicable).  If you need a refill on your cardiac medications before your next appointment, please call your pharmacy.

## 2018-03-07 NOTE — Progress Notes (Signed)
HPI Dr. Caryn Section returns today for evaluation of PAF. 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 He was hospitalized about a week ago with pneumonia after influenza and was treated with iv antibiotics. He has improved. He developed atrial fib and we initially held his dofetilide because he was given a single dose of azythromycin and his QT was prolonged. After 3 days the dofetilide was restarted and he went back to NSR. He has done well in the interim. He denies palpitations, chest pain, or syncope.  Allergies  Allergen Reactions  . Tylenol [Acetaminophen] Rash  . Advil [Ibuprofen] Nausea Only and Rash  . Asa [Aspirin] Rash     Current Outpatient Medications  Medication Sig Dispense Refill  . apixaban (ELIQUIS) 5 MG TABS tablet Take 1 tablet (5 mg total) by mouth 2 (two) times daily. 180 tablet 3  . ARNUITY ELLIPTA 200 MCG/ACT AEPB INHALE 1 PUFF INTO THE LUNGS DAILY 30 each 6  . atorvastatin (LIPITOR) 20 MG tablet TAKE 1 TABLET(20 MG) BY MOUTH DAILY 90 tablet 4  . benzonatate (TESSALON) 100 MG capsule Take 1 capsule (100 mg total) by mouth 3 (three) times daily. 20 capsule 0  . buPROPion (WELLBUTRIN SR) 150 MG 12 hr tablet Take 1 tablet (150 mg total) by mouth daily. 100 tablet 4  . cetirizine (ZYRTEC) 10 MG tablet Take 10 mg by mouth daily with supper.     . Cholecalciferol (VITAMIN D) 2000 units tablet Take 2,000 Units by mouth daily.    Marland Kitchen dofetilide (TIKOSYN) 250 MCG capsule TAKE 1 CAPSULE(250 MCG) BY MOUTH TWICE DAILY 180 capsule 0  . guaiFENesin (MUCINEX) 600 MG 12 hr tablet Take 1 tablet (600 mg total) by mouth 2 (two) times daily. 14 tablet 0  . LORazepam (ATIVAN) 1 MG tablet TAKE 1/2 TO 1 TABLET BY MOUTH EVERY NIGHT AT BEDTIME AS NEEDED 90 tablet 1  . metoprolol tartrate (LOPRESSOR) 25 MG tablet Take 1 tablet (25 mg total) by mouth 2 (two) times daily. 60 tablet 0  . mometasone (NASONEX) 50 MCG/ACT nasal spray Place 1 spray into the nose daily as needed (for  allergies).     . nitroGLYCERIN (NITROSTAT) 0.4 MG SL tablet Place 1 tablet (0.4 mg total) under the tongue every 5 (five) minutes x 3 doses as needed for chest pain. 25 tablet 2  . omeprazole (PRILOSEC OTC) 20 MG tablet Take 1 tablet (20 mg total) by mouth daily. 100 tablet 4  . pantoprazole (PROTONIX) 20 MG tablet Take 1 tablet (20 mg total) by mouth daily. 30 tablet 0  . predniSONE (DELTASONE) 20 MG tablet Take in the morning. Take 3 tabs x 3 days, then 2 tabs x 3 days, 1 day x 3 days. 18 tablet 0  . tamsulosin (FLOMAX) 0.4 MG CAPS capsule Take 1 capsule (0.4 mg total) by mouth daily. 90 capsule 4  . traMADol (ULTRAM) 50 MG tablet Take 1 tablet (50 mg total) by mouth every 12 (twelve) hours as needed. 120 tablet 5   No current facility-administered medications for this visit.      Past Medical History:  Diagnosis Date  . Allergic rhinitis   . Anxiety   . Asthma   . Atrial fibrillation ([place])   . CAD (coronary artery disease)   . Depression   . Diverticulosis 03/16/1995  . DJD (degenerative joint disease)    "most joints" (03/11/2015)  . GERD (gastroesophageal reflux disease) 12/30/2000  . Heart attack (  [place]) 1984  . Hiatal hernia 12/30/2000  . History of blood transfusion 1988   "related to GI bleeding OR"  . History of duodenal ulcer 10/15/1986  . Hyperlipidemia   . Hypertension   . Sinus bradycardia   . Stroke ([place])     ROS:   All systems reviewed and negative except as noted in the HPI.   Past Surgical History:  Procedure Laterality Date  . CARDIAC CATHETERIZATION    . CATARACT EXTRACTION W/ INTRAOCULAR LENS  IMPLANT, BILATERAL Bilateral ?2013  . CHOLECYSTECTOMY OPEN  1989  . CORONARY ANGIOPLASTY WITH STENT PLACEMENT  2010  . hospital ccu  1984   heart attack, hypoplastic right coronary  . INGUINAL HERNIA REPAIR Right ~ 1990  . NASAL SEPTUM SURGERY  1972  . ORCHIECTOMY Bilateral 06/10/2017   Procedure: BILATERAL ORCHIECTOMY;  Surgeon: Franchot Gallo, MD;  Location:  [facility];  Service: Urology;  Laterality: Bilateral;  MAC ANESTHESIA AND LOCAL  . TONSILLECTOMY    . TOTAL KNEE ARTHROPLASTY Bilateral 1998-2004   "right-left"  . VAGOTOMY AND PYLOROPLASTY  1988   "bleeding duodenal ulcer"     Family History  Problem Relation Age of Onset  . Coronary artery disease Mother        deceased  . Deep vein thrombosis Sister   . Coronary artery disease Brother   . Asthma Brother   . Colon cancer Neg Hx      Social History   Socioeconomic History  . Marital status: Married    Spouse name: Not on file  . Number of children: 2  . Years of education: Not on file  . Highest education level: Not on file  Occupational History  . Occupation: IT sales professional: RETIRED  Social Needs  . Financial resource strain: Not on file  . Food insecurity:    Worry: Not on file    Inability: Not on file  . Transportation needs:    Medical: Not on file    Non-medical: Not on file  Tobacco Use  . Smoking status: Never Smoker  . Smokeless tobacco: Never Used  Substance and Sexual Activity  . Alcohol use: No  . Drug use: No  . Sexual activity: Not Currently  Lifestyle  . Physical activity:    Days per week: Not on file    Minutes per session: Not on file  . Stress: Not on file  Relationships  . Social connections:    Talks on phone: Not on file    Gets together: Not on file    Attends religious service: Not on file    Active member of club or organization: Not on file    Attends meetings of clubs or organizations: Not on file    Relationship status: Not on file  . Intimate partner violence:    Fear of current or ex partner: Not on file    Emotionally abused: Not on file    Physically abused: Not on file    Forced sexual activity: Not on file  Other Topics Concern  . Not on file  Social History Narrative   Married. Regular exercise - yes.      BP 130/82   Pulse (!) 58   Ht 5' 10.5" (1.791 m)   Wt 165 lb (74.8 kg)   SpO2 97%   BMI 23.34 kg/m     Physical Exam:  Well appearing NAD HEENT: Unremarkable Neck:  6 cm JVD, no thyromegally Lymphatics:  No adenopathy Back:  No  CVA tenderness Lungs:  Clear with no wheezes HEART:  Regular rate rhythm, no murmurs, no rubs, no clicks Abd:  soft, positive bowel sounds, no organomegally, no rebound, no guarding Ext:  2 plus pulses, no edema, no cyanosis, no clubbing Skin:  No rashes no nodules Neuro:  CN II through XII intact, motor grossly intact  EKG - nsr with QTC 480 by me   Assess/Plan: 1. PAF - he appears to be maintaining NSR. He will continue dofetilide for now.  We will check potassium, Mg, and renal function today. He will stop atenolol and cardizem and take toprol 25 mg daily. 2. CAD - he denies any anginal symptoms and we will follow. 3. Chronic diastolic heart failure - he is class 2. He will maintain a low sodium diet. 4. Pneumonia - I would anticipate he obtain a repeat cxr in about 4-6 weeks. He is off his anti-biotics.

## 2018-03-07 NOTE — Progress Notes (Addendum)
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  Review of Systems:  No ROS.  Medicare Wellness Visit. Additional risk factors are reflected in the social history.  Cardiac Risk Factors include: advanced age (>59mn, >>82women);male gender;dyslipidemia Sleep patterns: has interrupted sleep, feels rested on waking and gets up 4 times nightly to void.    Home Safety/Smoke Alarms: Feels safe in home. Smoke alarms in place. Lives on first level of 3 story home with wife. Living environment; residence and Firearm Safety: Has rolling walker prn s/p hospitalization. Falls prevention discussed. Seat Belt Safety/Bike Helmet: Wears seat belt.   Male:   CCS- N/A as pt over 80yo    PSA- hx orchiectomy. Lab Results  Component Value Date   PSA 0.35 10/07/2017   PSA 22.70 05/03/2017   PSA 6.18 (H) 12/02/2014       Objective:    Vitals: BP 136/66 (BP Location: Right Arm, Patient Position: Sitting, Cuff Size: Normal)   Pulse 60   Temp 97.6 F (36.4 C)   Resp 18   Ht 5' 10"  (1.778 m)   Wt 163 lb (73.9 kg)   SpO2 98%   BMI 23.39 kg/m   Body mass index is 23.39 kg/m.  Advanced Directives 03/08/2018 02/27/2018 02/25/2018 06/10/2017 06/08/2017 02/11/2016 12/29/2015  Does Patient Have a Medical Advance Directive? Yes Yes Yes Yes Yes Yes Yes  Type of Advance Directive Living will;Healthcare Power of [place] will - [place] will [place] will [place]Living will [place]Living will  Does patient want to make changes to medical advance directive? No - Patient declined No - Patient declined - - No - Patient declined - -  Copy of [place]in Chart? No - copy requested No - copy requested - No - copy requested No - copy requested - -  Would patient like information on creating a medical  advance directive? - - - - - - -    Tobacco Social History   Tobacco Use  Smoking Status Never Smoker  Smokeless Tobacco Never Used     Counseling given: Not Answered    Past Medical History:  Diagnosis Date  . Allergic rhinitis   . Anxiety   . Asthma   . Atrial fibrillation ([place]   . CAD (coronary artery disease)   . Depression   . Diverticulosis 03/16/1995  . DJD (degenerative joint disease)    "most joints" (03/11/2015)  . GERD (gastroesophageal reflux disease) 12/30/2000  . Heart attack [place] 1984  . Hiatal hernia 12/30/2000  . History of blood transfusion 1988   "related to GI bleeding OR"  . History of duodenal ulcer 10/15/1986  . Hyperlipidemia   . Hypertension   . Sinus bradycardia   . Stroke ([facility]    Past Surgical History:  Procedure Laterality Date  . CARDIAC CATHETERIZATION    . CATARACT EXTRACTION W/ INTRAOCULAR LENS  IMPLANT, BILATERAL Bilateral ?2013  . CHOLECYSTECTOMY OPEN  1989  . CORONARY ANGIOPLASTY WITH STENT PLACEMENT  2010  . hospital ccu  1984   heart attack, hypoplastic right coronary  . INGUINAL HERNIA REPAIR Right ~ 1990  . NASAL SEPTUM SURGERY  1972  . ORCHIECTOMY Bilateral 06/10/2017   Procedure: BILATERAL ORCHIECTOMY;  Surgeon: DFranchot Gallo MD;  Location: [facility];  Service: Urology;  Laterality: Bilateral;  MAC ANESTHESIA AND LOCAL  . TONSILLECTOMY    .  TOTAL KNEE ARTHROPLASTY Bilateral 1998-2004   "right-left"  . VAGOTOMY AND PYLOROPLASTY  1988   "bleeding duodenal ulcer"   Family History  Problem Relation Age of Onset  . Coronary artery disease Mother        deceased  . Deep vein thrombosis Sister   . Coronary artery disease Brother   . Asthma Brother   . Colon cancer Neg Hx    Social History   Socioeconomic History  . Marital status: Married    Spouse name: Not on file  . Number of children: 2  . Years of education: Not on file  . Highest education level: Not on file  Occupational History  . Occupation: Acupuncturist: RETIRED    Comment: radiologist  Social Needs  . Financial resource strain: Not hard at all  . Food insecurity:    Worry: Never true    Inability: Never true  . Transportation needs:    Medical: No    Non-medical: No  Tobacco Use  . Smoking status: Never Smoker  . Smokeless tobacco: Never Used  Substance and Sexual Activity  . Alcohol use: No  . Drug use: No  . Sexual activity: Not Currently  Lifestyle  . Physical activity:    Days per week: 0 days    Minutes per session: 0 min  . Stress: Only a little  Relationships  . Social connections:    Talks on phone: More than three times a week    Gets together: Three times a week    Attends religious service: Not on file    Active member of club or organization: Yes    Attends meetings of clubs or organizations: More than 4 times per year    Relationship status: Married  Other Topics Concern  . Not on file  Social History Narrative   Married. Regular exercise - yes.       03/08/2018: Married to Therapist, sports, lives in 3 story home, although lives on first level mostly. Using rolling walker lately s/p hospitalization for PNA/flu, tripped over rug recently. Will be receiving PT in-home soon.       Has two grown children.      Enjoys golfing; drives to [place] with wife every winter for a few days    Outpatient Encounter Medications as of 03/08/2018  Medication Sig  . apixaban (ELIQUIS) 5 MG TABS tablet Take 1 tablet (5 mg total) by mouth 2 (two) times daily.  . ARNUITY ELLIPTA 200 MCG/ACT AEPB INHALE 1 PUFF INTO THE LUNGS DAILY  . atorvastatin (LIPITOR) 20 MG tablet TAKE 1 TABLET(20 MG) BY MOUTH DAILY  . benzonatate (TESSALON) 100 MG capsule Take 1 capsule (100 mg total) by mouth 3 (three) times daily.  Marland Kitchen buPROPion (WELLBUTRIN SR) 150 MG 12 hr tablet Take 1 tablet (150 mg total) by mouth daily.  . cetirizine (ZYRTEC) 10 MG tablet Take 10 mg by mouth daily with supper.   . Cholecalciferol (VITAMIN D) 2000 units tablet Take 2,000  Units by mouth daily.  Marland Kitchen diltiazem (CARDIZEM) 30 MG tablet Take 30 mg by mouth as needed.  . dofetilide (TIKOSYN) 250 MCG capsule TAKE 1 CAPSULE(250 MCG) BY MOUTH TWICE DAILY  . guaiFENesin (MUCINEX) 600 MG 12 hr tablet Take 1 tablet (600 mg total) by mouth 2 (two) times daily.  Marland Kitchen LORazepam (ATIVAN) 1 MG tablet TAKE 1/2 TO 1 TABLET BY MOUTH EVERY NIGHT AT BEDTIME AS NEEDED  . metoprolol tartrate (LOPRESSOR) 25 MG tablet Take 1  tablet (25 mg total) by mouth 2 (two) times daily.  . mometasone (NASONEX) 50 MCG/ACT nasal spray Place 1 spray into the nose daily as needed (for allergies).   . nitroGLYCERIN (NITROSTAT) 0.4 MG SL tablet Place 1 tablet (0.4 mg total) under the tongue every 5 (five) minutes x 3 doses as needed for chest pain.  Marland Kitchen omeprazole (PRILOSEC OTC) 20 MG tablet Take 1 tablet (20 mg total) by mouth daily.  . pantoprazole (PROTONIX) 20 MG tablet Take 1 tablet (20 mg total) by mouth daily.  . predniSONE (DELTASONE) 20 MG tablet Take in the morning. Take 3 tabs x 3 days, then 2 tabs x 3 days, 1 day x 3 days.  . tamsulosin (FLOMAX) 0.4 MG CAPS capsule Take 1 capsule (0.4 mg total) by mouth daily.  . traMADol (ULTRAM) 50 MG tablet Take 1 tablet (50 mg total) by mouth every 12 (twelve) hours as needed.  . [DISCONTINUED] albuterol (PROVENTIL HFA;VENTOLIN HFA) 108 (90 Base) MCG/ACT inhaler Inhale 1-2 puffs into the lungs every 6 (six) hours as needed for wheezing or shortness of breath.  . [DISCONTINUED] diltiazem (CARDIZEM) 30 MG tablet Take 1 tablet (30 mg total) by mouth every 8 (eight) hours.  . [DISCONTINUED] guaiFENesin-dextromethorphan (ROBITUSSIN DM) 100-10 MG/5ML syrup Take 5 mLs by mouth every 4 (four) hours as needed for cough. (Patient not taking: Reported on 03/07/2018)  . [DISCONTINUED] levalbuterol (XOPENEX HFA) 45 MCG/ACT inhaler Inhale 1 puff into the lungs every 4 (four) hours as needed for wheezing or shortness of breath.   No facility-administered encounter medications on  file as of 03/08/2018.     Activities of Daily Living In your present state of health, do you have any difficulty performing the following activities: 03/08/2018 02/27/2018  Hearing? Y Y  Comment - -  Vision? Y N  Difficulty concentrating or making decisions? N N  Walking or climbing stairs? Y N  Dressing or bathing? N Y  Doing errands, shopping? N N  Preparing Food and eating ? N -  Using the Toilet? N -  In the past six months, have you accidently leaked urine? Y -  Do you have problems with loss of bowel control? N -  Managing your Medications? N -  Managing your Finances? N -  Housekeeping or managing your Housekeeping? N -  Some recent data might be hidden    Patient Care Team: [name], MD as PCP - General (Family Medicine) Franchot Gallo, MD as Consulting Physician (Urology) Evans Lance, MD as Consulting Physician (Cardiology) Shon Hough, MD as Consulting Physician (Ophthalmology)   Assessment:   This is a routine wellness examination for [facility]. Physical assessment deferred to PCP.   Exercise Activities and Dietary recommendations Current Exercise Habits: The patient does not participate in regular exercise at present, Exercise limited by: cardiac condition(s);orthopedic condition(s);respiratory conditions(s) Diet (meal preparation, eat out, water intake, caffeinated beverages, dairy products, fruits and vegetables): in general, a "healthy" diet  . Appetite normally not great, pt. States, but since being on prednisone, has increased. Ensure drinks offered, but pt. declined at this time.       Goals    . Patient Stated     Recover from pneumonia/flu, get back golfing, go to [place] this winter!       Fall Risk Fall Risk  03/08/2018 09/27/2016 04/12/2016 12/29/2015 11/27/2015  Falls in the past year? 1 No No No No  Number falls in past yr: 0 - - - -  Injury with  Fall? 1 - - - -  Comment rug burn - - - -  Risk for fall due to : History of  fall(s);Impaired balance/gait;Impaired vision - Other (Comment) - Other (Comment)  Follow up Falls prevention discussed;Education provided - - - -    Depression Screen PHQ 2/9 Scores 03/08/2018 04/12/2016 12/29/2015 11/27/2015  PHQ - 2 Score 1 0 0 0  PHQ- 9 Score 3 - - -  Exception Documentation - Other- indicate reason in comment box - Other- indicate reason in comment box    Cognitive Function       Ad8 score reviewed for issues:  Issues making decisions: no  Less interest in hobbies / activities: no  Repeats questions, stories (family complaining): no  Trouble using ordinary gadgets (microwave, computer, phone):no  Forgets the month or year: no  Mismanaging finances: no  Remembering appts: no  Daily problems with thinking and/or memory: yes Ad8 score is= 1  Pt. States he has been feeling "foggy" more so since hospitalization, but no concerns with memory usually. Wife did not express concerns with memory or cognition. Good hydration to assist with clear mentation encouraged.   Qualifies for Shingles Vaccine? Yes, pt. Received shingrix series already.  Screening Tests Health Maintenance  Topic Date Due  . TETANUS/TDAP  02/05/2028  . INFLUENZA VACCINE  Completed  . PNA vac Low Risk Adult  Completed        Plan:    Bring a copy of your living will and/or healthcare power of attorney to your next office visit.  Continue to stay hydrated as you recover from hospitalization.  Follow-up with urologist as scheduled.  Good luck with physical therapy, you'll do great! Thank you for visiting today!  I have personally reviewed and noted the following in the patient's chart:   . Medical and social history . Use of alcohol, tobacco or illicit drugs  . Current medications and supplements . Functional ability and status . Nutritional status . Physical activity . Advanced directives . List of other physicians . Vitals . Screenings to include cognitive, depression,  and falls . Referrals and appointments  In addition, I have reviewed and discussed with patient certain preventive protocols, quality metrics, and best practice recommendations. A written personalized care plan for preventive services as well as general preventive health recommendations were provided to patient.     Alphia Moh, RN  03/08/2018  I have reviewed the documentation for the [facility] and [place] provided by the health coach and agree with their documentation. I was immediately available for any questions  [name] MD [facility] at [facility]

## 2018-03-08 ENCOUNTER — Ambulatory Visit (INDEPENDENT_AMBULATORY_CARE_PROVIDER_SITE_OTHER): Payer: PPO | Admitting: Family Medicine

## 2018-03-08 ENCOUNTER — Encounter: Payer: Self-pay | Admitting: Family Medicine

## 2018-03-08 ENCOUNTER — Other Ambulatory Visit: Payer: Self-pay

## 2018-03-08 ENCOUNTER — Ambulatory Visit (INDEPENDENT_AMBULATORY_CARE_PROVIDER_SITE_OTHER): Payer: PPO

## 2018-03-08 VITALS — BP 136/66 | HR 60 | Temp 97.6°F | Resp 18 | Ht 70.0 in | Wt 163.0 lb

## 2018-03-08 DIAGNOSIS — I10 Essential (primary) hypertension: Secondary | ICD-10-CM

## 2018-03-08 DIAGNOSIS — S80212A Abrasion, left knee, initial encounter: Secondary | ICD-10-CM | POA: Diagnosis not present

## 2018-03-08 DIAGNOSIS — I4819 Other persistent atrial fibrillation: Secondary | ICD-10-CM

## 2018-03-08 DIAGNOSIS — J181 Lobar pneumonia, unspecified organism: Secondary | ICD-10-CM | POA: Diagnosis not present

## 2018-03-08 DIAGNOSIS — Z0000 Encounter for general adult medical examination without abnormal findings: Secondary | ICD-10-CM

## 2018-03-08 DIAGNOSIS — I5032 Chronic diastolic (congestive) heart failure: Secondary | ICD-10-CM | POA: Diagnosis not present

## 2018-03-08 DIAGNOSIS — S50319A Abrasion of unspecified elbow, initial encounter: Secondary | ICD-10-CM | POA: Diagnosis not present

## 2018-03-08 LAB — BASIC METABOLIC PANEL
BUN/Creatinine Ratio: 29 — ABNORMAL HIGH (ref 10–24)
BUN: 28 mg/dL — ABNORMAL HIGH (ref 8–27)
CHLORIDE: 100 mmol/L (ref 96–106)
CO2: 21 mmol/L (ref 20–29)
Calcium: 8.3 mg/dL — ABNORMAL LOW (ref 8.6–10.2)
Creatinine, Ser: 0.98 mg/dL (ref 0.76–1.27)
GFR calc Af Amer: 80 mL/min/{1.73_m2} (ref >59)
GFR calc non Af Amer: 70 mL/min/{1.73_m2} (ref >59)
GLUCOSE: 186 mg/dL — AB (ref 65–99)
POTASSIUM: 4.7 mmol/L (ref 3.5–5.2)
Sodium: 134 mmol/L (ref 134–144)

## 2018-03-08 LAB — MAGNESIUM: MAGNESIUM: 1.9 mg/dL (ref 1.6–2.3)

## 2018-03-08 NOTE — Patient Instructions (Signed)
Gently clean abrasions daily with soap and water  Follow up for any signs of secondary infection  Let's plan 3 week follow up and can get follow up CXR then.

## 2018-03-08 NOTE — Progress Notes (Signed)
Subjective:     Patient ID: [name] Spence, MD, male   DOB: 1929/08/31, 85 y.o.   MRN: [id number]  HPI Patient seen for hospital follow-up.  He was admitted on 18 January with fever along with some sore throat and cough and increased shortness of breath.  He ended up being diagnosed with influenza B and also left lower lobe pneumonia.  His blood cultures were negative.  He was treated with broad-spectrum antibiotics.  He has history of atrial fibrillation and he is on Tikosyn and had apparently dosage of azithromycin in the ED which may have prolonged his QT interval.  Tikosyn was held and then restarted.  He was not discharged on any antibiotics.  He had some initial lactic acidosis on admission and concern for sepsis.  He feels that he is returning to baseline this time.  No cough.  No fever.  He has home physical therapy set up.  He did unfortunately have a fall just couple days ago and has abrasions to both elbows as well as left knee.  Past history of MRSA.  Patient did have some reactive airway issues and wheezing during his admission and received Solu-Medrol and was discharged on tapering dose of prednisone.  He feels his respiratory status is at baseline at this time.  He was treated with Tamiflu.  He has follow-up with cardiology soon regarding his A. fib.  He remains on Eliquis and also on Cardizem and metoprolol for rate control.  He has some diastolic heart failure which has been compensated.  No recent increased peripheral edema issues.  He has history of CAD with obtuse marginal PCI 2010.  No recent chest pains.  Past Medical History:  Diagnosis Date  . Allergic rhinitis   . Anxiety   . Asthma   . Atrial fibrillation ([place])   . CAD (coronary artery disease)   . Depression   . Diverticulosis 03/16/1995  . DJD (degenerative joint disease)    "most joints" (03/11/2015)  . GERD (gastroesophageal reflux disease) 12/30/2000  . Heart attack ([place]) 1984  . Hiatal hernia 12/30/2000  . History  of blood transfusion 1988   "related to GI bleeding OR"  . History of duodenal ulcer 10/15/1986  . Hyperlipidemia   . Hypertension   . Sinus bradycardia   . Stroke [facility])    Past Surgical History:  Procedure Laterality Date  . CARDIAC CATHETERIZATION    . CATARACT EXTRACTION W/ INTRAOCULAR LENS  IMPLANT, BILATERAL Bilateral ?2013  . CHOLECYSTECTOMY OPEN  1989  . CORONARY ANGIOPLASTY WITH STENT PLACEMENT  2010  . hospital ccu  1984   heart attack, hypoplastic right coronary  . INGUINAL HERNIA REPAIR Right ~ 1990  . NASAL SEPTUM SURGERY  1972  . ORCHIECTOMY Bilateral 06/10/2017   Procedure: BILATERAL ORCHIECTOMY;  Surgeon: Franchot Gallo, MD;  Location: [facility];  Service: Urology;  Laterality: Bilateral;  MAC ANESTHESIA AND LOCAL  . TONSILLECTOMY    . TOTAL KNEE ARTHROPLASTY Bilateral 1998-2004   "right-left"  . VAGOTOMY AND PYLOROPLASTY  1988   "bleeding duodenal ulcer"    reports that he has never smoked. He has never used smokeless tobacco. He reports that he does not drink alcohol or use drugs. family history includes Asthma in his brother; Coronary artery disease in his brother and mother; Deep vein thrombosis in his sister. Allergies  Allergen Reactions  . Tylenol [Acetaminophen] Rash  . Advil [Ibuprofen] Nausea Only and Rash  . Asa [Aspirin] Rash     Review of  Systems  Constitutional: Negative for chills, fatigue, fever and unexpected weight change.  Eyes: Negative for visual disturbance.  Respiratory: Negative for cough, chest tightness and shortness of breath.   Cardiovascular: Negative for chest pain, palpitations and leg swelling.  Gastrointestinal: Negative for nausea and vomiting.  Neurological: Negative for dizziness, syncope, weakness, light-headedness and headaches.       Objective:   Physical Exam Constitutional:      Appearance: Normal appearance.  Cardiovascular:     Rate and Rhythm: Normal rate.  Pulmonary:     Comments: slightly decreased aeration  left base but no rales.  No wheezes.  Pulse oximetry is 98% room air Skin:    Comments: He has skin avulsion lacerations involving right and left elbow as well as left knee.  No signs of secondary infection.  Neurological:     Mental Status: He is alert.        Assessment:     #1 recent sepsis secondary to left lower lobe pneumonia and influenza B clinically improved.  He is now off antibiotics.  He has home PT set up  #2 history of persistent atrial fibrillation.  He is on Tikosyn as well as metoprolol Cardizem.  Had recent increased QT prolongation possibly related to azithromycin.  #3 chronic diastolic heart failure  #4 hypertension stable  #5 multiple abrasions including right and left elbow and left knee following fall    Plan:     -Abrasions were cleaned and redressed -Continue with home physical therapy -We will plan routine follow-up in 3 weeks to reassess and consider follow-up chest x-ray then to document clearing of pneumonia. -Follow-up sooner for any recurrent fever or other concerns  [name] MD [facility] at [facility]

## 2018-03-08 NOTE — Patient Instructions (Addendum)
Bring a copy of your living will and/or healthcare power of attorney to your next office visit.  Continue to stay hydrated as you recover from hospitalization.  Follow-up with urologist as scheduled.  Good luck with physical therapy, you'll do great! Thank you for visiting today!   [name] [name] , Thank you for taking time to come for your Medicare Wellness Visit. I appreciate your ongoing commitment to your health goals. Please review the following plan we discussed and let me know if I can assist you in the future.   These are the goals we discussed: Goals    . Patient Stated     Recover from pneumonia/flu, get back golfing, go to [place] this winter!       This is a list of the screening recommended for you and due dates:  Health Maintenance  Topic Date Due  . Tetanus Vaccine  02/05/2028  . Flu Shot  Completed  . Pneumonia vaccines  Completed       Fall Prevention in the Home, Adult Falls can cause injuries. They can happen to people of all ages. There are many things you can do to make your home safe and to help prevent falls. Ask for help when making these changes, if needed. What actions can I take to prevent falls? General Instructions  Use good lighting in all rooms. Replace any light bulbs that burn out.  Turn on the lights when you go into a dark area. Use night-lights.  Keep items that you use often in easy-to-reach places. Lower the shelves around your home if necessary.  Set up your furniture so you have a clear path. Avoid moving your furniture around.  Do not have throw rugs and other things on the floor that can make you trip.  Avoid walking on wet floors.  If any of your floors are uneven, fix them.  Add color or contrast paint or tape to clearly mark and help you see: ? Any grab bars or handrails. ? First and last steps of stairways. ? Where the edge of each step is.  If you use a stepladder: ? Make sure that it is fully opened. Do not climb a  closed stepladder. ? Make sure that both sides of the stepladder are locked into place. ? Ask someone to hold the stepladder for you while you use it.  If there are any pets around you, be aware of where they are. What can I do in the bathroom?      Keep the floor dry. Clean up any water that spills onto the floor as soon as it happens.  Remove soap buildup in the tub or shower regularly.  Use non-skid mats or decals on the floor of the tub or shower.  Attach bath mats securely with double-sided, non-slip rug tape.  If you need to sit down in the shower, use a plastic, non-slip stool.  Install grab bars by the toilet and in the tub and shower. Do not use towel bars as grab bars. What can I do in the bedroom?  Make sure that you have a light by your bed that is easy to reach.  Do not use any sheets or blankets that are too big for your bed. They should not hang down onto the floor.  Have a firm chair that has side arms. You can use this for support while you get dressed. What can I do in the kitchen?  Clean up any spills right away.  If you  need to reach something above you, use a strong step stool that has a grab bar.  Keep electrical cords out of the way.  Do not use floor polish or wax that makes floors slippery. If you must use wax, use non-skid floor wax. What can I do with my stairs?  Do not leave any items on the stairs.  Make sure that you have a light switch at the top of the stairs and the bottom of the stairs. If you do not have them, ask someone to add them for you.  Make sure that there are handrails on both sides of the stairs, and use them. Fix handrails that are broken or loose. Make sure that handrails are as long as the stairways.  Install non-slip stair treads on all stairs in your home.  Avoid having throw rugs at the top or bottom of the stairs. If you do have throw rugs, attach them to the floor with carpet tape.  Choose a carpet that does not  hide the edge of the steps on the stairway.  Check any carpeting to make sure that it is firmly attached to the stairs. Fix any carpet that is loose or worn. What can I do on the outside of my home?  Use bright outdoor lighting.  Regularly fix the edges of walkways and driveways and fix any cracks.  Remove anything that might make you trip as you walk through a door, such as a raised step or threshold.  Trim any bushes or trees on the path to your home.  Regularly check to see if handrails are loose or broken. Make sure that both sides of any steps have handrails.  Install guardrails along the edges of any raised decks and porches.  Clear walking paths of anything that might make someone trip, such as tools or rocks.  Have any leaves, snow, or ice cleared regularly.  Use sand or salt on walking paths during winter.  Clean up any spills in your garage right away. This includes grease or oil spills. What other actions can I take?  Wear shoes that: ? Have a low heel. Do not wear high heels. ? Have rubber bottoms. ? Are comfortable and fit you well. ? Are closed at the toe. Do not wear open-toe sandals.  Use tools that help you move around (mobility aids) if they are needed. These include: ? Canes. ? Walkers. ? Scooters. ? Crutches.  Review your medicines with your doctor. Some medicines can make you feel dizzy. This can increase your chance of falling. Ask your doctor what other things you can do to help prevent falls. Where to find more information  Centers for Disease Control and Prevention, STEADI: https://garcia.biz/  [employer] on Aging: BrainJudge.co.uk Contact a doctor if:  You are afraid of falling at home.  You feel weak, drowsy, or dizzy at home.  You fall at home. Summary  There are many simple things that you can do to make your home safe and to help prevent falls.  Ways to make your home safe include removing tripping hazards and  installing grab bars in the bathroom.  Ask for help when making these changes in your home. This information is not intended to replace advice given to you by your health care provider. Make sure you discuss any questions you have with your health care provider. Document Released: 11/21/2008 Document Revised: 09/09/2016 Document Reviewed: 09/09/2016 Elsevier Interactive Patient Education  2019 [place] Maintenance, Male A  healthy lifestyle and preventive care is important for your health and wellness. Ask your health care provider about what schedule of regular examinations is right for you. What should I know about weight and diet? Eat a Healthy Diet  Eat plenty of vegetables, fruits, whole grains, low-fat dairy products, and lean protein.  Do not eat a lot of foods high in solid fats, added sugars, or salt.  Maintain a Healthy Weight Regular exercise can help you achieve or maintain a healthy weight. You should:  Do at least 150 minutes of exercise each week. The exercise should increase your heart rate and make you sweat (moderate-intensity exercise).  Do strength-training exercises at least twice a week. Watch Your Levels of Cholesterol and Blood Lipids  Have your blood tested for lipids and cholesterol every 5 years starting at 83 years of age. If you are at high risk for heart disease, you should start having your blood tested when you are 83 years old. You may need to have your cholesterol levels checked more often if: ? Your lipid or cholesterol levels are high. ? You are older than 83 years of age. ? You are at high risk for heart disease. What should I know about cancer screening? Many types of cancers can be detected early and may often be prevented. Lung Cancer  You should be screened every year for lung cancer if: ? You are a current smoker who has smoked for at least 30 years. ? You are a former smoker who has quit within the past 15 years.  Talk to  your health care provider about your screening options, when you should start screening, and how often you should be screened. Colorectal Cancer  Routine colorectal cancer screening usually begins at 83 years of age and should be repeated every 5-10 years until you are 83 years old. You may need to be screened more often if early forms of precancerous polyps or small growths are found. Your health care provider may recommend screening at an earlier age if you have risk factors for colon cancer.  Your health care provider may recommend using home test kits to check for hidden blood in the stool.  A small camera at the end of a tube can be used to examine your colon (sigmoidoscopy or colonoscopy). This checks for the earliest forms of colorectal cancer. Prostate and Testicular Cancer  Depending on your age and overall health, your health care provider may do certain tests to screen for prostate and testicular cancer.  Talk to your health care provider about any symptoms or concerns you have about testicular or prostate cancer. Skin Cancer  Check your skin from head to toe regularly.  Tell your health care provider about any new moles or changes in moles, especially if: ? There is a change in a mole's size, shape, or color. ? You have a mole that is larger than a pencil eraser.  Always use sunscreen. Apply sunscreen liberally and repeat throughout the day.  Protect yourself by wearing long sleeves, pants, a wide-brimmed hat, and sunglasses when outside. What should I know about heart disease, diabetes, and high blood pressure?  If you are 54-31 years of age, have your blood pressure checked every 3-5 years. If you are 53 years of age or older, have your blood pressure checked every year. You should have your blood pressure measured twice-once when you are at a hospital or clinic, and once when you are not at a hospital or clinic. Record the  average of the two measurements. To check your blood  pressure when you are not at a hospital or clinic, you can use: ? An automated blood pressure machine at a pharmacy. ? A home blood pressure monitor.  Talk to your health care provider about your target blood pressure.  If you are between 47-6 years old, ask your health care provider if you should take aspirin to prevent heart disease.  Have regular diabetes screenings by checking your fasting blood sugar level. ? If you are at a normal weight and have a low risk for diabetes, have this test once every three years after the age of 55. ? If you are overweight and have a high risk for diabetes, consider being tested at a younger age or more often.  A one-time screening for abdominal aortic aneurysm (AAA) by ultrasound is recommended for men aged 37-75 years who are current or former smokers. What should I know about preventing infection? Hepatitis B If you have a higher risk for hepatitis B, you should be screened for this virus. Talk with your health care provider to find out if you are at risk for hepatitis B infection. Hepatitis C Blood testing is recommended for:  Everyone born from [birth year] through 1963.  Anyone with known risk factors for hepatitis C. Sexually Transmitted Diseases (STDs)  You should be screened each year for STDs including gonorrhea and chlamydia if: ? You are sexually active and are younger than 83 years of age. ? You are older than 83 years of age and your health care provider tells you that you are at risk for this type of infection. ? Your sexual activity has changed since you were last screened and you are at an increased risk for chlamydia or gonorrhea. Ask your health care provider if you are at risk.  Talk with your health care provider about whether you are at high risk of being infected with HIV. Your health care provider may recommend a prescription medicine to help prevent HIV infection. What else can I do?  Schedule regular health, dental, and eye  exams.  Stay current with your vaccines (immunizations).  Do not use any tobacco products, such as cigarettes, chewing tobacco, and e-cigarettes. If you need help quitting, ask your health care provider.  Limit alcohol intake to no more than 2 drinks per day. One drink equals 12 ounces of beer, 5 ounces of wine, or 1 ounces of hard liquor.  Do not use street drugs.  Do not share needles.  Ask your health care provider for help if you need support or information about quitting drugs.  Tell your health care provider if you often feel depressed.  Tell your health care provider if you have ever been abused or do not feel safe at home. This information is not intended to replace advice given to you by your health care provider. Make sure you discuss any questions you have with your health care provider. Document Released: 07/24/2007 Document Revised: 09/24/2015 Document Reviewed: 10/29/2014 Elsevier Interactive Patient Education  2019 [employer].

## 2018-03-10 ENCOUNTER — Ambulatory Visit (HOSPITAL_COMMUNITY): Payer: PPO | Admitting: Nurse Practitioner

## 2018-03-20 ENCOUNTER — Telehealth: Payer: Self-pay

## 2018-03-20 ENCOUNTER — Other Ambulatory Visit: Payer: Self-pay

## 2018-03-20 ENCOUNTER — Emergency Department (HOSPITAL_COMMUNITY): Payer: PPO

## 2018-03-20 ENCOUNTER — Emergency Department (HOSPITAL_COMMUNITY)
Admission: EM | Admit: 2018-03-20 | Discharge: 2018-03-20 | Disposition: A | Discharge status: Home / Self Care | Payer: PPO | Attending: Emergency Medicine | Admitting: Emergency Medicine

## 2018-03-20 DIAGNOSIS — I110 Hypertensive heart disease with heart failure: Secondary | ICD-10-CM | POA: Diagnosis not present

## 2018-03-20 DIAGNOSIS — R51 Headache: Secondary | ICD-10-CM | POA: Insufficient documentation

## 2018-03-20 DIAGNOSIS — M5489 Other dorsalgia: Secondary | ICD-10-CM | POA: Diagnosis not present

## 2018-03-20 DIAGNOSIS — Z96653 Presence of artificial knee joint, bilateral: Secondary | ICD-10-CM | POA: Diagnosis not present

## 2018-03-20 DIAGNOSIS — M546 Pain in thoracic spine: Secondary | ICD-10-CM | POA: Diagnosis not present

## 2018-03-20 DIAGNOSIS — I5032 Chronic diastolic (congestive) heart failure: Secondary | ICD-10-CM | POA: Insufficient documentation

## 2018-03-20 DIAGNOSIS — R52 Pain, unspecified: Secondary | ICD-10-CM | POA: Diagnosis not present

## 2018-03-20 DIAGNOSIS — Z8673 Personal history of transient ischemic attack (TIA), and cerebral infarction without residual deficits: Secondary | ICD-10-CM | POA: Insufficient documentation

## 2018-03-20 DIAGNOSIS — J4530 Mild persistent asthma, uncomplicated: Secondary | ICD-10-CM | POA: Insufficient documentation

## 2018-03-20 DIAGNOSIS — Z7901 Long term (current) use of anticoagulants: Secondary | ICD-10-CM | POA: Insufficient documentation

## 2018-03-20 DIAGNOSIS — I2510 Atherosclerotic heart disease of native coronary artery without angina pectoris: Secondary | ICD-10-CM | POA: Diagnosis not present

## 2018-03-20 DIAGNOSIS — Z79899 Other long term (current) drug therapy: Secondary | ICD-10-CM | POA: Diagnosis not present

## 2018-03-20 DIAGNOSIS — M542 Cervicalgia: Secondary | ICD-10-CM | POA: Diagnosis not present

## 2018-03-20 DIAGNOSIS — W06XXXA Fall from bed, initial encounter: Secondary | ICD-10-CM | POA: Diagnosis not present

## 2018-03-20 DIAGNOSIS — I491 Atrial premature depolarization: Secondary | ICD-10-CM | POA: Diagnosis not present

## 2018-03-20 DIAGNOSIS — I4891 Unspecified atrial fibrillation: Secondary | ICD-10-CM | POA: Diagnosis not present

## 2018-03-20 DIAGNOSIS — S0990XA Unspecified injury of head, initial encounter: Secondary | ICD-10-CM | POA: Diagnosis not present

## 2018-03-20 DIAGNOSIS — S199XXA Unspecified injury of neck, initial encounter: Secondary | ICD-10-CM | POA: Diagnosis not present

## 2018-03-20 DIAGNOSIS — I10 Essential (primary) hypertension: Secondary | ICD-10-CM | POA: Diagnosis not present

## 2018-03-20 LAB — BASIC METABOLIC PANEL
Anion gap: 10 (ref 5–15)
BUN: 11 mg/dL (ref 8–23)
CO2: 23 mmol/L (ref 22–32)
Calcium: 8 mg/dL — ABNORMAL LOW (ref 8.9–10.3)
Chloride: 100 mmol/L (ref 98–111)
Creatinine, Ser: 0.94 mg/dL (ref 0.61–1.24)
GFR calc Af Amer: >60 mL/min (ref >60)
GFR calc non Af Amer: >60 mL/min (ref >60)
Glucose, Bld: 124 mg/dL — ABNORMAL HIGH (ref 70–99)
Potassium: 3.7 mmol/L (ref 3.5–5.1)
Sodium: 133 mmol/L — ABNORMAL LOW (ref 135–145)

## 2018-03-20 LAB — URINALYSIS, ROUTINE W REFLEX MICROSCOPIC
BILIRUBIN URINE: NEGATIVE
Glucose, UA: NEGATIVE mg/dL
Ketones, ur: NEGATIVE mg/dL
Leukocytes, UA: NEGATIVE
Nitrite: NEGATIVE
Protein, ur: NEGATIVE mg/dL
Specific Gravity, Urine: 1.021 (ref 1.005–1.030)
pH: 5 (ref 5.0–8.0)

## 2018-03-20 LAB — CBC WITH DIFFERENTIAL/PLATELET
ABS IMMATURE GRANULOCYTES: 0.08 10*3/uL — AB (ref 0.00–0.07)
Basophils Absolute: 0 10*3/uL (ref 0.0–0.1)
Basophils Relative: 0 %
Eosinophils Absolute: 0 10*3/uL (ref 0.0–0.5)
Eosinophils Relative: 0 %
HCT: 31.5 % — ABNORMAL LOW (ref 39.0–52.0)
Hemoglobin: 9.9 g/dL — ABNORMAL LOW (ref 13.0–17.0)
Immature Granulocytes: 1 %
LYMPHS ABS: 0.5 10*3/uL — AB (ref 0.7–4.0)
Lymphocytes Relative: 4 %
MCH: 29.9 pg (ref 26.0–34.0)
MCHC: 31.4 g/dL (ref 30.0–36.0)
MCV: 95.2 fL (ref 80.0–100.0)
MONOS PCT: 13 %
Monocytes Absolute: 1.6 10*3/uL — ABNORMAL HIGH (ref 0.1–1.0)
Neutro Abs: 9.9 10*3/uL — ABNORMAL HIGH (ref 1.7–7.7)
Neutrophils Relative %: 82 %
Platelets: 117 10*3/uL — ABNORMAL LOW (ref 150–400)
RBC: 3.31 MIL/uL — ABNORMAL LOW (ref 4.22–5.81)
RDW: 14 % (ref 11.5–15.5)
WBC: 12.2 10*3/uL — ABNORMAL HIGH (ref 4.0–10.5)
nRBC: 0 % (ref 0.0–0.2)

## 2018-03-20 MED ORDER — OXYCODONE HCL 5 MG PO TABS: 5.0000 mg | ORAL_TABLET | Freq: Four times a day (QID) | ORAL | 0 refills | Status: DC | PRN

## 2018-03-20 NOTE — Discharge Instructions (Signed)
Your imaging today was overall reassuring.  We suspect you have left paraspinal muscle spasm and pain causing her symptoms.  You had no focal neurologic deficits on initial exam, doubt vertebral artery dissection or stroke at this time.  CT head showed old stroke but no new bleeding.  Please continue taking your home medications.  Your other labs were similar to prior.  We feel you are safe for discharge home.  Please call your PCP to discuss medications to help with your muscle spasm and neck pain.  If any symptoms change or worsen, please return to the nearest emergency department

## 2018-03-20 NOTE — ED Notes (Signed)
Discharge instructions (including medications) discussed with and copy provided to patient/caregiver.  Pt and wife verbalizes understanding.  Pt armband removed.

## 2018-03-20 NOTE — Telephone Encounter (Signed)
Called patients wife Hildred Laser and she was recently in the hospital with Diverticulitis and patient has been complaining of neck pain and was almost not able to move this past weekend and not able to move his neck.   Patient was not able to get out of bed and went to ER today and did an MRI and was negative.  Patient is going to get a hospital bed. They think it is muscle pain.  Is he able to take a muscle relaxer? Or any alternatives?  Please advise.

## 2018-03-20 NOTE — Discharge Planning (Signed)
Pt currently active with [facility] for PT services.  Resumption of care requested. Glyn Ade of [facility] notified.  No DME needs identified at this time.

## 2018-03-20 NOTE — Telephone Encounter (Signed)
Copied from [place] [id number]. Topic: General - Other >> Mar 20, 2018 12:21 PM Judyann Munson wrote: Reason for CRM: Patient wife is calling in regards to Pain medication for the patient, she needing advice and call back. Please advise

## 2018-03-20 NOTE — ED Provider Notes (Signed)
[facility] EMERGENCY DEPARTMENT Provider Note   CSN: [id number] Arrival date & time: 03/20/18  0818     History   Chief Complaint Chief Complaint  Patient presents with  . Fall  . 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  The history is provided by the patient. No language interpreter was used.  Fall  This is a new problem. The current episode started more than 2 days ago. The problem occurs constantly. The problem has not changed since onset.Associated symptoms include headaches (improved). Pertinent negatives include no chest pain, no abdominal pain and no shortness of breath. The symptoms are aggravated by twisting. Nothing relieves the symptoms. He has tried nothing for the symptoms. The treatment provided no relief.    Past Medical History:  Diagnosis Date  . Allergic rhinitis   . Anxiety   . Asthma   . Atrial fibrillation ([place])   . CAD (coronary artery disease)   . Depression   . Diverticulosis 03/16/1995  . DJD (degenerative joint disease)    "most joints" (03/11/2015)  . GERD (gastroesophageal reflux disease) 12/30/2000  . Heart attack ([place]) 1984  . Hiatal hernia 12/30/2000  . History of blood transfusion 1988   "related to GI bleeding OR"  . History of duodenal ulcer 10/15/1986  . Hyperlipidemia   . Hypertension   . Sinus bradycardia   . Stroke [facility])     Patient Active Problem List   Diagnosis Date Noted  . Lobar pneumonia (HCC)-left lower lobs  02/26/2018  . Influenza B 02/26/2018  . Sepsis ([place]) 02/25/2018  . BPH associated with nocturia 11/21/2017  . Arthritis of carpometacarpal [facility]) joint of right thumb 07/06/2016  . Right foot pain 02/11/2016  . Abnormality of gait 11/27/2015  . Pain in joint, ankle and foot 11/27/2015  . History of asthma 10/23/2015  . Chronic diastolic heart failure ([place])   . Acute hemorrhagic infarction of brain ([place])   . Hypersensitivity reaction 04/01/2015  . Persistent atrial fibrillation  04/01/2015  . ICH (intracerebral hemorrhage) ([place])   . TIA (transient ischemic attack) 03/31/2015  . Visit for monitoring Tikosyn therapy 03/11/2015  . Paroxysmal atrial fibrillation (HCC)   . Pulmonary hypertension ([place]) 09/26/2014  . Bradycardia 12/21/2012  . Diastolic dysfunction 01/00/7121  . CAD S/P OM PCI 2010 12/16/2008  . Hyperlipidemia 06/20/2007  . Anxiety state 06/20/2007  . Essential hypertension 06/20/2007  . Allergic rhinitis 06/20/2007  . Mild persistent asthma without complication 97/58/8325  . GERD 06/20/2007    Past Surgical History:  Procedure Laterality Date  . CARDIAC CATHETERIZATION    . CATARACT EXTRACTION W/ INTRAOCULAR LENS  IMPLANT, BILATERAL Bilateral ?2013  . CHOLECYSTECTOMY OPEN  1989  . CORONARY ANGIOPLASTY WITH STENT PLACEMENT  2010  . hospital ccu  1984   heart attack, hypoplastic right coronary  . INGUINAL HERNIA REPAIR Right ~ 1990  . NASAL SEPTUM SURGERY  1972  . ORCHIECTOMY Bilateral 06/10/2017   Procedure: BILATERAL ORCHIECTOMY;  Surgeon: Franchot Gallo, MD;  Location: [facility] ORS;  Service: Urology;  Laterality: Bilateral;  MAC ANESTHESIA AND LOCAL  . TONSILLECTOMY    . TOTAL KNEE ARTHROPLASTY Bilateral 1998-2004   "right-left"  . VAGOTOMY AND PYLOROPLASTY  1988   "bleeding duodenal ulcer"        Home Medications    Prior to Admission medications   Medication Sig Start Date End Date Taking? Authorizing Provider  apixaban (ELIQUIS) 5 MG TABS tablet Take 1 tablet (5  mg total) by mouth 2 (two) times daily. 11/10/17   Larey Dresser, MD  ARNUITY ELLIPTA 200 MCG/ACT AEPB INHALE 1 PUFF INTO THE LUNGS DAILY 11/29/17   Brand Males, MD  atorvastatin (LIPITOR) 20 MG tablet TAKE 1 TABLET(20 MG) BY MOUTH DAILY 11/21/17   [name], MD  benzonatate (TESSALON) 100 MG capsule Take 1 capsule (100 mg total) by mouth 3 (three) times daily. 03/03/18   Mikhail, Velta Addison, DO  buPROPion (WELLBUTRIN SR) 150 MG 12 hr tablet Take 1 tablet (150 mg  total) by mouth daily. 11/21/17   [name], MD  cetirizine (ZYRTEC) 10 MG tablet Take 10 mg by mouth daily with supper.     [provider]  Cholecalciferol (VITAMIN D) 2000 units tablet Take 2,000 Units by mouth daily.    [provider]  diltiazem (CARDIZEM) 30 MG tablet Take 30 mg by mouth as needed.    [provider]  dofetilide (TIKOSYN) 250 MCG capsule TAKE 1 CAPSULE(250 MCG) BY MOUTH TWICE DAILY 01/24/18   Bensimhon, Shaune Pascal, MD  guaiFENesin (MUCINEX) 600 MG 12 hr tablet Take 1 tablet (600 mg total) by mouth 2 (two) times daily. 03/03/18   Mikhail, Velta Addison, DO  LORazepam (ATIVAN) 1 MG tablet TAKE 1/2 TO 1 TABLET BY MOUTH EVERY NIGHT AT BEDTIME AS NEEDED 01/18/18   Burchette, Alinda Sierras, MD  metoprolol tartrate (LOPRESSOR) 25 MG tablet Take 1 tablet (25 mg total) by mouth 2 (two) times daily. 03/03/18   Mikhail, Velta Addison, DO  mometasone (NASONEX) 50 MCG/ACT nasal spray Place 1 spray into the nose daily as needed (for allergies).     [provider]  nitroGLYCERIN (NITROSTAT) 0.4 MG SL tablet Place 1 tablet (0.4 mg total) under the tongue every 5 (five) minutes x 3 doses as needed for chest pain. 01/04/17   [name], MD  omeprazole (PRILOSEC OTC) 20 MG tablet Take 1 tablet (20 mg total) by mouth daily. 09/17/13   [name], MD  pantoprazole (PROTONIX) 20 MG tablet Take 1 tablet (20 mg total) by mouth daily. 03/04/18   Mikhail, Velta Addison, DO  predniSONE (DELTASONE) 20 MG tablet Take in the morning. Take 3 tabs x 3 days, then 2 tabs x 3 days, 1 day x 3 days. 03/03/18   Mikhail, Velta Addison, DO  tamsulosin (FLOMAX) 0.4 MG CAPS capsule Take 1 capsule (0.4 mg total) by mouth daily. 11/21/17   [name], MD  traMADol (ULTRAM) 50 MG tablet Take 1 tablet (50 mg total) by mouth every 12 (twelve) hours as needed. 11/21/17   [name], MD    Family History Family History  Problem Relation Age of Onset  . Coronary artery disease Mother         deceased  . Deep vein thrombosis Sister   . Coronary artery disease Brother   . Asthma Brother   . Colon cancer Neg Hx     Social History Social History   Tobacco Use  . Smoking status: Never Smoker  . Smokeless tobacco: Never Used  Substance Use Topics  . Alcohol use: No  . Drug use: No     Allergies   Tylenol [acetaminophen]; Advil [ibuprofen]; and Asa [aspirin]   Review of Systems Review of Systems  Constitutional: Negative for activity change, chills, diaphoresis, fatigue and fever.  HENT: Negative for congestion and rhinorrhea.   Eyes: Negative for visual disturbance.  Respiratory: Negative for cough, chest tightness, shortness of breath, wheezing and stridor.  Cardiovascular: Negative for chest pain, palpitations and leg swelling.  Gastrointestinal: Negative for abdominal distention, abdominal pain, blood in stool, constipation, diarrhea, nausea and vomiting.  Genitourinary: Positive for urgency. Negative for difficulty urinating, dysuria, flank pain and hematuria.  Musculoskeletal: Positive for neck pain. Negative for back pain and gait problem.  Skin: Negative for rash and wound.  Neurological: Positive for headaches (improved). Negative for dizziness, seizures, syncope, facial asymmetry, speech difficulty, weakness, light-headedness and numbness.  Psychiatric/Behavioral: Negative for agitation.  All other systems reviewed and are negative.    Physical Exam Updated Vital Signs BP (!) 136/59 (BP Location: Right Arm) Comment: Simultaneous filing. User may not have seen previous data.  Pulse 84 Comment: Simultaneous filing. User may not have seen previous data.  Temp 97.8 F (36.6 C) (Oral)   Resp 16 Comment: Simultaneous filing. User may not have seen previous data.  Ht _0  (1.778 m)   Wt 73 kg   SpO2 97% Comment: Simultaneous filing. User may not have seen previous data.  BMI 23.09 kg/m   Physical Exam Vitals signs and nursing note reviewed.    Constitutional:      General: He is not in acute distress.    Appearance: He is well-developed. He is not ill-appearing or toxic-appearing.  HENT:     Head: Normocephalic and atraumatic.     Nose: No congestion.     Mouth/Throat:     Mouth: Mucous membranes are moist.     Pharynx: No oropharyngeal exudate or posterior oropharyngeal erythema.  Eyes:     Conjunctiva/sclera: Conjunctivae normal.  Neck:     Musculoskeletal: Neck supple. Pain with movement, spinous process tenderness and muscular tenderness present. No erythema.   Cardiovascular:     Rate and Rhythm: Normal rate and regular rhythm.     Pulses: Normal pulses.     Heart sounds: No murmur.  Pulmonary:     Effort: Pulmonary effort is normal. No respiratory distress.     Breath sounds: Normal breath sounds. No wheezing, rhonchi or rales.  Chest:     Chest wall: No tenderness.  Abdominal:     General: Abdomen is flat.     Palpations: Abdomen is soft.     Tenderness: There is no abdominal tenderness.  Musculoskeletal:        General: Tenderness (neck) present.     Right lower leg: No edema.     Left lower leg: No edema.  Skin:    General: Skin is warm and dry.     Findings: No erythema or rash.  Neurological:     General: No focal deficit present.     Mental Status: He is alert and oriented to person, place, and time.     GCS: GCS eye subscore is 4. GCS verbal subscore is 5. GCS motor subscore is 6.     Cranial Nerves: No dysarthria or facial asymmetry.     Sensory: Sensation is intact. No sensory deficit.     Motor: No weakness, tremor, abnormal muscle tone or seizure activity.  Psychiatric:        Mood and Affect: Mood normal.      ED Treatments / Results  Labs (all labs ordered are listed, but only abnormal results are displayed) Labs Reviewed  CBC WITH DIFFERENTIAL/PLATELET - Abnormal; Notable for the following components:      Result Value   WBC 12.2 (*)    RBC 3.31 (*)    Hemoglobin 9.9 (*)    HCT  31.5 (*)  Platelets 117 (*)    Neutro Abs 9.9 (*)    Lymphs Abs 0.5 (*)    Monocytes Absolute 1.6 (*)    Abs Immature Granulocytes 0.08 (*)    All other components within normal limits  BASIC METABOLIC PANEL - Abnormal; Notable for the following components:   Sodium 133 (*)    Glucose, Bld 124 (*)    Calcium 8.0 (*)    All other components within normal limits  URINALYSIS, ROUTINE W REFLEX MICROSCOPIC - Abnormal; Notable for the following components:   Hgb urine dipstick SMALL (*)    Bacteria, UA RARE (*)    All other components within normal limits  URINE CULTURE    EKG EKG Interpretation  Date/Time:  Monday March 20 2018 09:20:05 EST Ventricular Rate:  78 PR Interval:    QRS Duration: 87 QT Interval:  448 QTC Calculation: 511 R Axis:   54 Text Interpretation:  Sinus rhythm Multiple premature complexes, vent & supraven Short PR interval Low voltage, precordial leads Borderline T abnormalities, diffuse leads Prolonged QT interval When compared to prior, no signifiant changes seen.  No STEMI Confirmed by Antony Blackbird [id number]) on 03/20/2018 9:40:32 AM   Radiology Ct Head Wo Contrast  Result Date: 03/20/2018 CLINICAL DATA:  Fall from bed with neck pain. EXAM: CT HEAD WITHOUT CONTRAST CT CERVICAL SPINE WITHOUT CONTRAST TECHNIQUE: Multidetector CT imaging of the head and cervical spine was performed following the standard protocol without intravenous contrast. Multiplanar CT image reconstructions of the cervical spine were also generated. COMPARISON:  Head CT 04/02/2015 FINDINGS: CT HEAD FINDINGS Brain: Remote right MCA branch infarcts along the frontal and parietal convexities. These are progressed in extent from 2017 brain MRI but chronic appearing. Cerebral volume loss with mild chronic small vessel ischemic type change. No acute hemorrhage, hydrocephalus, mass, or swelling. Vascular: Atherosclerotic calcification Skull: Right frontal osteoma Sinuses/Orbits: Bilateral cataract  resection.  Chronic sinusitis. CT CERVICAL SPINE FINDINGS Alignment: No traumatic malalignment or fracture. Skull base and vertebrae: Negative for fracture Soft tissues and spinal canal: No prevertebral fluid or swelling. No visible canal hematoma. Disc levels:  Usual degenerative changes. Upper chest: Negative. IMPRESSION: 1. No evidence of acute intracranial or cervical spine injury. 2. Remote right MCA branch infarcts. Electronically Signed   By: Monte Fantasia M.D.   On: 03/20/2018 09:36   Ct Cervical Spine Wo Contrast  Result Date: 03/20/2018 CLINICAL DATA:  Fall from bed with neck pain. EXAM: CT HEAD WITHOUT CONTRAST CT CERVICAL SPINE WITHOUT CONTRAST TECHNIQUE: Multidetector CT imaging of the head and cervical spine was performed following the standard protocol without intravenous contrast. Multiplanar CT image reconstructions of the cervical spine were also generated. COMPARISON:  Head CT 04/02/2015 FINDINGS: CT HEAD FINDINGS Brain: Remote right MCA branch infarcts along the frontal and parietal convexities. These are progressed in extent from 2017 brain MRI but chronic appearing. Cerebral volume loss with mild chronic small vessel ischemic type change. No acute hemorrhage, hydrocephalus, mass, or swelling. Vascular: Atherosclerotic calcification Skull: Right frontal osteoma Sinuses/Orbits: Bilateral cataract resection.  Chronic sinusitis. CT CERVICAL SPINE FINDINGS Alignment: No traumatic malalignment or fracture. Skull base and vertebrae: Negative for fracture Soft tissues and spinal canal: No prevertebral fluid or swelling. No visible canal hematoma. Disc levels:  Usual degenerative changes. Upper chest: Negative. IMPRESSION: 1. No evidence of acute intracranial or cervical spine injury. 2. Remote right MCA branch infarcts. Electronically Signed   By: Monte Fantasia M.D.   On: 03/20/2018 09:36    Procedures  Procedures (including critical care time)  Medications Ordered in ED Medications - No  data to display   Initial Impression / Assessment and Plan / ED Course  I have reviewed the triage vital signs and the nursing notes.  Pertinent labs & imaging results that were available during my care of the patient were reviewed by me and considered in my medical decision making (see chart for details).     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 fib on Eliquis, hypertension, hyperlipidemia, and prior stroke who presents with neck pain and headache 1 week after a fall.  Patient reports that 1 week ago he slid out of bed and started having headache and neck pain.  He reports his pain is been persistent.  He reports the pain is worsened when he tries to move his neck or is palpated on his neck.  He denies any headaches currently.  He denies any vision changes, nausea, vomiting, numbness, tingling, weakness of extremities.  He denies any mid or low back pain.  Denies any anterior symptoms.  Denies difficulty swallowing or breathing.  No other complaints.  Reports that his cough and congestion has resolved after he was admitted last month for pneumonia.  He is doing much better from that regard.  No fevers or chills.  EMS reports that they gave him pain medicine and in route which improved his symptoms.  He is currently a 3 out of 10 in severity.  On exam, patient has tenderness in his middle and left paraspinal neck.  Also some left occipital tenderness.  Normal sensation in all extremities and face.  No facial droop.  Speech is clear.  Pupils are symmetric and reactive.  Normal grip strength and sensation in extremities.  Abdomen and chest nontender.  Based on symptoms of neck pain after an injury/fall, patient will have imaging of his head and neck.  He will have screen laboratory testing as well.  Low suspicion for meningitis given lack of fevers, chills, headache, or other symptoms and that his  pain started with trauma.  Anticipate reassessment after imaging and work-up.  11:08 AM Diagnostic work-up was overall reassuring.  Patient has similar leukocytosis and anemia to prior.  Metabolic panel shows normal creatinine.  Similar electrolyte imbalance.  Urinalysis shows no nitrites or leukocytes, doubt UTI.  Patient CT of the head and neck showed no new fracture dislocation.  Old strokes were seen.  Patient was feeling better.  Suspect left-sided paraspinal muscle spasms.  Given patient's history of chronic disease, will defer to PCP for medications.  Patient agreed with not getting narcotics or muscle relaxant at this time until touching base with PCP.  Given well appearance, and reassuring work-up, patient will be discharged back to see PCP and family and patient agreed with plan of care.  Patient discharged in good condition.    Final Clinical Impressions(s) / ED Diagnoses   Final diagnoses:  Pain in left paraspinal region  Neck pain on left side    ED Discharge Orders         Ordered    DME Hospital bed     03/20/18 1125          Clinical Impression: 1. Pain in left paraspinal region   2. Neck pain on left side     Disposition: Discharge  Condition: Good  I have discussed the results, Dx and  Tx plan with the pt(& family if present). He/she/they expressed understanding and agree(s) with the plan. Discharge instructions discussed at great length. Strict return precautions discussed and pt &/or family have verbalized understanding of the instructions. No further questions at time of discharge.    Discharge Medication List as of 03/20/2018 11:11 AM      Follow Up: [name], MD [place] [address] [place] [address] [id number] mc [place]       Tegeler, Gwenyth Allegra, MD 03/20/18 1535

## 2018-03-20 NOTE — ED Triage Notes (Signed)
Pt arrives via EMS from home with neck pain since fall on Feb 4, mechanical fall. Appears dehydrated. Recently finished antibiotics for pneumonia. Pt unable to sleep last night due to pain. Received 143mcg fentanyl PTA, 18RFA. Pt alert, oriented x4, hx of afib, on eliquis.

## 2018-03-20 NOTE — Telephone Encounter (Signed)
ER notes from earlier today reviewed.  Patient is having fairly severe pain and difficulty sleeping.  He had some leftover oxycodone and has been taking very low-dose of half of 5 mg which helped him sleep last night.  He is requesting just a few of these try to help through the night.  Will try to avoid muscle relaxers with his age.  We sent in oxycodone 5 mg 1 tablet every 6 hours as needed for severe pain #20 with no refills.  He will also continue conservative measures such as heat and touch base if not improving over the next few days

## 2018-03-20 NOTE — Discharge Planning (Signed)
Fuller Mandril, RN, BSN, [place] [phone] Pt qualifies for DME hospital bed.  DME  ordered through [facility].  Melene Muller of [facility] notified to deliver hospital bed to pt room prior to D/C home.

## 2018-03-21 DIAGNOSIS — M6281 Muscle weakness (generalized): Secondary | ICD-10-CM | POA: Diagnosis not present

## 2018-03-21 DIAGNOSIS — F329 Major depressive disorder, single episode, unspecified: Secondary | ICD-10-CM | POA: Diagnosis not present

## 2018-03-21 DIAGNOSIS — I2510 Atherosclerotic heart disease of native coronary artery without angina pectoris: Secondary | ICD-10-CM | POA: Diagnosis not present

## 2018-03-21 DIAGNOSIS — Z9181 History of falling: Secondary | ICD-10-CM | POA: Diagnosis not present

## 2018-03-21 DIAGNOSIS — K219 Gastro-esophageal reflux disease without esophagitis: Secondary | ICD-10-CM | POA: Diagnosis not present

## 2018-03-21 DIAGNOSIS — I110 Hypertensive heart disease with heart failure: Secondary | ICD-10-CM | POA: Diagnosis not present

## 2018-03-21 DIAGNOSIS — I272 Pulmonary hypertension, unspecified: Secondary | ICD-10-CM | POA: Diagnosis not present

## 2018-03-21 DIAGNOSIS — J45901 Unspecified asthma with (acute) exacerbation: Secondary | ICD-10-CM | POA: Diagnosis not present

## 2018-03-21 DIAGNOSIS — M1990 Unspecified osteoarthritis, unspecified site: Secondary | ICD-10-CM | POA: Diagnosis not present

## 2018-03-21 DIAGNOSIS — N400 Enlarged prostate without lower urinary tract symptoms: Secondary | ICD-10-CM | POA: Diagnosis not present

## 2018-03-21 DIAGNOSIS — Z7901 Long term (current) use of anticoagulants: Secondary | ICD-10-CM | POA: Diagnosis not present

## 2018-03-21 DIAGNOSIS — F419 Anxiety disorder, unspecified: Secondary | ICD-10-CM | POA: Diagnosis not present

## 2018-03-21 DIAGNOSIS — I5032 Chronic diastolic (congestive) heart failure: Secondary | ICD-10-CM | POA: Diagnosis not present

## 2018-03-21 DIAGNOSIS — I480 Paroxysmal atrial fibrillation: Secondary | ICD-10-CM | POA: Diagnosis not present

## 2018-03-21 DIAGNOSIS — Z8701 Personal history of pneumonia (recurrent): Secondary | ICD-10-CM | POA: Diagnosis not present

## 2018-03-21 DIAGNOSIS — Z8673 Personal history of transient ischemic attack (TIA), and cerebral infarction without residual deficits: Secondary | ICD-10-CM | POA: Diagnosis not present

## 2018-03-21 LAB — URINE CULTURE: Culture: <10000 — AB

## 2018-03-29 ENCOUNTER — Other Ambulatory Visit: Payer: Self-pay

## 2018-03-29 ENCOUNTER — Ambulatory Visit (INDEPENDENT_AMBULATORY_CARE_PROVIDER_SITE_OTHER): Payer: PPO | Admitting: Family Medicine

## 2018-03-29 ENCOUNTER — Encounter: Payer: Self-pay | Admitting: Family Medicine

## 2018-03-29 VITALS — BP 120/76 | HR 67 | Temp 97.3°F | Ht 70.0 in | Wt 162.6 lb

## 2018-03-29 DIAGNOSIS — J181 Lobar pneumonia, unspecified organism: Secondary | ICD-10-CM | POA: Diagnosis not present

## 2018-03-29 DIAGNOSIS — S50319A Abrasion of unspecified elbow, initial encounter: Secondary | ICD-10-CM

## 2018-03-29 DIAGNOSIS — M436 Torticollis: Secondary | ICD-10-CM

## 2018-03-29 NOTE — Progress Notes (Signed)
Subjective:     Patient ID: [name] Spence, MD, male   DOB: Dec 31, 1929, 85 y.o.   MRN: [id number]  HPI Patient is seen for medical follow-up.  Refer to recent notes.  He had admission back in January with fever and ended up having influenza and left lower lobe pneumonia.  Also has history of atrial fibrillation.  He feels he is recovering from the pneumonia.  He has no cough.  No recurrent fever.  No dyspnea.  He is getting home physical therapy and feels he is getting stronger.  We had considered whether to follow-up chest x-ray today to document clearing.  He then presented to the ED on 03/20/2018 with some neck pain and headache and concern for some torticollis.  He did not recall sleeping in any odd positions.  No fever.  He had CT the head and CT of the cervical spine which showed no acute changes.  He had evidence for old strokes.  Lab work was unremarkable.  He was not placed on any medications.  We were hesitant to use muscle relaxers given his age and high risk of fall.  He did take very low-dose oxycodone on a couple of occasions only but symptoms are improving.  He also has had some physical therapy with his neck and upper back.  Gradually improving.  No recent radiculitis symptoms  He had multiple abrasions involving left knee and both elbows and those are healing very well  Past Medical History:  Diagnosis Date  . Allergic rhinitis   . Anxiety   . Asthma   . Atrial fibrillation ([place])   . CAD (coronary artery disease)   . Depression   . Diverticulosis 03/16/1995  . DJD (degenerative joint disease)    "most joints" (03/11/2015)  . GERD (gastroesophageal reflux disease) 12/30/2000  . Heart attack ([place]) 1984  . Hiatal hernia 12/30/2000  . History of blood transfusion 1988   "related to GI bleeding OR"  . History of duodenal ulcer 10/15/1986  . Hyperlipidemia   . Hypertension   . Sinus bradycardia   . Stroke [facility])    Past Surgical History:  Procedure Laterality Date  . CARDIAC  CATHETERIZATION    . CATARACT EXTRACTION W/ INTRAOCULAR LENS  IMPLANT, BILATERAL Bilateral ?2013  . CHOLECYSTECTOMY OPEN  1989  . CORONARY ANGIOPLASTY WITH STENT PLACEMENT  2010  . hospital ccu  1984   heart attack, hypoplastic right coronary  . INGUINAL HERNIA REPAIR Right ~ 1990  . NASAL SEPTUM SURGERY  1972  . ORCHIECTOMY Bilateral 06/10/2017   Procedure: BILATERAL ORCHIECTOMY;  Surgeon: Franchot Gallo, MD;  Location: [facility];  Service: Urology;  Laterality: Bilateral;  MAC ANESTHESIA AND LOCAL  . TONSILLECTOMY    . TOTAL KNEE ARTHROPLASTY Bilateral 1998-2004   "right-left"  . VAGOTOMY AND PYLOROPLASTY  1988   "bleeding duodenal ulcer"    reports that he has never smoked. He has never used smokeless tobacco. He reports that he does not drink alcohol or use drugs. family history includes Asthma in his brother; Coronary artery disease in his brother and mother; Deep vein thrombosis in his sister. Allergies  Allergen Reactions  . Tylenol [Acetaminophen] Rash  . Advil [Ibuprofen] Nausea Only and Rash  . Asa [Aspirin] Rash     Review of Systems  Constitutional: Negative for chills and fever.  Respiratory: Negative for cough and shortness of breath.   Cardiovascular: Negative for chest pain.  Gastrointestinal: Negative for abdominal pain.  Genitourinary: Negative for dysuria.  Neurological:  Negative for dizziness and weakness.  Psychiatric/Behavioral: Negative for confusion.       Objective:   Physical Exam Constitutional:      Appearance: Normal appearance.  Cardiovascular:     Rate and Rhythm: Normal rate.  Pulmonary:     Effort: Pulmonary effort is normal.     Breath sounds: Normal breath sounds.  Musculoskeletal:     Comments: He has somewhat limited range of motion with lateral bending or rotation to the right or left but he states this is his baseline.  Skin:    Comments: Abrasions involving left knee and both elbows are healing very well with no signs of  secondary infection  Neurological:     Mental Status: He is alert.        Assessment:     #1 recent community-acquired left lower lobe pneumonia clinically improved.  We discussed whether to get follow-up x-ray at this point he declines.  #2 recent musculoskeletal pain and torticollis involving the neck muscles.  Improving    Plan:     -Continue physical therapy. -We gave option of follow-up chest x-ray to document clearing and he declines at this point. -We will plan routine follow-up and 1 year for routine health checkup and sooner as needed  [name] MD [facility]

## 2018-04-04 ENCOUNTER — Encounter (HOSPITAL_COMMUNITY): Payer: Self-pay | Admitting: Nurse Practitioner

## 2018-04-04 ENCOUNTER — Ambulatory Visit (HOSPITAL_COMMUNITY)
Admission: RE | Admit: 2018-04-04 | Discharge: 2018-04-04 | Disposition: A | Discharge status: Home / Self Care | Payer: PPO | Source: Ambulatory Visit | Attending: Nurse Practitioner | Admitting: Nurse Practitioner

## 2018-04-04 ENCOUNTER — Other Ambulatory Visit (HOSPITAL_COMMUNITY): Payer: Self-pay | Admitting: *Deleted

## 2018-04-04 VITALS — BP 106/62 | HR 93 | Ht 70.0 in | Wt 156.0 lb

## 2018-04-04 DIAGNOSIS — K219 Gastro-esophageal reflux disease without esophagitis: Secondary | ICD-10-CM | POA: Diagnosis not present

## 2018-04-04 DIAGNOSIS — Z8673 Personal history of transient ischemic attack (TIA), and cerebral infarction without residual deficits: Secondary | ICD-10-CM | POA: Diagnosis not present

## 2018-04-04 DIAGNOSIS — Z886 Allergy status to analgesic agent status: Secondary | ICD-10-CM | POA: Diagnosis not present

## 2018-04-04 DIAGNOSIS — E785 Hyperlipidemia, unspecified: Secondary | ICD-10-CM | POA: Diagnosis not present

## 2018-04-04 DIAGNOSIS — Z79899 Other long term (current) drug therapy: Secondary | ICD-10-CM | POA: Insufficient documentation

## 2018-04-04 DIAGNOSIS — Z8249 Family history of ischemic heart disease and other diseases of the circulatory system: Secondary | ICD-10-CM | POA: Insufficient documentation

## 2018-04-04 DIAGNOSIS — I10 Essential (primary) hypertension: Secondary | ICD-10-CM | POA: Insufficient documentation

## 2018-04-04 DIAGNOSIS — I2510 Atherosclerotic heart disease of native coronary artery without angina pectoris: Secondary | ICD-10-CM | POA: Diagnosis not present

## 2018-04-04 DIAGNOSIS — Z7901 Long term (current) use of anticoagulants: Secondary | ICD-10-CM | POA: Insufficient documentation

## 2018-04-04 DIAGNOSIS — F419 Anxiety disorder, unspecified: Secondary | ICD-10-CM | POA: Insufficient documentation

## 2018-04-04 DIAGNOSIS — I480 Paroxysmal atrial fibrillation: Secondary | ICD-10-CM | POA: Diagnosis not present

## 2018-04-04 DIAGNOSIS — Z955 Presence of coronary angioplasty implant and graft: Secondary | ICD-10-CM | POA: Diagnosis not present

## 2018-04-04 LAB — CBC
HCT: 39.3 % (ref 39.0–52.0)
Hemoglobin: 12.2 g/dL — ABNORMAL LOW (ref 13.0–17.0)
MCH: 29.6 pg (ref 26.0–34.0)
MCHC: 31 g/dL (ref 30.0–36.0)
MCV: 95.4 fL (ref 80.0–100.0)
Platelets: 371 10*3/uL (ref 150–400)
RBC: 4.12 MIL/uL — ABNORMAL LOW (ref 4.22–5.81)
RDW: 14.8 % (ref 11.5–15.5)
WBC: 8.1 10*3/uL (ref 4.0–10.5)
nRBC: 0 % (ref 0.0–0.2)

## 2018-04-04 LAB — BASIC METABOLIC PANEL
Anion gap: 5 (ref 5–15)
BUN: 17 mg/dL (ref 8–23)
CHLORIDE: 104 mmol/L (ref 98–111)
CO2: 29 mmol/L (ref 22–32)
Calcium: 9.2 mg/dL (ref 8.9–10.3)
Creatinine, Ser: 1.24 mg/dL (ref 0.61–1.24)
GFR calc Af Amer: >60 mL/min (ref >60)
GFR calc non Af Amer: 52 mL/min — ABNORMAL LOW (ref >60)
Glucose, Bld: 142 mg/dL — ABNORMAL HIGH (ref 70–99)
Potassium: 4 mmol/L (ref 3.5–5.1)
Sodium: 138 mmol/L (ref 135–145)

## 2018-04-04 LAB — MAGNESIUM: Magnesium: 2.2 mg/dL (ref 1.7–2.4)

## 2018-04-04 MED ORDER — ATENOLOL 25 MG PO TABS: 25.0000 mg | ORAL_TABLET | Freq: Every day | ORAL | 3 refills | Status: DC

## 2018-04-04 NOTE — Progress Notes (Signed)
Primary Care Physician: [name], MD Referring Physician:[facility] f/u Cardiologist: Dr. Robyn Haber, MD is a 85 y.o. male with a h/o atrial fibrillation on Tikosyn and ELiquis that was recently hospitalized for CAP. He received one dose of a azithromycin 02/25/18  and qtc prolonged to 528 ms. Cardiology was consulted and held [place] for a few days until qtc returned to normal range. He was then reloaded on Tikosyn with normal qtc interval. He was seen by Dr. Lovena Le 1/28 and pt reported that Cardizem was stopped for low HR readings. He was changed from atenolol to  Metoprolol but pt took for one month and went back to atenolol.  He is now in the office for f/u. EKG shows rate controlled afib with PVC's. He noted a heart rate over 100 when he first woke up this am and took Cardizem 30 mg. He has been in SR until this am. After talking to the pt for several minutes, his heart rate auscultated regular. Repeat EKG was done and he was back in SR.   Currently, he denies symptoms of chest pain, shortness of breath, orthopnea, PND, lower extremity edema, dizziness, presyncope, syncope, or neurologic sequela. The patient is tolerating medications without difficulties and is otherwise without complaint today.   Past Medical History:  Diagnosis Date  . Allergic rhinitis   . Anxiety   . Asthma   . Atrial fibrillation ([place])   . CAD (coronary artery disease)   . Depression   . Diverticulosis 03/16/1995  . DJD (degenerative joint disease)    "most joints" (03/11/2015)  . GERD (gastroesophageal reflux disease) 12/30/2000  . Heart attack ([place]) 1984  . Hiatal hernia 12/30/2000  . History of blood transfusion 1988   "related to GI bleeding OR"  . History of duodenal ulcer 10/15/1986  . Hyperlipidemia   . Hypertension   . Sinus bradycardia   . Stroke [facility])    Past Surgical History:  Procedure Laterality Date  . CARDIAC CATHETERIZATION    . CATARACT EXTRACTION W/ INTRAOCULAR LENS   IMPLANT, BILATERAL Bilateral ?2013  . CHOLECYSTECTOMY OPEN  1989  . CORONARY ANGIOPLASTY WITH STENT PLACEMENT  2010  . hospital ccu  1984   heart attack, hypoplastic right coronary  . INGUINAL HERNIA REPAIR Right ~ 1990  . NASAL SEPTUM SURGERY  1972  . ORCHIECTOMY Bilateral 06/10/2017   Procedure: BILATERAL ORCHIECTOMY;  Surgeon: Franchot Gallo, MD;  Location: [facility];  Service: Urology;  Laterality: Bilateral;  MAC ANESTHESIA AND LOCAL  . TONSILLECTOMY    . TOTAL KNEE ARTHROPLASTY Bilateral 1998-2004   "right-left"  . VAGOTOMY AND PYLOROPLASTY  1988   "bleeding duodenal ulcer"    Current Outpatient Medications  Medication Sig Dispense Refill  . apixaban (ELIQUIS) 5 MG TABS tablet Take 1 tablet (5 mg total) by mouth 2 (two) times daily. 180 tablet 3  . ARNUITY ELLIPTA 200 MCG/ACT AEPB INHALE 1 PUFF INTO THE LUNGS DAILY (Patient taking differently: Inhale 1 puff into the lungs daily. ) 30 each 6  . atorvastatin (LIPITOR) 20 MG tablet TAKE 1 TABLET(20 MG) BY MOUTH DAILY 90 tablet 4  . benzonatate (TESSALON) 100 MG capsule Take 1 capsule (100 mg total) by mouth 3 (three) times daily. 20 capsule 0  . buPROPion (WELLBUTRIN SR) 150 MG 12 hr tablet Take 1 tablet (150 mg total) by mouth daily. 100 tablet 4  . cetirizine (ZYRTEC) 10 MG tablet Take 10 mg by mouth daily with supper.     Marland Kitchen  Cholecalciferol (VITAMIN D) 2000 units tablet Take 2,000 Units by mouth daily.    Marland Kitchen diltiazem (CARDIZEM) 30 MG tablet Take 30 mg by mouth as needed.    . dofetilide (TIKOSYN) 250 MCG capsule TAKE 1 CAPSULE(250 MCG) BY MOUTH TWICE DAILY (Patient taking differently: Take 250 mcg by mouth 2 (two) times daily. ) 180 capsule 0  . guaiFENesin (MUCINEX) 600 MG 12 hr tablet Take 1 tablet (600 mg total) by mouth 2 (two) times daily. 14 tablet 0  . LORazepam (ATIVAN) 1 MG tablet TAKE 1/2 TO 1 TABLET BY MOUTH EVERY NIGHT AT BEDTIME AS NEEDED 90 tablet 1  . metoprolol tartrate (LOPRESSOR) 25 MG tablet Take 1 tablet (25 mg  total) by mouth 2 (two) times daily. 60 tablet 0  . mometasone (NASONEX) 50 MCG/ACT nasal spray Place 1 spray into the nose daily as needed (for allergies).     . nitroGLYCERIN (NITROSTAT) 0.4 MG SL tablet Place 1 tablet (0.4 mg total) under the tongue every 5 (five) minutes x 3 doses as needed for chest pain. 25 tablet 2  . omeprazole (PRILOSEC OTC) 20 MG tablet Take 1 tablet (20 mg total) by mouth daily. 100 tablet 4  . oxyCODONE (ROXICODONE) 5 MG immediate release tablet Take 1 tablet (5 mg total) by mouth every 6 (six) hours as needed for severe pain. 20 tablet 0  . pantoprazole (PROTONIX) 20 MG tablet Take 1 tablet (20 mg total) by mouth daily. 30 tablet 0  . Propylene Glycol (SYSTANE BALANCE) 0.6 % SOLN Apply 1 drop to eye as needed (dry eyes).    . tamsulosin (FLOMAX) 0.4 MG CAPS capsule Take 1 capsule (0.4 mg total) by mouth daily. 90 capsule 4  . traMADol (ULTRAM) 50 MG tablet Take 1 tablet (50 mg total) by mouth every 12 (twelve) hours as needed. 120 tablet 5   No current facility-administered medications for this encounter.     Allergies  Allergen Reactions  . Tylenol [Acetaminophen] Rash  . Advil [Ibuprofen] Nausea Only and Rash  . Asa [Aspirin] Rash    Social History   Socioeconomic History  . Marital status: Married    Spouse name: Not on file  . Number of children: 2  . Years of education: Not on file  . Highest education level: Not on file  Occupational History  . Occupation: IT sales professional: RETIRED    Comment: radiologist  Social Needs  . Financial resource strain: Not hard at all  . Food insecurity:    Worry: Never true    Inability: Never true  . Transportation needs:    Medical: No    Non-medical: No  Tobacco Use  . Smoking status: Never Smoker  . Smokeless tobacco: Never Used  Substance and Sexual Activity  . Alcohol use: No  . Drug use: No  . Sexual activity: Not Currently  Lifestyle  . Physical activity:    Days per week: 0 days    Minutes  per session: 0 min  . Stress: Only a little  Relationships  . Social connections:    Talks on phone: More than three times a week    Gets together: Three times a week    Attends religious service: Not on file    Active member of club or organization: Yes    Attends meetings of clubs or organizations: More than 4 times per year    Relationship status: Married  . Intimate partner violence:    Fear of current or  ex partner: No    Emotionally abused: No    Physically abused: No    Forced sexual activity: No  Other Topics Concern  . Not on file  Social History Narrative   Married. Regular exercise - yes.       03/08/2018: Married to Therapist, sports, lives in 3 story home, although lives on first level mostly. Using rolling walker lately s/p hospitalization for PNA/flu, tripped over rug recently. Will be receiving PT in-home soon.       Has two grown children.      Enjoys golfing; drives to [place] with wife every winter for a few days    Family History  Problem Relation Age of Onset  . Coronary artery disease Mother        deceased  . Deep vein thrombosis Sister   . Coronary artery disease Brother   . Asthma Brother   . Colon cancer Neg Hx     ROS- All systems are reviewed and negative except as per the HPI above  Physical Exam: There were no vitals filed for this visit. Wt Readings from Last 3 Encounters:  03/29/18 73.8 kg  03/20/18 73 kg  03/08/18 73.9 kg    Labs: Lab Results  Component Value Date   NA 133 (L) 03/20/2018   K 3.7 03/20/2018   CL 100 03/20/2018   CO2 23 03/20/2018   GLUCOSE 124 (H) 03/20/2018   BUN 11 03/20/2018   CREATININE 0.94 03/20/2018   CALCIUM 8.0 (L) 03/20/2018   MG 1.9 03/07/2018   Lab Results  Component Value Date   INR 1.16 02/25/2018   Lab Results  Component Value Date   CHOL 94 09/19/2017   HDL 38 (L) 09/19/2017   LDLCALC 43 09/19/2017   TRIG 65 09/19/2017     GEN- The patient is well appearing, alert and oriented x 3 today.   Head-  normocephalic, atraumatic Eyes-  Sclera clear, conjunctiva pink Ears- hearing intact Oropharynx- clear Neck- supple, no JVP Lymph- no cervical lymphadenopathy Lungs- Clear to ausculation bilaterally, normal work of breathing Heart- Regular rate and rhythm, no murmurs, rubs or gallops, PMI not laterally displaced GI- soft, NT, ND, + BS Extremities- no clubbing, cyanosis, or edema MS- no significant deformity or atrophy Skin- no rash or lesion Psych- euthymic mood, full affect Neuro- strength and sensation are intact  EKG- Initial EKG showed afib at 93 bpm, qrs int m88 ms, qtc 494 ms Repeat ekg showed NSR at 71 bpm, pr int 158 ms, qrs int 64 bpm, qtc 430 ms.   Assessment and Plan:  1. Paroxysmal afib  Pt had afib this am for a few hours but converted in office with 30 mg Cardizem taken several hours before visit This break thorough of afib is very unusual for pt  Continue dofetilide 250 mcg bid and atenolol 25 mg daily, qtc stable Continue to use  cardizem 30 mg for breakthrough episodes Cbc/bmet/mag today  2. CHA2DS2VASc score of at least 6 Continue  eliquis 5 mg bid, appropriately dosed  F/u with Dr. Lovena Le 5/26  afib clinic as needed  Butch Penny C. Tomisha Reppucci, [facility] [address] [place], [place] [address] [phone]

## 2018-04-07 ENCOUNTER — Telehealth: Payer: Self-pay | Admitting: Family Medicine

## 2018-04-07 NOTE — Telephone Encounter (Signed)
Will send as FYI 

## 2018-04-07 NOTE — Telephone Encounter (Signed)
Copied from [place] [id number]. Topic: Quick Communication - See Telephone Encounter >> Apr 07, 2018 10:12 AM Rayann Heman wrote: Pr[facility]ob [facility] to report that patient had a fall on 04/07/18. Pt states that he fell and hit his nose. Pt has bruises on knees. Pt is up and walking today  BP today =120/76

## 2018-04-07 NOTE — Telephone Encounter (Signed)
FYI

## 2018-04-10 DIAGNOSIS — C61 Malignant neoplasm of prostate: Secondary | ICD-10-CM | POA: Diagnosis not present

## 2018-04-10 DIAGNOSIS — R351 Nocturia: Secondary | ICD-10-CM | POA: Diagnosis not present

## 2018-04-10 DIAGNOSIS — N401 Enlarged prostate with lower urinary tract symptoms: Secondary | ICD-10-CM | POA: Diagnosis not present

## 2018-04-21 ENCOUNTER — Other Ambulatory Visit (HOSPITAL_COMMUNITY): Payer: Self-pay | Admitting: Internal Medicine

## 2018-05-15 DIAGNOSIS — Z85828 Personal history of other malignant neoplasm of skin: Secondary | ICD-10-CM | POA: Diagnosis not present

## 2018-05-15 DIAGNOSIS — M5431 Sciatica, right side: Secondary | ICD-10-CM | POA: Diagnosis not present

## 2018-05-15 DIAGNOSIS — M9903 Segmental and somatic dysfunction of lumbar region: Secondary | ICD-10-CM | POA: Diagnosis not present

## 2018-05-15 DIAGNOSIS — M9905 Segmental and somatic dysfunction of pelvic region: Secondary | ICD-10-CM | POA: Diagnosis not present

## 2018-05-15 DIAGNOSIS — M5136 Other intervertebral disc degeneration, lumbar region: Secondary | ICD-10-CM | POA: Diagnosis not present

## 2018-06-14 ENCOUNTER — Encounter: Payer: Self-pay | Admitting: Family Medicine

## 2018-06-16 ENCOUNTER — Encounter: Payer: Self-pay | Admitting: Family Medicine

## 2018-06-28 ENCOUNTER — Other Ambulatory Visit (HOSPITAL_COMMUNITY): Payer: Self-pay | Admitting: Nurse Practitioner

## 2018-07-04 ENCOUNTER — Ambulatory Visit: Payer: PPO | Admitting: Internal Medicine

## 2018-07-07 ENCOUNTER — Telehealth: Payer: Self-pay | Admitting: Family Medicine

## 2018-07-07 MED ORDER — TRAMADOL HCL 50 MG PO TABS: 50.0000 mg | ORAL_TABLET | Freq: Two times a day (BID) | ORAL | 5 refills | Status: DC | PRN

## 2018-07-07 NOTE — Telephone Encounter (Signed)
Last OV 03/29/18, No future OV  Last filled 11/21/17, # 120 with 5 refills

## 2018-07-07 NOTE — Telephone Encounter (Signed)
Copied from [place] [id number]. Topic: Quick Communication - Rx Refill/Question >> Jul 07, 2018  9:52 AM Sheran Luz wrote: Medication: traMADol (ULTRAM) 50 MG tablet   Patient is requesting refill of this medication.    Preferred Pharmacy (with phone number or street name): [facility] [place], [place] [phone] (Phone) [phone] (Fax)

## 2018-07-14 ENCOUNTER — Telehealth: Payer: Self-pay | Admitting: Family Medicine

## 2018-07-14 NOTE — Telephone Encounter (Signed)
Copied from [place] [id number]. Topic: Quick Communication - Rx Refill/Question >> Jul 14, 2018  1:53 PM Rainey Pines A wrote: Medication: LORazepam (ATIVAN) 1 MG tablet   Has the patient contacted their pharmacy?Yes (Agent: If no, request that the patient contact the pharmacy for the refill.) (Agent: If yes, when and what did the pharmacy advise?)Contact PCP  Preferred Pharmacy (with phone number or street name):[facility] [place], [place] [address] [place] [phone] (Phone) [phone] (Fax)    Agent: Please be advised that RX refills may take up to 3 business days. We ask that you follow-up with your pharmacy.

## 2018-07-15 ENCOUNTER — Other Ambulatory Visit: Payer: Self-pay | Admitting: Family Medicine

## 2018-07-16 ENCOUNTER — Other Ambulatory Visit (HOSPITAL_COMMUNITY): Payer: Self-pay | Admitting: Cardiology

## 2018-07-17 ENCOUNTER — Encounter: Payer: Self-pay | Admitting: Family Medicine

## 2018-07-17 NOTE — Telephone Encounter (Signed)
Refilled earlier today.

## 2018-07-17 NOTE — Telephone Encounter (Signed)
Last OV 03/29/18  Last filled 01/18/2018 No upcoming appts  Please advise

## 2018-07-25 ENCOUNTER — Other Ambulatory Visit: Payer: Self-pay

## 2018-07-25 ENCOUNTER — Telehealth (INDEPENDENT_AMBULATORY_CARE_PROVIDER_SITE_OTHER): Payer: PPO | Admitting: Internal Medicine

## 2018-07-25 DIAGNOSIS — I2510 Atherosclerotic heart disease of native coronary artery without angina pectoris: Secondary | ICD-10-CM

## 2018-07-25 DIAGNOSIS — I10 Essential (primary) hypertension: Secondary | ICD-10-CM

## 2018-07-25 DIAGNOSIS — Z9861 Coronary angioplasty status: Secondary | ICD-10-CM

## 2018-07-25 DIAGNOSIS — I480 Paroxysmal atrial fibrillation: Secondary | ICD-10-CM

## 2018-07-25 NOTE — Progress Notes (Signed)
Electrophysiology TeleHealth Note   Due to national recommendations of social distancing due to COVID 19, an audio/video telehealth visit is felt to be most appropriate for this patient at this time.  See MyChart message from today for the patient's consent to telehealth for [facility].   Date:  07/25/2018   ID:  [name] Spence, MD, DOB [birth year], MRN [id number]  Location: patient's home  Provider location: [address] [place]  Evaluation Performed: Follow-up visit  PCP:  [name], MD  Cardiologist:  No primary care provider on file. 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  Since last being seen in our clinic, the patient reports doing very well. He was in the hospital a couple of months ago with pneumonia and had atrial fib.  Today, he denies symptoms of palpitations, chest pain, shortness of breath,  lower extremity edema, dizziness, presyncope, or syncope.  The patient is otherwise without complaint today.  The patient denies symptoms of fevers, chills, cough, or new SOB worrisome for COVID 19.  Past Medical History:  Diagnosis Date  . Allergic rhinitis   . Anxiety   . Asthma   . Atrial fibrillation ([place])   . CAD (coronary artery disease)   . Depression   . Diverticulosis 03/16/1995  . DJD (degenerative joint disease)    "most joints" (03/11/2015)  . GERD (gastroesophageal reflux disease) 12/30/2000  . Heart attack ([place]) 1984  . Hiatal hernia 12/30/2000  . History of blood transfusion 1988   "related to GI bleeding OR"  . History of duodenal ulcer 10/15/1986  . Hyperlipidemia   . Hypertension   . Sinus bradycardia   . Stroke [facility])     Past Surgical History:  Procedure Laterality Date  . CARDIAC CATHETERIZATION    . CATARACT EXTRACTION W/ INTRAOCULAR LENS   IMPLANT, BILATERAL Bilateral ?2013  . CHOLECYSTECTOMY OPEN  1989  . CORONARY ANGIOPLASTY WITH STENT PLACEMENT  2010  . hospital ccu  1984   heart attack, hypoplastic right coronary  . INGUINAL HERNIA REPAIR Right ~ 1990  . NASAL SEPTUM SURGERY  1972  . ORCHIECTOMY Bilateral 06/10/2017   Procedure: BILATERAL ORCHIECTOMY;  Surgeon: Franchot Gallo, MD;  Location[facility];  Service: Urology;  Laterality: Bilateral;  MAC ANESTHESIA AND LOCAL  . TONSILLECTOMY    . TOTAL KNEE ARTHROPLASTY Bilateral 1998-2004   "right-left"  . VAGOTOMY AND PYLOROPLASTY  1988   "bleeding duodenal ulcer"    Current Outpatient Medications  Medication Sig Dispense Refill  . apixaban (ELIQUIS) 5 MG TABS tablet Take 1 tablet (5 mg total) by mouth 2 (two) times daily. 180 tablet 3  . ARNUITY ELLIPTA 200 MCG/ACT AEPB INHALE 1 PUFF INTO THE LUNGS DAILY (Patient taking differently: Inhale 1 puff into the lungs daily. ) 30 each 6  . atenolol (TENORMIN) 25 MG tablet TAKE 1 TABLET BY MOUTH DAILY. 90 tablet 3  . atorvastatin (LIPITOR) 20 MG tablet TAKE 1 TABLET(20 MG) BY MOUTH DAILY 90 tablet 4  . benzonatate (TESSALON) 100 MG capsule Take 1 capsule (100 mg total) by mouth 3 (three) times daily. (Patient not taking: Reported on 04/04/2018) 20 capsule 0  . buPROPion (WELLBUTRIN SR) 150 MG 12 hr tablet Take 1 tablet (150 mg total) by mouth daily. 100 tablet 4  .  cetirizine (ZYRTEC) 10 MG tablet Take 10 mg by mouth daily with supper.     . Cholecalciferol (VITAMIN D) 2000 units tablet Take 2,000 Units by mouth daily.    Marland Kitchen diltiazem (CARDIZEM) 30 MG tablet Take 30 mg by mouth as needed.    . dofetilide (TIKOSYN) 250 MCG capsule TAKE 1 CAPSULE(250 MCG) BY MOUTH TWICE DAILY 180 capsule 0  . guaiFENesin (MUCINEX) 600 MG 12 hr tablet Take 1 tablet (600 mg total) by mouth 2 (two) times daily. (Patient taking differently: Take 600 mg by mouth daily. ) 14 tablet 0  . LORazepam (ATIVAN) 1 MG tablet TAKE 1/2 TO 1 TABLET BY MOUTH AT  BEDTIME AS NEEDED 90 tablet 0  . mometasone (NASONEX) 50 MCG/ACT nasal spray Place 1 spray into the nose daily as needed (for allergies).     . nitroGLYCERIN (NITROSTAT) 0.4 MG SL tablet Place 1 tablet (0.4 mg total) under the tongue every 5 (five) minutes x 3 doses as needed for chest pain. (Patient not taking: Reported on 04/04/2018) 25 tablet 2  . omeprazole (PRILOSEC OTC) 20 MG tablet Take 1 tablet (20 mg total) by mouth daily. 100 tablet 4  . Propylene Glycol (SYSTANE BALANCE) 0.6 % SOLN Apply 1 drop to eye as needed (dry eyes).    . tamsulosin (FLOMAX) 0.4 MG CAPS capsule Take 1 capsule (0.4 mg total) by mouth daily. 90 capsule 4  . traMADol (ULTRAM) 50 MG tablet Take 1 tablet (50 mg total) by mouth every 12 (twelve) hours as needed. 120 tablet 5   No current facility-administered medications for this visit.     Allergies:   Tylenol [acetaminophen], Azithromycin, Advil [ibuprofen], and Asa [aspirin]   Social History:  The patient  reports that he has never smoked. He has never used smokeless tobacco. He reports that he does not drink alcohol or use drugs.   Family History:  The patient's family history includes Asthma in his brother; Coronary artery disease in his brother and mother; Deep vein thrombosis in his sister.   ROS:  Please see the history of present illness.   All other systems are personally reviewed and negative.    Exam:    Vital Signs:  120/68, 60   Labs/Other Tests and Data Reviewed:    Recent Labs: 02/25/2018: ALT 21 04/04/2018: BUN 17; Creatinine, Ser 1.24; Hemoglobin 12.2; Magnesium 2.2; Platelets 371; Potassium 4.0; Sodium 138   Wt Readings from Last 3 Encounters:  04/04/18 156 lb (70.8 kg)  03/29/18 162 lb 9.6 oz (73.8 kg)  03/20/18 160 lb 15 oz (73 kg)     Other studies personally reviewed: none   ASSESSMENT & PLAN:    1.  Atrial fib - he is maintaining nsr. He will continue his dofetilide.  2. HTN - his pressuere   COVID 19 screen The patient  denies symptoms of COVID 19 at this time.  The importance of social distancing was discussed today.  Follow-up:  6 months Next remote: n/a  Current medicines are reviewed at length with the patient today.   The patient does not have concerns regarding his medicines.  The following changes were made today:  none  Labs/ tests ordered today include: none No orders of the defined types were placed in this encounter.    Patient Risk:  after full review of this patients clinical status, I feel that they are at moderate risk at this time.  Today, I have spent 15 minutes with the patient with telehealth technology  discussing all of the above .    Signed, Cristopher Peru, MD  07/25/2018 9:08 AM     [place] [address] [phone] (office) [phone] (fax)

## 2018-09-27 ENCOUNTER — Telehealth: Payer: Self-pay | Admitting: Internal Medicine

## 2018-09-27 NOTE — Telephone Encounter (Signed)
Called and spoke with patient regarding his refill request for Arnuity Ellipta. Pt last seen over 1 year ago 12/13/2016 by MR. I let pt know we would have to set up an appt w/ one of our providers for further refills. Pt expressed understanding. I offered pt a morning appt tomorrow with MR, however, pt states he wouldn't be able to be seen in the morning because he will be out of town. I then offered pt an appt for Friday 09/29/2018 with one of our nurse practitioners. Pt agreed to setting up an appt to see TP 09/29/2018 at 2:30 PM.   Appt has been scheduled. Routing to TP as an [place]. Nothing further needed at this time.

## 2018-09-28 ENCOUNTER — Ambulatory Visit: Payer: PPO | Admitting: Internal Medicine

## 2018-09-28 ENCOUNTER — Encounter: Payer: Self-pay | Admitting: Internal Medicine

## 2018-09-28 ENCOUNTER — Ambulatory Visit (INDEPENDENT_AMBULATORY_CARE_PROVIDER_SITE_OTHER)
Admission: RE | Admit: 2018-09-28 | Discharge: 2018-09-28 | Disposition: A | Discharge status: Home / Self Care | Payer: PPO | Source: Ambulatory Visit | Attending: Internal Medicine | Admitting: Internal Medicine

## 2018-09-28 ENCOUNTER — Other Ambulatory Visit: Payer: Self-pay

## 2018-09-28 VITALS — BP 124/64 | HR 61 | Ht 69.5 in | Wt 165.9 lb

## 2018-09-28 DIAGNOSIS — J984 Other disorders of lung: Secondary | ICD-10-CM | POA: Diagnosis not present

## 2018-09-28 DIAGNOSIS — Z8701 Personal history of pneumonia (recurrent): Secondary | ICD-10-CM

## 2018-09-28 DIAGNOSIS — J4530 Mild persistent asthma, uncomplicated: Secondary | ICD-10-CM | POA: Diagnosis not present

## 2018-09-28 DIAGNOSIS — I2510 Atherosclerotic heart disease of native coronary artery without angina pectoris: Secondary | ICD-10-CM | POA: Diagnosis not present

## 2018-09-28 DIAGNOSIS — I700 Atherosclerosis of aorta: Secondary | ICD-10-CM | POA: Diagnosis not present

## 2018-09-28 DIAGNOSIS — N2889 Other specified disorders of kidney and ureter: Secondary | ICD-10-CM | POA: Diagnosis not present

## 2018-09-28 DIAGNOSIS — R0982 Postnasal drip: Secondary | ICD-10-CM | POA: Diagnosis not present

## 2018-09-28 MED ORDER — FLUTICASONE PROPIONATE 50 MCG/ACT NA SUSP: 2.0000 | Freq: Every day | NASAL | 3 refills | Status: DC

## 2018-09-28 MED ORDER — ARNUITY ELLIPTA 200 MCG/ACT IN AEPB: 1.0000 | INHALATION_SPRAY | Freq: Every day | RESPIRATORY_TRACT | 0 refills | Status: DC

## 2018-09-28 MED ORDER — ARNUITY ELLIPTA 200 MCG/ACT IN AEPB: 1.0000 | INHALATION_SPRAY | Freq: Every day | RESPIRATORY_TRACT | 3 refills | Status: DC

## 2018-09-28 NOTE — Addendum Note (Signed)
Addended by: Jannette Spanner on: 09/28/2018 03:31 PM   Modules accepted: Orders

## 2018-09-28 NOTE — Progress Notes (Signed)
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 Followed in past for asthma w/ bronchitis. LOV around 2008. Medical hx CAD/ MI/CABG, AFib/ warfarin, diastolic dysfunction, GERD He did well for several years with help from Dr Sherren Mocha. Starting about 6 months ago with no obvious trigger, he began having increased wheeze, cough, needing SABA 2-3x/ day and Qvar only intermittently. He has been worse this Spring, blaming pollen. Sputum remains clear. Denies fever, nodes, blood, edema, chest pain. Hx  Intermittent AFib- no pacemaker Had BCG in medical school.         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 Followed in past for asthma w/ bronchitis.  Medical hx CAD/ MI/CABG,P AFib/ warfarin, diastolic dysfunction, GERD. Dr Coralie Keens alerted [place] that pt was having more trouble, so we could work him in today. Follows For: Pt c/o wheezing and SOB, prod cough with thick yellow mucus. States he had asthma flare up last night. Has has had 3 episodes of A-fib in last week, most recent was 06/18/14.  Persistent variable chest rattle has been going on almost since he was last seen here. Using albuterol rescue inhaler once or twice daily. He has caught a cold from his wife and overnight began or persistent wheeze, using rescue inhaler 4 times daily and continuing his Qvar. Transient relief only. Sputum trace yellow. No fever. He has a little left over prednisone but reports that 20 mg of prednisone daily is enough of a stimulant to trigger his atrial fib.  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  Medical hx CAD/ MI/CABG,P AFib/ warfarin, diastolic dysfunction, GERD Follow For: Pt doing well since last visit. Still post nasal drip. Denies wheezing, SOB, Prod cough.  Has not  needed rescue inhaler in months and denies wheeze since a cold in early spring. Using Qvar twice daily and Singulair. Notices some hoarseness, mouth breathing  Told he has some pulmonary hypertension. We discussed possible role of oxygen desaturation. Does not recognize symptoms of sleep apnea. CXR 06/24/14 IMPRESSION: No active cardiopulmonary disease. Electronically Signed  By: Franchot Gallo M.D.  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 Medical history CAD/MI/CABG, P A. fib/warfarin, diastolic dysfunction, GERD FOLLOWS FOR: Pt states he has had very little SOB or wheezing since last OV. Has had trouble with Afib. He has continued Flovent 110 and pro air rescue inhaler twice daily each. Has not recognized any association of atrial fib or palpitations with use of albuterol. [name] last year with no particular benefit recognized over his current regimen. He asks about persistent hoarseness. Overnight Oximetry 09/30/2014-normal, not qualifying for sleep O2   OV 10/23/2015  Chief Complaint  Patient presents with  . Hospitalization Follow-up    Pt recently hospitalized for LLL CAP. Pt c/o prod cough with green, blood streaked mucus.  Pt discharged yesterday.     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 In June 2017 he had worsening asthma symptoms and he saw Dr. Hardie Pulley and allergy. According to his history exhaled nitric oxide at this visit was elevated at 70 ppb. He was then started on inhaled corticosteroid ARNUITY . Marland Kitchen Up until that point he was only taking her steroids as needed. After that he started taking her steroids regular basis. At follow-up his   He maintain himself on the inhaled steroid. Apparently blood  allergy panel was negative. He was then leading his baseline life with control of atrial fibrillation. However on 10/19/2015 he got hospitalized for left lower lobe pneumonia. Personally visualized the  chest x-ray review the records and agree with the findings. It sounds like he had bacterial pneumonia not otherwise specified. He was discharged yesterday. He still on Augmentin. He has postnasal drainage but is not taking any nasal steroids. He has nocturnal acid reflux and is on fish oil. He is feeling back to baseline other than some post pneumonia fatigue. He also feels his atrial fibrillation is somewhat out of control. He has some occasional streaky hemoptysis but this is improving.   OV 12/03/2015  Chief Complaint  Patient presents with  . Follow-up    Pt states his SOB is at baseline - doing well. Pt denies cough, CP/tightness, and f/c/s.     Follow-up left lower lobe pneumonia.  He was hospitalized 10/19/2015 for left lower lobe pneumonia. This is clinical follow-up for the same. He no longer has hemoptysis since his last office visit mid September 2017. He continues to feel well. He is asymptomatic without any shortness of breath wheezing or chest tightness or cough. Only she is was nasal drip that bothers him. He tells me that a year ago he had sinus scan and it showed significant sinus congestion. He is followed up with ENT in the past but is not in a while. Inhaled steroids to the nose helping him but not fully controlled this. He says he will follow-up with ENT for the same. But at this point overall he is feeling fine and playing golf. He is compliant with his inhaled corticosteroid ARNUITY. He will need a chest x-ray today. He is up-to-date with his flu shot.   In interim he did see Dr Aundra Dubin for  Afib and notes reviewed   [place] 06/07/2016  Chief Complaint  Patient presents with  . Follow-up    Pt states his breathing has been doing well. Pt c/o prod cough with clear mucus in morning - baseline for pt. Pt denies CP/tightness and f/c/s.    Follow-up mild persistent asthma on inhaled corticosteroid ARNUITY: He is currently feeling well. Symptoms are extremely well controlled. He  visited his allergist Dr. Orvil Feil in March 2018 and his nitric oxide was 17 according to his history. There no new issues. He wants samples of his inhaled steroid because he is in the donut hole  Follow-up allergic rhinitis: This continues to bother him. It is not any worse. It is stable and mild. He is on inhaled nasal steroid and inhaled antihistamine. Despite nasal drainage and he coughs early in the.  morning . He is willing to add on nasal saline  Follow-up pneumonia: He had this last year: Follow-up chest x-ray February 2018 showed resolution. We recommended a CT scan chest for follow-up but he opted to have a chest x-ray in instead    COMPARISON:  Chest CT 01/05/2016.  Chest radiographs 12/03/2015. IMPRESSION: 1. Previous bilateral bronchopneumonia appears resolved. No acute cardiopulmonary abnormality. 2.  Calcified aortic atherosclerosis.   Electronically Signed   By: Genevie Ann M.D.   On: 04/02/2016 12:01    OV 12/13/2016  Chief Complaint  Patient presents with  . Follow-up    Pt states that he has been doing good; states he has not had any flare-ups with his asthma. Denies an cough, SOB, or CP.    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 Last seen 6 months ago. He continues to do well asthma control questionnaire is 0 out of 5. He does not have any nocturnal symptoms. When he wakes up he does not have any symptoms is not limited in his activities because of asthma. Is not having shortness of breath or wheezing or nocturnal symptoms no albuterol rescue use. He is up-to-date with his flu shot he continues to play golf. He is okay spacing out his follow-ups for every 9 months   OV 09/28/2018  Subjective:  Patient ID: [name] Spence, MD, male , DOB: Jun 29, 1929 , age 44 y.o. , MRN: [id number] , ADDRESS: [place] [address]   09/28/2018 -   Chief  Complaint  Patient presents with  . 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 - mild persistsent asthmatic on arnuity. Last seen nov 2018. Then called for refill and asked to come in because has been 2 year. This is first MD visit since onsent of pandemic. Says in jan 2020 had LLL pneumonia and A fib (confirmed on personal visualization of cxr). AFter that doing well. Playing golf. Has chronic post nasal drip - needing oral antihistamine, flonase and astelazine spray but still mild and peristent. ASthma well controlle.d ACQ is 0.2 of 5. Not waking up at night. When waking up barely any symptoms. Not limited in activities. No dyspnea. Just occ baseline wheee (has on exam too). No alb rescue use     ROS - per HPI     has a past medical history of Allergic rhinitis, Anxiety, Asthma, Atrial fibrillation ([place]), CAD (coronary artery disease), Depression, Diverticulosis (03/16/1995), DJD (degenerative joint disease), GERD (gastroesophageal reflux disease) (12/30/2000), Heart attack ([place]) (1984), Hiatal hernia (12/30/2000), History of blood transfusion (1988), History of duodenal ulcer (10/15/1986), Hyperlipidemia, Hypertension, Sinus bradycardia, and Stroke ([place]).   reports that he has never smoked. He has never used smokeless tobacco.  Past Surgical History:  Procedure Laterality Date  . CARDIAC CATHETERIZATION    . CATARACT EXTRACTION W/ INTRAOCULAR LENS  IMPLANT, BILATERAL Bilateral ?2013  . CHOLECYSTECTOMY OPEN  1989  . CORONARY ANGIOPLASTY WITH STENT PLACEMENT  2010  . hospital ccu  1984   heart attack, hypoplastic right coronary  . INGUINAL HERNIA REPAIR Right ~ 1990  . NASAL SEPTUM SURGERY  1972  . ORCHIECTOMY Bilateral 06/10/2017   Procedure: BILATERAL ORCHIECTOMY;  Surgeon: Franchot Gallo, MD;  Location: [facility];  Service: Urology;  Laterality: Bilateral;  MAC ANESTHESIA AND LOCAL  . TONSILLECTOMY    . TOTAL KNEE ARTHROPLASTY Bilateral 1998-2004   "right-left"  . VAGOTOMY AND  PYLOROPLASTY  1988   "bleeding duodenal ulcer"    Allergies  Allergen Reactions  . Tylenol [Acetaminophen] Rash  . Azithromycin   . Advil [Ibuprofen] Nausea Only and Rash  . Asa [Aspirin] Rash    Immunization History  Administered Date(s) Administered  . Influenza Split 10/23/2012, 10/02/2015  . Influenza Whole 10/27/2007, 11/13/2008, 10/29/2009, 11/09/2010  . Influenza, High Dose Seasonal PF 10/28/2016  . Influenza,inj,Quad PF,6+ Mos 10/19/2017  . Influenza-Unspecified 12/09/2013, 10/28/2014  . Pneumococcal Conjugate-13 09/17/2013  . Pneumococcal Polysaccharide-23 08/30/2000, 11/29/2005  . Td 06/20/2007  . Tdap 01/11/2018  . Zoster 06/20/2007  . Zoster Recombinat (Shingrix) 11/07/2017, 11/22/2017, 01/11/2018    Family History  Problem Relation Age of Onset  . Coronary artery disease Mother        deceased  . Deep vein thrombosis Sister   .  Coronary artery disease Brother   . Asthma Brother   . Colon cancer Neg Hx      Current Outpatient Medications:  .  apixaban (ELIQUIS) 5 MG TABS tablet, Take 1 tablet (5 mg total) by mouth 2 (two) times daily., Disp: 180 tablet, Rfl: 3 .  atenolol (TENORMIN) 25 MG tablet, TAKE 1 TABLET BY MOUTH DAILY., Disp: 90 tablet, Rfl: 3 .  atorvastatin (LIPITOR) 20 MG tablet, TAKE 1 TABLET(20 MG) BY MOUTH DAILY, Disp: 90 tablet, Rfl: 4 .  buPROPion (WELLBUTRIN SR) 150 MG 12 hr tablet, Take 1 tablet (150 mg total) by mouth daily., Disp: 100 tablet, Rfl: 4 .  cetirizine (ZYRTEC) 10 MG tablet, Take 10 mg by mouth daily with supper. , Disp: , Rfl:  .  Cholecalciferol (VITAMIN D) 2000 units tablet, Take 2,000 Units by mouth daily., Disp: , Rfl:  .  diltiazem (CARDIZEM) 30 MG tablet, Take 30 mg by mouth as needed., Disp: , Rfl:  .  dofetilide (TIKOSYN) 250 MCG capsule, TAKE 1 CAPSULE(250 MCG) BY MOUTH TWICE DAILY, Disp: 180 capsule, Rfl: 0 .  Fluticasone Furoate (ARNUITY ELLIPTA) 200 MCG/ACT AEPB, Inhale 1 puff into the lungs daily., Disp: 90 each,  Rfl: 3 .  LORazepam (ATIVAN) 1 MG tablet, TAKE 1/2 TO 1 TABLET BY MOUTH AT BEDTIME AS NEEDED, Disp: 90 tablet, Rfl: 0 .  mometasone (NASONEX) 50 MCG/ACT nasal spray, Place 1 spray into the nose daily as needed (for allergies). , Disp: , Rfl:  .  nitroGLYCERIN (NITROSTAT) 0.4 MG SL tablet, Place 1 tablet (0.4 mg total) under the tongue every 5 (five) minutes x 3 doses as needed for chest pain., Disp: 25 tablet, Rfl: 2 .  omeprazole (PRILOSEC OTC) 20 MG tablet, Take 1 tablet (20 mg total) by mouth daily., Disp: 100 tablet, Rfl: 4 .  Propylene Glycol (SYSTANE BALANCE) 0.6 % SOLN, Apply 1 drop to eye as needed (dry eyes)., Disp: , Rfl:  .  tamsulosin (FLOMAX) 0.4 MG CAPS capsule, Take 1 capsule (0.4 mg total) by mouth daily., Disp: 90 capsule, Rfl: 4 .  traMADol (ULTRAM) 50 MG tablet, Take 1 tablet (50 mg total) by mouth every 12 (twelve) hours as needed., Disp: 120 tablet, Rfl: 5 .  fluticasone (FLONASE) 50 MCG/ACT nasal spray, Place 2 sprays into both nostrils daily., Disp: 48 g, Rfl: 3      Objective:   Vitals:   09/28/18 0902  BP: 124/64  Pulse: 61  SpO2: 100%  Weight: 165 lb 14.4 oz (75.3 kg)  Height: 5' 9.5" (1.765 m)    Estimated body mass index is 24.15 kg/m as calculated from the following:   Height as of this encounter: 5' 9.5" (1.765 m).   Weight as of this encounter: 165 lb 14.4 oz (75.3 kg).  _0 @  [employer]   09/28/18 0902  Weight: 165 lb 14.4 oz (75.3 kg)     Physical Exam  General Appearance:    Alert, cooperative, no distress, appears stated age - yes , Deconditioned looking - no , OBESE  - no, Sitting on Wheelchair -  no  Head:    Normocephalic, without obvious abnormality, atraumatic  Eyes:    PERRL, conjunctiva/corneas clear,  Ears:    Normal TM's and external ear canals, both ears  Nose:   Nares normal, septum midline, mucosa normal, no drainage    or sinus tenderness. OXYGEN ON  - no . Patient is @ ra   Throat:   Lips, mucosa, and tongue  normal; teeth and gums normal. Cyanosis on lips - no  Neck:   Supple, symmetrical, trachea midline, no adenopathy;    thyroid:  no enlargement/tenderness/nodules; no carotid   bruit or JVD  Back:     Symmetric, no curvature, ROM normal, no CVA tenderness  Lungs:     Distress - no , Wheeze YES - at 2 spots bilateral Upper lobe posteriorly, Barrell Chest - no, Purse lip breathing - no, Crackles - no   Chest Wall:    No tenderness or deformity.    Heart:    Regular rate and rhythm, S1 and S2 normal, no rub   or gallop, Murmur - no  Breast Exam:    NOT DONE  Abdomen:     Soft, non-tender, bowel sounds active all four quadrants,    no masses, no organomegaly. Visceral obesity - no  Genitalia:   NOT DONE  Rectal:   NOT DONE  Extremities:   Extremities - normal, Has Cane - no, Clubbing - no, Edema - no  Pulses:   2+ and symmetric all extremities  Skin:   Stigmata of Connective Tissue Disease - no  Lymph nodes:   Cervical, supraclavicular, and axillary nodes normal  Psychiatric:  Neurologic:   Pleasant - yes, Anxious - no, Flat affect - no  CAm-ICU - neg, Alert and Oriented x 3 - yes, Moves all 4s - yes, Speech - normal, Cognition - intact           Assessment:       ICD-10-CM   1. Mild persistent asthma without complication  T61.44   2. Post-nasal drip  R09.82   3. History of pneumonia  Z87.01 CT Chest Wo Contrast       Plan:     Patient Instructions     ICD-10-CM   1. Mild persistent asthma without complication  R15.40   2. Post-nasal drip  R09.82   3. History of pneumonia  Z87.01    Asthma: stable. ->   Plan continue arunuity daily. Flu shot in fall  Post nasal drip: mild, persistent.  Plan  Continue nasal inhalers as before  History of pneumonia  - Jan 2020:  Glad you are better but there is no followup imaging  Plan CT chest without contrast next few days to few weeks - will call with result  (CT preferred over cxr)  Followup 6 months or sooner if needed      SIGNATURE    Dr. Brand Males, M.D., F.C.C.P,  Pulmonary and Critical Care Medicine Staff Physician, [place] Director - Interstitial Lung Disease  Program  Pulmonary [place] at [place], [place], [address]  Pager: [phone], If no answer or between  15:00h - 7:00h: call [phone] Telephone: [phone]  9:41 AM 09/28/2018

## 2018-09-28 NOTE — Patient Instructions (Signed)
ICD-10-CM   1. Mild persistent asthma without complication  Q58.48   2. Post-nasal drip  R09.82   3. History of pneumonia  Z87.01    Asthma: stable. ->   Plan continue arunuity daily. Flu shot in fall  Post nasal drip: mild, persistent.  Plan  Continue nasal inhalers as before  History of pneumonia  - Jan 2020:  Glad you are better but there is no followup imaging  Plan CT chest without contrast next few days to few weeks - will call with result  (CT preferred over cxr)  Followup 6 months or sooner if needed

## 2018-09-29 ENCOUNTER — Ambulatory Visit: Payer: PPO | Admitting: Adult Health

## 2018-10-09 ENCOUNTER — Telehealth: Payer: Self-pay | Admitting: Family Medicine

## 2018-10-09 NOTE — Telephone Encounter (Signed)
I have not seen this order yet.

## 2018-10-09 NOTE — Telephone Encounter (Signed)
Called patient and LMOVM to return call  [place] for [facility] to Discuss results / PCP / recommendations / Schedule patient  Left a detailed message for Tammy to have the patient call and set up a Doxy virtual visit for documentation to order the hospital bed.  CRM Created.

## 2018-10-09 NOTE — Telephone Encounter (Signed)
Copied from [place] [id number]. Topic: General - Other >> Oct 09, 2018 12:22 PM Yvette Rack wrote: Reason for CRM: Tammy with [employer] stated she will be faxing a request for office notes to support the request for a hospital bed. Tammy stated that [place] requested a hospital bed for the patient but they need notes to support that request.  Cb# [phone]

## 2018-10-09 NOTE — Telephone Encounter (Signed)
I have no recollection of ordering.  We may have to set up Doxy to document

## 2018-10-11 ENCOUNTER — Other Ambulatory Visit: Payer: Self-pay | Admitting: Family Medicine

## 2018-10-11 ENCOUNTER — Other Ambulatory Visit (HOSPITAL_COMMUNITY): Payer: Self-pay | Admitting: Cardiology

## 2018-10-11 NOTE — Telephone Encounter (Signed)
Last OV 03/29/18, No future OV  Last filled 07/17/18, # 90 with 0 refills

## 2018-10-19 ENCOUNTER — Encounter: Payer: Self-pay | Admitting: Family Medicine

## 2018-10-19 NOTE — Telephone Encounter (Signed)
Left message on machine for patient to schedule a virtual visit for orders for a hospital bed.  CRM

## 2018-11-01 ENCOUNTER — Other Ambulatory Visit (HOSPITAL_COMMUNITY): Payer: Self-pay | Admitting: Cardiology

## 2018-11-13 ENCOUNTER — Ambulatory Visit (INDEPENDENT_AMBULATORY_CARE_PROVIDER_SITE_OTHER): Payer: PPO | Admitting: Family Medicine

## 2018-11-13 ENCOUNTER — Encounter: Payer: Self-pay | Admitting: Family Medicine

## 2018-11-13 ENCOUNTER — Other Ambulatory Visit: Payer: Self-pay

## 2018-11-13 VITALS — BP 120/62 | HR 52 | Temp 97.0°F | Wt 160.1 lb

## 2018-11-13 DIAGNOSIS — M542 Cervicalgia: Secondary | ICD-10-CM | POA: Diagnosis not present

## 2018-11-13 DIAGNOSIS — M159 Polyosteoarthritis, unspecified: Secondary | ICD-10-CM

## 2018-11-13 DIAGNOSIS — M8949 Other hypertrophic osteoarthropathy, multiple sites: Secondary | ICD-10-CM | POA: Diagnosis not present

## 2018-11-13 NOTE — Progress Notes (Signed)
Subjective:     Patient ID: [name] Spence, MD, male   DOB: Jul 08, 1929, 85 y.o.   MRN: [id number]  HPI Patient had recurrence of mostly left-sided neck pain and stiffness last week very similar to episode he had last February.  He had gone to the ED back on February 10 with neck pain and headache concern for torticollis.  CT head and CT cervical spine showed no acute changes.  He was not placed on any medications at that point I took some low-dose hydrocodone which seemed to help.  Most recent episode started last week symptoms were particularly bad about Thursday but by Friday his symptoms had improved and in fact he did some practice golf swings on Friday.  His pain is left neck with some radiation toward the scapula.  No radiculitis symptoms.  No numbness.  No weakness.  No cough.  No fever.  No pleuritic pain.  No dyspnea.  They tried some topical heat and topical ointment initially but he had difficulty with application because of pain.  But not noted any rashes.  He feels back to baseline at this time.  He relates his brother who is 2 years younger died this past 2022-04-01.  His funeral [place] down the [place] later this week but patient is reluctant to go because of COVID risk  He has some general osteoarthritis pains and takes tramadol as needed for that.  Does not need refills.  Still has about 15 oxycodone which he uses very sparingly for severe neck pain  Past Medical History:  Diagnosis Date  . Allergic rhinitis   . Anxiety   . Asthma   . Atrial fibrillation ([place])   . CAD (coronary artery disease)   . Depression   . Diverticulosis 03/16/1995  . DJD (degenerative joint disease)    "most joints" (03/11/2015)  . GERD (gastroesophageal reflux disease) 12/30/2000  . Heart attack ([place]) 1984  . Hiatal hernia 12/30/2000  . History of blood transfusion 1988   "related to GI bleeding OR"  . History of duodenal ulcer 10/15/1986  . Hyperlipidemia   . Hypertension   . Sinus  bradycardia   . Stroke [facility])    Past Surgical History:  Procedure Laterality Date  . CARDIAC CATHETERIZATION    . CATARACT EXTRACTION W/ INTRAOCULAR LENS  IMPLANT, BILATERAL Bilateral ?2013  . CHOLECYSTECTOMY OPEN  1989  . CORONARY ANGIOPLASTY WITH STENT PLACEMENT  2010  . hospital ccu  1984   heart attack, hypoplastic right coronary  . INGUINAL HERNIA REPAIR Right ~ 1990  . NASAL SEPTUM SURGERY  1972  . ORCHIECTOMY Bilateral 06/10/2017   Procedure: BILATERAL ORCHIECTOMY;  Surgeon: Franchot Gallo, MD;  Location: [facility];  Service: Urology;  Laterality: Bilateral;  MAC ANESTHESIA AND LOCAL  . TONSILLECTOMY    . TOTAL KNEE ARTHROPLASTY Bilateral 1998-2004   "right-left"  . VAGOTOMY AND PYLOROPLASTY  1988   "bleeding duodenal ulcer"    reports that he has never smoked. He has never used smokeless tobacco. He reports that he does not drink alcohol or use drugs. family history includes Asthma in his brother; Coronary artery disease in his brother and mother; Deep vein thrombosis in his sister. Allergies  Allergen Reactions  . Tylenol [Acetaminophen] Rash  . Azithromycin   . Advil [Ibuprofen] Nausea Only and Rash  . Asa [Aspirin] Rash     Review of Systems  Constitutional: Negative for appetite change, chills, fever and unexpected weight change.  Respiratory: Negative for  cough and shortness of breath.   Cardiovascular: Negative for chest pain.  Musculoskeletal: Positive for neck pain and neck stiffness. Negative for myalgias.  Skin: Negative for rash.  Neurological: Negative for headaches.  Psychiatric/Behavioral: Negative for confusion.       Objective:   Physical Exam Constitutional:      Appearance: Normal appearance.  Neck:     Comments: He has somewhat limited range of motion with rotation to the right or left but this is his baseline.  No spinal tenderness. Cardiovascular:     Rate and Rhythm: Normal rate and regular rhythm.  Pulmonary:     Effort: Pulmonary  effort is normal.     Breath sounds: Normal breath sounds.  Musculoskeletal:     Comments: No trapezius tenderness.  No periscapular tenderness.  Skin:    Findings: No rash.  Neurological:     Mental Status: He is alert.     Comments: No focal weakness involving upper extremities.  Symmetric reflexes.        Assessment:     #1 recurrent neck pain last week.  Question degenerative.  Was not describing any focal neurologic deficits or radiculitis type symptoms.  Improved at this time.  #2 osteoarthritis    Plan:     -Continue tramadol as needed for his osteoarthritis pains. -He has limited oxycodone to use for severe neck pain. -Follow-up immediately for any recurrent pain or any neurologic symptoms such as numbness or weakness -Continue golf and other daily activities as tolerated  [name] MD [facility]

## 2018-11-22 DIAGNOSIS — C61 Malignant neoplasm of prostate: Secondary | ICD-10-CM | POA: Diagnosis not present

## 2018-11-24 DIAGNOSIS — D1801 Hemangioma of skin and subcutaneous tissue: Secondary | ICD-10-CM | POA: Diagnosis not present

## 2018-11-24 DIAGNOSIS — L821 Other seborrheic keratosis: Secondary | ICD-10-CM | POA: Diagnosis not present

## 2018-11-24 DIAGNOSIS — L723 Sebaceous cyst: Secondary | ICD-10-CM | POA: Diagnosis not present

## 2018-11-24 DIAGNOSIS — Z85828 Personal history of other malignant neoplasm of skin: Secondary | ICD-10-CM | POA: Diagnosis not present

## 2018-12-10 DIAGNOSIS — A419 Sepsis, unspecified organism: Secondary | ICD-10-CM

## 2018-12-10 HISTORY — DX: Sepsis, unspecified organism: A41.9

## 2018-12-11 ENCOUNTER — Other Ambulatory Visit: Payer: Self-pay | Admitting: Family Medicine

## 2018-12-11 DIAGNOSIS — C61 Malignant neoplasm of prostate: Secondary | ICD-10-CM | POA: Diagnosis not present

## 2018-12-11 DIAGNOSIS — N401 Enlarged prostate with lower urinary tract symptoms: Secondary | ICD-10-CM

## 2018-12-11 DIAGNOSIS — R351 Nocturia: Secondary | ICD-10-CM | POA: Diagnosis not present

## 2018-12-11 DIAGNOSIS — J45909 Unspecified asthma, uncomplicated: Secondary | ICD-10-CM

## 2018-12-11 NOTE — Telephone Encounter (Signed)
Requested medication (s) are due for refill today: yes  Requested medication (s) are on the active medication list: yes  Last refill:  11/21/2017  Future visit scheduled: no  Notes to clinic: last filled by different provider   Requested Prescriptions  Pending Prescriptions Disp Refills   atorvastatin (LIPITOR) 20 MG tablet 90 tablet 4    Sig: TAKE 1 TABLET(20 MG) BY MOUTH DAILY     Cardiovascular:  Antilipid - Statins Failed - 12/11/2018  9:38 AM      Failed - Total Cholesterol in normal range and within 360 days    Cholesterol  Date Value Ref Range Status  09/19/2017 94 0 - 200 mg/dL Final         Failed - LDL in normal range and within 360 days    LDL Cholesterol  Date Value Ref Range Status  09/19/2017 43 0 - 99 mg/dL Final    Comment:           Total Cholesterol/HDL:CHD Risk Coronary Heart Disease Risk Table                     Men   Women  1/2 Average Risk   3.4   3.3  Average Risk       5.0   4.4  2 X Average Risk   9.6   7.1  3 X Average Risk  23.4   11.0        Use the calculated Patient Ratio above and the CHD Risk Table to determine the patient's CHD Risk.        ATP III CLASSIFICATION (LDL):  <100     mg/dL   Optimal  100-129  mg/dL   Near or Above                    Optimal  130-159  mg/dL   Borderline  160-189  mg/dL   High  >190     mg/dL   Very High Performed at [facility] [address], [place], [place] [address]          Failed - HDL in normal range and within 360 days    HDL  Date Value Ref Range Status  09/19/2017 38 (L) >40 mg/dL Final         Failed - Triglycerides in normal range and within 360 days    Triglycerides  Date Value Ref Range Status  09/19/2017 65 <150 mg/dL Final         Passed - Patient is not pregnant      Passed - Valid encounter within last 12 months    Recent Outpatient Visits          4 weeks ago Neck pain   [place] [facility], Alinda Sierras, MD   8 months ago Lobar pneumonia  (HCC)-left lower [place] at [facility], Alinda Sierras, MD   9 months ago Lobar pneumonia (HCC)-left lower [place] [facility], MD   11 months ago Right wrist pain   [place], Olevia Bowens, DO   1 year ago Essential hypertension   [facility]rapist, [facility] at [facility], MD              tamsulosin (FLOMAX) 0.4 MG CAPS capsule 90 capsule 4    Sig: Take 1 capsule (0.4 mg total) by mouth daily.  Urology: Alpha-Adrenergic Blocker Passed - 12/11/2018  9:38 AM      Passed - Last BP in normal range    BP Readings from Last 1 Encounters:  11/13/18 120/62         Passed - Valid encounter within last 12 months    Recent Outpatient Visits          4 weeks ago Neck pain   [place] [facility], MD   8 months ago Lobar pneumonia (HCC)-left lower [place] [facility], MD   9 months ago Lobar pneumonia (HCC)-left lower [place] at [facility], MD   11 months ago Right wrist pain   [place], Olevia Bowens, DO   1 year ago Essential hypertension   Therapist, [facility] at [facility], Jory Ee, MD

## 2018-12-11 NOTE — Telephone Encounter (Signed)
tamsulosin (FLOMAX) 0.4 MG CAPS capsule  atorvastatin (LIPITOR) 20 MG tablet    Patient is requesting refill.    [place] [place] [facility] [place]

## 2018-12-11 NOTE — Telephone Encounter (Signed)
Please see patient refill request.

## 2018-12-12 ENCOUNTER — Other Ambulatory Visit: Payer: Self-pay | Admitting: Family Medicine

## 2018-12-12 DIAGNOSIS — J45909 Unspecified asthma, uncomplicated: Secondary | ICD-10-CM

## 2018-12-12 DIAGNOSIS — N401 Enlarged prostate with lower urinary tract symptoms: Secondary | ICD-10-CM

## 2018-12-12 MED ORDER — TAMSULOSIN HCL 0.4 MG PO CAPS: 0.4000 mg | ORAL_CAPSULE | Freq: Every day | ORAL | 0 refills | Status: DC

## 2018-12-12 MED ORDER — ATORVASTATIN CALCIUM 20 MG PO TABS: ORAL_TABLET | ORAL | 0 refills | Status: DC

## 2018-12-13 ENCOUNTER — Ambulatory Visit (HOSPITAL_COMMUNITY)
Admission: RE | Admit: 2018-12-13 | Discharge: 2018-12-13 | Disposition: A | Discharge status: Home / Self Care | Payer: PPO | Source: Ambulatory Visit | Attending: Cardiology | Admitting: Cardiology

## 2018-12-13 ENCOUNTER — Encounter (HOSPITAL_COMMUNITY): Payer: Self-pay | Admitting: Cardiology

## 2018-12-13 ENCOUNTER — Other Ambulatory Visit: Payer: Self-pay

## 2018-12-13 VITALS — BP 120/80 | HR 62 | Wt 160.6 lb

## 2018-12-13 DIAGNOSIS — Z79899 Other long term (current) drug therapy: Secondary | ICD-10-CM | POA: Insufficient documentation

## 2018-12-13 DIAGNOSIS — K219 Gastro-esophageal reflux disease without esophagitis: Secondary | ICD-10-CM | POA: Insufficient documentation

## 2018-12-13 DIAGNOSIS — Z955 Presence of coronary angioplasty implant and graft: Secondary | ICD-10-CM | POA: Insufficient documentation

## 2018-12-13 DIAGNOSIS — J45909 Unspecified asthma, uncomplicated: Secondary | ICD-10-CM | POA: Insufficient documentation

## 2018-12-13 DIAGNOSIS — I5032 Chronic diastolic (congestive) heart failure: Secondary | ICD-10-CM | POA: Insufficient documentation

## 2018-12-13 DIAGNOSIS — I2510 Atherosclerotic heart disease of native coronary artery without angina pectoris: Secondary | ICD-10-CM | POA: Diagnosis not present

## 2018-12-13 DIAGNOSIS — Z8546 Personal history of malignant neoplasm of prostate: Secondary | ICD-10-CM | POA: Insufficient documentation

## 2018-12-13 DIAGNOSIS — Z9079 Acquired absence of other genital organ(s): Secondary | ICD-10-CM | POA: Insufficient documentation

## 2018-12-13 DIAGNOSIS — Z8673 Personal history of transient ischemic attack (TIA), and cerebral infarction without residual deficits: Secondary | ICD-10-CM | POA: Insufficient documentation

## 2018-12-13 DIAGNOSIS — N189 Chronic kidney disease, unspecified: Secondary | ICD-10-CM | POA: Diagnosis not present

## 2018-12-13 DIAGNOSIS — Z7951 Long term (current) use of inhaled steroids: Secondary | ICD-10-CM | POA: Diagnosis not present

## 2018-12-13 DIAGNOSIS — Z8249 Family history of ischemic heart disease and other diseases of the circulatory system: Secondary | ICD-10-CM | POA: Diagnosis not present

## 2018-12-13 DIAGNOSIS — I130 Hypertensive heart and chronic kidney disease with heart failure and stage 1 through stage 4 chronic kidney disease, or unspecified chronic kidney disease: Secondary | ICD-10-CM | POA: Diagnosis not present

## 2018-12-13 DIAGNOSIS — I480 Paroxysmal atrial fibrillation: Secondary | ICD-10-CM | POA: Diagnosis not present

## 2018-12-13 DIAGNOSIS — Z7901 Long term (current) use of anticoagulants: Secondary | ICD-10-CM | POA: Diagnosis not present

## 2018-12-13 DIAGNOSIS — E785 Hyperlipidemia, unspecified: Secondary | ICD-10-CM | POA: Diagnosis not present

## 2018-12-13 LAB — BASIC METABOLIC PANEL
Anion gap: 3 — ABNORMAL LOW (ref 5–15)
BUN: 28 mg/dL — ABNORMAL HIGH (ref 8–23)
CO2: 24 mmol/L (ref 22–32)
Calcium: 8.9 mg/dL (ref 8.9–10.3)
Chloride: 109 mmol/L (ref 98–111)
Creatinine, Ser: 1.2 mg/dL (ref 0.61–1.24)
GFR calc Af Amer: >60 mL/min (ref >60)
GFR calc non Af Amer: 54 mL/min — ABNORMAL LOW (ref >60)
Glucose, Bld: 130 mg/dL — ABNORMAL HIGH (ref 70–99)
Potassium: 4.1 mmol/L (ref 3.5–5.1)
Sodium: 136 mmol/L (ref 135–145)

## 2018-12-13 LAB — LIPID PANEL
Cholesterol: 100 mg/dL (ref 0–200)
HDL: 39 mg/dL — ABNORMAL LOW (ref >40)
LDL Cholesterol: 49 mg/dL (ref 0–99)
Total CHOL/HDL Ratio: 2.6 RATIO
Triglycerides: 60 mg/dL (ref <150)
VLDL: 12 mg/dL (ref 0–40)

## 2018-12-13 LAB — MAGNESIUM: Magnesium: 2.1 mg/dL (ref 1.7–2.4)

## 2018-12-13 LAB — CBC
HCT: 40.7 % (ref 39.0–52.0)
Hemoglobin: 12.7 g/dL — ABNORMAL LOW (ref 13.0–17.0)
MCH: 29.7 pg (ref 26.0–34.0)
MCHC: 31.2 g/dL (ref 30.0–36.0)
MCV: 95.3 fL (ref 80.0–100.0)
Platelets: 218 10*3/uL (ref 150–400)
RBC: 4.27 MIL/uL (ref 4.22–5.81)
RDW: 13.2 % (ref 11.5–15.5)
WBC: 8.4 10*3/uL (ref 4.0–10.5)
nRBC: 0 % (ref 0.0–0.2)

## 2018-12-13 MED ORDER — POTASSIUM CHLORIDE CRYS ER 10 MEQ PO TBCR: 10.0000 meq | EXTENDED_RELEASE_TABLET | Freq: Every day | ORAL | 6 refills | Status: DC

## 2018-12-13 MED ORDER — FUROSEMIDE 20 MG PO TABS: 20.0000 mg | ORAL_TABLET | Freq: Every day | ORAL | 3 refills | Status: DC

## 2018-12-13 NOTE — Patient Instructions (Addendum)
START Lasix 20mg  (1 tab) daily  START Potassium 59meq (1 tab) daily  EKG done today in office  Labs today   We will only contact you if something comes back abnormal or we need to make some changes. Otherwise no news is good news!  REPEAT LABS on 12/22/18 at [facility]  Your physician recommends that you schedule a follow-up appointment on February 5th, 2020 at [facility]  At the [facility], you and your health needs are our priority. As part of our continuing mission to provide you with exceptional heart care, we have created designated Provider Care Teams. These Care Teams include your primary Cardiologist (physician) and Advanced Practice Providers (APPs- Physician Assistants and Nurse Practitioners) who all work together to provide you with the care you need, when you need it.   You may see any of the following providers on your designated Care Team at your next follow up: Marland Kitchen Dr Glori Bickers . Dr Loralie Champagne . Darrick Grinder, NP . Lyda Jester, PA   Please be sure to bring in all your medications bottles to every appointment.

## 2018-12-14 NOTE — Progress Notes (Signed)
Patient ID: [name] Spence, MD, male   DOB: 07/18/1929, 85 y.o.   MRN: [id number] PCP: Dr. Sherren Mocha Cardiology: Dr. 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  He had an RV infarct with chronic total occlusion of a small nondominant RCA in the 1980s.  He had a drug-eluting stent to the CFX in 11/10.    He is very symptomatic with atrial fibrillation episodes. He has had a chronic pattern of mild shortness of breath and mild left shoulder pain/chest aching when walking up a hill (x 20 years).   He can walk 3 miles on flat ground without problems.  He golfs 3-4 times a week.  He walks for 20-30 minutes on a treadmill several times a week.   Echo in 7/16 showed normal LV systolic function, EF 123456 with moderate diastolic dysfunction.  Moderate TR with PA systolic pressure 65 mmHg.  Normal RV size and systolic function.  Given volume overload on exam and abnormal echo, he was started on low dose Lasix in the past.    He was started on Multaq but continued to have episodes of atrial fibrillation.  This was stopped, and he was admitted for Tikosyn loading.  After he went home, he developed left facial droop and left arm weakness.  He was found to have a small right parietal cortical infarct with hemorrhagic conversion.  His symptoms resolved quickly. INR was subtherapeutic at admission.  Warfarin was stopped and he was put on apixaban.   In 9/17, he was admitted with LLL PNA.  During the admission, he had a couple of relatively short runs of atrial fibrillation.  He developed more frequent episodes of atrial fibrillation at home and was started on Tikosyn.   In 5/19, he had bilateral orchiectomy for metastatic prostate cancer.  His PSA has come down considerably.   In 1/20, he had influenza that progressed to PNA.  He was hospitalized with transient atrial fibrillation.    He is in sinus rhythm today.   No palpitations in the last few weeks.  He thinks he has had 4 episodes of atrial fibrillation since 1/20, none lasting longer than 5 hrs.   Weight is up 4 lbs.  He is short of breath walking longer distances but denies chest pain with exertion.  He is playing 18 holes of golf 3 times/week.  He was getting lightheaded with standing, but this resolved after he stopped Flomax.   ECG (personally reviewed): NSR, QTc 447 msec   Labs (6/11): LDL 63, HDL 35, K 5.5, creatinine 1.1 Labs (6/12): LDL 58, HDL 53, TSH normal, K 4.7, creatinine 1 Labs (12/12): BNP 79 Labs (7/14): K 5, creatinine 1.1, LDL 48, HDL 34 Labs (8/15): LDL 48, HDL 33, TSH normal, K 5.4, creatinine 1.2 Labs (6/16): K 5.1, creatinine 1.27, HCT 42.5 Labs (10/16): LDL 37, HDL 32 Labs (11/16): K 4.4, creatinine 1.36, BNP 103 Labs  (2/17): K 4.5, creatinine 1.38, Mg 2.0, LDL 32 Labs (3/17): HCT 41.7 Labs (5/17): K 4.2, creatinine 1.17, Mg 2.1 Labs (9/17): K 4.2, creatinine 1.16, HCT 37.3 Labs (11/17): hgb 13.3, LDL 40 Labs (5/18): K 4.5, creatinine 1.25 Labs (5/19): K 5.5, creatinine 1.16 Labs (2/20): K 4, creatinine 1.24, hgb 12.2  PMH: 1. CAD: RV infarct in the 1980s.  Chronic total occlusion of a small nondominant RCA.  11/10 PCI to CFX with Xience DES.  EF 60%  on LV-gram at that time. ETT (11/14) with 9'07" exercise, no significant ST depression.  2.  Paroxysmal atrial fibrillation: Multaq was ineffective. Now on Tikosyn.  3.  Hyperlipidemia 4. GERD 5. HTN 6. Asthma 7. Chronic diastolic CHF: Echo ([id number]): EF 55-60%, mild LV hypertrophy, no regional WMAs, mild AI, mild MR, mild-moderate biatrial enlargement, PA systolic pressure 51 mmHg. Echo (7/16) with EF 60-65%, moderate diastolic dysfunction, normal RV size and systolic function, mild-moderate MR, moderate TR, moderate biatrial enlargement, PA systolic pressure 65 mmHg.  - Echo (5/18): EF 55-60%, mild AI, mild MR, PASP 60 mmHg, normal RV size and systolic function.  - Echo  (8/19): EF 60-65%, normal RV size and systolic function, severe TR with PASP 60 mmHg.  8. CKD 9. CVA: Right parietal cortical CVA with peri-infarct hemorrhagic conversion.  CTA showed right M2 branch flow gap.   10. Prostate cancer: Metastatic.  - Bilateral orchiectomy in 5/19.   SH: Nonsmoker.  Retired Stage manager, lives in [place].  2 daughters.  Originally from [place].   FH: Brother with CABG.   ROS: All systems reviewed and negative except as per HPI.    Current Outpatient Medications  Medication Sig Dispense Refill  . apixaban (ELIQUIS) 5 MG TABS tablet Take 1 tablet (5 mg total) by mouth 2 (two) times daily. Needs apt for further refills 180 tablet 0  . atenolol (TENORMIN) 25 MG tablet TAKE 1 TABLET BY MOUTH DAILY. 90 tablet 3  . atorvastatin (LIPITOR) 20 MG tablet TAKE 1 TABLET(20 MG) BY MOUTH DAILY 90 tablet 0  . Azelastine HCl 0.15 % SOLN U 1 SPR IEN ONCE D IN THE EVE    . buPROPion (WELLBUTRIN SR) 150 MG 12 hr tablet Take 1 tablet (150 mg total) by mouth daily. 100 tablet 4  . cetirizine (ZYRTEC) 10 MG tablet Take 10 mg by mouth daily with supper.     . Cholecalciferol (VITAMIN D) 2000 units tablet Take 2,000 Units by mouth daily.    Marland Kitchen diltiazem (CARDIZEM) 30 MG tablet Take 30 mg by mouth as needed.    . dofetilide (TIKOSYN) 250 MCG capsule Take 1 capsule (250 mcg total) by mouth 2 (two) times daily. Needs appt for further refills 180 capsule 0  . fluticasone (FLONASE) 50 MCG/ACT nasal spray Place 2 sprays into both nostrils daily. 48 g 3  . Fluticasone Furoate (ARNUITY ELLIPTA) 200 MCG/ACT AEPB Inhale 1 puff into the lungs daily. 2 each 0  . LORazepam (ATIVAN) 1 MG tablet TAKE 1/2 TO 1 TABLET BY MOUTH AT BEDTIME AS NEEDED 90 tablet 1  . mometasone (NASONEX) 50 MCG/ACT nasal spray Place 1 spray into the nose daily as needed (for allergies).     . nitroGLYCERIN (NITROSTAT) 0.4 MG SL tablet Place 1 tablet (0.4 mg total) under the tongue every 5 (five) minutes x 3 doses as  needed for chest pain. 25 tablet 2  . omeprazole (PRILOSEC OTC) 20 MG tablet Take 1 tablet (20 mg total) by mouth daily. 100 tablet 4  . Propylene Glycol (SYSTANE BALANCE) 0.6 % SOLN Apply 1 drop to eye as needed (dry eyes).    . traMADol (ULTRAM) 50 MG tablet Take 1 tablet (50 mg total) by mouth every 12 (twelve) hours as needed. 120 tablet 5  . furosemide (LASIX) 20 MG tablet Take 1 tablet (20 mg total) by mouth daily. 90 tablet 3  . potassium chloride (KLOR-CON M10) 10 MEQ tablet Take 1 tablet (10 mEq total) by mouth daily. 30 tablet 6  No current facility-administered medications for this encounter.     BP 120/80   Pulse 62   Wt 72.8 kg (160 lb 9.6 oz)   SpO2 97%   BMI 23.38 kg/m  General: NAD Neck: No JVD, no thyromegaly or thyroid nodule.  Lungs: Clear to auscultation bilaterally with normal respiratory effort. CV: Nondisplaced PMI.  Heart regular S1/S2, no S3/S4, 2/6 HSM LLSB.  Trace edema.  No carotid bruit.  Normal pedal pulses.  Abdomen: Soft, nontender, no hepatosplenomegaly, no distention.  Skin: Intact without lesions or rashes.  Neurologic: Alert and oriented x 3.  Psych: Normal affect. Extremities: No clubbing or cyanosis.  HEENT: Normal.   Assessment/Plan: 1. CAD: No chest pain.    - He is on apixaban without ASA given stable CAD.   - Continue statin, beta blocker.  2. Hyperlipidemia: Check lipids today.  3. Chronic diastolic CHF:  Most recent echo in 8/19 showed EF 123456 but PA systolic pressure was estimated at 60 mmHg and there was severe TR (progressed from moderate), the RV looked normal.  I suspect that he has a degree of RV failure accounting for NYHA class II symptoms though he is not particularly volume overloaded on exam.   - Start Lasix 20 mg daily with KCl 10 mEq daily.  BMET today and again in 10 days.  - He wants to hold off on repeat echo.  Given age, think this is reasonable as unlikely to change management much.   4. Atrial fibrillation:  Paroxysmal.  Episodes are quite symptomatic, he feels "bad" with atrial fibrillation even when he's rate-controlled. He had frequent breakthrough on Multaq but seems to be doing much better on Tikosyn, no recent breakthrough symptoms. - Continue Tikosyn.  BMET/Mg today.  QTc ok on today's ECG.  - If has breakthrough afib on Tikosyn, amiodarone would be an option.  - Now on apixaban with CVA on warfarin (was subtherapeutic).  CBC today.  5. HTN: BP controlled.    Followup in 4 months.     Loralie Champagne 12/14/2018

## 2018-12-22 ENCOUNTER — Telehealth (HOSPITAL_COMMUNITY): Payer: Self-pay

## 2018-12-22 ENCOUNTER — Other Ambulatory Visit: Payer: Self-pay

## 2018-12-22 ENCOUNTER — Ambulatory Visit (HOSPITAL_COMMUNITY)
Admission: RE | Admit: 2018-12-22 | Discharge: 2018-12-22 | Disposition: A | Discharge status: Home / Self Care | Payer: PPO | Source: Ambulatory Visit | Attending: Cardiology | Admitting: Cardiology

## 2018-12-22 DIAGNOSIS — I5032 Chronic diastolic (congestive) heart failure: Secondary | ICD-10-CM

## 2018-12-22 LAB — BASIC METABOLIC PANEL
Anion gap: 9 (ref 5–15)
BUN: 26 mg/dL — ABNORMAL HIGH (ref 8–23)
CO2: 27 mmol/L (ref 22–32)
Calcium: 9.2 mg/dL (ref 8.9–10.3)
Chloride: 101 mmol/L (ref 98–111)
Creatinine, Ser: 1.48 mg/dL — ABNORMAL HIGH (ref 0.61–1.24)
GFR calc Af Amer: 49 mL/min — ABNORMAL LOW (ref >60)
GFR calc non Af Amer: 42 mL/min — ABNORMAL LOW (ref >60)
Glucose, Bld: 112 mg/dL — ABNORMAL HIGH (ref 70–99)
Potassium: 4.8 mmol/L (ref 3.5–5.1)
Sodium: 137 mmol/L (ref 135–145)

## 2018-12-22 NOTE — Telephone Encounter (Signed)
-----   Message from Larey Dresser, MD sent at 12/22/2018  2:10 PM EST ----- Creatinine a little higher.  No changes for now but would repeat BMET in 1 week to make sure stable.

## 2018-12-25 ENCOUNTER — Other Ambulatory Visit: Payer: Self-pay

## 2018-12-25 DIAGNOSIS — Z20822 Contact with and (suspected) exposure to covid-19: Secondary | ICD-10-CM

## 2018-12-25 DIAGNOSIS — Z20828 Contact with and (suspected) exposure to other viral communicable diseases: Secondary | ICD-10-CM

## 2018-12-26 ENCOUNTER — Inpatient Hospital Stay (HOSPITAL_COMMUNITY)
Admission: EM | Admit: 2018-12-26 | Discharge: 2018-12-31 | DRG: 871 | Disposition: A | Discharge status: Home Health | Payer: PPO | Attending: Internal Medicine | Admitting: Internal Medicine

## 2018-12-26 ENCOUNTER — Other Ambulatory Visit: Payer: Self-pay

## 2018-12-26 ENCOUNTER — Telehealth: Payer: PPO | Admitting: Family Medicine

## 2018-12-26 ENCOUNTER — Telehealth: Payer: Self-pay | Admitting: Family Medicine

## 2018-12-26 ENCOUNTER — Emergency Department (HOSPITAL_COMMUNITY): Payer: PPO

## 2018-12-26 DIAGNOSIS — C799 Secondary malignant neoplasm of unspecified site: Secondary | ICD-10-CM | POA: Diagnosis present

## 2018-12-26 DIAGNOSIS — S3993XA Unspecified injury of pelvis, initial encounter: Secondary | ICD-10-CM | POA: Diagnosis not present

## 2018-12-26 DIAGNOSIS — N179 Acute kidney failure, unspecified: Secondary | ICD-10-CM | POA: Diagnosis present

## 2018-12-26 DIAGNOSIS — I5033 Acute on chronic diastolic (congestive) heart failure: Secondary | ICD-10-CM | POA: Diagnosis not present

## 2018-12-26 DIAGNOSIS — I10 Essential (primary) hypertension: Secondary | ICD-10-CM | POA: Diagnosis present

## 2018-12-26 DIAGNOSIS — R9389 Abnormal findings on diagnostic imaging of other specified body structures: Secondary | ICD-10-CM | POA: Diagnosis present

## 2018-12-26 DIAGNOSIS — M25551 Pain in right hip: Secondary | ICD-10-CM | POA: Diagnosis not present

## 2018-12-26 DIAGNOSIS — E860 Dehydration: Secondary | ICD-10-CM | POA: Diagnosis present

## 2018-12-26 DIAGNOSIS — I5032 Chronic diastolic (congestive) heart failure: Secondary | ICD-10-CM

## 2018-12-26 DIAGNOSIS — J4530 Mild persistent asthma, uncomplicated: Secondary | ICD-10-CM | POA: Diagnosis present

## 2018-12-26 DIAGNOSIS — J90 Pleural effusion, not elsewhere classified: Secondary | ICD-10-CM | POA: Diagnosis not present

## 2018-12-26 DIAGNOSIS — Z8673 Personal history of transient ischemic attack (TIA), and cerebral infarction without residual deficits: Secondary | ICD-10-CM

## 2018-12-26 DIAGNOSIS — S199XXA Unspecified injury of neck, initial encounter: Secondary | ICD-10-CM | POA: Diagnosis not present

## 2018-12-26 DIAGNOSIS — I951 Orthostatic hypotension: Secondary | ICD-10-CM | POA: Diagnosis not present

## 2018-12-26 DIAGNOSIS — Z825 Family history of asthma and other chronic lower respiratory diseases: Secondary | ICD-10-CM

## 2018-12-26 DIAGNOSIS — N400 Enlarged prostate without lower urinary tract symptoms: Secondary | ICD-10-CM | POA: Diagnosis present

## 2018-12-26 DIAGNOSIS — R6521 Severe sepsis with septic shock: Secondary | ICD-10-CM | POA: Diagnosis not present

## 2018-12-26 DIAGNOSIS — A4151 Sepsis due to Escherichia coli [E. coli]: Principal | ICD-10-CM | POA: Diagnosis present

## 2018-12-26 DIAGNOSIS — E872 Acidosis, unspecified: Secondary | ICD-10-CM | POA: Diagnosis present

## 2018-12-26 DIAGNOSIS — R6520 Severe sepsis without septic shock: Secondary | ICD-10-CM | POA: Diagnosis not present

## 2018-12-26 DIAGNOSIS — Z20828 Contact with and (suspected) exposure to other viral communicable diseases: Secondary | ICD-10-CM | POA: Diagnosis not present

## 2018-12-26 DIAGNOSIS — F411 Generalized anxiety disorder: Secondary | ICD-10-CM | POA: Diagnosis not present

## 2018-12-26 DIAGNOSIS — K219 Gastro-esophageal reflux disease without esophagitis: Secondary | ICD-10-CM

## 2018-12-26 DIAGNOSIS — Z886 Allergy status to analgesic agent status: Secondary | ICD-10-CM

## 2018-12-26 DIAGNOSIS — N189 Chronic kidney disease, unspecified: Secondary | ICD-10-CM | POA: Diagnosis not present

## 2018-12-26 DIAGNOSIS — Z9861 Coronary angioplasty status: Secondary | ICD-10-CM

## 2018-12-26 DIAGNOSIS — M159 Polyosteoarthritis, unspecified: Secondary | ICD-10-CM | POA: Diagnosis present

## 2018-12-26 DIAGNOSIS — I2510 Atherosclerotic heart disease of native coronary artery without angina pectoris: Secondary | ICD-10-CM

## 2018-12-26 DIAGNOSIS — I071 Rheumatic tricuspid insufficiency: Secondary | ICD-10-CM | POA: Diagnosis present

## 2018-12-26 DIAGNOSIS — I480 Paroxysmal atrial fibrillation: Secondary | ICD-10-CM | POA: Diagnosis not present

## 2018-12-26 DIAGNOSIS — I959 Hypotension, unspecified: Secondary | ICD-10-CM | POA: Diagnosis not present

## 2018-12-26 DIAGNOSIS — E7800 Pure hypercholesterolemia, unspecified: Secondary | ICD-10-CM

## 2018-12-26 DIAGNOSIS — I509 Heart failure, unspecified: Secondary | ICD-10-CM

## 2018-12-26 DIAGNOSIS — R7881 Bacteremia: Secondary | ICD-10-CM | POA: Diagnosis present

## 2018-12-26 DIAGNOSIS — Z7901 Long term (current) use of anticoagulants: Secondary | ICD-10-CM

## 2018-12-26 DIAGNOSIS — R55 Syncope and collapse: Secondary | ICD-10-CM | POA: Diagnosis not present

## 2018-12-26 DIAGNOSIS — I252 Old myocardial infarction: Secondary | ICD-10-CM

## 2018-12-26 DIAGNOSIS — Z8249 Family history of ischemic heart disease and other diseases of the circulatory system: Secondary | ICD-10-CM

## 2018-12-26 DIAGNOSIS — K5790 Diverticulosis of intestine, part unspecified, without perforation or abscess without bleeding: Secondary | ICD-10-CM | POA: Diagnosis present

## 2018-12-26 DIAGNOSIS — Z8709 Personal history of other diseases of the respiratory system: Secondary | ICD-10-CM

## 2018-12-26 DIAGNOSIS — S0990XA Unspecified injury of head, initial encounter: Secondary | ICD-10-CM | POA: Diagnosis not present

## 2018-12-26 DIAGNOSIS — R000 Tachycardia, unspecified: Secondary | ICD-10-CM | POA: Diagnosis not present

## 2018-12-26 DIAGNOSIS — I130 Hypertensive heart and chronic kidney disease with heart failure and stage 1 through stage 4 chronic kidney disease, or unspecified chronic kidney disease: Secondary | ICD-10-CM | POA: Diagnosis present

## 2018-12-26 DIAGNOSIS — R109 Unspecified abdominal pain: Secondary | ICD-10-CM | POA: Diagnosis not present

## 2018-12-26 DIAGNOSIS — E785 Hyperlipidemia, unspecified: Secondary | ICD-10-CM | POA: Diagnosis not present

## 2018-12-26 DIAGNOSIS — Z8701 Personal history of pneumonia (recurrent): Secondary | ICD-10-CM

## 2018-12-26 DIAGNOSIS — D696 Thrombocytopenia, unspecified: Secondary | ICD-10-CM | POA: Diagnosis not present

## 2018-12-26 DIAGNOSIS — Z9181 History of falling: Secondary | ICD-10-CM

## 2018-12-26 DIAGNOSIS — E876 Hypokalemia: Secondary | ICD-10-CM | POA: Diagnosis present

## 2018-12-26 DIAGNOSIS — N12 Tubulo-interstitial nephritis, not specified as acute or chronic: Secondary | ICD-10-CM | POA: Diagnosis present

## 2018-12-26 DIAGNOSIS — N182 Chronic kidney disease, stage 2 (mild): Secondary | ICD-10-CM | POA: Diagnosis present

## 2018-12-26 DIAGNOSIS — Z8546 Personal history of malignant neoplasm of prostate: Secondary | ICD-10-CM

## 2018-12-26 DIAGNOSIS — A419 Sepsis, unspecified organism: Secondary | ICD-10-CM | POA: Diagnosis present

## 2018-12-26 DIAGNOSIS — Z881 Allergy status to other antibiotic agents status: Secondary | ICD-10-CM

## 2018-12-26 DIAGNOSIS — S79911A Unspecified injury of right hip, initial encounter: Secondary | ICD-10-CM | POA: Diagnosis not present

## 2018-12-26 DIAGNOSIS — Z79899 Other long term (current) drug therapy: Secondary | ICD-10-CM

## 2018-12-26 DIAGNOSIS — N390 Urinary tract infection, site not specified: Secondary | ICD-10-CM | POA: Diagnosis present

## 2018-12-26 DIAGNOSIS — Z955 Presence of coronary angioplasty implant and graft: Secondary | ICD-10-CM

## 2018-12-26 DIAGNOSIS — I5030 Unspecified diastolic (congestive) heart failure: Secondary | ICD-10-CM | POA: Diagnosis present

## 2018-12-26 DIAGNOSIS — R269 Unspecified abnormalities of gait and mobility: Secondary | ICD-10-CM

## 2018-12-26 DIAGNOSIS — R531 Weakness: Secondary | ICD-10-CM | POA: Diagnosis not present

## 2018-12-26 LAB — URINALYSIS, ROUTINE W REFLEX MICROSCOPIC
Bilirubin Urine: NEGATIVE
Glucose, UA: NEGATIVE mg/dL
Ketones, ur: NEGATIVE mg/dL
Nitrite: NEGATIVE
Protein, ur: 100 mg/dL — AB
Specific Gravity, Urine: 1.016 (ref 1.005–1.030)
WBC, UA: >50 WBC/hpf — ABNORMAL HIGH (ref 0–5)
pH: 5 (ref 5.0–8.0)

## 2018-12-26 LAB — APTT: aPTT: 34 seconds (ref 24–36)

## 2018-12-26 LAB — COMPREHENSIVE METABOLIC PANEL
ALT: 23 U/L (ref 0–44)
AST: 43 U/L — ABNORMAL HIGH (ref 15–41)
Albumin: 2.6 g/dL — ABNORMAL LOW (ref 3.5–5.0)
Alkaline Phosphatase: 68 U/L (ref 38–126)
Anion gap: 12 (ref 5–15)
BUN: 67 mg/dL — ABNORMAL HIGH (ref 8–23)
CO2: 19 mmol/L — ABNORMAL LOW (ref 22–32)
Calcium: 7.8 mg/dL — ABNORMAL LOW (ref 8.9–10.3)
Chloride: 96 mmol/L — ABNORMAL LOW (ref 98–111)
Creatinine, Ser: 2.7 mg/dL — ABNORMAL HIGH (ref 0.61–1.24)
GFR calc Af Amer: 23 mL/min — ABNORMAL LOW (ref >60)
GFR calc non Af Amer: 20 mL/min — ABNORMAL LOW (ref >60)
Glucose, Bld: 131 mg/dL — ABNORMAL HIGH (ref 70–99)
Potassium: 4.7 mmol/L (ref 3.5–5.1)
Sodium: 127 mmol/L — ABNORMAL LOW (ref 135–145)
Total Bilirubin: 1.4 mg/dL — ABNORMAL HIGH (ref 0.3–1.2)
Total Protein: 5.1 g/dL — ABNORMAL LOW (ref 6.5–8.1)

## 2018-12-26 LAB — CBC WITH DIFFERENTIAL/PLATELET
Abs Immature Granulocytes: 1.46 10*3/uL — ABNORMAL HIGH (ref 0.00–0.07)
Basophils Absolute: 0 10*3/uL (ref 0.0–0.1)
Basophils Relative: 0 %
Eosinophils Absolute: 0.3 10*3/uL (ref 0.0–0.5)
Eosinophils Relative: 2 %
HCT: 34 % — ABNORMAL LOW (ref 39.0–52.0)
Hemoglobin: 11.3 g/dL — ABNORMAL LOW (ref 13.0–17.0)
Immature Granulocytes: 9 %
Lymphocytes Relative: 2 %
Lymphs Abs: 0.3 10*3/uL — ABNORMAL LOW (ref 0.7–4.0)
MCH: 30.5 pg (ref 26.0–34.0)
MCHC: 33.2 g/dL (ref 30.0–36.0)
MCV: 91.6 fL (ref 80.0–100.0)
Monocytes Absolute: 0.7 10*3/uL (ref 0.1–1.0)
Monocytes Relative: 4 %
Neutro Abs: 14.2 10*3/uL — ABNORMAL HIGH (ref 1.7–7.7)
Neutrophils Relative %: 83 %
Platelets: 72 10*3/uL — ABNORMAL LOW (ref 150–400)
RBC: 3.71 MIL/uL — ABNORMAL LOW (ref 4.22–5.81)
RDW: 13.7 % (ref 11.5–15.5)
WBC Morphology: INCREASED
WBC: 17 10*3/uL — ABNORMAL HIGH (ref 4.0–10.5)
nRBC: 0 % (ref 0.0–0.2)

## 2018-12-26 LAB — LACTIC ACID, PLASMA
Lactic Acid, Venous: 3.1 mmol/L (ref 0.5–1.9)
Lactic Acid, Venous: 1.8 mmol/L (ref 0.5–1.9)
Lactic Acid, Venous: 2.2 mmol/L (ref 0.5–1.9)

## 2018-12-26 LAB — PROTIME-INR
INR: 2.9 — ABNORMAL HIGH (ref 0.8–1.2)
Prothrombin Time: 30.6 seconds — ABNORMAL HIGH (ref 11.4–15.2)

## 2018-12-26 LAB — SARS CORONAVIRUS 2 (TAT 6-24 HRS): SARS Coronavirus 2: NEGATIVE

## 2018-12-26 LAB — MAGNESIUM: Magnesium: 1.7 mg/dL (ref 1.7–2.4)

## 2018-12-26 LAB — BRAIN NATRIURETIC PEPTIDE: B Natriuretic Peptide: 334.3 pg/mL — ABNORMAL HIGH (ref 0.0–100.0)

## 2018-12-26 MED ORDER — SODIUM CHLORIDE 0.9 % IV SOLN: 250.0000 mL | INTRAVENOUS | Status: DC | PRN

## 2018-12-26 MED ORDER — VANCOMYCIN HCL IN DEXTROSE 1-5 GM/200ML-% IV SOLN: 1000.0000 mg | Freq: Once | INTRAVENOUS | Status: DC

## 2018-12-26 MED ORDER — SODIUM CHLORIDE 0.9 % IV SOLN
INTRAVENOUS | Status: DC
  Administered 2018-12-26 – 2018-12-28 (×3): via INTRAVENOUS

## 2018-12-26 MED ORDER — DILTIAZEM HCL 60 MG PO TABS
30.0000 mg | ORAL_TABLET | Freq: Four times a day (QID) | ORAL | Status: DC | PRN
  Administered 2018-12-26: 23:00:00 30 mg via ORAL
  Filled 2018-12-26: qty 1

## 2018-12-26 MED ORDER — VANCOMYCIN VARIABLE DOSE PER UNSTABLE RENAL FUNCTION (PHARMACIST DOSING): Status: DC

## 2018-12-26 MED ORDER — VANCOMYCIN HCL 10 G IV SOLR
1500.0000 mg | Freq: Once | INTRAVENOUS | Status: AC
  Administered 2018-12-26: 17:00:00 1500 mg via INTRAVENOUS
  Filled 2018-12-26: qty 1500

## 2018-12-26 MED ORDER — FLUTICASONE PROPIONATE 50 MCG/ACT NA SUSP
2.0000 | Freq: Every day | NASAL | Status: DC
  Administered 2018-12-27 – 2018-12-31 (×3): 2 via NASAL
  Filled 2018-12-26 (×2): qty 16

## 2018-12-26 MED ORDER — VITAMIN D 25 MCG (1000 UNIT) PO TABS
2000.0000 [IU] | ORAL_TABLET | Freq: Every day | ORAL | Status: DC
  Administered 2018-12-27 – 2018-12-31 (×5): 2000 [IU] via ORAL
  Filled 2018-12-26 (×5): qty 2

## 2018-12-26 MED ORDER — FLUTICASONE FUROATE 200 MCG/ACT IN AEPB: 1.0000 | INHALATION_SPRAY | Freq: Every day | RESPIRATORY_TRACT | Status: DC

## 2018-12-26 MED ORDER — SODIUM CHLORIDE 0.9% FLUSH
3.0000 mL | Freq: Two times a day (BID) | INTRAVENOUS | Status: DC
  Administered 2018-12-26: 22:00:00 3 mL via INTRAVENOUS
  Administered 2018-12-27: 10 mL via INTRAVENOUS
  Administered 2018-12-29 – 2018-12-30 (×2): 3 mL via INTRAVENOUS

## 2018-12-26 MED ORDER — SODIUM CHLORIDE 0.9 % IV BOLUS (SEPSIS)
250.0000 mL | Freq: Once | INTRAVENOUS | Status: AC
  Administered 2018-12-26: 250 mL via INTRAVENOUS

## 2018-12-26 MED ORDER — BUPROPION HCL ER (SR) 150 MG PO TB12
150.0000 mg | ORAL_TABLET | Freq: Every day | ORAL | Status: DC
  Administered 2018-12-27 – 2018-12-31 (×5): 150 mg via ORAL
  Filled 2018-12-26 (×6): qty 1

## 2018-12-26 MED ORDER — PANTOPRAZOLE SODIUM 40 MG PO TBEC
40.0000 mg | DELAYED_RELEASE_TABLET | Freq: Every day | ORAL | Status: DC
  Administered 2018-12-27 – 2018-12-31 (×5): 40 mg via ORAL
  Filled 2018-12-26 (×5): qty 1

## 2018-12-26 MED ORDER — SODIUM CHLORIDE 0.9% FLUSH: 3.0000 mL | INTRAVENOUS | Status: DC | PRN

## 2018-12-26 MED ORDER — SODIUM CHLORIDE 0.9 % IV BOLUS (SEPSIS)
1000.0000 mL | Freq: Once | INTRAVENOUS | Status: AC
  Administered 2018-12-26: 1000 mL via INTRAVENOUS

## 2018-12-26 MED ORDER — PROPYLENE GLYCOL 0.6 % OP SOLN: 1.0000 [drp] | OPHTHALMIC | Status: DC | PRN

## 2018-12-26 MED ORDER — TRAMADOL HCL 50 MG PO TABS: 50.0000 mg | ORAL_TABLET | Freq: Two times a day (BID) | ORAL | Status: DC | PRN

## 2018-12-26 MED ORDER — METRONIDAZOLE IN NACL 5-0.79 MG/ML-% IV SOLN
500.0000 mg | Freq: Once | INTRAVENOUS | Status: AC
  Administered 2018-12-26: 500 mg via INTRAVENOUS
  Filled 2018-12-26: qty 100

## 2018-12-26 MED ORDER — DOFETILIDE 125 MCG PO CAPS
125.0000 ug | ORAL_CAPSULE | Freq: Two times a day (BID) | ORAL | Status: DC
  Administered 2018-12-26 – 2018-12-30 (×9): 125 ug via ORAL
  Filled 2018-12-26 (×11): qty 1

## 2018-12-26 MED ORDER — LORAZEPAM 1 MG PO TABS: 0.5000 mg | ORAL_TABLET | Freq: Every evening | ORAL | Status: DC | PRN

## 2018-12-26 MED ORDER — SODIUM CHLORIDE 0.9 % IV BOLUS (SEPSIS)
1000.0000 mL | Freq: Once | INTRAVENOUS | Status: AC
  Administered 2018-12-26: 17:00:00 1000 mL via INTRAVENOUS

## 2018-12-26 MED ORDER — SODIUM CHLORIDE 0.9 % IV SOLN
2.0000 g | Freq: Once | INTRAVENOUS | Status: AC
  Administered 2018-12-26: 17:00:00 2 g via INTRAVENOUS
  Filled 2018-12-26: qty 2

## 2018-12-26 MED ORDER — POLYVINYL ALCOHOL 1.4 % OP SOLN
1.0000 [drp] | OPHTHALMIC | Status: DC | PRN
  Filled 2018-12-26: qty 15

## 2018-12-26 MED ORDER — SODIUM CHLORIDE 0.9 % IV SOLN: 2.0000 g | INTRAVENOUS | Status: DC

## 2018-12-26 MED ORDER — ATORVASTATIN CALCIUM 10 MG PO TABS
20.0000 mg | ORAL_TABLET | Freq: Every day | ORAL | Status: DC
  Administered 2018-12-27 – 2018-12-29 (×3): 20 mg via ORAL
  Filled 2018-12-26 (×3): qty 2

## 2018-12-26 MED ORDER — FLUTICASONE PROPIONATE HFA 220 MCG/ACT IN AERO
1.0000 | INHALATION_SPRAY | Freq: Two times a day (BID) | RESPIRATORY_TRACT | Status: DC
  Administered 2018-12-26 – 2018-12-31 (×10): 1 via RESPIRATORY_TRACT
  Filled 2018-12-26: qty 12

## 2018-12-26 MED ORDER — OMEPRAZOLE MAGNESIUM 20 MG PO TBEC: 20.0000 mg | DELAYED_RELEASE_TABLET | Freq: Every day | ORAL | Status: DC

## 2018-12-26 MED ORDER — APIXABAN 2.5 MG PO TABS
2.5000 mg | ORAL_TABLET | Freq: Two times a day (BID) | ORAL | Status: DC
  Administered 2018-12-26 – 2018-12-30 (×9): 2.5 mg via ORAL
  Filled 2018-12-26 (×11): qty 1

## 2018-12-26 NOTE — H&P (Signed)
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  Patient states that he had a possible Covid exposure about a week ago.  When he was playing golf with a friend.  The whole week he was tired and weak.  Had a Covid test yesterday with results pending.  Today he was weak and slid down from his chair to the floor.  He was not not able to get up.  He came to emergency room when he was found to be hypotensive in the 70s per EMS.  Patient denies any fever chills cough.  ED Course:   In the emergency room he was found hypotensive with sepsis criteria secondary to UTI, CT abdomen and pelvis show perinephritic stranding both kidney more on the right Lactic acid 3.1 Receive IV fluids per sepsis protocol and was started on cefepime and vancomycin.  Review of Systems: As per HPI otherwise 10 point review of systems negative.  Exam General weakness hypertension  Past Medical History:  Diagnosis Date  . Allergic rhinitis   . Anxiety   . Asthma   . Atrial fibrillation ([place])   . CAD (coronary artery disease)   . Depression   . Diverticulosis 03/16/1995  . DJD (degenerative joint disease)    "most joints" (03/11/2015)  . GERD (gastroesophageal reflux disease) 12/30/2000  . Heart attack ([place]) 1984  . Hiatal hernia 12/30/2000  . History of blood transfusion 1988   "related to GI bleeding OR"  . History of duodenal ulcer 10/15/1986  . Hyperlipidemia   . Hypertension   . Sinus bradycardia   . Stroke [facility])      Past Surgical History:  Procedure Laterality Date  . CARDIAC CATHETERIZATION    . CATARACT EXTRACTION W/ INTRAOCULAR LENS  IMPLANT, BILATERAL Bilateral ?2013  . CHOLECYSTECTOMY OPEN  1989  . CORONARY ANGIOPLASTY WITH STENT PLACEMENT  2010  . hospital ccu  1984   heart attack, hypoplastic right coronary  . INGUINAL HERNIA REPAIR Right ~ 1990  . NASAL SEPTUM SURGERY  1972  . ORCHIECTOMY Bilateral 06/10/2017   Procedure: BILATERAL ORCHIECTOMY;  Surgeon: Franchot Gallo, MD;  Location: [facility] ORS;  Service: Urology;  Laterality: Bilateral;  MAC ANESTHESIA AND LOCAL  . TONSILLECTOMY    . TOTAL KNEE ARTHROPLASTY Bilateral 1998-2004   "right-left"  . VAGOTOMY AND PYLOROPLASTY  1988   "bleeding duodenal ulcer"     reports that he has never smoked. He has never used smokeless tobacco. He reports that he does not drink alcohol or use drugs.  Allergies  Allergen Reactions  . Tylenol [Acetaminophen] Rash  . Azithromycin   . Advil [Ibuprofen] Nausea Only and Rash  . Asa [Aspirin] Rash    Family History  Problem Relation Age of Onset  . Coronary artery disease Mother        deceased  . Deep vein thrombosis Sister   . Coronary artery disease Brother   . Asthma Brother   . Colon cancer Neg Hx      Prior to Admission medications   Medication Sig Start Date End  Date Taking? Authorizing Provider  apixaban (ELIQUIS) 5 MG TABS tablet Take 1 tablet (5 mg total) by mouth 2 (two) times daily. Needs apt for further refills 11/01/18   Larey Dresser, MD  atenolol (TENORMIN) 25 MG tablet TAKE 1 TABLET BY MOUTH DAILY. 06/29/18   Sherran Needs, NP  atorvastatin (LIPITOR) 20 MG tablet TAKE 1 TABLET(20 MG) BY MOUTH DAILY 12/12/18   Burchette, Alinda Sierras, MD  Azelastine HCl 0.15 % SOLN U 1 SPR IEN ONCE D IN THE EVE 07/05/18   [provider]  buPROPion (WELLBUTRIN SR) 150 MG 12 hr tablet Take 1 tablet (150 mg total) by mouth daily. 11/21/17   [name], MD  cetirizine (ZYRTEC) 10 MG  tablet Take 10 mg by mouth daily with supper.     [provider]  Cholecalciferol (VITAMIN D) 2000 units tablet Take 2,000 Units by mouth daily.    [provider]  diltiazem (CARDIZEM) 30 MG tablet Take 30 mg by mouth as needed.    [provider]  dofetilide (TIKOSYN) 250 MCG capsule Take 1 capsule (250 mcg total) by mouth 2 (two) times daily. Needs appt for further refills 10/11/18   Larey Dresser, MD  fluticasone [facility]) 50 MCG/ACT nasal spray Place 2 sprays into both nostrils daily. 09/28/18   Brand Males, MD  Fluticasone Furoate (ARNUITY ELLIPTA) 200 MCG/ACT AEPB Inhale 1 puff into the lungs daily. 09/28/18   Brand Males, MD  furosemide (LASIX) 20 MG tablet Take 1 tablet (20 mg total) by mouth daily. 12/13/18 03/13/19  Larey Dresser, MD  LORazepam (ATIVAN) 1 MG tablet TAKE 1/2 TO 1 TABLET BY MOUTH AT BEDTIME AS NEEDED 10/11/18   Burchette, Alinda Sierras, MD  mometasone (NASONEX) 50 MCG/ACT nasal spray Place 1 spray into the nose daily as needed (for allergies).     [provider]  nitroGLYCERIN (NITROSTAT) 0.4 MG SL tablet Place 1 tablet (0.4 mg total) under the tongue every 5 (five) minutes x 3 doses as needed for chest pain. 01/04/17   [name], MD  omeprazole (PRILOSEC OTC) 20 MG tablet Take 1 tablet (20 mg total) by mouth daily. 09/17/13   [name], MD  potassium chloride (KLOR-CON M10) 10 MEQ tablet Take 1 tablet (10 mEq total) by mouth daily. 12/13/18   Larey Dresser, MD  Propylene Glycol (SYSTANE BALANCE) 0.6 % SOLN Apply 1 drop to eye as needed (dry eyes).    [provider]  traMADol (ULTRAM) 50 MG tablet Take 1 tablet (50 mg total) by mouth every 12 (twelve) hours as needed. 07/07/18   [name], MD    Physical Exam: Vitals:   12/26/18 1930 12/26/18 1945 12/26/18 2000 12/26/18 2015  BP: 99/69 96/64 (!) 131/116 (!) 89/76  Pulse: 94 92 90 86  Resp: 19 (!) 22 19 (!) 22  Temp:      TempSrc:      SpO2: 100%  100% 100% 100%  Weight:      Height:        Constitutional: NAD, calm, comfortable Vitals:   12/26/18 1930 12/26/18 1945 12/26/18 2000 12/26/18 2015  BP: 99/69 96/64 (!) 131/116 (!) 89/76  Pulse: 94 92 90 86  Resp: 19 (!) 22 19 (!) 22  Temp:      TempSrc:      SpO2: 100% 100% 100% 100%  Weight:      Height:       Eyes: PERRL, lids and conjunctivae  normal ENMT: Mucous membranes are moist. Posterior pharynx clear of any exudate or lesions.Normal dentition.  Neck: normal, supple, no masses, no thyromegaly Respiratory: clear to auscultation bilaterally, no wheezing, no crackles. Normal respiratory effort. No accessory muscle use.  Cardiovascular: Regular rate and rhythm, no murmurs / rubs / gallops. No extremity edema. 2+ pedal pulses. No carotid bruits.  Abdomen: no tenderness, no masses palpated. No hepatosplenomegaly. Bowel sounds positive.  Musculoskeletal: no clubbing / cyanosis. No joint deformity upper and lower extremities. Good ROM, no contractures. Normal muscle tone.  Skin: no rashes, lesions, ulcers. No induration Neurologic: CN 2-12 grossly intact. Sensation intact, DTR normal. Strength 5/5 in all 4.  Psychiatric: Normal judgment and insight. Alert and oriented x 3. Normal mood.    Labs on Admission: I have personally reviewed following labs and imaging studies  CBC: Recent Labs  Lab 12/26/18 1540  WBC 17.0*  NEUTROABS 14.2*  HGB 11.3*  HCT 34.0*  MCV 91.6  PLT 72*   Basic Metabolic Panel: Recent Labs  Lab 12/22/18 1048 12/26/18 1540  NA 137 127*  K 4.8 4.7  CL 101 96*  CO2 27 19*  GLUCOSE 112* 131*  BUN 26* 67*  CREATININE 1.48* 2.70*  CALCIUM 9.2 7.8*   GFR: Estimated Creatinine Clearance: 19.3 mL/min (A) (by C-G formula based on SCr of 2.7 mg/dL (H)). Liver Function Tests: Recent Labs  Lab 12/26/18 1540  AST 43*  ALT 23  ALKPHOS 68  BILITOT 1.4*  PROT 5.1*  ALBUMIN 2.6*   No results for input(s): LIPASE, AMYLASE in the last 168 hours.  No results for input(s): AMMONIA in the last 168 hours. Coagulation Profile: Recent Labs  Lab 12/26/18 1540  INR 2.9*   Cardiac Enzymes: No results for input(s): CKTOTAL, CKMB, CKMBINDEX, TROPONINI in the last 168 hours. BNP (last 3 results) No results for input(s): PROBNP in the last 8760 hours. HbA1C: No results for input(s): HGBA1C in the last 72 hours. CBG: No results for input(s): GLUCAP in the last 168 hours. Lipid Profile: No results for input(s): CHOL, HDL, LDLCALC, TRIG, CHOLHDL, LDLDIRECT in the last 72 hours. Thyroid Function Tests: No results for input(s): TSH, T4TOTAL, FREET4, T3FREE, THYROIDAB in the last 72 hours. Anemia Panel: No results for input(s): VITAMINB12, FOLATE, FERRITIN, TIBC, IRON, RETICCTPCT in the last 72 hours. Urine analysis:    Component Value Date/Time   COLORURINE AMBER (A) 12/26/2018 1640   APPEARANCEUR CLOUDY (A) 12/26/2018 1640   LABSPEC 1.016 12/26/2018 1640   PHURINE 5.0 12/26/2018 1640   GLUCOSEU NEGATIVE 12/26/2018 1640   HGBUR MODERATE (A) 12/26/2018 1640   HGBUR trace-lysed 07/18/2009 1022   BILIRUBINUR NEGATIVE 12/26/2018 1640   BILIRUBINUR N 01/04/2017 1524   KETONESUR NEGATIVE 12/26/2018 1640   PROTEINUR 100 (A) 12/26/2018 1640   UROBILINOGEN 0.2 01/04/2017 1524   UROBILINOGEN 0.2 07/18/2009 1022   NITRITE NEGATIVE 12/26/2018 1640   LEUKOCYTESUR LARGE (A) 12/26/2018 1640    Radiological Exams on Admission: Ct Head Wo Contrast  Result Date: 12/26/2018 CLINICAL DATA:  Head trauma syncopal episode EXAM: CT HEAD WITHOUT CONTRAST CT CERVICAL SPINE WITHOUT CONTRAST TECHNIQUE: Multidetector CT imaging of the head and cervical spine was performed following the standard protocol without intravenous contrast. Multiplanar CT image reconstructions of the cervical spine were also generated. COMPARISON:  CT brain and cervical spine 03/20/2018 FINDINGS: CT HEAD FINDINGS Brain: No acute territorial infarction, hemorrhage, or intracranial  mass is seen. Similar appearance of encephalomalacia/chronic infarcts within the right frontal and parietal lobes. Atrophy.  Mild small vessel ischemic changes of the white matter. Stable ventricle size Vascular: No hyperdense vessel. Vertebral and carotid vascular calcification Skull: No fracture.  Right frontal osteoma Sinuses/Orbits: Interval extensive opacification of the ethmoid and maxillary sinuses with fluid levels in the sphenoid sinuses. Other: None CT CERVICAL SPINE FINDINGS Alignment: Trace anterolisthesis C4 on C5 as before. Facet alignment normal Skull base and vertebrae: No acute fracture. No primary bone lesion or focal pathologic process. Soft tissues and spinal canal: No prevertebral fluid or swelling. No visible canal hematoma. Disc levels: Moderate degenerative changes C5-C6 and C6-C7. Multiple level facet degenerative change. Upper chest: Negative. Other: None IMPRESSION: 1. No CT evidence for acute intracranial abnormality. Atrophy and chronic infarct in the right frontal and parietal lobes. Mild small vessel ischemic changes of the white matter 2. No acute osseous abnormality of the cervical spine 3. Pansinusitis Electronically Signed   By: Donavan Foil M.D.   On: 12/26/2018 18:58   Dg Pelvis Portable  Result Date: 12/26/2018 CLINICAL DATA:  Recent fall EXAM: PORTABLE PELVIS 1-2 VIEWS COMPARISON:  May 18, 2017 FINDINGS: Small osseous fragment seen inferior to the right femoral head which could represent a small chip fracture. there is sclerotic slightly expansile osseous lesion again noted within the left inferior pubic rami, and ilium. IMPRESSION: 1. Small osseous fragment seen adjacent to the inferior right femoral head which could represent a small chip fracture. If further evaluation is required, would recommend cross-sectional imaging. 2. Sclerotic osseous lesion within the left pelvis as on the prior CT May 18, 2017. Electronically Signed   By: Prudencio Pair M.D.   On: 12/26/2018  16:11   Dg Chest Port 1 View  Result Date: 12/26/2018 CLINICAL DATA:  Weakness and hypertension EXAM: PORTABLE CHEST 1 VIEW COMPARISON:  February 25, 2018 FINDINGS: There is mild cardiomegaly. Aortic knob calcifications. Both lungs are clear. The visualized skeletal structures are unremarkable. Surgical clips seen at the GE junction. IMPRESSION: No acute cardiopulmonary process. Electronically Signed   By: Prudencio Pair M.D.   On: 12/26/2018 16:08   Dg Hip Unilat W Or Wo Pelvis 2-3 Views Right  Result Date: 12/26/2018 CLINICAL DATA:  Fall.  Pain. EXAM: DG HIP (WITH OR WITHOUT PELVIS) 2-3V RIGHT COMPARISON:  Today's CT, dictated separately. FINDINGS: No acute fracture or dislocation.  Sacroiliac joints are symmetric. Sclerosis involving the left inferior pubic ramus was present back on the CT of 05/18/2017, suspicious for metastatic disease. IMPRESSION: No acute osseous abnormality. Electronically Signed   By: Abigail Miyamoto M.D.   On: 12/26/2018 18:55    EKG: Independently reviewed.  Normal sinus no acute ST-T changes  Assessment/Plan Principal Problem:   Severe sepsis (HCC) Active Problems:   Hyperlipidemia   Essential hypertension   CAD S/P OM PCI 2010   Mild persistent asthma without complication   GERD   Paroxysmal atrial fibrillation (HCC)   History of asthma   Abnormality of gait   Acute lower UTI   Congestive heart failure with left ventricular diastolic dysfunction (HCC)   Sepsis ([place])   Acute kidney injury superimposed on CKD ([place])   Assessment and plan  Severe sepsis Patient chief complaint generalized weakness, exposed to Covid 19 Came with hypotension tachycardia tachypnea Lactic acid 3.1, with acute kidney injury Source urinary tract infection Plan IV fluids 30 cc/kg bolus then 100 cc/h, serial lactic acid, procalcitonin, blood cultures, urine cultures, started on cefepime and vancomycin  Urinary tract infection Sign of stranding the right and left kidney more  on  the right on CT scan History of prostate cancer, dysuria Plan urine cultures, cefepime  Acute kidney injury on chronic kidney disease stage II Secondary to dehydration, sepsis Plan IV fluids avoid nephrotoxins follow BUN and creatinine  Congestive heart failure diastolic dysfunction Hold Lasix  Paroxysmal atrial fibrillation Now in sinus Resume Tikosyn, Cardizem 30 mg every 6 as needed for heart rate more than 120, hold for blood pressure less than 100, Tenormin on hold Resume Eliquis  CAD Lipitor Eliquis   Hypercholesterolemia Lipitor  History of asthma Resume inhaler   Status post fall Patient basically sliding a chair CT C-spine pelvis chest negative for fracture  Prostate cancer stable Status post bilateral orchiectomy     DVT prophylaxis: eliquis  Code Status: full code Family Communication:  Disposition Plan: d/c home Consults called: no Admission status: Full admission   Shadiamond Koska G Kitiara Hintze MD [facility]  If 7PM-7AM, please contact night-coverage [email or web address]   12/26/2018, 9:23 PM

## 2018-12-26 NOTE — ED Provider Notes (Addendum)
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 Patient states that he had a possible COVID exposure about a week ago.  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  He apparently had a COVID test done yesterday with results pending.  He states that he has been tired and weak.  Today he states that he felt so weak and slid down from his chair to the floor.  He states that he was unable to get up afterwards.  He was noted to be hypotensive in the 70s per EMS.  Patient denies any fevers at home.  He denies any head injury.     The history is provided by the patient.    Past Medical History:  Diagnosis Date   Allergic rhinitis    Anxiety    Asthma    Atrial fibrillation ([place])    CAD (coronary artery disease)    Depression    Diverticulosis 03/16/1995   DJD (degenerative joint disease)    "most joints" (03/11/2015)   GERD (gastroesophageal reflux disease) 12/30/2000   Heart attack ([place]) 1984   Hiatal hernia 12/30/2000   History of blood transfusion 1988   "related to GI bleeding OR"   History of duodenal ulcer 10/15/1986   Hyperlipidemia    Hypertension    Sinus bradycardia    Stroke [facility])     Patient Active Problem List   Diagnosis Date Noted   Lobar pneumonia (HCC)-left lower lobs  02/26/2018   Influenza B 02/26/2018   Sepsis ([place]) 02/25/2018   BPH associated with nocturia 11/21/2017   Arthritis of carpometacarpal (CMC) joint of right thumb 07/06/2016   Right foot pain 02/11/2016   Abnormality of gait 11/27/2015   Pain in joint, ankle and foot 11/27/2015   History of asthma 10/23/2015   Chronic diastolic heart failure ([place])     Acute hemorrhagic infarction of brain ([place])    Hypersensitivity reaction 04/01/2015   Persistent atrial fibrillation 04/01/2015   ICH (intracerebral hemorrhage) ([place])    TIA (transient ischemic attack) 03/31/2015   Visit for monitoring Tikosyn therapy 03/11/2015   Paroxysmal atrial fibrillation ([place])    Pulmonary hypertension ([place]) 09/26/2014   Bradycardia 93/81/8299   Diastolic dysfunction 37/16/9678   CAD S/P OM PCI 2010 12/16/2008   Hyperlipidemia 06/20/2007   Anxiety state 06/20/2007   Essential hypertension 06/20/2007   Allergic rhinitis 06/20/2007   Mild persistent asthma without complication 93/81/0175   GERD 06/20/2007    Past Surgical History:  Procedure Laterality Date   CARDIAC CATHETERIZATION     CATARACT EXTRACTION W/ INTRAOCULAR LENS  IMPLANT, BILATERAL Bilateral ?2013   [place] WITH STENT PLACEMENT  2010   hospital ccu  1984   heart attack, hypoplastic right coronary   INGUINAL HERNIA REPAIR Right ~ [place] Bilateral 06/10/2017   Procedure: BILATERAL ORCHIECTOMY;  Surgeon: Franchot Gallo, MD;  Location: [facility] ORS;  Service: Urology;  Laterality: Bilateral;  MAC ANESTHESIA AND LOCAL   TONSILLECTOMY     TOTAL KNEE ARTHROPLASTY Bilateral 1998-2004   "right-left"   VAGOTOMY AND PYLOROPLASTY  1988   "bleeding duodenal ulcer"        Home Medications    Prior to Admission medications   Medication Sig Start Date End Date Taking? Authorizing Provider  apixaban (ELIQUIS) 5 MG TABS tablet Take 1 tablet (5 mg total) by mouth 2 (two) times daily. Needs apt for further refills 11/01/18   Larey Dresser, MD  atenolol (TENORMIN) 25 MG tablet TAKE 1 TABLET BY MOUTH DAILY. 06/29/18   Sherran Needs, NP  atorvastatin (LIPITOR) 20 MG tablet TAKE 1 TABLET(20 MG) BY MOUTH DAILY 12/12/18   Burchette, Alinda Sierras, MD  Azelastine HCl 0.15 % SOLN U 1 SPR IEN ONCE D IN THE EVE  07/05/18   [provider]  buPROPion (WELLBUTRIN SR) 150 MG 12 hr tablet Take 1 tablet (150 mg total) by mouth daily. 11/21/17   [name], MD  cetirizine (ZYRTEC) 10 MG tablet Take 10 mg by mouth daily with supper.     [provider]  Cholecalciferol (VITAMIN D) 2000 units tablet Take 2,000 Units by mouth daily.    [provider]  diltiazem (CARDIZEM) 30 MG tablet Take 30 mg by mouth as needed.    [provider]  dofetilide (TIKOSYN) 250 MCG capsule Take 1 capsule (250 mcg total) by mouth 2 (two) times daily. Needs appt for further refills 10/11/18   Larey Dresser, MD  fluticasone [facility]) 50 MCG/ACT nasal spray Place 2 sprays into both nostrils daily. 09/28/18   Brand Males, MD  Fluticasone Furoate (ARNUITY ELLIPTA) 200 MCG/ACT AEPB Inhale 1 puff into the lungs daily. 09/28/18   Brand Males, MD  furosemide (LASIX) 20 MG tablet Take 1 tablet (20 mg total) by mouth daily. 12/13/18 03/13/19  Larey Dresser, MD  LORazepam (ATIVAN) 1 MG tablet TAKE 1/2 TO 1 TABLET BY MOUTH AT BEDTIME AS NEEDED 10/11/18   Burchette, Alinda Sierras, MD  mometasone (NASONEX) 50 MCG/ACT nasal spray Place 1 spray into the nose daily as needed (for allergies).     [provider]  nitroGLYCERIN (NITROSTAT) 0.4 MG SL tablet Place 1 tablet (0.4 mg total) under the tongue every 5 (five) minutes x 3 doses as needed for chest pain. 01/04/17   [name], MD  omeprazole (PRILOSEC OTC) 20 MG tablet Take 1 tablet (20 mg total) by mouth daily. 09/17/13   [name], MD  potassium chloride (KLOR-CON M10) 10 MEQ tablet Take 1 tablet (10 mEq total) by mouth daily. 12/13/18   Larey Dresser, MD  Propylene Glycol (SYSTANE BALANCE) 0.6 % SOLN Apply 1 drop to eye as needed (dry eyes).    [provider]  traMADol (ULTRAM) 50 MG tablet Take 1 tablet (50 mg total) by mouth every 12 (twelve) hours as needed. 07/07/18   Burchette, Alinda Sierras, MD    Family History Family  History  Problem Relation Age of Onset   Coronary artery disease Mother        deceased   Deep vein thrombosis Sister    Coronary artery disease Brother    Asthma Brother    Colon cancer Neg Hx     Social History Social History   Tobacco Use   Smoking status: Never Smoker   Smokeless tobacco: Never Used  Substance Use Topics   Alcohol use: No   Drug use: No     Allergies   Tylenol [acetaminophen], Azithromycin, Advil [ibuprofen], and Asa [aspirin]   Review of Systems Review of Systems  Neurological: Positive  for weakness.  All other systems reviewed and are negative.    Physical Exam Updated Vital Signs BP (!) 140/97    Pulse (!) 116    Temp 98.3 F (36.8 C) (Oral)    Resp 18    Ht _0  (1.753 m)    Wt 72.8 kg    SpO2 90%    BMI 23.70 kg/m   Physical Exam Vitals signs and nursing note reviewed.  Constitutional:      Appearance: Normal appearance.     Comments: Tired, dehydrated   HENT:     Head: Normocephalic.     Nose: Nose normal.     Mouth/Throat:     Mouth: Mucous membranes are dry.  Eyes:     Extraocular Movements: Extraocular movements intact.     Pupils: Pupils are equal, round, and reactive to light.  Neck:     Musculoskeletal: Normal range of motion.  Cardiovascular:     Pulses: Normal pulses.     Comments: Tachycardic  Pulmonary:     Effort: Pulmonary effort is normal.  Abdominal:     General: Abdomen is flat.  Musculoskeletal: Normal range of motion.  Skin:    General: Skin is warm.     Capillary Refill: Capillary refill takes less than 2 seconds.  Neurological:     General: No focal deficit present.     Mental Status: He is oriented to person, place, and time.     Comments: CN 2- 12 intact, nl strength throughout   Psychiatric:        Mood and Affect: Mood normal.      ED Treatments / Results  Labs (all labs ordered are listed, but only abnormal results are displayed) Labs Reviewed  LACTIC ACID, PLASMA - Abnormal;  Notable for the following components:      Result Value   Lactic Acid, Venous 3.1 (*)    All other components within normal limits  COMPREHENSIVE METABOLIC PANEL - Abnormal; Notable for the following components:   Sodium 127 (*)    Chloride 96 (*)    CO2 19 (*)    Glucose, Bld 131 (*)    BUN 67 (*)    Creatinine, Ser 2.70 (*)    Calcium 7.8 (*)    Total Protein 5.1 (*)    Albumin 2.6 (*)    AST 43 (*)    Total Bilirubin 1.4 (*)    GFR calc non Af Amer 20 (*)    GFR calc Af Amer 23 (*)    All other components within normal limits  CBC WITH DIFFERENTIAL/PLATELET - Abnormal; Notable for the following components:   WBC 17.0 (*)    RBC 3.71 (*)    Hemoglobin 11.3 (*)    HCT 34.0 (*)    Platelets 72 (*)    Neutro Abs 14.2 (*)    Lymphs Abs 0.3 (*)    Abs Immature Granulocytes 1.46 (*)    All other components within normal limits  PROTIME-INR - Abnormal; Notable for the following components:   Prothrombin Time 30.6 (*)    INR 2.9 (*)    All other components within normal limits  URINALYSIS, ROUTINE W REFLEX MICROSCOPIC - Abnormal; Notable for the following components:   Color, Urine AMBER (*)    APPearance CLOUDY (*)    Hgb urine dipstick MODERATE (*)    Protein, ur 100 (*)    Leukocytes,Ua LARGE (*)    WBC, UA >50 (*)    Bacteria, UA  MANY (*)    All other components within normal limits  BRAIN NATRIURETIC PEPTIDE - Abnormal; Notable for the following components:   B Natriuretic Peptide 334.3 (*)    All other components within normal limits  CULTURE, BLOOD (ROUTINE X 2)  CULTURE, BLOOD (ROUTINE X 2)  URINE CULTURE  SARS CORONAVIRUS 2 (TAT 6-24 HRS)  APTT  LACTIC ACID, PLASMA    EKG EKG Interpretation  Date/Time:  Tuesday December 26 2018 15:34:04 EST Ventricular Rate:  79 PR Interval:    QRS Duration: 97 QT Interval:  452 QTC Calculation: 519 R Axis:   51 Text Interpretation: Sinus rhythm RSR' in V1 or V2, probably normal variant Borderline T abnormalities,  inferior leads Prolonged QT interval QT prolonged new since previous Confirmed by Wandra Arthurs [phone]) on 12/26/2018 3:45:16 PM   Radiology Ct Head Wo Contrast  Result Date: 12/26/2018 CLINICAL DATA:  Head trauma syncopal episode EXAM: CT HEAD WITHOUT CONTRAST CT CERVICAL SPINE WITHOUT CONTRAST TECHNIQUE: Multidetector CT imaging of the head and cervical spine was performed following the standard protocol without intravenous contrast. Multiplanar CT image reconstructions of the cervical spine were also generated. COMPARISON:  CT brain and cervical spine 03/20/2018 FINDINGS: CT HEAD FINDINGS Brain: No acute territorial infarction, hemorrhage, or intracranial mass is seen. Similar appearance of encephalomalacia/chronic infarcts within the right frontal and parietal lobes. Atrophy. Mild small vessel ischemic changes of the white matter. Stable ventricle size Vascular: No hyperdense vessel. Vertebral and carotid vascular calcification Skull: No fracture.  Right frontal osteoma Sinuses/Orbits: Interval extensive opacification of the ethmoid and maxillary sinuses with fluid levels in the sphenoid sinuses. Other: None CT CERVICAL SPINE FINDINGS Alignment: Trace anterolisthesis C4 on C5 as before. Facet alignment normal Skull base and vertebrae: No acute fracture. No primary bone lesion or focal pathologic process. Soft tissues and spinal canal: No prevertebral fluid or swelling. No visible canal hematoma. Disc levels: Moderate degenerative changes C5-C6 and C6-C7. Multiple level facet degenerative change. Upper chest: Negative. Other: None IMPRESSION: 1. No CT evidence for acute intracranial abnormality. Atrophy and chronic infarct in the right frontal and parietal lobes. Mild small vessel ischemic changes of the white matter 2. No acute osseous abnormality of the cervical spine 3. Pansinusitis Electronically Signed   By: Donavan Foil M.D.   On: 12/26/2018 18:58   Dg Pelvis Portable  Result Date:  12/26/2018 CLINICAL DATA:  Recent fall EXAM: PORTABLE PELVIS 1-2 VIEWS COMPARISON:  May 18, 2017 FINDINGS: Small osseous fragment seen inferior to the right femoral head which could represent a small chip fracture. there is sclerotic slightly expansile osseous lesion again noted within the left inferior pubic rami, and ilium. IMPRESSION: 1. Small osseous fragment seen adjacent to the inferior right femoral head which could represent a small chip fracture. If further evaluation is required, would recommend cross-sectional imaging. 2. Sclerotic osseous lesion within the left pelvis as on the prior CT May 18, 2017. Electronically Signed   By: Prudencio Pair M.D.   On: 12/26/2018 16:11   Dg Chest Port 1 View  Result Date: 12/26/2018 CLINICAL DATA:  Weakness and hypertension EXAM: PORTABLE CHEST 1 VIEW COMPARISON:  February 25, 2018 FINDINGS: There is mild cardiomegaly. Aortic knob calcifications. Both lungs are clear. The visualized skeletal structures are unremarkable. Surgical clips seen at the GE junction. IMPRESSION: No acute cardiopulmonary process. Electronically Signed   By: Prudencio Pair M.D.   On: 12/26/2018 16:08   Dg Hip Unilat W Or Wo Pelvis 2-3 Views Right  Result Date: 12/26/2018 CLINICAL DATA:  Fall.  Pain. EXAM: DG HIP (WITH OR WITHOUT PELVIS) 2-3V RIGHT COMPARISON:  Today's CT, dictated separately. FINDINGS: No acute fracture or dislocation.  Sacroiliac joints are symmetric. Sclerosis involving the left inferior pubic ramus was present back on the CT of 05/18/2017, suspicious for metastatic disease. IMPRESSION: No acute osseous abnormality. Electronically Signed   By: Abigail Miyamoto M.D.   On: 12/26/2018 18:55    Procedures Procedures (including critical care time)  CRITICAL CARE Performed by: Wandra Arthurs   Total critical care time: 30 minutes  Critical care time was exclusive of separately billable procedures and treating other patients.  Critical care was necessary to treat or  prevent imminent or life-threatening deterioration.  Critical care was time spent personally by me on the following activities: development of treatment plan with patient and/or surrogate as well as nursing, discussions with consultants, evaluation of patient's response to treatment, examination of patient, obtaining history from patient or surrogate, ordering and performing treatments and interventions, ordering and review of laboratory studies, ordering and review of radiographic studies, pulse oximetry and re-evaluation of patient's condition.   Medications Ordered in ED Medications  vancomycin variable dose per unstable renal function (pharmacist dosing) (has no administration in time range)  ceFEPIme (MAXIPIME) 2 g in sodium chloride 0.9 % 100 mL IVPB (has no administration in time range)  sodium chloride 0.9 % bolus 1,000 mL (0 mLs Intravenous Stopped 12/26/18 1911)    And  sodium chloride 0.9 % bolus 1,000 mL (0 mLs Intravenous Stopped 12/26/18 1809)    And  sodium chloride 0.9 % bolus 250 mL (0 mLs Intravenous Stopped 12/26/18 1910)  ceFEPIme (MAXIPIME) 2 g in sodium chloride 0.9 % 100 mL IVPB (0 g Intravenous Stopped 12/26/18 1704)  metroNIDAZOLE (FLAGYL) IVPB 500 mg (0 mg Intravenous Stopped 12/26/18 1809)  vancomycin (VANCOCIN) 1,500 mg in sodium chloride 0.9 % 500 mL IVPB (1,500 mg Intravenous New Bag/Given 12/26/18 1657)     Initial Impression / Assessment and Plan / ED Course  I have reviewed the triage vital signs and the nursing notes.  Pertinent labs & imaging results that were available during my care of the patient were reviewed by me and considered in my medical decision making (see chart for details).        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  Patient is hypotensive in the ED .  Patient is on Eliquis but denies any head injury. He had possible COVID exposure and COVID was pending. Will do sepsis workup and activate code sepsis. Will  give empiric abx. Will retest COVID today \  7:29 PM Cr is elevated 2.7. CT showed R perinephric stranding. UA + UTI. Likely pyelonephritis. Initial pelvis xray showed possible hip fracture but CT showed no fracture. WBC is 17 with bands. Lactate 3.1. Will admit for severe sepsis from pyelonephritis. BP improved to 140/97 after 30 cc/kg bolus. Sepsis reassessment done and BP improved   Sepsis - Repeat Assessment  Performed at:    7:30 PM  Vitals     Blood pressure (!) 103/57, pulse 78, temperature 98.6 F (37 C), temperature source Oral, resp. rate 18, height _0  (1.753 m), weight 75.5 kg, SpO2 99 %.  Heart:     Regular rate and rhythm  Lungs:    CTA  Capillary Refill:   <2 sec  Peripheral Pulse:   Radial pulse palpable  Skin:  Normal Color      Final Clinical Impressions(s) / ED Diagnoses   Final diagnoses:  None    ED Discharge Orders    None       Drenda Freeze, MD 12/26/18 1933    Drenda Freeze, MD 01/03/19 [phone]

## 2018-12-26 NOTE — ED Notes (Signed)
Patient transported to CT 

## 2018-12-26 NOTE — Progress Notes (Addendum)
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  Pharmacy has been consulted for vancomycin and cefepime dosing for sepsis, unknown source. Of note, patient reports recent COVID exposure and awaiting test results. Scr 2.70 with current CrCl of 19.6 ml/min. Baseline Scr approximately 1.0. WBC 17.0. Afebrile.   Plan: Vancomycin 1500mg  IV x1 Monitor renal function and obtain vancomycin levels as appropriate to dose vancomycin by random levels due to AKI Cefepime 2g IV q24h Monitor renal function, cultures/sensitivities, and clinical progression      Temp (24hrs), Avg:98.3 F (36.8 C), Min:98.3 F (36.8 C), Max:98.3 F (36.8 C)  Recent Labs  Lab 12/22/18 1048  CREATININE 1.48*    Estimated Creatinine Clearance: 35.8 mL/min (A) (by C-G formula based on SCr of 1.48 mg/dL (H)).    Allergies  Allergen Reactions  . Tylenol [Acetaminophen] Rash  . Azithromycin   . Advil [Ibuprofen] Nausea Only and Rash  . Asa [Aspirin] Rash    Antimicrobials this admission: Vancomycin 11/17 >> Cefepime 11/17 >> Metronidazole x1 on 11/17  Dose adjustments this admission: N/A  Microbiology results: 11/17 BCx: collected 11/17 UCx: ordered 11/17 COVID: collected   Thank you for allowing pharmacy to be a part of this patient's care.  Cristela Felt, PharmD PGY1 Pharmacy Resident [employer]: [phone]   12/26/2018 3:53 PM

## 2018-12-26 NOTE — Telephone Encounter (Signed)
Called patient and spoke to his wife and she stated that the patient did go to have his COVID test yesterday but he has declined so much they have taken him to [facility] ER to be evaluated. I have canceled the appointment for today.  Sending as [name] [name].

## 2018-12-26 NOTE — Telephone Encounter (Signed)
Copied from [place] [id number]. Topic: Appointment Scheduling - Scheduling Inquiry for Clinic >> Dec 26, 2018  1:33 PM Celene Kras wrote: Reason for CRM: Pts wife called stating pt has had very major changes in the past 36 hours. Pts wife states pt has stopped starting conversations, but will speak when spoken too. Pts wife states she is very concerned with the way he has "dwindled" over the past few days. Pts wife states she cannot get patient into the office, but is requesting a telephone visit today or tomorrow. Please advise.   Patient is scheduled for a telephone visit this afternoon with Dr. Elease Hashimoto at 4:30.

## 2018-12-26 NOTE — ED Triage Notes (Signed)
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  Pt slid to floor, no LOC, denies any dizziness. EMS administered 500 NS PTA. Pt has a HX of CHF.  Pt had a recent exposure to Covid and has been tested, awaiting results. EMS endorses lung souns clear.   EMS vitals:  BP 88/40 CBG 144 Temp 97.7 RR 16 Sp02 97% RA

## 2018-12-26 NOTE — ED Notes (Signed)
Called pharmacy to verify medications

## 2018-12-26 NOTE — ED Notes (Signed)
Date and time results received: 12/26/18  "11:40 PM    Test: Lactic Acid Critical Value: 2.2  Name of Provider Notified: Cristescu, MD  Orders Received? Or Actions Taken?: None at this time

## 2018-12-27 ENCOUNTER — Encounter (HOSPITAL_COMMUNITY): Payer: Self-pay | Admitting: General Practice

## 2018-12-27 DIAGNOSIS — E872 Acidosis, unspecified: Secondary | ICD-10-CM | POA: Diagnosis present

## 2018-12-27 DIAGNOSIS — N12 Tubulo-interstitial nephritis, not specified as acute or chronic: Secondary | ICD-10-CM | POA: Diagnosis present

## 2018-12-27 DIAGNOSIS — R7881 Bacteremia: Secondary | ICD-10-CM

## 2018-12-27 DIAGNOSIS — R9389 Abnormal findings on diagnostic imaging of other specified body structures: Secondary | ICD-10-CM | POA: Diagnosis present

## 2018-12-27 DIAGNOSIS — A419 Sepsis, unspecified organism: Secondary | ICD-10-CM

## 2018-12-27 LAB — BLOOD CULTURE ID PANEL (REFLEXED)

## 2018-12-27 LAB — BASIC METABOLIC PANEL
Anion gap: 14 (ref 5–15)
BUN: 61 mg/dL — ABNORMAL HIGH (ref 8–23)
CO2: 17 mmol/L — ABNORMAL LOW (ref 22–32)
Calcium: 8.2 mg/dL — ABNORMAL LOW (ref 8.9–10.3)
Chloride: 103 mmol/L (ref 98–111)
Creatinine, Ser: 1.91 mg/dL — ABNORMAL HIGH (ref 0.61–1.24)
GFR calc Af Amer: 36 mL/min — ABNORMAL LOW (ref >60)
GFR calc non Af Amer: 31 mL/min — ABNORMAL LOW (ref >60)
Glucose, Bld: 115 mg/dL — ABNORMAL HIGH (ref 70–99)
Potassium: 4 mmol/L (ref 3.5–5.1)
Sodium: 134 mmol/L — ABNORMAL LOW (ref 135–145)

## 2018-12-27 LAB — CBC WITH DIFFERENTIAL/PLATELET
Abs Immature Granulocytes: 1.56 10*3/uL — ABNORMAL HIGH (ref 0.00–0.07)
Basophils Absolute: 0 10*3/uL (ref 0.0–0.1)
Basophils Relative: 0 %
Eosinophils Absolute: 0 10*3/uL (ref 0.0–0.5)
Eosinophils Relative: 0 %
HCT: 34.7 % — ABNORMAL LOW (ref 39.0–52.0)
Hemoglobin: 11.3 g/dL — ABNORMAL LOW (ref 13.0–17.0)
Immature Granulocytes: 10 %
Lymphocytes Relative: 2 %
Lymphs Abs: 0.3 10*3/uL — ABNORMAL LOW (ref 0.7–4.0)
MCH: 30.1 pg (ref 26.0–34.0)
MCHC: 32.6 g/dL (ref 30.0–36.0)
MCV: 92.3 fL (ref 80.0–100.0)
Monocytes Absolute: 0.8 10*3/uL (ref 0.1–1.0)
Monocytes Relative: 5 %
Neutro Abs: 12.6 10*3/uL — ABNORMAL HIGH (ref 1.7–7.7)
Neutrophils Relative %: 83 %
Platelets: 59 10*3/uL — ABNORMAL LOW (ref 150–400)
RBC: 3.76 MIL/uL — ABNORMAL LOW (ref 4.22–5.81)
RDW: 13.8 % (ref 11.5–15.5)
WBC: 15.2 10*3/uL — ABNORMAL HIGH (ref 4.0–10.5)
nRBC: 0 % (ref 0.0–0.2)

## 2018-12-27 LAB — COMPREHENSIVE METABOLIC PANEL
ALT: 24 U/L (ref 0–44)
AST: 37 U/L (ref 15–41)
Albumin: 2.5 g/dL — ABNORMAL LOW (ref 3.5–5.0)
Alkaline Phosphatase: 70 U/L (ref 38–126)
Anion gap: 12 (ref 5–15)
BUN: 66 mg/dL — ABNORMAL HIGH (ref 8–23)
CO2: 17 mmol/L — ABNORMAL LOW (ref 22–32)
Calcium: 8 mg/dL — ABNORMAL LOW (ref 8.9–10.3)
Chloride: 103 mmol/L (ref 98–111)
Creatinine, Ser: 2.16 mg/dL — ABNORMAL HIGH (ref 0.61–1.24)
GFR calc Af Amer: 31 mL/min — ABNORMAL LOW (ref >60)
GFR calc non Af Amer: 27 mL/min — ABNORMAL LOW (ref >60)
Glucose, Bld: 99 mg/dL (ref 70–99)
Potassium: 4.2 mmol/L (ref 3.5–5.1)
Sodium: 132 mmol/L — ABNORMAL LOW (ref 135–145)
Total Bilirubin: 1.4 mg/dL — ABNORMAL HIGH (ref 0.3–1.2)
Total Protein: 5.3 g/dL — ABNORMAL LOW (ref 6.5–8.1)

## 2018-12-27 LAB — PROTIME-INR
INR: 2 — ABNORMAL HIGH (ref 0.8–1.2)
Prothrombin Time: 22.1 seconds — ABNORMAL HIGH (ref 11.4–15.2)

## 2018-12-27 LAB — NOVEL CORONAVIRUS, NAA: SARS-CoV-2, NAA: NOT DETECTED

## 2018-12-27 LAB — PROCALCITONIN: Procalcitonin: 8.88 ng/mL

## 2018-12-27 LAB — LACTIC ACID, PLASMA: Lactic Acid, Venous: 1.4 mmol/L (ref 0.5–1.9)

## 2018-12-27 LAB — CORTISOL-AM, BLOOD: Cortisol - AM: 31.2 ug/dL — ABNORMAL HIGH (ref 6.7–22.6)

## 2018-12-27 MED ORDER — IPRATROPIUM-ALBUTEROL 0.5-2.5 (3) MG/3ML IN SOLN: 3.0000 mL | Freq: Four times a day (QID) | RESPIRATORY_TRACT | Status: DC | PRN

## 2018-12-27 MED ORDER — RISAQUAD PO CAPS
1.0000 | ORAL_CAPSULE | Freq: Every day | ORAL | Status: DC
  Administered 2018-12-27 – 2018-12-31 (×5): 1 via ORAL
  Filled 2018-12-27 (×6): qty 1

## 2018-12-27 MED ORDER — LORAZEPAM 1 MG PO TABS
0.5000 mg | ORAL_TABLET | Freq: Once | ORAL | Status: AC
  Administered 2018-12-27: 0.5 mg via ORAL
  Filled 2018-12-27: qty 1

## 2018-12-27 MED ORDER — LORAZEPAM 1 MG PO TABS
0.5000 mg | ORAL_TABLET | Freq: Two times a day (BID) | ORAL | Status: DC | PRN
  Administered 2018-12-27 – 2018-12-29 (×3): 1 mg via ORAL
  Administered 2018-12-30: 0.5 mg via ORAL
  Filled 2018-12-27 (×4): qty 1

## 2018-12-27 MED ORDER — IPRATROPIUM-ALBUTEROL 0.5-2.5 (3) MG/3ML IN SOLN
3.0000 mL | Freq: Four times a day (QID) | RESPIRATORY_TRACT | Status: DC
  Administered 2018-12-27 (×2): 3 mL via RESPIRATORY_TRACT
  Filled 2018-12-27 (×2): qty 3

## 2018-12-27 MED ORDER — MAGNESIUM SULFATE IN D5W 1-5 GM/100ML-% IV SOLN
1.0000 g | Freq: Once | INTRAVENOUS | Status: AC
  Administered 2018-12-27: 1 g via INTRAVENOUS
  Filled 2018-12-27 (×2): qty 100

## 2018-12-27 MED ORDER — SODIUM CHLORIDE 0.9 % IV SOLN
2.0000 g | INTRAVENOUS | Status: DC
  Administered 2018-12-27 – 2018-12-31 (×5): 2 g via INTRAVENOUS
  Filled 2018-12-27 (×4): qty 2
  Filled 2018-12-27 (×3): qty 20

## 2018-12-27 NOTE — ED Notes (Signed)
PT at bedside.

## 2018-12-27 NOTE — ED Notes (Signed)
Lunch tray ordered 

## 2018-12-27 NOTE — ED Notes (Signed)
Report given to RN on 40M, pt is using urinal.  Will transport up to floor shortly.  Notified his wife of bed assignment.

## 2018-12-27 NOTE — Progress Notes (Signed)
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   Assessment:  In ED patient was found to be hypotensive with sepsis and UTI. CT of abdomen/pelvis showing perinephritic stranding of both kineys. Blood cultures now growing 4/4 GNR. Rapid blood identification PCR detects EColi (KPC negative).   Name of physician (or Provider) Contacted: Dr. Eliseo Squires  Current antibiotics: Cefepime  Changes to prescribed antibiotics recommended:  Recommendations accepted by provider - Discontinue Vancomycin and [place]arrow to Ceftriaxone 2g IV every 24 hours.   Results for orders placed or performed during the hospital encounter of 12/26/18  Blood Culture ID Panel (Reflexed) (Collected: 12/26/2018  4:11 PM)  Result Value Ref Range   Enterococcus species NOT DETECTED NOT DETECTED   Listeria monocytogenes NOT DETECTED NOT DETECTED   Staphylococcus species NOT DETECTED NOT DETECTED   Staphylococcus aureus (BCID) NOT DETECTED NOT DETECTED   Streptococcus species NOT DETECTED NOT DETECTED   Streptococcus agalactiae NOT DETECTED NOT DETECTED   Streptococcus pneumoniae NOT DETECTED NOT DETECTED   Streptococcus pyogenes NOT DETECTED NOT DETECTED   Acinetobacter baumannii NOT DETECTED NOT DETECTED   Enterobacteriaceae species DETECTED (A) NOT DETECTED   Enterobacter cloacae complex NOT DETECTED NOT DETECTED   Escherichia coli DETECTED (A) NOT DETECTED   Klebsiella oxytoca NOT DETECTED NOT DETECTED   Klebsiella pneumoniae NOT DETECTED NOT DETECTED   Proteus species NOT DETECTED NOT DETECTED   Serratia marcescens NOT DETECTED NOT DETECTED   Carbapenem resistance NOT DETECTED NOT DETECTED   Haemophilus influenzae NOT DETECTED NOT DETECTED   Neisseria meningitidis NOT DETECTED NOT DETECTED   Pseudomonas aeruginosa NOT  DETECTED NOT DETECTED   Candida albicans NOT DETECTED NOT DETECTED   Candida glabrata NOT DETECTED NOT DETECTED   Candida krusei NOT DETECTED NOT DETECTED   Candida parapsilosis NOT DETECTED NOT DETECTED   Candida tropicalis NOT DETECTED NOT DETECTED    [name] 12/27/2018  7:44 AM

## 2018-12-27 NOTE — ED Notes (Signed)
Visitor at bedside.

## 2018-12-27 NOTE — ED Notes (Signed)
Breakfast Ordered 

## 2018-12-27 NOTE — Evaluation (Signed)
Physical Therapy Evaluation Patient Details Name: [name] RUSEK, [name] [name] MRN: [id number] DOB: [birth year] Today's Date: 12/27/2018   History of Present Illness  [name] Spence, [name] [name] is a 85 y.o. male with medical history significant of asthma, coronary artery disease,, paroxysmal A fibrillation, hypertension, congestive heart failure diastolic dysfunction, chronic kidney disease stage II, hypercholesterolemia, prostate cancer came with a chief complaint of generalized weakness.  He was found to be hypotensive in the 70s  Clinical Impression  Patient presents with decreased mobility and decreased activity tolerance. Usually very active and still enjoys golfing.  He was orthostatic today with mobility and limited due to weakness.  Feel he will benefit from skilled PT in the acute setting to allow return home with family support and likely will need follow up HHPT.     Follow Up Recommendations Home health PT;Supervision/Assistance - 24 hour    Equipment Recommendations  None recommended by PT    Recommendations for Other Services       Precautions / Restrictions Precautions Precautions: Fall Precaution Comments: watch BP      Mobility  Bed Mobility Overal bed mobility: Needs Assistance Bed Mobility: Supine to Sit;Sit to Supine     Supine to sit: Supervision Sit to supine: Min guard   General bed mobility comments: assist for legs into bed  Transfers Overall transfer level: Needs assistance Equipment used: Rolling walker (2 wheeled) Transfers: Sit to/from Stand Sit to Stand: Min guard         General transfer comment: assist for safety  Ambulation/Gait Ambulation/Gait assistance: Min guard;Min assist Gait Distance (Feet): 80 Feet Assistive device: Rolling walker (2 wheeled) Gait Pattern/deviations: Shuffle;Decreased stride length;Trunk flexed;Step-through pattern     General Gait Details: reported feeling tired in hallway so returned to room increased assist for safety due  to rushing back to room and needing to return to supine, noted BP after in supine 64/54 RN in the room  Stairs            Wheelchair Mobility    Modified Rankin (Stroke Patients Only)       Balance Overall balance assessment: Needs assistance Sitting-balance support: Feet supported Sitting balance-Leahy Scale: Fair     Standing balance support: Bilateral upper extremity supported Standing balance-Leahy Scale: Poor Standing balance comment: reliant on UE support today                             Pertinent Vitals/Pain Pain Assessment: No/denies pain    Home Living Family/patient expects to be discharged to:: Private residence Living Arrangements: Spouse/significant other Available Help at Discharge: Family;Available 24 hours/day Type of Home: House Home Access: Stairs to enter Entrance Stairs-Rails: Can reach both Entrance Stairs-Number of Steps: 2 Home Layout: Multi-level;Able to live on main level with bedroom/bathroom Home Equipment: Grab bars - tub/shower;Shower seat - built in      Prior Function Level of Independence: Independent         Comments: played golf last week     Hand Dominance        Extremity/Trunk Assessment   Upper Extremity Assessment Upper Extremity Assessment: Generalized weakness    Lower Extremity Assessment Lower Extremity Assessment: Generalized weakness    Cervical / Trunk Assessment Cervical / Trunk Assessment: Kyphotic  Communication   Communication: HOH  Cognition Arousal/Alertness: Awake/alert Behavior During Therapy: WFL for tasks assessed/performed Overall Cognitive Status: Within Functional Limits for tasks assessed  General Comments: wife answering for patient due to pt feeling SOB and with HOH      General Comments General comments (skin integrity, edema, etc.): Encouraged fluids and pt still on IV fluids at 138ml/hr RN aware of hypotension     Exercises     Assessment/Plan    PT Assessment Patient needs continued PT services  PT Problem List Decreased strength;Decreased activity tolerance;Decreased mobility;Decreased safety awareness;Decreased balance;Cardiopulmonary status limiting activity       PT Treatment Interventions Stair training;DME instruction;Therapeutic activities;Balance training;Patient/family education;Therapeutic exercise;Functional mobility training;Gait training    PT Goals (Current goals can be found in the Care Plan section)  Acute Rehab PT Goals Patient Stated Goal: to return home PT Goal Formulation: With patient/family Time For Goal Achievement: 01/10/19 Potential to Achieve Goals: Good    Frequency Min 3X/week   Barriers to discharge        Co-evaluation               AM-PAC PT "6 Clicks" Mobility  Outcome Measure Help needed turning from your back to your side while in a flat bed without using bedrails?: None Help needed moving from lying on your back to sitting on the side of a flat bed without using bedrails?: None Help needed moving to and from a bed to a chair (including a wheelchair)?: None Help needed standing up from a chair using your arms (e.g., wheelchair or bedside chair)?: A Little Help needed to walk in hospital room?: A Little Help needed climbing 3-5 steps with a railing? : A Little 6 Click Score: 21    End of Session   Activity Tolerance: Patient limited by fatigue Patient left: in bed;with call bell/phone within reach;with family/visitor present;with nursing/sitter in room Nurse Communication: Other (comment)(decreased BP) PT Visit Diagnosis: Other abnormalities of gait and mobility (R26.89);Muscle weakness (generalized) (M62.81)    Time: [id number] PT Time Calculation (min) (ACUTE ONLY): 23 min   Charges:   PT Evaluation $PT Eval Moderate Complexity: 1 Mod PT Treatments $Gait Training: 8-22 mins        [name] [name], [place] [facility] [phone] 12/27/2018   [name] [name] 12/27/2018, 12:55 PM

## 2018-12-27 NOTE — Progress Notes (Signed)
[facility] PROGRESS NOTE  [name], MD KO:[id number] DOB: 06-17-1929 DOA: 12/26/2018 PCP: [name], MD  Assessment/Plan:   #1. Sepsis. Related to UTI/pyelonephritis and bacteremia. Presented with hypotension, tachycardia and tachypnea as well as elevated lactic acid and procalcitonin and acute kidney injury superimposed on CKD II. Not hypoxic some wheezing. Chest xray with no acute cardio pulmonary process. Renal CT with bladder wall thickening and probable outlet obstruction due to prostatomegaly. Blood cultures with 4/4 gram neg rods.  Provided with fluid resuscitation and broad spectrum antibiotics initially. Lactic acid normalized.  Antibiotics adjusted as indicated. He appears ill but non-toxic and currently hemodynamically stable.  -continue IV fluids -duonebs -flutter valve -rocephin per pharmacy -monitor intake and output -follow urine culture -supportive therapy -consider urology consult once stablized -monitor closely  #2. Acute kidney injury superimposed CKD II. Related to above. Creatinine 2.1 this am trending down from 2.7 yesterday.  -continue IV fluids -hold nephrotoxins -prn bladderscan and in and out cath -bmet this afternoon -monitor intake and output  #3. Pyelonephritis/ bacteremia. Renal US with ? Obstruction.  -IV fluids as noted above -IV antibiotics as noted above -monitor urine output -prn bedside bladder scan and in and out cath  -will notify dr Beatrix Fetters of admission  #4. Metabolic acidosis. Related to #1 in setting of loose stool. co2 17 this am down from 19. Appears dry.  -IV fluids as noted above -bmet this afternoon -gi pathogen panel  #5. Abnormal xray of pelvis. Hx recent fall. Pelvic xray with small osseous fragment seen adjacent to the inferior right femoral head which could represent a small chip fracture. No pain -PT evaluation  #6. Afib. Currently SR. Home meds include eliquis, atenolol, cardizem, tikosyn,  -holding  eliquis and atenolol -continue cardizem and tikosyn -monitor   #7. Chronic diastolic heart failure. Home meds as noted #6. Appears dry. BNP 334.  Echo 09/2017 with EF 60% and grade 1 diastolic dysfunction. Wife reports lasix stopped last week as he was "so weak".  -meds as noted above -daily weights -intake and output  #8.hx mild persistent asthma without complication. Some audible wheezing. Not hypoxic. No increased work if breathing. Chest xray as noted above.  -duonebs -flutter valve -monitor  #9. Anxiety. Stable at baseline.  -home meds  #10. Hypertension. BP on low end of normal. Home meds as noted above -holding BB -monitor closely  #11. CAD s/p pci 2010. No chest pain. ekg as noted -continue home meds as noted   Code Status: full Family Communication: wife at bedside Disposition Plan: home when ready   Consultants:    Procedures:    Antibiotics:  Vancomycin 11/17-11/18  Cefepime 11/17-11/18  Flagyl 11/17 1 dose  Rocephin 11/18>>  HPI/Subjective: Awake alert but lethargic. Denies pain/discomfort. Requesting breathing treatment for wheeze  Objective: Vitals:   12/27/18 1100 12/27/18 1130  BP: (!) 103/55 105/62  Pulse: 80 87  Resp: 20 (!) 23  Temp:    SpO2:      Intake/Output Summary (Last 24 hours) at 12/27/2018 1216 Last data filed at 12/27/2018 [id number] Gross per 24 hour  Intake -  Output 300 ml  Net -300 ml   Filed Weights   12/26/18 1813  Weight: 72.8 kg    Exam:   General:  Appears acutely ill, pale somewhat frail   Cardiovascular: rrr no mgr trace LE edema  Respiratory: normal effort audible wheeze BS diminished no crackles  Abdomen: non-distended +BS no guarding or rebounding  Musculoskeletal: joints without swelling/erythema  Data Reviewed: Basic Metabolic Panel: Recent Labs  Lab 12/22/18 1048 12/26/18 1540 12/27/18 0332  NA 137 127* 132*  K 4.8 4.7 4.2  CL 101 96* 103  CO2 27 19* 17*  GLUCOSE 112* 131* 99  BUN  26* 67* 66*  CREATININE 1.48* 2.70* 2.16*  CALCIUM 9.2 7.8* 8.0*  MG  --  1.7  --    Liver Function Tests: Recent Labs  Lab 12/26/18 1540 12/27/18 0332  AST 43* 37  ALT 23 24  ALKPHOS 68 70  BILITOT 1.4* 1.4*  PROT 5.1* 5.3*  ALBUMIN 2.6* 2.5*   No results for input(s): LIPASE, AMYLASE in the last 168 hours. No results for input(s): AMMONIA in the last 168 hours. CBC: Recent Labs  Lab 12/26/18 1540 12/27/18 0332  WBC 17.0* 15.2*  NEUTROABS 14.2* 12.6*  HGB 11.3* 11.3*  HCT 34.0* 34.7*  MCV 91.6 92.3  PLT 72* 59*   Cardiac Enzymes: No results for input(s): CKTOTAL, CKMB, CKMBINDEX, TROPONINI in the last 168 hours. BNP (last 3 results) Recent Labs    12/26/18 1552  BNP 334.3*    ProBNP (last 3 results) No results for input(s): PROBNP in the last 8760 hours.  CBG: No results for input(s): GLUCAP in the last 168 hours.  Recent Results (from the past 240 hour(s))  Novel Coronavirus, NAA ([facility])     Status: None   Collection Time: 12/25/18 12:00 AM   Specimen: Nasopharyngeal(NP) swabs in vial transport medium   NASOPHARYNGE  TESTING  Result Value Ref Range Status   SARS-CoV-2, NAA Not Detected Not Detected Final    Comment: This nucleic acid amplification test was developed and its performance characteristics determined by [employer]. Nucleic acid amplification tests include PCR and TMA. This test has not been FDA cleared or approved. This test has been authorized by FDA under an Emergency Use Authorization (EUA). This test is only authorized for the duration of time the declaration that circumstances exist justifying the authorization of the emergency use of in vitro diagnostic tests for detection of SARS-CoV-2 virus and/or diagnosis of COVID-19 infection under section 564(b)(1) of the Act, 21 U.S.C. [id number]) (1), unless the authorization is terminated or revoked sooner. When diagnostic testing is negative, the possibility of a false negative  result should be considered in the context of a patient's recent exposures and the presence of clinical signs and symptoms consistent with COVID-19. An individual without symptoms of COVID-19 and who is not shedding SARS-CoV-2 virus would  expect to have a negative (not detected) result in this assay.   Blood Culture (routine x 2)     Status: None (Preliminary result)   Collection Time: 12/26/18  4:03 PM   Specimen: BLOOD  Result Value Ref Range Status   Specimen Description BLOOD LEFT ANTECUBITAL  Final   Special Requests   Final    BOTTLES DRAWN AEROBIC AND ANAEROBIC Blood Culture results may not be optimal due to an inadequate volume of blood received in culture bottles   Culture  Setup Time   Final    IN BOTH AEROBIC AND ANAEROBIC BOTTLES GRAM NEGATIVE RODS CRITICAL VALUE NOTED.  VALUE IS CONSISTENT WITH PREVIOUSLY REPORTED AND CALLED VALUE.    Culture   Final    NO GROWTH < 24 HOURS Performed at [facility], [place] [address] [place], [place] [address]    Report Status PENDING  Incomplete  Blood Culture (routine x 2)     Status: None (Preliminary result)   Collection  Time: 12/26/18  4:11 PM   Specimen: BLOOD RIGHT ARM  Result Value Ref Range Status   Specimen Description BLOOD RIGHT ARM  Final   Special Requests   Final    BOTTLES DRAWN AEROBIC AND ANAEROBIC Blood Culture adequate volume   Culture  Setup Time   Final    IN BOTH AEROBIC AND ANAEROBIC BOTTLES GRAM NEGATIVE RODS Organism ID to follow CRITICAL RESULT CALLED TO, READ BACK BY AND VERIFIED WITH: C AMEND PHARMD 12/27/18 0625 JDW    Culture   Final    NO GROWTH < 24 HOURS Performed at [facility], [place] [address] [place], [place] [address]    Report Status PENDING  Incomplete  Blood Culture ID Panel (Reflexed)     Status: Abnormal   Collection Time: 12/26/18  4:11 PM  Result Value Ref Range Status   Enterococcus species NOT DETECTED NOT DETECTED Final   Listeria monocytogenes NOT DETECTED NOT  DETECTED Final   Staphylococcus species NOT DETECTED NOT DETECTED Final   Staphylococcus aureus (BCID) NOT DETECTED NOT DETECTED Final   Streptococcus species NOT DETECTED NOT DETECTED Final   Streptococcus agalactiae NOT DETECTED NOT DETECTED Final   Streptococcus pneumoniae NOT DETECTED NOT DETECTED Final   Streptococcus pyogenes NOT DETECTED NOT DETECTED Final   Acinetobacter baumannii NOT DETECTED NOT DETECTED Final   Enterobacteriaceae species DETECTED (A) NOT DETECTED Final    Comment: Enterobacteriaceae represent a large family of gram-negative bacteria, not a single organism. CRITICAL RESULT CALLED TO, READ BACK BY AND VERIFIED WITH: C AMEND PHARMD 12/27/18 625 JDW    Enterobacter cloacae complex NOT DETECTED NOT DETECTED Final   Escherichia coli DETECTED (A) NOT DETECTED Final    Comment: CRITICAL RESULT CALLED TO, READ BACK BY AND VERIFIED WITH: C AMEND PHARMD 12/27/18 0625 JDW    Klebsiella oxytoca NOT DETECTED NOT DETECTED Final   Klebsiella pneumoniae NOT DETECTED NOT DETECTED Final   Proteus species NOT DETECTED NOT DETECTED Final   Serratia marcescens NOT DETECTED NOT DETECTED Final   Carbapenem resistance NOT DETECTED NOT DETECTED Final   Haemophilus influenzae NOT DETECTED NOT DETECTED Final   Neisseria meningitidis NOT DETECTED NOT DETECTED Final   Pseudomonas aeruginosa NOT DETECTED NOT DETECTED Final   Candida albicans NOT DETECTED NOT DETECTED Final   Candida glabrata NOT DETECTED NOT DETECTED Final   Candida krusei NOT DETECTED NOT DETECTED Final   Candida parapsilosis NOT DETECTED NOT DETECTED Final   Candida tropicalis NOT DETECTED NOT DETECTED Final    Comment: Performed at [facility], [place] [address] [place], [place] [address]  SARS CORONAVIRUS 2 (TAT 6-24 HRS) Nasopharyngeal Nasopharyngeal Swab     Status: None   Collection Time: 12/26/18  4:35 PM   Specimen: Nasopharyngeal Swab  Result Value Ref Range Status   SARS Coronavirus 2 NEGATIVE  NEGATIVE Final    Comment: (NOTE) SARS-CoV-2 target nucleic acids are NOT DETECTED. The SARS-CoV-2 RNA is generally detectable in upper and lower respiratory specimens during the acute phase of infection. Negative results do not preclude SARS-CoV-2 infection, do not rule out co-infections with other pathogens, and should not be used as the sole basis for treatment or other patient management decisions. Negative results must be combined with clinical observations, patient history, and epidemiological information. The expected result is Negative. Fact Sheet for Patients: SugarRoll.be Fact Sheet for Healthcare Providers: https://www.woods-mathews.com/ This test is not yet approved or cleared by the [place] FDA and  has been authorized for  detection and/or diagnosis of SARS-CoV-2 by FDA under an Emergency Use Authorization (EUA). This EUA will remain  in effect (meaning this test can be used) for the duration of the COVID-19 declaration under Section 56 4(b)(1) of the Act, 21 U.S.C. section 360bbb-3(b)(1), unless the authorization is terminated or revoked sooner. Performed at [facility], [place] [address] [place], [place] [address]      Studies: Ct Head Wo Contrast  Result Date: 12/26/2018 CLINICAL DATA:  Head trauma syncopal episode EXAM: CT HEAD WITHOUT CONTRAST CT CERVICAL SPINE WITHOUT CONTRAST TECHNIQUE: Multidetector CT imaging of the head and cervical spine was performed following the standard protocol without intravenous contrast. Multiplanar CT image reconstructions of the cervical spine were also generated. COMPARISON:  CT brain and cervical spine 03/20/2018 FINDINGS: CT HEAD FINDINGS Brain: No acute territorial infarction, hemorrhage, or intracranial mass is seen. Similar appearance of encephalomalacia/chronic infarcts within the right frontal and parietal lobes. Atrophy. Mild small vessel ischemic changes of the white matter.  Stable ventricle size Vascular: No hyperdense vessel. Vertebral and carotid vascular calcification Skull: No fracture.  Right frontal osteoma Sinuses/Orbits: Interval extensive opacification of the ethmoid and maxillary sinuses with fluid levels in the sphenoid sinuses. Other: None CT CERVICAL SPINE FINDINGS Alignment: Trace anterolisthesis C4 on C5 as before. Facet alignment normal Skull base and vertebrae: No acute fracture. No primary bone lesion or focal pathologic process. Soft tissues and spinal canal: No prevertebral fluid or swelling. No visible canal hematoma. Disc levels: Moderate degenerative changes C5-C6 and C6-C7. Multiple level facet degenerative change. Upper chest: Negative. Other: None IMPRESSION: 1. No CT evidence for acute intracranial abnormality. Atrophy and chronic infarct in the right frontal and parietal lobes. Mild small vessel ischemic changes of the white matter 2. No acute osseous abnormality of the cervical spine 3. Pansinusitis Electronically Signed   By: Donavan Foil M.D.   On: 12/26/2018 18:58   Ct Cervical Spine Wo Contrast  Result Date: 12/26/2018 CLINICAL DATA:  Head trauma syncopal episode EXAM: CT HEAD WITHOUT CONTRAST CT CERVICAL SPINE WITHOUT CONTRAST TECHNIQUE: Multidetector CT imaging of the head and cervical spine was performed following the standard protocol without intravenous contrast. Multiplanar CT image reconstructions of the cervical spine were also generated. COMPARISON:  CT brain and cervical spine 03/20/2018 FINDINGS: CT HEAD FINDINGS Brain: No acute territorial infarction, hemorrhage, or intracranial mass is seen. Similar appearance of encephalomalacia/chronic infarcts within the right frontal and parietal lobes. Atrophy. Mild small vessel ischemic changes of the white matter. Stable ventricle size Vascular: No hyperdense vessel. Vertebral and carotid vascular calcification Skull: No fracture.  Right frontal osteoma Sinuses/Orbits: Interval extensive  opacification of the ethmoid and maxillary sinuses with fluid levels in the sphenoid sinuses. Other: None CT CERVICAL SPINE FINDINGS Alignment: Trace anterolisthesis C4 on C5 as before. Facet alignment normal Skull base and vertebrae: No acute fracture. No primary bone lesion or focal pathologic process. Soft tissues and spinal canal: No prevertebral fluid or swelling. No visible canal hematoma. Disc levels: Moderate degenerative changes C5-C6 and C6-C7. Multiple level facet degenerative change. Upper chest: Negative. Other: None IMPRESSION: 1. No CT evidence for acute intracranial abnormality. Atrophy and chronic infarct in the right frontal and parietal lobes. Mild small vessel ischemic changes of the white matter 2. No acute osseous abnormality of the cervical spine 3. Pansinusitis Electronically Signed   By: Donavan Foil M.D.   On: 12/26/2018 18:58   Dg Pelvis Portable  Result Date: 12/26/2018 CLINICAL DATA:  Recent fall EXAM: PORTABLE  PELVIS 1-2 VIEWS COMPARISON:  May 18, 2017 FINDINGS: Small osseous fragment seen inferior to the right femoral head which could represent a small chip fracture. there is sclerotic slightly expansile osseous lesion again noted within the left inferior pubic rami, and ilium. IMPRESSION: 1. Small osseous fragment seen adjacent to the inferior right femoral head which could represent a small chip fracture. If further evaluation is required, would recommend cross-sectional imaging. 2. Sclerotic osseous lesion within the left pelvis as on the prior CT May 18, 2017. Electronically Signed   By: Prudencio Pair M.D.   On: 12/26/2018 16:11   Dg Chest Port 1 View  Result Date: 12/26/2018 CLINICAL DATA:  Weakness and hypertension EXAM: PORTABLE CHEST 1 VIEW COMPARISON:  February 25, 2018 FINDINGS: There is mild cardiomegaly. Aortic knob calcifications. Both lungs are clear. The visualized skeletal structures are unremarkable. Surgical clips seen at the GE junction. IMPRESSION: No  acute cardiopulmonary process. Electronically Signed   By: Prudencio Pair M.D.   On: 12/26/2018 16:08   Ct Renal Stone Study  Result Date: 12/26/2018 CLINICAL DATA:  Flank pain, recurrent stone disease suspected, suspected renal failure EXAM: CT ABDOMEN AND PELVIS WITHOUT CONTRAST TECHNIQUE: Multidetector CT imaging of the abdomen and pelvis was performed following the standard protocol without IV contrast. COMPARISON:  CT 05/18/2017, MRI 02/09/2015 FINDINGS: Lower chest: Bandlike areas of opacity in the lung bases may reflect atelectasis and/or scarring. Trace right pleural effusion. Bilateral subpleural fat. Coronary artery calcifications are present as are calcifications of the mitral valve chordae tendineae and mitral annulus. Cardiac size at the upper limits of normal. Trace pericardial fluid within physiologic normal. Few pericardial calcifications may reflect sequela of prior pericarditis. Hepatobiliary: Stable 1 cm hypoattenuating cyst in the anterior left lobe liver (3/15). No concerning liver abnormality is seen. Patient is post cholecystectomy. Slight prominence of the biliary tree likely related to reservoir effect. No calcified intraductal gallstones. Pancreas: Unremarkable. No pancreatic ductal dilatation or surrounding inflammatory changes. Spleen: Normal in size without focal abnormality. Adrenals/Urinary Tract: Normal adrenal glands. Mild interval increase in the amount of bilateral perinephric stranding most notably in the right perirenal space. Redemonstration of the complex cystic lesion in the interpolar region left kidney with extension into the renal sinus. This lesion again measures 8.9 x 4.8 x 6.4 cm in size, not significantly changed from comparison study when measured at a similar level. Complete characterization of this lesion is precluded in the absence of contrast media however the degree of septation is increased from comparison MRI with with calcified septations identified on  today's study. No obstructing urolithiasis. Circumferential bladder wall thickening is noted with faint perivesicular haze. Indentation of the bladder base by the enlarged prostate Stomach/Bowel: Postsurgical changes at the diaphragmatic hiatus possibly reflecting a prior fundoplication. Stomach is otherwise unremarkable. No small bowel wall thickening or dilatation. No evidence of obstruction. A normal appendix is visualized. Portion of the colon protrudes into a left inguinal hernia without resulting obstruction at this time. Scattered colonic diverticula without focal pericolonic inflammation to suggest diverticulitis. Vascular/Lymphatic: Atherosclerotic plaque within the normal caliber aorta. No suspicious or enlarged lymph nodes in the included lymphatic chains. Reproductive: Moderate prostatomegaly. Mass effect on the bladder base. Other: Stable appearance of the left inguinal hernia containing portion of the sigmoid colon. Fat containing right inguinal hernia is present as well. No other bowel containing hernias. No free air or free fluid in the peritoneum. Right retroperitoneal free fluid tracking inferiorly possibly reactive and related to a right renal  process. Musculoskeletal: Multilevel degenerative changes are present in the imaged portions of the spine. No acute osseous abnormality or suspicious osseous lesion. Stable sclerotic lesions in L2 and the right ilium as well as involving the left acetabulum and inferior pubic ramus and left pubic body. Multiple Schmorl's node formations. IMPRESSION: 1. Increasing perinephric stranding particularly of the right kidney with reactive free fluid in the right posterior pararenal space. Given circumferential bladder wall thickening and probable outlet obstruction due to prostatomegaly, recommend correlation with urinalysis to exclude a cystitis and ascending urinary tract infection. Recently passed calculus could have a similar appearance. 2. Redemonstration of  the complex cystic lesion in the interpolar region of the left kidney with extension into the renal sinus, which again measures up to 8.9 cm in size, not significantly changed in size from comparison study when measured at a similar level when measured at a similar level. Complete characterization of this lesion is precluded on this non-contrast CT exam. 3. Stable appearance of the left inguinal hernia containing portion of the sigmoid colon. No evidence of bowel obstruction at this time. 4. Multiple sclerotic foci involving L2, the right ilium and the left pubic root, acetabulum and pubic body compatible with history of osseous metastatic disease. 5. Small right pleural effusion. 6. Aortic Atherosclerosis (ICD10-I70.0). Electronically Signed   By: Lovena Le M.D.   On: 12/26/2018 19:06   Dg Hip Unilat W Or Wo Pelvis 2-3 Views Right  Result Date: 12/26/2018 CLINICAL DATA:  Fall.  Pain. EXAM: DG HIP (WITH OR WITHOUT PELVIS) 2-3V RIGHT COMPARISON:  Today's CT, dictated separately. FINDINGS: No acute fracture or dislocation.  Sacroiliac joints are symmetric. Sclerosis involving the left inferior pubic ramus was present back on the CT of 05/18/2017, suspicious for metastatic disease. IMPRESSION: No acute osseous abnormality. Electronically Signed   By: Abigail Miyamoto M.D.   On: 12/26/2018 18:55    Scheduled Meds: . acidophilus  1 capsule Oral Daily  . apixaban  2.5 mg Oral BID  . atorvastatin  20 mg Oral q1800  . buPROPion  150 mg Oral Daily  . cholecalciferol  2,000 Units Oral Daily  . dofetilide  125 mcg Oral BID  . fluticasone  2 spray Each Nare Daily  . fluticasone  1 puff Inhalation BID  . ipratropium-albuterol  3 mL Nebulization Q6H  . pantoprazole  40 mg Oral Daily  . sodium chloride flush  3 mL Intravenous Q12H   Continuous Infusions: . sodium chloride 100 mL/hr at 12/27/18 0738  . sodium chloride    . cefTRIAXone (ROCEPHIN)  IV      Principal Problem:   Sepsis ([place]) Active  Problems:   GERD   Paroxysmal atrial fibrillation ([facility])   Acute lower UTI   Congestive heart failure with left ventricular diastolic dysfunction ([place])   Acute kidney injury superimposed on CKD ([place])   Bacteremia   Pyelonephritis   Metabolic acidosis   Anxiety state   Essential hypertension   CAD S/P OM PCI 2010   Hyperlipidemia   Mild persistent asthma without complication   Abnormality of gait   Abnormal x-ray of pelvis 11/17    Time spent: 45 minutes    [place] NP  [facility]  If 7PM-7AM, please contact night-coverage at [email or web address], password [facility] 12/27/2018, 12:16 PM  LOS: 1 day

## 2018-12-27 NOTE — Progress Notes (Signed)
NEW ADMISSION NOTE New Admission Note:   Arrival Method: Bed Mental Orientation: A&O X4 Telemetry: 5M13 Assessment: Completed Skin: See Flowsheets IV: WDL Pain: Denies Safety Measures: Safety Fall Prevention Plan has been given, discussed and signed Admission: Completed [facility] [place] Orientation: Patient has been orientated to the room, unit and staff.   Orders have been reviewed and implemented. Will continue to monitor the patient. Call light has been placed within reach and bed alarm has been activated.   Aneta Mins BSN, RN3

## 2018-12-28 DIAGNOSIS — A4151 Sepsis due to Escherichia coli [E. coli]: Principal | ICD-10-CM

## 2018-12-28 DIAGNOSIS — R6520 Severe sepsis without septic shock: Secondary | ICD-10-CM

## 2018-12-28 LAB — CBC WITH DIFFERENTIAL/PLATELET
Abs Immature Granulocytes: 0.04 10*3/uL (ref 0.00–0.07)
Basophils Absolute: 0 10*3/uL (ref 0.0–0.1)
Basophils Relative: 0 %
Eosinophils Absolute: 0 10*3/uL (ref 0.0–0.5)
Eosinophils Relative: 0 %
HCT: 32 % — ABNORMAL LOW (ref 39.0–52.0)
Hemoglobin: 10.5 g/dL — ABNORMAL LOW (ref 13.0–17.0)
Immature Granulocytes: 0 %
Lymphocytes Relative: 2 %
Lymphs Abs: 0.2 10*3/uL — ABNORMAL LOW (ref 0.7–4.0)
MCH: 29.8 pg (ref 26.0–34.0)
MCHC: 32.8 g/dL (ref 30.0–36.0)
MCV: 90.9 fL (ref 80.0–100.0)
Monocytes Absolute: 0.7 10*3/uL (ref 0.1–1.0)
Monocytes Relative: 7 %
Neutro Abs: 8.4 10*3/uL — ABNORMAL HIGH (ref 1.7–7.7)
Neutrophils Relative %: 91 %
Platelets: 60 10*3/uL — ABNORMAL LOW (ref 150–400)
RBC: 3.52 MIL/uL — ABNORMAL LOW (ref 4.22–5.81)
RDW: 14.2 % (ref 11.5–15.5)
WBC: 9.4 10*3/uL (ref 4.0–10.5)
nRBC: 0 % (ref 0.0–0.2)

## 2018-12-28 LAB — COMPREHENSIVE METABOLIC PANEL
ALT: 36 U/L (ref 0–44)
AST: 47 U/L — ABNORMAL HIGH (ref 15–41)
Albumin: 2.1 g/dL — ABNORMAL LOW (ref 3.5–5.0)
Alkaline Phosphatase: 66 U/L (ref 38–126)
Anion gap: 9 (ref 5–15)
BUN: 52 mg/dL — ABNORMAL HIGH (ref 8–23)
CO2: 18 mmol/L — ABNORMAL LOW (ref 22–32)
Calcium: 7.9 mg/dL — ABNORMAL LOW (ref 8.9–10.3)
Chloride: 107 mmol/L (ref 98–111)
Creatinine, Ser: 1.71 mg/dL — ABNORMAL HIGH (ref 0.61–1.24)
GFR calc Af Amer: 41 mL/min — ABNORMAL LOW (ref >60)
GFR calc non Af Amer: 35 mL/min — ABNORMAL LOW (ref >60)
Glucose, Bld: 137 mg/dL — ABNORMAL HIGH (ref 70–99)
Potassium: 3.8 mmol/L (ref 3.5–5.1)
Sodium: 134 mmol/L — ABNORMAL LOW (ref 135–145)
Total Bilirubin: 0.6 mg/dL (ref 0.3–1.2)
Total Protein: 4.9 g/dL — ABNORMAL LOW (ref 6.5–8.1)

## 2018-12-28 LAB — URINE CULTURE: Culture: >=100000 — AB

## 2018-12-28 LAB — MAGNESIUM: Magnesium: 2 mg/dL (ref 1.7–2.4)

## 2018-12-28 MED ORDER — POTASSIUM CHLORIDE CRYS ER 20 MEQ PO TBCR
20.0000 meq | EXTENDED_RELEASE_TABLET | Freq: Once | ORAL | Status: AC
  Administered 2018-12-28: 20 meq via ORAL
  Filled 2018-12-28: qty 1

## 2018-12-28 MED ORDER — ATENOLOL 50 MG PO TABS
25.0000 mg | ORAL_TABLET | Freq: Every day | ORAL | Status: DC
  Administered 2018-12-28 – 2018-12-31 (×4): 25 mg via ORAL
  Filled 2018-12-28 (×4): qty 1

## 2018-12-28 NOTE — TOC Initial Note (Signed)
Transition of Care [facility]) - Initial/Assessment Note    Patient Details  Name: [name] LIMES, MD MRN: [id number] Date of Birth: [birth year]  Transition of Care [facility]) CM/SW Contact:    Bartholomew Crews, RN Phone Number: [phone] 12/28/2018, 9:53 AM  Clinical Narrative:                 Spoke with patient at the bedside. PTA home with spouse. Has a walker at home. Home has raised toilets and shower is walk in with a seat and plenty of grab bars. No problems with medications, shopping, and food prep. Discussed recommendations for [facility] PT. Patient has used [facility] in the past and would like to do so again. Referral accepted by [facility] for PT. Patient will need Home Health orders for PT with face to face.  Patient states that he has transportation home when discharged. TOC team following for transitions.   Expected Discharge Plan: [place] Barriers to Discharge: Continued Medical Work up   Patient Goals and CMS Choice Patient states their goals for this hospitalization and ongoing recovery are:: return home with spouse CMS Medicare.gov Compare Post Acute Care list provided to:: Patient Choice offered to / list presented to : Patient  Expected Discharge Plan and Services Expected Discharge Plan: [place] In-house Referral: NA Discharge Planning Services: CM Consult Post Acute Care Choice: [place] arrangements for the past 2 months: Single Family Home                 DME Arranged: N/A DME Agency: NA       HH Arranged: PT HH Agency: [facility] Date [place]: 12/28/18 Time HH Agency Contacted: [phone] Representative spoke with at [place]: [place]  Prior Living Arrangements/Services Living arrangements for the past 2 months: [place] with:: Self, Spouse Patient language and need for interpreter reviewed:: Yes Do you feel safe going back to the place where you live?: Yes      Need for Family  Participation in Patient Care: Yes (Comment) Care giver support system in place?: Yes (comment) Current home services: DME Criminal Activity/Legal Involvement Pertinent to Current Situation/Hospitalization: No - Comment as needed  Activities of Daily Living Home Assistive Devices/Equipment: Hearing aid ADL Screening (condition at time of admission) Patient's cognitive ability adequate to safely complete daily activities?: No Is the patient deaf or have difficulty hearing?: Yes Does the patient have difficulty seeing, even when wearing glasses/contacts?: No Does the patient have difficulty concentrating, remembering, or making decisions?: No Patient able to express need for assistance with ADLs?: Yes Does the patient have difficulty dressing or bathing?: No Independently performs ADLs?: Yes (appropriate for developmental age) Does the patient have difficulty walking or climbing stairs?: Yes Weakness of Legs: Both Weakness of Arms/Hands: None  Permission Sought/Granted   Permission granted to share information with : Yes, Release of Information Signed  Share Information with NAME: Tywayne Shallenberger     Permission granted to share info w Relationship: spouse  Permission granted to share info w Contact Information: [phone]  Emotional Assessment Appearance:: Appears stated age Attitude/Demeanor/Rapport: Engaged Affect (typically observed): Accepting Orientation: : Oriented to Self, Oriented to Place, Oriented to  Time, Oriented to Situation Alcohol / Substance Use: Not Applicable Psych Involvement: No (comment)  Admission diagnosis:  Pyelonephritis [N12] Severe sepsis with acute organ dysfunction ([place]) [A41.9, R65.20] Patient Active Problem List   Diagnosis Date Noted  . Bacteremia 12/27/2018  .  Abnormal x-ray of pelvis 11/17 12/27/2018  . Pyelonephritis 12/27/2018  . Metabolic acidosis [id number]  . Acute lower UTI 12/26/2018  . Congestive heart failure with left ventricular  diastolic dysfunction ([place]) 12/26/2018  . Sepsis ([place]) 12/26/2018  . Acute kidney injury superimposed on CKD ([place]) 12/26/2018  . Lobar pneumonia (HCC)-left lower lobs  02/26/2018  . Influenza B 02/26/2018  . Severe sepsis ([place]) 02/25/2018  . BPH associated with nocturia 11/21/2017  . Arthritis of carpometacarpal [facility]) joint of right thumb 07/06/2016  . Right foot pain 02/11/2016  . Abnormality of gait 11/27/2015  . Pain in joint, ankle and foot 11/27/2015  . History of asthma 10/23/2015  . Chronic diastolic heart failure ([place])   . Acute hemorrhagic infarction of brain ([place])   . Hypersensitivity reaction 04/01/2015  . Persistent atrial fibrillation 04/01/2015  . ICH (intracerebral hemorrhage) ([place])   . TIA (transient ischemic attack) 03/31/2015  . Visit for monitoring Tikosyn therapy 03/11/2015  . Paroxysmal atrial fibrillation (HCC)   . Pulmonary hypertension ([place]) 09/26/2014  . Bradycardia 12/21/2012  . Diastolic dysfunction [id number]  . CAD S/P OM PCI 2010 12/16/2008  . Hyperlipidemia 06/20/2007  . Anxiety state 06/20/2007  . Essential hypertension 06/20/2007  . Allergic rhinitis 06/20/2007  . Mild persistent asthma without complication 0000000  . GERD 06/20/2007   PCP:  [name], MD Pharmacy:   [facility] [place], [place] - [address] [place] [address] Phone: [phone] Fax: [phone]     Social Determinants of Health (SDOH) Interventions    Readmission Risk Interventions No flowsheet data found.

## 2018-12-28 NOTE — Progress Notes (Addendum)
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  Patient states that he had a possible Covid exposure about a week ago when he was playing golf with a friend.  The whole week he was tired and weak. Today he was weak and slid down from his chair to the floor.  He was not not able to get up.  He came to emergency room when he was found to be hypotensive in the 70s per EMS. Covid test resulted negative. In the emergency room, he was found hypotensive with sepsis criteria secondary to UTI, CT abdomen and pelvis show perinephritic stranding both kidney more on the right. Lactic acid 3.1. Receive IV fluids per sepsis protocol and was started on cefepime and vancomycin.  New events last 24 hours / Subjective: Feeling a little tired but well. Has some congestion with postnasal drip.   Assessment & Plan:   Principal Problem:   Sepsis ([place]) Active Problems:   Hyperlipidemia   Anxiety state   Essential hypertension   CAD S/P OM PCI 2010   Mild persistent asthma without complication   GERD   Paroxysmal atrial fibrillation (HCC)   Abnormality of gait   Acute lower UTI   Congestive heart failure with left ventricular diastolic dysfunction (HCC)   Acute kidney injury superimposed on CKD ([place])   Bacteremia   Abnormal x-ray of pelvis 11/17   Pyelonephritis   Metabolic acidosis    Severe sepsis secondary to E Coli UTI, pyelonephritis, bacteremia -Renal CT with bladder wall thickening and probable outlet obstruction due to prostatomegaly -Blood culture obtained 11/17 revealed E. coli susceptibility  to follow -Urine culture obtained 11/17 revealed E. coli susceptibility to follow -Continue Rocephin -WBC normalized  Acute kidney injury superimposed CKD II -Continue IV fluid -Creatinine continues to improve, monitor BMP   Abnormal xray of pelvis -Hx recent fall. Pelvic xray with small osseous fragment seen adjacent to the inferior right femoral head which could represent a small chip fracture -PT evaluation commending home health  Paroxysmal Afib -Resume home Eliquis, atenolol, Cardizem, Tikosyn  -Continue telemetry  Chronic diastolic heart failure -Echo 09/2017 with EF 60% and grade 1 diastolic dysfunction -Wife reports lasix stopped last week as he was "so weak" -Without exacerbation currently  Mild persistent asthma without complication -Continue duo nebs, flutter valve   Anxiety -Continue wellbutrin   CAD s/p PCI 2010 -No chest pain -Continue lipitor   Hypokalemia -Replace.  Check mag    DVT prophylaxis: Eliquis  Code Status: Full Family Communication: None at bedside. Left a voicemail for wife to call us with questions.  Disposition Plan: Pending clinical improvement. Hopeful discharge home with home health in 1-2 days.    Consultants:   None  Procedures:   None   Antimicrobials:  Anti-infectives (From admission, onward)   Start     Dose/Rate Route Frequency Ordered Stop   12/27/18 1630  ceFEPIme (MAXIPIME) 2 g in sodium chloride 0.9 % 100 mL IVPB  Status:  Discontinued     2 g 200 mL/hr over 30 Minutes Intravenous Every 24 hours 12/26/18 1724 12/27/18 0758   12/27/18 1600  cefTRIAXone (ROCEPHIN) 2  g in sodium chloride 0.9 % 100 mL IVPB     2 g 200 mL/hr over 30 Minutes Intravenous Every 24 hours 12/27/18 0758     12/26/18 1723  vancomycin variable dose per unstable renal function (pharmacist dosing)  Status:  Discontinued      Does not apply See admin instructions 12/26/18 1724 12/27/18 0737   12/26/18 1615  vancomycin (VANCOCIN) 1,500 mg in  sodium chloride 0.9 % 500 mL IVPB     1,500 mg 250 mL/hr over 120 Minutes Intravenous  Once 12/26/18 1552 12/26/18 1943   12/26/18 1545  ceFEPIme (MAXIPIME) 2 g in sodium chloride 0.9 % 100 mL IVPB     2 g 200 mL/hr over 30 Minutes Intravenous  Once 12/26/18 1540 12/26/18 1704   12/26/18 1545  metroNIDAZOLE (FLAGYL) IVPB 500 mg     500 mg 100 mL/hr over 60 Minutes Intravenous  Once 12/26/18 1540 12/26/18 1809   12/26/18 1545  vancomycin (VANCOCIN) IVPB 1000 mg/200 mL premix  Status:  Discontinued     1,000 mg 200 mL/hr over 60 Minutes Intravenous  Once 12/26/18 1540 12/26/18 1552        Objective: Vitals:   12/27/18 2035 12/28/18 0435 12/28/18 0845 12/28/18 0943  BP: 126/73 124/74  118/75  Pulse: (!) 103 91  93  Resp: 16 15  18   Temp: 98.7 F (37.1 C) 98.7 F (37.1 C)  98.3 F (36.8 C)  TempSrc: Oral   Oral  SpO2: 94% 98% 97% 93%  Weight:      Height:        Intake/Output Summary (Last 24 hours) at 12/28/2018 1115 Last data filed at 12/28/2018 0957 Gross per 24 hour  Intake 2132.5 ml  Output 920 ml  Net 1212.5 ml   Filed Weights   12/26/18 1813  Weight: 72.8 kg    Examination:  General exam: Appears calm and comfortable  Respiratory system: Clear to auscultation. Respiratory effort normal. No respiratory distress. No conversational dyspnea.  Cardiovascular system: S1 & S2 heard, Irreg rhythm rate 100s. No murmurs. No pedal edema. Gastrointestinal system: Abdomen is nondistended, soft and nontender. Normal bowel sounds heard. Central nervous system: Alert and oriented. No focal neurological deficits. Speech clear.  Extremities: Symmetric in appearance  Skin: No rashes, lesions or ulcers on exposed skin  Psychiatry: Judgement and insight appear normal. Mood & affect appropriate.   Data Reviewed: I have personally reviewed following labs and imaging studies  CBC: Recent Labs  Lab 12/26/18 1540 12/27/18 0332 12/28/18 0533  WBC 17.0* 15.2* 9.4  NEUTROABS  14.2* 12.6* 8.4*  HGB 11.3* 11.3* 10.5*  HCT 34.0* 34.7* 32.0*  MCV 91.6 92.3 90.9  PLT 72* 59* 60*   Basic Metabolic Panel: Recent Labs  Lab 12/22/18 1048 12/26/18 1540 12/27/18 0332 12/27/18 1535 12/28/18 0533  NA 137 127* 132* 134* 134*  K 4.8 4.7 4.2 4.0 3.8  CL 101 96* 103 103 107  CO2 27 19* 17* 17* 18*  GLUCOSE 112* 131* 99 115* 137*  BUN 26* 67* 66* 61* 52*  CREATININE 1.48* 2.70* 2.16* 1.91* 1.71*  CALCIUM 9.2 7.8* 8.0* 8.2* 7.9*  MG  --  1.7  --   --   --    GFR: Estimated Creatinine Clearance: 30.4 mL/min (A) (by C-G formula based on SCr of 1.71 mg/dL (H)). Liver Function Tests: Recent Labs  Lab 12/26/18 1540 12/27/18 0332 12/28/18 0533  AST 43* 37 47*  ALT 23 24 36  ALKPHOS 68 70  66  BILITOT 1.4* 1.4* 0.6  PROT 5.1* 5.3* 4.9*  ALBUMIN 2.6* 2.5* 2.1*   No results for input(s): LIPASE, AMYLASE in the last 168 hours. No results for input(s): AMMONIA in the last 168 hours. Coagulation Profile: Recent Labs  Lab 12/26/18 1540 12/27/18 0332  INR 2.9* 2.0*   Cardiac Enzymes: No results for input(s): CKTOTAL, CKMB, CKMBINDEX, TROPONINI in the last 168 hours. BNP (last 3 results) No results for input(s): PROBNP in the last 8760 hours. HbA1C: No results for input(s): HGBA1C in the last 72 hours. CBG: No results for input(s): GLUCAP in the last 168 hours. Lipid Profile: No results for input(s): CHOL, HDL, LDLCALC, TRIG, CHOLHDL, LDLDIRECT in the last 72 hours. Thyroid Function Tests: No results for input(s): TSH, T4TOTAL, FREET4, T3FREE, THYROIDAB in the last 72 hours. Anemia Panel: No results for input(s): VITAMINB12, FOLATE, FERRITIN, TIBC, IRON, RETICCTPCT in the last 72 hours. Sepsis Labs: Recent Labs  Lab 12/26/18 1540 12/26/18 2000 12/26/18 2229 12/27/18 0129 12/27/18 0332  PROCALCITON  --   --   --   --  8.88  LATICACIDVEN 3.1* 1.8 2.2* 1.4  --     Recent Results (from the past 240 hour(s))  Novel Coronavirus, NAA ([facility])     Status:  None   Collection Time: 12/25/18 12:00 AM   Specimen: Nasopharyngeal(NP) swabs in vial transport medium   NASOPHARYNGE  TESTING  Result Value Ref Range Status   SARS-CoV-2, NAA Not Detected Not Detected Final    Comment: This nucleic acid amplification test was developed and its performance characteristics determined by [employer]. Nucleic acid amplification tests include PCR and TMA. This test has not been FDA cleared or approved. This test has been authorized by FDA under an Emergency Use Authorization (EUA). This test is only authorized for the duration of time the declaration that circumstances exist justifying the authorization of the emergency use of in vitro diagnostic tests for detection of SARS-CoV-2 virus and/or diagnosis of COVID-19 infection under section 564(b)(1) of the Act, 21 U.S.C. [id number]) (1), unless the authorization is terminated or revoked sooner. When diagnostic testing is negative, the possibility of a false negative result should be considered in the context of a patient's recent exposures and the presence of clinical signs and symptoms consistent with COVID-19. An individual without symptoms of COVID-19 and who is not shedding SARS-CoV-2 virus would  expect to have a negative (not detected) result in this assay.   Blood Culture (routine x 2)     Status: None (Preliminary result)   Collection Time: 12/26/18  4:03 PM   Specimen: BLOOD  Result Value Ref Range Status   Specimen Description BLOOD LEFT ANTECUBITAL  Final   Special Requests   Final    BOTTLES DRAWN AEROBIC AND ANAEROBIC Blood Culture results may not be optimal due to an inadequate volume of blood received in culture bottles   Culture  Setup Time   Final    IN BOTH AEROBIC AND ANAEROBIC BOTTLES GRAM NEGATIVE RODS CRITICAL VALUE NOTED.  VALUE IS CONSISTENT WITH PREVIOUSLY REPORTED AND CALLED VALUE.    Culture   Final    GRAM NEGATIVE RODS IDENTIFICATION AND SUSCEPTIBILITIES TO  FOLLOW Performed at [facility], [place] [address] [place], [place] [address]    Report Status PENDING  Incomplete  Blood Culture (routine x 2)     Status: Abnormal (Preliminary result)   Collection Time: 12/26/18  4:11 PM   Specimen: BLOOD RIGHT ARM  Result Value Ref  Range Status   Specimen Description BLOOD RIGHT ARM  Final   Special Requests   Final    BOTTLES DRAWN AEROBIC AND ANAEROBIC Blood Culture adequate volume   Culture  Setup Time   Final    IN BOTH AEROBIC AND ANAEROBIC BOTTLES GRAM NEGATIVE RODS CRITICAL RESULT CALLED TO, READ BACK BY AND VERIFIED WITH: C AMEND PHARMD 12/27/18 0625 JDW    Culture (A)  Final    ESCHERICHIA COLI SUSCEPTIBILITIES TO FOLLOW Performed at [facility], [place] [address], [place], [place] [address]    Report Status PENDING  Incomplete  Blood Culture ID Panel (Reflexed)     Status: Abnormal   Collection Time: 12/26/18  4:11 PM  Result Value Ref Range Status   Enterococcus species NOT DETECTED NOT DETECTED Final   Listeria monocytogenes NOT DETECTED NOT DETECTED Final   Staphylococcus species NOT DETECTED NOT DETECTED Final   Staphylococcus aureus (BCID) NOT DETECTED NOT DETECTED Final   Streptococcus species NOT DETECTED NOT DETECTED Final   Streptococcus agalactiae NOT DETECTED NOT DETECTED Final   Streptococcus pneumoniae NOT DETECTED NOT DETECTED Final   Streptococcus pyogenes NOT DETECTED NOT DETECTED Final   Acinetobacter baumannii NOT DETECTED NOT DETECTED Final   Enterobacteriaceae species DETECTED (A) NOT DETECTED Final    Comment: Enterobacteriaceae represent a large family of gram-negative bacteria, not a single organism. CRITICAL RESULT CALLED TO, READ BACK BY AND VERIFIED WITH: C AMEND PHARMD 12/27/18 625 JDW    Enterobacter cloacae complex NOT DETECTED NOT DETECTED Final   Escherichia coli DETECTED (A) NOT DETECTED Final    Comment: CRITICAL RESULT CALLED TO, READ BACK BY AND VERIFIED WITH: C AMEND PHARMD 12/27/18  0625 JDW    Klebsiella oxytoca NOT DETECTED NOT DETECTED Final   Klebsiella pneumoniae NOT DETECTED NOT DETECTED Final   Proteus species NOT DETECTED NOT DETECTED Final   Serratia marcescens NOT DETECTED NOT DETECTED Final   Carbapenem resistance NOT DETECTED NOT DETECTED Final   Haemophilus influenzae NOT DETECTED NOT DETECTED Final   Neisseria meningitidis NOT DETECTED NOT DETECTED Final   Pseudomonas aeruginosa NOT DETECTED NOT DETECTED Final   Candida albicans NOT DETECTED NOT DETECTED Final   Candida glabrata NOT DETECTED NOT DETECTED Final   Candida krusei NOT DETECTED NOT DETECTED Final   Candida parapsilosis NOT DETECTED NOT DETECTED Final   Candida tropicalis NOT DETECTED NOT DETECTED Final    Comment: Performed at [facility], [place] [address], [place], [place] [address]  Urine culture     Status: Abnormal (Preliminary result)   Collection Time: 12/26/18  4:17 PM   Specimen: In/Out Cath Urine  Result Value Ref Range Status   Specimen Description IN/OUT CATH URINE  Final   Special Requests NONE  Final   Culture (A)  Final    >=100,000 COLONIES/mL ESCHERICHIA COLI SUSCEPTIBILITIES TO FOLLOW Performed at [facility], [place] [address], [place], [place] [address]    Report Status PENDING  Incomplete  SARS CORONAVIRUS 2 (TAT 6-24 HRS) Nasopharyngeal Nasopharyngeal Swab     Status: None   Collection Time: 12/26/18  4:35 PM   Specimen: Nasopharyngeal Swab  Result Value Ref Range Status   SARS Coronavirus 2 NEGATIVE NEGATIVE Final    Comment: (NOTE) SARS-CoV-2 target nucleic acids are NOT DETECTED. The SARS-CoV-2 RNA is generally detectable in upper and lower respiratory specimens during the acute phase of infection. Negative results do not preclude SARS-CoV-2 infection, do not rule out co-infections with other pathogens,  and should not be used as the sole basis for treatment or other patient management decisions. Negative results must be combined with clinical  observations, patient history, and epidemiological information. The expected result is Negative. Fact Sheet for Patients: SugarRoll.be Fact Sheet for Healthcare Providers: https://www.woods-mathews.com/ This test is not yet approved or cleared by the [place] FDA and  has been authorized for detection and/or diagnosis of SARS-CoV-2 by FDA under an Emergency Use Authorization (EUA). This EUA will remain  in effect (meaning this test can be used) for the duration of the COVID-19 declaration under Section 56 4(b)(1) of the Act, 21 U.S.C. section 360bbb-3(b)(1), unless the authorization is terminated or revoked sooner. Performed at [facility], [place] [address] [place], [place] [address]       Radiology Studies: Ct Head Wo Contrast  Result Date: 12/26/2018 CLINICAL DATA:  Head trauma syncopal episode EXAM: CT HEAD WITHOUT CONTRAST CT CERVICAL SPINE WITHOUT CONTRAST TECHNIQUE: Multidetector CT imaging of the head and cervical spine was performed following the standard protocol without intravenous contrast. Multiplanar CT image reconstructions of the cervical spine were also generated. COMPARISON:  CT brain and cervical spine 03/20/2018 FINDINGS: CT HEAD FINDINGS Brain: No acute territorial infarction, hemorrhage, or intracranial mass is seen. Similar appearance of encephalomalacia/chronic infarcts within the right frontal and parietal lobes. Atrophy. Mild small vessel ischemic changes of the white matter. Stable ventricle size Vascular: No hyperdense vessel. Vertebral and carotid vascular calcification Skull: No fracture.  Right frontal osteoma Sinuses/Orbits: Interval extensive opacification of the ethmoid and maxillary sinuses with fluid levels in the sphenoid sinuses. Other: None CT CERVICAL SPINE FINDINGS Alignment: Trace anterolisthesis C4 on C5 as before. Facet alignment normal Skull base and vertebrae: No acute fracture. No primary bone  lesion or focal pathologic process. Soft tissues and spinal canal: No prevertebral fluid or swelling. No visible canal hematoma. Disc levels: Moderate degenerative changes C5-C6 and C6-C7. Multiple level facet degenerative change. Upper chest: Negative. Other: None IMPRESSION: 1. No CT evidence for acute intracranial abnormality. Atrophy and chronic infarct in the right frontal and parietal lobes. Mild small vessel ischemic changes of the white matter 2. No acute osseous abnormality of the cervical spine 3. Pansinusitis Electronically Signed   By: Donavan Foil M.D.   On: 12/26/2018 18:58   Ct Cervical Spine Wo Contrast  Result Date: 12/26/2018 CLINICAL DATA:  Head trauma syncopal episode EXAM: CT HEAD WITHOUT CONTRAST CT CERVICAL SPINE WITHOUT CONTRAST TECHNIQUE: Multidetector CT imaging of the head and cervical spine was performed following the standard protocol without intravenous contrast. Multiplanar CT image reconstructions of the cervical spine were also generated. COMPARISON:  CT brain and cervical spine 03/20/2018 FINDINGS: CT HEAD FINDINGS Brain: No acute territorial infarction, hemorrhage, or intracranial mass is seen. Similar appearance of encephalomalacia/chronic infarcts within the right frontal and parietal lobes. Atrophy. Mild small vessel ischemic changes of the white matter. Stable ventricle size Vascular: No hyperdense vessel. Vertebral and carotid vascular calcification Skull: No fracture.  Right frontal osteoma Sinuses/Orbits: Interval extensive opacification of the ethmoid and maxillary sinuses with fluid levels in the sphenoid sinuses. Other: None CT CERVICAL SPINE FINDINGS Alignment: Trace anterolisthesis C4 on C5 as before. Facet alignment normal Skull base and vertebrae: No acute fracture. No primary bone lesion or focal pathologic process. Soft tissues and spinal canal: No prevertebral fluid or swelling. No visible canal hematoma. Disc levels: Moderate degenerative changes C5-C6 and  C6-C7. Multiple level facet degenerative change. Upper chest: Negative. Other: None IMPRESSION: 1. No  CT evidence for acute intracranial abnormality. Atrophy and chronic infarct in the right frontal and parietal lobes. Mild small vessel ischemic changes of the white matter 2. No acute osseous abnormality of the cervical spine 3. Pansinusitis Electronically Signed   By: Donavan Foil M.D.   On: 12/26/2018 18:58   Dg Pelvis Portable  Result Date: 12/26/2018 CLINICAL DATA:  Recent fall EXAM: PORTABLE PELVIS 1-2 VIEWS COMPARISON:  May 18, 2017 FINDINGS: Small osseous fragment seen inferior to the right femoral head which could represent a small chip fracture. there is sclerotic slightly expansile osseous lesion again noted within the left inferior pubic rami, and ilium. IMPRESSION: 1. Small osseous fragment seen adjacent to the inferior right femoral head which could represent a small chip fracture. If further evaluation is required, would recommend cross-sectional imaging. 2. Sclerotic osseous lesion within the left pelvis as on the prior CT May 18, 2017. Electronically Signed   By: Prudencio Pair M.D.   On: 12/26/2018 16:11   Dg Chest Port 1 View  Result Date: 12/26/2018 CLINICAL DATA:  Weakness and hypertension EXAM: PORTABLE CHEST 1 VIEW COMPARISON:  February 25, 2018 FINDINGS: There is mild cardiomegaly. Aortic knob calcifications. Both lungs are clear. The visualized skeletal structures are unremarkable. Surgical clips seen at the GE junction. IMPRESSION: No acute cardiopulmonary process. Electronically Signed   By: Prudencio Pair M.D.   On: 12/26/2018 16:08   Ct Renal Stone Study  Result Date: 12/26/2018 CLINICAL DATA:  Flank pain, recurrent stone disease suspected, suspected renal failure EXAM: CT ABDOMEN AND PELVIS WITHOUT CONTRAST TECHNIQUE: Multidetector CT imaging of the abdomen and pelvis was performed following the standard protocol without IV contrast. COMPARISON:  CT 05/18/2017, MRI  02/09/2015 FINDINGS: Lower chest: Bandlike areas of opacity in the lung bases may reflect atelectasis and/or scarring. Trace right pleural effusion. Bilateral subpleural fat. Coronary artery calcifications are present as are calcifications of the mitral valve chordae tendineae and mitral annulus. Cardiac size at the upper limits of normal. Trace pericardial fluid within physiologic normal. Few pericardial calcifications may reflect sequela of prior pericarditis. Hepatobiliary: Stable 1 cm hypoattenuating cyst in the anterior left lobe liver (3/15). No concerning liver abnormality is seen. Patient is post cholecystectomy. Slight prominence of the biliary tree likely related to reservoir effect. No calcified intraductal gallstones. Pancreas: Unremarkable. No pancreatic ductal dilatation or surrounding inflammatory changes. Spleen: Normal in size without focal abnormality. Adrenals/Urinary Tract: Normal adrenal glands. Mild interval increase in the amount of bilateral perinephric stranding most notably in the right perirenal space. Redemonstration of the complex cystic lesion in the interpolar region left kidney with extension into the renal sinus. This lesion again measures 8.9 x 4.8 x 6.4 cm in size, not significantly changed from comparison study when measured at a similar level. Complete characterization of this lesion is precluded in the absence of contrast media however the degree of septation is increased from comparison MRI with with calcified septations identified on today's study. No obstructing urolithiasis. Circumferential bladder wall thickening is noted with faint perivesicular haze. Indentation of the bladder base by the enlarged prostate Stomach/Bowel: Postsurgical changes at the diaphragmatic hiatus possibly reflecting a prior fundoplication. Stomach is otherwise unremarkable. No small bowel wall thickening or dilatation. No evidence of obstruction. A normal appendix is visualized. Portion of the colon  protrudes into a left inguinal hernia without resulting obstruction at this time. Scattered colonic diverticula without focal pericolonic inflammation to suggest diverticulitis. Vascular/Lymphatic: Atherosclerotic plaque within the normal caliber aorta. No suspicious or enlarged  lymph nodes in the included lymphatic chains. Reproductive: Moderate prostatomegaly. Mass effect on the bladder base. Other: Stable appearance of the left inguinal hernia containing portion of the sigmoid colon. Fat containing right inguinal hernia is present as well. No other bowel containing hernias. No free air or free fluid in the peritoneum. Right retroperitoneal free fluid tracking inferiorly possibly reactive and related to a right renal process. Musculoskeletal: Multilevel degenerative changes are present in the imaged portions of the spine. No acute osseous abnormality or suspicious osseous lesion. Stable sclerotic lesions in L2 and the right ilium as well as involving the left acetabulum and inferior pubic ramus and left pubic body. Multiple Schmorl's node formations. IMPRESSION: 1. Increasing perinephric stranding particularly of the right kidney with reactive free fluid in the right posterior pararenal space. Given circumferential bladder wall thickening and probable outlet obstruction due to prostatomegaly, recommend correlation with urinalysis to exclude a cystitis and ascending urinary tract infection. Recently passed calculus could have a similar appearance. 2. Redemonstration of the complex cystic lesion in the interpolar region of the left kidney with extension into the renal sinus, which again measures up to 8.9 cm in size, not significantly changed in size from comparison study when measured at a similar level when measured at a similar level. Complete characterization of this lesion is precluded on this non-contrast CT exam. 3. Stable appearance of the left inguinal hernia containing portion of the sigmoid colon. No  evidence of bowel obstruction at this time. 4. Multiple sclerotic foci involving L2, the right ilium and the left pubic root, acetabulum and pubic body compatible with history of osseous metastatic disease. 5. Small right pleural effusion. 6. Aortic Atherosclerosis (ICD10-I70.0). Electronically Signed   By: Lovena Le M.D.   On: 12/26/2018 19:06   Dg Hip Unilat W Or Wo Pelvis 2-3 Views Right  Result Date: 12/26/2018 CLINICAL DATA:  Fall.  Pain. EXAM: DG HIP (WITH OR WITHOUT PELVIS) 2-3V RIGHT COMPARISON:  Today's CT, dictated separately. FINDINGS: No acute fracture or dislocation.  Sacroiliac joints are symmetric. Sclerosis involving the left inferior pubic ramus was present back on the CT of 05/18/2017, suspicious for metastatic disease. IMPRESSION: No acute osseous abnormality. Electronically Signed   By: Abigail Miyamoto M.D.   On: 12/26/2018 18:55      Scheduled Meds:  acidophilus  1 capsule Oral Daily   apixaban  2.5 mg Oral BID   atorvastatin  20 mg Oral q1800   buPROPion  150 mg Oral Daily   cholecalciferol  2,000 Units Oral Daily   dofetilide  125 mcg Oral BID   fluticasone  2 spray Each Nare Daily   fluticasone  1 puff Inhalation BID   pantoprazole  40 mg Oral Daily   sodium chloride flush  3 mL Intravenous Q12H   Continuous Infusions:  sodium chloride 75 mL/hr at 12/28/18 1101   sodium chloride     cefTRIAXone (ROCEPHIN)  IV 2 g (12/27/18 1512)     LOS: 2 days      Time spent: 40 minutes   Dessa Phi, DO [facility] 12/28/2018, 11:15 AM   Available via Epic secure chat 7am-7pm After these hours, please refer to coverage provider listed on [email or web address]

## 2018-12-28 NOTE — Progress Notes (Signed)
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  He was found to be hypotensive in the 70s    PT Comments    Patient received in bed, very pleasant and cooperative with PT today. Took orthostatics this session with values as below, note session limited due to significant orthostatic drop. Able to complete functional bed mobility with S, functional transfers with min guard and RW, and gait approximately 58ft to chair with RW, min guard and cues for obstacle navigation/problem solving in small room. He was left up in the chair with alarm set and BP 129/73, HR varying from 111-123 at rest. RN notified via epic secure messenger regarding BP/HR limitations of session.    Orthostatics:  Supine BP 120/79 Sitting BP 101/70 Standing 90/60 Standing x3 minutes 79/57 HR fluctuating between 90-123 during session   Follow Up Recommendations  Home health PT;Supervision/Assistance - 24 hour     Equipment Recommendations  None recommended by PT    Recommendations for Other Services       Precautions / Restrictions Precautions Precautions: Fall Precaution Comments: watch BP Restrictions Weight Bearing Restrictions: No    Mobility  Bed Mobility Overal bed mobility: Needs Assistance Bed Mobility: Supine to Sit     Supine to sit: Supervision     General bed mobility comments: S for safety/line management, no other physical assistance  Transfers Overall transfer level: Needs assistance Equipment used: Rolling walker (2 wheeled) Transfers: Sit to/from Stand Sit to  Stand: Min guard         General transfer comment: min guard for safety, cues for hand placement  Ambulation/Gait Ambulation/Gait assistance: Min guard Gait Distance (Feet): 15 Feet Assistive device: Rolling walker (2 wheeled) Gait Pattern/deviations: Shuffle;Decreased stride length;Trunk flexed;Step-through pattern Gait velocity: decreased   General Gait Details: limited gait due to significant orthostatic BP drop with position transitions, min guard for gait in room and cues for obstacle navigation in tight space   Stairs             Wheelchair Mobility    Modified Rankin (Stroke Patients Only)       Balance Overall balance assessment: Needs assistance Sitting-balance support: Feet supported;Bilateral upper extremity supported Sitting balance-Leahy Scale: Good     Standing balance support: Bilateral upper extremity supported Standing balance-Leahy Scale: Fair Standing balance comment: reliant on UE support today                            Cognition Arousal/Alertness: Awake/alert Behavior During Therapy: WFL for tasks assessed/performed Overall Cognitive Status: Within Functional Limits for tasks assessed                                 General Comments: patient now with hearing aides, very pleasant and cooperative      Exercises      General Comments General comments (skin integrity, edema, etc.): ongoing asymptomatic orthostatics (see vitals section, RN notified via Epic secure messenger)      Pertinent Vitals/Pain Pain Assessment: No/denies pain    Home Living  Prior Function            PT Goals (current goals can now be found in the care plan section) Acute Rehab PT Goals Patient Stated Goal: to return home PT Goal Formulation: With patient/family Time For Goal Achievement: 01/10/19 Potential to Achieve Goals: Good Progress towards PT goals: Not progressing toward goals -  comment(limited by orthostatics)    Frequency    Min 3X/week      PT Plan      Co-evaluation              AM-PAC PT "6 Clicks" Mobility   Outcome Measure  Help needed turning from your back to your side while in a flat bed without using bedrails?: None Help needed moving from lying on your back to sitting on the side of a flat bed without using bedrails?: None Help needed moving to and from a bed to a chair (including a wheelchair)?: None Help needed standing up from a chair using your arms (e.g., wheelchair or bedside chair)?: A Little Help needed to walk in hospital room?: A Little Help needed climbing 3-5 steps with a railing? : A Little 6 Click Score: 21    End of Session   Activity Tolerance: Treatment limited secondary to medical complications (Comment)(limited session due to significant orthostatics) Patient left: in chair;with call bell/phone within reach;with chair alarm set Nurse Communication: Other (comment)(orthostatics) PT Visit Diagnosis: Other abnormalities of gait and mobility (R26.89);Muscle weakness (generalized) (M62.81)     Time: [id number] PT Time Calculation (min) (ACUTE ONLY): 35 min  Charges:  $Gait Training: 8-22 mins $Therapeutic Activity: 8-22 mins                     Windell Norfolk, DPT, PN1   Supplemental Physical Therapist [facility]    Pager [phone] Acute Rehab Office [phone]

## 2018-12-28 NOTE — Discharge Instructions (Addendum)

## 2018-12-29 ENCOUNTER — Other Ambulatory Visit (HOSPITAL_COMMUNITY): Payer: PPO

## 2018-12-29 ENCOUNTER — Inpatient Hospital Stay (HOSPITAL_COMMUNITY): Payer: PPO

## 2018-12-29 DIAGNOSIS — N390 Urinary tract infection, site not specified: Secondary | ICD-10-CM

## 2018-12-29 DIAGNOSIS — N179 Acute kidney failure, unspecified: Secondary | ICD-10-CM

## 2018-12-29 DIAGNOSIS — N189 Chronic kidney disease, unspecified: Secondary | ICD-10-CM

## 2018-12-29 LAB — CBC WITH DIFFERENTIAL/PLATELET
Abs Immature Granulocytes: 0.09 10*3/uL — ABNORMAL HIGH (ref 0.00–0.07)
Basophils Absolute: 0 10*3/uL (ref 0.0–0.1)
Basophils Relative: 0 %
Eosinophils Absolute: 0.1 10*3/uL (ref 0.0–0.5)
Eosinophils Relative: 1 %
HCT: 32.9 % — ABNORMAL LOW (ref 39.0–52.0)
Hemoglobin: 10.9 g/dL — ABNORMAL LOW (ref 13.0–17.0)
Immature Granulocytes: 1 %
Lymphocytes Relative: 4 %
Lymphs Abs: 0.3 10*3/uL — ABNORMAL LOW (ref 0.7–4.0)
MCH: 29.9 pg (ref 26.0–34.0)
MCHC: 33.1 g/dL (ref 30.0–36.0)
MCV: 90.4 fL (ref 80.0–100.0)
Monocytes Absolute: 1.1 10*3/uL — ABNORMAL HIGH (ref 0.1–1.0)
Monocytes Relative: 12 %
Neutro Abs: 7.3 10*3/uL (ref 1.7–7.7)
Neutrophils Relative %: 82 %
Platelets: 70 10*3/uL — ABNORMAL LOW (ref 150–400)
RBC: 3.64 MIL/uL — ABNORMAL LOW (ref 4.22–5.81)
RDW: 14.7 % (ref 11.5–15.5)
WBC: 8.9 10*3/uL (ref 4.0–10.5)
nRBC: 0 % (ref 0.0–0.2)

## 2018-12-29 LAB — BASIC METABOLIC PANEL
Anion gap: 10 (ref 5–15)
BUN: 42 mg/dL — ABNORMAL HIGH (ref 8–23)
CO2: 17 mmol/L — ABNORMAL LOW (ref 22–32)
Calcium: 8.1 mg/dL — ABNORMAL LOW (ref 8.9–10.3)
Chloride: 108 mmol/L (ref 98–111)
Creatinine, Ser: 1.48 mg/dL — ABNORMAL HIGH (ref 0.61–1.24)
GFR calc Af Amer: 49 mL/min — ABNORMAL LOW (ref >60)
GFR calc non Af Amer: 42 mL/min — ABNORMAL LOW (ref >60)
Glucose, Bld: 111 mg/dL — ABNORMAL HIGH (ref 70–99)
Potassium: 3.9 mmol/L (ref 3.5–5.1)
Sodium: 135 mmol/L (ref 135–145)

## 2018-12-29 LAB — CULTURE, BLOOD (ROUTINE X 2): Special Requests: ADEQUATE

## 2018-12-29 LAB — BRAIN NATRIURETIC PEPTIDE: B Natriuretic Peptide: 625.3 pg/mL — ABNORMAL HIGH (ref 0.0–100.0)

## 2018-12-29 LAB — MAGNESIUM: Magnesium: 1.8 mg/dL (ref 1.7–2.4)

## 2018-12-29 MED ORDER — MAGNESIUM OXIDE 400 (241.3 MG) MG PO TABS
400.0000 mg | ORAL_TABLET | Freq: Two times a day (BID) | ORAL | Status: AC
  Administered 2018-12-29 (×2): 400 mg via ORAL
  Filled 2018-12-29 (×2): qty 1

## 2018-12-29 MED ORDER — POTASSIUM CHLORIDE CRYS ER 20 MEQ PO TBCR
20.0000 meq | EXTENDED_RELEASE_TABLET | Freq: Once | ORAL | Status: AC
  Administered 2018-12-29: 20 meq via ORAL
  Filled 2018-12-29: qty 1

## 2018-12-29 MED ORDER — IPRATROPIUM-ALBUTEROL 0.5-2.5 (3) MG/3ML IN SOLN
3.0000 mL | Freq: Four times a day (QID) | RESPIRATORY_TRACT | Status: DC
  Administered 2018-12-29 – 2018-12-30 (×4): 3 mL via RESPIRATORY_TRACT
  Filled 2018-12-29 (×5): qty 3

## 2018-12-29 NOTE — Care Management Important Message (Signed)
Important Message  Patient Details  Name: [name] NAPIERALSKI, MD MRN: [id number] Date of Birth: 17-Mar-1929   Medicare Important Message Given:  Yes     [name] 12/29/2018, 2:26 PM

## 2018-12-29 NOTE — Plan of Care (Signed)
  Problem: Health Behavior/Discharge Planning: Goal: Ability to manage health-related needs will improve Outcome: Progressing   

## 2018-12-29 NOTE — Progress Notes (Signed)
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  Patient states that he had a possible Covid exposure about a week ago when he was playing golf with a friend.  The whole week he was tired and weak. Today he was weak and slid down from his chair to the floor.  He was not not able to get up.  He came to emergency room when he was found to be hypotensive in the 70s per EMS. Covid test resulted negative. In the emergency room, he was found hypotensive with sepsis criteria secondary to UTI, CT abdomen and pelvis show perinephritic stranding both kidney more on the right. Lactic acid 3.1. Receive IV fluids per sepsis protocol and was started on cefepime and vancomycin.  New events last 24 hours / Subjective: Doing well this morning, no further diarrhea.  Assessment & Plan:   Principal Problem:   Sepsis ([place]) Active Problems:   Hyperlipidemia   Anxiety state   Essential hypertension   CAD S/P OM PCI 2010   Mild persistent asthma without complication   GERD   Paroxysmal atrial fibrillation (HCC)   Abnormality of gait   Acute lower UTI   Congestive heart failure with left ventricular diastolic dysfunction (HCC)   Acute kidney injury superimposed on CKD ([place])   Bacteremia   Abnormal x-ray of pelvis 11/17   Pyelonephritis   Metabolic acidosis    Severe sepsis secondary to E Coli UTI, pyelonephritis, bacteremia -Renal CT with bladder wall thickening and probable outlet obstruction due to prostatomegaly -Blood culture obtained 11/17 pansensitive E Coli  -Urine culture obtained 11/17  pansensitive E Coli -Continue Rocephin -WBC normalized -Repeat blood culture 11/20 ordered   Acute kidney injury superimposed CKD II -Continue IV fluid -Creatinine continues to improve, monitor BMP   Orthostatic hypotension -120/79 supine --> 79/57 standing at 3 minutes  -Repeat orthostatic VS today. Continue IVF   Abnormal xray of pelvis -Hx recent fall. Pelvic xray with small osseous fragment seen adjacent to the inferior right femoral head which could represent a small chip fracture -PT evaluation commending home health  Paroxysmal Afib -Resume home Eliquis, atenolol, Cardizem, Tikosyn  -Continue telemetry  Chronic diastolic heart failure -Echo 09/2017 with EF 60% and grade 1 diastolic dysfunction -Wife reports lasix stopped last week as he was "so weak" -Without exacerbation currently  Mild persistent asthma without complication -Continue duo nebs, flutter valve   Anxiety -Continue wellbutrin   CAD s/p PCI 2010 -No chest pain -Continue lipitor   Hypokalemia -Replace  Hypomagnesemia -Replace     DVT prophylaxis: Eliquis  Code Status: Full Family Communication: Discussed with wife on speaker phone during exam  Disposition Plan: Pending clinical improvement. Hopeful discharge home with home health in 1-2 days.    Consultants:   None  Procedures:   None   Antimicrobials:  Anti-infectives (From admission, onward)   Start     Dose/Rate Route Frequency Ordered Stop   12/27/18 1630  ceFEPIme (MAXIPIME) 2 g in sodium chloride 0.9 % 100 mL IVPB  Status:  Discontinued     2 g 200 mL/hr over 30 Minutes Intravenous Every 24 hours 12/26/18 1724  12/27/18 0758   12/27/18 1600  cefTRIAXone (ROCEPHIN) 2 g in sodium chloride 0.9 % 100 mL IVPB     2 g 200 mL/hr over 30 Minutes Intravenous Every 24 hours 12/27/18 0758     12/26/18 1723  vancomycin variable dose per unstable renal function (pharmacist dosing)  Status:  Discontinued      Does not apply See admin  instructions 12/26/18 1724 12/27/18 0737   12/26/18 1615  vancomycin (VANCOCIN) 1,500 mg in sodium chloride 0.9 % 500 mL IVPB     1,500 mg 250 mL/hr over 120 Minutes Intravenous  Once 12/26/18 1552 12/26/18 1943   12/26/18 1545  ceFEPIme (MAXIPIME) 2 g in sodium chloride 0.9 % 100 mL IVPB     2 g 200 mL/hr over 30 Minutes Intravenous  Once 12/26/18 1540 12/26/18 1704   12/26/18 1545  metroNIDAZOLE (FLAGYL) IVPB 500 mg     500 mg 100 mL/hr over 60 Minutes Intravenous  Once 12/26/18 1540 12/26/18 1809   12/26/18 1545  vancomycin (VANCOCIN) IVPB 1000 mg/200 mL premix  Status:  Discontinued     1,000 mg 200 mL/hr over 60 Minutes Intravenous  Once 12/26/18 1540 12/26/18 1552       Objective: Vitals:   12/29/18 0534 12/29/18 0847 12/29/18 0908 12/29/18 0936  BP: (!) 143/99  (!) 123/99   Pulse: (!) 106  (!) 115 (!) 105  Resp: 18  18   Temp: 97.8 F (36.6 C)  97.6 F (36.4 C)   TempSrc: Oral  Oral   SpO2: 99% 95% 97%   Weight:      Height:        Intake/Output Summary (Last 24 hours) at 12/29/2018 0958 Last data filed at 12/29/2018 0600 Gross per 24 hour  Intake 3065.66 ml  Output 725 ml  Net 2340.66 ml   Filed Weights   12/26/18 1813  Weight: 72.8 kg    Examination: General exam: Appears calm and comfortable  Respiratory system: Clear to auscultation. Respiratory effort normal. + Upper airway stridors Cardiovascular system: S1 & S2 heard, irregular rhythm, rate low 100s. No pedal edema. Gastrointestinal system: Abdomen is nondistended, soft and nontender. Normal bowel sounds heard. Central nervous system: Alert and oriented. Non focal exam. Speech clear  Extremities: Symmetric in appearance bilaterally  Skin: No rashes, lesions or ulcers on exposed skin  Psychiatry: Judgement and insight appear stable. Mood & affect appropriate.    Data Reviewed: I have personally reviewed following labs and imaging studies  CBC: Recent Labs  Lab 12/26/18 1540 12/27/18 0332  12/28/18 0533 12/29/18 0649  WBC 17.0* 15.2* 9.4 8.9  NEUTROABS 14.2* 12.6* 8.4* 7.3  HGB 11.3* 11.3* 10.5* 10.9*  HCT 34.0* 34.7* 32.0* 32.9*  MCV 91.6 92.3 90.9 90.4  PLT 72* 59* 60* 70*   Basic Metabolic Panel: Recent Labs  Lab 12/26/18 1540 12/27/18 0332 12/27/18 1535 12/28/18 0533 12/29/18 0649  NA 127* 132* 134* 134* 135  K 4.7 4.2 4.0 3.8 3.9  CL 96* 103 103 107 108  CO2 19* 17* 17* 18* 17*  GLUCOSE 131* 99 115* 137* 111*  BUN 67* 66* 61* 52* 42*  CREATININE 2.70* 2.16* 1.91* 1.71* 1.48*  CALCIUM 7.8* 8.0* 8.2* 7.9* 8.1*  MG 1.7  --   --  2.0 1.8   GFR: Estimated Creatinine Clearance: 35.2 mL/min (A) (by C-G formula based on SCr of 1.48 mg/dL (H)). Liver Function Tests: Recent Labs  Lab 12/26/18 1540 12/27/18 0332 12/28/18 0533  AST 43* 37  47*  ALT 23 24 36  ALKPHOS 68 70 66  BILITOT 1.4* 1.4* 0.6  PROT 5.1* 5.3* 4.9*  ALBUMIN 2.6* 2.5* 2.1*   No results for input(s): LIPASE, AMYLASE in the last 168 hours. No results for input(s): AMMONIA in the last 168 hours. Coagulation Profile: Recent Labs  Lab 12/26/18 1540 12/27/18 0332  INR 2.9* 2.0*   Cardiac Enzymes: No results for input(s): CKTOTAL, CKMB, CKMBINDEX, TROPONINI in the last 168 hours. BNP (last 3 results) No results for input(s): PROBNP in the last 8760 hours. HbA1C: No results for input(s): HGBA1C in the last 72 hours. CBG: No results for input(s): GLUCAP in the last 168 hours. Lipid Profile: No results for input(s): CHOL, HDL, LDLCALC, TRIG, CHOLHDL, LDLDIRECT in the last 72 hours. Thyroid Function Tests: No results for input(s): TSH, T4TOTAL, FREET4, T3FREE, THYROIDAB in the last 72 hours. Anemia Panel: No results for input(s): VITAMINB12, FOLATE, FERRITIN, TIBC, IRON, RETICCTPCT in the last 72 hours. Sepsis Labs: Recent Labs  Lab 12/26/18 1540 12/26/18 2000 12/26/18 2229 12/27/18 0129 12/27/18 0332  PROCALCITON  --   --   --   --  8.88  LATICACIDVEN 3.1* 1.8 2.2* 1.4  --      Recent Results (from the past 240 hour(s))  Novel Coronavirus, NAA ([facility])     Status: None   Collection Time: 12/25/18 12:00 AM   Specimen: Nasopharyngeal(NP) swabs in vial transport medium   NASOPHARYNGE  TESTING  Result Value Ref Range Status   SARS-CoV-2, NAA Not Detected Not Detected Final    Comment: This nucleic acid amplification test was developed and its performance characteristics determined by [employer]. Nucleic acid amplification tests include PCR and TMA. This test has not been FDA cleared or approved. This test has been authorized by FDA under an Emergency Use Authorization (EUA). This test is only authorized for the duration of time the declaration that circumstances exist justifying the authorization of the emergency use of in vitro diagnostic tests for detection of SARS-CoV-2 virus and/or diagnosis of COVID-19 infection under section 564(b)(1) of the Act, 21 U.S.C. [id number]) (1), unless the authorization is terminated or revoked sooner. When diagnostic testing is negative, the possibility of a false negative result should be considered in the context of a patient's recent exposures and the presence of clinical signs and symptoms consistent with COVID-19. An individual without symptoms of COVID-19 and who is not shedding SARS-CoV-2 virus would  expect to have a negative (not detected) result in this assay.   Blood Culture (routine x 2)     Status: Abnormal   Collection Time: 12/26/18  4:03 PM   Specimen: BLOOD  Result Value Ref Range Status   Specimen Description BLOOD LEFT ANTECUBITAL  Final   Special Requests   Final    BOTTLES DRAWN AEROBIC AND ANAEROBIC Blood Culture results may not be optimal due to an inadequate volume of blood received in culture bottles   Culture  Setup Time   Final    IN BOTH AEROBIC AND ANAEROBIC BOTTLES GRAM NEGATIVE RODS CRITICAL VALUE NOTED.  VALUE IS CONSISTENT WITH PREVIOUSLY REPORTED AND CALLED VALUE.    Culture  (A)  Final    ESCHERICHIA COLI SUSCEPTIBILITIES PERFORMED ON PREVIOUS CULTURE WITHIN THE LAST 5 DAYS. Performed at [facility], [place] [address] [place], [place] [address]    Report Status 12/29/2018 FINAL  Final  Blood Culture (routine x 2)     Status: Abnormal   Collection Time: 12/26/18  4:11  PM   Specimen: BLOOD RIGHT ARM  Result Value Ref Range Status   Specimen Description BLOOD RIGHT ARM  Final   Special Requests   Final    BOTTLES DRAWN AEROBIC AND ANAEROBIC Blood Culture adequate volume   Culture  Setup Time   Final    IN BOTH AEROBIC AND ANAEROBIC BOTTLES GRAM NEGATIVE RODS CRITICAL RESULT CALLED TO, READ BACK BY AND VERIFIED WITH: C AMEND [facility] 12/27/18 [id number] JDW Performed at [facility], [place] [address] [place], [place] [address]    Culture ESCHERICHIA COLI (A)  Final   Report Status 12/29/2018 FINAL  Final   Organism ID, Bacteria ESCHERICHIA COLI  Final      Susceptibility   Escherichia coli - MIC*    AMPICILLIN 4 SENSITIVE Sensitive     CEFAZOLIN <=4 SENSITIVE Sensitive     CEFEPIME <=1 SENSITIVE Sensitive     CEFTAZIDIME <=1 SENSITIVE Sensitive     CEFTRIAXONE <=1 SENSITIVE Sensitive     CIPROFLOXACIN <=0.25 SENSITIVE Sensitive     GENTAMICIN <=1 SENSITIVE Sensitive     IMIPENEM <=0.25 SENSITIVE Sensitive     TRIMETH/SULFA <=20 SENSITIVE Sensitive     AMPICILLIN/SULBACTAM <=2 SENSITIVE Sensitive     PIP/TAZO <=4 SENSITIVE Sensitive     Extended ESBL NEGATIVE Sensitive     * ESCHERICHIA COLI  Blood Culture ID Panel (Reflexed)     Status: Abnormal   Collection Time: 12/26/18  4:11 PM  Result Value Ref Range Status   Enterococcus species NOT DETECTED NOT DETECTED Final   Listeria monocytogenes NOT DETECTED NOT DETECTED Final   Staphylococcus species NOT DETECTED NOT DETECTED Final   Staphylococcus aureus (BCID) NOT DETECTED NOT DETECTED Final   Streptococcus species NOT DETECTED NOT DETECTED Final   Streptococcus agalactiae NOT DETECTED NOT  DETECTED Final   Streptococcus pneumoniae NOT DETECTED NOT DETECTED Final   Streptococcus pyogenes NOT DETECTED NOT DETECTED Final   Acinetobacter baumannii NOT DETECTED NOT DETECTED Final   Enterobacteriaceae species DETECTED (A) NOT DETECTED Final    Comment: Enterobacteriaceae represent a large family of gram-negative bacteria, not a single organism. CRITICAL RESULT CALLED TO, READ BACK BY AND VERIFIED WITH: C AMEND PHARMD 12/27/18 625 JDW    Enterobacter cloacae complex NOT DETECTED NOT DETECTED Final   Escherichia coli DETECTED (A) NOT DETECTED Final    Comment: CRITICAL RESULT CALLED TO, READ BACK BY AND VERIFIED WITH: C AMEND PHARMD 12/27/18 0625 JDW    Klebsiella oxytoca NOT DETECTED NOT DETECTED Final   Klebsiella pneumoniae NOT DETECTED NOT DETECTED Final   Proteus species NOT DETECTED NOT DETECTED Final   Serratia marcescens NOT DETECTED NOT DETECTED Final   Carbapenem resistance NOT DETECTED NOT DETECTED Final   Haemophilus influenzae NOT DETECTED NOT DETECTED Final   Neisseria meningitidis NOT DETECTED NOT DETECTED Final   Pseudomonas aeruginosa NOT DETECTED NOT DETECTED Final   Candida albicans NOT DETECTED NOT DETECTED Final   Candida glabrata NOT DETECTED NOT DETECTED Final   Candida krusei NOT DETECTED NOT DETECTED Final   Candida parapsilosis NOT DETECTED NOT DETECTED Final   Candida tropicalis NOT DETECTED NOT DETECTED Final    Comment: Performed at [facility], [place] [address], [place], [place] [address]  Urine culture     Status: Abnormal   Collection Time: 12/26/18  4:17 PM   Specimen: In/Out Cath Urine  Result Value Ref Range Status   Specimen Description IN/OUT CATH URINE  Final   Special Requests  Final    NONE Performed at [facility], [place] [address], [place], [place] [address]    Culture >=100,000 COLONIES/mL ESCHERICHIA COLI (A)  Final   Report Status 12/28/2018 FINAL  Final   Organism ID, Bacteria ESCHERICHIA COLI (A)  Final       Susceptibility   Escherichia coli - MIC*    AMPICILLIN 4 SENSITIVE Sensitive     CEFAZOLIN <=4 SENSITIVE Sensitive     CEFTRIAXONE <=1 SENSITIVE Sensitive     CIPROFLOXACIN <=0.25 SENSITIVE Sensitive     GENTAMICIN <=1 SENSITIVE Sensitive     IMIPENEM <=0.25 SENSITIVE Sensitive     NITROFURANTOIN <=16 SENSITIVE Sensitive     TRIMETH/SULFA <=20 SENSITIVE Sensitive     AMPICILLIN/SULBACTAM <=2 SENSITIVE Sensitive     PIP/TAZO <=4 SENSITIVE Sensitive     Extended ESBL NEGATIVE Sensitive     * >=100,000 COLONIES/mL ESCHERICHIA COLI  SARS CORONAVIRUS 2 (TAT 6-24 HRS) Nasopharyngeal Nasopharyngeal Swab     Status: None   Collection Time: 12/26/18  4:35 PM   Specimen: Nasopharyngeal Swab  Result Value Ref Range Status   SARS Coronavirus 2 NEGATIVE NEGATIVE Final    Comment: (NOTE) SARS-CoV-2 target nucleic acids are NOT DETECTED. The SARS-CoV-2 RNA is generally detectable in upper and lower respiratory specimens during the acute phase of infection. Negative results do not preclude SARS-CoV-2 infection, do not rule out co-infections with other pathogens, and should not be used as the sole basis for treatment or other patient management decisions. Negative results must be combined with clinical observations, patient history, and epidemiological information. The expected result is Negative. Fact Sheet for Patients: SugarRoll.be Fact Sheet for Healthcare Providers: https://www.woods-mathews.com/ This test is not yet approved or cleared by the [place] FDA and  has been authorized for detection and/or diagnosis of SARS-CoV-2 by FDA under an Emergency Use Authorization (EUA). This EUA will remain  in effect (meaning this test can be used) for the duration of the COVID-19 declaration under Section 56 4(b)(1) of the Act, 21 U.S.C. section 360bbb-3(b)(1), unless the authorization is terminated or revoked sooner. Performed at [facility] [place], [place] [address] [place], [place] [address]       Radiology Studies: No results found.    Scheduled Meds: . acidophilus  1 capsule Oral Daily  . apixaban  2.5 mg Oral BID  . atenolol  25 mg Oral Daily  . atorvastatin  20 mg Oral q1800  . buPROPion  150 mg Oral Daily  . cholecalciferol  2,000 Units Oral Daily  . dofetilide  125 mcg Oral BID  . fluticasone  2 spray Each Nare Daily  . fluticasone  1 puff Inhalation BID  . magnesium oxide  400 mg Oral BID  . pantoprazole  40 mg Oral Daily  . sodium chloride flush  3 mL Intravenous Q12H   Continuous Infusions: . sodium chloride 75 mL/hr at 12/28/18 2234  . sodium chloride    . cefTRIAXone (ROCEPHIN)  IV 2 g (12/28/18 1620)     LOS: 3 days      Time spent: 35 minutes   Dessa Phi, DO [facility] 12/29/2018, 9:58 AM   Available via Epic secure chat 7am-7pm After these hours, please refer to coverage provider listed on [email or web address]

## 2018-12-29 NOTE — Consult Note (Signed)
Advanced Heart Failure Team Consult Note   Primary Physician: [name], MD PCP-Cardiologist:  No primary care provider on file.  Reason for Consultation: Atrial fibrillation  HPI:    [name] Spence, MD is seen today for evaluation of atrial fibrillation at the request of Dr. Maylene Roes.   85 y.o. retired radiologist with CAD s/p CFX PCI in 11/10, asthma, and paroxysmal atrial fibrillation.  He had an RV infarct with chronic total occlusion of a small nondominant RCA in the 1980s.  He had a drug-eluting stent to the CFX in 11/10.    He is very symptomatic with atrial fibrillation episodes. He was started on Multaq but continued to have episodes of atrial fibrillation.  This was stopped, and he was admitted for Tikosyn loading.  After he went home, he developed left facial droop and left arm weakness.  He was found to have a small right parietal cortical infarct with hemorrhagic conversion.  His symptoms resolved quickly. INR was subtherapeutic at admission.  Warfarin was stopped and he was put on apixaban.   In 9/17, he was admitted with LLL PNA.  During the admission, he had a couple of relatively short runs of atrial fibrillation.   In 5/19, he had bilateral orchiectomy for metastatic prostate cancer.  His PSA has come down considerably.   In 1/20, he had influenza that progressed to PNA.  He was hospitalized with transient atrial fibrillation.    Prior to admission, patient felt weak/tired for a week.  He became lightheaded and lethargic.  He was brought to the ER, SBP in 70s.  Lactate was elevated.  CT abdomen showed perinephric stranding and UA suggested UTI.  Cultures have shown E coli. He was noted to be in AKI with creatinine > 2.  Initially in NSR at the time of arrival to the hospital.   He was treated initially for septic shock with IV fluid and broad spectrum abx, now on ceftriaxone.    He is feeling better.  However, noted to be markedly orthostatic yesterday.  No fever.   Creatinine improved to 1.48.  Main complaint today is wheezing, he is concerned that his asthma is flaring.  He has gone into atrial fibrillation since he arrived in the hospital, HR around 100 bpm.   Review of Systems: All systems reviewed and negative except as per HPI  Home Medications Prior to Admission medications   Medication Sig Start Date End Date Taking? Authorizing Provider  apixaban (ELIQUIS) 5 MG TABS tablet Take 1 tablet (5 mg total) by mouth 2 (two) times daily. Needs apt for further refills 11/01/18  Yes Larey Dresser, MD  atenolol (TENORMIN) 25 MG tablet TAKE 1 TABLET BY MOUTH DAILY. Patient taking differently: Take 25 mg by mouth daily.  06/29/18  Yes Sherran Needs, NP  atorvastatin (LIPITOR) 20 MG tablet TAKE 1 TABLET(20 MG) BY MOUTH DAILY Patient taking differently: Take 20 mg by mouth daily.  12/12/18  Yes Burchette, Alinda Sierras, MD  buPROPion (WELLBUTRIN SR) 150 MG 12 hr tablet Take 1 tablet (150 mg total) by mouth daily. 11/21/17  Yes [name], MD  cetirizine (ZYRTEC) 10 MG tablet Take 10 mg by mouth daily with supper.    Yes [provider]  Cholecalciferol (VITAMIN D) 2000 units tablet Take 2,000 Units by mouth daily.   Yes [provider]  diltiazem (CARDIZEM) 30 MG tablet Take 30 mg by mouth as needed.   Yes [provider]  dofetilide (  TIKOSYN) 250 MCG capsule Take 1 capsule (250 mcg total) by mouth 2 (two) times daily. Needs appt for further refills 10/11/18  Yes Larey Dresser, MD  fluticasone [facility]) 50 MCG/ACT nasal spray Place 2 sprays into both nostrils daily. 09/28/18  Yes Brand Males, MD  Fluticasone Furoate (ARNUITY ELLIPTA) 200 MCG/ACT AEPB Inhale 1 puff into the lungs daily. 09/28/18  Yes Brand Males, MD  LORazepam (ATIVAN) 1 MG tablet TAKE 1/2 TO 1 TABLET BY MOUTH AT BEDTIME AS NEEDED Patient taking differently: Take 0.5-1 mg by mouth at bedtime as needed for anxiety.  10/11/18  Yes Burchette, Alinda Sierras, MD    nitroGLYCERIN (NITROSTAT) 0.4 MG SL tablet Place 1 tablet (0.4 mg total) under the tongue every 5 (five) minutes x 3 doses as needed for chest pain. 01/04/17  Yes [name], MD  omeprazole (PRILOSEC OTC) 20 MG tablet Take 1 tablet (20 mg total) by mouth daily. 09/17/13  Yes [name], MD  traMADol (ULTRAM) 50 MG tablet Take 1 tablet (50 mg total) by mouth every 12 (twelve) hours as needed. Patient taking differently: Take 50 mg by mouth every 12 (twelve) hours as needed for moderate pain.  07/07/18  Yes Burchette, Alinda Sierras, MD    Past Medical History: 1. CAD: RV infarct in the 1980s.  Chronic total occlusion of a small nondominant RCA.  11/10 PCI to CFX with Xience DES.  EF 60% on LV-gram at that time. ETT (11/14) with 9'07" exercise, no significant ST depression.  2.  Paroxysmal atrial fibrillation: Multaq was ineffective. Now on Tikosyn.  3.  Hyperlipidemia 4. GERD 5. HTN 6. Asthma 7. Chronic diastolic CHF: Echo (6/29): EF 55-60%, mild LV hypertrophy, no regional WMAs, mild AI, mild MR, mild-moderate biatrial enlargement, PA systolic pressure 51 mmHg. Echo (7/16) with EF 60-65%, moderate diastolic dysfunction, normal RV size and systolic function, mild-moderate MR, moderate TR, moderate biatrial enlargement, PA systolic pressure 65 mmHg.  - Echo (5/18): EF 55-60%, mild AI, mild MR, PASP 60 mmHg, normal RV size and systolic function.  - Echo (8/19): EF 60-65%, normal RV size and systolic function, severe TR with PASP 60 mmHg.  8. CKD 9. CVA: Right parietal cortical CVA with peri-infarct hemorrhagic conversion.  CTA showed right M2 branch flow gap.   10. Prostate cancer: Metastatic.  - Bilateral orchiectomy in 5/19.   Past Surgical History: Past Surgical History:  Procedure Laterality Date   CARDIAC CATHETERIZATION     CATARACT EXTRACTION W/ INTRAOCULAR LENS  IMPLANT, BILATERAL Bilateral ?2013   [place] WITH STENT PLACEMENT  2010    hospital ccu  1984   heart attack, hypoplastic right coronary   INGUINAL HERNIA REPAIR Right ~ [place] Bilateral 06/10/2017   Procedure: BILATERAL ORCHIECTOMY;  Surgeon: Franchot Gallo, MD;  Location: [facility];  Service: Urology;  Laterality: Bilateral;  MAC ANESTHESIA AND LOCAL   TONSILLECTOMY     TOTAL KNEE ARTHROPLASTY Bilateral 1998-2004   "right-left"   VAGOTOMY AND PYLOROPLASTY  1988   "bleeding duodenal ulcer"    Family History: Family History  Problem Relation Age of Onset   Coronary artery disease Mother        deceased   Deep vein thrombosis Sister    Coronary artery disease Brother    Asthma Brother    Colon cancer Neg Hx     Social History: Social History   Socioeconomic History  Marital status: Married    Spouse name: Not on file   Number of children: 2   Years of education: Not on file   Highest education level: Not on file  Occupational History   Occupation: IT sales professional: RETIRED    Comment: radiologist  Social Needs   Financial resource strain: Not hard at all   Food insecurity    Worry: Never true    Inability: Never true   Transportation needs    Medical: No    Non-medical: No  Tobacco Use   Smoking status: Never Smoker   Smokeless tobacco: Never Used  Substance and Sexual Activity   Alcohol use: No   Drug use: No   Sexual activity: Not Currently  Lifestyle   Physical activity    Days per week: 0 days    Minutes per session: 0 min   Stress: Only a little  Relationships   Social connections    Talks on phone: More than three times a week    Gets together: Three times a week    Attends religious service: Not on file    Active member of club or organization: Yes    Attends meetings of clubs or organizations: More than 4 times per year    Relationship status: Married  Other Topics Concern   Not on file  Social History Narrative   Married. Regular exercise - yes.        03/08/2018: Married to Therapist, sports, lives in 3 story home, although lives on first level mostly. Using rolling walker lately s/p hospitalization for PNA/flu, tripped over rug recently. Will be receiving PT in-home soon.       Has two grown children.      Enjoys golfing; drives to [place] with wife every winter for a few days    Allergies:  Allergies  Allergen Reactions   Tylenol [Acetaminophen] Rash   Azithromycin    Advil [Ibuprofen] Nausea Only and Rash   Asa [Aspirin] Rash    Objective:    Vital Signs:   Temp:  [97.6 F (36.4 C)-98.7 F (37.1 C)] 97.6 F (36.4 C) (11/20 0908) Pulse Rate:  [68-115] 105 (11/20 0936) Resp:  [16-18] 18 (11/20 0908) BP: (102-143)/(71-99) 123/99 (11/20 0908) SpO2:  [94 %-99 %] 97 % (11/20 0908) Last BM Date: 12/28/18  Weight change: Filed Weights   12/26/18 1813  Weight: 72.8 kg    Intake/Output:   Intake/Output Summary (Last 24 hours) at 12/29/2018 1308 Last data filed at 12/29/2018 1058 Gross per 24 hour  Intake 3185.66 ml  Output 1075 ml  Net 2110.66 ml      Physical Exam    General:  Frail HEENT: normal Neck: supple. JVP 7-8 cm. Carotids 2+ bilat; no bruits. No lymphadenopathy or thyromegaly appreciated. Cor: PMI nondisplaced. Regular rate & rhythm. 1/6 HSM LLSB.  Lungs: Diffuse wheezing bilaterally.  Abdomen: soft, nontender, nondistended. No hepatosplenomegaly. No bruits or masses. Good bowel sounds. Extremities: no cyanosis, clubbing, rash, edema Neuro: alert & orientedx3, cranial nerves grossly intact. moves all 4 extremities w/o difficulty. Affect pleasant   Telemetry   Atrial fibrillation rate 100s (personally reviewed)  EKG    NSR, QTc 519 msec (personally reviewed)  Labs   Basic Metabolic Panel: Recent Labs  Lab 12/26/18 1540 12/27/18 0332 12/27/18 1535 12/28/18 0533 12/29/18 0649  NA 127* 132* 134* 134* 135  K 4.7 4.2 4.0 3.8 3.9  CL 96* 103 103 107 108  CO2 19* 17* 17* 18* 17*  GLUCOSE 131*  99 115* 137* 111*  BUN 67* 66* 61* 52* 42*  CREATININE 2.70* 2.16* 1.91* 1.71* 1.48*  CALCIUM 7.8* 8.0* 8.2* 7.9* 8.1*  MG 1.7  --   --  2.0 1.8    Liver Function Tests: Recent Labs  Lab 12/26/18 1540 12/27/18 0332 12/28/18 0533  AST 43* 37 47*  ALT 23 24 36  ALKPHOS 68 70 66  BILITOT 1.4* 1.4* 0.6  PROT 5.1* 5.3* 4.9*  ALBUMIN 2.6* 2.5* 2.1*   No results for input(s): LIPASE, AMYLASE in the last 168 hours. No results for input(s): AMMONIA in the last 168 hours.  CBC: Recent Labs  Lab 12/26/18 1540 12/27/18 0332 12/28/18 0533 12/29/18 0649  WBC 17.0* 15.2* 9.4 8.9  NEUTROABS 14.2* 12.6* 8.4* 7.3  HGB 11.3* 11.3* 10.5* 10.9*  HCT 34.0* 34.7* 32.0* 32.9*  MCV 91.6 92.3 90.9 90.4  PLT 72* 59* 60* 70*    Cardiac Enzymes: No results for input(s): CKTOTAL, CKMB, CKMBINDEX, TROPONINI in the last 168 hours.  BNP: BNP (last 3 results) Recent Labs    12/26/18 1552  BNP 334.3*    ProBNP (last 3 results) No results for input(s): PROBNP in the last 8760 hours.   CBG: No results for input(s): GLUCAP in the last 168 hours.  Coagulation Studies: Recent Labs    12/26/18 1540 12/27/18 0332  LABPROT 30.6* 22.1*  INR 2.9* 2.0*     Imaging    No results found.   Medications:     Current Medications:  acidophilus  1 capsule Oral Daily   apixaban  2.5 mg Oral BID   atenolol  25 mg Oral Daily   atorvastatin  20 mg Oral q1800   buPROPion  150 mg Oral Daily   cholecalciferol  2,000 Units Oral Daily   dofetilide  125 mcg Oral BID   fluticasone  2 spray Each Nare Daily   fluticasone  1 puff Inhalation BID   ipratropium-albuterol  3 mL Nebulization Q6H   magnesium oxide  400 mg Oral BID   pantoprazole  40 mg Oral Daily   sodium chloride flush  3 mL Intravenous Q12H     Infusions:  sodium chloride 75 mL/hr at 12/28/18 2234   sodium chloride     cefTRIAXone (ROCEPHIN)  IV 2 g (12/28/18 1620)      Assessment/Plan   1.  UTI/pyelonephritis with septic shock: Improved, cultures showed E coli and he is on ceftriaxone.  2. Asthma: Patient is wheezing a lot.  He has a history of asthma.  He does not look particularly volume overloaded by exam to me.   - I will make Duonebs standing q6 hrs for now.  - CXR PA/lateral - Check BNP.  - He is eating and drinking pretty well at this point, would stop IV fluid for the time being.  3. CAD: No chest pain.    - He is on apixaban without ASA given stable CAD.   - Continue statin, beta blocker.  4. Chronic diastolic CHF:  Most recent echo in 8/19 showed EF 52-84% but PA systolic pressure was estimated at 60 mmHg and there was severe TR (progressed from moderate), the RV looked normal.  I suspect that he has a degree of RV failure.  He is off Lasix and getting IV fluid with episode of septic shock.  BP stable in bed and creatinine improving, down to 1.4.  He is wheezing diffusely.  This may be asthma as he does not appear  to be markedly volume overloaded.  - Stop IV fluid for now.  - Check BNP and CXR.  5. Atrial fibrillation: Paroxysmal.  Episodes are quite symptomatic, he feels "bad" with atrial fibrillation even when he's rate-controlled. He had frequent breakthrough on Multaq but has seemed to be doing better on Tikosyn.  He is currently in atrial fibrillation, looks like it was probably triggered by current illness.  - Tikosyn decreased to 125 mcg bid with AKI this admission.  Will check ECG for QTc today.  Hopefully can go back to 250 mcg bid prior to discharge.  - Eliquis decreased to 2.5 mg bid (elevated creatinine + age > 49).  6. AKI on CKD: Creatinine > 2 at admission in setting of septic shock.  Resolving.   Length of Stay: 3  Loralie Champagne, MD  12/29/2018, 1:08 PM  Advanced Heart Failure Team Pager [phone] (M-F; 7a - 4p)  Please contact [facility] for night-coverage after hours (4p -7a ) and weekends on [email or web address]

## 2018-12-30 DIAGNOSIS — I5033 Acute on chronic diastolic (congestive) heart failure: Secondary | ICD-10-CM

## 2018-12-30 LAB — BASIC METABOLIC PANEL
Anion gap: 10 (ref 5–15)
BUN: 34 mg/dL — ABNORMAL HIGH (ref 8–23)
CO2: 17 mmol/L — ABNORMAL LOW (ref 22–32)
Calcium: 8.1 mg/dL — ABNORMAL LOW (ref 8.9–10.3)
Chloride: 106 mmol/L (ref 98–111)
Creatinine, Ser: 1.28 mg/dL — ABNORMAL HIGH (ref 0.61–1.24)
GFR calc Af Amer: 58 mL/min — ABNORMAL LOW (ref >60)
GFR calc non Af Amer: 50 mL/min — ABNORMAL LOW (ref >60)
Glucose, Bld: 116 mg/dL — ABNORMAL HIGH (ref 70–99)
Potassium: 4.2 mmol/L (ref 3.5–5.1)
Sodium: 133 mmol/L — ABNORMAL LOW (ref 135–145)

## 2018-12-30 LAB — CBC
HCT: 32 % — ABNORMAL LOW (ref 39.0–52.0)
Hemoglobin: 10.4 g/dL — ABNORMAL LOW (ref 13.0–17.0)
MCH: 29.9 pg (ref 26.0–34.0)
MCHC: 32.5 g/dL (ref 30.0–36.0)
MCV: 92 fL (ref 80.0–100.0)
Platelets: 76 10*3/uL — ABNORMAL LOW (ref 150–400)
RBC: 3.48 MIL/uL — ABNORMAL LOW (ref 4.22–5.81)
RDW: 15.2 % (ref 11.5–15.5)
WBC: 10.5 10*3/uL (ref 4.0–10.5)
nRBC: 0 % (ref 0.0–0.2)

## 2018-12-30 LAB — MAGNESIUM: Magnesium: 1.6 mg/dL — ABNORMAL LOW (ref 1.7–2.4)

## 2018-12-30 MED ORDER — POTASSIUM CHLORIDE CRYS ER 20 MEQ PO TBCR
20.0000 meq | EXTENDED_RELEASE_TABLET | Freq: Once | ORAL | Status: AC
  Administered 2018-12-30: 20 meq via ORAL
  Filled 2018-12-30: qty 1

## 2018-12-30 MED ORDER — FUROSEMIDE 10 MG/ML IJ SOLN
INTRAMUSCULAR | Status: AC
  Filled 2018-12-30: qty 4

## 2018-12-30 MED ORDER — IPRATROPIUM-ALBUTEROL 0.5-2.5 (3) MG/3ML IN SOLN
3.0000 mL | Freq: Four times a day (QID) | RESPIRATORY_TRACT | Status: DC | PRN
  Administered 2018-12-31: 3 mL via RESPIRATORY_TRACT
  Filled 2018-12-30: qty 3

## 2018-12-30 MED ORDER — FUROSEMIDE 10 MG/ML IJ SOLN
40.0000 mg | Freq: Once | INTRAMUSCULAR | Status: AC
  Administered 2018-12-30: 40 mg via INTRAVENOUS
  Filled 2018-12-30: qty 4

## 2018-12-30 MED ORDER — MAGNESIUM SULFATE 2 GM/50ML IV SOLN
2.0000 g | Freq: Once | INTRAVENOUS | Status: AC
  Administered 2018-12-30: 2 g via INTRAVENOUS
  Filled 2018-12-30: qty 50

## 2018-12-30 NOTE — Progress Notes (Addendum)
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  Patient states that he had a possible Covid exposure about a week ago when he was playing golf with a friend.  The whole week he was tired and weak. Today he was weak and slid down from his chair to the floor.  He was not not able to get up.  He came to emergency room when he was found to be hypotensive in the 70s per EMS. Covid test resulted negative. In the emergency room, he was found hypotensive with sepsis criteria secondary to UTI, CT abdomen and pelvis show perinephritic stranding both kidney more on the right. Lactic acid 3.1. Receive IV fluids per sepsis protocol and was started on cefepime and vancomycin.   New events last 24 hours / Subjective: Still having some wheezes. States he feels like he "has a bit of sundowning."    Assessment & Plan:   Principal Problem:   Sepsis ([place]) Active Problems:   Hyperlipidemia   Anxiety state   Essential hypertension   CAD S/P OM PCI 2010   Mild persistent asthma without complication   GERD   Paroxysmal atrial fibrillation (HCC)   Abnormality of gait   Acute lower UTI   Congestive heart failure with left ventricular diastolic dysfunction (HCC)   Acute kidney injury superimposed on CKD ([place])   Bacteremia   Abnormal x-ray of pelvis 11/17   Pyelonephritis   Metabolic acidosis    Severe sepsis secondary to E Coli UTI, pyelonephritis, bacteremia -Renal CT with bladder wall thickening and probable outlet obstruction due to prostatomegaly -Blood culture obtained 11/17 pansensitive E Coli  -Urine  culture obtained 11/17 pansensitive E Coli -Continue Rocephin -WBC normalized -Repeat blood culture 11/20 pending  -Overall sepsis pathology improving   Acute kidney injury superimposed CKD II -Creatinine continues to improve, monitor BMP   Orthostatic hypotension -120/79 supine --> 79/57 standing at 3 minutes on 11/19  -Repeat orthostatic 113/94 supine --> 101/72 on 11/20   Acute on chronic diastolic heart failure -Echo 09/2017 with EF 60% and grade 1 diastolic dysfunction -Wife reports lasix stopped last week as he was "so weak" -IV lasix one time ordered -Appreciate advanced heart failure service  Paroxysmal Afib -Resume home Eliquis, atenolol, Tikosyn  -Continue telemetry  Abnormal xray of pelvis -Hx recent fall. Pelvic xray with small osseous fragment seen adjacent to the inferior right femoral head which could represent a small chip fracture -PT evaluation commending home health  Mild persistent asthma without complication -Continue duo nebs, flutter valve   Anxiety -Continue wellbutrin   CAD s/p PCI 2010 -No chest pain -Continue lipitor  Hypomagnesemia -Replace     DVT prophylaxis: Eliquis  Code Status: Full Family Communication: Discussed with wife over the phone today  Disposition Plan: Pending clinical improvement. Hopeful discharge home with home health in 1-2 days.    Consultants:   Cardiology  Procedures:   None   Antimicrobials:  Anti-infectives (From admission, onward)   Start     Dose/Rate Route Frequency Ordered Stop   12/27/18 1630  ceFEPIme (MAXIPIME) 2 g in sodium chloride 0.9 % 100 mL IVPB  Status:  Discontinued     2 g 200 mL/hr over 30 Minutes Intravenous Every 24 hours 12/26/18 1724 12/27/18 0758   12/27/18 1600  cefTRIAXone (ROCEPHIN) 2 g in sodium chloride 0.9 % 100 mL IVPB     2 g 200 mL/hr over 30 Minutes Intravenous Every 24 hours 12/27/18 0758     12/26/18 1723  vancomycin variable dose per unstable renal function  (pharmacist dosing)  Status:  Discontinued      Does not apply See admin instructions 12/26/18 1724 12/27/18 0737   12/26/18 1615  vancomycin (VANCOCIN) 1,500 mg in sodium chloride 0.9 % 500 mL IVPB     1,500 mg 250 mL/hr over 120 Minutes Intravenous  Once 12/26/18 1552 12/26/18 1943   12/26/18 1545  ceFEPIme (MAXIPIME) 2 g in sodium chloride 0.9 % 100 mL IVPB     2 g 200 mL/hr over 30 Minutes Intravenous  Once 12/26/18 1540 12/26/18 1704   12/26/18 1545  metroNIDAZOLE (FLAGYL) IVPB 500 mg     500 mg 100 mL/hr over 60 Minutes Intravenous  Once 12/26/18 1540 12/26/18 1809   12/26/18 1545  vancomycin (VANCOCIN) IVPB 1000 mg/200 mL premix  Status:  Discontinued     1,000 mg 200 mL/hr over 60 Minutes Intravenous  Once 12/26/18 1540 12/26/18 1552       Objective: Vitals:   12/29/18 2052 12/30/18 0225 12/30/18 0445 12/30/18 1000  BP: 127/69  122/74 130/82  Pulse: 93  100 98  Resp: 20  18 18   Temp: 98.4 F (36.9 C)  98.1 F (36.7 C)   TempSrc: Oral  Oral   SpO2: 99% 95% 98% 99%  Weight:      Height:        Intake/Output Summary (Last 24 hours) at 12/30/2018 1034 Last data filed at 12/30/2018 0600 Gross per 24 hour  Intake 1064.54 ml  Output 725 ml  Net 339.54 ml   Filed Weights   12/26/18 1813  Weight: 72.8 kg    Examination: General exam: Appears calm and comfortable  Respiratory system: +Scattered wheezes and stridor. Respiratory effort normal. Cardiovascular system: S1 & S2 heard, Irreg rhythm, rate 100s. No pedal edema. Gastrointestinal system: Abdomen is nondistended, soft and nontender. Normal bowel sounds heard. Central nervous system: Alert and oriented. Non focal exam. Speech clear  Extremities: Symmetric in appearance bilaterally  Skin: No rashes, lesions or ulcers on exposed skin  Psychiatry: Judgement and insight appear stable. Mood & affect appropriate.    Data Reviewed: I have personally reviewed following labs and imaging studies  CBC: Recent Labs   Lab 12/26/18 1540 12/27/18 0332 12/28/18 0533 12/29/18 0649 12/30/18 0451  WBC 17.0* 15.2* 9.4 8.9 10.5  NEUTROABS 14.2* 12.6* 8.4* 7.3  --   HGB 11.3* 11.3* 10.5* 10.9* 10.4*  HCT 34.0* 34.7* 32.0* 32.9* 32.0*  MCV 91.6 92.3 90.9 90.4 92.0  PLT 72* 59* 60* 70* 76*   Basic Metabolic Panel: Recent Labs  Lab 12/26/18 1540 12/27/18 0332 12/27/18 1535 12/28/18 0533 12/29/18 0649 12/30/18 0451  NA 127* 132* 134* 134* 135 133*  K 4.7 4.2 4.0 3.8 3.9 4.2  CL 96* 103 103 107 108 106  CO2 19* 17* 17* 18* 17* 17*  GLUCOSE 131* 99 115* 137* 111* 116*  BUN 67* 66* 61* 52* 42* 34*  CREATININE 2.70* 2.16* 1.91* 1.71* 1.48* 1.28*  CALCIUM 7.8* 8.0* 8.2* 7.9* 8.1* 8.1*  MG 1.7  --   --  2.0 1.8 1.6*   GFR: Estimated Creatinine Clearance:  40.7 mL/min (A) (by C-G formula based on SCr of 1.28 mg/dL (H)). Liver Function Tests: Recent Labs  Lab 12/26/18 1540 12/27/18 0332 12/28/18 0533  AST 43* 37 47*  ALT 23 24 36  ALKPHOS 68 70 66  BILITOT 1.4* 1.4* 0.6  PROT 5.1* 5.3* 4.9*  ALBUMIN 2.6* 2.5* 2.1*   No results for input(s): LIPASE, AMYLASE in the last 168 hours. No results for input(s): AMMONIA in the last 168 hours. Coagulation Profile: Recent Labs  Lab 12/26/18 1540 12/27/18 0332  INR 2.9* 2.0*   Cardiac Enzymes: No results for input(s): CKTOTAL, CKMB, CKMBINDEX, TROPONINI in the last 168 hours. BNP (last 3 results) No results for input(s): PROBNP in the last 8760 hours. HbA1C: No results for input(s): HGBA1C in the last 72 hours. CBG: No results for input(s): GLUCAP in the last 168 hours. Lipid Profile: No results for input(s): CHOL, HDL, LDLCALC, TRIG, CHOLHDL, LDLDIRECT in the last 72 hours. Thyroid Function Tests: No results for input(s): TSH, T4TOTAL, FREET4, T3FREE, THYROIDAB in the last 72 hours. Anemia Panel: No results for input(s): VITAMINB12, FOLATE, FERRITIN, TIBC, IRON, RETICCTPCT in the last 72 hours. Sepsis Labs: Recent Labs  Lab 12/26/18 1540  12/26/18 2000 12/26/18 2229 12/27/18 0129 12/27/18 0332  PROCALCITON  --   --   --   --  8.88  LATICACIDVEN 3.1* 1.8 2.2* 1.4  --     Recent Results (from the past 240 hour(s))  Novel Coronavirus, NAA ([facility])     Status: None   Collection Time: 12/25/18 12:00 AM   Specimen: Nasopharyngeal(NP) swabs in vial transport medium   NASOPHARYNGE  TESTING  Result Value Ref Range Status   SARS-CoV-2, NAA Not Detected Not Detected Final    Comment: This nucleic acid amplification test was developed and its performance characteristics determined by [employer]. Nucleic acid amplification tests include PCR and TMA. This test has not been FDA cleared or approved. This test has been authorized by FDA under an Emergency Use Authorization (EUA). This test is only authorized for the duration of time the declaration that circumstances exist justifying the authorization of the emergency use of in vitro diagnostic tests for detection of SARS-CoV-2 virus and/or diagnosis of COVID-19 infection under section 564(b)(1) of the Act, 21 U.S.C. [id number]) (1), unless the authorization is terminated or revoked sooner. When diagnostic testing is negative, the possibility of a false negative result should be considered in the context of a patient's recent exposures and the presence of clinical signs and symptoms consistent with COVID-19. An individual without symptoms of COVID-19 and who is not shedding SARS-CoV-2 virus would  expect to have a negative (not detected) result in this assay.   Blood Culture (routine x 2)     Status: Abnormal   Collection Time: 12/26/18  4:03 PM   Specimen: BLOOD  Result Value Ref Range Status   Specimen Description BLOOD LEFT ANTECUBITAL  Final   Special Requests   Final    BOTTLES DRAWN AEROBIC AND ANAEROBIC Blood Culture results may not be optimal due to an inadequate volume of blood received in culture bottles   Culture  Setup Time   Final    IN BOTH AEROBIC  AND ANAEROBIC BOTTLES GRAM NEGATIVE RODS CRITICAL VALUE NOTED.  VALUE IS CONSISTENT WITH PREVIOUSLY REPORTED AND CALLED VALUE.    Culture (A)  Final    ESCHERICHIA COLI SUSCEPTIBILITIES PERFORMED ON PREVIOUS CULTURE WITHIN THE LAST 5 DAYS. Performed at [facility], [place],  [place] [address]    Report Status 12/29/2018 FINAL  Final  Blood Culture (routine x 2)     Status: Abnormal   Collection Time: 12/26/18  4:11 PM   Specimen: BLOOD RIGHT ARM  Result Value Ref Range Status   Specimen Description BLOOD RIGHT ARM  Final   Special Requests   Final    BOTTLES DRAWN AEROBIC AND ANAEROBIC Blood Culture adequate volume   Culture  Setup Time   Final    IN BOTH AEROBIC AND ANAEROBIC BOTTLES GRAM NEGATIVE RODS CRITICAL RESULT CALLED TO, READ BACK BY AND VERIFIED WITH: C AMEND [facility] 12/27/18 [id number] JDW Performed at [facility], [place] [address], [place], [place] [address]    Culture ESCHERICHIA COLI (A)  Final   Report Status 12/29/2018 FINAL  Final   Organism ID, Bacteria ESCHERICHIA COLI  Final      Susceptibility   Escherichia coli - MIC*    AMPICILLIN 4 SENSITIVE Sensitive     CEFAZOLIN <=4 SENSITIVE Sensitive     CEFEPIME <=1 SENSITIVE Sensitive     CEFTAZIDIME <=1 SENSITIVE Sensitive     CEFTRIAXONE <=1 SENSITIVE Sensitive     CIPROFLOXACIN <=0.25 SENSITIVE Sensitive     GENTAMICIN <=1 SENSITIVE Sensitive     IMIPENEM <=0.25 SENSITIVE Sensitive     TRIMETH/SULFA <=20 SENSITIVE Sensitive     AMPICILLIN/SULBACTAM <=2 SENSITIVE Sensitive     PIP/TAZO <=4 SENSITIVE Sensitive     Extended ESBL NEGATIVE Sensitive     * ESCHERICHIA COLI  Blood Culture ID Panel (Reflexed)     Status: Abnormal   Collection Time: 12/26/18  4:11 PM  Result Value Ref Range Status   Enterococcus species NOT DETECTED NOT DETECTED Final   Listeria monocytogenes NOT DETECTED NOT DETECTED Final   Staphylococcus species NOT DETECTED NOT DETECTED Final   Staphylococcus aureus (BCID)  NOT DETECTED NOT DETECTED Final   Streptococcus species NOT DETECTED NOT DETECTED Final   Streptococcus agalactiae NOT DETECTED NOT DETECTED Final   Streptococcus pneumoniae NOT DETECTED NOT DETECTED Final   Streptococcus pyogenes NOT DETECTED NOT DETECTED Final   Acinetobacter baumannii NOT DETECTED NOT DETECTED Final   Enterobacteriaceae species DETECTED (A) NOT DETECTED Final    Comment: Enterobacteriaceae represent a large family of gram-negative bacteria, not a single organism. CRITICAL RESULT CALLED TO, READ BACK BY AND VERIFIED WITH: C AMEND PHARMD 12/27/18 625 JDW    Enterobacter cloacae complex NOT DETECTED NOT DETECTED Final   Escherichia coli DETECTED (A) NOT DETECTED Final    Comment: CRITICAL RESULT CALLED TO, READ BACK BY AND VERIFIED WITH: C AMEND PHARMD 12/27/18 0625 JDW    Klebsiella oxytoca NOT DETECTED NOT DETECTED Final   Klebsiella pneumoniae NOT DETECTED NOT DETECTED Final   Proteus species NOT DETECTED NOT DETECTED Final   Serratia marcescens NOT DETECTED NOT DETECTED Final   Carbapenem resistance NOT DETECTED NOT DETECTED Final   Haemophilus influenzae NOT DETECTED NOT DETECTED Final   Neisseria meningitidis NOT DETECTED NOT DETECTED Final   Pseudomonas aeruginosa NOT DETECTED NOT DETECTED Final   Candida albicans NOT DETECTED NOT DETECTED Final   Candida glabrata NOT DETECTED NOT DETECTED Final   Candida krusei NOT DETECTED NOT DETECTED Final   Candida parapsilosis NOT DETECTED NOT DETECTED Final   Candida tropicalis NOT DETECTED NOT DETECTED Final    Comment: Performed at [facility], [place] [address] [place], [place] [address]  Urine culture     Status: Abnormal   Collection Time: 12/26/18  4:17 PM   Specimen: In/Out Cath Urine  Result Value Ref Range Status   Specimen Description IN/OUT CATH URINE  Final   Special Requests   Final    NONE Performed at [facility], [address] [place], [place] [address]    Culture >=100,000  COLONIES/mL ESCHERICHIA COLI (A)  Final   Report Status 12/28/2018 FINAL  Final   Organism ID, Bacteria ESCHERICHIA COLI (A)  Final      Susceptibility   Escherichia coli - MIC*    AMPICILLIN 4 SENSITIVE Sensitive     CEFAZOLIN <=4 SENSITIVE Sensitive     CEFTRIAXONE <=1 SENSITIVE Sensitive     CIPROFLOXACIN <=0.25 SENSITIVE Sensitive     GENTAMICIN <=1 SENSITIVE Sensitive     IMIPENEM <=0.25 SENSITIVE Sensitive     NITROFURANTOIN <=16 SENSITIVE Sensitive     TRIMETH/SULFA <=20 SENSITIVE Sensitive     AMPICILLIN/SULBACTAM <=2 SENSITIVE Sensitive     PIP/TAZO <=4 SENSITIVE Sensitive     Extended ESBL NEGATIVE Sensitive     * >=100,000 COLONIES/mL ESCHERICHIA COLI  SARS CORONAVIRUS 2 (TAT 6-24 HRS) Nasopharyngeal Nasopharyngeal Swab     Status: None   Collection Time: 12/26/18  4:35 PM   Specimen: Nasopharyngeal Swab  Result Value Ref Range Status   SARS Coronavirus 2 NEGATIVE NEGATIVE Final    Comment: (NOTE) SARS-CoV-2 target nucleic acids are NOT DETECTED. The SARS-CoV-2 RNA is generally detectable in upper and lower respiratory specimens during the acute phase of infection. Negative results do not preclude SARS-CoV-2 infection, do not rule out co-infections with other pathogens, and should not be used as the sole basis for treatment or other patient management decisions. Negative results must be combined with clinical observations, patient history, and epidemiological information. The expected result is Negative. Fact Sheet for Patients: SugarRoll.be Fact Sheet for Healthcare Providers: https://www.woods-mathews.com/ This test is not yet approved or cleared by the [place] FDA and  has been authorized for detection and/or diagnosis of SARS-CoV-2 by FDA under an Emergency Use Authorization (EUA). This EUA will remain  in effect (meaning this test can be used) for the duration of the COVID-19 declaration under Section 56 4(b)(1) of  the Act, 21 U.S.C. section 360bbb-3(b)(1), unless the authorization is terminated or revoked sooner. Performed at [facility], [place] [address], [place], [place] [address]   Culture, blood (routine x 2)     Status: None (Preliminary result)   Collection Time: 12/29/18 12:27 PM   Specimen: BLOOD  Result Value Ref Range Status   Specimen Description BLOOD RIGHT ANTECUBITAL  Final   Special Requests   Final    BOTTLES DRAWN AEROBIC ONLY Blood Culture adequate volume   Culture   Final    NO GROWTH < 24 HOURS Performed at [facility], [place] [address], [place], [place] [address]    Report Status PENDING  Incomplete  Culture, blood (routine x 2)     Status: None (Preliminary result)   Collection Time: 12/29/18 12:33 PM   Specimen: BLOOD RIGHT HAND  Result Value Ref Range Status   Specimen Description BLOOD RIGHT HAND  Final   Special Requests   Final    BOTTLES DRAWN AEROBIC ONLY Blood Culture adequate volume   Culture   Final    NO GROWTH < 24 HOURS Performed at [facility], [place] [address], [place], [place] [address]    Report Status PENDING  Incomplete      Radiology Studies: X-ray  Chest Pa And Lateral  Result Date: 12/29/2018 CLINICAL DATA:  Some shortness of breath. EXAM: CHEST - 2 VIEW COMPARISON:  12/26/2018. FINDINGS: Cardiac silhouette mildly enlarged. No mediastinal or hilar masses. No evidence of adenopathy. Small bilateral pleural effusions. Lungs otherwise clear. No pneumothorax. Surgical vascular clips are noted in the epigastric region. Skeletal structures are grossly intact. IMPRESSION: 1. No acute cardiopulmonary disease. 2. Mild cardiomegaly.  Small pleural effusions. Electronically Signed   By: Lajean Manes M.D.   On: 12/29/2018 14:21      Scheduled Meds: . acidophilus  1 capsule Oral Daily  . apixaban  2.5 mg Oral BID  . atenolol  25 mg Oral Daily  . atorvastatin  20 mg Oral q1800  . buPROPion  150 mg Oral Daily  . cholecalciferol  2,000  Units Oral Daily  . dofetilide  125 mcg Oral BID  . fluticasone  2 spray Each Nare Daily  . fluticasone  1 puff Inhalation BID  . furosemide  40 mg Intravenous Once  . pantoprazole  40 mg Oral Daily  . potassium chloride  20 mEq Oral Once  . sodium chloride flush  3 mL Intravenous Q12H   Continuous Infusions: . sodium chloride    . cefTRIAXone (ROCEPHIN)  IV 2 g (12/29/18 1555)     LOS: 4 days      Time spent: 25 minutes   Dessa Phi, DO [facility] 12/30/2018, 10:34 AM   Available via Epic secure chat 7am-7pm After these hours, please refer to coverage provider listed on [email or web address]

## 2018-12-30 NOTE — Progress Notes (Addendum)
Patient ID: [name] Spence, MD, male   DOB: [birth year], 85 y.o.   MRN: [id number]     Advanced Heart Failure Rounding Note  PCP-Cardiologist: No primary care provider on file.   Subjective:    Less wheezy today.  HR 100s-110s atrial fibrillation.  BP stable but has not been out of bed.    CXR: Bilateral small pleural effusions. BNP 625. Creatinine trending down.    Objective:   Weight Range: 72.8 kg Body mass index is 23.7 kg/m.   Vital Signs:   Temp:  [98.1 F (36.7 C)-98.4 F (36.9 C)] 98.1 F (36.7 C) (11/21 0445) Pulse Rate:  [73-109] 98 (11/21 1000) Resp:  [18-20] 18 (11/21 1000) BP: (109-130)/(69-94) 130/82 (11/21 1000) SpO2:  [95 %-99 %] 99 % (11/21 1000) Last BM Date: 12/28/18  Weight change: Filed Weights   12/26/18 1813  Weight: 72.8 kg    Intake/Output:   Intake/Output Summary (Last 24 hours) at 12/30/2018 1010 Last data filed at 12/30/2018 0600 Gross per 24 hour  Intake 1184.54 ml  Output 925 ml  Net 259.54 ml      Physical Exam    General:  Well appearing. No resp difficulty HEENT: Normal Neck: Supple. JVP 9-10 cm. Carotids 2+ bilat; no bruits. No lymphadenopathy or thyromegaly appreciated. Cor: PMI nondisplaced. Mildly tachy, irregular rate & rhythm. No rubs, gallops. 1/6 HSM apex.  Lungs: Soft end expiratory wheezes.  Abdomen: Soft, nontender, nondistended. No hepatosplenomegaly. No bruits or masses. Good bowel sounds. Extremities: No cyanosis, clubbing, rash, edema Neuro: Alert & orientedx3, cranial nerves grossly intact. moves all 4 extremities w/o difficulty. Affect pleasant   Telemetry   Atrial fibrillation 100s-110s (personally reviewed).   Labs    CBC Recent Labs    12/28/18 0533 12/29/18 0649 12/30/18 0451  WBC 9.4 8.9 10.5  NEUTROABS 8.4* 7.3  --   HGB 10.5* 10.9* 10.4*  HCT 32.0* 32.9* 32.0*  MCV 90.9 90.4 92.0  PLT 60* 70* 76*   Basic Metabolic Panel Recent Labs    12/29/18 0649 12/30/18 0451  NA 135 133*  K  3.9 4.2  CL 108 106  CO2 17* 17*  GLUCOSE 111* 116*  BUN 42* 34*  CREATININE 1.48* 1.28*  CALCIUM 8.1* 8.1*  MG 1.8 1.6*   Liver Function Tests Recent Labs    12/28/18 0533  AST 47*  ALT 36  ALKPHOS 66  BILITOT 0.6  PROT 4.9*  ALBUMIN 2.1*   No results for input(s): LIPASE, AMYLASE in the last 72 hours. Cardiac Enzymes No results for input(s): CKTOTAL, CKMB, CKMBINDEX, TROPONINI in the last 72 hours.  BNP: BNP (last 3 results) Recent Labs    12/26/18 1552 12/29/18 0649  BNP 334.3* 625.3*    ProBNP (last 3 results) No results for input(s): PROBNP in the last 8760 hours.   D-Dimer No results for input(s): DDIMER in the last 72 hours. Hemoglobin A1C No results for input(s): HGBA1C in the last 72 hours. Fasting Lipid Panel No results for input(s): CHOL, HDL, LDLCALC, TRIG, CHOLHDL, LDLDIRECT in the last 72 hours. Thyroid Function Tests No results for input(s): TSH, T4TOTAL, T3FREE, THYROIDAB in the last 72 hours.  Invalid input(s): FREET3  Other results:   Imaging    X-ray Chest Pa And Lateral  Result Date: 12/29/2018 CLINICAL DATA:  Some shortness of breath. EXAM: CHEST - 2 VIEW COMPARISON:  12/26/2018. FINDINGS: Cardiac silhouette mildly enlarged. No mediastinal or hilar masses. No evidence of adenopathy. Small bilateral pleural effusions. Lungs otherwise  clear. No pneumothorax. Surgical vascular clips are noted in the epigastric region. Skeletal structures are grossly intact. IMPRESSION: 1. No acute cardiopulmonary disease. 2. Mild cardiomegaly.  Small pleural effusions. Electronically Signed   By: Lajean Manes M.D.   On: 12/29/2018 14:21      Medications:     Scheduled Medications: . acidophilus  1 capsule Oral Daily  . apixaban  2.5 mg Oral BID  . atenolol  25 mg Oral Daily  . atorvastatin  20 mg Oral q1800  . buPROPion  150 mg Oral Daily  . cholecalciferol  2,000 Units Oral Daily  . dofetilide  125 mcg Oral BID  . fluticasone  2 spray Each  Nare Daily  . fluticasone  1 puff Inhalation BID  . furosemide  40 mg Intravenous Once  . pantoprazole  40 mg Oral Daily  . potassium chloride  20 mEq Oral Once  . sodium chloride flush  3 mL Intravenous Q12H     Infusions: . sodium chloride    . cefTRIAXone (ROCEPHIN)  IV 2 g (12/29/18 1555)     PRN Medications:  sodium chloride, diltiazem, ipratropium-albuterol, LORazepam, polyvinyl alcohol, sodium chloride flush, traMADol    Assessment/Plan   1. UTI/pyelonephritis with septic shock: Improved, cultures showed E coli and he is on ceftriaxone.  2. Asthma: Less wheezing today.  Has history of asthma but suspect volume overload contributes.   - Can change Duonebs from standing to prn with elevated HR today.  3. CAD:No chest pain. - He is on apixaban without ASA given stable CAD.  - Continue statin, beta blocker.  4. Acute on chronic diastolic [id number] recent echo in 8/19 showed EF 123456 but PA systolic pressure was estimated at 60 mmHg and there was severe TR (progressed from moderate), the RV looked normal. I suspect that he has a degree of RV failure.  He has been off Lasix and getting IV fluid with episode of septic shock.  BP stable in bed and creatinine improving, down to 1.28.  He is wheezing diffusely but less than yesterday.  Has history of asthma that may play a role, but BNP elevated and small effusions on CXR.  Suspect that he has developed some volume overload.   - Lasix 40 mg IV x 1 and follow response.   - Needs strict I/Os, daily weights.  5. Atrial fibrillation: Paroxysmal. Episodes are quite symptomatic, he feels "bad" with atrial fibrillation even when he's rate-controlled. He had frequent breakthrough on Multaq but has seemed to be doing better on Tikosyn.  He is currently in atrial fibrillation, looks like it was probably triggered by current illness. HR elevated today compared to yesterday, may be related to albuterol with Duonebs. QTc was ok on  yesterday's ECG.  - As above, will make Duonebs prn.  - Tikosyn decreased to 125 mcg bid with AKI this admission.  Hopefully can go back to 250 mcg bid prior to discharge.  - Eliquis decreased to 2.5 mg bid (elevated creatinine + age > 50).  - Continue atenolol for rate control.  If HR stays up, may need diltiazem CD added.  6. AKI on CKD: Creatinine > 2 at admission in setting of septic shock.  Resolving, creatinine 1.28 today.  7. Thrombocytopenia: Platelets trending up, 76K now.  May have been low due to sepsis.   Need to mobilize, make sure orthostasis has resolved. PT/OT, cardiac rehab.   Length of Stay: [place], MD  12/30/2018, 10:10 AM  Advanced Heart  Failure Team Pager [phone] (M-F; 7a - 4p)  Please contact [facility] for night-coverage after hours (4p -7a ) and weekends on [email or web address]

## 2018-12-30 NOTE — Progress Notes (Signed)
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  He was found to be hypotensive in the 70s    PT Comments    Pt with PT orders, although was already on caseload. Pt able to increase ambulation distance today with RW.  Con't to recommend HHPT and support of wife.  Follow Up Recommendations  Home health PT;Supervision/Assistance - 24 hour     Equipment Recommendations  None recommended by PT    Recommendations for Other Services       Precautions / Restrictions Precautions Precautions: Fall Precaution Comments: watch BP Restrictions Weight Bearing Restrictions: No    Mobility  Bed Mobility Overal bed mobility: Modified Independent Bed Mobility: Supine to Sit     Supine to sit: Modified independent (Device/Increase time);HOB elevated     General bed mobility comments: Pt moved quickly to EOB  Transfers Overall transfer level: Needs assistance Equipment used: Rolling walker (2 wheeled) Transfers: Sit to/from Stand Sit to Stand: Min guard         General transfer comment: cues for hand placement as pt wants to pull up on RW  Ambulation/Gait Ambulation/Gait assistance: Min guard Gait Distance (Feet): 175 Feet Assistive device: Rolling walker (2 wheeled) Gait Pattern/deviations: Step-through pattern;Trunk flexed     General Gait Details: Pt with quick unsafe turns initially, but improved with cueing for speed and to keep RW on the ground.  No c/o dizziness. Pt with wheezing noted and o2 checked and 99% on RA, HR 92-97 bpm BP 115/68 after  gait   Stairs             Wheelchair Mobility    Modified Rankin (Stroke Patients Only)       Balance Overall balance assessment: Needs assistance Sitting-balance support: Feet supported;Bilateral upper extremity supported Sitting balance-Leahy Scale: Good     Standing balance support: Bilateral upper extremity supported Standing balance-Leahy Scale: Fair Standing balance comment: needs UE for safe dynamic movement                            Cognition Arousal/Alertness: Awake/alert Behavior During Therapy: WFL for tasks assessed/performed Overall Cognitive Status: Within Functional Limits for tasks assessed                                 General Comments: patient now with hearing aides, very pleasant and cooperative      Exercises      General Comments        Pertinent Vitals/Pain Pain Assessment: No/denies pain    Home Living                      Prior Function            PT Goals (current goals can now be found in the care plan section) Acute Rehab PT Goals Potential to Achieve Goals: Good Progress towards PT goals: Progressing toward goals    Frequency    Min 3X/week      PT Plan Current plan remains appropriate    Co-evaluation  AM-PAC PT "6 Clicks" Mobility   Outcome Measure  Help needed turning from your back to your side while in a flat bed without using bedrails?: None Help needed moving from lying on your back to sitting on the side of a flat bed without using bedrails?: None Help needed moving to and from a bed to a chair (including a wheelchair)?: A Little Help needed standing up from a chair using your arms (e.g., wheelchair or bedside chair)?: A Little Help needed to walk in hospital room?: A Little Help needed climbing 3-5 steps with a railing? : A Little 6 Click Score: 20    End of Session Equipment Utilized During Treatment: Gait belt Activity Tolerance: Patient  tolerated treatment well Patient left: in chair;with family/visitor present;Other (comment)(OT) Nurse Communication: Mobility status PT Visit Diagnosis: Other abnormalities of gait and mobility (R26.89);Muscle weakness (generalized) (M62.81)     Time: [id number] PT Time Calculation (min) (ACUTE ONLY): 26 min  Charges:  $Gait Training: 8-22 mins $Therapeutic Activity: 8-22 mins                     Srihitha Tagliaferri L. Tamala Julian, [place] Pager [phone] 12/30/2018    [name] [name] 12/30/2018, 4:01 PM

## 2018-12-30 NOTE — Progress Notes (Signed)
Occupational Therapy Evaluation Patient Details Name: JAVONTA WIGMORE, MD MRN: [id number] DOB: 1929/03/21 Today's Date: 12/30/2018    History of Present Illness [name], MD is a 85 y.o. male with medical history significant of asthma, coronary artery disease,, paroxysmal A fibrillation, hypertension, congestive heart failure diastolic dysfunction, chronic kidney disease stage II, hypercholesterolemia, prostate cancer came with a chief complaint of generalized weakness.  He was found to be hypotensive in the 70s   Clinical Impression   PTA, pt was living with wife on main floor of multi-level home, reports independence with all BADLs and IADLs; per pt and wife, pt was golfing several times a week. Pt currently presenting with decreased balance, activity tolerance, and strength. Pt performing grooming tasks at sink, LB dressing, functional mobility with min guard A for safety and balance. Pt had episode of LOB during grooming, required min A to regain balance. Pt would benefit from continued OT acutely to address deficits and facilitate safe dc. Recommend dc with HHOT to increased safe performance of ADLs.     Follow Up Recommendations  Home health OT;Supervision/Assistance - 24 hour    Equipment Recommendations  3 in 1 bedside commode    Recommendations for Other Services PT consult     Precautions / Restrictions Precautions Precautions: Fall Precaution Comments: watch BP Restrictions Weight Bearing Restrictions: No      Mobility Bed Mobility Overal bed mobility: Modified Independent Bed Mobility: Supine to Sit     Supine to sit: Modified independent (Device/Increase time);HOB elevated     General bed mobility comments: pt sitting in recliner upon arrival  Transfers Overall transfer level: Needs assistance Equipment used: Rolling walker (2 wheeled) Transfers: Sit to/from Stand Sit to Stand: Min guard         General transfer comment: pt requires min guard A for  safety and balance    Balance Overall balance assessment: Needs assistance Sitting-balance support: Feet supported;Bilateral upper extremity supported Sitting balance-Leahy Scale: Good   Postural control: Posterior lean Standing balance support: Bilateral upper extremity supported Standing balance-Leahy Scale: Fair Standing balance comment: Pt had episode of LOB at sink, required min A to correct balance                           ADL either performed or assessed with clinical judgement   ADL Overall ADL's : Needs assistance/impaired Eating/Feeding: Modified independent;Sitting   Grooming: Wash/dry hands;Brushing hair;Min guard;Minimal assistance;Standing Grooming Details (indicate cue type and reason): Pt performed face washing and brushing hair with min guard A, pt had episodes of LOB standing at sink, requiring min A to correct balance. Upper Body Bathing: Min guard;Sitting   Lower Body Bathing: Min guard;Sit to/from stand   Upper Body Dressing : Supervision/safety;Sitting   Lower Body Dressing: Min guard;Sit to/from stand Lower Body Dressing Details (indicate cue type and reason): Pt demonstrated figure 4 method to adjust socks. required min guard A for sit<>stand Toilet Transfer: Min guard;Ambulation   Toileting- Clothing Manipulation and Hygiene: Min guard;Sit to/from stand       Functional mobility during ADLs: Min guard(HHA) General ADL Comments: Pt performed most ADLs with min guard A for safety and balance. Pt performed brushing hair and face washing at sink with min guard A. Pt had episodes of LOB at sink, requiring min A to correct balance. During functional mobility, pt required HHA and was furniture walking to and from the sink.      Vision  Perception     Praxis      Pertinent Vitals/Pain Pain Assessment: No/denies pain     Hand Dominance Right   Extremity/Trunk Assessment Upper Extremity Assessment Upper Extremity Assessment:  Overall WFL for tasks assessed   Lower Extremity Assessment Lower Extremity Assessment: Defer to PT evaluation   Cervical / Trunk Assessment Cervical / Trunk Assessment: Kyphotic   Communication Communication Communication: HOH   Cognition Arousal/Alertness: Awake/alert Behavior During Therapy: WFL for tasks assessed/performed Overall Cognitive Status: Within Functional Limits for tasks assessed                                 General Comments: Pt answered all questions appropriately   General Comments  VSS    Exercises     Shoulder Instructions      Home Living Family/patient expects to be discharged to:: Private residence Living Arrangements: Spouse/significant other Available Help at Discharge: Family;Available 24 hours/day Type of Home: House Home Access: Stairs to enter [employer] of Steps: 2 Entrance Stairs-Rails: Can reach both Home Layout: Multi-level;Able to live on main level with bedroom/bathroom     Bathroom Shower/Tub: Occupational psychologist: Standard Bathroom Accessibility: Yes   Home Equipment: Grab bars - tub/shower;Shower seat - built in          Prior Functioning/Environment Level of Independence: Independent        Comments: played golf last week        OT Problem List: Decreased strength;Decreased activity tolerance;Impaired balance (sitting and/or standing);Decreased safety awareness;Decreased knowledge of use of DME or AE      OT Treatment/Interventions: Self-care/ADL training;DME and/or AE instruction;Therapeutic activities;Patient/family education;Balance training    OT Goals(Current goals can be found in the care plan section) Acute Rehab OT Goals Patient Stated Goal: go home OT Goal Formulation: With patient Time For Goal Achievement: 01/13/19 Potential to Achieve Goals: Good  OT Frequency: Min 3X/week   Barriers to D/C:            Co-evaluation              AM-PAC OT "6  Clicks" Daily Activity     Outcome Measure Help from another person eating meals?: None Help from another person taking care of personal grooming?: A Little Help from another person toileting, which includes using toliet, bedpan, or urinal?: A Little Help from another person bathing (including washing, rinsing, drying)?: A Little Help from another person to put on and taking off regular upper body clothing?: None Help from another person to put on and taking off regular lower body clothing?: A Little 6 Click Score: 20   End of Session Equipment Utilized During Treatment: Gait belt Nurse Communication: Mobility status  Activity Tolerance: Patient tolerated treatment well Patient left: in chair;with call bell/phone within reach;with chair alarm set;with family/visitor present  OT Visit Diagnosis: Unsteadiness on feet (R26.81);Other abnormalities of gait and mobility (R26.89);Repeated falls (R29.6)                Time: [id number] OT Time Calculation (min): 18 min Charges:  OT General Charges $OT Visit: 1 Visit OT Evaluation $OT Eval Moderate Complexity: [place], OT Student  Gus Rankin 12/30/2018, 4:53 PM

## 2018-12-31 LAB — CBC
HCT: 30.6 % — ABNORMAL LOW (ref 39.0–52.0)
Hemoglobin: 10.3 g/dL — ABNORMAL LOW (ref 13.0–17.0)
MCH: 30.2 pg (ref 26.0–34.0)
MCHC: 33.7 g/dL (ref 30.0–36.0)
MCV: 89.7 fL (ref 80.0–100.0)
Platelets: 118 10*3/uL — ABNORMAL LOW (ref 150–400)
RBC: 3.41 MIL/uL — ABNORMAL LOW (ref 4.22–5.81)
RDW: 15.1 % (ref 11.5–15.5)
WBC: 12.6 10*3/uL — ABNORMAL HIGH (ref 4.0–10.5)
nRBC: 0 % (ref 0.0–0.2)

## 2018-12-31 LAB — BASIC METABOLIC PANEL
Anion gap: 7 (ref 5–15)
BUN: 33 mg/dL — ABNORMAL HIGH (ref 8–23)
CO2: 22 mmol/L (ref 22–32)
Calcium: 8.1 mg/dL — ABNORMAL LOW (ref 8.9–10.3)
Chloride: 107 mmol/L (ref 98–111)
Creatinine, Ser: 1.24 mg/dL (ref 0.61–1.24)
GFR calc Af Amer: >60 mL/min (ref >60)
GFR calc non Af Amer: 52 mL/min — ABNORMAL LOW (ref >60)
Glucose, Bld: 121 mg/dL — ABNORMAL HIGH (ref 70–99)
Potassium: 3.4 mmol/L — ABNORMAL LOW (ref 3.5–5.1)
Sodium: 136 mmol/L (ref 135–145)

## 2018-12-31 LAB — MAGNESIUM: Magnesium: 1.6 mg/dL — ABNORMAL LOW (ref 1.7–2.4)

## 2018-12-31 MED ORDER — CEPHALEXIN 500 MG PO CAPS: 500.0000 mg | ORAL_CAPSULE | Freq: Four times a day (QID) | ORAL | 0 refills | Status: AC

## 2018-12-31 MED ORDER — POTASSIUM CHLORIDE CRYS ER 20 MEQ PO TBCR
30.0000 meq | EXTENDED_RELEASE_TABLET | Freq: Two times a day (BID) | ORAL | Status: DC
  Administered 2018-12-31: 30 meq via ORAL
  Filled 2018-12-31: qty 1

## 2018-12-31 MED ORDER — POTASSIUM CHLORIDE ER 10 MEQ PO CPCR: ORAL_CAPSULE | ORAL | 0 refills | Status: DC

## 2018-12-31 MED ORDER — MAGNESIUM SULFATE 2 GM/50ML IV SOLN
2.0000 g | Freq: Once | INTRAVENOUS | Status: AC
  Administered 2018-12-31: 2 g via INTRAVENOUS
  Filled 2018-12-31: qty 50

## 2018-12-31 MED ORDER — FUROSEMIDE 20 MG PO TABS
20.0000 mg | ORAL_TABLET | Freq: Every day | ORAL | Status: DC
  Administered 2018-12-31: 20 mg via ORAL
  Filled 2018-12-31: qty 1

## 2018-12-31 MED ORDER — FUROSEMIDE 20 MG PO TABS: ORAL_TABLET | ORAL | 0 refills | Status: DC

## 2018-12-31 MED ORDER — APIXABAN 5 MG PO TABS
5.0000 mg | ORAL_TABLET | Freq: Two times a day (BID) | ORAL | Status: DC
  Administered 2018-12-31: 5 mg via ORAL
  Filled 2018-12-31: qty 1

## 2018-12-31 MED ORDER — DOFETILIDE 250 MCG PO CAPS
250.0000 ug | ORAL_CAPSULE | Freq: Two times a day (BID) | ORAL | Status: DC
  Administered 2018-12-31: 250 ug via ORAL
  Filled 2018-12-31 (×2): qty 1

## 2018-12-31 NOTE — Progress Notes (Signed)
Patient ID: [name] Spence, MD, male   DOB: 05-31-1929, 85 y.o.   MRN: [id number]     Advanced Heart Failure Rounding Note  PCP-Cardiologist: No primary care provider on file.   Subjective:    He diuresed well yesterday, breathing is better.  Wheezes not audible today.  HR 80s-90s atrial fibrillation.  BP stable.   Creatinine stable 1.24.    ECG today with QTc 425 msec   Objective:   Weight Range: 75.5 kg Body mass index is 24.58 kg/m.   Vital Signs:   Temp:  [98 F (36.7 C)-98.9 F (37.2 C)] 98.6 F (37 C) (11/22 0932) Pulse Rate:  [72-88] 78 (11/22 0932) Resp:  [16-18] 18 (11/22 0932) BP: (106-124)/(71-78) 114/71 (11/22 0932) SpO2:  [97 %-98 %] 97 % (11/22 0932) Last BM Date: 12/28/18  Weight change: Filed Weights   12/26/18 1813 12/30/18 1100  Weight: 72.8 kg 75.5 kg    Intake/Output:   Intake/Output Summary (Last 24 hours) at 12/31/2018 1140 Last data filed at 12/31/2018 0847 Gross per 24 hour  Intake 900 ml  Output 3500 ml  Net -2600 ml      Physical Exam    General: NAD Neck: JVP 8 cm, no thyromegaly or thyroid nodule.  Lungs: end expiratory wheezes, less prominent than yesterday CV: Nondisplaced PMI.  Heart irregular S1/S2, no S3/S4, no murmur.  No peripheral edema.   Abdomen: Soft, nontender, no hepatosplenomegaly, no distention.  Skin: Intact without lesions or rashes.  Neurologic: Alert and oriented x 3.  Psych: Normal affect. Extremities: No clubbing or cyanosis.  HEENT: Normal.    Telemetry   Atrial fibrillation 80s-90s (personally reviewed).   Labs    CBC Recent Labs    12/29/18 0649 12/30/18 0451 12/31/18 0512  WBC 8.9 10.5 12.6*  NEUTROABS 7.3  --   --   HGB 10.9* 10.4* 10.3*  HCT 32.9* 32.0* 30.6*  MCV 90.4 92.0 89.7  PLT 70* 76* 123456*   Basic Metabolic Panel Recent Labs    12/30/18 0451 12/31/18 0512  NA 133* 136  K 4.2 3.4*  CL 106 107  CO2 17* 22  GLUCOSE 116* 121*  BUN 34* 33*  CREATININE 1.28* 1.24   CALCIUM 8.1* 8.1*  MG 1.6* 1.6*   Liver Function Tests No results for input(s): AST, ALT, ALKPHOS, BILITOT, PROT, ALBUMIN in the last 72 hours. No results for input(s): LIPASE, AMYLASE in the last 72 hours. Cardiac Enzymes No results for input(s): CKTOTAL, CKMB, CKMBINDEX, TROPONINI in the last 72 hours.  BNP: BNP (last 3 results) Recent Labs    12/26/18 1552 12/29/18 0649  BNP 334.3* 625.3*    ProBNP (last 3 results) No results for input(s): PROBNP in the last 8760 hours.   D-Dimer No results for input(s): DDIMER in the last 72 hours. Hemoglobin A1C No results for input(s): HGBA1C in the last 72 hours. Fasting Lipid Panel No results for input(s): CHOL, HDL, LDLCALC, TRIG, CHOLHDL, LDLDIRECT in the last 72 hours. Thyroid Function Tests No results for input(s): TSH, T4TOTAL, T3FREE, THYROIDAB in the last 72 hours.  Invalid input(s): FREET3  Other results:   Imaging    No results found.   Medications:     Scheduled Medications: . acidophilus  1 capsule Oral Daily  . apixaban  5 mg Oral BID  . atenolol  25 mg Oral Daily  . atorvastatin  20 mg Oral q1800  . buPROPion  150 mg Oral Daily  . cholecalciferol  2,000 Units Oral  Daily  . dofetilide  250 mcg Oral BID  . fluticasone  2 spray Each Nare Daily  . fluticasone  1 puff Inhalation BID  . pantoprazole  40 mg Oral Daily  . potassium chloride  30 mEq Oral BID WC  . sodium chloride flush  3 mL Intravenous Q12H    Infusions: . sodium chloride    . cefTRIAXone (ROCEPHIN)  IV 2 g (12/30/18 1629)    PRN Medications: sodium chloride, diltiazem, ipratropium-albuterol, LORazepam, polyvinyl alcohol, sodium chloride flush, traMADol    Assessment/Plan   1. UTI/pyelonephritis with septic shock: Improved, cultures showed E coli and he is on ceftriaxone.  2. Asthma: Less wheezing today.  Has history of asthma but suspect volume overload contributes.   3. CAD:No chest pain. - He is on apixaban without ASA  given stable CAD.  - Continue statin, beta blocker.  4. Acute on chronic diastolic [id number] recent echo in 8/19 showed EF 123456 but PA systolic pressure was estimated at 60 mmHg and there was severe TR (progressed from moderate), the RV looked normal. I suspect that he has a degree of RV failure.  He got IV fluid initially with episode of septic shock.  I think he developed some volume overload, diuresed well with 1 dose of IV Lasix yesterday, breathing improved.  On exam, suspect residual mild volume overload but better.  - Lasix 20 mg daily x 3 days, then take Lasix 20 mg every other day after that.   5. Atrial fibrillation: Paroxysmal. Episodes are quite symptomatic, he feels "bad" with atrial fibrillation even when he's rate-controlled. He had frequent breakthrough on Multaq but has seemed to be doing better on Tikosyn.  He is currently in atrial fibrillation, looks like it was probably triggered by current illness. HR controlled, QTc 425 on today's ECG.   - He is back on his home Tikosyn dose, 250 mcg bid.  - Eliquis decreased to 2.5 mg bid (elevated creatinine + age > 20).  - Continue atenolol for rate control.   - Will see him in clinic in a week or 2, if he stays in atrial fibrillation, will need DCCV.  6. AKI on CKD: Creatinine > 2 at admission in setting of septic shock.  Resolving, creatinine 1.24 today.  7. Thrombocytopenia: Platelets trending up, 118K now.  May have been low due to sepsis.   He can go home from my standpoint.  Will need home health/PT.  Will need followup in CHF clinic in 10 days with ECG.  meds for home: Tikosyn 250 mcg bid, Eliquis 2.5 mg bid, Lasix 20 mg daily + KCl 10 mEq daily x 3 days then Lasix 20 mg every other day and KCl 10 mEq every other day after that, atenolol 25 mg daily.   Length of Stay: [place], MD  12/31/2018, 11:40 AM  Advanced Heart Failure Team Pager [phone] (M-F; [place])  Please contact [facility] for night-coverage after  hours (4p -7a ) and weekends on [email or web address]

## 2018-12-31 NOTE — Discharge Summary (Signed)
Physician Discharge Summary  [name] Spence, MD [id number] DOB: 06-12-1929 DOA: 12/26/2018  PCP: [name], MD  Admit date: 12/26/2018 Discharge date: 12/31/2018  Admitted From: Home Disposition:  Home with Home health   Recommendations for Outpatient Follow-up:  1. Follow up with PCP in 1 week 2. Follow up with CHF clinic in 10 days with ECG.   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 Patient states that he had a possible Covid exposure about a week ago when he was playing golf with a friend. The whole week he was tired and weak. Today he was weak and slid down from his chair to the floor. He was not not able to get up. He came to emergency room when he was found to be hypotensive in the 70s per EMS. Covid test resulted negative. In the emergency room, he was found hypotensive with sepsis criteria secondary to UTI, CT abdomen and pelvis show perinephritic stranding both kidney more on the right. Lactic acid 3.1. Receive IV fluids per sepsis protocol and was started on cefepime and vancomycin, deescalated to rocephin. Per patient's request, advanced heart failure team also evaluated patient during hospitalization.   On day of discharge, he remained afebrile, feeling better, breathing well without concerns. All questions and concerns addressed with patient's wife prior to discharge.   Discharge Diagnoses:  Principal Problem:   Sepsis ([place]) Active Problems:   Hyperlipidemia   Anxiety state   Essential hypertension   CAD S/P OM PCI 2010   Mild persistent asthma without complication   GERD    Paroxysmal atrial fibrillation (HCC)   Abnormality of gait   Acute lower UTI   Congestive heart failure with left ventricular diastolic dysfunction (HCC)   Acute kidney injury superimposed on CKD ([place])   Bacteremia   Abnormal x-ray of pelvis 11/17   Pyelonephritis   Metabolic acidosis    Severe sepsis secondary to E Coli UTI, pyelonephritis, bacteremia -Renal CT withbladder wall thickening and probable outlet obstruction due to prostatomegaly -Blood culture obtained 11/17 pansensitive E Coli  -Urine culture obtained 11/17 pansensitive E Coli -Rocephin --> Keflex  -Repeat blood culture 11/20 negative to date   Acute kidney injury superimposed CKD II -Creatinine continues to improve, monitor BMP   Orthostatic hypotension -120/79 supine --> 79/57 standing at 3 minutes on 11/19  -Repeat orthostatic 113/94 supine --> 101/72 on 11/20  -Repeat orthostatic 109/54 supine --> 103/57 on 11/22   Acute on chronic diastolic heart failure -Echo 09/2017 with EF 60% and grade 1 diastolic dysfunction -Wife reports lasix stopped last week as he was "so weak" -IV lasix one time ordered, discharged on PO lasix + KCl  -Appreciate advanced heart failure service  Paroxysmal Afib -Resume home Eliquis, atenolol, Tikosyn  -Continue telemetry  Abnormal xray of pelvis -Hx recent fall. Pelvic xray with small osseous fragment seen adjacent to the inferior right femoral head which could represent a small chip fracture -PT evaluation commending home health  Mild persistent asthma without complication -Continue duo nebs, flutter valve   Anxiety -Continue wellbutrin   CAD s/p PCI 2010 -No chest pain -Continue lipitor   Hypokalemia -Replaced  Hypomagnesemia -Replaced   Discharge Instructions  Discharge Instructions    (  HEART FAILURE PATIENTS) Call MD:  Anytime you have any of the following symptoms: 1) 3 pound weight gain in 24 hours or 5 pounds in 1 week 2) shortness of breath, with  or without a dry hacking cough 3) swelling in the hands, feet or stomach 4) if you have to sleep on extra pillows at night in order to breathe.   Complete by: As directed    Call MD for:  difficulty breathing, headache or visual disturbances   Complete by: As directed    Call MD for:  extreme fatigue   Complete by: As directed    Call MD for:  persistant dizziness or light-headedness   Complete by: As directed    Call MD for:  persistant nausea and vomiting   Complete by: As directed    Call MD for:  severe uncontrolled pain   Complete by: As directed    Call MD for:  temperature >100.4   Complete by: As directed    Diet - low sodium heart healthy   Complete by: As directed    Discharge instructions   Complete by: As directed    You were cared for by a hospitalist during your hospital stay. If you have any questions about your discharge medications or the care you received while you were in the hospital after you are discharged, you can call the unit and ask to speak with the hospitalist on call if the hospitalist that took care of you is not available. Once you are discharged, your primary care physician will handle any further medical issues. Please note that NO REFILLS for any discharge medications will be authorized once you are discharged, as it is imperative that you return to your primary care physician (or establish a relationship with a primary care physician if you do not have one) for your aftercare needs so that they can reassess your need for medications and monitor your lab values.   Increase activity slowly   Complete by: As directed      Allergies as of 12/31/2018      Reactions   Tylenol [acetaminophen] Rash   Azithromycin    Advil [ibuprofen] Nausea Only, Rash   Asa [aspirin] Rash      Medication List    TAKE these medications   apixaban 5 MG Tabs tablet Commonly known as: Eliquis Take 1 tablet (5 mg total) by mouth 2 (two) times daily. Needs apt for further  refills   Arnuity Ellipta 200 MCG/ACT Aepb Generic drug: Fluticasone Furoate Inhale 1 puff into the lungs daily.   atenolol 25 MG tablet Commonly known as: TENORMIN TAKE 1 TABLET BY MOUTH DAILY.   atorvastatin 20 MG tablet Commonly known as: LIPITOR TAKE 1 TABLET(20 MG) BY MOUTH DAILY What changed: See the new instructions.   buPROPion 150 MG 12 hr tablet Commonly known as: WELLBUTRIN SR Take 1 tablet (150 mg total) by mouth daily.   cephALEXin 500 MG capsule Commonly known as: KEFLEX Take 1 capsule (500 mg total) by mouth 4 (four) times daily for 2 days.   cetirizine 10 MG tablet Commonly known as: ZYRTEC Take 10 mg by mouth daily with supper.   diltiazem 30 MG tablet Commonly known as: CARDIZEM Take 30 mg by mouth as needed.   dofetilide 250 MCG capsule Commonly known as: TIKOSYN Take 1 capsule (250 mcg total) by mouth 2 (two) times daily. Needs appt for further refills   fluticasone 50 MCG/ACT nasal spray Commonly known as: FLONASE Place 2  sprays into both nostrils daily.   furosemide 20 MG tablet Commonly known as: Lasix Lasix 20 mg daily + KCl 10 mEq daily x 3 days. Then Lasix 20 mg every other day + KCl 10 mEq every other day   LORazepam 1 MG tablet Commonly known as: ATIVAN TAKE 1/2 TO 1 TABLET BY MOUTH AT BEDTIME AS NEEDED What changed: reasons to take this   nitroGLYCERIN 0.4 MG SL tablet Commonly known as: NITROSTAT Place 1 tablet (0.4 mg total) under the tongue every 5 (five) minutes x 3 doses as needed for chest pain.   omeprazole 20 MG tablet Commonly known as: PRILOSEC OTC Take 1 tablet (20 mg total) by mouth daily.   potassium chloride 10 MEQ CR capsule Commonly known as: MICRO-K Lasix 20 mg daily + KCl 10 mEq daily x 3 days. Then Lasix 20 mg every other day + KCl 10 mEq every other day   traMADol 50 MG tablet Commonly known as: ULTRAM Take 1 tablet (50 mg total) by mouth every 12 (twelve) hours as needed. What changed: reasons to take  this   Vitamin D 50 MCG (2000 UT) tablet Take 2,000 Units by mouth daily.      Follow-up Information    [facility] Follow up.   Specialty: Home Health Services Why: someone from [facility] will call to schedule visits for physical therapy Contact information: [address] [place] [address] [phone]        [name], MD. Schedule an appointment as soon as possible for a visit in 1 week(s).   Specialty: Family Medicine Contact information: [place] [address] [phone]        Larey Dresser, MD. Schedule an appointment as soon as possible for a visit in 10 day(s).   Specialty: Cardiology Contact information: [id number] [place] [address] [phone]          Allergies  Allergen Reactions  . Tylenol [Acetaminophen] Rash  . Azithromycin   . Advil [Ibuprofen] Nausea Only and Rash  . Asa [Aspirin] Rash    Consultations:  Advanced heart failure    Procedures/Studies: X-ray Chest Pa And Lateral  Result Date: 12/29/2018 CLINICAL DATA:  Some shortness of breath. EXAM: CHEST - 2 VIEW COMPARISON:  12/26/2018. FINDINGS: Cardiac silhouette mildly enlarged. No mediastinal or hilar masses. No evidence of adenopathy. Small bilateral pleural effusions. Lungs otherwise clear. No pneumothorax. Surgical vascular clips are noted in the epigastric region. Skeletal structures are grossly intact. IMPRESSION: 1. No acute cardiopulmonary disease. 2. Mild cardiomegaly.  Small pleural effusions. Electronically Signed   By: Lajean Manes M.D.   On: 12/29/2018 14:21   Ct Head Wo Contrast  Result Date: 12/26/2018 CLINICAL DATA:  Head trauma syncopal episode EXAM: CT HEAD WITHOUT CONTRAST CT CERVICAL SPINE WITHOUT CONTRAST TECHNIQUE: Multidetector CT imaging of the head and cervical spine was performed following the standard protocol without intravenous contrast. Multiplanar CT image reconstructions  of the cervical spine were also generated. COMPARISON:  CT brain and cervical spine 03/20/2018 FINDINGS: CT HEAD FINDINGS Brain: No acute territorial infarction, hemorrhage, or intracranial mass is seen. Similar appearance of encephalomalacia/chronic infarcts within the right frontal and parietal lobes. Atrophy. Mild small vessel ischemic changes of the white matter. Stable ventricle size Vascular: No hyperdense vessel. Vertebral and carotid vascular calcification Skull: No fracture.  Right frontal osteoma Sinuses/Orbits: Interval extensive opacification of the ethmoid and maxillary sinuses with fluid levels  in the sphenoid sinuses. Other: None CT CERVICAL SPINE FINDINGS Alignment: Trace anterolisthesis C4 on C5 as before. Facet alignment normal Skull base and vertebrae: No acute fracture. No primary bone lesion or focal pathologic process. Soft tissues and spinal canal: No prevertebral fluid or swelling. No visible canal hematoma. Disc levels: Moderate degenerative changes C5-C6 and C6-C7. Multiple level facet degenerative change. Upper chest: Negative. Other: None IMPRESSION: 1. No CT evidence for acute intracranial abnormality. Atrophy and chronic infarct in the right frontal and parietal lobes. Mild small vessel ischemic changes of the white matter 2. No acute osseous abnormality of the cervical spine 3. Pansinusitis Electronically Signed   By: Donavan Foil M.D.   On: 12/26/2018 18:58   Ct Cervical Spine Wo Contrast  Result Date: 12/26/2018 CLINICAL DATA:  Head trauma syncopal episode EXAM: CT HEAD WITHOUT CONTRAST CT CERVICAL SPINE WITHOUT CONTRAST TECHNIQUE: Multidetector CT imaging of the head and cervical spine was performed following the standard protocol without intravenous contrast. Multiplanar CT image reconstructions of the cervical spine were also generated. COMPARISON:  CT brain and cervical spine 03/20/2018 FINDINGS: CT HEAD FINDINGS Brain: No acute territorial infarction, hemorrhage, or  intracranial mass is seen. Similar appearance of encephalomalacia/chronic infarcts within the right frontal and parietal lobes. Atrophy. Mild small vessel ischemic changes of the white matter. Stable ventricle size Vascular: No hyperdense vessel. Vertebral and carotid vascular calcification Skull: No fracture.  Right frontal osteoma Sinuses/Orbits: Interval extensive opacification of the ethmoid and maxillary sinuses with fluid levels in the sphenoid sinuses. Other: None CT CERVICAL SPINE FINDINGS Alignment: Trace anterolisthesis C4 on C5 as before. Facet alignment normal Skull base and vertebrae: No acute fracture. No primary bone lesion or focal pathologic process. Soft tissues and spinal canal: No prevertebral fluid or swelling. No visible canal hematoma. Disc levels: Moderate degenerative changes C5-C6 and C6-C7. Multiple level facet degenerative change. Upper chest: Negative. Other: None IMPRESSION: 1. No CT evidence for acute intracranial abnormality. Atrophy and chronic infarct in the right frontal and parietal lobes. Mild small vessel ischemic changes of the white matter 2. No acute osseous abnormality of the cervical spine 3. Pansinusitis Electronically Signed   By: Donavan Foil M.D.   On: 12/26/2018 18:58   Dg Pelvis Portable  Result Date: 12/26/2018 CLINICAL DATA:  Recent fall EXAM: PORTABLE PELVIS 1-2 VIEWS COMPARISON:  May 18, 2017 FINDINGS: Small osseous fragment seen inferior to the right femoral head which could represent a small chip fracture. there is sclerotic slightly expansile osseous lesion again noted within the left inferior pubic rami, and ilium. IMPRESSION: 1. Small osseous fragment seen adjacent to the inferior right femoral head which could represent a small chip fracture. If further evaluation is required, would recommend cross-sectional imaging. 2. Sclerotic osseous lesion within the left pelvis as on the prior CT May 18, 2017. Electronically Signed   By: Prudencio Pair M.D.    On: 12/26/2018 16:11   Dg Chest Port 1 View  Result Date: 12/26/2018 CLINICAL DATA:  Weakness and hypertension EXAM: PORTABLE CHEST 1 VIEW COMPARISON:  February 25, 2018 FINDINGS: There is mild cardiomegaly. Aortic knob calcifications. Both lungs are clear. The visualized skeletal structures are unremarkable. Surgical clips seen at the GE junction. IMPRESSION: No acute cardiopulmonary process. Electronically Signed   By: Prudencio Pair M.D.   On: 12/26/2018 16:08   Ct Renal Stone Study  Result Date: 12/26/2018 CLINICAL DATA:  Flank pain, recurrent stone disease suspected, suspected renal failure EXAM: CT ABDOMEN AND PELVIS WITHOUT CONTRAST TECHNIQUE:  Multidetector CT imaging of the abdomen and pelvis was performed following the standard protocol without IV contrast. COMPARISON:  CT 05/18/2017, MRI 02/09/2015 FINDINGS: Lower chest: Bandlike areas of opacity in the lung bases may reflect atelectasis and/or scarring. Trace right pleural effusion. Bilateral subpleural fat. Coronary artery calcifications are present as are calcifications of the mitral valve chordae tendineae and mitral annulus. Cardiac size at the upper limits of normal. Trace pericardial fluid within physiologic normal. Few pericardial calcifications may reflect sequela of prior pericarditis. Hepatobiliary: Stable 1 cm hypoattenuating cyst in the anterior left lobe liver (3/15). No concerning liver abnormality is seen. Patient is post cholecystectomy. Slight prominence of the biliary tree likely related to reservoir effect. No calcified intraductal gallstones. Pancreas: Unremarkable. No pancreatic ductal dilatation or surrounding inflammatory changes. Spleen: Normal in size without focal abnormality. Adrenals/Urinary Tract: Normal adrenal glands. Mild interval increase in the amount of bilateral perinephric stranding most notably in the right perirenal space. Redemonstration of the complex cystic lesion in the interpolar region left kidney with  extension into the renal sinus. This lesion again measures 8.9 x 4.8 x 6.4 cm in size, not significantly changed from comparison study when measured at a similar level. Complete characterization of this lesion is precluded in the absence of contrast media however the degree of septation is increased from comparison MRI with with calcified septations identified on today's study. No obstructing urolithiasis. Circumferential bladder wall thickening is noted with faint perivesicular haze. Indentation of the bladder base by the enlarged prostate Stomach/Bowel: Postsurgical changes at the diaphragmatic hiatus possibly reflecting a prior fundoplication. Stomach is otherwise unremarkable. No small bowel wall thickening or dilatation. No evidence of obstruction. A normal appendix is visualized. Portion of the colon protrudes into a left inguinal hernia without resulting obstruction at this time. Scattered colonic diverticula without focal pericolonic inflammation to suggest diverticulitis. Vascular/Lymphatic: Atherosclerotic plaque within the normal caliber aorta. No suspicious or enlarged lymph nodes in the included lymphatic chains. Reproductive: Moderate prostatomegaly. Mass effect on the bladder base. Other: Stable appearance of the left inguinal hernia containing portion of the sigmoid colon. Fat containing right inguinal hernia is present as well. No other bowel containing hernias. No free air or free fluid in the peritoneum. Right retroperitoneal free fluid tracking inferiorly possibly reactive and related to a right renal process. Musculoskeletal: Multilevel degenerative changes are present in the imaged portions of the spine. No acute osseous abnormality or suspicious osseous lesion. Stable sclerotic lesions in L2 and the right ilium as well as involving the left acetabulum and inferior pubic ramus and left pubic body. Multiple Schmorl's node formations. IMPRESSION: 1. Increasing perinephric stranding particularly  of the right kidney with reactive free fluid in the right posterior pararenal space. Given circumferential bladder wall thickening and probable outlet obstruction due to prostatomegaly, recommend correlation with urinalysis to exclude a cystitis and ascending urinary tract infection. Recently passed calculus could have a similar appearance. 2. Redemonstration of the complex cystic lesion in the interpolar region of the left kidney with extension into the renal sinus, which again measures up to 8.9 cm in size, not significantly changed in size from comparison study when measured at a similar level when measured at a similar level. Complete characterization of this lesion is precluded on this non-contrast CT exam. 3. Stable appearance of the left inguinal hernia containing portion of the sigmoid colon. No evidence of bowel obstruction at this time. 4. Multiple sclerotic foci involving L2, the right ilium and the left pubic root, acetabulum and  pubic body compatible with history of osseous metastatic disease. 5. Small right pleural effusion. 6. Aortic Atherosclerosis (ICD10-I70.0). Electronically Signed   By: Lovena Le M.D.   On: 12/26/2018 19:06   Dg Hip Unilat W Or Wo Pelvis 2-3 Views Right  Result Date: 12/26/2018 CLINICAL DATA:  Fall.  Pain. EXAM: DG HIP (WITH OR WITHOUT PELVIS) 2-3V RIGHT COMPARISON:  Today's CT, dictated separately. FINDINGS: No acute fracture or dislocation.  Sacroiliac joints are symmetric. Sclerosis involving the left inferior pubic ramus was present back on the CT of 05/18/2017, suspicious for metastatic disease. IMPRESSION: No acute osseous abnormality. Electronically Signed   By: Abigail Miyamoto M.D.   On: 12/26/2018 18:55      Discharge Exam: Vitals:   12/31/18 1211 12/31/18 1215  BP: (!) 152/126 (!) 103/57  Pulse: (!) 42 78  Resp:    Temp:    SpO2: 96% 99%    General: Pt is alert, awake, not in acute distress Cardiovascular: irregular rhythm, S1/S2 +, no  edema Respiratory: clear bilaterally, no wheezing, no rhonchi, no respiratory distress, no conversational dyspnea  Abdominal: Soft, NT, ND, bowel sounds + Extremities: no edema, no cyanosis Psych: Normal mood and affect, stable judgement and insight     The results of significant diagnostics from this hospitalization (including imaging, microbiology, ancillary and laboratory) are listed below for reference.     Microbiology: Recent Results (from the past 240 hour(s))  Novel Coronavirus, NAA ([facility])     Status: None   Collection Time: 12/25/18 12:00 AM   Specimen: Nasopharyngeal(NP) swabs in vial transport medium   NASOPHARYNGE  TESTING  Result Value Ref Range Status   SARS-CoV-2, NAA Not Detected Not Detected Final    Comment: This nucleic acid amplification test was developed and its performance characteristics determined by [employer]. Nucleic acid amplification tests include PCR and TMA. This test has not been FDA cleared or approved. This test has been authorized by FDA under an Emergency Use Authorization (EUA). This test is only authorized for the duration of time the declaration that circumstances exist justifying the authorization of the emergency use of in vitro diagnostic tests for detection of SARS-CoV-2 virus and/or diagnosis of COVID-19 infection under section 564(b)(1) of the Act, 21 U.S.C. [id number]) (1), unless the authorization is terminated or revoked sooner. When diagnostic testing is negative, the possibility of a false negative result should be considered in the context of a patient's recent exposures and the presence of clinical signs and symptoms consistent with COVID-19. An individual without symptoms of COVID-19 and who is not shedding SARS-CoV-2 virus would  expect to have a negative (not detected) result in this assay.   Blood Culture (routine x 2)     Status: Abnormal   Collection Time: 12/26/18  4:03 PM   Specimen: BLOOD  Result Value  Ref Range Status   Specimen Description BLOOD LEFT ANTECUBITAL  Final   Special Requests   Final    BOTTLES DRAWN AEROBIC AND ANAEROBIC Blood Culture results may not be optimal due to an inadequate volume of blood received in culture bottles   Culture  Setup Time   Final    IN BOTH AEROBIC AND ANAEROBIC BOTTLES GRAM NEGATIVE RODS CRITICAL VALUE NOTED.  VALUE IS CONSISTENT WITH PREVIOUSLY REPORTED AND CALLED VALUE.    Culture (A)  Final    ESCHERICHIA COLI SUSCEPTIBILITIES PERFORMED ON PREVIOUS CULTURE WITHIN THE LAST 5 DAYS. Performed at [facility], [place] [address], [place], [place] [address]  Report Status 12/29/2018 FINAL  Final  Blood Culture (routine x 2)     Status: Abnormal   Collection Time: 12/26/18  4:11 PM   Specimen: BLOOD RIGHT ARM  Result Value Ref Range Status   Specimen Description BLOOD RIGHT ARM  Final   Special Requests   Final    BOTTLES DRAWN AEROBIC AND ANAEROBIC Blood Culture adequate volume   Culture  Setup Time   Final    IN BOTH AEROBIC AND ANAEROBIC BOTTLES GRAM NEGATIVE RODS CRITICAL RESULT CALLED TO, READ BACK BY AND VERIFIED WITH: C AMEND [facility] 12/27/18 [id number] Performed at [facility], [place] [address], [place], [place] [address]    Culture ESCHERICHIA COLI (A)  Final   Report Status 12/29/2018 FINAL  Final   Organism ID, Bacteria ESCHERICHIA COLI  Final      Susceptibility   Escherichia coli - MIC*    AMPICILLIN 4 SENSITIVE Sensitive     CEFAZOLIN <=4 SENSITIVE Sensitive     CEFEPIME <=1 SENSITIVE Sensitive     CEFTAZIDIME <=1 SENSITIVE Sensitive     CEFTRIAXONE <=1 SENSITIVE Sensitive     CIPROFLOXACIN <=0.25 SENSITIVE Sensitive     GENTAMICIN <=1 SENSITIVE Sensitive     IMIPENEM <=0.25 SENSITIVE Sensitive     TRIMETH/SULFA <=20 SENSITIVE Sensitive     AMPICILLIN/SULBACTAM <=2 SENSITIVE Sensitive     PIP/TAZO <=4 SENSITIVE Sensitive     Extended ESBL NEGATIVE Sensitive     * ESCHERICHIA COLI  Blood Culture ID Panel (Reflexed)      Status: Abnormal   Collection Time: 12/26/18  4:11 PM  Result Value Ref Range Status   Enterococcus species NOT DETECTED NOT DETECTED Final   Listeria monocytogenes NOT DETECTED NOT DETECTED Final   Staphylococcus species NOT DETECTED NOT DETECTED Final   Staphylococcus aureus (BCID) NOT DETECTED NOT DETECTED Final   Streptococcus species NOT DETECTED NOT DETECTED Final   Streptococcus agalactiae NOT DETECTED NOT DETECTED Final   Streptococcus pneumoniae NOT DETECTED NOT DETECTED Final   Streptococcus pyogenes NOT DETECTED NOT DETECTED Final   Acinetobacter baumannii NOT DETECTED NOT DETECTED Final   Enterobacteriaceae species DETECTED (A) NOT DETECTED Final    Comment: Enterobacteriaceae represent a large family of gram-negative bacteria, not a single organism. CRITICAL RESULT CALLED TO, READ BACK BY AND VERIFIED WITH: C AMEND PHARMD 12/27/18 625 JDW    Enterobacter cloacae complex NOT DETECTED NOT DETECTED Final   Escherichia coli DETECTED (A) NOT DETECTED Final    Comment: CRITICAL RESULT CALLED TO, READ BACK BY AND VERIFIED WITH: C AMEND PHARMD 12/27/18 0625 JDW    Klebsiella oxytoca NOT DETECTED NOT DETECTED Final   Klebsiella pneumoniae NOT DETECTED NOT DETECTED Final   Proteus species NOT DETECTED NOT DETECTED Final   Serratia marcescens NOT DETECTED NOT DETECTED Final   Carbapenem resistance NOT DETECTED NOT DETECTED Final   Haemophilus influenzae NOT DETECTED NOT DETECTED Final   Neisseria meningitidis NOT DETECTED NOT DETECTED Final   Pseudomonas aeruginosa NOT DETECTED NOT DETECTED Final   Candida albicans NOT DETECTED NOT DETECTED Final   Candida glabrata NOT DETECTED NOT DETECTED Final   Candida krusei NOT DETECTED NOT DETECTED Final   Candida parapsilosis NOT DETECTED NOT DETECTED Final   Candida tropicalis NOT DETECTED NOT DETECTED Final    Comment: Performed at [facility], [place] [address] [place], [place] [address]  Urine culture     Status: Abnormal    Collection Time: 12/26/18  4:17 PM  Specimen: In/Out Cath Urine  Result Value Ref Range Status   Specimen Description IN/OUT CATH URINE  Final   Special Requests   Final    NONE Performed at [facility], [address], [place], [place] [address]    Culture >=100,000 COLONIES/mL ESCHERICHIA COLI (A)  Final   Report Status 12/28/2018 FINAL  Final   Organism ID, Bacteria ESCHERICHIA COLI (A)  Final      Susceptibility   Escherichia coli - MIC*    AMPICILLIN 4 SENSITIVE Sensitive     CEFAZOLIN <=4 SENSITIVE Sensitive     CEFTRIAXONE <=1 SENSITIVE Sensitive     CIPROFLOXACIN <=0.25 SENSITIVE Sensitive     GENTAMICIN <=1 SENSITIVE Sensitive     IMIPENEM <=0.25 SENSITIVE Sensitive     NITROFURANTOIN <=16 SENSITIVE Sensitive     TRIMETH/SULFA <=20 SENSITIVE Sensitive     AMPICILLIN/SULBACTAM <=2 SENSITIVE Sensitive     PIP/TAZO <=4 SENSITIVE Sensitive     Extended ESBL NEGATIVE Sensitive     * >=100,000 COLONIES/mL ESCHERICHIA COLI  SARS CORONAVIRUS 2 (TAT 6-24 HRS) Nasopharyngeal Nasopharyngeal Swab     Status: None   Collection Time: 12/26/18  4:35 PM   Specimen: Nasopharyngeal Swab  Result Value Ref Range Status   SARS Coronavirus 2 NEGATIVE NEGATIVE Final    Comment: (NOTE) SARS-CoV-2 target nucleic acids are NOT DETECTED. The SARS-CoV-2 RNA is generally detectable in upper and lower respiratory specimens during the acute phase of infection. Negative results do not preclude SARS-CoV-2 infection, do not rule out co-infections with other pathogens, and should not be used as the sole basis for treatment or other patient management decisions. Negative results must be combined with clinical observations, patient history, and epidemiological information. The expected result is Negative. Fact Sheet for Patients: SugarRoll.be Fact Sheet for Healthcare Providers: https://www.woods-mathews.com/ This test is not yet approved or cleared by  the [place] FDA and  has been authorized for detection and/or diagnosis of SARS-CoV-2 by FDA under an Emergency Use Authorization (EUA). This EUA will remain  in effect (meaning this test can be used) for the duration of the COVID-19 declaration under Section 56 4(b)(1) of the Act, 21 U.S.C. section 360bbb-3(b)(1), unless the authorization is terminated or revoked sooner. Performed at [facility], [place] [address], [place], [place] [address]   Culture, blood (routine x 2)     Status: None (Preliminary result)   Collection Time: 12/29/18 12:27 PM   Specimen: BLOOD  Result Value Ref Range Status   Specimen Description BLOOD RIGHT ANTECUBITAL  Final   Special Requests   Final    BOTTLES DRAWN AEROBIC ONLY Blood Culture adequate volume   Culture   Final    NO GROWTH 2 DAYS Performed at [facility], [place] [address], [place], [place] [address]    Report Status PENDING  Incomplete  Culture, blood (routine x 2)     Status: None (Preliminary result)   Collection Time: 12/29/18 12:33 PM   Specimen: BLOOD RIGHT HAND  Result Value Ref Range Status   Specimen Description BLOOD RIGHT HAND  Final   Special Requests   Final    BOTTLES DRAWN AEROBIC ONLY Blood Culture adequate volume   Culture   Final    NO GROWTH 2 DAYS Performed at [facility], [place] [address], [place], [place] [address]    Report Status PENDING  Incomplete     Labs: BNP (last 3 results) Recent Labs    12/26/18  1552 12/29/18 0649  BNP 334.3* 123456*   Basic Metabolic Panel: Recent Labs  Lab 12/26/18 1540  12/27/18 1535 12/28/18 0533 12/29/18 0649 12/30/18 0451 12/31/18 0512  NA 127*   < > 134* 134* 135 133* 136  K 4.7   < > 4.0 3.8 3.9 4.2 3.4*  CL 96*   < > 103 107 108 106 107  CO2 19*   < > 17* 18* 17* 17* 22  GLUCOSE 131*   < > 115* 137* 111* 116* 121*  BUN 67*   < > 61* 52* 42* 34* 33*  CREATININE 2.70*   < > 1.91* 1.71* 1.48* 1.28* 1.24  CALCIUM 7.8*   < > 8.2* 7.9* 8.1* 8.1*  8.1*  MG 1.7  --   --  2.0 1.8 1.6* 1.6*   < > = values in this interval not displayed.   Liver Function Tests: Recent Labs  Lab 12/26/18 1540 12/27/18 0332 12/28/18 0533  AST 43* 37 47*  ALT 23 24 36  ALKPHOS 68 70 66  BILITOT 1.4* 1.4* 0.6  PROT 5.1* 5.3* 4.9*  ALBUMIN 2.6* 2.5* 2.1*   No results for input(s): LIPASE, AMYLASE in the last 168 hours. No results for input(s): AMMONIA in the last 168 hours. CBC: Recent Labs  Lab 12/26/18 1540 12/27/18 0332 12/28/18 0533 12/29/18 0649 12/30/18 0451 12/31/18 0512  WBC 17.0* 15.2* 9.4 8.9 10.5 12.6*  NEUTROABS 14.2* 12.6* 8.4* 7.3  --   --   HGB 11.3* 11.3* 10.5* 10.9* 10.4* 10.3*  HCT 34.0* 34.7* 32.0* 32.9* 32.0* 30.6*  MCV 91.6 92.3 90.9 90.4 92.0 89.7  PLT 72* 59* 60* 70* 76* 118*   Cardiac Enzymes: No results for input(s): CKTOTAL, CKMB, CKMBINDEX, TROPONINI in the last 168 hours. BNP: Invalid input(s): POCBNP CBG: No results for input(s): GLUCAP in the last 168 hours. D-Dimer No results for input(s): DDIMER in the last 72 hours. Hgb A1c No results for input(s): HGBA1C in the last 72 hours. Lipid Profile No results for input(s): CHOL, HDL, LDLCALC, TRIG, CHOLHDL, LDLDIRECT in the last 72 hours. Thyroid function studies No results for input(s): TSH, T4TOTAL, T3FREE, THYROIDAB in the last 72 hours.  Invalid input(s): FREET3 Anemia work up No results for input(s): VITAMINB12, FOLATE, FERRITIN, TIBC, IRON, RETICCTPCT in the last 72 hours. Urinalysis    Component Value Date/Time   COLORURINE AMBER (A) 12/26/2018 1640   APPEARANCEUR CLOUDY (A) 12/26/2018 1640   LABSPEC 1.016 12/26/2018 1640   PHURINE 5.0 12/26/2018 1640   GLUCOSEU NEGATIVE 12/26/2018 1640   HGBUR MODERATE (A) 12/26/2018 1640   HGBUR trace-lysed 07/18/2009 1022   BILIRUBINUR NEGATIVE 12/26/2018 1640   BILIRUBINUR N 01/04/2017 1524   KETONESUR NEGATIVE 12/26/2018 1640   PROTEINUR 100 (A) 12/26/2018 1640   UROBILINOGEN 0.2 01/04/2017 1524    UROBILINOGEN 0.2 07/18/2009 1022   NITRITE NEGATIVE 12/26/2018 1640   LEUKOCYTESUR LARGE (A) 12/26/2018 1640   Sepsis Labs Invalid input(s): PROCALCITONIN,  WBC,  LACTICIDVEN Microbiology Recent Results (from the past 240 hour(s))  Novel Coronavirus, NAA ([facility])     Status: None   Collection Time: 12/25/18 12:00 AM   Specimen: Nasopharyngeal(NP) swabs in vial transport medium   NASOPHARYNGE  TESTING  Result Value Ref Range Status   SARS-CoV-2, NAA Not Detected Not Detected Final    Comment: This nucleic acid amplification test was developed and its performance characteristics determined by [employer]. Nucleic acid amplification tests include PCR and TMA. This test has not been FDA cleared  or approved. This test has been authorized by FDA under an Emergency Use Authorization (EUA). This test is only authorized for the duration of time the declaration that circumstances exist justifying the authorization of the emergency use of in vitro diagnostic tests for detection of SARS-CoV-2 virus and/or diagnosis of COVID-19 infection under section 564(b)(1) of the Act, 21 U.S.C. [id number]) (1), unless the authorization is terminated or revoked sooner. When diagnostic testing is negative, the possibility of a false negative result should be considered in the context of a patient's recent exposures and the presence of clinical signs and symptoms consistent with COVID-19. An individual without symptoms of COVID-19 and who is not shedding SARS-CoV-2 virus would  expect to have a negative (not detected) result in this assay.   Blood Culture (routine x 2)     Status: Abnormal   Collection Time: 12/26/18  4:03 PM   Specimen: BLOOD  Result Value Ref Range Status   Specimen Description BLOOD LEFT ANTECUBITAL  Final   Special Requests   Final    BOTTLES DRAWN AEROBIC AND ANAEROBIC Blood Culture results may not be optimal due to an inadequate volume of blood received in culture bottles    Culture  Setup Time   Final    IN BOTH AEROBIC AND ANAEROBIC BOTTLES GRAM NEGATIVE RODS CRITICAL VALUE NOTED.  VALUE IS CONSISTENT WITH PREVIOUSLY REPORTED AND CALLED VALUE.    Culture (A)  Final    ESCHERICHIA COLI SUSCEPTIBILITIES PERFORMED ON PREVIOUS CULTURE WITHIN THE LAST 5 DAYS. Performed at [facility], [place] [address], [place], [place] [address]    Report Status 12/29/2018 FINAL  Final  Blood Culture (routine x 2)     Status: Abnormal   Collection Time: 12/26/18  4:11 PM   Specimen: BLOOD RIGHT ARM  Result Value Ref Range Status   Specimen Description BLOOD RIGHT ARM  Final   Special Requests   Final    BOTTLES DRAWN AEROBIC AND ANAEROBIC Blood Culture adequate volume   Culture  Setup Time   Final    IN BOTH AEROBIC AND ANAEROBIC BOTTLES GRAM NEGATIVE RODS CRITICAL RESULT CALLED TO, READ BACK BY AND VERIFIED WITH: C AMEND [facility] 12/27/18 [id number] Performed at [facility], [place] [address], [place], [place] [address]    Culture ESCHERICHIA COLI (A)  Final   Report Status 12/29/2018 FINAL  Final   Organism ID, Bacteria ESCHERICHIA COLI  Final      Susceptibility   Escherichia coli - MIC*    AMPICILLIN 4 SENSITIVE Sensitive     CEFAZOLIN <=4 SENSITIVE Sensitive     CEFEPIME <=1 SENSITIVE Sensitive     CEFTAZIDIME <=1 SENSITIVE Sensitive     CEFTRIAXONE <=1 SENSITIVE Sensitive     CIPROFLOXACIN <=0.25 SENSITIVE Sensitive     GENTAMICIN <=1 SENSITIVE Sensitive     IMIPENEM <=0.25 SENSITIVE Sensitive     TRIMETH/SULFA <=20 SENSITIVE Sensitive     AMPICILLIN/SULBACTAM <=2 SENSITIVE Sensitive     PIP/TAZO <=4 SENSITIVE Sensitive     Extended ESBL NEGATIVE Sensitive     * ESCHERICHIA COLI  Blood Culture ID Panel (Reflexed)     Status: Abnormal   Collection Time: 12/26/18  4:11 PM  Result Value Ref Range Status   Enterococcus species NOT DETECTED NOT DETECTED Final   Listeria monocytogenes NOT DETECTED NOT DETECTED Final   Staphylococcus species NOT DETECTED  NOT DETECTED Final   Staphylococcus aureus (BCID) NOT DETECTED NOT DETECTED Final   Streptococcus species  NOT DETECTED NOT DETECTED Final   Streptococcus agalactiae NOT DETECTED NOT DETECTED Final   Streptococcus pneumoniae NOT DETECTED NOT DETECTED Final   Streptococcus pyogenes NOT DETECTED NOT DETECTED Final   Acinetobacter baumannii NOT DETECTED NOT DETECTED Final   Enterobacteriaceae species DETECTED (A) NOT DETECTED Final    Comment: Enterobacteriaceae represent a large family of gram-negative bacteria, not a single organism. CRITICAL RESULT CALLED TO, READ BACK BY AND VERIFIED WITH: C AMEND PHARMD 12/27/18 625 JDW    Enterobacter cloacae complex NOT DETECTED NOT DETECTED Final   Escherichia coli DETECTED (A) NOT DETECTED Final    Comment: CRITICAL RESULT CALLED TO, READ BACK BY AND VERIFIED WITH: C AMEND PHARMD 12/27/18 0625 JDW    Klebsiella oxytoca NOT DETECTED NOT DETECTED Final   Klebsiella pneumoniae NOT DETECTED NOT DETECTED Final   Proteus species NOT DETECTED NOT DETECTED Final   Serratia marcescens NOT DETECTED NOT DETECTED Final   Carbapenem resistance NOT DETECTED NOT DETECTED Final   Haemophilus influenzae NOT DETECTED NOT DETECTED Final   Neisseria meningitidis NOT DETECTED NOT DETECTED Final   Pseudomonas aeruginosa NOT DETECTED NOT DETECTED Final   Candida albicans NOT DETECTED NOT DETECTED Final   Candida glabrata NOT DETECTED NOT DETECTED Final   Candida krusei NOT DETECTED NOT DETECTED Final   Candida parapsilosis NOT DETECTED NOT DETECTED Final   Candida tropicalis NOT DETECTED NOT DETECTED Final    Comment: Performed at [facility], [place] [address], [place], [place] [address]  Urine culture     Status: Abnormal   Collection Time: 12/26/18  4:17 PM   Specimen: In/Out Cath Urine  Result Value Ref Range Status   Specimen Description IN/OUT CATH URINE  Final   Special Requests   Final    NONE Performed at [facility], [place] [address]  [place], [place] [address]    Culture >=100,000 COLONIES/mL ESCHERICHIA COLI (A)  Final   Report Status 12/28/2018 FINAL  Final   Organism ID, Bacteria ESCHERICHIA COLI (A)  Final      Susceptibility   Escherichia coli - MIC*    AMPICILLIN 4 SENSITIVE Sensitive     CEFAZOLIN <=4 SENSITIVE Sensitive     CEFTRIAXONE <=1 SENSITIVE Sensitive     CIPROFLOXACIN <=0.25 SENSITIVE Sensitive     GENTAMICIN <=1 SENSITIVE Sensitive     IMIPENEM <=0.25 SENSITIVE Sensitive     NITROFURANTOIN <=16 SENSITIVE Sensitive     TRIMETH/SULFA <=20 SENSITIVE Sensitive     AMPICILLIN/SULBACTAM <=2 SENSITIVE Sensitive     PIP/TAZO <=4 SENSITIVE Sensitive     Extended ESBL NEGATIVE Sensitive     * >=100,000 COLONIES/mL ESCHERICHIA COLI  SARS CORONAVIRUS 2 (TAT 6-24 HRS) Nasopharyngeal Nasopharyngeal Swab     Status: None   Collection Time: 12/26/18  4:35 PM   Specimen: Nasopharyngeal Swab  Result Value Ref Range Status   SARS Coronavirus 2 NEGATIVE NEGATIVE Final    Comment: (NOTE) SARS-CoV-2 target nucleic acids are NOT DETECTED. The SARS-CoV-2 RNA is generally detectable in upper and lower respiratory specimens during the acute phase of infection. Negative results do not preclude SARS-CoV-2 infection, do not rule out co-infections with other pathogens, and should not be used as the sole basis for treatment or other patient management decisions. Negative results must be combined with clinical observations, patient history, and epidemiological information. The expected result is Negative. Fact Sheet for Patients: SugarRoll.be Fact Sheet for Healthcare Providers: https://www.woods-mathews.com/ This test is not yet approved or cleared by the  [place] Architectural technologist and  has been authorized for detection and/or diagnosis of SARS-CoV-2 by FDA under an Print production planner (EUA). This EUA will remain  in effect (meaning this test can be used) for the duration of  the COVID-19 declaration under Section 56 4(b)(1) of the Act, 21 U.S.C. section 360bbb-3(b)(1), unless the authorization is terminated or revoked sooner. Performed at [facility], [place] [address] [place], [place] [address]   Culture, blood (routine x 2)     Status: None (Preliminary result)   Collection Time: 12/29/18 12:27 PM   Specimen: BLOOD  Result Value Ref Range Status   Specimen Description BLOOD RIGHT ANTECUBITAL  Final   Special Requests   Final    BOTTLES DRAWN AEROBIC ONLY Blood Culture adequate volume   Culture   Final    NO GROWTH 2 DAYS Performed at [facility], [place] [address], [place], [place] [address]    Report Status PENDING  Incomplete  Culture, blood (routine x 2)     Status: None (Preliminary result)   Collection Time: 12/29/18 12:33 PM   Specimen: BLOOD RIGHT HAND  Result Value Ref Range Status   Specimen Description BLOOD RIGHT HAND  Final   Special Requests   Final    BOTTLES DRAWN AEROBIC ONLY Blood Culture adequate volume   Culture   Final    NO GROWTH 2 DAYS Performed at [facility], [place] [address] [place], [place] [address]    Report Status PENDING  Incomplete     Patient was seen and examined on the day of discharge and was found to be in stable condition. Time coordinating discharge: 35 minutes including assessment and coordination of care, as well as examination of the patient.   SIGNED:  Dessa Phi, DO [facility] 12/31/2018, 12:38 PM

## 2018-12-31 NOTE — TOC Transition Note (Signed)
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 CM/SW Contact:  Carles Collet, RN Phone Number: 12/31/2018, 1:59 PM   Clinical Narrative:    LVM for [facility] that patient will DC to home today. No other CM needs    Final next level of care: Home w Home Health Services Barriers to Discharge: No Barriers Identified   Patient Goals and CMS Choice Patient states their goals for this hospitalization and ongoing recovery are:: return home with spouse CMS Medicare.gov Compare Post Acute Care list provided to:: Patient Choice offered to / list presented to : Patient  Discharge Placement                       Discharge Plan and Services In-house Referral: NA Discharge Planning Services: CM Consult Post Acute Care Choice: Home Health          DME Arranged: N/A DME Agency: NA       HH Arranged: PT HH Agency: [facility] Date [place]: 12/31/18 Time [place]: [id number] Representative spoke with at [place]: [place] ([address]) Interventions     Readmission Risk Interventions No flowsheet data found.

## 2018-12-31 NOTE — Progress Notes (Signed)
DISCHARGE NOTE HOME [name], MD to be discharged to home per MD order. Discussed how to take care condom catheter,given emphasis that at most ,the condom catheter needs to be removed after three days,to watch the redness of penile skin and if that happen needs to discontinue condom catheter  and keep the tube and bag below the bed to prevent backflow and pooling of urine at the penile tip.Discussed prescriptions and follow up appointments with the patient. Prescriptions given to patient; medication list explained in detail. Patient verbalized understanding.  Skin clean, dry and intact without evidence of skin break down, no evidence of skin tears noted. IV catheter discontinued intact. Site without signs and symptoms of complications. Dressing and pressure applied. Pt denies pain at the site currently. No complaints noted.  Patient free of lines, drains, and wounds.   An After Visit Summary (AVS) was printed and given to the patient. Patient escorted via wheelchair, and discharged home via private auto.  [place], Zenon Mayo, RN

## 2018-12-31 NOTE — Plan of Care (Signed)
See discharged note. 

## 2019-01-02 ENCOUNTER — Telehealth: Payer: Self-pay | Admitting: *Deleted

## 2019-01-02 NOTE — Telephone Encounter (Signed)
Transition Care Management Follow-up Telephone Call   Date discharged? 11.22.2020   How have you been since you were released from the hospital? "I'm trying to get better"    Do you understand why you were in the hospital? yes   Do you understand the discharge instructions? yes   Where were you discharged to? Home    Items Reviewed:  Medications reviewed: yes  Allergies reviewed: yes  Dietary changes reviewed: Heart Healthy   Referrals reviewed: N/A    Functional Questionnaire:   Activities of Daily Living (ADLs):   He states they are independent in the following: ambulation, bathing and hygiene, feeding, continence, grooming, toileting and dressing States they require assistance with the following: N/A    Any transportation issues/concerns?: no   Any patient concerns? no   Confirmed importance and date/time of follow-up visits scheduled yes  Provider Appointment booked with Dr. Elease Hashimoto 01/08/2019   Confirmed with patient if condition begins to worsen call PCP or go to the ER.  Patient was given the office number and encouraged to call back with question or concerns.  : yes

## 2019-01-03 DIAGNOSIS — I2510 Atherosclerotic heart disease of native coronary artery without angina pectoris: Secondary | ICD-10-CM | POA: Diagnosis not present

## 2019-01-03 DIAGNOSIS — Z9181 History of falling: Secondary | ICD-10-CM | POA: Diagnosis not present

## 2019-01-03 DIAGNOSIS — Z7952 Long term (current) use of systemic steroids: Secondary | ICD-10-CM | POA: Diagnosis not present

## 2019-01-03 DIAGNOSIS — J4530 Mild persistent asthma, uncomplicated: Secondary | ICD-10-CM | POA: Diagnosis not present

## 2019-01-03 DIAGNOSIS — N182 Chronic kidney disease, stage 2 (mild): Secondary | ICD-10-CM | POA: Diagnosis not present

## 2019-01-03 DIAGNOSIS — F419 Anxiety disorder, unspecified: Secondary | ICD-10-CM | POA: Diagnosis not present

## 2019-01-03 DIAGNOSIS — E7800 Pure hypercholesterolemia, unspecified: Secondary | ICD-10-CM | POA: Diagnosis not present

## 2019-01-03 DIAGNOSIS — I130 Hypertensive heart and chronic kidney disease with heart failure and stage 1 through stage 4 chronic kidney disease, or unspecified chronic kidney disease: Secondary | ICD-10-CM | POA: Diagnosis not present

## 2019-01-03 DIAGNOSIS — Z87440 Personal history of urinary (tract) infections: Secondary | ICD-10-CM | POA: Diagnosis not present

## 2019-01-03 DIAGNOSIS — Z7901 Long term (current) use of anticoagulants: Secondary | ICD-10-CM | POA: Diagnosis not present

## 2019-01-03 DIAGNOSIS — Z96653 Presence of artificial knee joint, bilateral: Secondary | ICD-10-CM | POA: Diagnosis not present

## 2019-01-03 DIAGNOSIS — I480 Paroxysmal atrial fibrillation: Secondary | ICD-10-CM | POA: Diagnosis not present

## 2019-01-03 DIAGNOSIS — Z20828 Contact with and (suspected) exposure to other viral communicable diseases: Secondary | ICD-10-CM | POA: Diagnosis not present

## 2019-01-03 DIAGNOSIS — N400 Enlarged prostate without lower urinary tract symptoms: Secondary | ICD-10-CM | POA: Diagnosis not present

## 2019-01-03 DIAGNOSIS — I272 Pulmonary hypertension, unspecified: Secondary | ICD-10-CM | POA: Diagnosis not present

## 2019-01-03 DIAGNOSIS — K219 Gastro-esophageal reflux disease without esophagitis: Secondary | ICD-10-CM | POA: Diagnosis not present

## 2019-01-03 DIAGNOSIS — I5032 Chronic diastolic (congestive) heart failure: Secondary | ICD-10-CM | POA: Diagnosis not present

## 2019-01-03 DIAGNOSIS — F329 Major depressive disorder, single episode, unspecified: Secondary | ICD-10-CM | POA: Diagnosis not present

## 2019-01-03 DIAGNOSIS — M1811 Unilateral primary osteoarthritis of first carpometacarpal joint, right hand: Secondary | ICD-10-CM | POA: Diagnosis not present

## 2019-01-03 DIAGNOSIS — Z8546 Personal history of malignant neoplasm of prostate: Secondary | ICD-10-CM | POA: Diagnosis not present

## 2019-01-03 LAB — CULTURE, BLOOD (ROUTINE X 2)
Culture: NO GROWTH
Culture: NO GROWTH
Special Requests: ADEQUATE
Special Requests: ADEQUATE

## 2019-01-08 ENCOUNTER — Ambulatory Visit (INDEPENDENT_AMBULATORY_CARE_PROVIDER_SITE_OTHER): Payer: PPO | Admitting: Family Medicine

## 2019-01-08 ENCOUNTER — Other Ambulatory Visit (HOSPITAL_COMMUNITY): Payer: Self-pay

## 2019-01-08 ENCOUNTER — Other Ambulatory Visit: Payer: Self-pay

## 2019-01-08 DIAGNOSIS — N189 Chronic kidney disease, unspecified: Secondary | ICD-10-CM

## 2019-01-08 DIAGNOSIS — N179 Acute kidney failure, unspecified: Secondary | ICD-10-CM

## 2019-01-08 DIAGNOSIS — N390 Urinary tract infection, site not specified: Secondary | ICD-10-CM | POA: Diagnosis not present

## 2019-01-08 DIAGNOSIS — I480 Paroxysmal atrial fibrillation: Secondary | ICD-10-CM

## 2019-01-08 DIAGNOSIS — I5032 Chronic diastolic (congestive) heart failure: Secondary | ICD-10-CM

## 2019-01-08 MED ORDER — TAMSULOSIN HCL 0.4 MG PO CAPS: 0.4000 mg | ORAL_CAPSULE | Freq: Every day | ORAL | 3 refills | Status: DC

## 2019-01-08 MED ORDER — BUPROPION HCL ER (SR) 150 MG PO TB12: 150.0000 mg | ORAL_TABLET | Freq: Every day | ORAL | 3 refills | Status: DC

## 2019-01-08 MED ORDER — DOFETILIDE 250 MCG PO CAPS: 250.0000 ug | ORAL_CAPSULE | Freq: Two times a day (BID) | ORAL | 0 refills | Status: DC

## 2019-01-08 NOTE — Progress Notes (Signed)
Patient ID: [name] Spence, MD, male   DOB: 24-Aug-1929, 85 y.o.   MRN: [id number]  This visit type was conducted due to national recommendations for restrictions regarding the COVID-19 pandemic in an effort to limit this patient's exposure and mitigate transmission in our community.   Virtual Visit via Video Note  I connected with Dr. Coralie Common on 01/08/19 at  2:30 PM EST by a video enabled telemedicine application and verified that I am speaking with the correct person using two identifiers.  Location patient: home Location provider:work or home office Persons participating in the virtual visit: patient, provider  I discussed the limitations of evaluation and management by telemedicine and the availability of in person appointments. The patient expressed understanding and agreed to proceed.   HPI: Follow-up for recent hospitalization with probable urosepsis.  His chronic problems include history of asthma, CAD, A. fib, hypertension, diastolic heart failure, chronic kidney disease, hyperlipidemia, prostate cancer.  He had generalized weakness.  His initial concern was possible Covid as one of his golfing buddies had been diagnosed a week previous.  On the day of admission he had grown weak to the point of sliding down out of his chair onto the floor.  He was unable to get up.  They had planned to do virtual visit with [place] later that day but his wife called EMS at that point.  His blood pressure was apparently in the 71G systolic.  Initial Covid testing negative.  He was severely hypotensive and meeting sepsis criteria.  CT abdomen pelvis shows some perinephric stranding both kidneys.  Lactic acid 3.1.  Patient was started on cefepime and vancomycin and then transitioned to Rocephin and then eventually Keflex prior to discharge.  He grew out E. coli.  Denies any prior history of UTI.  Patient does have some slow urinary stream.  He is on tamsulosin 0.4 mg.  At one point he been taken off this by  urology but he start himself back on that prior to his admission.  He is requesting refills.  He had acute kidney injury superimposed on chronic kidney disease.  His creatinine is up around 2 discharge creatinine 1.24.  Admittedly not drinking a lot of fluids.  Denies any orthostatic symptoms at this time.  History of acute on chronic diastolic heart failure.  He has follow-up with cardiology this Wednesday as well as urology.   ROS: See pertinent positives and negatives per HPI.  Past Medical History:  Diagnosis Date  . Allergic rhinitis   . Anxiety   . Asthma   . Atrial fibrillation ([place])   . CAD (coronary artery disease)   . Depression   . Diverticulosis 03/16/1995  . DJD (degenerative joint disease)    "most joints" (03/11/2015)  . GERD (gastroesophageal reflux disease) 12/30/2000  . Heart attack ([place]) 1984  . Hiatal hernia 12/30/2000  . History of blood transfusion 1988   "related to GI bleeding OR"  . History of duodenal ulcer 10/15/1986  . Hyperlipidemia   . Hypertension   . Sepsis ([address]) 12/2018  . Sinus bradycardia   . Stroke [facility])     Past Surgical History:  Procedure Laterality Date  . CARDIAC CATHETERIZATION    . CATARACT EXTRACTION W/ INTRAOCULAR LENS  IMPLANT, BILATERAL Bilateral ?2013  . CHOLECYSTECTOMY OPEN  1989  . CORONARY ANGIOPLASTY WITH STENT PLACEMENT  2010  . hospital ccu  1984   heart attack, hypoplastic right coronary  . INGUINAL HERNIA REPAIR Right ~ 1990  . NASAL  [place]  . ORCHIECTOMY Bilateral 06/10/2017   Procedure: BILATERAL ORCHIECTOMY;  Surgeon: Franchot Gallo, MD;  Location: [facility] ORS;  Service: Urology;  Laterality: Bilateral;  MAC ANESTHESIA AND LOCAL  . TONSILLECTOMY    . TOTAL KNEE ARTHROPLASTY Bilateral 1998-2004   "right-left"  . VAGOTOMY AND PYLOROPLASTY  1988   "bleeding duodenal ulcer"    Family History  Problem Relation Age of Onset  . Coronary artery disease Mother        deceased  . Deep vein thrombosis Sister    . Coronary artery disease Brother   . Asthma Brother   . Colon cancer Neg Hx     SOCIAL HX: non-smoker.  Retired Stage manager.  Lives with wife.     Current Outpatient Medications:  .  apixaban (ELIQUIS) 5 MG TABS tablet, Take 1 tablet (5 mg total) by mouth 2 (two) times daily. Needs apt for further refills, Disp: 180 tablet, Rfl: 0 .  atenolol (TENORMIN) 25 MG tablet, TAKE 1 TABLET BY MOUTH DAILY. (Patient taking differently: Take 25 mg by mouth daily. ), Disp: 90 tablet, Rfl: 3 .  atorvastatin (LIPITOR) 20 MG tablet, TAKE 1 TABLET(20 MG) BY MOUTH DAILY (Patient taking differently: Take 20 mg by mouth daily. ), Disp: 90 tablet, Rfl: 0 .  buPROPion (WELLBUTRIN SR) 150 MG 12 hr tablet, Take 1 tablet (150 mg total) by mouth daily., Disp: 90 tablet, Rfl: 3 .  cetirizine (ZYRTEC) 10 MG tablet, Take 10 mg by mouth daily with supper. , Disp: , Rfl:  .  Cholecalciferol (VITAMIN D) 2000 units tablet, Take 2,000 Units by mouth daily., Disp: , Rfl:  .  diltiazem (CARDIZEM) 30 MG tablet, Take 30 mg by mouth as needed., Disp: , Rfl:  .  dofetilide (TIKOSYN) 250 MCG capsule, Take 1 capsule (250 mcg total) by mouth 2 (two) times daily. Needs appt for further refills, Disp: 180 capsule, Rfl: 0 .  fluticasone (FLONASE) 50 MCG/ACT nasal spray, Place 2 sprays into both nostrils daily., Disp: 48 g, Rfl: 3 .  Fluticasone Furoate (ARNUITY ELLIPTA) 200 MCG/ACT AEPB, Inhale 1 puff into the lungs daily., Disp: 2 each, Rfl: 0 .  furosemide (LASIX) 20 MG tablet, Lasix 20 mg daily + KCl 10 mEq daily x 3 days. Then Lasix 20 mg every other day + KCl 10 mEq every other day, Disp: 30 tablet, Rfl: 0 .  LORazepam (ATIVAN) 1 MG tablet, TAKE 1/2 TO 1 TABLET BY MOUTH AT BEDTIME AS NEEDED (Patient taking differently: Take 0.5-1 mg by mouth at bedtime as needed for anxiety. ), Disp: 90 tablet, Rfl: 1 .  nitroGLYCERIN (NITROSTAT) 0.4 MG SL tablet, Place 1 tablet (0.4 mg total) under the tongue every 5 (five) minutes x 3 doses as  needed for chest pain., Disp: 25 tablet, Rfl: 2 .  omeprazole (PRILOSEC OTC) 20 MG tablet, Take 1 tablet (20 mg total) by mouth daily., Disp: 100 tablet, Rfl: 4 .  potassium chloride (MICRO-K) 10 MEQ CR capsule, Lasix 20 mg daily + KCl 10 mEq daily x 3 days. Then Lasix 20 mg every other day + KCl 10 mEq every other day, Disp: 30 capsule, Rfl: 0 .  tamsulosin (FLOMAX) 0.4 MG CAPS capsule, Take 1 capsule (0.4 mg total) by mouth daily., Disp: 90 capsule, Rfl: 3 .  traMADol (ULTRAM) 50 MG tablet, Take 1 tablet (50 mg total) by mouth every 12 (twelve) hours as needed. (Patient taking differently: Take 50 mg by mouth every 12 (twelve) hours as  needed for moderate pain. ), Disp: 120 tablet, Rfl: 5  EXAM:  VITALS per patient if applicable:  GENERAL: alert, oriented, appears well and in no acute distress  HEENT: atraumatic, conjunttiva clear, no obvious abnormalities on inspection of external nose and ears  NECK: normal movements of the head and neck  LUNGS: on inspection no signs of respiratory distress, breathing rate appears normal, no obvious gross SOB, gasping or wheezing  CV: no obvious cyanosis  MS: moves all visible extremities without noticeable abnormality  PSYCH/NEURO: pleasant and cooperative, no obvious depression or anxiety, speech and thought processing grossly intact  ASSESSMENT AND PLAN:  Discussed the following assessment and plan:  #1 recent urosepsis with E. coli.  Blood culture came back negative.  Patient has completed full course of antibiotics at this time and has no residual symptoms.  No fever and no orthostatic changes -Follow-up promptly for any recurrent fevers or chills -Continue tamsulosin 0.4 mg nightly with refills given  #2 acute on chronic kidney injury.  Kidney function improved following hydration -He has an office follow-up with cardiology Wednesday.  Consider basic metabolic panel then  #3 history of acute on chronic diastolic heart failure.  Patient  taking low-dose Lasix 20 mg every other day along with potassium supplement  #4 history of paroxysmal atrial fibrillation treated with Eliquis, atenolol, and Tikosyn     I discussed the assessment and treatment plan with the patient. The patient was provided an opportunity to ask questions and all were answered. The patient agreed with the plan and demonstrated an understanding of the instructions.   The patient was advised to call back or seek an in-person evaluation if the symptoms worsen or if the condition fails to improve as anticipated.     Carolann Littler, MD

## 2019-01-09 DIAGNOSIS — N400 Enlarged prostate without lower urinary tract symptoms: Secondary | ICD-10-CM | POA: Diagnosis not present

## 2019-01-09 DIAGNOSIS — N182 Chronic kidney disease, stage 2 (mild): Secondary | ICD-10-CM | POA: Diagnosis not present

## 2019-01-09 DIAGNOSIS — Z7952 Long term (current) use of systemic steroids: Secondary | ICD-10-CM | POA: Diagnosis not present

## 2019-01-09 DIAGNOSIS — Z96653 Presence of artificial knee joint, bilateral: Secondary | ICD-10-CM | POA: Diagnosis not present

## 2019-01-09 DIAGNOSIS — Z87440 Personal history of urinary (tract) infections: Secondary | ICD-10-CM | POA: Diagnosis not present

## 2019-01-09 DIAGNOSIS — Z7901 Long term (current) use of anticoagulants: Secondary | ICD-10-CM | POA: Diagnosis not present

## 2019-01-09 DIAGNOSIS — E7800 Pure hypercholesterolemia, unspecified: Secondary | ICD-10-CM | POA: Diagnosis not present

## 2019-01-09 DIAGNOSIS — Z9181 History of falling: Secondary | ICD-10-CM | POA: Diagnosis not present

## 2019-01-09 DIAGNOSIS — J4530 Mild persistent asthma, uncomplicated: Secondary | ICD-10-CM | POA: Diagnosis not present

## 2019-01-09 DIAGNOSIS — I480 Paroxysmal atrial fibrillation: Secondary | ICD-10-CM | POA: Diagnosis not present

## 2019-01-09 DIAGNOSIS — I2510 Atherosclerotic heart disease of native coronary artery without angina pectoris: Secondary | ICD-10-CM | POA: Diagnosis not present

## 2019-01-09 DIAGNOSIS — I130 Hypertensive heart and chronic kidney disease with heart failure and stage 1 through stage 4 chronic kidney disease, or unspecified chronic kidney disease: Secondary | ICD-10-CM | POA: Diagnosis not present

## 2019-01-09 DIAGNOSIS — F419 Anxiety disorder, unspecified: Secondary | ICD-10-CM | POA: Diagnosis not present

## 2019-01-09 DIAGNOSIS — Z20828 Contact with and (suspected) exposure to other viral communicable diseases: Secondary | ICD-10-CM | POA: Diagnosis not present

## 2019-01-09 DIAGNOSIS — I272 Pulmonary hypertension, unspecified: Secondary | ICD-10-CM | POA: Diagnosis not present

## 2019-01-09 DIAGNOSIS — M1811 Unilateral primary osteoarthritis of first carpometacarpal joint, right hand: Secondary | ICD-10-CM | POA: Diagnosis not present

## 2019-01-09 DIAGNOSIS — Z8546 Personal history of malignant neoplasm of prostate: Secondary | ICD-10-CM | POA: Diagnosis not present

## 2019-01-09 DIAGNOSIS — I5032 Chronic diastolic (congestive) heart failure: Secondary | ICD-10-CM | POA: Diagnosis not present

## 2019-01-09 DIAGNOSIS — K219 Gastro-esophageal reflux disease without esophagitis: Secondary | ICD-10-CM | POA: Diagnosis not present

## 2019-01-09 DIAGNOSIS — F329 Major depressive disorder, single episode, unspecified: Secondary | ICD-10-CM | POA: Diagnosis not present

## 2019-01-10 ENCOUNTER — Ambulatory Visit (HOSPITAL_COMMUNITY)
Admission: RE | Admit: 2019-01-10 | Discharge: 2019-01-10 | Disposition: A | Discharge status: Home / Self Care | Payer: PPO | Source: Ambulatory Visit | Attending: Cardiology | Admitting: Cardiology

## 2019-01-10 ENCOUNTER — Encounter (HOSPITAL_COMMUNITY): Payer: Self-pay | Admitting: Cardiology

## 2019-01-10 ENCOUNTER — Other Ambulatory Visit: Payer: Self-pay

## 2019-01-10 VITALS — BP 100/60 | HR 62 | Wt 156.4 lb

## 2019-01-10 DIAGNOSIS — I5032 Chronic diastolic (congestive) heart failure: Secondary | ICD-10-CM | POA: Insufficient documentation

## 2019-01-10 DIAGNOSIS — Z8546 Personal history of malignant neoplasm of prostate: Secondary | ICD-10-CM | POA: Insufficient documentation

## 2019-01-10 DIAGNOSIS — E785 Hyperlipidemia, unspecified: Secondary | ICD-10-CM | POA: Diagnosis not present

## 2019-01-10 DIAGNOSIS — I480 Paroxysmal atrial fibrillation: Secondary | ICD-10-CM | POA: Insufficient documentation

## 2019-01-10 DIAGNOSIS — J45909 Unspecified asthma, uncomplicated: Secondary | ICD-10-CM | POA: Insufficient documentation

## 2019-01-10 DIAGNOSIS — Z7951 Long term (current) use of inhaled steroids: Secondary | ICD-10-CM | POA: Diagnosis not present

## 2019-01-10 DIAGNOSIS — K219 Gastro-esophageal reflux disease without esophagitis: Secondary | ICD-10-CM | POA: Diagnosis not present

## 2019-01-10 DIAGNOSIS — Z7901 Long term (current) use of anticoagulants: Secondary | ICD-10-CM | POA: Insufficient documentation

## 2019-01-10 DIAGNOSIS — Z8673 Personal history of transient ischemic attack (TIA), and cerebral infarction without residual deficits: Secondary | ICD-10-CM | POA: Insufficient documentation

## 2019-01-10 DIAGNOSIS — N189 Chronic kidney disease, unspecified: Secondary | ICD-10-CM | POA: Diagnosis not present

## 2019-01-10 DIAGNOSIS — Z79899 Other long term (current) drug therapy: Secondary | ICD-10-CM | POA: Diagnosis not present

## 2019-01-10 DIAGNOSIS — Z7902 Long term (current) use of antithrombotics/antiplatelets: Secondary | ICD-10-CM | POA: Diagnosis not present

## 2019-01-10 DIAGNOSIS — I130 Hypertensive heart and chronic kidney disease with heart failure and stage 1 through stage 4 chronic kidney disease, or unspecified chronic kidney disease: Secondary | ICD-10-CM | POA: Insufficient documentation

## 2019-01-10 DIAGNOSIS — Z955 Presence of coronary angioplasty implant and graft: Secondary | ICD-10-CM | POA: Diagnosis not present

## 2019-01-10 DIAGNOSIS — N403 Nodular prostate with lower urinary tract symptoms: Secondary | ICD-10-CM | POA: Diagnosis not present

## 2019-01-10 DIAGNOSIS — N401 Enlarged prostate with lower urinary tract symptoms: Secondary | ICD-10-CM | POA: Diagnosis not present

## 2019-01-10 DIAGNOSIS — I491 Atrial premature depolarization: Secondary | ICD-10-CM | POA: Insufficient documentation

## 2019-01-10 DIAGNOSIS — Z9861 Coronary angioplasty status: Secondary | ICD-10-CM

## 2019-01-10 DIAGNOSIS — C61 Malignant neoplasm of prostate: Secondary | ICD-10-CM | POA: Diagnosis not present

## 2019-01-10 DIAGNOSIS — R351 Nocturia: Secondary | ICD-10-CM | POA: Diagnosis not present

## 2019-01-10 DIAGNOSIS — I2510 Atherosclerotic heart disease of native coronary artery without angina pectoris: Secondary | ICD-10-CM | POA: Diagnosis not present

## 2019-01-10 DIAGNOSIS — Z9079 Acquired absence of other genital organ(s): Secondary | ICD-10-CM | POA: Insufficient documentation

## 2019-01-10 LAB — BASIC METABOLIC PANEL
Anion gap: 9 (ref 5–15)
BUN: 18 mg/dL (ref 8–23)
CO2: 28 mmol/L (ref 22–32)
Calcium: 9 mg/dL (ref 8.9–10.3)
Chloride: 102 mmol/L (ref 98–111)
Creatinine, Ser: 1.21 mg/dL (ref 0.61–1.24)
GFR calc Af Amer: >60 mL/min (ref >60)
GFR calc non Af Amer: 54 mL/min — ABNORMAL LOW (ref >60)
Glucose, Bld: 103 mg/dL — ABNORMAL HIGH (ref 70–99)
Potassium: 4.5 mmol/L (ref 3.5–5.1)
Sodium: 139 mmol/L (ref 135–145)

## 2019-01-10 LAB — MAGNESIUM: Magnesium: 1.8 mg/dL (ref 1.7–2.4)

## 2019-01-10 NOTE — Patient Instructions (Signed)
Routine lab work today. Will notify you of abnormal results  Please keep your follow up appointment in February.

## 2019-01-11 ENCOUNTER — Telehealth (HOSPITAL_COMMUNITY): Payer: Self-pay

## 2019-01-11 MED ORDER — MAGNESIUM OXIDE 400 (241.3 MG) MG PO TABS: 400.0000 mg | ORAL_TABLET | Freq: Every day | ORAL | 4 refills | Status: DC

## 2019-01-11 NOTE — Telephone Encounter (Signed)
-----   Message from Larey Dresser, MD sent at 01/10/2019 10:38 PM EST ----- Labs overall ok, would take magnesium oxide 400 mg daily with goal to get Mg > 2.

## 2019-01-11 NOTE — Telephone Encounter (Signed)
Pt aware of results and recommendation to start mag ox daily. Verbalized understanding.

## 2019-01-11 NOTE — Progress Notes (Signed)
Patient ID: [name] Spence, MD, male   DOB: [birth year], 85 y.o.   MRN: [id number] PCP: Dr. Sherren Mocha Cardiology: Dr. 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  He had an RV infarct with chronic total occlusion of a small nondominant RCA in the 1980s.  He had a drug-eluting stent to the CFX in 11/10. He is very symptomatic with atrial fibrillation episodes. He has had a chronic pattern of mild shortness of breath and mild left shoulder pain/chest aching when walking up a hill (x 20 years).   He can walk 3 miles on flat ground without problems.  He golfs 3-4 times a week.  He walks for 20-30 minutes on a treadmill several times a week.   Echo in 7/16 showed normal LV systolic function, EF 123456 with moderate diastolic dysfunction.  Moderate TR with PA systolic pressure 65 mmHg.  Normal RV size and systolic function.  Given volume overload on exam and abnormal echo, he was started on low dose Lasix in the past.    He was started on Multaq but continued to have episodes of atrial fibrillation.  This was stopped, and he was admitted for Tikosyn loading.  After he went home, he developed left facial droop and left arm weakness.  He was found to have a small right parietal cortical infarct with hemorrhagic conversion.  His symptoms resolved quickly. INR was subtherapeutic at admission.  Warfarin was stopped and he was put on apixaban.   In 9/17, he was admitted with LLL PNA.  During the admission, he had a couple of relatively short runs of atrial fibrillation.  He developed more frequent episodes of atrial fibrillation at home and was started on Tikosyn.   In 5/19, he had bilateral orchiectomy for metastatic prostate cancer.  His PSA has come down considerably.   In 1/20, he had influenza that progressed to PNA.  He was hospitalized with transient atrial fibrillation.    In 11/20, he was admitted with  an E coli UTI and urosepsis with hypotension and AKI.  He was initially fluid-resuscitated and later required diuresis.  He was in atrial fibrillation while in the hospital.   He is back in sinus rhythm today.  He fatigues more easily since his hospitalization.  No dyspnea walking on flat ground but he does get mildly short of breath walking up a flight of stairs.  Weight is down 4 lbs.  He is taking Lasix every other day. No orthopnea/paroxysmal nocturnal dyspnea, no chest pain.  He is not getting lightheaded. He is getting home PT.   ECG (personally reviewed): NSR, QTc 455 msec, nonspecific T wave flattening.    Labs (6/11): LDL 63, HDL 35, K 5.5, creatinine 1.1 Labs (6/12): LDL 58, HDL 53, TSH normal, K 4.7, creatinine 1 Labs (12/12): BNP 79 Labs (7/14): K 5, creatinine 1.1, LDL 48, HDL 34 Labs (8/15): LDL 48, HDL 33, TSH normal, K 5.4, creatinine 1.2 Labs (6/16): K 5.1, creatinine 1.27, HCT 42.5 Labs (10/16): LDL 37, HDL 32 Labs (11/16): K 4.4, creatinine 1.36, BNP 103 Labs  (2/17): K 4.5, creatinine 1.38, Mg 2.0, LDL 32 Labs (3/17): HCT 41.7 Labs (5/17): K 4.2, creatinine 1.17, Mg 2.1 Labs (9/17): K 4.2, creatinine 1.16, HCT 37.3 Labs (11/17): hgb 13.3, LDL 40 Labs (5/18): K 4.5, creatinine 1.25 Labs (5/19): K 5.5, creatinine 1.16 Labs (2/20): K 4, creatinine 1.24, hgb 12.2 Labs (  11/20): K 3.4, creatinine 1.34, hgb 10.3, LDL 49  PMH: 1. CAD: RV infarct in the 1980s.  Chronic total occlusion of a small nondominant RCA.  11/10 PCI to CFX with Xience DES.  EF 60% on LV-gram at that time. ETT (11/14) with 9'07" exercise, no significant ST depression.  2.  Paroxysmal atrial fibrillation: Multaq was ineffective. Now on Tikosyn.  3.  Hyperlipidemia 4. GERD 5. HTN 6. Asthma 7. Chronic diastolic CHF: Echo (XX123456): EF 55-60%, mild LV hypertrophy, no regional WMAs, mild AI, mild MR, mild-moderate biatrial enlargement, PA systolic pressure 51 mmHg. Echo (7/16) with EF 60-65%, moderate  diastolic dysfunction, normal RV size and systolic function, mild-moderate MR, moderate TR, moderate biatrial enlargement, PA systolic pressure 65 mmHg.  - Echo (5/18): EF 55-60%, mild AI, mild MR, PASP 60 mmHg, normal RV size and systolic function.  - Echo (8/19): EF 60-65%, normal RV size and systolic function, severe TR with PASP 60 mmHg.  8. CKD 9. CVA: Right parietal cortical CVA with peri-infarct hemorrhagic conversion.  CTA showed right M2 branch flow gap.   10. Prostate cancer: Metastatic.  - Bilateral orchiectomy in 5/19.   SH: Nonsmoker.  Retired Stage manager, lives in [place].  2 daughters.  Originally from [place].   FH: Brother with CABG.   ROS: All systems reviewed and negative except as per HPI.    Current Outpatient Medications  Medication Sig Dispense Refill  . apixaban (ELIQUIS) 5 MG TABS tablet Take 1 tablet (5 mg total) by mouth 2 (two) times daily. Needs apt for further refills 180 tablet 0  . atenolol (TENORMIN) 25 MG tablet TAKE 1 TABLET BY MOUTH DAILY. 90 tablet 3  . atorvastatin (LIPITOR) 20 MG tablet TAKE 1 TABLET(20 MG) BY MOUTH DAILY 90 tablet 0  . buPROPion (WELLBUTRIN SR) 150 MG 12 hr tablet Take 1 tablet (150 mg total) by mouth daily. 90 tablet 3  . cetirizine (ZYRTEC) 10 MG tablet Take 10 mg by mouth daily with supper.     . Cholecalciferol (VITAMIN D) 2000 units tablet Take 2,000 Units by mouth daily.    Marland Kitchen diltiazem (CARDIZEM) 30 MG tablet Take 30 mg by mouth as needed.    . dofetilide (TIKOSYN) 250 MCG capsule Take 1 capsule (250 mcg total) by mouth 2 (two) times daily. Needs appt for further refills 180 capsule 0  . fluticasone (FLONASE) 50 MCG/ACT nasal spray Place 2 sprays into both nostrils daily. 48 g 3  . Fluticasone Furoate (ARNUITY ELLIPTA) 200 MCG/ACT AEPB Inhale 1 puff into the lungs daily. 2 each 0  . furosemide (LASIX) 20 MG tablet Lasix 20 mg daily + KCl 10 mEq daily x 3 days. Then Lasix 20 mg every other day + KCl 10 mEq every other day  30 tablet 0  . LORazepam (ATIVAN) 1 MG tablet TAKE 1/2 TO 1 TABLET BY MOUTH AT BEDTIME AS NEEDED 90 tablet 1  . nitroGLYCERIN (NITROSTAT) 0.4 MG SL tablet Place 1 tablet (0.4 mg total) under the tongue every 5 (five) minutes x 3 doses as needed for chest pain. 25 tablet 2  . omeprazole (PRILOSEC OTC) 20 MG tablet Take 1 tablet (20 mg total) by mouth daily. 100 tablet 4  . potassium chloride (MICRO-K) 10 MEQ CR capsule Lasix 20 mg daily + KCl 10 mEq daily x 3 days. Then Lasix 20 mg every other day + KCl 10 mEq every other day 30 capsule 0  . tamsulosin (FLOMAX) 0.4 MG CAPS capsule Take 1 capsule (0.4  mg total) by mouth daily. 90 capsule 3  . traMADol (ULTRAM) 50 MG tablet Take 1 tablet (50 mg total) by mouth every 12 (twelve) hours as needed. 120 tablet 5   No current facility-administered medications for this encounter.     BP 100/60   Pulse 62   Wt 70.9 kg (156 lb 6.4 oz)   SpO2 98%   BMI 23.10 kg/m  General: NAD Neck: No JVD, no thyromegaly or thyroid nodule.  Lungs: Clear to auscultation bilaterally with normal respiratory effort. CV: Nondisplaced PMI.  Heart regular S1/S2, no S3/S4, 2/6 HSM LLSB.  2+ ankle edema.  No carotid bruit.  Normal pedal pulses.  Abdomen: Soft, nontender, no hepatosplenomegaly, no distention.  Skin: Intact without lesions or rashes.  Neurologic: Alert and oriented x 3.  Psych: Normal affect. Extremities: No clubbing or cyanosis.  HEENT: Normal.   Assessment/Plan: 1. CAD: No chest pain.    - He is on apixaban without ASA given stable CAD.   - Continue statin, beta blocker.  2. Hyperlipidemia: LDL was at goal (49) in 11/20.  3. Chronic diastolic CHF:  Most recent echo in 8/19 showed EF 123456 but PA systolic pressure was estimated at 60 mmHg and there was severe TR (progressed from moderate), the RV looked normal.  I suspect that he has a degree of RV failure accounting for NYHA class II symptoms.  He is now on Lasix 20 mg every other day.  He has  peripheral edema but no significant JVD.   - Continue current Lasix 20 mg qod + KCL 10 mEq qod.  BMET today.  - He wants to hold off on repeat echo.  Given age, think this is reasonable as unlikely to change management much.   4. Atrial fibrillation: Paroxysmal.  Episodes are quite symptomatic, he feels "bad" with atrial fibrillation even when he's rate-controlled. He had frequent breakthrough on Multaq but seems to be doing much better on Tikosyn.  He had breakthrough atrial fibrillation with recent UTI but is back in NSR.  - Continue Tikosyn.  BMET/Mg today.  QTc ok on today's ECG.  - If has frequent breakthrough afib on Tikosyn, amiodarone would be an option.  - Now on apixaban with CVA on warfarin (was subtherapeutic).  5. HTN: BP controlled.    Followup in 3 months.     Loralie Champagne 01/11/2019

## 2019-01-15 DIAGNOSIS — Z9181 History of falling: Secondary | ICD-10-CM | POA: Diagnosis not present

## 2019-01-15 DIAGNOSIS — I130 Hypertensive heart and chronic kidney disease with heart failure and stage 1 through stage 4 chronic kidney disease, or unspecified chronic kidney disease: Secondary | ICD-10-CM | POA: Diagnosis not present

## 2019-01-15 DIAGNOSIS — J4530 Mild persistent asthma, uncomplicated: Secondary | ICD-10-CM | POA: Diagnosis not present

## 2019-01-15 DIAGNOSIS — Z20828 Contact with and (suspected) exposure to other viral communicable diseases: Secondary | ICD-10-CM | POA: Diagnosis not present

## 2019-01-15 DIAGNOSIS — N182 Chronic kidney disease, stage 2 (mild): Secondary | ICD-10-CM | POA: Diagnosis not present

## 2019-01-15 DIAGNOSIS — Z87440 Personal history of urinary (tract) infections: Secondary | ICD-10-CM | POA: Diagnosis not present

## 2019-01-15 DIAGNOSIS — F329 Major depressive disorder, single episode, unspecified: Secondary | ICD-10-CM | POA: Diagnosis not present

## 2019-01-15 DIAGNOSIS — Z96653 Presence of artificial knee joint, bilateral: Secondary | ICD-10-CM | POA: Diagnosis not present

## 2019-01-15 DIAGNOSIS — Z8546 Personal history of malignant neoplasm of prostate: Secondary | ICD-10-CM | POA: Diagnosis not present

## 2019-01-15 DIAGNOSIS — Z7952 Long term (current) use of systemic steroids: Secondary | ICD-10-CM | POA: Diagnosis not present

## 2019-01-15 DIAGNOSIS — E7800 Pure hypercholesterolemia, unspecified: Secondary | ICD-10-CM | POA: Diagnosis not present

## 2019-01-15 DIAGNOSIS — I272 Pulmonary hypertension, unspecified: Secondary | ICD-10-CM | POA: Diagnosis not present

## 2019-01-15 DIAGNOSIS — K219 Gastro-esophageal reflux disease without esophagitis: Secondary | ICD-10-CM | POA: Diagnosis not present

## 2019-01-15 DIAGNOSIS — N400 Enlarged prostate without lower urinary tract symptoms: Secondary | ICD-10-CM | POA: Diagnosis not present

## 2019-01-15 DIAGNOSIS — F419 Anxiety disorder, unspecified: Secondary | ICD-10-CM | POA: Diagnosis not present

## 2019-01-15 DIAGNOSIS — I5032 Chronic diastolic (congestive) heart failure: Secondary | ICD-10-CM | POA: Diagnosis not present

## 2019-01-15 DIAGNOSIS — I480 Paroxysmal atrial fibrillation: Secondary | ICD-10-CM | POA: Diagnosis not present

## 2019-01-15 DIAGNOSIS — Z7901 Long term (current) use of anticoagulants: Secondary | ICD-10-CM | POA: Diagnosis not present

## 2019-01-15 DIAGNOSIS — M1811 Unilateral primary osteoarthritis of first carpometacarpal joint, right hand: Secondary | ICD-10-CM | POA: Diagnosis not present

## 2019-01-15 DIAGNOSIS — I2510 Atherosclerotic heart disease of native coronary artery without angina pectoris: Secondary | ICD-10-CM | POA: Diagnosis not present

## 2019-01-22 ENCOUNTER — Encounter: Payer: Self-pay | Admitting: Family Medicine

## 2019-01-23 DIAGNOSIS — I2510 Atherosclerotic heart disease of native coronary artery without angina pectoris: Secondary | ICD-10-CM | POA: Diagnosis not present

## 2019-01-23 DIAGNOSIS — K219 Gastro-esophageal reflux disease without esophagitis: Secondary | ICD-10-CM | POA: Diagnosis not present

## 2019-01-23 DIAGNOSIS — I480 Paroxysmal atrial fibrillation: Secondary | ICD-10-CM | POA: Diagnosis not present

## 2019-01-23 DIAGNOSIS — Z7901 Long term (current) use of anticoagulants: Secondary | ICD-10-CM | POA: Diagnosis not present

## 2019-01-23 DIAGNOSIS — Z7952 Long term (current) use of systemic steroids: Secondary | ICD-10-CM | POA: Diagnosis not present

## 2019-01-23 DIAGNOSIS — I5032 Chronic diastolic (congestive) heart failure: Secondary | ICD-10-CM | POA: Diagnosis not present

## 2019-01-23 DIAGNOSIS — M1811 Unilateral primary osteoarthritis of first carpometacarpal joint, right hand: Secondary | ICD-10-CM | POA: Diagnosis not present

## 2019-01-23 DIAGNOSIS — I130 Hypertensive heart and chronic kidney disease with heart failure and stage 1 through stage 4 chronic kidney disease, or unspecified chronic kidney disease: Secondary | ICD-10-CM | POA: Diagnosis not present

## 2019-01-23 DIAGNOSIS — Z8546 Personal history of malignant neoplasm of prostate: Secondary | ICD-10-CM | POA: Diagnosis not present

## 2019-01-23 DIAGNOSIS — F419 Anxiety disorder, unspecified: Secondary | ICD-10-CM | POA: Diagnosis not present

## 2019-01-23 DIAGNOSIS — N400 Enlarged prostate without lower urinary tract symptoms: Secondary | ICD-10-CM | POA: Diagnosis not present

## 2019-01-23 DIAGNOSIS — N182 Chronic kidney disease, stage 2 (mild): Secondary | ICD-10-CM | POA: Diagnosis not present

## 2019-01-23 DIAGNOSIS — Z20828 Contact with and (suspected) exposure to other viral communicable diseases: Secondary | ICD-10-CM | POA: Diagnosis not present

## 2019-01-23 DIAGNOSIS — I272 Pulmonary hypertension, unspecified: Secondary | ICD-10-CM | POA: Diagnosis not present

## 2019-01-23 DIAGNOSIS — Z96653 Presence of artificial knee joint, bilateral: Secondary | ICD-10-CM | POA: Diagnosis not present

## 2019-01-23 DIAGNOSIS — F329 Major depressive disorder, single episode, unspecified: Secondary | ICD-10-CM | POA: Diagnosis not present

## 2019-01-23 DIAGNOSIS — E7800 Pure hypercholesterolemia, unspecified: Secondary | ICD-10-CM | POA: Diagnosis not present

## 2019-01-23 DIAGNOSIS — Z87440 Personal history of urinary (tract) infections: Secondary | ICD-10-CM | POA: Diagnosis not present

## 2019-01-23 DIAGNOSIS — J4530 Mild persistent asthma, uncomplicated: Secondary | ICD-10-CM | POA: Diagnosis not present

## 2019-01-23 DIAGNOSIS — Z9181 History of falling: Secondary | ICD-10-CM | POA: Diagnosis not present

## 2019-02-13 ENCOUNTER — Encounter: Payer: Self-pay | Admitting: Family Medicine

## 2019-03-12 ENCOUNTER — Other Ambulatory Visit: Payer: Self-pay | Admitting: Family Medicine

## 2019-03-12 NOTE — Telephone Encounter (Signed)
Refill for one year 

## 2019-03-12 NOTE — Telephone Encounter (Signed)
Okay to continue this medication dosage per Lipid panel from hospital visit?

## 2019-03-15 ENCOUNTER — Telehealth (HOSPITAL_COMMUNITY): Payer: Self-pay

## 2019-03-15 NOTE — Telephone Encounter (Signed)

## 2019-03-16 ENCOUNTER — Other Ambulatory Visit: Payer: Self-pay

## 2019-03-16 ENCOUNTER — Encounter (HOSPITAL_COMMUNITY): Payer: Self-pay | Admitting: Cardiology

## 2019-03-16 ENCOUNTER — Ambulatory Visit (HOSPITAL_COMMUNITY)
Admission: RE | Admit: 2019-03-16 | Discharge: 2019-03-16 | Disposition: A | Discharge status: Home / Self Care | Payer: PPO | Source: Ambulatory Visit | Attending: Cardiology | Admitting: Cardiology

## 2019-03-16 VITALS — BP 102/64 | HR 60 | Wt 156.0 lb

## 2019-03-16 DIAGNOSIS — Z79899 Other long term (current) drug therapy: Secondary | ICD-10-CM | POA: Insufficient documentation

## 2019-03-16 DIAGNOSIS — Z7901 Long term (current) use of anticoagulants: Secondary | ICD-10-CM | POA: Diagnosis not present

## 2019-03-16 DIAGNOSIS — E785 Hyperlipidemia, unspecified: Secondary | ICD-10-CM | POA: Diagnosis not present

## 2019-03-16 DIAGNOSIS — I4891 Unspecified atrial fibrillation: Secondary | ICD-10-CM | POA: Insufficient documentation

## 2019-03-16 DIAGNOSIS — I2510 Atherosclerotic heart disease of native coronary artery without angina pectoris: Secondary | ICD-10-CM | POA: Insufficient documentation

## 2019-03-16 DIAGNOSIS — I5032 Chronic diastolic (congestive) heart failure: Secondary | ICD-10-CM | POA: Diagnosis not present

## 2019-03-16 DIAGNOSIS — Z8546 Personal history of malignant neoplasm of prostate: Secondary | ICD-10-CM | POA: Insufficient documentation

## 2019-03-16 DIAGNOSIS — Z8673 Personal history of transient ischemic attack (TIA), and cerebral infarction without residual deficits: Secondary | ICD-10-CM | POA: Diagnosis not present

## 2019-03-16 DIAGNOSIS — N189 Chronic kidney disease, unspecified: Secondary | ICD-10-CM | POA: Diagnosis not present

## 2019-03-16 DIAGNOSIS — I480 Paroxysmal atrial fibrillation: Secondary | ICD-10-CM | POA: Diagnosis not present

## 2019-03-16 DIAGNOSIS — I130 Hypertensive heart and chronic kidney disease with heart failure and stage 1 through stage 4 chronic kidney disease, or unspecified chronic kidney disease: Secondary | ICD-10-CM | POA: Diagnosis not present

## 2019-03-16 DIAGNOSIS — J45909 Unspecified asthma, uncomplicated: Secondary | ICD-10-CM | POA: Insufficient documentation

## 2019-03-16 LAB — BASIC METABOLIC PANEL
Anion gap: 8 (ref 5–15)
BUN: 32 mg/dL — ABNORMAL HIGH (ref 8–23)
CO2: 23 mmol/L (ref 22–32)
Calcium: 9.1 mg/dL (ref 8.9–10.3)
Chloride: 109 mmol/L (ref 98–111)
Creatinine, Ser: 1.5 mg/dL — ABNORMAL HIGH (ref 0.61–1.24)
GFR calc Af Amer: 48 mL/min — ABNORMAL LOW (ref >60)
GFR calc non Af Amer: 41 mL/min — ABNORMAL LOW (ref >60)
Glucose, Bld: 97 mg/dL (ref 70–99)
Potassium: 3.9 mmol/L (ref 3.5–5.1)
Sodium: 140 mmol/L (ref 135–145)

## 2019-03-16 LAB — TSH: TSH: 4.512 u[IU]/mL — ABNORMAL HIGH (ref 0.350–4.500)

## 2019-03-16 LAB — CBC
HCT: 36.2 % — ABNORMAL LOW (ref 39.0–52.0)
Hemoglobin: 11 g/dL — ABNORMAL LOW (ref 13.0–17.0)
MCH: 29.3 pg (ref 26.0–34.0)
MCHC: 30.4 g/dL (ref 30.0–36.0)
MCV: 96.5 fL (ref 80.0–100.0)
Platelets: 239 10*3/uL (ref 150–400)
RBC: 3.75 MIL/uL — ABNORMAL LOW (ref 4.22–5.81)
RDW: 14.2 % (ref 11.5–15.5)
WBC: 8.4 10*3/uL (ref 4.0–10.5)
nRBC: 0 % (ref 0.0–0.2)

## 2019-03-16 LAB — MAGNESIUM: Magnesium: 1.9 mg/dL (ref 1.7–2.4)

## 2019-03-16 MED ORDER — DILTIAZEM HCL ER COATED BEADS 120 MG PO CP24: 120.0000 mg | ORAL_CAPSULE | Freq: Every day | ORAL | 11 refills | Status: DC

## 2019-03-16 MED ORDER — ATENOLOL 25 MG PO TABS: 12.5000 mg | ORAL_TABLET | Freq: Every day | ORAL | 3 refills | Status: DC

## 2019-03-16 NOTE — Patient Instructions (Signed)
DECREASE Atenolol to 12.5mg  (1/2 tab) daily    START Diltiazem CD 120mg  (1 tab) daily   Labs today We will only contact you if something comes back abnormal or we need to make some changes. Otherwise no news is good news!   Your physician recommends that you schedule a follow-up appointment in: 3 months with Dr Aundra Dubin   Please call office at [phone] option 2 if you have any questions or concerns.    At the [facility], you and your health needs are our priority. As part of our continuing mission to provide you with exceptional heart care, we have created designated Provider Care Teams. These Care Teams include your primary Cardiologist (physician) and Advanced Practice Providers (APPs- Physician Assistants and Nurse Practitioners) who all work together to provide you with the care you need, when you need it.   You may see any of the following providers on your designated Care Team at your next follow up: Marland Kitchen Dr Glori Bickers . Dr Loralie Champagne . Darrick Grinder, NP . Lyda Jester, PA . Audry Riles, PharmD   Please be sure to bring in all your medications bottles to every appointment.

## 2019-03-18 NOTE — Progress Notes (Signed)
Patient ID: [name] Spence, MD, male   DOB: [birth year], 86 y.o.   MRN: [id number] PCP: Dr. Sherren Mocha Cardiology: Dr. 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  He had an RV infarct with chronic total occlusion of a small nondominant RCA in the 1980s.  He had a drug-eluting stent to the CFX in 11/10. He is very symptomatic with atrial fibrillation episodes. He has had a chronic pattern of mild shortness of breath and mild left shoulder pain/chest aching when walking up a hill (x 20 years).   He can walk 3 miles on flat ground without problems.  He golfs 3-4 times a week.  He walks for 20-30 minutes on a treadmill several times a week.   Echo in 7/16 showed normal LV systolic function, EF 123456 with moderate diastolic dysfunction.  Moderate TR with PA systolic pressure 65 mmHg.  Normal RV size and systolic function.  Given volume overload on exam and abnormal echo, he was started on low dose Lasix in the past.    He was started on Multaq but continued to have episodes of atrial fibrillation.  This was stopped, and he was admitted for Tikosyn loading.  After he went home, he developed left facial droop and left arm weakness.  He was found to have a small right parietal cortical infarct with hemorrhagic conversion.  His symptoms resolved quickly. INR was subtherapeutic at admission.  Warfarin was stopped and he was put on apixaban.   In 9/17, he was admitted with LLL PNA.  During the admission, he had a couple of relatively short runs of atrial fibrillation.  He developed more frequent episodes of atrial fibrillation at home and was started on Tikosyn.   In 5/19, he had bilateral orchiectomy for metastatic prostate cancer.  His PSA has come down considerably.   In 1/20, he had influenza that progressed to PNA.  He was hospitalized with transient atrial fibrillation.    In 11/20, he was admitted with  an E coli UTI and urosepsis with hypotension and AKI.  He was initially fluid-resuscitated and later required diuresis.  He was in atrial fibrillation while in the hospital.   He is in NSR today but feels like he has been in atrial fibrillation more frequently recently.  Atrial fibrillation typically lasts 2-3 hours and will often resolve when he takes a dose of diltiazem.  He feels like he had atrial fibrillation about 5 times last week.  He played 13 holes of golf yesterday, was tired afterwards but no dyspnea or chest pain.  He is not taking Lasix.   ECG (personally reviewed): NSR, nonspecific T wave flattening, QTc 453 msec    Labs (6/11): LDL 63, HDL 35, K 5.5, creatinine 1.1 Labs (6/12): LDL 58, HDL 53, TSH normal, K 4.7, creatinine 1 Labs (12/12): BNP 79 Labs (7/14): K 5, creatinine 1.1, LDL 48, HDL 34 Labs (8/15): LDL 48, HDL 33, TSH normal, K 5.4, creatinine 1.2 Labs (6/16): K 5.1, creatinine 1.27, HCT 42.5 Labs (10/16): LDL 37, HDL 32 Labs (11/16): K 4.4, creatinine 1.36, BNP 103 Labs  (2/17): K 4.5, creatinine 1.38, Mg 2.0, LDL 32 Labs (3/17): HCT 41.7 Labs (5/17): K 4.2, creatinine 1.17, Mg 2.1 Labs (9/17): K 4.2, creatinine 1.16, HCT 37.3 Labs (11/17): hgb 13.3, LDL 40 Labs (5/18): K 4.5, creatinine 1.25 Labs (5/19): K 5.5, creatinine 1.16 Labs (2/20): K 4, creatinine 1.24,  hgb 12.2 Labs (11/20): K 3.4, creatinine 1.34, hgb 10.3, LDL 49 Labs (12/20): K 4.5, creatinine 1.2  PMH: 1. CAD: RV infarct in the 1980s.  Chronic total occlusion of a small nondominant RCA.  11/10 PCI to CFX with Xience DES.  EF 60% on LV-gram at that time. ETT (11/14) with 9'07" exercise, no significant ST depression.  2.  Paroxysmal atrial fibrillation: Multaq was ineffective. Now on Tikosyn.  3.  Hyperlipidemia 4. GERD 5. HTN 6. Asthma 7. Chronic diastolic CHF: Echo ([id number]): EF 55-60%, mild LV hypertrophy, no regional WMAs, mild AI, mild MR, mild-moderate biatrial enlargement, PA systolic pressure  51 mmHg. Echo (7/16) with EF 60-65%, moderate diastolic dysfunction, normal RV size and systolic function, mild-moderate MR, moderate TR, moderate biatrial enlargement, PA systolic pressure 65 mmHg.  - Echo (5/18): EF 55-60%, mild AI, mild MR, PASP 60 mmHg, normal RV size and systolic function.  - Echo (8/19): EF 60-65%, normal RV size and systolic function, severe TR with PASP 60 mmHg.  8. CKD 9. CVA: Right parietal cortical CVA with peri-infarct hemorrhagic conversion.  CTA showed right M2 branch flow gap.   10. Prostate cancer: Metastatic.  - Bilateral orchiectomy in 5/19.   SH: Nonsmoker.  Retired Stage manager, lives in [place].  2 daughters.  Originally from [place].   FH: Brother with CABG.   ROS: All systems reviewed and negative except as per HPI.    Current Outpatient Medications  Medication Sig Dispense Refill  . apixaban (ELIQUIS) 5 MG TABS tablet Take 1 tablet (5 mg total) by mouth 2 (two) times daily. Needs apt for further refills 180 tablet 0  . atenolol (TENORMIN) 25 MG tablet Take 0.5 tablets (12.5 mg total) by mouth daily. 45 tablet 3  . atorvastatin (LIPITOR) 20 MG tablet TAKE 1 TABLET(20 MG) BY MOUTH DAILY 90 tablet 3  . buPROPion (WELLBUTRIN SR) 150 MG 12 hr tablet Take 1 tablet (150 mg total) by mouth daily. 90 tablet 3  . cetirizine (ZYRTEC) 10 MG tablet Take 10 mg by mouth daily with supper.     . Cholecalciferol (VITAMIN D) 2000 units tablet Take 2,000 Units by mouth daily.    Marland Kitchen diltiazem (CARDIZEM) 30 MG tablet Take 30 mg by mouth as needed.    . dofetilide (TIKOSYN) 250 MCG capsule Take 1 capsule (250 mcg total) by mouth 2 (two) times daily. Needs appt for further refills 180 capsule 0  . fluticasone (FLONASE) 50 MCG/ACT nasal spray Place 2 sprays into both nostrils daily. 48 g 3  . Fluticasone Furoate (ARNUITY ELLIPTA) 200 MCG/ACT AEPB Inhale 1 puff into the lungs daily. 2 each 0  . furosemide (LASIX) 20 MG tablet Lasix 20 mg daily + KCl 10 mEq daily x 3  days. Then Lasix 20 mg every other day + KCl 10 mEq every other day 30 tablet 0  . LORazepam (ATIVAN) 1 MG tablet TAKE 1/2 TO 1 TABLET BY MOUTH AT BEDTIME AS NEEDED 90 tablet 1  . magnesium oxide (MAG-OX) 400 (241.3 Mg) MG tablet Take 1 tablet (400 mg total) by mouth daily. 30 tablet 4  . nitroGLYCERIN (NITROSTAT) 0.4 MG SL tablet Place 1 tablet (0.4 mg total) under the tongue every 5 (five) minutes x 3 doses as needed for chest pain. 25 tablet 2  . omeprazole (PRILOSEC OTC) 20 MG tablet Take 1 tablet (20 mg total) by mouth daily. 100 tablet 4  . potassium chloride (MICRO-K) 10 MEQ CR capsule Lasix 20 mg daily + KCl 10  mEq daily x 3 days. Then Lasix 20 mg every other day + KCl 10 mEq every other day 30 capsule 0  . tamsulosin (FLOMAX) 0.4 MG CAPS capsule Take 1 capsule (0.4 mg total) by mouth daily. 90 capsule 3  . traMADol (ULTRAM) 50 MG tablet Take 1 tablet (50 mg total) by mouth every 12 (twelve) hours as needed. 120 tablet 5  . diltiazem (CARDIZEM CD) 120 MG 24 hr capsule Take 1 capsule (120 mg total) by mouth daily. 30 capsule 11   No current facility-administered medications for this encounter.    BP 102/64   Pulse 60   Wt 70.8 kg (156 lb)   SpO2 99%   BMI 23.04 kg/m  General: NAD Neck: No JVD, no thyromegaly or thyroid nodule.  Lungs: Clear to auscultation bilaterally with normal respiratory effort. CV: Nondisplaced PMI.  Heart regular S1/S2, no S3/S4, 1/6 HSM apex.  No peripheral edema.  No carotid bruit.  Normal pedal pulses.  Abdomen: Soft, nontender, no hepatosplenomegaly, no distention.  Skin: Intact without lesions or rashes.  Neurologic: Alert and oriented x 3.  Psych: Normal affect. Extremities: No clubbing or cyanosis.  HEENT: Normal.   Assessment/Plan: 1. CAD: No chest pain.    - He is on apixaban without ASA given stable CAD.   - Continue statin, beta blocker.  2. Hyperlipidemia: LDL was at goal (49) in 11/20.  3. Chronic diastolic CHF:  Most recent echo in 8/19  showed EF 123456 but PA systolic pressure was estimated at 60 mmHg and there was severe TR (progressed from moderate), the RV looked normal.  I suspect that he has a degree of RV failure accounting for NYHA class II symptoms.  He is not taking Lasix now and weight is stable.   - He wants to hold off on repeat echo.  Given age, think this is reasonable as unlikely to change management much.   4. Atrial fibrillation: Paroxysmal.  Episodes are quite symptomatic, he feels "bad" with atrial fibrillation even when he's rate-controlled. He had frequent breakthrough on Multaq but and is now on Tikosyn.  Recently, he has had episodes of atrial fibrillation lasting a couple of hours several times a week.  - Continue Tikosyn.  BMET/Mg today.  QTc ok on today's ECG.  - He feels like short-acting diltiazem taken prn helps to get him back in NSR. I will have him decrease atenolol to 12.5 mg daily and start diltiazem CD 120 mg daily.  - If he continues to frequent breakthrough afib on Tikosyn, amiodarone would be an option.  - Now on apixaban with CVA on warfarin (was subtherapeutic).  5. HTN: BP controlled.    Followup in 3 months.     Loralie Champagne 03/18/2019

## 2019-03-26 ENCOUNTER — Ambulatory Visit (INDEPENDENT_AMBULATORY_CARE_PROVIDER_SITE_OTHER): Payer: PPO

## 2019-03-26 VITALS — Ht 69.5 in | Wt 156.9 lb

## 2019-03-26 DIAGNOSIS — Z0000 Encounter for general adult medical examination without abnormal findings: Secondary | ICD-10-CM | POA: Diagnosis not present

## 2019-03-26 NOTE — Progress Notes (Addendum)
This visit is being conducted via phone call due to the COVID-19 pandemic. This patient has given me verbal consent via phone to conduct this visit, patient states they are participating from their home address. Some vital signs may be absent or patient reported.   Patient identification: identified by name, DOB, and current address.  Location provider: [facility], Office Persons participating in the virtual visit: Loyalty Brashier and Franne Forts, LPN.    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 He reports an increase in palpitations lasting 3-4 hours per episode and apple watch confirms a fib. He also reports "some orthostatic hypotension with it". He has recently seen cardiology for this. He is having 4-5 episodes of nocturia every night and has done this for a long time.   Review of Systems:  No ROS: Annual Medicare Wellness Visit       Objective:    Vitals: Ht 5' 9.5" (1.765 m)   Wt 156 lb 14.4 oz (71.2 kg)   BMI 22.84 kg/m   Body mass index is 22.84 kg/m.  Advanced Directives 03/26/2019 12/27/2018 03/20/2018 03/08/2018 02/27/2018 02/25/2018 06/10/2017  Does Patient Have a Medical Advance Directive? _0  Yes Yes  Type of Advance Directive - [place];Living will [place];Living will Living will;Healthcare Power of [place];Living will - [place];Living will  Does patient want to make changes to medical advance directive? No - Patient declined No - Patient declined No - Patient declined No - Patient declined No - Patient declined - -  Copy of [place] in Chart? - No - copy requested No - copy requested No - copy requested No - copy requested - No - copy requested  Would patient like information on creating a medical advance directive? - - - - - - -    Tobacco Social History   Tobacco Use   Smoking Status Never Smoker  Smokeless Tobacco Never Used     Counseling given: Not Answered   Clinical Intake:  Pre-visit preparation completed: Yes  Pain : No/denies pain     BMI - recorded: 22.84 Nutritional Status: BMI of 19-24  Normal Nutritional Risks: None Diabetes: No  How often do you need to have someone help you when you read instructions, pamphlets, or other written materials from your doctor or pharmacy?: 1 - Never  Interpreter Needed?: No  Information entered by :: Franne Forts, LPN.  Past Medical History:  Diagnosis Date  . Allergic rhinitis   . Anxiety   . Asthma   . Atrial fibrillation ([place])   . CAD (coronary artery disease)   . Depression   . Diverticulosis 03/16/1995  . DJD (degenerative joint disease)    "most joints" (03/11/2015)  . GERD (gastroesophageal reflux disease) 12/30/2000  . Heart attack ([place]) 1984  . Hiatal hernia 12/30/2000  . History of blood transfusion 1988   "related to GI bleeding OR"  . History of duodenal ulcer 10/15/1986  . Hyperlipidemia   . Hypertension   . Sepsis ([place]) 12/2018  . Sinus bradycardia   . Stroke [facility])    Past Surgical History:  Procedure Laterality Date  . CARDIAC CATHETERIZATION    . CATARACT EXTRACTION W/ INTRAOCULAR LENS  IMPLANT, BILATERAL Bilateral ?2013  . CHOLECYSTECTOMY OPEN  1989  . CORONARY ANGIOPLASTY WITH STENT PLACEMENT  2010  . hospital ccu  1984  heart attack, hypoplastic right coronary  . INGUINAL HERNIA REPAIR Right ~ 1990  . NASAL SEPTUM SURGERY  1972  . ORCHIECTOMY Bilateral 06/10/2017   Procedure: BILATERAL ORCHIECTOMY;  Surgeon: Franchot Gallo, MD;  Location: [facility] ORS;  Service: Urology;  Laterality: Bilateral;  MAC ANESTHESIA AND LOCAL  . TONSILLECTOMY    . TOTAL KNEE ARTHROPLASTY Bilateral 1998-2004   "right-left"  . VAGOTOMY AND PYLOROPLASTY  1988   "bleeding duodenal ulcer"   Family History  Problem Relation Age of Onset  . Coronary artery disease Mother         deceased  . Deep vein thrombosis Sister   . Coronary artery disease Brother   . Asthma Brother   . Colon cancer Neg Hx    Social History   Socioeconomic History  . Marital status: Married    Spouse name: Not on file  . Number of children: 2  . Years of education: Not on file  . Highest education level: Doctorate  Occupational History  . Occupation: DOCTOR    Employer: RETIRED    Comment: radiologist  Tobacco Use  . Smoking status: Never Smoker  . Smokeless tobacco: Never Used  Substance and Sexual Activity  . Alcohol use: No  . Drug use: No  . Sexual activity: Not Currently  Other Topics Concern  . Not on file  Social History Narrative   Married. Regular exercise - yes.       03/26/2019: Married to Therapist, sports, lives in 3 story home, although lives on first level mostly. Has two grown children.      Enjoys golfing; drives to [place] with wife every winter for a few days, watches sports, following Merchandiser, retail, reads about WWII,    Social Determinants of Radio broadcast assistant Strain:   . Difficulty of Paying Living Expenses: Not on file  Food Insecurity:   . Worried About Charity fundraiser in the Last Year: Not on file  . Ran Out of Food in the Last Year: Not on file  Transportation Needs:   . Lack of Transportation (Medical): Not on file  . Lack of Transportation (Non-Medical): Not on file  Physical Activity:   . Days of Exercise per Week: Not on file  . Minutes of Exercise per Session: Not on file  Stress:   . Feeling of Stress : Not on file  Social Connections:   . Frequency of Communication with Friends and Family: Not on file  . Frequency of Social Gatherings with Friends and Family: Not on file  . Attends Religious Services: Not on file  . Active Member of Clubs or Organizations: Not on file  . Attends Archivist Meetings: Not on file  . Marital Status: Not on file    Outpatient Encounter Medications as of 03/26/2019  Medication Sig  . apixaban  (ELIQUIS) 5 MG TABS tablet Take 1 tablet (5 mg total) by mouth 2 (two) times daily. Needs apt for further refills  . atenolol (TENORMIN) 25 MG tablet Take 0.5 tablets (12.5 mg total) by mouth daily.  Marland Kitchen atorvastatin (LIPITOR) 20 MG tablet TAKE 1 TABLET(20 MG) BY MOUTH DAILY  . buPROPion (WELLBUTRIN SR) 150 MG 12 hr tablet Take 1 tablet (150 mg total) by mouth daily.  . cetirizine (ZYRTEC) 10 MG tablet Take 10 mg by mouth daily with supper.   . Cholecalciferol (VITAMIN D) 2000 units tablet Take 2,000 Units by mouth daily.  Marland Kitchen diltiazem (CARDIZEM CD) 120 MG 24 hr capsule Take 1  capsule (120 mg total) by mouth daily.  Marland Kitchen diltiazem (CARDIZEM) 30 MG tablet Take 30 mg by mouth as needed.  . dofetilide (TIKOSYN) 250 MCG capsule Take 1 capsule (250 mcg total) by mouth 2 (two) times daily. Needs appt for further refills  . fluticasone (FLONASE) 50 MCG/ACT nasal spray Place 2 sprays into both nostrils daily.  . Fluticasone Furoate (ARNUITY ELLIPTA) 200 MCG/ACT AEPB Inhale 1 puff into the lungs daily.  Marland Kitchen LORazepam (ATIVAN) 1 MG tablet TAKE 1/2 TO 1 TABLET BY MOUTH AT BEDTIME AS NEEDED  . magnesium oxide (MAG-OX) 400 (241.3 Mg) MG tablet Take 1 tablet (400 mg total) by mouth daily.  . nitroGLYCERIN (NITROSTAT) 0.4 MG SL tablet Place 1 tablet (0.4 mg total) under the tongue every 5 (five) minutes x 3 doses as needed for chest pain.  Marland Kitchen omeprazole (PRILOSEC OTC) 20 MG tablet Take 1 tablet (20 mg total) by mouth daily.  . tamsulosin (FLOMAX) 0.4 MG CAPS capsule Take 1 capsule (0.4 mg total) by mouth daily.  . furosemide (LASIX) 20 MG tablet Lasix 20 mg daily + KCl 10 mEq daily x 3 days. Then Lasix 20 mg every other day + KCl 10 mEq every other day (Patient not taking: Reported on 03/26/2019)  . potassium chloride (MICRO-K) 10 MEQ CR capsule Lasix 20 mg daily + KCl 10 mEq daily x 3 days. Then Lasix 20 mg every other day + KCl 10 mEq every other day (Patient not taking: Reported on 03/26/2019)  . traMADol (ULTRAM) 50 MG  tablet Take 1 tablet (50 mg total) by mouth every 12 (twelve) hours as needed. (Patient not taking: Reported on 03/26/2019)   No facility-administered encounter medications on file as of 03/26/2019.    Activities of Daily Living In your present state of health, do you have any difficulty performing the following activities: 03/26/2019 12/27/2018  Hearing? N Y  Vision? N N  Difficulty concentrating or making decisions? N N  Walking or climbing stairs? N Y  Dressing or bathing? N N  Doing errands, shopping? N Y  Conservation officer, nature and eating ? N -  Using the Toilet? N -  In the past six months, have you accidently leaked urine? N -  Do you have problems with loss of bowel control? N -  Managing your Medications? N -  Managing your Finances? N -  Housekeeping or managing your Housekeeping? N -  Some recent data might be hidden    Patient Care Team: [name], MD as PCP - General (Family Medicine) Franchot Gallo, MD as Consulting Physician (Urology) Evans Lance, MD as Consulting Physician (Cardiology) Shon Hough, MD as Consulting Physician (Ophthalmology)   Assessment:   This is a routine wellness examination for [facility].  Exercise Activities and Dietary recommendations Current Exercise Habits: Structured exercise class, Type of exercise: calisthenics;walking, Time (Minutes): 30, Frequency (Times/Week): 3, Weekly Exercise (Minutes/Week): 90, Intensity: Moderate, Exercise limited by: None identified  Goals    . Patient Stated     Maintain health       Fall Risk Fall Risk  03/26/2019 03/08/2018 09/27/2016 04/12/2016 12/29/2015  Falls in the past year? 0 1 No No No  Number falls in past yr: - 0 - - -  Injury with Fall? - 1 - - -  Comment - rug burn - - -  Risk for fall due to : Medication side effect History of fall(s);Impaired balance/gait;Impaired vision - Other (Comment) -  Follow up Falls evaluation completed;Education provided;Falls prevention  discussed Falls  prevention discussed;Education provided - - -   Is the patient's home free of loose throw rugs in walkways, pet beds, electrical cords, etc?   yes      Grab bars in the bathroom? yes      Handrails on the stairs?   yes      Adequate lighting?   yes  Timed Get Up and Go Performed: N/A due to telephone visit.  Depression Screen PHQ 2/9 Scores 03/26/2019 03/08/2018 04/12/2016 12/29/2015  PHQ - 2 Score 0 1 0 0  PHQ- 9 Score - 3 - -  Exception Documentation - - Other- indicate reason in comment box -    Cognitive Function     6CIT Screen 03/26/2019  What Year? 0 points  What month? 0 points  What time? 0 points  Count back from 20 0 points  Months in reverse 0 points  Repeat phrase 0 points  Total Score 0    Immunization History  Administered Date(s) Administered  . Fluad Quad(high Dose 65+) 10/17/2018  . Influenza Split 10/23/2012, 10/02/2015  . Influenza Whole 10/27/2007, 11/13/2008, 10/29/2009, 11/09/2010  . Influenza, High Dose Seasonal PF 10/28/2016  . Influenza,inj,Quad PF,6+ Mos 10/19/2017  . Influenza-Unspecified 12/09/2013, 10/28/2014, 10/17/2018  . Pneumococcal Conjugate-13 09/17/2013  . Pneumococcal Polysaccharide-23 08/30/2000, 11/29/2005  . Td 06/20/2007, 01/11/2018  . Tdap 01/11/2018  . Zoster 06/20/2007  . Zoster Recombinat (Shingrix) 11/07/2017, 11/22/2017, 01/11/2018    Qualifies for Shingles Vaccine? Patient has completed Shingrix vaccine series.   Screening Tests Health Maintenance  Topic Date Due  . TETANUS/TDAP  02/05/2028  . INFLUENZA VACCINE  Completed  . PNA vac Low Risk Adult  Completed   Cancer Screenings: Lung: Low Dose CT Chest recommended if Age 26-80 years, 30 pack-year currently smoking OR have quit w/in 15years. Patient does not qualify. Colorectal: yes  Additional Screenings:  Hepatitis C Screening: N/A due to age.       Plan:    Dr. Caryn Section is up to date with all preventive health screenings and immunizations at this time.   I  have personally reviewed and noted the following in the patient's chart:   . Medical and social history . Use of alcohol, tobacco or illicit drugs  . Current medications and supplements . Functional ability and status . Nutritional status . Physical activity . Advanced directives . List of other physicians . Hospitalizations, surgeries, and ER visits in previous 12 months . Vitals . Screenings to include cognitive, depression, and falls . Referrals and appointments  In addition, I have reviewed and discussed with patient certain preventive protocols, quality metrics, and best practice recommendations. A written personalized care plan for preventive services as well as general preventive health recommendations were provided to patient.     Franne Forts, LPN  05/17/8117  I have reviewed the documentation for the [facility] and [place] provided by the health coach and agree with their documentation. I was immediately available for any questions  [name] MD [facility] at [facility]

## 2019-03-26 NOTE — Patient Instructions (Addendum)
[name] [name] , Thank you for taking time to participate in your Medicare Wellness Visit. I appreciate your ongoing commitment to your health goals. Please review the following plan we discussed and let me know if I can assist you in the future.   Screening recommendations/referrals: Colorectal Screening: colonoscopy completed 11/04/2000; no longer necessary due to advanced age.   Vision and Dental Exams: Recommended annual ophthalmology exams for early detection of glaucoma and other disorders of the eye Recommended annual dental exams for proper oral hygiene  Diabetic Exams: Diabetic Eye Exam: N/A Diabetic Foot Exam: N/A  Vaccinations: Influenza vaccine: completed 10/17/2018. Due again Fall 2021. Pneumococcal vaccine: completed 11/29/2005 & 09/17/2013. Up to date.  Tdap vaccine:  Completed 01/11/2018; due 01/12/2028. Shingles vaccine: completed 11/07/2017 & 01/11/2018.   Advanced directives: Advance directives discussed with you today.Please bring a copy of your POA (Power of [place]) and/or Living Will to your next appointment.  Goals: Recommend to drink at least 6-8 8oz glasses of water per day.  Recommend to exercise for at least 150 minutes per week.  Recommend to remove any items from the home that may cause slips or trips.  Next appointment: Please schedule your Annual Wellness Visit with your Nurse Health Advisor in one year.  Preventive Care 71 Years and Older, Male Preventive care refers to lifestyle choices and visits with your health care provider that can promote health and wellness. What does preventive care include?  A yearly physical exam. This is also called an annual well check.  Dental exams once or twice a year.  Routine eye exams. Ask your health care provider how often you should have your eyes checked.  Personal lifestyle choices, including:  Daily care of your teeth and gums.  Regular physical activity.  Eating a healthy diet.  Avoiding tobacco  and drug use.  Limiting alcohol use.  Practicing safe sex.  Taking low doses of aspirin every day if recommended by your health care provider..  Taking vitamin and mineral supplements as recommended by your health care provider. What happens during an annual well check? The services and screenings done by your health care provider during your annual well check will depend on your age, overall health, lifestyle risk factors, and family history of disease. Counseling  Your health care provider may ask you questions about your:  Alcohol use.  Tobacco use.  Drug use.  Emotional well-being.  Home and relationship well-being.  Sexual activity.  Eating habits.  History of falls.  Memory and ability to understand (cognition).  Work and work Statistician. Screening  You may have the following tests or measurements:  Height, weight, and BMI.  Blood pressure.  Lipid and cholesterol levels. These may be checked every 5 years, or more frequently if you are over 59 years old.  Skin check.  Lung cancer screening. You may have this screening every year starting at age 50 if you have a 30-pack-year history of smoking and currently smoke or have quit within the past 15 years.  Fecal occult blood test (FOBT) of the stool. You may have this test every year starting at age 56.  Flexible sigmoidoscopy or colonoscopy. You may have a sigmoidoscopy every 5 years or a colonoscopy every 10 years starting at age 67.  Prostate cancer screening. Recommendations will vary depending on your family history and other risks.  Hepatitis C blood test.  Hepatitis B blood test.  Sexually transmitted disease (STD) testing.  Diabetes screening. This is done by checking your blood sugar (  glucose) after you have not eaten for a while (fasting). You may have this done every 1-3 years.  Abdominal aortic aneurysm (AAA) screening. You may need this if you are a current or former smoker.  Osteoporosis.  You may be screened starting at age 74 if you are at high risk. Talk with your health care provider about your test results, treatment options, and if necessary, the need for more tests. Vaccines  Your health care provider may recommend certain vaccines, such as:  Influenza vaccine. This is recommended every year.  Tetanus, diphtheria, and acellular pertussis (Tdap, Td) vaccine. You may need a Td booster every 10 years.  Zoster vaccine. You may need this after age 67.  Pneumococcal 13-valent conjugate (PCV13) vaccine. One dose is recommended after age 109.  Pneumococcal polysaccharide (PPSV23) vaccine. One dose is recommended after age 26. Talk to your health care provider about which screenings and vaccines you need and how often you need them. This information is not intended to replace advice given to you by your health care provider. Make sure you discuss any questions you have with your health care provider. Document Released: 02/21/2015 Document Revised: 10/15/2015 Document Reviewed: 11/26/2014 Elsevier Interactive Patient Education  2017 [place] Prevention in the Home Falls can cause injuries. They can happen to people of all ages. There are many things you can do to make your home safe and to help prevent falls. What can I do on the outside of my home?  Regularly fix the edges of walkways and driveways and fix any cracks.  Remove anything that might make you trip as you walk through a door, such as a raised step or threshold.  Trim any bushes or trees on the path to your home.  Use bright outdoor lighting.  Clear any walking paths of anything that might make someone trip, such as rocks or tools.  Regularly check to see if handrails are loose or broken. Make sure that both sides of any steps have handrails.  Any raised decks and porches should have guardrails on the edges.  Have any leaves, snow, or ice cleared regularly.  Use sand or salt on walking paths  during winter.  Clean up any spills in your garage right away. This includes oil or grease spills. What can I do in the bathroom?  Use night lights.  Install grab bars by the toilet and in the tub and shower. Do not use towel bars as grab bars.  Use non-skid mats or decals in the tub or shower.  If you need to sit down in the shower, use a plastic, non-slip stool.  Keep the floor dry. Clean up any water that spills on the floor as soon as it happens.  Remove soap buildup in the tub or shower regularly.  Attach bath mats securely with double-sided non-slip rug tape.  Do not have throw rugs and other things on the floor that can make you trip. What can I do in the bedroom?  Use night lights.  Make sure that you have a light by your bed that is easy to reach.  Do not use any sheets or blankets that are too big for your bed. They should not hang down onto the floor.  Have a firm chair that has side arms. You can use this for support while you get dressed.  Do not have throw rugs and other things on the floor that can make you trip. What can I do in the kitchen?  Clean up any spills right away.  Avoid walking on wet floors.  Keep items that you use a lot in easy-to-reach places.  If you need to reach something above you, use a strong step stool that has a grab bar.  Keep electrical cords out of the way.  Do not use floor polish or wax that makes floors slippery. If you must use wax, use non-skid floor wax.  Do not have throw rugs and other things on the floor that can make you trip. What can I do with my stairs?  Do not leave any items on the stairs.  Make sure that there are handrails on both sides of the stairs and use them. Fix handrails that are broken or loose. Make sure that handrails are as long as the stairways.  Check any carpeting to make sure that it is firmly attached to the stairs. Fix any carpet that is loose or worn.  Avoid having throw rugs at the top  or bottom of the stairs. If you do have throw rugs, attach them to the floor with carpet tape.  Make sure that you have a light switch at the top of the stairs and the bottom of the stairs. If you do not have them, ask someone to add them for you. What else can I do to help prevent falls?  Wear shoes that:  Do not have high heels.  Have rubber bottoms.  Are comfortable and fit you well.  Are closed at the toe. Do not wear sandals.  If you use a stepladder:  Make sure that it is fully opened. Do not climb a closed stepladder.  Make sure that both sides of the stepladder are locked into place.  Ask someone to hold it for you, if possible.  Clearly mark and make sure that you can see:  Any grab bars or handrails.  First and last steps.  Where the edge of each step is.  Use tools that help you move around (mobility aids) if they are needed. These include:  Canes.  Walkers.  Scooters.  Crutches.  Turn on the lights when you go into a dark area. Replace any light bulbs as soon as they burn out.  Set up your furniture so you have a clear path. Avoid moving your furniture around.  If any of your floors are uneven, fix them.  If there are any pets around you, be aware of where they are.  Review your medicines with your doctor. Some medicines can make you feel dizzy. This can increase your chance of falling. Ask your doctor what other things that you can do to help prevent falls. This information is not intended to replace advice given to you by your health care provider. Make sure you discuss any questions you have with your health care provider. Document Released: 11/21/2008 Document Revised: 07/03/2015 Document Reviewed: 03/01/2014 Elsevier Interactive Patient Education  2017 [employer].

## 2019-04-05 ENCOUNTER — Other Ambulatory Visit (HOSPITAL_COMMUNITY): Payer: Self-pay

## 2019-04-05 MED ORDER — DOFETILIDE 250 MCG PO CAPS: 250.0000 ug | ORAL_CAPSULE | Freq: Two times a day (BID) | ORAL | 3 refills | Status: DC

## 2019-04-09 DIAGNOSIS — C61 Malignant neoplasm of prostate: Secondary | ICD-10-CM | POA: Diagnosis not present

## 2019-04-12 LAB — PSA: PSA: 0.04

## 2019-04-19 ENCOUNTER — Other Ambulatory Visit: Payer: Self-pay | Admitting: Family Medicine

## 2019-04-19 NOTE — Telephone Encounter (Signed)
Last Rx given on 9/2 for #90 with 1 refill

## 2019-04-25 ENCOUNTER — Other Ambulatory Visit (HOSPITAL_COMMUNITY): Payer: Self-pay

## 2019-04-25 MED ORDER — APIXABAN 5 MG PO TABS: 5.0000 mg | ORAL_TABLET | Freq: Two times a day (BID) | ORAL | 3 refills | Status: DC

## 2019-05-21 DIAGNOSIS — C7951 Secondary malignant neoplasm of bone: Secondary | ICD-10-CM | POA: Diagnosis not present

## 2019-05-21 DIAGNOSIS — C61 Malignant neoplasm of prostate: Secondary | ICD-10-CM | POA: Diagnosis not present

## 2019-05-23 ENCOUNTER — Encounter: Payer: Self-pay | Admitting: Family Medicine

## 2019-06-13 ENCOUNTER — Ambulatory Visit (HOSPITAL_COMMUNITY)
Admission: RE | Admit: 2019-06-13 | Discharge: 2019-06-13 | Disposition: A | Discharge status: Home / Self Care | Payer: PPO | Source: Ambulatory Visit | Attending: Cardiology | Admitting: Cardiology

## 2019-06-13 ENCOUNTER — Other Ambulatory Visit: Payer: Self-pay

## 2019-06-13 ENCOUNTER — Encounter (HOSPITAL_COMMUNITY): Payer: Self-pay | Admitting: Cardiology

## 2019-06-13 VITALS — BP 110/60 | HR 77 | Wt 155.6 lb

## 2019-06-13 DIAGNOSIS — J45909 Unspecified asthma, uncomplicated: Secondary | ICD-10-CM | POA: Diagnosis not present

## 2019-06-13 DIAGNOSIS — N189 Chronic kidney disease, unspecified: Secondary | ICD-10-CM | POA: Diagnosis not present

## 2019-06-13 DIAGNOSIS — E785 Hyperlipidemia, unspecified: Secondary | ICD-10-CM | POA: Insufficient documentation

## 2019-06-13 DIAGNOSIS — Z8546 Personal history of malignant neoplasm of prostate: Secondary | ICD-10-CM | POA: Insufficient documentation

## 2019-06-13 DIAGNOSIS — I2510 Atherosclerotic heart disease of native coronary artery without angina pectoris: Secondary | ICD-10-CM | POA: Diagnosis not present

## 2019-06-13 DIAGNOSIS — Z8673 Personal history of transient ischemic attack (TIA), and cerebral infarction without residual deficits: Secondary | ICD-10-CM | POA: Insufficient documentation

## 2019-06-13 DIAGNOSIS — R0602 Shortness of breath: Secondary | ICD-10-CM | POA: Insufficient documentation

## 2019-06-13 DIAGNOSIS — Z79899 Other long term (current) drug therapy: Secondary | ICD-10-CM | POA: Diagnosis not present

## 2019-06-13 DIAGNOSIS — I480 Paroxysmal atrial fibrillation: Secondary | ICD-10-CM | POA: Diagnosis not present

## 2019-06-13 DIAGNOSIS — K219 Gastro-esophageal reflux disease without esophagitis: Secondary | ICD-10-CM | POA: Diagnosis not present

## 2019-06-13 DIAGNOSIS — Z7901 Long term (current) use of anticoagulants: Secondary | ICD-10-CM | POA: Insufficient documentation

## 2019-06-13 DIAGNOSIS — Z955 Presence of coronary angioplasty implant and graft: Secondary | ICD-10-CM | POA: Insufficient documentation

## 2019-06-13 DIAGNOSIS — I130 Hypertensive heart and chronic kidney disease with heart failure and stage 1 through stage 4 chronic kidney disease, or unspecified chronic kidney disease: Secondary | ICD-10-CM | POA: Insufficient documentation

## 2019-06-13 DIAGNOSIS — I5032 Chronic diastolic (congestive) heart failure: Secondary | ICD-10-CM | POA: Diagnosis not present

## 2019-06-13 LAB — BASIC METABOLIC PANEL
Anion gap: 6 (ref 5–15)
BUN: 30 mg/dL — ABNORMAL HIGH (ref 8–23)
CO2: 26 mmol/L (ref 22–32)
Calcium: 9.2 mg/dL (ref 8.9–10.3)
Chloride: 106 mmol/L (ref 98–111)
Creatinine, Ser: 1.27 mg/dL — ABNORMAL HIGH (ref 0.61–1.24)
GFR calc Af Amer: 58 mL/min — ABNORMAL LOW (ref >60)
GFR calc non Af Amer: 50 mL/min — ABNORMAL LOW (ref >60)
Glucose, Bld: 85 mg/dL (ref 70–99)
Potassium: 5 mmol/L (ref 3.5–5.1)
Sodium: 138 mmol/L (ref 135–145)

## 2019-06-13 LAB — TSH: TSH: 2.926 u[IU]/mL (ref 0.350–4.500)

## 2019-06-13 LAB — MAGNESIUM: Magnesium: 2.1 mg/dL (ref 1.7–2.4)

## 2019-06-13 NOTE — Patient Instructions (Signed)
No medication changes!  Labs today We will only contact you if something comes back abnormal or we need to make some changes. Otherwise no news is good news!  Your physician recommends that you schedule a follow-up appointment in: 4 months with Dr Mclean  Please call office at [phone] option 2 if you have any questions or concerns.   At the [facility], you and your health needs are our priority. As part of our continuing mission to provide you with exceptional heart care, we have created designated Provider Care Teams. These Care Teams include your primary Cardiologist (physician) and Advanced Practice Providers (APPs- Physician Assistants and Nurse Practitioners) who all work together to provide you with the care you need, when you need it.   You may see any of the following providers on your designated Care Team at your next follow up: . Dr Daniel Bensimhon . Dr Dalton McLean . Amy Clegg, NP . Brittainy Simmons, PA . Lauren Kemp, PharmD   Please be sure to bring in all your medications bottles to every appointment.     

## 2019-06-13 NOTE — Progress Notes (Signed)
Patient ID: [name] Spence, MD, male   DOB: 05-23-1929, 86 y.o.   MRN: [id number] PCP: Dr. Sherren Mocha Cardiology: Dr. 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  He had an RV infarct with chronic total occlusion of a small nondominant RCA in the 1980s.  He had a drug-eluting stent to the CFX in 11/10. He is very symptomatic with atrial fibrillation episodes. He has had a chronic pattern of mild shortness of breath and mild left shoulder pain/chest aching when walking up a hill (x 20 years).   He can walk 3 miles on flat ground without problems.  He golfs 3-4 times a week.  He walks for 20-30 minutes on a treadmill several times a week.   Echo in 7/16 showed normal LV systolic function, EF 123456 with moderate diastolic dysfunction.  Moderate TR with PA systolic pressure 65 mmHg.  Normal RV size and systolic function.  Given volume overload on exam and abnormal echo, he was started on low dose Lasix in the past.    He was started on Multaq but continued to have episodes of atrial fibrillation.  This was stopped, and he was admitted for Tikosyn loading.  After he went home, he developed left facial droop and left arm weakness.  He was found to have a small right parietal cortical infarct with hemorrhagic conversion.  His symptoms resolved quickly. INR was subtherapeutic at admission.  Warfarin was stopped and he was put on apixaban.   In 9/17, he was admitted with LLL PNA.  During the admission, he had a couple of relatively short runs of atrial fibrillation.  He developed more frequent episodes of atrial fibrillation at home and was started on Tikosyn.   In 5/19, he had bilateral orchiectomy for metastatic prostate cancer.  His PSA has come down considerably.   In 1/20, he had influenza that progressed to PNA.  He was hospitalized with transient atrial fibrillation.    In 11/20, he was admitted with  an E coli UTI and urosepsis with hypotension and AKI.  He was initially fluid-resuscitated and later required diuresis.  He was in atrial fibrillation while in the hospital.   He has been doing well recently.  He is playing 18 holes of golf twice a week.  He is short of breath with hills but no dyspnea walking on flat ground.  This is his chronic pattern.  He has asthma and occasionally wheezes.  He is not using Lasix currently.  He has stopped both diltiazem CD and atenolol due to low BP and lightheadedness.  BP is good today and he has had no further orthostatic symptoms.  He has only had 1 episode since last appointment that seemed like atrial fibrillation to him.   ECG (personally reviewed): NSR, nonspecific T wave flattening, QTc 458 msec   Labs (6/11): LDL 63, HDL 35, K 5.5, creatinine 1.1 Labs (6/12): LDL 58, HDL 53, TSH normal, K 4.7, creatinine 1 Labs (12/12): BNP 79 Labs (7/14): K 5, creatinine 1.1, LDL 48, HDL 34 Labs (8/15): LDL 48, HDL 33, TSH normal, K 5.4, creatinine 1.2 Labs (6/16): K 5.1, creatinine 1.27, HCT 42.5 Labs (10/16): LDL 37, HDL 32 Labs (11/16): K 4.4, creatinine 1.36, BNP 103 Labs  (2/17): K 4.5, creatinine 1.38, Mg 2.0, LDL 32 Labs (3/17): HCT 41.7 Labs (5/17): K 4.2, creatinine 1.17, Mg 2.1 Labs (9/17): K 4.2, creatinine 1.16, HCT  37.3 Labs (11/17): hgb 13.3, LDL 40 Labs (5/18): K 4.5, creatinine 1.25 Labs (5/19): K 5.5, creatinine 1.16 Labs (2/20): K 4, creatinine 1.24, hgb 12.2 Labs (11/20): K 3.4, creatinine 1.34, hgb 10.3, LDL 49 Labs (12/20): K 4.5, creatinine 1.2 Labs (2/21): K 3.9, creatinine 1.5, TSH 4.5 (mildly elevated)  PMH: 1. CAD: RV infarct in the 1980s.  Chronic total occlusion of a small nondominant RCA.  11/10 PCI to CFX with Xience DES.  EF 60% on LV-gram at that time. ETT (11/14) with 9'07" exercise, no significant ST depression.  2.  Paroxysmal atrial fibrillation: Multaq was ineffective. Now on Tikosyn.  3.  Hyperlipidemia 4. GERD 5.  HTN 6. Asthma 7. Chronic diastolic CHF: Echo ([id number]): EF 55-60%, mild LV hypertrophy, no regional WMAs, mild AI, mild MR, mild-moderate biatrial enlargement, PA systolic pressure 51 mmHg. Echo (7/16) with EF 60-65%, moderate diastolic dysfunction, normal RV size and systolic function, mild-moderate MR, moderate TR, moderate biatrial enlargement, PA systolic pressure 65 mmHg.  - Echo (5/18): EF 55-60%, mild AI, mild MR, PASP 60 mmHg, normal RV size and systolic function.  - Echo (8/19): EF 60-65%, normal RV size and systolic function, severe TR with PASP 60 mmHg.  8. CKD 9. CVA: Right parietal cortical CVA with peri-infarct hemorrhagic conversion.  CTA showed right M2 branch flow gap.   10. Prostate cancer: Metastatic.  - Bilateral orchiectomy in 5/19.   SH: Nonsmoker.  Retired Stage manager, lives in [place].  2 daughters.  Originally from [place].   FH: Brother with CABG.   ROS: All systems reviewed and negative except as per HPI.    Current Outpatient Medications  Medication Sig Dispense Refill  . apixaban (ELIQUIS) 5 MG TABS tablet Take 1 tablet (5 mg total) by mouth 2 (two) times daily. 180 tablet 3  . atorvastatin (LIPITOR) 20 MG tablet TAKE 1 TABLET(20 MG) BY MOUTH DAILY 90 tablet 3  . buPROPion (WELLBUTRIN SR) 150 MG 12 hr tablet Take 1 tablet (150 mg total) by mouth daily. 90 tablet 3  . cetirizine (ZYRTEC) 10 MG tablet Take 10 mg by mouth daily with supper.     . Cholecalciferol (VITAMIN D) 2000 units tablet Take 2,000 Units by mouth daily.    Marland Kitchen diltiazem (CARDIZEM CD) 120 MG 24 hr capsule Take 120 mg by mouth daily as needed.    . dofetilide (TIKOSYN) 250 MCG capsule Take 1 capsule (250 mcg total) by mouth 2 (two) times daily. 180 capsule 3  . fluticasone (FLONASE) 50 MCG/ACT nasal spray Place 2 sprays into both nostrils daily. 48 g 3  . Fluticasone Furoate (ARNUITY ELLIPTA) 200 MCG/ACT AEPB Inhale 1 puff into the lungs daily. 2 each 0  . LORazepam (ATIVAN) 1 MG tablet TAKE  1/2 TO 1 TABLET BY MOUTH AT BEDTIME AS NEEDED 90 tablet 1  . nitroGLYCERIN (NITROSTAT) 0.4 MG SL tablet Place 1 tablet (0.4 mg total) under the tongue every 5 (five) minutes x 3 doses as needed for chest pain. 25 tablet 2  . omeprazole (PRILOSEC OTC) 20 MG tablet Take 1 tablet (20 mg total) by mouth daily. 100 tablet 4  . tamsulosin (FLOMAX) 0.4 MG CAPS capsule Take 1 capsule (0.4 mg total) by mouth daily. 90 capsule 3  . traMADol (ULTRAM) 50 MG tablet Take 1 tablet (50 mg total) by mouth every 12 (twelve) hours as needed. 120 tablet 5   No current facility-administered medications for this encounter.    BP 110/60   Pulse 77  Wt 70.6 kg (155 lb 9.6 oz)   SpO2 98%   BMI 22.65 kg/m  General: NAD Neck: No JVD, no thyromegaly or thyroid nodule.  Lungs: Mild end expiratory wheezes CV: Nondisplaced PMI.  Heart regular S1/S2, no S3/S4, 1/6 HSM LLSB.  No peripheral edema.  No carotid bruit.  Normal pedal pulses.  Abdomen: Soft, nontender, no hepatosplenomegaly, no distention.  Skin: Intact without lesions or rashes.  Neurologic: Alert and oriented x 3.  Psych: Normal affect. Extremities: No clubbing or cyanosis.  HEENT: Normal.   Assessment/Plan: 1. CAD: No chest pain.    - He is on apixaban without ASA given stable CAD.   - Continue statin 2. Hyperlipidemia: LDL was at goal (49) in 11/20.  3. Chronic diastolic CHF:  Most recent echo in 8/19 showed EF 123456 but PA systolic pressure was estimated at 60 mmHg and there was severe TR (progressed from moderate), the RV looked normal.  I suspect that he has a degree of RV failure accounting for NYHA class II symptoms.  He is not taking Lasix now and weight is stable.  He is not volume overloaded on exam.  - He wants to hold off on repeat echo.  Given age, think this is reasonable as unlikely to change management much.   - He can stay off Lasix for now.  4. Atrial fibrillation: Paroxysmal.  Episodes are quite symptomatic and usually triggered by  some form of physical stress (infection typically), he feels "bad" with atrial fibrillation even when he's rate-controlled. He had frequent breakthrough on Multaq but and is now on Tikosyn.  Only 1 episode of symptomatic atrial fibrillation since last visit.  QTc ok on today's echo.  He is now off diltiazem CD and atenolol due to low BP and orthostasis.   - Continue Tikosyn.  BMET/Mg today.  - If he develops frequent breakthrough afib on Tikosyn, amiodarone would be an option.  - Now on apixaban with CVA on warfarin (was subtherapeutic).  5. HTN: BP controlled.    Followup in 4 months.     Loralie Champagne 06/13/2019

## 2019-07-11 ENCOUNTER — Other Ambulatory Visit: Payer: Self-pay | Admitting: Family Medicine

## 2019-07-11 NOTE — Telephone Encounter (Signed)
Please advise last ov was 01/28/19

## 2019-08-15 ENCOUNTER — Telehealth (INDEPENDENT_AMBULATORY_CARE_PROVIDER_SITE_OTHER): Payer: PPO | Admitting: Family Medicine

## 2019-08-15 DIAGNOSIS — J069 Acute upper respiratory infection, unspecified: Secondary | ICD-10-CM

## 2019-08-15 DIAGNOSIS — R062 Wheezing: Secondary | ICD-10-CM

## 2019-08-15 MED ORDER — PREDNISONE 10 MG PO TABS: ORAL_TABLET | ORAL | 0 refills | Status: DC

## 2019-08-15 MED ORDER — CEFUROXIME AXETIL 250 MG PO TABS: 250.0000 mg | ORAL_TABLET | Freq: Two times a day (BID) | ORAL | 0 refills | Status: DC

## 2019-08-15 NOTE — Progress Notes (Signed)
Patient ID: [name] Spence, MD, male   DOB: [birth year], 86 y.o.   MRN: [id number]  This visit type was conducted due to national recommendations for restrictions regarding the COVID-19 pandemic in an effort to limit this patient's exposure and mitigate transmission in our community.   Virtual Visit via Video Note  I connected with [name] [name] on 08/15/19 at 10:00 AM EDT by a video enabled telemedicine application and verified that I am speaking with the correct person using two identifiers.  Location patient: home Location provider:work or home office Persons participating in the virtual visit: patient, provider  I discussed the limitations of evaluation and management by telemedicine and the availability of in person appointments. The patient expressed understanding and agreed to proceed.   HPI: [name] [name] called with about 6-day history of upper respiratory symptoms.  He states he was around a 57-1/2-year-old grandson down at [employer] last week.  The day after they left their house his grandson started having cold-like symptoms.  He developed last Thursday some sore throat followed by some nasal congestion and now cough.  Occasionally productive cough with yellow sputum.  No fever.  He does feel like he has some wheezing at this point over the past few days.  He has had previous Covid vaccination which was Pfizer back on the eighth and 28 February.  He has had some reactive airway issues in the past.  He tries to avoid albuterol because of his history of A. fib.  His current rate is around 80.  He remains on Eliquis.  Takes Cardizem for rate control.  He denies any dyspnea at rest.  No nausea or vomiting.  No confusion.   ROS: See pertinent positives and negatives per HPI.  Past Medical History:  Diagnosis Date  . Allergic rhinitis   . Anxiety   . Asthma   . Atrial fibrillation ([place])   . CAD (coronary artery disease)   . Depression   . Diverticulosis 03/16/1995  . DJD (degenerative joint disease)     "most joints" (03/11/2015)  . GERD (gastroesophageal reflux disease) 12/30/2000  . Heart attack ([place]) 1984  . Hiatal hernia 12/30/2000  . History of blood transfusion 1988   "related to GI bleeding OR"  . History of duodenal ulcer 10/15/1986  . Hyperlipidemia   . Hypertension   . Sepsis ([place]) 12/2018  . Sinus bradycardia   . Stroke [facility])     Past Surgical History:  Procedure Laterality Date  . CARDIAC CATHETERIZATION    . CATARACT EXTRACTION W/ INTRAOCULAR LENS  IMPLANT, BILATERAL Bilateral ?2013  . CHOLECYSTECTOMY OPEN  1989  . CORONARY ANGIOPLASTY WITH STENT PLACEMENT  2010  . hospital ccu  1984   heart attack, hypoplastic right coronary  . INGUINAL HERNIA REPAIR Right ~ 1990  . NASAL SEPTUM SURGERY  1972  . ORCHIECTOMY Bilateral 06/10/2017   Procedure: BILATERAL ORCHIECTOMY;  Surgeon: Franchot Gallo, MD;  Location: [facility];  Service: Urology;  Laterality: Bilateral;  MAC ANESTHESIA AND LOCAL  . TONSILLECTOMY    . TOTAL KNEE ARTHROPLASTY Bilateral 1998-2004   "right-left"  . VAGOTOMY AND PYLOROPLASTY  1988   "bleeding duodenal ulcer"    Family History  Problem Relation Age of Onset  . Coronary artery disease Mother        deceased  . Deep vein thrombosis Sister   . Coronary artery disease Brother   . Asthma Brother   . Colon cancer Neg Hx     SOCIAL HX: Non-smoker  Current Outpatient Medications:  .  apixaban (ELIQUIS) 5 MG TABS tablet, Take 1 tablet (5 mg total) by mouth 2 (two) times daily., Disp: 180 tablet, Rfl: 3 .  atorvastatin (LIPITOR) 20 MG tablet, TAKE 1 TABLET(20 MG) BY MOUTH DAILY, Disp: 90 tablet, Rfl: 3 .  buPROPion (WELLBUTRIN SR) 150 MG 12 hr tablet, Take 1 tablet (150 mg total) by mouth daily., Disp: 90 tablet, Rfl: 3 .  cetirizine (ZYRTEC) 10 MG tablet, Take 10 mg by mouth daily with supper. , Disp: , Rfl:  .  Cholecalciferol (VITAMIN D) 2000 units tablet, Take 2,000 Units by mouth daily., Disp: , Rfl:  .  diltiazem (CARDIZEM CD) 120 MG 24 hr  capsule, Take 120 mg by mouth daily as needed., Disp: , Rfl:  .  dofetilide (TIKOSYN) 250 MCG capsule, Take 1 capsule (250 mcg total) by mouth 2 (two) times daily., Disp: 180 capsule, Rfl: 3 .  fluticasone (FLONASE) 50 MCG/ACT nasal spray, Place 2 sprays into both nostrils daily., Disp: 48 g, Rfl: 3 .  Fluticasone Furoate (ARNUITY ELLIPTA) 200 MCG/ACT AEPB, Inhale 1 puff into the lungs daily., Disp: 2 each, Rfl: 0 .  LORazepam (ATIVAN) 1 MG tablet, TAKE 1/2 TO 1 TABLET BY MOUTH AT BEDTIME AS NEEDED, Disp: 90 tablet, Rfl: 1 .  nitroGLYCERIN (NITROSTAT) 0.4 MG SL tablet, Place 1 tablet (0.4 mg total) under the tongue every 5 (five) minutes x 3 doses as needed for chest pain., Disp: 25 tablet, Rfl: 2 .  omeprazole (PRILOSEC OTC) 20 MG tablet, Take 1 tablet (20 mg total) by mouth daily., Disp: 100 tablet, Rfl: 4 .  tamsulosin (FLOMAX) 0.4 MG CAPS capsule, Take 1 capsule (0.4 mg total) by mouth daily., Disp: 90 capsule, Rfl: 3 .  traMADol (ULTRAM) 50 MG tablet, TAKE 1 TABLET(50 MG) BY MOUTH EVERY 12 HOURS AS NEEDED, Disp: 120 tablet, Rfl: 0  EXAM:  VITALS per patient if applicable:  GENERAL: alert, oriented, appears well and in no acute distress  HEENT: atraumatic, conjunttiva clear, no obvious abnormalities on inspection of external nose and ears  NECK: normal movements of the head and neck  LUNGS: on inspection no signs of respiratory distress, breathing rate appears normal, no obvious gross SOB, gasping or wheezing  CV: no obvious cyanosis  MS: moves all visible extremities without noticeable abnormality  PSYCH/NEURO: pleasant and cooperative, no obvious depression or anxiety, speech and thought processing grossly intact  ASSESSMENT AND PLAN:  Discussed the following assessment and plan:  Upper respiratory symptoms over the past week with progressive productive cough.  No respiratory distress.  Probable initial onset with viral URI.  High risk for complication.  Has some reactive airway  issues by description-with some wheezing and mild dyspnea with activity.  -Prednisone taper over the next week -Start Ceftin 250 mg twice daily for 7 days -Follow-up promptly for any fever or worsening symptoms -Plenty of fluids and rest -He has home pulse oximeter and we have asked that he monitor O2 sats and be in touch if low 90s range or below     I discussed the assessment and treatment plan with the patient. The patient was provided an opportunity to ask questions and all were answered. The patient agreed with the plan and demonstrated an understanding of the instructions.   The patient was advised to call back or seek an in-person evaluation if the symptoms worsen or if the condition fails to improve as anticipated.     Carolann Littler, MD

## 2019-08-16 MED ORDER — ALBUTEROL SULFATE HFA 108 (90 BASE) MCG/ACT IN AERS: 2.0000 | INHALATION_SPRAY | RESPIRATORY_TRACT | 1 refills | Status: DC | PRN

## 2019-08-16 NOTE — Addendum Note (Signed)
Addended by: [name] on: 08/16/2019 09:15 AM   Modules accepted: Orders

## 2019-08-27 ENCOUNTER — Telehealth: Payer: Self-pay | Admitting: Internal Medicine

## 2019-08-27 NOTE — Telephone Encounter (Signed)
No NP availability in the next 2 days.  MR had an opening on 7/21 at 9:45, pt scheduled to see MR then.    Routing to MR as FYI at his request.

## 2019-08-27 NOTE — Telephone Encounter (Signed)
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 Dr Coralie Keens called me an hour ago saying that patient has not gotten better after getting treated with antibiotics plus minus prednisone by primary care physician.  He still has cough and yellow sputum.  Plan -Can you be seen by one of our nurse practitioners in the next 48 hours preferably in the afternoon on Tuesday, September 05, 2019 or morning Wednesday, August 29, 2019.  If that is not suitable then or anytime there is a slot outpatient available but in the next 24-48 hours.  -Please let me know who is going to see the patient so I can have a discussion with the nurse practitioner ahead of time.

## 2019-08-29 ENCOUNTER — Ambulatory Visit: Payer: PPO | Admitting: Internal Medicine

## 2019-08-29 ENCOUNTER — Other Ambulatory Visit: Payer: Self-pay

## 2019-08-29 ENCOUNTER — Encounter: Payer: Self-pay | Admitting: Internal Medicine

## 2019-08-29 VITALS — BP 140/70 | HR 78 | Temp 97.8°F | Ht 69.5 in | Wt 157.2 lb

## 2019-08-29 DIAGNOSIS — J4530 Mild persistent asthma, uncomplicated: Secondary | ICD-10-CM | POA: Diagnosis not present

## 2019-08-29 MED ORDER — ARNUITY ELLIPTA 200 MCG/ACT IN AEPB: 1.0000 | INHALATION_SPRAY | Freq: Every day | RESPIRATORY_TRACT | 0 refills | Status: DC

## 2019-08-29 MED ORDER — ARNUITY ELLIPTA 200 MCG/ACT IN AEPB: 1.0000 | INHALATION_SPRAY | Freq: Every day | RESPIRATORY_TRACT | 5 refills | Status: DC

## 2019-08-29 MED ORDER — ALBUTEROL SULFATE HFA 108 (90 BASE) MCG/ACT IN AERS: 2.0000 | INHALATION_SPRAY | RESPIRATORY_TRACT | 2 refills | Status: DC | PRN

## 2019-08-29 NOTE — Progress Notes (Signed)
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 Followed in past for asthma w/ bronchitis. LOV around 2008. Medical hx CAD/ MI/CABG, AFib/ warfarin, diastolic dysfunction, GERD He did well for several years with help from Dr Sherren Mocha. Starting about 6 months ago with no obvious trigger, he began having increased wheeze, cough, needing SABA 2-3x/ day and Qvar only intermittently. He has been worse this Spring, blaming pollen. Sputum remains clear. Denies fever, nodes, blood, edema, chest pain. Hx  Intermittent AFib- no pacemaker Had BCG in medical school.         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 Followed in past for asthma w/ bronchitis.  Medical hx CAD/ MI/CABG,P AFib/ warfarin, diastolic dysfunction, GERD. Dr Coralie Keens alerted [place] that pt was having more trouble, so we could work him in today. Follows For: Pt c/o wheezing and SOB, prod cough with thick yellow mucus. States he had asthma flare up last night. Has has had 3 episodes of A-fib in last week, most recent was 06/18/14.  Persistent variable chest rattle has been going on almost since he was last seen here. Using albuterol rescue inhaler once or twice daily. He has caught a cold from his wife and overnight began or persistent wheeze, using rescue inhaler 4 times daily and continuing his Qvar. Transient relief only. Sputum trace yellow. No fever. He has a little left over prednisone but reports that 20 mg of prednisone daily is enough of a stimulant to trigger his atrial fib.  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  Medical hx CAD/ MI/CABG,P AFib/ warfarin, diastolic dysfunction, GERD Follow For: Pt doing well since last visit. Still post nasal drip. Denies wheezing, SOB, Prod cough.  Has not needed  rescue inhaler in months and denies wheeze since a cold in early spring. Using Qvar twice daily and Singulair. Notices some hoarseness, mouth breathing  Told he has some pulmonary hypertension. We discussed possible role of oxygen desaturation. Does not recognize symptoms of sleep apnea. CXR 06/24/14 IMPRESSION: No active cardiopulmonary disease. Electronically Signed  By: Franchot Gallo M.D.  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 Medical history CAD/MI/CABG, P A. fib/warfarin, diastolic dysfunction, GERD FOLLOWS FOR: Pt states he has had very little SOB or wheezing since last OV. Has had trouble with Afib. He has continued Flovent 110 and pro air rescue inhaler twice daily each. Has not recognized any association of atrial fib or palpitations with use of albuterol. [name] last year with no particular benefit recognized over his current regimen. He asks about persistent hoarseness. Overnight Oximetry 09/30/2014-normal, not qualifying for sleep O2   OV 86 year old male  Chief Complaint  Patient presents with  . Hospitalization Follow-up    Pt recently hospitalized for LLL CAP. Pt c/o prod cough with green, blood streaked mucus.  Pt discharged yesterday.     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 In June 2017 he had worsening asthma symptoms and he saw Dr. Hardie Pulley and allergy. According to his history exhaled nitric oxide at this visit was elevated at 70 ppb. He was then started on inhaled corticosteroid ARNUITY . Marland Kitchen Up until that point he was only taking her steroids as needed. After that he started taking her steroids regular basis. At follow-up his   He maintain himself on the inhaled steroid. Apparently blood allergy  panel was negative. He was then leading his baseline life with control of atrial fibrillation. However on 10/19/2015 he got hospitalized for left lower lobe pneumonia. Personally visualized the chest  x-ray review the records and agree with the findings. It sounds like he had bacterial pneumonia not otherwise specified. He was discharged yesterday. He still on Augmentin. He has postnasal drainage but is not taking any nasal steroids. He has nocturnal acid reflux and is on fish oil. He is feeling back to baseline other than some post pneumonia fatigue. He also feels his atrial fibrillation is somewhat out of control. He has some occasional streaky hemoptysis but this is improving.   OV 12/03/2015  Chief Complaint  Patient presents with  . Follow-up    Pt states his SOB is at baseline - doing well. Pt denies cough, CP/tightness, and f/c/s.     Follow-up left lower lobe pneumonia.  He was hospitalized 10/19/2015 for left lower lobe pneumonia. This is clinical follow-up for the same. He no longer has hemoptysis since his last office visit mid September 2017. He continues to feel well. He is asymptomatic without any shortness of breath wheezing or chest tightness or cough. Only she is was nasal drip that bothers him. He tells me that a year ago he had sinus scan and it showed significant sinus congestion. He is followed up with ENT in the past but is not in a while. Inhaled steroids to the nose helping him but not fully controlled this. He says he will follow-up with ENT for the same. But at this point overall he is feeling fine and playing golf. He is compliant with his inhaled corticosteroid ARNUITY. He will need a chest x-ray today. He is up-to-date with his flu shot.   In interim he did see Dr Aundra Dubin for  Afib and notes reviewed   [name] 06/07/2016  Chief Complaint  Patient presents with  . Follow-up    Pt states his breathing has been doing well. Pt c/o prod cough with clear mucus in morning - baseline for pt. Pt denies CP/tightness and f/c/s.    Follow-up mild persistent asthma on inhaled corticosteroid ARNUITY: He is currently feeling well. Symptoms are extremely well controlled. He visited  his allergist Dr. Orvil Feil in March 2018 and his nitric oxide was 17 according to his history. There no new issues. He wants samples of his inhaled steroid because he is in the donut hole  Follow-up allergic rhinitis: This continues to bother him. It is not any worse. It is stable and mild. He is on inhaled nasal steroid and inhaled antihistamine. Despite nasal drainage and he coughs early in the.  morning . He is willing to add on nasal saline  Follow-up pneumonia: He had this last year: Follow-up chest x-ray February 2018 showed resolution. We recommended a CT scan chest for follow-up but he opted to have a chest x-ray in instead    COMPARISON:  Chest CT 01/05/2016.  Chest radiographs 12/03/2015. IMPRESSION: 1. Previous bilateral bronchopneumonia appears resolved. No acute cardiopulmonary abnormality. 2.  Calcified aortic atherosclerosis.   Electronically Signed   By: Genevie Ann M.D.   On: 04/02/2016 12:01    OV 12/13/2016  Chief Complaint  Patient presents with  . Follow-up    Pt states that he has been doing good; states he has not had any flare-ups with his asthma. Denies an cough, SOB, or CP.    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 Last seen 6 months ago. He continues to do well asthma control questionnaire is 0 out of 5. He does not have any nocturnal symptoms. When he wakes up he does not have any symptoms is not limited in his activities because of asthma. Is not having shortness of breath or wheezing or nocturnal symptoms no albuterol rescue use. He is up-to-date with his flu shot he continues to play golf. He is okay spacing out his follow-ups for every 9 months   OV 09/28/2018  Subjective:  Patient ID: [name] Spence, [name] [name], male , DOB: 1929-06-14 , age 41 y.o. , MRN: [id number] , ADDRESS: [place] [address]   09/28/2018 -   Chief Complaint    Patient presents with  . Follow-up     HPI [name] Spence, [name] [name] 86 y.o. - mild persistsent asthmatic on arnuity. Last seen nov 2018. Then called for refill and asked to come in because has been 2 year. This is first [name] [name] visit since onsent of pandemic. Says in jan 2020 had LLL pneumonia and A fib (confirmed on personal visualization of cxr). AFter that doing well. Playing golf. Has chronic post nasal drip - needing oral antihistamine, flonase and astelazine spray but still mild and peristent. ASthma well controlle.d ACQ is 0.2 of 5. Not waking up at night. When waking up barely any symptoms. Not limited in activities. No dyspnea. Just occ baseline wheee (has on exam too). No alb rescue use      OV 08/29/2019  Subjective:  Patient ID: [name] Spence, [name] [name], male , DOB: 10-11-1929 , age 29 y.o. , MRN: [id number] , ADDRESS: [place] [address] PCP [name], [name] [name]   08/29/2019 -   Chief Complaint  Patient presents with  . Follow-up    productive cough with clear mucus mostly very little yellow color     HPI [name] Spence, [name] [name] 86 y.o. -presents for asthma follow-up.  Last seen August 2020.  A month ago he played golf and was fine.  Then on August 15, 2019 he had a sick contact with his grandson.  He then had respiratory illness.  He did a video visit with primary care physician on August 15, 2019.  Ceftin was prescribed.  Also Medrol Dosepak was prescribed according to his history.  After that he is better but still has residual cough therefore he made this appointment.  From the time he made this appointment to today the cough is continued to improve.  His sputum that was yellow is now clear.  Symptoms are only mild.  He was planning to play golf yesterday but because of his atrial fibrillation he did not.  He plans to play golf tomorrow.  He is not interested in much testing or extending steroids or antibiotics unless if clinically indicated.  No current orthopnea.  He continues his  Arnuity without fail.     ROS - per HPI     has a past medical history of Allergic rhinitis, Anxiety, Asthma, Atrial fibrillation ([place]), CAD (coronary artery disease), Depression, Diverticulosis (03/16/1995), DJD (degenerative joint disease), GERD (gastroesophageal reflux disease) (12/30/2000), Heart attack ([place]) (1984), Hiatal hernia (12/30/2000), History of blood transfusion (1988), History of duodenal ulcer (10/15/1986), Hyperlipidemia, Hypertension, Sepsis ([place]) (12/2018), Sinus bradycardia, and Stroke ([place]).   reports that he has never smoked. He has never used smokeless tobacco.  Past Surgical History:  Procedure Laterality Date  . CARDIAC CATHETERIZATION    .  CATARACT EXTRACTION W/ INTRAOCULAR LENS  IMPLANT, BILATERAL Bilateral ?2013  . CHOLECYSTECTOMY OPEN  1989  . CORONARY ANGIOPLASTY WITH STENT PLACEMENT  2010  . hospital ccu  1984   heart attack, hypoplastic right coronary  . INGUINAL HERNIA REPAIR Right ~ 1990  . NASAL SEPTUM SURGERY  1972  . ORCHIECTOMY Bilateral 06/10/2017   Procedure: BILATERAL ORCHIECTOMY;  Surgeon: Franchot Gallo, [name] [name];  Location: [facility];  Service: Urology;  Laterality: Bilateral;  MAC ANESTHESIA AND LOCAL  . TONSILLECTOMY    . TOTAL KNEE ARTHROPLASTY Bilateral 1998-2004   "right-left"  . VAGOTOMY AND PYLOROPLASTY  1988   "bleeding duodenal ulcer"    Allergies  Allergen Reactions  . Tylenol [Acetaminophen] Rash  . Azithromycin   . Advil [Ibuprofen] Nausea Only and Rash  . Asa [Aspirin] Rash    Immunization History  Administered Date(s) Administered  . Fluad Quad(high Dose 65+) 10/17/2018  . Influenza Split 10/23/2012, 10/02/2015  . Influenza Whole 10/27/2007, 11/13/2008, 10/29/2009, 11/09/2010  . Influenza, High Dose Seasonal PF 10/28/2016  . Influenza,inj,Quad PF,6+ Mos 10/19/2017  . Influenza-Unspecified 12/09/2013, 10/28/2014, 10/17/2018  . PFIZER SARS-COV-2 Vaccination 03/19/2019, 04/08/2019  . Pneumococcal Conjugate-13 09/17/2013    . Pneumococcal Polysaccharide-23 08/30/2000, 11/29/2005  . Td 06/20/2007, 01/11/2018  . Tdap 01/11/2018  . Zoster 06/20/2007  . Zoster Recombinat (Shingrix) 11/07/2017, 11/22/2017, 01/11/2018    Family History  Problem Relation Age of Onset  . Coronary artery disease Mother        deceased  . Deep vein thrombosis Sister   . Coronary artery disease Brother   . Asthma Brother   . Colon cancer Neg Hx      Current Outpatient Medications:  .  albuterol (VENTOLIN HFA) 108 (90 Base) MCG/ACT inhaler, Inhale 2 puffs into the lungs every 4 (four) hours as needed for wheezing or shortness of breath., Disp: 18 g, Rfl: 1 .  apixaban (ELIQUIS) 5 MG TABS tablet, Take 1 tablet (5 mg total) by mouth 2 (two) times daily., Disp: 180 tablet, Rfl: 3 .  atorvastatin (LIPITOR) 20 MG tablet, TAKE 1 TABLET(20 MG) BY MOUTH DAILY, Disp: 90 tablet, Rfl: 3 .  buPROPion (WELLBUTRIN SR) 150 MG 12 hr tablet, Take 1 tablet (150 mg total) by mouth daily., Disp: 90 tablet, Rfl: 3 .  cetirizine (ZYRTEC) 10 MG tablet, Take 10 mg by mouth daily with supper. , Disp: , Rfl:  .  Cholecalciferol (VITAMIN D) 2000 units tablet, Take 2,000 Units by mouth daily., Disp: , Rfl:  .  diltiazem (CARDIZEM CD) 120 MG 24 hr capsule, Take 120 mg by mouth daily as needed., Disp: , Rfl:  .  dofetilide (TIKOSYN) 250 MCG capsule, Take 1 capsule (250 mcg total) by mouth 2 (two) times daily., Disp: 180 capsule, Rfl: 3 .  fluticasone (FLONASE) 50 MCG/ACT nasal spray, Place 2 sprays into both nostrils daily., Disp: 48 g, Rfl: 3 .  Fluticasone Furoate (ARNUITY ELLIPTA) 200 MCG/ACT AEPB, Inhale 1 puff into the lungs daily., Disp: 2 each, Rfl: 0 .  LORazepam (ATIVAN) 1 MG tablet, TAKE 1/2 TO 1 TABLET BY MOUTH AT BEDTIME AS NEEDED, Disp: 90 tablet, Rfl: 1 .  nitroGLYCERIN (NITROSTAT) 0.4 MG SL tablet, Place 1 tablet (0.4 mg total) under the tongue every 5 (five) minutes x 3 doses as needed for chest pain., Disp: 25 tablet, Rfl: 2 .  omeprazole  (PRILOSEC OTC) 20 MG tablet, Take 1 tablet (20 mg total) by mouth daily., Disp: 100 tablet, Rfl: 4 .  tamsulosin (FLOMAX) 0.4  MG CAPS capsule, Take 1 capsule (0.4 mg total) by mouth daily., Disp: 90 capsule, Rfl: 3 .  traMADol (ULTRAM) 50 MG tablet, TAKE 1 TABLET(50 MG) BY MOUTH EVERY 12 HOURS AS NEEDED, Disp: 120 tablet, Rfl: 0      Objective:   Vitals:   08/29/19 0939  BP: 140/70  Pulse: 78  Temp: 97.8 F (36.6 C)  TempSrc: Oral  SpO2: 98%  Weight: 157 lb 3.2 oz (71.3 kg)  Height: 5' 9.5" (1.765 m)    Estimated body mass index is 22.88 kg/m as calculated from the following:   Height as of this encounter: 5' 9.5" (1.765 m).   Weight as of this encounter: 157 lb 3.2 oz (71.3 kg).  _0 @  [facility] Weights   08/29/19 0939  Weight: 157 lb 3.2 oz (71.3 kg)     Physical Exam Has hearing aids.  Pleasant frail but alert and oriented.  Cheerful.  Has abdominal scar from previous gastric bleeding surgery.  Clear to auscultation bilaterally normal heart sounds.  No neck nodes no elevated JVP no oral thrush no postnasal drip.        Assessment:       ICD-10-CM   1. Mild persistent asthma without complication  G38.75        Plan:     Patient Instructions     ICD-10-CM   1. Mild persistent asthma without complication  I43.32     Asthma flareup appears to be resolving  Plan -We took shared decision making to continue to watch  -Hold off on chest x-ray repeat prednisone antibiotics at this point -Continue Arnuity as before  -Take 2 samples if we have it -Use albuterol as needed -Continue to mask and social distance because of the rising delta variant of COVID-19 even in the vaccinated  Follow-up -6-9 months or sooner if needed -Call us or return sooner if needed     SIGNATURE    Dr. Brand Males, M.D., F.C.C.P,  Pulmonary and Critical Care Medicine Staff Physician, [facility] Director - Interstitial Lung Disease  Program    Pulmonary [place] at [place], [place], [address]  Pager: [phone], If no answer or between  15:00h - 7:00h: call [phone] Telephone: [phone]  9:59 AM 08/29/2019

## 2019-08-29 NOTE — Patient Instructions (Signed)
ICD-10-CM   1. Mild persistent asthma without complication  F29.02     Asthma flareup appears to be resolving  Plan -We took shared decision making to continue to watch  -Hold off on chest x-ray repeat prednisone antibiotics at this point -Continue Arnuity as before  -Take 2 samples if we have it -Use albuterol as needed -Continue to mask and social distance because of the rising delta variant of COVID-19 even in the vaccinated  Follow-up -6-9 months or sooner if needed -Call us or return sooner if needed

## 2019-09-18 ENCOUNTER — Other Ambulatory Visit: Payer: Self-pay | Admitting: Internal Medicine

## 2019-09-19 ENCOUNTER — Other Ambulatory Visit: Payer: Self-pay | Admitting: Internal Medicine

## 2019-09-26 DIAGNOSIS — R351 Nocturia: Secondary | ICD-10-CM | POA: Diagnosis not present

## 2019-09-26 DIAGNOSIS — C61 Malignant neoplasm of prostate: Secondary | ICD-10-CM | POA: Diagnosis not present

## 2019-09-26 DIAGNOSIS — N401 Enlarged prostate with lower urinary tract symptoms: Secondary | ICD-10-CM | POA: Diagnosis not present

## 2019-09-29 IMAGING — NM NM BONE WHOLE BODY
2 series · 2 of 2 positions shown · non-contrast
Comparison: CT abdomen and pelvis including bony reformats [place]

CLINICAL DATA: History of prostate carcinoma

EXAM:
NUCLEAR MEDICINE WHOLE BODY BONE SCAN
TECHNIQUE: Whole body anterior and posterior images were obtained approximately
3 hours after intravenous injection of radiopharmaceutical.
RADIOPHARMACEUTICALS:  20.6 mCi Gechnetium-NNm MDP IV

[Series 1: whole body · 2.66mm/px · 1 of 1 slices shown (1 of 2)]
[im 1/1]
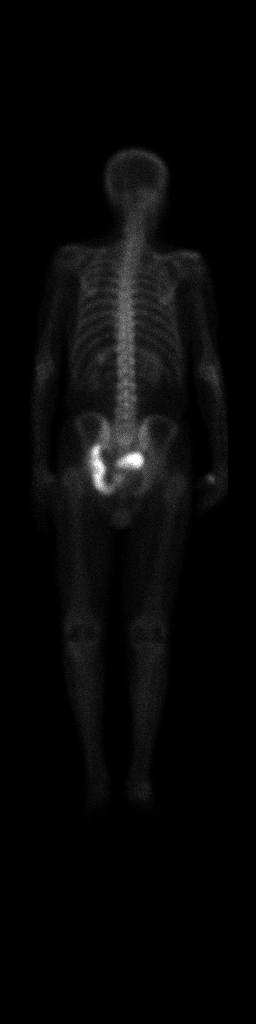

[Series 1: whole body · 2.66mm/px · 1 of 1 slices shown (2 of 2)]
[im 1/1]
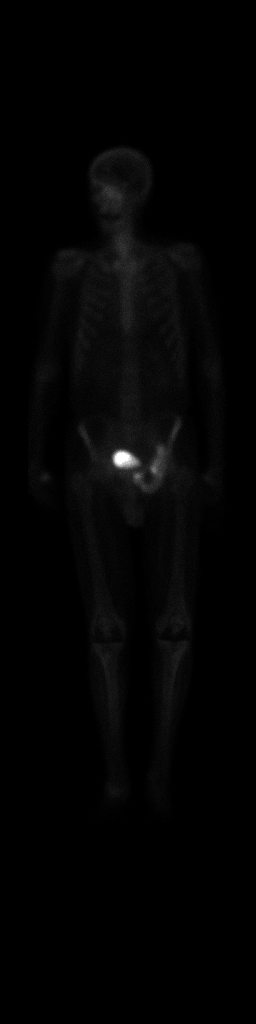

[2 of 2 positions shown; findings below may reference images not displayed]

FINDINGS: There is extensive abnormal radiotracer uptake throughout the left
inferior iliac crest, left acetabulum, and left ischium consistent
with the sclerotic lesions in these areas seen on recent CT. This
appearance is consistent with extensive metastatic disease in these
areas. There is an area of increased uptake in the posterior left
twelfth rib, concerning for a neoplastic focus. There is no abnormal
uptake seen in the L2 vertebral body in the area of a small probable
metastasis seen on CT.

Increased uptake is seen throughout the mid face region, somewhat
more on the left than on the right. This area may represent
paranasal sinus disease, although bone lesion in this area cannot be
excluded given the rather intense nature of this increased uptake.

Patient has had total knee replacements bilaterally.

Kidneys are noted in the flank positions bilaterally.
IMPRESSION: 1. Extensive abnormal radiotracer uptake in the inferior left pubic
symphysis, left acetabulum, and left ischium consistent with
sclerotic metastatic disease also seen on recent CT.

2. Small focus of increased uptake in the posterior left twelfth
rib, a finding felt to represent a metastatic focus.

3. Sclerotic area in the leftward aspect of the L2 vertebral body
seen on CT is not appreciated on this nuclear medicine study. This
area does appear suspicious for a metastatic focus on CT.

4. Increased uptake in the mid face region. Advise radiographic
correlation with this area. Question bony abnormality versus
paranasal sinus disease as the cause for the increased uptake in
this area.

## 2019-10-17 ENCOUNTER — Ambulatory Visit (HOSPITAL_COMMUNITY)
Admission: RE | Admit: 2019-10-17 | Discharge: 2019-10-17 | Disposition: A | Discharge status: Home / Self Care | Payer: PPO | Source: Ambulatory Visit | Attending: Cardiology | Admitting: Cardiology

## 2019-10-17 ENCOUNTER — Other Ambulatory Visit: Payer: Self-pay

## 2019-10-17 VITALS — BP 130/85 | HR 79 | Wt 157.8 lb

## 2019-10-17 DIAGNOSIS — Z955 Presence of coronary angioplasty implant and graft: Secondary | ICD-10-CM | POA: Diagnosis not present

## 2019-10-17 DIAGNOSIS — J45909 Unspecified asthma, uncomplicated: Secondary | ICD-10-CM | POA: Diagnosis not present

## 2019-10-17 DIAGNOSIS — Z8546 Personal history of malignant neoplasm of prostate: Secondary | ICD-10-CM | POA: Insufficient documentation

## 2019-10-17 DIAGNOSIS — Z8701 Personal history of pneumonia (recurrent): Secondary | ICD-10-CM | POA: Insufficient documentation

## 2019-10-17 DIAGNOSIS — I130 Hypertensive heart and chronic kidney disease with heart failure and stage 1 through stage 4 chronic kidney disease, or unspecified chronic kidney disease: Secondary | ICD-10-CM | POA: Insufficient documentation

## 2019-10-17 DIAGNOSIS — Z87440 Personal history of urinary (tract) infections: Secondary | ICD-10-CM | POA: Insufficient documentation

## 2019-10-17 DIAGNOSIS — Z7901 Long term (current) use of anticoagulants: Secondary | ICD-10-CM | POA: Diagnosis not present

## 2019-10-17 DIAGNOSIS — M25552 Pain in left hip: Secondary | ICD-10-CM | POA: Diagnosis not present

## 2019-10-17 DIAGNOSIS — I2510 Atherosclerotic heart disease of native coronary artery without angina pectoris: Secondary | ICD-10-CM | POA: Diagnosis not present

## 2019-10-17 DIAGNOSIS — M25551 Pain in right hip: Secondary | ICD-10-CM | POA: Insufficient documentation

## 2019-10-17 DIAGNOSIS — E785 Hyperlipidemia, unspecified: Secondary | ICD-10-CM | POA: Diagnosis not present

## 2019-10-17 DIAGNOSIS — I5032 Chronic diastolic (congestive) heart failure: Secondary | ICD-10-CM | POA: Insufficient documentation

## 2019-10-17 DIAGNOSIS — Z8673 Personal history of transient ischemic attack (TIA), and cerebral infarction without residual deficits: Secondary | ICD-10-CM | POA: Insufficient documentation

## 2019-10-17 DIAGNOSIS — Z8249 Family history of ischemic heart disease and other diseases of the circulatory system: Secondary | ICD-10-CM | POA: Diagnosis not present

## 2019-10-17 DIAGNOSIS — Z79899 Other long term (current) drug therapy: Secondary | ICD-10-CM | POA: Insufficient documentation

## 2019-10-17 DIAGNOSIS — I480 Paroxysmal atrial fibrillation: Secondary | ICD-10-CM | POA: Insufficient documentation

## 2019-10-17 DIAGNOSIS — Z9079 Acquired absence of other genital organ(s): Secondary | ICD-10-CM | POA: Diagnosis not present

## 2019-10-17 DIAGNOSIS — N189 Chronic kidney disease, unspecified: Secondary | ICD-10-CM | POA: Insufficient documentation

## 2019-10-17 DIAGNOSIS — K219 Gastro-esophageal reflux disease without esophagitis: Secondary | ICD-10-CM | POA: Insufficient documentation

## 2019-10-17 LAB — BASIC METABOLIC PANEL
Anion gap: 7 (ref 5–15)
BUN: 24 mg/dL — ABNORMAL HIGH (ref 8–23)
CO2: 26 mmol/L (ref 22–32)
Calcium: 8.9 mg/dL (ref 8.9–10.3)
Chloride: 104 mmol/L (ref 98–111)
Creatinine, Ser: 1.22 mg/dL (ref 0.61–1.24)
GFR calc Af Amer: >60 mL/min (ref >60)
GFR calc non Af Amer: 53 mL/min — ABNORMAL LOW (ref >60)
Glucose, Bld: 124 mg/dL — ABNORMAL HIGH (ref 70–99)
Potassium: 4.1 mmol/L (ref 3.5–5.1)
Sodium: 137 mmol/L (ref 135–145)

## 2019-10-17 LAB — LIPID PANEL
Cholesterol: 88 mg/dL (ref 0–200)
HDL: 39 mg/dL — ABNORMAL LOW (ref >40)
LDL Cholesterol: 40 mg/dL (ref 0–99)
Total CHOL/HDL Ratio: 2.3 RATIO
Triglycerides: 47 mg/dL (ref <150)
VLDL: 9 mg/dL (ref 0–40)

## 2019-10-17 LAB — CBC
HCT: 37.4 % — ABNORMAL LOW (ref 39.0–52.0)
Hemoglobin: 11.4 g/dL — ABNORMAL LOW (ref 13.0–17.0)
MCH: 29.2 pg (ref 26.0–34.0)
MCHC: 30.5 g/dL (ref 30.0–36.0)
MCV: 95.7 fL (ref 80.0–100.0)
Platelets: 268 10*3/uL (ref 150–400)
RBC: 3.91 MIL/uL — ABNORMAL LOW (ref 4.22–5.81)
RDW: 13.2 % (ref 11.5–15.5)
WBC: 7.4 10*3/uL (ref 4.0–10.5)
nRBC: 0 % (ref 0.0–0.2)

## 2019-10-17 LAB — MAGNESIUM: Magnesium: 1.9 mg/dL (ref 1.7–2.4)

## 2019-10-17 NOTE — Progress Notes (Signed)
Patient ID: [name] Spence, MD, male   DOB: May 22, 1929, 86 y.o.   MRN: [id number] PCP: [name], MD Cardiology: Dr. 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  He had an RV infarct with chronic total occlusion of a small nondominant RCA in the 1980s.  He had a drug-eluting stent to the CFX in 11/10. He is very symptomatic with atrial fibrillation episodes. He has had a chronic pattern of mild shortness of breath and mild left shoulder pain/chest aching when walking up a hill (x 20 years).   He can walk 3 miles on flat ground without problems.  He golfs 3-4 times a week.  He walks for 20-30 minutes on a treadmill several times a week.   Echo in 7/16 showed normal LV systolic function, EF 46-96% with moderate diastolic dysfunction.  Moderate TR with PA systolic pressure 65 mmHg.  Normal RV size and systolic function.  Given volume overload on exam and abnormal echo, he was started on low dose Lasix in the past.    He was started on Multaq but continued to have episodes of atrial fibrillation.  This was stopped, and he was admitted for Tikosyn loading.  After he went home, he developed left facial droop and left arm weakness.  He was found to have a small right parietal cortical infarct with hemorrhagic conversion.  His symptoms resolved quickly. INR was subtherapeutic at admission.  Warfarin was stopped and he was put on apixaban.   In 9/17, he was admitted with LLL PNA.  During the admission, he had a couple of relatively short runs of atrial fibrillation.  He developed more frequent episodes of atrial fibrillation at home and was started on Tikosyn.   In 5/19, he had bilateral orchiectomy for metastatic prostate cancer.  His PSA has come down considerably.   In 1/20, he had influenza that progressed to PNA.  He was hospitalized with transient atrial fibrillation.    In 11/20, he was  admitted with an E coli UTI and urosepsis with hypotension and AKI.  He was initially fluid-resuscitated and later required diuresis.  He was in atrial fibrillation while in the hospital.   He has been doing well recently.  He feels like he has on average about 1 short atrial fibrillation run/month, usually lasting 20-30 minutes.  He has occasional orthostatic symptoms, seems worse on hot days when he has been outside.  He is playing 18 holes of golf about twice a week.  He has a Physiological scientist who works with him a couple of times/week.  Stable breathing, mild dyspnea walking up a hill. No chest pain.  He also gets mild bilateral hip pain walking up a hill. Weight is up 2 lbs.    ECG (personally reviewed): NSR, nonspecific T wave flattening, QTc 451 msec   Labs (6/11): LDL 63, HDL 35, K 5.5, creatinine 1.1 Labs (6/12): LDL 58, HDL 53, TSH normal, K 4.7, creatinine 1 Labs (12/12): BNP 79 Labs (7/14): K 5, creatinine 1.1, LDL 48, HDL 34 Labs (8/15): LDL 48, HDL 33, TSH normal, K 5.4, creatinine 1.2 Labs (6/16): K 5.1, creatinine 1.27, HCT 42.5 Labs (10/16): LDL 37, HDL 32 Labs (11/16): K 4.4, creatinine 1.36, BNP 103 Labs  (2/17): K 4.5, creatinine 1.38, Mg 2.0, LDL 32 Labs (3/17): HCT 41.7 Labs (5/17): K 4.2, creatinine 1.17, Mg 2.1 Labs (9/17): K 4.2, creatinine 1.16,  HCT 37.3 Labs (11/17): hgb 13.3, LDL 40 Labs (5/18): K 4.5, creatinine 1.25 Labs (5/19): K 5.5, creatinine 1.16 Labs (2/20): K 4, creatinine 1.24, hgb 12.2 Labs (11/20): K 3.4, creatinine 1.34, hgb 10.3, LDL 49 Labs (12/20): K 4.5, creatinine 1.2 Labs (2/21): K 3.9, creatinine 1.5, TSH 4.5 (mildly elevated) Labs (5/21): TSH normal, K 5, creatinine 1.27  PMH: 1. CAD: RV infarct in the 1980s.  Chronic total occlusion of a small nondominant RCA.  11/10 PCI to CFX with Xience DES.  EF 60% on LV-gram at that time. ETT (11/14) with 9'07" exercise, no significant ST depression.  2.  Paroxysmal atrial fibrillation: Multaq was  ineffective. Now on Tikosyn.  3.  Hyperlipidemia 4. GERD 5. HTN 6. Asthma 7. Chronic diastolic CHF: Echo (1/75): EF 55-60%, mild LV hypertrophy, no regional WMAs, mild AI, mild MR, mild-moderate biatrial enlargement, PA systolic pressure 51 mmHg. Echo (7/16) with EF 60-65%, moderate diastolic dysfunction, normal RV size and systolic function, mild-moderate MR, moderate TR, moderate biatrial enlargement, PA systolic pressure 65 mmHg.  - Echo (5/18): EF 55-60%, mild AI, mild MR, PASP 60 mmHg, normal RV size and systolic function.  - Echo (8/19): EF 60-65%, normal RV size and systolic function, severe TR with PASP 60 mmHg.  8. CKD 9. CVA: Right parietal cortical CVA with peri-infarct hemorrhagic conversion.  CTA showed right M2 branch flow gap.   10. Prostate cancer: Metastatic.  - Bilateral orchiectomy in 5/19.   SH: Nonsmoker.  Retired Stage manager, lives in [place].  2 daughters.  Originally from [place].   FH: Brother with CABG.   ROS: All systems reviewed and negative except as per HPI.    Current Outpatient Medications  Medication Sig Dispense Refill  . albuterol (VENTOLIN HFA) 108 (90 Base) MCG/ACT inhaler Inhale 2 puffs into the lungs every 4 (four) hours as needed for wheezing or shortness of breath. 18 g 2  . apixaban (ELIQUIS) 5 MG TABS tablet Take 1 tablet (5 mg total) by mouth 2 (two) times daily. 180 tablet 3  . atorvastatin (LIPITOR) 20 MG tablet TAKE 1 TABLET(20 MG) BY MOUTH DAILY 90 tablet 3  . buPROPion (WELLBUTRIN SR) 150 MG 12 hr tablet Take 1 tablet (150 mg total) by mouth daily. 90 tablet 3  . cetirizine (ZYRTEC) 10 MG tablet Take 10 mg by mouth daily with supper.     . Cholecalciferol (VITAMIN D) 2000 units tablet Take 2,000 Units by mouth daily.    Marland Kitchen diltiazem (CARDIZEM CD) 120 MG 24 hr capsule Take 120 mg by mouth daily as needed.    . dofetilide (TIKOSYN) 250 MCG capsule Take 1 capsule (250 mcg total) by mouth 2 (two) times daily. 180 capsule 3  . fluticasone  (FLONASE) 50 MCG/ACT nasal spray SHAKE LIQUID AND USE 2 SPRAYS IN EACH NOSTRIL DAILY 48 g 3  . Fluticasone Furoate (ARNUITY ELLIPTA) 200 MCG/ACT AEPB Inhale 1 puff into the lungs daily. 2 each 5  . LORazepam (ATIVAN) 1 MG tablet TAKE 1/2 TO 1 TABLET BY MOUTH AT BEDTIME AS NEEDED 90 tablet 1  . nitroGLYCERIN (NITROSTAT) 0.4 MG SL tablet Place 1 tablet (0.4 mg total) under the tongue every 5 (five) minutes x 3 doses as needed for chest pain. 25 tablet 2  . omeprazole (PRILOSEC OTC) 20 MG tablet Take 1 tablet (20 mg total) by mouth daily. 100 tablet 4  . tamsulosin (FLOMAX) 0.4 MG CAPS capsule Take 1 capsule (0.4 mg total) by mouth daily. 90 capsule 3  .  traMADol (ULTRAM) 50 MG tablet TAKE 1 TABLET(50 MG) BY MOUTH EVERY 12 HOURS AS NEEDED 120 tablet 0   No current facility-administered medications for this encounter.    BP 130/85   Pulse 79   Wt 71.6 kg (157 lb 12.8 oz)   SpO2 98%   BMI 22.97 kg/m  General: NAD Neck: No JVD, no thyromegaly or thyroid nodule.  Lungs: Occasional rhonchi. CV: Nondisplaced PMI.  Heart regular S1/S2, no S3/S4, no murmur.  Trace ankle edema.  No carotid bruit.  Unable to palpate PT pedal pulses.  Abdomen: Soft, nontender, no hepatosplenomegaly, no distention.  Skin: Intact without lesions or rashes.  Neurologic: Alert and oriented x 3.  Psych: Normal affect. Extremities: No clubbing or cyanosis.  HEENT: Normal.   Assessment/Plan: 1. CAD: No chest pain.    - He is on apixaban without ASA given stable CAD.   - Continue statin 2. Hyperlipidemia: Check lipids today.  3. Chronic diastolic CHF:  Most recent echo in 8/19 showed EF 65-68% but PA systolic pressure was estimated at 60 mmHg and there was severe TR (progressed from moderate), the RV looked normal.  I suspect that he has a degree of RV failure accounting for NYHA class II symptoms.  He is not taking Lasix now and weight is stable.  He is not volume overloaded on exam.  - He wants to hold off on repeat  echo.  Given age, think this is reasonable as unlikely to change management much.   - He can stay off Lasix for now.  4. Atrial fibrillation: Paroxysmal.  Episodes are quite symptomatic and usually triggered by some form of physical stress (infection typically), he feels "bad" with atrial fibrillation even when he's rate-controlled. He had frequent breakthrough on Multaq but and is now on Tikosyn.  Now only having rare, short AF episodes.  QTc ok on today's echo.  He is now off diltiazem CD and atenolol due to low BP and orthostasis, uses diltiazem prn when he feels an AF run.   - Continue Tikosyn.  BMET/Mg today.  - If he develops frequent breakthrough afib on Tikosyn, amiodarone would be an option.  - Now on apixaban with CVA on warfarin (was subtherapeutic). CBC today.  5. Orthostatic symptoms: BP stable today.  Recommended that he stay hydrated and wear compression stockings.  6. Bilateral hip pain: Unable to palpate PT pulses.  Possible that this is claudication, but it is relatively mild.  We discussed ABIs but decided to hold off as it would not change management => symptoms are not lifestyle-hindering and he has no pedal ulcers/lesions. He will continue statin with goal LDL < 70.   Followup in 6 months.     Loralie Champagne 10/17/2019

## 2019-10-17 NOTE — Progress Notes (Signed)
Medication Samples have been provided to the patient.  Drug name: Eliquis       Strength: 5mg         Qty: 4 boxes (14 tablets each)  LOT: [id number]  Exp.Date: 09/2021  Dosing instructions: take 1 tablet twice daily  The patient has been instructed regarding the correct time, dose, and frequency of taking this medication, including desired effects and most common side effects.   Malena Edman 10:52 AM 10/17/2019

## 2019-10-17 NOTE — Patient Instructions (Signed)
Labs done today, your results will be available in MyChart, we will contact you for abnormal readings.  Please call our office in February 2022 to schedule your follow up appointment  If you have any questions or concerns before your next appointment please send [place] a message through [place] or call our office at [phone].    TO LEAVE A MESSAGE FOR THE NURSE SELECT OPTION 2, PLEASE LEAVE A MESSAGE INCLUDING: . YOUR NAME . DATE OF BIRTH . CALL BACK NUMBER . REASON FOR CALL**this is important as we prioritize the call backs  [place] AS LONG AS YOU CALL BEFORE 4:00 PM  At the [facility], you and your health needs are our priority. As part of our continuing mission to provide you with exceptional heart care, we have created designated Provider Care Teams. These Care Teams include your primary Cardiologist (physician) and Advanced Practice Providers (APPs- Physician Assistants and Nurse Practitioners) who all work together to provide you with the care you need, when you need it.   You may see any of the following providers on your designated Care Team at your next follow up: Marland Kitchen Dr Glori Bickers . Dr Loralie Champagne . Darrick Grinder, NP . Lyda Jester, PA . Audry Riles, PharmD   Please be sure to bring in all your medications bottles to every appointment.

## 2019-10-21 ENCOUNTER — Other Ambulatory Visit: Payer: Self-pay | Admitting: Family Medicine

## 2019-11-28 DIAGNOSIS — D1801 Hemangioma of skin and subcutaneous tissue: Secondary | ICD-10-CM | POA: Diagnosis not present

## 2019-11-28 DIAGNOSIS — L570 Actinic keratosis: Secondary | ICD-10-CM | POA: Diagnosis not present

## 2019-11-28 DIAGNOSIS — D2272 Melanocytic nevi of left lower limb, including hip: Secondary | ICD-10-CM | POA: Diagnosis not present

## 2019-11-28 DIAGNOSIS — Z85828 Personal history of other malignant neoplasm of skin: Secondary | ICD-10-CM | POA: Diagnosis not present

## 2019-11-28 DIAGNOSIS — L821 Other seborrheic keratosis: Secondary | ICD-10-CM | POA: Diagnosis not present

## 2019-12-31 DIAGNOSIS — Z961 Presence of intraocular lens: Secondary | ICD-10-CM | POA: Diagnosis not present

## 2019-12-31 DIAGNOSIS — H43813 Vitreous degeneration, bilateral: Secondary | ICD-10-CM | POA: Diagnosis not present

## 2019-12-31 DIAGNOSIS — H52202 Unspecified astigmatism, left eye: Secondary | ICD-10-CM | POA: Diagnosis not present

## 2019-12-31 DIAGNOSIS — H26492 Other secondary cataract, left eye: Secondary | ICD-10-CM | POA: Diagnosis not present

## 2020-01-11 ENCOUNTER — Other Ambulatory Visit: Payer: Self-pay | Admitting: Family Medicine

## 2020-01-24 DIAGNOSIS — H26492 Other secondary cataract, left eye: Secondary | ICD-10-CM | POA: Diagnosis not present

## 2020-01-26 ENCOUNTER — Other Ambulatory Visit: Payer: PPO

## 2020-01-26 DIAGNOSIS — Z20822 Contact with and (suspected) exposure to covid-19: Secondary | ICD-10-CM | POA: Diagnosis not present

## 2020-01-29 LAB — NOVEL CORONAVIRUS, NAA: SARS-CoV-2, NAA: NOT DETECTED

## 2020-02-04 DIAGNOSIS — C61 Malignant neoplasm of prostate: Secondary | ICD-10-CM | POA: Diagnosis not present

## 2020-02-11 DIAGNOSIS — N401 Enlarged prostate with lower urinary tract symptoms: Secondary | ICD-10-CM | POA: Diagnosis not present

## 2020-02-11 DIAGNOSIS — C7951 Secondary malignant neoplasm of bone: Secondary | ICD-10-CM | POA: Diagnosis not present

## 2020-02-11 DIAGNOSIS — R351 Nocturia: Secondary | ICD-10-CM | POA: Diagnosis not present

## 2020-02-11 DIAGNOSIS — C61 Malignant neoplasm of prostate: Secondary | ICD-10-CM | POA: Diagnosis not present

## 2020-02-11 DIAGNOSIS — C778 Secondary and unspecified malignant neoplasm of lymph nodes of multiple regions: Secondary | ICD-10-CM | POA: Diagnosis not present

## 2020-02-23 ENCOUNTER — Other Ambulatory Visit: Payer: Self-pay | Admitting: Family Medicine

## 2020-04-09 ENCOUNTER — Ambulatory Visit (INDEPENDENT_AMBULATORY_CARE_PROVIDER_SITE_OTHER): Payer: PPO

## 2020-04-09 ENCOUNTER — Other Ambulatory Visit: Payer: Self-pay

## 2020-04-09 DIAGNOSIS — Z0000 Encounter for general adult medical examination without abnormal findings: Secondary | ICD-10-CM | POA: Diagnosis not present

## 2020-04-09 NOTE — Patient Instructions (Addendum)
[name] [name] , Thank you for taking time to come for your Medicare Wellness Visit. I appreciate your ongoing commitment to your health goals. Please review the following plan we discussed and let me know if I can assist you in the future.   Screening recommendations/referrals: Colonoscopy: No longer required Recommended yearly ophthalmology/optometry visit for glaucoma screening and checkup Recommended yearly dental visit for hygiene and checkup  Vaccinations: Influenza vaccine: Done 11/12/19 Pneumococcal vaccine: Up to date Tdap vaccine: Up to date Shingles vaccine: Completed 9/30, 10/15, & 01/11/18   Covid-19: Completed 03/19/19 & 04/08/19  Advanced directives: Please bring a copy of your health care power of attorney and living will to the office at your convenience.  Conditions/risks identified: None at this time  Next appointment: Follow up in one year for your annual wellness visit.   Preventive Care 70 Years and Older, Male Preventive care refers to lifestyle choices and visits with your health care provider that can promote health and wellness. What does preventive care include?  A yearly physical exam. This is also called an annual well check.  Dental exams once or twice a year.  Routine eye exams. Ask your health care provider how often you should have your eyes checked.  Personal lifestyle choices, including:  Daily care of your teeth and gums.  Regular physical activity.  Eating a healthy diet.  Avoiding tobacco and drug use.  Limiting alcohol use.  Practicing safe sex.  Taking low doses of aspirin every day.  Taking vitamin and mineral supplements as recommended by your health care provider. What happens during an annual well check? The services and screenings done by your health care provider during your annual well check will depend on your age, overall health, lifestyle risk factors, and family history of disease. Counseling  Your health care provider may  ask you questions about your:  Alcohol use.  Tobacco use.  Drug use.  Emotional well-being.  Home and relationship well-being.  Sexual activity.  Eating habits.  History of falls.  Memory and ability to understand (cognition).  Work and work Statistician. Screening  You may have the following tests or measurements:  Height, weight, and BMI.  Blood pressure.  Lipid and cholesterol levels. These may be checked every 5 years, or more frequently if you are over 58 years old.  Skin check.  Lung cancer screening. You may have this screening every year starting at age 31 if you have a 30-pack-year history of smoking and currently smoke or have quit within the past 15 years.  Fecal occult blood test (FOBT) of the stool. You may have this test every year starting at age 81.  Flexible sigmoidoscopy or colonoscopy. You may have a sigmoidoscopy every 5 years or a colonoscopy every 10 years starting at age 80.  Prostate cancer screening. Recommendations will vary depending on your family history and other risks.  Hepatitis C blood test.  Hepatitis B blood test.  Sexually transmitted disease (STD) testing.  Diabetes screening. This is done by checking your blood sugar (glucose) after you have not eaten for a while (fasting). You may have this done every 1-3 years.  Abdominal aortic aneurysm (AAA) screening. You may need this if you are a current or former smoker.  Osteoporosis. You may be screened starting at age 63 if you are at high risk. Talk with your health care provider about your test results, treatment options, and if necessary, the need for more tests. Vaccines  Your health care provider may recommend  certain vaccines, such as:  Influenza vaccine. This is recommended every year.  Tetanus, diphtheria, and acellular pertussis (Tdap, Td) vaccine. You may need a Td booster every 10 years.  Zoster vaccine. You may need this after age 9.  Pneumococcal 13-valent  conjugate (PCV13) vaccine. One dose is recommended after age 81.  Pneumococcal polysaccharide (PPSV23) vaccine. One dose is recommended after age 45. Talk to your health care provider about which screenings and vaccines you need and how often you need them. This information is not intended to replace advice given to you by your health care provider. Make sure you discuss any questions you have with your health care provider. Document Released: 02/21/2015 Document Revised: 10/15/2015 Document Reviewed: 11/26/2014 Elsevier Interactive Patient Education  2017 [employer] Prevention in the Home Falls can cause injuries. They can happen to people of all ages. There are many things you can do to make your home safe and to help prevent falls. What can I do on the outside of my home?  Regularly fix the edges of walkways and driveways and fix any cracks.  Remove anything that might make you trip as you walk through a door, such as a raised step or threshold.  Trim any bushes or trees on the path to your home.  Use bright outdoor lighting.  Clear any walking paths of anything that might make someone trip, such as rocks or tools.  Regularly check to see if handrails are loose or broken. Make sure that both sides of any steps have handrails.  Any raised decks and porches should have guardrails on the edges.  Have any leaves, snow, or ice cleared regularly.  Use sand or salt on walking paths during winter.  Clean up any spills in your garage right away. This includes oil or grease spills. What can I do in the bathroom?  Use night lights.  Install grab bars by the toilet and in the tub and shower. Do not use towel bars as grab bars.  Use non-skid mats or decals in the tub or shower.  If you need to sit down in the shower, use a plastic, non-slip stool.  Keep the floor dry. Clean up any water that spills on the floor as soon as it happens.  Remove soap buildup in the tub or  shower regularly.  Attach bath mats securely with double-sided non-slip rug tape.  Do not have throw rugs and other things on the floor that can make you trip. What can I do in the bedroom?  Use night lights.  Make sure that you have a light by your bed that is easy to reach.  Do not use any sheets or blankets that are too big for your bed. They should not hang down onto the floor.  Have a firm chair that has side arms. You can use this for support while you get dressed.  Do not have throw rugs and other things on the floor that can make you trip. What can I do in the kitchen?  Clean up any spills right away.  Avoid walking on wet floors.  Keep items that you use a lot in easy-to-reach places.  If you need to reach something above you, use a strong step stool that has a grab bar.  Keep electrical cords out of the way.  Do not use floor polish or wax that makes floors slippery. If you must use wax, use non-skid floor wax.  Do not have throw rugs and other  things on the floor that can make you trip. What can I do with my stairs?  Do not leave any items on the stairs.  Make sure that there are handrails on both sides of the stairs and use them. Fix handrails that are broken or loose. Make sure that handrails are as long as the stairways.  Check any carpeting to make sure that it is firmly attached to the stairs. Fix any carpet that is loose or worn.  Avoid having throw rugs at the top or bottom of the stairs. If you do have throw rugs, attach them to the floor with carpet tape.  Make sure that you have a light switch at the top of the stairs and the bottom of the stairs. If you do not have them, ask someone to add them for you. What else can I do to help prevent falls?  Wear shoes that:  Do not have high heels.  Have rubber bottoms.  Are comfortable and fit you well.  Are closed at the toe. Do not wear sandals.  If you use a stepladder:  Make sure that it is fully  opened. Do not climb a closed stepladder.  Make sure that both sides of the stepladder are locked into place.  Ask someone to hold it for you, if possible.  Clearly mark and make sure that you can see:  Any grab bars or handrails.  First and last steps.  Where the edge of each step is.  Use tools that help you move around (mobility aids) if they are needed. These include:  Canes.  Walkers.  Scooters.  Crutches.  Turn on the lights when you go into a dark area. Replace any light bulbs as soon as they burn out.  Set up your furniture so you have a clear path. Avoid moving your furniture around.  If any of your floors are uneven, fix them.  If there are any pets around you, be aware of where they are.  Review your medicines with your doctor. Some medicines can make you feel dizzy. This can increase your chance of falling. Ask your doctor what other things that you can do to help prevent falls. This information is not intended to replace advice given to you by your health care provider. Make sure you discuss any questions you have with your health care provider. Document Released: 11/21/2008 Document Revised: 07/03/2015 Document Reviewed: 03/01/2014 Elsevier Interactive Patient Education  2017 [employer].

## 2020-04-09 NOTE — Progress Notes (Signed)
Virtual Visit via Telephone Note  I connected with  [name] Spence, [name] [name] on 04/09/20 at  9:30 AM EST by telephone and verified that I am speaking with the correct person using two identifiers.  Location: Patient: Home Provider: Office Persons participating in the virtual visit: patient/Nurse Health Advisor   I discussed the limitations, risks, security and privacy concerns of performing an evaluation and management service by telephone and the availability of in person appointments. The patient expressed understanding and agreed to proceed.  Interactive audio and video telecommunications were attempted between this nurse and patient, however failed, due to patient having technical difficulties OR patient did not have access to video capability.  We continued and completed visit with audio only.  Some vital signs may be absent or patient reported.   Willette Brace, LPN    Subjective:   [name] Spence, [name] [name] is a 87 y.o. male who presents for Medicare Annual/Subsequent preventive examination.  Review of Systems     Cardiac Risk Factors include: advanced age (>68mn, >>16women);hypertension;dyslipidemia;male gender     Objective:    There were no vitals filed for this visit. There is no height or weight on file to calculate BMI.  Advanced Directives 04/09/2020 03/26/2019 12/27/2018 03/20/2018 03/08/2018 02/27/2018 02/25/2018  Does Patient Have a Medical Advance Directive? _0  Yes Yes  Type of AParamed[place]of [place] - [place] Living will;Healthcare Power of [place] -  Does patient want to make changes to medical advance directive? - No - Patient declined No - Patient declined No - Patient declined No - Patient declined No - Patient declined -  Copy of [place]in Chart? No - copy requested - No - copy requested  No - copy requested No - copy requested No - copy requested -  Would patient like information on creating a medical advance directive? - - - - - - -    Current Medications (verified) Outpatient Encounter Medications as of 04/09/2020  Medication Sig  . albuterol (VENTOLIN HFA) 108 (90 Base) MCG/ACT inhaler Inhale 2 puffs into the lungs every 4 (four) hours as needed for wheezing or shortness of breath.  .Marland Kitchenapixaban (ELIQUIS) 5 MG TABS tablet Take 1 tablet (5 mg total) by mouth 2 (two) times daily.  .Marland Kitchenatorvastatin (LIPITOR) 20 MG tablet TAKE 1 TABLET(20 MG) BY MOUTH DAILY  . buPROPion (WELLBUTRIN SR) 150 MG 12 hr tablet TAKE 1 TABLET(150 MG) BY MOUTH DAILY  . cetirizine (ZYRTEC) 10 MG tablet Take 10 mg by mouth daily with supper.   . Cholecalciferol (VITAMIN D) 2000 units tablet Take 2,000 Units by mouth daily.  .Marland Kitchendofetilide (TIKOSYN) 250 MCG capsule Take 1 capsule (250 mcg total) by mouth 2 (two) times daily.  . fluticasone (FLONASE) 50 MCG/ACT nasal spray SHAKE LIQUID AND USE 2 SPRAYS IN EACH NOSTRIL DAILY  . Fluticasone Furoate (ARNUITY ELLIPTA) 200 MCG/ACT AEPB Inhale 1 puff into the lungs daily.  .Marland KitchenLORazepam (ATIVAN) 1 MG tablet TAKE 1/2 TO 1 TABLET BY MOUTH AT BEDTIME AS NEEDED  . omeprazole (PRILOSEC OTC) 20 MG tablet Take 1 tablet (20 mg total) by mouth daily.  . tamsulosin (FLOMAX) 0.4 MG CAPS capsule TAKE 1 CAPSULE(0.4 MG) BY MOUTH DAILY  . diltiazem (CARDIZEM CD) 120 MG 24 hr capsule Take 120 mg by mouth daily as needed. (Patient not taking: Reported on 04/09/2020)  .  FLUZONE HIGH-DOSE QUADRIVALENT 0.7 ML SUSY   . nitroGLYCERIN (NITROSTAT) 0.4 MG SL tablet Place 1 tablet (0.4 mg total) under the tongue every 5 (five) minutes x 3 doses as needed for chest pain. (Patient not taking: Reported on 04/09/2020)  . traMADol (ULTRAM) 50 MG tablet TAKE 1 TABLET(50 MG) BY MOUTH EVERY 12 HOURS AS NEEDED (Patient not taking: No sig reported)   No facility-administered encounter medications on file as of  04/09/2020.    Allergies (verified) Tylenol [acetaminophen], Azithromycin, Advil [ibuprofen], and Asa [aspirin]   History: Past Medical History:  Diagnosis Date  . Allergic rhinitis   . Anxiety   . Asthma   . Atrial fibrillation ([place])   . CAD (coronary artery disease)   . Depression   . Diverticulosis 03/16/1995  . DJD (degenerative joint disease)    "most joints" (03/11/2015)  . GERD (gastroesophageal reflux disease) 12/30/2000  . Heart attack ([place]) 1984  . Hiatal hernia 12/30/2000  . History of blood transfusion 1988   "related to GI bleeding OR"  . History of duodenal ulcer 10/15/1986  . Hyperlipidemia   . Hypertension   . Sepsis ([place]) 12/2018  . Sinus bradycardia   . Stroke [facility])    Past Surgical History:  Procedure Laterality Date  . CARDIAC CATHETERIZATION    . CATARACT EXTRACTION W/ INTRAOCULAR LENS  IMPLANT, BILATERAL Bilateral ?2013  . CHOLECYSTECTOMY OPEN  1989  . CORONARY ANGIOPLASTY WITH STENT PLACEMENT  2010  . hospital [facility]  1984   heart attack, hypoplastic right coronary  . INGUINAL HERNIA REPAIR Right ~ 1990  . NASAL SEPTUM SURGERY  1972  . ORCHIECTOMY Bilateral 06/10/2017   Procedure: BILATERAL ORCHIECTOMY;  Surgeon: Franchot Gallo, [name] [name];  Location: [facility] ORS;  Service: Urology;  Laterality: Bilateral;  MAC ANESTHESIA AND LOCAL  . TONSILLECTOMY    . TOTAL KNEE ARTHROPLASTY Bilateral 1998-2004   "right-left"  . VAGOTOMY AND PYLOROPLASTY  1988   "bleeding duodenal ulcer"   Family History  Problem Relation Age of Onset  . Coronary artery disease Mother        deceased  . Deep vein thrombosis Sister   . Coronary artery disease Brother   . Asthma Brother   . Colon cancer Neg Hx    Social History   Socioeconomic History  . Marital status: Married    Spouse name: Not on file  . Number of children: 2  . Years of education: Not on file  . Highest education level: Doctorate  Occupational History  . Occupation: DOCTOR    Employer: RETIRED    Comment:  radiologist  Tobacco Use  . Smoking status: Never Smoker  . Smokeless tobacco: Never Used  Vaping Use  . Vaping Use: Never used  Substance and Sexual Activity  . Alcohol use: No  . Drug use: No  . Sexual activity: Not Currently  Other Topics Concern  . Not on file  Social History Narrative   Married. Regular exercise - yes.       03/26/2019: Married to Therapist, sports, lives in 3 story home, although lives on first level mostly. Has two grown children.      Enjoys golfing; drives to [place] with wife every winter for a few days, watches sports, following Merchandiser, retail, reads about WWII,    Social Determinants of Radio broadcast assistant Strain: Low Risk   . Difficulty of Paying Living Expenses: Not hard at all  Food Insecurity: No Food Insecurity  . Worried About Charity fundraiser  in the Last Year: Never true  . Ran Out of Food in the Last Year: Never true  Transportation Needs: No Transportation Needs  . Lack of Transportation (Medical): No  . Lack of Transportation (Non-Medical): No  Physical Activity: Sufficiently Active  . Days of Exercise per Week: 4 days  . Minutes of Exercise per Session: 60 min  Stress: No Stress Concern Present  . Feeling of Stress : Not at all  Social Connections: Moderately Integrated  . Frequency of Communication with Friends and Family: More than three times a week  . Frequency of Social Gatherings with Friends and Family: Three times a week  . Attends Religious Services: More than 4 times per year  . Active Member of Clubs or Organizations: No  . Attends Archivist Meetings: Never  . Marital Status: Married    Tobacco Counseling Counseling given: Not Answered   Clinical Intake:  Pre-visit preparation completed: Yes  Pain : No/denies pain     BMI - recorded: 23 Nutritional Status: BMI of 19-24  Normal Nutritional Risks: None Diabetes: No  How often do you need to have someone help you when you read instructions, pamphlets, or  other written materials from your doctor or pharmacy?: 1 - Never  Diabetic?No  Interpreter Needed?: No  Information entered by :: Charlott Rakes, LPN   Activities of Daily Living In your present state of health, do you have any difficulty performing the following activities: 04/09/2020  Hearing? Y  Comment wears hearing aids  Vision? N  Difficulty concentrating or making decisions? N  Walking or climbing stairs? N  Dressing or bathing? N  Doing errands, shopping? N  Preparing Food and eating ? N  Using the Toilet? N  In the past six months, have you accidently leaked urine? Y  Comment wears a brief at night  Do you have problems with loss of bowel control? N  Managing your Medications? N  Managing your Finances? N  Housekeeping or managing your Housekeeping? N  Some recent data might be hidden    Patient Care Team: [name], [name] [name] as PCP - General (Family Medicine) Franchot Gallo, [name] [name] as Consulting Physician (Urology) Evans Lance, [name] [name] as Consulting Physician (Cardiology) Shon Hough, [name] [name] as Consulting Physician (Ophthalmology)  Indicate any recent Medical Services you may have received from other than [facility] providers in the past year (date may be approximate).     Assessment:   This is a routine wellness examination for [facility].  Hearing/Vision screen  Hearing Screening   _0  _1  _2  _3  _4  _5  _6  _7  _8   Right ear:           Left ear:           Comments: Pt wears hearing aids  Vision Screening Comments: Pt follows Dr Kathrin Penner for annual eye exams   Dietary issues and exercise activities discussed: Current Exercise Habits: Home exercise routine;Structured exercise class, Type of exercise: walking;Other - see comments (golf), Time (Minutes): 60, Frequency (Times/Week): 4, Weekly Exercise (Minutes/Week): 240  Goals    . Patient Stated     Maintain health    . Patient Stated     None at this time       Depression  Screen PHQ 2/9 Scores 04/09/2020 03/26/2019 03/08/2018 04/12/2016 12/29/2015 11/27/2015 12/02/2014  PHQ - 2 Score 0 0 1 0 0 0 0  PHQ- 9 Score - - 3 - - - -  Exception Documentation - - - Other- indicate reason in  comment box - Other- indicate reason in comment box -    Fall Risk Fall Risk  04/09/2020 03/26/2019 03/08/2018 09/27/2016 04/12/2016  Falls in the past year? 0 0 1 No No  Number falls in past yr: 0 - 0 - -  Injury with Fall? 0 - 1 - -  Comment - - rug burn - -  Risk for fall due to : Impaired vision;Impaired balance/gait;Impaired mobility Medication side effect History of fall(s);Impaired balance/gait;Impaired vision - Other (Comment)  Follow up Falls prevention discussed Falls evaluation completed;Education provided;Falls prevention discussed Falls prevention discussed;Education provided - -    FALL RISK PREVENTION PERTAINING TO THE HOME:  Any stairs in or around the home? Yes  If so, are there any without handrails? No  Home free of loose throw rugs in walkways, pet beds, electrical cords, etc? Yes  Adequate lighting in your home to reduce risk of falls? Yes   ASSISTIVE DEVICES UTILIZED TO PREVENT FALLS:  Life alert? No  Use of a cane, walker or w/c? No  Grab bars in the bathroom? Yes  Shower chair or bench in shower? Yes  Elevated toilet seat or a handicapped toilet? Yes   TIMED UP AND GO:  Was the test performed? No .     Cognitive Function:     6CIT Screen 04/09/2020 03/26/2019  What Year? 0 points 0 points  What month? 0 points 0 points  What time? - 0 points  Count back from 20 0 points 0 points  Months in reverse 0 points 0 points  Repeat phrase 0 points 0 points  Total Score - 0    Immunizations Immunization History  Administered Date(s) Administered  . Fluad Quad(high Dose 65+) 10/17/2018  . Influenza Split 10/23/2012, 10/02/2015  . Influenza Whole 10/27/2007, 11/13/2008, 10/29/2009, 11/09/2010  . Influenza, High Dose Seasonal PF 10/28/2016  .  Influenza,inj,Quad PF,6+ Mos 10/19/2017  . Influenza-Unspecified 12/09/2013, 10/28/2014, 11/12/2019  . PFIZER(Purple Top)SARS-COV-2 Vaccination 03/19/2019, 04/08/2019, 09/27/2019  . Pneumococcal Conjugate-13 09/17/2013  . Pneumococcal Polysaccharide-23 08/30/2000, 11/29/2005  . Td 06/20/2007, 01/11/2018  . Tdap 01/11/2018  . Zoster 06/20/2007  . Zoster Recombinat (Shingrix) 11/07/2017, 11/22/2017, 01/11/2018    TDAP status: Up to date  Flu Vaccine status: Up to date  Pneumococcal vaccine status: Up to date  Covid-19 vaccine status: Completed vaccines  Qualifies for Shingles Vaccine? Yes   Zostavax completed Yes   Shingrix Completed?: Yes  Screening Tests Health Maintenance  Topic Date Due  . COVID-19 Vaccine (4 - Booster for Pfizer series) 03/29/2020  . TETANUS/TDAP  02/05/2028  . INFLUENZA VACCINE  Completed  . PNA vac Low Risk Adult  Completed  . HPV VACCINES  Aged Out    Health Maintenance  Health Maintenance Due  Topic Date Due  . COVID-19 Vaccine (4 - Booster for Pfizer series) 03/29/2020    Colorectal cancer screening: No longer required.    Additional Screening  Vision Screening: Recommended annual ophthalmology exams for early detection of glaucoma and other disorders of the eye. Is the patient up to date with their annual eye exam?  Yes  Who is the provider or what is the name of the office in which the patient attends annual eye exams? Dr Kathrin Penner If pt is not established with a provider, would they like to be referred to a provider to establish care? No .   Dental Screening: Recommended annual dental exams for proper oral hygiene  Community Resource Referral / Chronic Care Management: CRR required this visit?  No   CCM required this visit?  No      Plan:     I have personally reviewed and noted the following in the patient's chart:   . Medical and social history . Use of alcohol, tobacco or illicit drugs  . Current medications and  supplements . Functional ability and status . Nutritional status . Physical activity . Advanced directives . List of other physicians . Hospitalizations, surgeries, and ER visits in previous 12 months . Vitals . Screenings to include cognitive, depression, and falls . Referrals and appointments  In addition, I have reviewed and discussed with patient certain preventive protocols, quality metrics, and best practice recommendations. A written personalized care plan for preventive services as well as general preventive health recommendations were provided to patient.     Willette Brace, LPN   [id number]/02/[id number]   Nurse Notes: None

## 2020-04-23 ENCOUNTER — Other Ambulatory Visit: Payer: Self-pay | Admitting: Family Medicine

## 2020-04-23 DIAGNOSIS — H1033 Unspecified acute conjunctivitis, bilateral: Secondary | ICD-10-CM | POA: Diagnosis not present

## 2020-04-24 NOTE — Telephone Encounter (Signed)
Last refill- 10/22/2019 Last office visit- 08/15/2019  No future office visit has been scheduled

## 2020-04-28 DIAGNOSIS — L300 Nummular dermatitis: Secondary | ICD-10-CM | POA: Diagnosis not present

## 2020-04-28 DIAGNOSIS — Z85828 Personal history of other malignant neoplasm of skin: Secondary | ICD-10-CM | POA: Diagnosis not present

## 2020-04-28 DIAGNOSIS — L282 Other prurigo: Secondary | ICD-10-CM | POA: Diagnosis not present

## 2020-05-03 ENCOUNTER — Other Ambulatory Visit (HOSPITAL_COMMUNITY): Payer: Self-pay | Admitting: Cardiology

## 2020-05-06 ENCOUNTER — Telehealth (HOSPITAL_COMMUNITY): Payer: Self-pay | Admitting: Vascular Surgery

## 2020-05-06 ENCOUNTER — Other Ambulatory Visit (HOSPITAL_COMMUNITY): Payer: Self-pay | Admitting: *Deleted

## 2020-05-06 NOTE — Telephone Encounter (Signed)
Left pt  message giving f/u appt w/ [facility] , asked pt to call back to confirm, or resch for what is best for him

## 2020-05-17 ENCOUNTER — Other Ambulatory Visit (HOSPITAL_COMMUNITY): Payer: Self-pay | Admitting: Cardiology

## 2020-05-21 ENCOUNTER — Other Ambulatory Visit (HOSPITAL_COMMUNITY): Payer: Self-pay | Admitting: *Deleted

## 2020-05-21 MED ORDER — ELIQUIS 5 MG PO TABS: 5.0000 mg | ORAL_TABLET | Freq: Two times a day (BID) | ORAL | 3 refills | Status: DC

## 2020-06-03 ENCOUNTER — Encounter (HOSPITAL_COMMUNITY): Payer: Self-pay | Admitting: Cardiology

## 2020-06-03 ENCOUNTER — Ambulatory Visit (HOSPITAL_COMMUNITY)
Admission: RE | Admit: 2020-06-03 | Discharge: 2020-06-03 | Disposition: A | Discharge status: Home / Self Care | Payer: PPO | Source: Ambulatory Visit | Attending: Cardiology | Admitting: Cardiology

## 2020-06-03 ENCOUNTER — Other Ambulatory Visit: Payer: Self-pay

## 2020-06-03 VITALS — BP 140/80 | HR 73 | Wt 152.2 lb

## 2020-06-03 DIAGNOSIS — I480 Paroxysmal atrial fibrillation: Secondary | ICD-10-CM

## 2020-06-03 DIAGNOSIS — I5032 Chronic diastolic (congestive) heart failure: Secondary | ICD-10-CM

## 2020-06-03 DIAGNOSIS — I2510 Atherosclerotic heart disease of native coronary artery without angina pectoris: Secondary | ICD-10-CM | POA: Diagnosis not present

## 2020-06-03 DIAGNOSIS — E785 Hyperlipidemia, unspecified: Secondary | ICD-10-CM | POA: Diagnosis not present

## 2020-06-03 DIAGNOSIS — N189 Chronic kidney disease, unspecified: Secondary | ICD-10-CM | POA: Diagnosis not present

## 2020-06-03 DIAGNOSIS — Z79899 Other long term (current) drug therapy: Secondary | ICD-10-CM | POA: Insufficient documentation

## 2020-06-03 DIAGNOSIS — J45909 Unspecified asthma, uncomplicated: Secondary | ICD-10-CM | POA: Insufficient documentation

## 2020-06-03 DIAGNOSIS — Z7901 Long term (current) use of anticoagulants: Secondary | ICD-10-CM | POA: Insufficient documentation

## 2020-06-03 DIAGNOSIS — I130 Hypertensive heart and chronic kidney disease with heart failure and stage 1 through stage 4 chronic kidney disease, or unspecified chronic kidney disease: Secondary | ICD-10-CM | POA: Insufficient documentation

## 2020-06-03 HISTORY — DX: Heart failure, unspecified: I50.9

## 2020-06-03 LAB — CBC
HCT: 38.1 % — ABNORMAL LOW (ref 39.0–52.0)
Hemoglobin: 11.9 g/dL — ABNORMAL LOW (ref 13.0–17.0)
MCH: 29.9 pg (ref 26.0–34.0)
MCHC: 31.2 g/dL (ref 30.0–36.0)
MCV: 95.7 fL (ref 80.0–100.0)
Platelets: 243 10*3/uL (ref 150–400)
RBC: 3.98 MIL/uL — ABNORMAL LOW (ref 4.22–5.81)
RDW: 13.5 % (ref 11.5–15.5)
WBC: 8.6 10*3/uL (ref 4.0–10.5)
nRBC: 0 % (ref 0.0–0.2)

## 2020-06-03 LAB — BASIC METABOLIC PANEL
Anion gap: 5 (ref 5–15)
BUN: 19 mg/dL (ref 8–23)
CO2: 28 mmol/L (ref 22–32)
Calcium: 8.9 mg/dL (ref 8.9–10.3)
Chloride: 103 mmol/L (ref 98–111)
Creatinine, Ser: 1.11 mg/dL (ref 0.61–1.24)
GFR, Estimated: >60 mL/min (ref >60)
Glucose, Bld: 110 mg/dL — ABNORMAL HIGH (ref 70–99)
Potassium: 4.2 mmol/L (ref 3.5–5.1)
Sodium: 136 mmol/L (ref 135–145)

## 2020-06-03 LAB — MAGNESIUM: Magnesium: 2 mg/dL (ref 1.7–2.4)

## 2020-06-03 MED ORDER — METOPROLOL TARTRATE 25 MG PO TABS: 25.0000 mg | ORAL_TABLET | Freq: Every day | ORAL | 5 refills | Status: DC | PRN

## 2020-06-03 NOTE — Patient Instructions (Addendum)
EKG done today.   Labs done today. We will contact you only if your labs are abnormal.  START Metoprolol 25mg  (1 tablet) by mouth as needed for a heart rate greater than 100  Eliquis samples were provided to you today during your office visit.   No other medication changes were made. Please continue all current medications as prescribed.  Your physician recommends that you schedule a follow-up appointment in: 4 months with Dr. Aundra Dubin   If you have any questions or concerns before your next appointment please send [place] a message through [facility] or call our office at [phone].    TO LEAVE A MESSAGE FOR THE NURSE SELECT OPTION 2, PLEASE LEAVE A MESSAGE INCLUDING: . YOUR NAME . DATE OF BIRTH . CALL BACK NUMBER . REASON FOR CALL**this is important as we prioritize the call backs  YOU WILL RECEIVE A CALL BACK THE SAME DAY AS LONG AS YOU CALL BEFORE 4:00 PM   Do the following things EVERYDAY: 1) Weigh yourself in the morning before breakfast. Write it down and keep it in a log. 2) Take your medicines as prescribed 3) Eat low salt foods--Limit salt (sodium) to 2000 mg per day.  4) Stay as active as you can everyday 5) Limit all fluids for the day to less than 2 liters   At the [facility], you and your health needs are our priority. As part of our continuing mission to provide you with exceptional heart care, we have created designated Provider Care Teams. These Care Teams include your primary Cardiologist (physician) and Advanced Practice Providers (APPs- Physician Assistants and Nurse Practitioners) who all work together to provide you with the care you need, when you need it.   You may see any of the following providers on your designated Care Team at your next follow up: Marland Kitchen Dr Glori Bickers . Dr Loralie Champagne . Darrick Grinder, NP . Lyda Jester, PA . Audry Riles, PharmD   Please be sure to bring in all your medications bottles to every appointment.

## 2020-06-03 NOTE — Progress Notes (Signed)
Medication Samples have been provided to the patient.  Drug name: Eliquis       Strength: 5mg         Qty:4  LOT: [id number]  Exp.Date: 05/2022  Dosing instructions: 1 tablet by mouth 2 times daily  The patient has been instructed regarding the correct time, dose, and frequency of taking this medication, including desired effects and most common side effects.   Elenie Coven R Gracious Renken 94:70 AM 06/03/2020

## 2020-06-04 NOTE — Progress Notes (Signed)
Patient ID: [name] Spence, MD, male   DOB: 03/30/1929, 87 y.o.   MRN: [id number] PCP: [name], MD Cardiology: Dr. 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  He had an RV infarct with chronic total occlusion of a small nondominant RCA in the 1980s.  He had a drug-eluting stent to the CFX in 11/10. He is very symptomatic with atrial fibrillation episodes. He has had a chronic pattern of mild shortness of breath and mild left shoulder pain/chest aching when walking up a hill (x 20 years).   He can walk 3 miles on flat ground without problems.  He golfs 3-4 times a week.  He walks for 20-30 minutes on a treadmill several times a week.   Echo in 7/16 showed normal LV systolic function, EF 40-98% with moderate diastolic dysfunction.  Moderate TR with PA systolic pressure 65 mmHg.  Normal RV size and systolic function.  Given volume overload on exam and abnormal echo, he was started on low dose Lasix in the past.    He was started on Multaq but continued to have episodes of atrial fibrillation.  This was stopped, and he was admitted for Tikosyn loading.  After he went home, he developed left facial droop and left arm weakness.  He was found to have a small right parietal cortical infarct with hemorrhagic conversion.  His symptoms resolved quickly. INR was subtherapeutic at admission.  Warfarin was stopped and he was put on apixaban.   In 9/17, he was admitted with LLL PNA.  During the admission, he had a couple of relatively short runs of atrial fibrillation.  He developed more frequent episodes of atrial fibrillation at home and was started on Tikosyn.   In 5/19, he had bilateral orchiectomy for metastatic prostate cancer.  His PSA has come down considerably.   In 1/20, he had influenza that progressed to PNA.  He was hospitalized with transient atrial fibrillation.    In 11/20, he was  admitted with an E coli UTI and urosepsis with hypotension and AKI.  He was initially fluid-resuscitated and later required diuresis.  He was in atrial fibrillation while in the hospital.   He has been stable recently.  Feels like he has atrial fibrillation symptoms about every 3-4 weeks for a day or less.  No change to this pattern.  He is fatigued and orthostatic when he feels like he is in atrial fibrillation.  He has no chest pain.  He does not take Lasix.  Mild lightheadedness normally if he stands up too fast.  No dyspnea walking on flat ground, gets short of breath with walking up hills (noted recently at [facility]).  Still plays golf twice a week. He does get asthma symptoms with wheezing this time of year, attributes to allergies.   ECG (personally reviewed): NSR, QTc 464 msec   Labs (6/11): LDL 63, HDL 35, K 5.5, creatinine 1.1 Labs (6/12): LDL 58, HDL 53, TSH normal, K 4.7, creatinine 1 Labs (12/12): BNP 79 Labs (7/14): K 5, creatinine 1.1, LDL 48, HDL 34 Labs (8/15): LDL 48, HDL 33, TSH normal, K 5.4, creatinine 1.2 Labs (6/16): K 5.1, creatinine 1.27, HCT 42.5 Labs (10/16): LDL 37, HDL 32 Labs (11/16): K 4.4, creatinine 1.36, BNP 103 Labs  (2/17): K 4.5, creatinine 1.38, Mg 2.0, LDL 32 Labs (3/17): HCT 41.7 Labs (5/17): K 4.2, creatinine 1.17, Mg 2.1  Labs (9/17): K 4.2, creatinine 1.16, HCT 37.3 Labs (11/17): hgb 13.3, LDL 40 Labs (5/18): K 4.5, creatinine 1.25 Labs (5/19): K 5.5, creatinine 1.16 Labs (2/20): K 4, creatinine 1.24, hgb 12.2 Labs (11/20): K 3.4, creatinine 1.34, hgb 10.3, LDL 49 Labs (12/20): K 4.5, creatinine 1.2 Labs (2/21): K 3.9, creatinine 1.5, TSH 4.5 (mildly elevated) Labs (5/21): TSH normal, K 5, creatinine 1.27 Labs (9/21): K 4.1, creatinine 1.22, LDL 40, TSH normal  PMH: 1. CAD: RV infarct in the 1980s.  Chronic total occlusion of a small nondominant RCA.  11/10 PCI to CFX with Xience DES.  EF 60% on LV-gram at that time. ETT (11/14) with 9'07"  exercise, no significant ST depression.  2.  Paroxysmal atrial fibrillation: Multaq was ineffective. Now on Tikosyn.  3.  Hyperlipidemia 4. GERD 5. HTN 6. Asthma 7. Chronic diastolic CHF: Echo (5/95): EF 55-60%, mild LV hypertrophy, no regional WMAs, mild AI, mild MR, mild-moderate biatrial enlargement, PA systolic pressure 51 mmHg. Echo (7/16) with EF 60-65%, moderate diastolic dysfunction, normal RV size and systolic function, mild-moderate MR, moderate TR, moderate biatrial enlargement, PA systolic pressure 65 mmHg.  - Echo (5/18): EF 55-60%, mild AI, mild MR, PASP 60 mmHg, normal RV size and systolic function.  - Echo (8/19): EF 60-65%, normal RV size and systolic function, severe TR with PASP 60 mmHg.  8. CKD 9. CVA: Right parietal cortical CVA with peri-infarct hemorrhagic conversion.  CTA showed right M2 branch flow gap.   10. Prostate cancer: Metastatic.  - Bilateral orchiectomy in 5/19.   SH: Nonsmoker.  Retired Stage manager, lives in [place].  2 daughters.  Originally from [place].   FH: Brother with CABG.   ROS: All systems reviewed and negative except as per HPI.    Current Outpatient Medications  Medication Sig Dispense Refill  . albuterol (VENTOLIN HFA) 108 (90 Base) MCG/ACT inhaler Inhale 2 puffs into the lungs every 4 (four) hours as needed for wheezing or shortness of breath. 18 g 2  . apixaban (ELIQUIS) 5 MG TABS tablet Take 1 tablet (5 mg total) by mouth 2 (two) times daily. 180 tablet 3  . atorvastatin (LIPITOR) 20 MG tablet TAKE 1 TABLET(20 MG) BY MOUTH DAILY 90 tablet 1  . buPROPion (WELLBUTRIN SR) 150 MG 12 hr tablet TAKE 1 TABLET(150 MG) BY MOUTH DAILY 90 tablet 3  . cetirizine (ZYRTEC) 10 MG tablet Take 10 mg by mouth daily with supper.     . Cholecalciferol (VITAMIN D) 2000 units tablet Take 2,000 Units by mouth daily.    Marland Kitchen dofetilide (TIKOSYN) 250 MCG capsule Take 1 capsule (250 mcg total) by mouth 2 (two) times daily. Needs appt 180 capsule 0  .  fluticasone (FLONASE) 50 MCG/ACT nasal spray SHAKE LIQUID AND USE 2 SPRAYS IN EACH NOSTRIL DAILY 48 g 3  . Fluticasone Furoate (ARNUITY ELLIPTA) 200 MCG/ACT AEPB Inhale 1 puff into the lungs daily. 2 each 5  . LORazepam (ATIVAN) 1 MG tablet TAKE 1/2 TO 1 TABLET BY MOUTH AT BEDTIME AS NEEDED 90 tablet 0  . metoprolol tartrate (LOPRESSOR) 25 MG tablet Take 1 tablet (25 mg total) by mouth daily as needed (for a heart rate greater than 100). 30 tablet 5  . nitroGLYCERIN (NITROSTAT) 0.4 MG SL tablet Place 1 tablet (0.4 mg total) under the tongue every 5 (five) minutes x 3 doses as needed for chest pain. 25 tablet 2  . omeprazole (PRILOSEC OTC) 20 MG tablet Take 1 tablet (20 mg total) by mouth  daily. 100 tablet 4  . tamsulosin (FLOMAX) 0.4 MG CAPS capsule TAKE 1 CAPSULE(0.4 MG) BY MOUTH DAILY 90 capsule 1  . FLUZONE HIGH-DOSE QUADRIVALENT 0.7 ML SUSY      No current facility-administered medications for this encounter.    BP 140/80   Pulse 73   Wt 69 kg (152 lb 3.2 oz)   SpO2 99%   BMI 22.15 kg/m  General: NAD Neck: No JVD, no thyromegaly or thyroid nodule.  Lungs: Upper airways-type wheezes CV: Nondisplaced PMI.  Heart regular S1/S2, no S3/S4, no murmur.  No peripheral edema.  No carotid bruit.  Normal pedal pulses.  Abdomen: Soft, nontender, no hepatosplenomegaly, no distention.  Skin: Intact without lesions or rashes.  Neurologic: Alert and oriented x 3.  Psych: Normal affect. Extremities: No clubbing or cyanosis.  HEENT: Normal.    Assessment/Plan: 1. CAD: No chest pain.    - He is on apixaban without ASA given stable CAD.   - Continue statin 2. Hyperlipidemia: Good lipids in 9/21.  3. Chronic diastolic CHF:  Most recent echo in 8/19 showed EF 03-00% but PA systolic pressure was estimated at 60 mmHg and there was severe TR (progressed from moderate), the RV looked normal.  I suspect that he has a degree of RV failure accounting for NYHA class II symptoms.  He is not taking Lasix now  and weight is stable.  He is not volume overloaded on exam.  - He wants to hold off on repeat echo.  Given age, think this is reasonable as unlikely to change management much.   - He can stay off Lasix for now.  4. Atrial fibrillation: Paroxysmal.  Episodes are quite symptomatic and usually triggered by some form of physical stress (infection typically), he feels "bad" with atrial fibrillation even when he's rate-controlled. He had frequent breakthrough on Multaq but and is now on Tikosyn.  Has an episode that feels like AF every 3-4 weeks, chronic pattern that has not changed.  QTc ok on today's echo.  He is now off diltiazem CD and atenolol due to low BP and orthostasis. - Can take metoprolol 25 mg prn tachypalpitations.  - Continue Tikosyn.  BMET/Mg today.  - If he develops more frequent breakthrough afib on Tikosyn, amiodarone would be an option.  - Continue Eliquis. CBC today.  5. Tricuspid regurgitation: Severe on 8/19 echo, but I do not hear a significant murmur today.    Followup in 4 months.     Loralie Champagne 06/04/2020

## 2020-06-06 DIAGNOSIS — C61 Malignant neoplasm of prostate: Secondary | ICD-10-CM | POA: Diagnosis not present

## 2020-06-13 DIAGNOSIS — R351 Nocturia: Secondary | ICD-10-CM | POA: Diagnosis not present

## 2020-06-13 DIAGNOSIS — C61 Malignant neoplasm of prostate: Secondary | ICD-10-CM | POA: Diagnosis not present

## 2020-06-13 DIAGNOSIS — N401 Enlarged prostate with lower urinary tract symptoms: Secondary | ICD-10-CM | POA: Diagnosis not present

## 2020-06-20 DIAGNOSIS — L821 Other seborrheic keratosis: Secondary | ICD-10-CM | POA: Diagnosis not present

## 2020-06-20 DIAGNOSIS — D692 Other nonthrombocytopenic purpura: Secondary | ICD-10-CM | POA: Diagnosis not present

## 2020-06-20 DIAGNOSIS — Z85828 Personal history of other malignant neoplasm of skin: Secondary | ICD-10-CM | POA: Diagnosis not present

## 2020-06-20 DIAGNOSIS — D1801 Hemangioma of skin and subcutaneous tissue: Secondary | ICD-10-CM | POA: Diagnosis not present

## 2020-07-06 IMAGING — DX DG CHEST 2V
3 series · 3 of 3 positions shown · non-contrast
Comparison: April 02, 2016

CLINICAL DATA: Shortness of breath

EXAM:
CHEST - 2 VIEW

[chest pa]
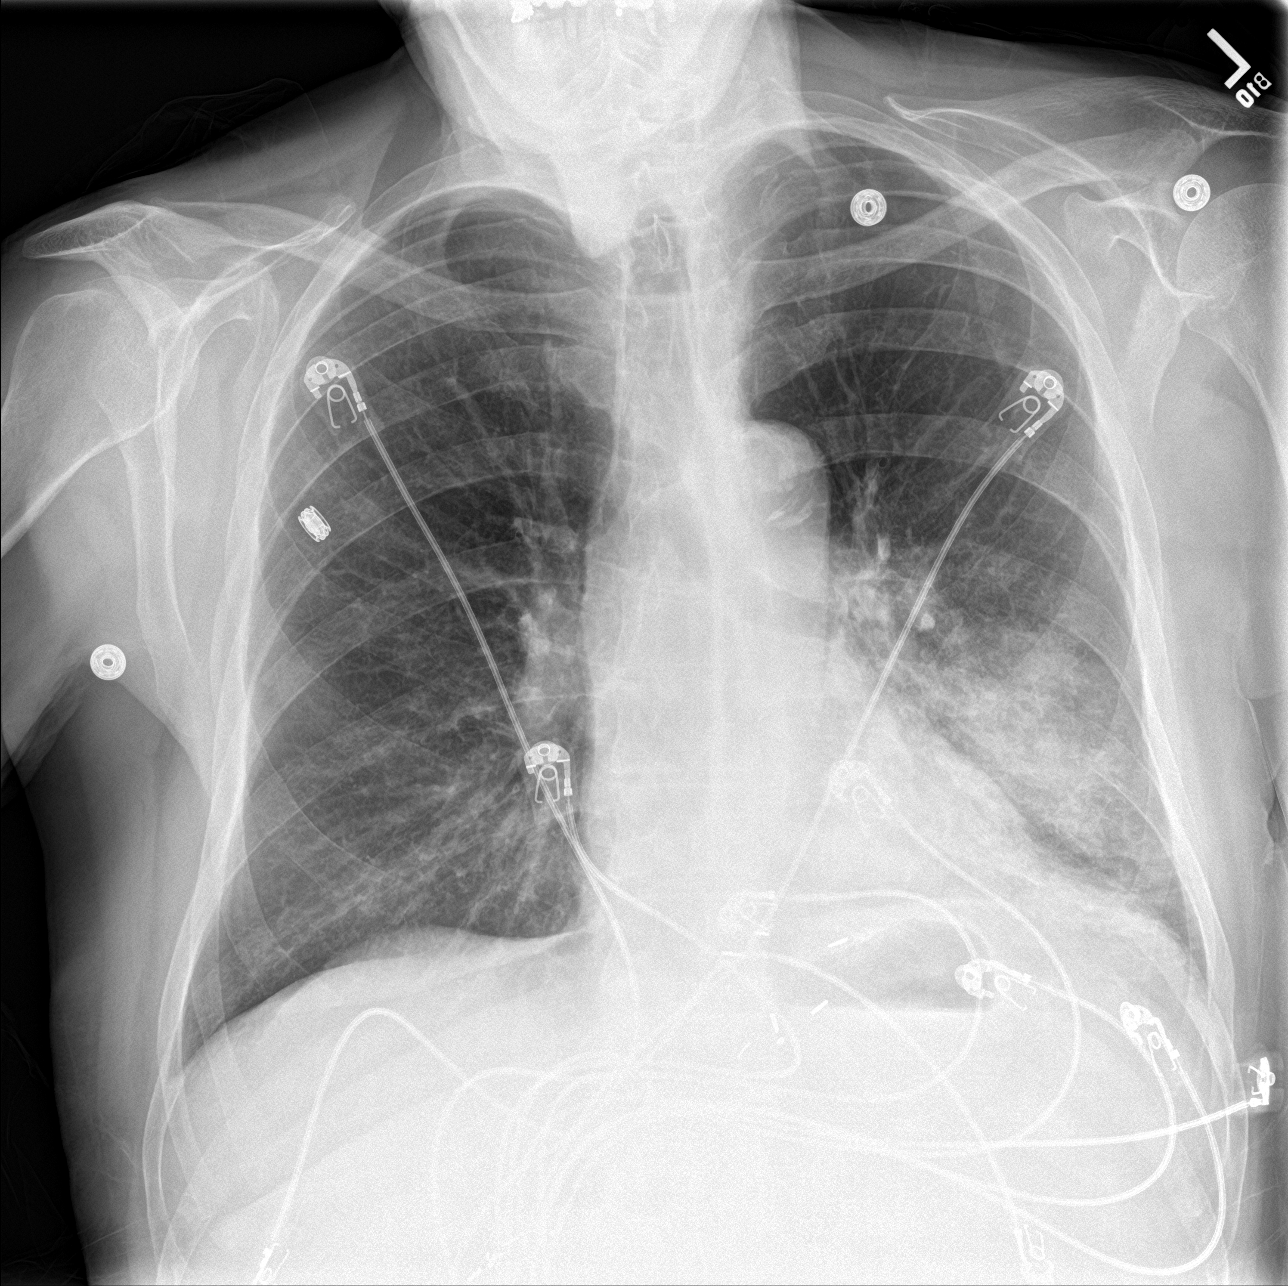

[chest lat (1 of 2)]
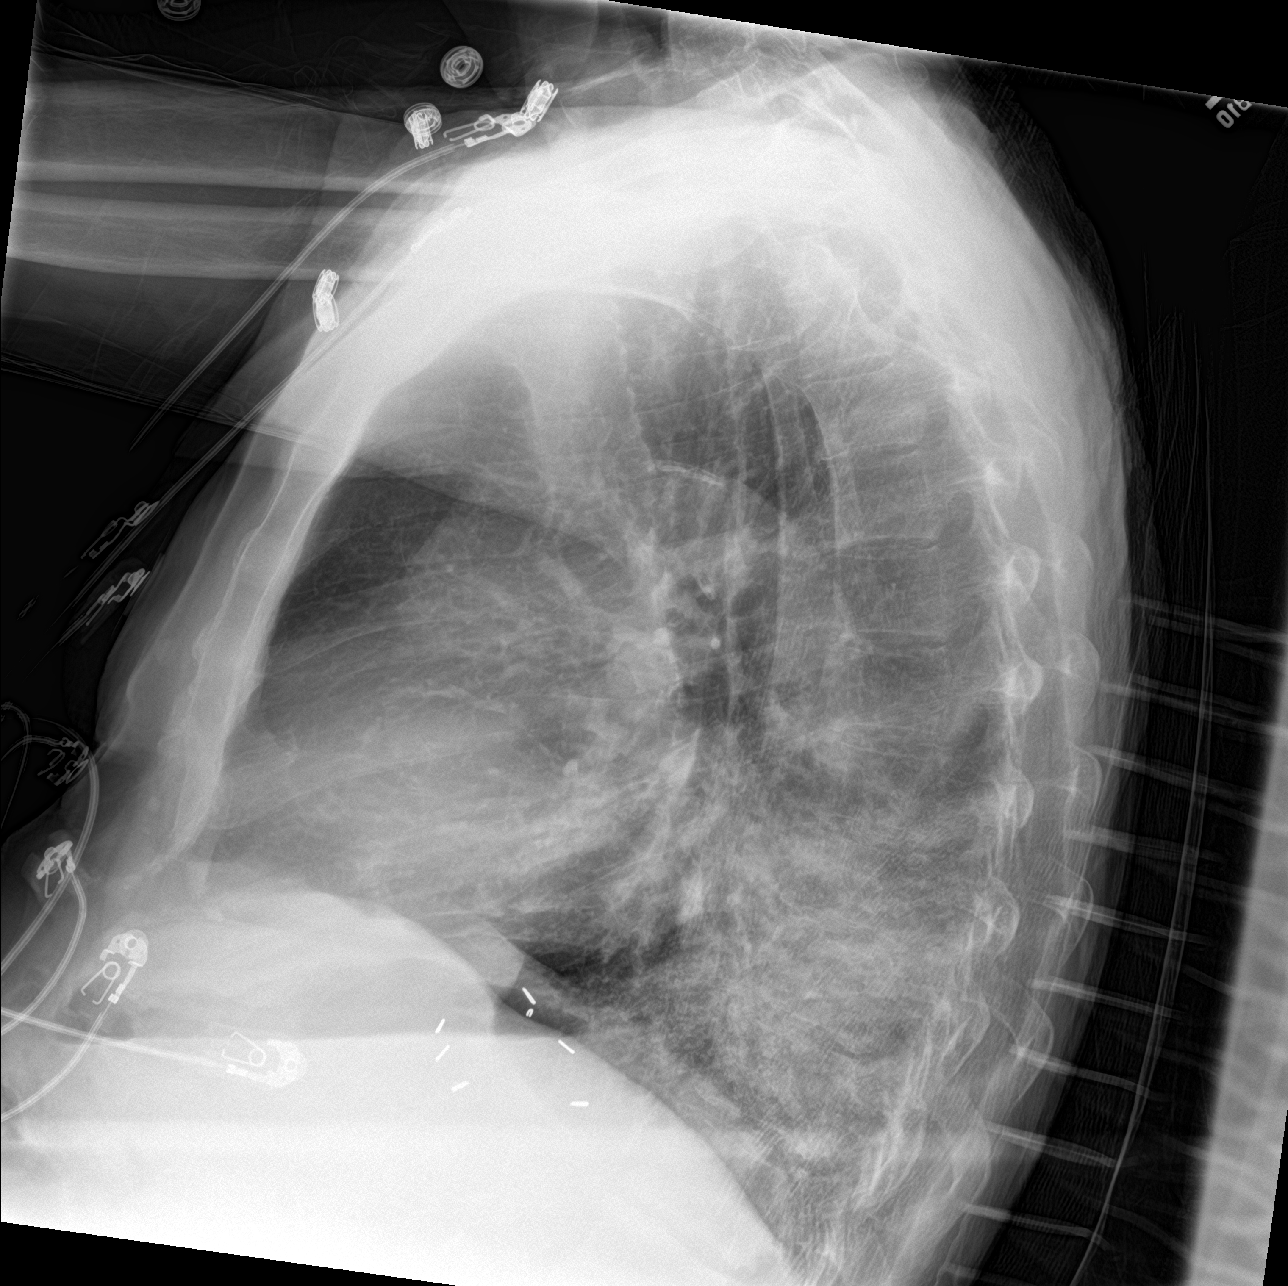

[chest lat (2 of 2)]
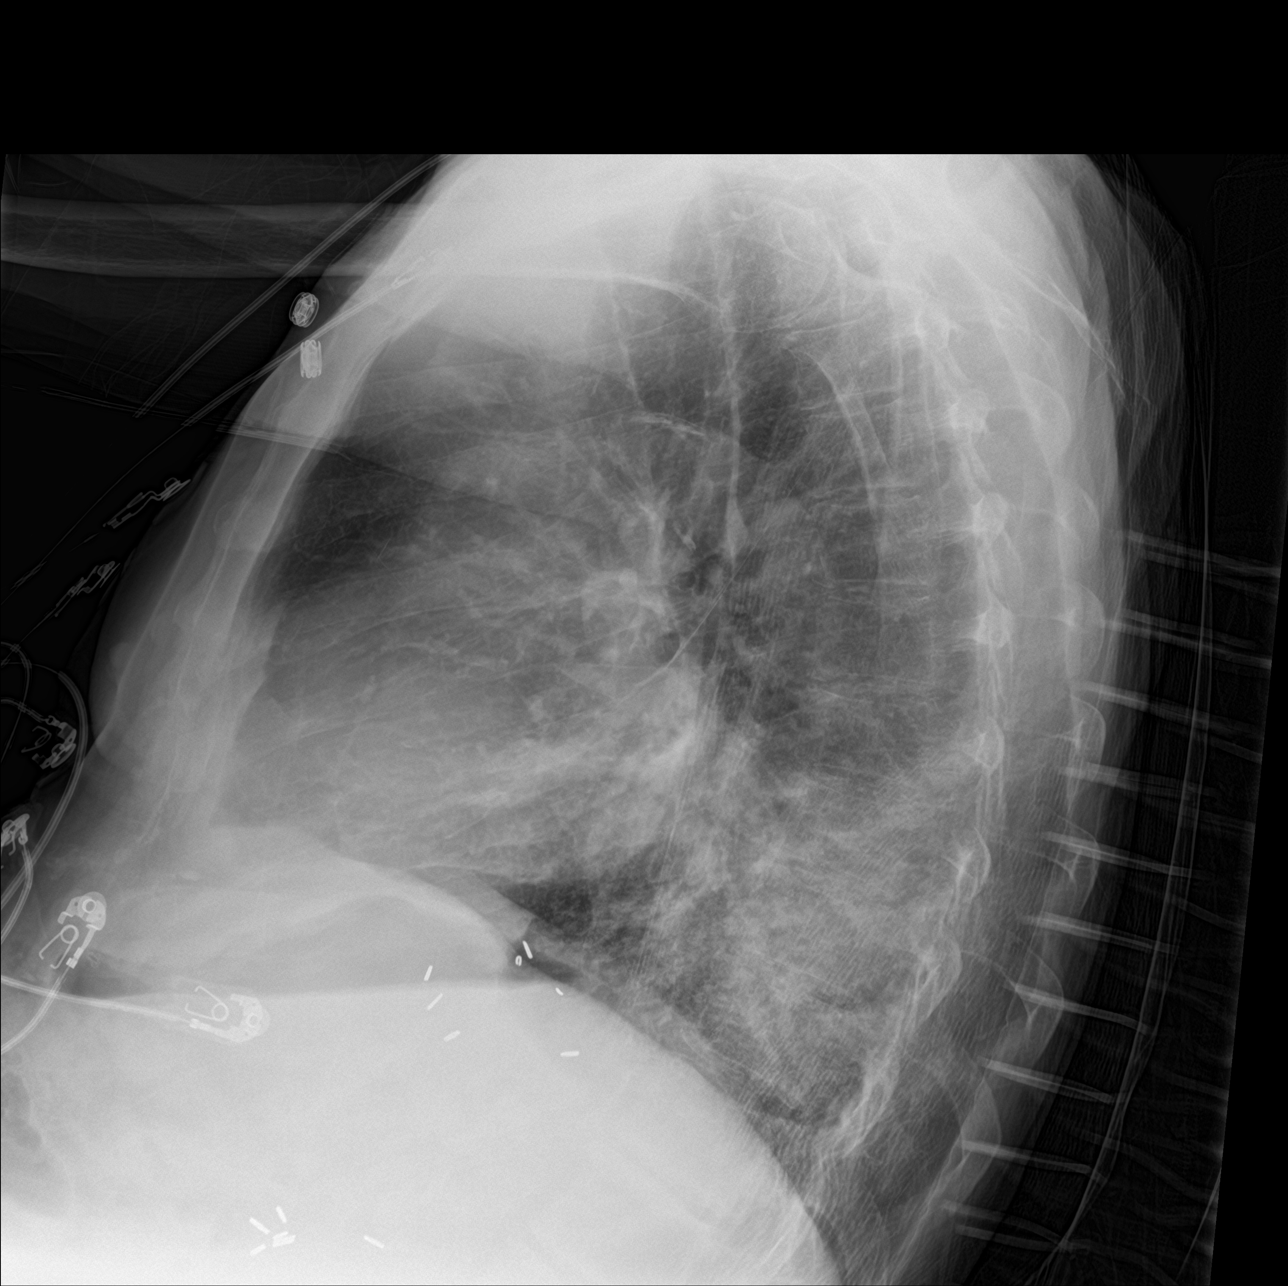

[3 of 3 positions shown; findings below may reference images not displayed]

FINDINGS: There is extensive airspace consolidation throughout much of the
left lower lobe. Lungs elsewhere are clear. Heart size and pulmonary
vascularity are normal. No adenopathy. There is aortic
atherosclerosis. No evident bone lesions. There are surgical clips
at the gastroesophageal junction.
IMPRESSION: Extensive left lower lobe pneumonia. Lungs elsewhere clear. Heart
size normal. There is aortic atherosclerosis.

Aortic Atherosclerosis ([id number]).

Followup PA and lateral chest radiographs recommended in 3-4 weeks
following trial of antibiotic therapy to ensure resolution and
exclude underlying malignancy.

## 2020-07-16 ENCOUNTER — Other Ambulatory Visit: Payer: Self-pay | Admitting: Family Medicine

## 2020-07-16 NOTE — Telephone Encounter (Signed)
Last filled 04/24/2020 Last OV 01/23/2019  Ok to fill?

## 2020-08-04 ENCOUNTER — Other Ambulatory Visit (HOSPITAL_COMMUNITY): Payer: Self-pay | Admitting: Cardiology

## 2020-08-06 ENCOUNTER — Other Ambulatory Visit (HOSPITAL_COMMUNITY): Payer: Self-pay | Admitting: *Deleted

## 2020-08-06 MED ORDER — DOFETILIDE 250 MCG PO CAPS: ORAL_CAPSULE | ORAL | 0 refills | Status: DC

## 2020-08-14 ENCOUNTER — Other Ambulatory Visit: Payer: Self-pay | Admitting: Family Medicine

## 2020-08-22 ENCOUNTER — Telehealth (HOSPITAL_COMMUNITY): Payer: Self-pay | Admitting: *Deleted

## 2020-08-22 NOTE — Telephone Encounter (Signed)
Called pts home and mobile number to schedule sooner f/u with Dr.McLean. no answer left vm for pt to return call

## 2020-09-02 ENCOUNTER — Other Ambulatory Visit: Payer: Self-pay | Admitting: Internal Medicine

## 2020-09-11 ENCOUNTER — Other Ambulatory Visit: Payer: Self-pay | Admitting: Internal Medicine

## 2020-09-30 ENCOUNTER — Other Ambulatory Visit: Payer: Self-pay | Admitting: Internal Medicine

## 2020-10-03 ENCOUNTER — Other Ambulatory Visit: Payer: Self-pay

## 2020-10-03 ENCOUNTER — Encounter (HOSPITAL_COMMUNITY): Payer: Self-pay | Admitting: Cardiology

## 2020-10-03 ENCOUNTER — Ambulatory Visit (HOSPITAL_COMMUNITY)
Admission: RE | Admit: 2020-10-03 | Discharge: 2020-10-03 | Disposition: A | Discharge status: Home / Self Care | Payer: PPO | Source: Ambulatory Visit | Attending: Cardiology | Admitting: Cardiology

## 2020-10-03 VITALS — BP 110/60 | HR 69 | Wt 154.8 lb

## 2020-10-03 DIAGNOSIS — Z8249 Family history of ischemic heart disease and other diseases of the circulatory system: Secondary | ICD-10-CM | POA: Insufficient documentation

## 2020-10-03 DIAGNOSIS — I5032 Chronic diastolic (congestive) heart failure: Secondary | ICD-10-CM | POA: Insufficient documentation

## 2020-10-03 DIAGNOSIS — Z7901 Long term (current) use of anticoagulants: Secondary | ICD-10-CM | POA: Insufficient documentation

## 2020-10-03 DIAGNOSIS — E785 Hyperlipidemia, unspecified: Secondary | ICD-10-CM | POA: Diagnosis not present

## 2020-10-03 DIAGNOSIS — N189 Chronic kidney disease, unspecified: Secondary | ICD-10-CM | POA: Diagnosis not present

## 2020-10-03 DIAGNOSIS — E7800 Pure hypercholesterolemia, unspecified: Secondary | ICD-10-CM | POA: Insufficient documentation

## 2020-10-03 DIAGNOSIS — Z79899 Other long term (current) drug therapy: Secondary | ICD-10-CM | POA: Diagnosis not present

## 2020-10-03 DIAGNOSIS — I2510 Atherosclerotic heart disease of native coronary artery without angina pectoris: Secondary | ICD-10-CM | POA: Insufficient documentation

## 2020-10-03 DIAGNOSIS — R011 Cardiac murmur, unspecified: Secondary | ICD-10-CM | POA: Diagnosis not present

## 2020-10-03 DIAGNOSIS — I130 Hypertensive heart and chronic kidney disease with heart failure and stage 1 through stage 4 chronic kidney disease, or unspecified chronic kidney disease: Secondary | ICD-10-CM | POA: Insufficient documentation

## 2020-10-03 DIAGNOSIS — I480 Paroxysmal atrial fibrillation: Secondary | ICD-10-CM | POA: Insufficient documentation

## 2020-10-03 DIAGNOSIS — J45909 Unspecified asthma, uncomplicated: Secondary | ICD-10-CM | POA: Diagnosis not present

## 2020-10-03 LAB — CBC
HCT: 38.9 % — ABNORMAL LOW (ref 39.0–52.0)
Hemoglobin: 12 g/dL — ABNORMAL LOW (ref 13.0–17.0)
MCH: 29.8 pg (ref 26.0–34.0)
MCHC: 30.8 g/dL (ref 30.0–36.0)
MCV: 96.5 fL (ref 80.0–100.0)
Platelets: 220 10*3/uL (ref 150–400)
RBC: 4.03 MIL/uL — ABNORMAL LOW (ref 4.22–5.81)
RDW: 13.9 % (ref 11.5–15.5)
WBC: 9.2 10*3/uL (ref 4.0–10.5)
nRBC: 0 % (ref 0.0–0.2)

## 2020-10-03 LAB — BASIC METABOLIC PANEL
Anion gap: 5 (ref 5–15)
BUN: 29 mg/dL — ABNORMAL HIGH (ref 8–23)
CO2: 25 mmol/L (ref 22–32)
Calcium: 9.1 mg/dL (ref 8.9–10.3)
Chloride: 109 mmol/L (ref 98–111)
Creatinine, Ser: 1.3 mg/dL — ABNORMAL HIGH (ref 0.61–1.24)
GFR, Estimated: 53 mL/min — ABNORMAL LOW (ref >60)
Glucose, Bld: 100 mg/dL — ABNORMAL HIGH (ref 70–99)
Potassium: 4.8 mmol/L (ref 3.5–5.1)
Sodium: 139 mmol/L (ref 135–145)

## 2020-10-03 LAB — LIPID PANEL
Cholesterol: 85 mg/dL (ref 0–200)
HDL: 38 mg/dL — ABNORMAL LOW (ref >40)
LDL Cholesterol: 36 mg/dL (ref 0–99)
Total CHOL/HDL Ratio: 2.2 RATIO
Triglycerides: 53 mg/dL (ref <150)
VLDL: 11 mg/dL (ref 0–40)

## 2020-10-03 LAB — MAGNESIUM: Magnesium: 2 mg/dL (ref 1.7–2.4)

## 2020-10-03 NOTE — Patient Instructions (Addendum)
EKG done today.  Labs done today. We will contact you only if your labs are abnormal.  No medication changes were made. Please continue all current medications as prescribed.  Your physician recommends that you schedule a follow-up appointment in: 3 months  If you have any questions or concerns before your next appointment please send us a message through mychart or call our office at [phone].    TO LEAVE A MESSAGE FOR THE NURSE SELECT OPTION 2, PLEASE LEAVE A MESSAGE INCLUDING: YOUR NAME DATE OF BIRTH CALL BACK NUMBER REASON FOR CALL**this is important as we prioritize the call backs  YOU WILL RECEIVE A CALL BACK THE SAME DAY AS LONG AS YOU CALL BEFORE 4:00 PM   Do the following things EVERYDAY: Weigh yourself in the morning before breakfast. Write it down and keep it in a log. Take your medicines as prescribed Eat low salt foods--Limit salt (sodium) to 2000 mg per day.  Stay as active as you can everyday Limit all fluids for the day to less than 2 liters   At the [facility], you and your health needs are our priority. As part of our continuing mission to provide you with exceptional heart care, we have created designated Provider Care Teams. These Care Teams include your primary Cardiologist (physician) and Advanced Practice Providers (APPs- Physician Assistants and Nurse Practitioners) who all work together to provide you with the care you need, when you need it.   You may see any of the following providers on your designated Care Team at your next follow up: Dr Daniel Bensimhon Dr Dalton McLean Amy Clegg, NP Brittainy Simmons, PA Lauren Kemp, PharmD   Please be sure to bring in all your medications bottles to every appointment.   

## 2020-10-04 NOTE — Progress Notes (Signed)
Patient ID: [name] Spence, MD, male   DOB: 15-Apr-1929, 87 y.o.   MRN: [id number] PCP: [name], MD Cardiology: Dr. 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  He had an RV infarct with chronic total occlusion of a small nondominant RCA in the 1980s.  He had a drug-eluting stent to the CFX in 11/10. He is very symptomatic with atrial fibrillation episodes. He has had a chronic pattern of mild shortness of breath and mild left shoulder pain/chest aching when walking up a hill (x 20 years).   He can walk 3 miles on flat ground without problems.  He golfs 3-4 times a week.  He walks for 20-30 minutes on a treadmill several times a week.   Echo in 7/16 showed normal LV systolic function, EF 123456 with moderate diastolic dysfunction.  Moderate TR with PA systolic pressure 65 mmHg.  Normal RV size and systolic function.  Given volume overload on exam and abnormal echo, he was started on low dose Lasix in the past.    He was started on Multaq but continued to have episodes of atrial fibrillation.  This was stopped, and he was admitted for Tikosyn loading.  After he went home, he developed left facial droop and left arm weakness.  He was found to have a small right parietal cortical infarct with hemorrhagic conversion.  His symptoms resolved quickly. INR was subtherapeutic at admission.  Warfarin was stopped and he was put on apixaban.   In 9/17, he was admitted with LLL PNA.  During the admission, he had a couple of relatively short runs of atrial fibrillation.  He developed more frequent episodes of atrial fibrillation at home and was started on Tikosyn.   In 5/19, he had bilateral orchiectomy for metastatic prostate cancer.  His PSA has come down considerably.   In 1/20, he had influenza that progressed to PNA.  He was hospitalized with transient atrial fibrillation.    In 11/20, he was  admitted with an E coli UTI and urosepsis with hypotension and AKI.  He was initially fluid-resuscitated and later required diuresis.  He was in atrial fibrillation while in the hospital.   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  He was in atrial fibrillation frequently in June, but less frequently in July and only a couple of times for a few hours in August.  Recently, he has been feeling good.  He can climb 2 flights of stairs with mild dyspnea at the top.  He works out with a Physiological scientist two times/week. He is also walking for exercise and golfs 9 holes twice a week.  He has cut out caffeine and ETOH. He feels more tired when he is in atrial fibrillation.  No lightheadedness.   ECG (personally reviewed): NSR, QTc 475 msec   Labs (6/11): LDL 63, HDL 35, K 5.5, creatinine 1.1 Labs (6/12): LDL 58, HDL 53, TSH normal, K 4.7, creatinine 1 Labs (12/12): BNP 79 Labs (7/14): K 5, creatinine 1.1, LDL 48, HDL 34 Labs (8/15): LDL 48, HDL 33, TSH normal, K 5.4, creatinine 1.2 Labs (6/16): K 5.1, creatinine 1.27, HCT 42.5 Labs (10/16): LDL 37, HDL 32 Labs (11/16): K 4.4, creatinine 1.36, BNP 103 Labs  (2/17): K 4.5, creatinine 1.38, Mg 2.0, LDL 32 Labs (3/17): HCT 41.7 Labs (5/17): K 4.2, creatinine 1.17, Mg 2.1 Labs (9/17):  K 4.2, creatinine 1.16, HCT 37.3 Labs (11/17): hgb 13.3, LDL 40 Labs (5/18): K 4.5, creatinine 1.25 Labs (5/19): K 5.5, creatinine 1.16 Labs (2/20): K 4, creatinine 1.24, hgb 12.2 Labs (11/20): K 3.4, creatinine 1.34, hgb 10.3, LDL 49 Labs (12/20): K 4.5, creatinine 1.2 Labs (2/21): K 3.9, creatinine 1.5, TSH 4.5 (mildly elevated) Labs (5/21): TSH normal, K 5, creatinine 1.27 Labs (9/21): K 4.1, creatinine 1.22, LDL 40, TSH normal Labs (4/22): K 4.2, creatinine 1.11  PMH: 1. CAD: RV infarct in the 1980s.  Chronic total occlusion of a small nondominant RCA.  11/10 PCI to CFX with Xience DES.  EF 60% on LV-gram at that time. ETT (11/14)  with 9'07" exercise, no significant ST depression.  2.  Paroxysmal atrial fibrillation: Multaq was ineffective. Now on Tikosyn.  3.  Hyperlipidemia 4. GERD 5. HTN 6. Asthma 7. Chronic diastolic CHF: Echo ([id number]): EF 55-60%, mild LV hypertrophy, no regional WMAs, mild AI, mild MR, mild-moderate biatrial enlargement, PA systolic pressure 51 mmHg. Echo (7/16) with EF 60-65%, moderate diastolic dysfunction, normal RV size and systolic function, mild-moderate MR, moderate TR, moderate biatrial enlargement, PA systolic pressure 65 mmHg.  - Echo (5/18): EF 55-60%, mild AI, mild MR, PASP 60 mmHg, normal RV size and systolic function.  - Echo (8/19): EF 60-65%, normal RV size and systolic function, severe TR with PASP 60 mmHg.  8. CKD 9. CVA: Right parietal cortical CVA with peri-infarct hemorrhagic conversion.  CTA showed right M2 branch flow gap.   10. Prostate cancer: Metastatic.  - Bilateral orchiectomy in 5/19.   SH: Nonsmoker.  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   FH: Brother with CABG.   ROS: All systems reviewed and negative except as per HPI.    Current Outpatient Medications  Medication Sig Dispense Refill   albuterol (VENTOLIN HFA) 108 (90 Base) MCG/ACT inhaler Inhale 2 puffs into the lungs every 4 (four) hours as needed for wheezing or shortness of breath. 18 g 2   apixaban (ELIQUIS) 5 MG TABS tablet Take 1 tablet (5 mg total) by mouth 2 (two) times daily. 180 tablet 3   ARNUITY ELLIPTA 200 MCG/ACT AEPB INHALE 1 PUFF INTO THE LUNGS DAILY 60 each 0   atorvastatin (LIPITOR) 20 MG tablet TAKE 1 TABLET(20 MG) BY MOUTH DAILY 90 tablet 0   buPROPion (WELLBUTRIN SR) 150 MG 12 hr tablet TAKE 1 TABLET(150 MG) BY MOUTH DAILY 90 tablet 3   cetirizine (ZYRTEC) 10 MG tablet Take 10 mg by mouth daily with supper.      Cholecalciferol (VITAMIN D) 2000 units tablet Take 2,000 Units by mouth daily.     dofetilide (TIKOSYN) 250 MCG capsule TAKE 1  CAPSULE(250 MCG) BY MOUTH TWICE DAILY 120 capsule 0   fluticasone (FLONASE) 50 MCG/ACT nasal spray SHAKE LIQUID AND USE 2 SPRAYS IN EACH NOSTRIL DAILY 48 g 3   LORazepam (ATIVAN) 1 MG tablet TAKE 1/2 TO 1 TABLET BY MOUTH AT BEDTIME AS NEEDED 90 tablet 0   metoprolol tartrate (LOPRESSOR) 25 MG tablet Take 1 tablet (25 mg total) by mouth daily as needed (for a heart rate greater than 100). 30 tablet 5   nitroGLYCERIN (NITROSTAT) 0.4 MG SL tablet Place 1 tablet (0.4 mg total) under the tongue every 5 (five) minutes x 3 doses as needed for chest pain. 25 tablet 2   omeprazole (PRILOSEC OTC) 20 MG tablet Take 1 tablet (20 mg total) by mouth daily. 100 tablet 4  tamsulosin (FLOMAX) 0.4 MG CAPS capsule TAKE 1 CAPSULE(0.4 MG) BY MOUTH DAILY 90 capsule 0   No current facility-administered medications for this encounter.    BP 110/60   Pulse 69   Wt 70.2 kg (154 lb 12.8 oz)   SpO2 98%   BMI 22.53 kg/m  General: NAD Neck: No JVD, no thyromegaly or thyroid nodule.  Lungs: Clear to auscultation bilaterally with normal respiratory effort. CV: Nondisplaced PMI.  Heart regular S1/S2, no S3/S4, 2/6 HSM LLSB.  1+ ankle edema.  No carotid bruit.  Normal pedal pulses.  Abdomen: Soft, nontender, no hepatosplenomegaly, no distention.  Skin: Intact without lesions or rashes.  Neurologic: Alert and oriented x 3.  Psych: Normal affect. Extremities: No clubbing or cyanosis.  HEENT: Normal.   Assessment/Plan: 1. CAD: No chest pain.    - He is on apixaban without ASA given stable CAD.   - Continue statin 2. Hyperlipidemia: Check lipids today.  3. Chronic diastolic CHF:  Most recent echo in 8/19 showed EF 123456 but PA systolic pressure was estimated at 60 mmHg and there was severe TR (progressed from moderate), the RV looked normal.  I suspect that he has a degree of RV failure accounting for NYHA class II symptoms.  He is not taking Lasix now and weight is stable.  He is not volume overloaded on exam.  - He  wants to hold off on repeat echo.  Given age, think this is reasonable as unlikely to change management much.   - He can stay off Lasix for now.  4. Atrial fibrillation: Paroxysmal.  Episodes are quite symptomatic and usually triggered by some form of physical stress (infection typically), he feels "bad" with atrial fibrillation even when he's rate-controlled. He had frequent breakthrough on Multaq but and is now on Tikosyn.  QTc ok on today's echo.  He is now off diltiazem CD and atenolol due to low BP and orthostasis.  He has occasional break through atrial fibrillation that lasts for a few hours but finds this tolerable.  - Can take metoprolol 25 mg prn tachypalpitations.  - Continue Tikosyn.  BMET/Mg today.  - If he develops more frequent breakthrough afib on Tikosyn, amiodarone would be an option.  He is not interested today in switching.  - Continue Eliquis. CBC today.  5. Tricuspid regurgitation: Severe on 8/19 echo, he does have a TR murmur.  Not candidate for surgical TVR and not interested in referral to trial for percutaneous repair.   Followup in 3 months.     Loralie Champagne 10/04/2020

## 2020-10-08 DIAGNOSIS — C61 Malignant neoplasm of prostate: Secondary | ICD-10-CM | POA: Diagnosis not present

## 2020-10-17 ENCOUNTER — Other Ambulatory Visit: Payer: Self-pay | Admitting: Family Medicine

## 2020-10-17 DIAGNOSIS — N401 Enlarged prostate with lower urinary tract symptoms: Secondary | ICD-10-CM | POA: Diagnosis not present

## 2020-10-17 DIAGNOSIS — R351 Nocturia: Secondary | ICD-10-CM | POA: Diagnosis not present

## 2020-10-17 DIAGNOSIS — C61 Malignant neoplasm of prostate: Secondary | ICD-10-CM | POA: Diagnosis not present

## 2020-10-17 DIAGNOSIS — Z79899 Other long term (current) drug therapy: Secondary | ICD-10-CM | POA: Diagnosis not present

## 2020-10-17 NOTE — Telephone Encounter (Signed)
Last office visit- 08/15/19 Last refill- 07/17/20--90 tabs no refills  No future office visit has scheduled.   Can this patient receive a refill?   I'll call patient to schedule appointment

## 2020-10-20 ENCOUNTER — Other Ambulatory Visit: Payer: Self-pay | Admitting: Urology

## 2020-10-20 DIAGNOSIS — C61 Malignant neoplasm of prostate: Secondary | ICD-10-CM

## 2020-10-20 DIAGNOSIS — Z79818 Long term (current) use of other agents affecting estrogen receptors and estrogen levels: Secondary | ICD-10-CM

## 2020-11-04 ENCOUNTER — Other Ambulatory Visit: Payer: Self-pay | Admitting: Internal Medicine

## 2020-11-10 ENCOUNTER — Other Ambulatory Visit: Payer: Self-pay | Admitting: Family Medicine

## 2020-11-11 ENCOUNTER — Other Ambulatory Visit: Payer: PPO

## 2020-11-13 ENCOUNTER — Ambulatory Visit
Admission: RE | Admit: 2020-11-13 | Discharge: 2020-11-13 | Disposition: A | Discharge status: Home / Self Care | Payer: PPO | Source: Ambulatory Visit | Attending: Urology | Admitting: Urology

## 2020-11-13 ENCOUNTER — Other Ambulatory Visit: Payer: Self-pay

## 2020-11-13 DIAGNOSIS — Z79818 Long term (current) use of other agents affecting estrogen receptors and estrogen levels: Secondary | ICD-10-CM

## 2020-11-13 DIAGNOSIS — M810 Age-related osteoporosis without current pathological fracture: Secondary | ICD-10-CM | POA: Diagnosis not present

## 2020-11-13 DIAGNOSIS — C61 Malignant neoplasm of prostate: Secondary | ICD-10-CM

## 2020-11-13 DIAGNOSIS — M85852 Other specified disorders of bone density and structure, left thigh: Secondary | ICD-10-CM | POA: Diagnosis not present

## 2020-11-17 ENCOUNTER — Telehealth: Payer: Self-pay | Admitting: Internal Medicine

## 2020-11-17 NOTE — Telephone Encounter (Signed)
  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 Brand Males, M.D., F.C.C.P,  Pulmonary and Critical Care Medicine Staff Physician, [place] Director - Interstitial Lung Disease  Program  Pulmonary [place] at [place], [place], [address]  NPI Number:  NPI #[id number]  Pager: [phone], If no answer  -> Check AMION or Try [phone] Telephone (clinical office): [phone] Telephone (research): [phone]  2:17 PM 11/17/2020     Immunization History  Administered Date(s) Administered   Fluad Quad(high Dose 65+) 10/17/2018   Influenza Split 10/23/2012, 10/02/2015   Influenza Whole 10/27/2007, 11/13/2008, 10/29/2009, 11/09/2010   Influenza, High Dose Seasonal PF 10/28/2016   Influenza,inj,Quad PF,6+ Mos 10/19/2017   Influenza-Unspecified 12/09/2013, 10/28/2014, 11/12/2019   PFIZER(Purple Top)SARS-COV-2 Vaccination 03/19/2019, 04/08/2019, 09/27/2019   Pneumococcal Conjugate-13 09/17/2013   Pneumococcal Polysaccharide-23 08/30/2000, 11/29/2005   Td 06/20/2007, 01/11/2018   Tdap 01/11/2018   Zoster Recombinat (Shingrix) 11/07/2017, 11/22/2017, 01/11/2018   Zoster, Live 06/20/2007

## 2020-11-18 MED ORDER — ARNUITY ELLIPTA 200 MCG/ACT IN AEPB: 1.0000 | INHALATION_SPRAY | Freq: Every day | RESPIRATORY_TRACT | 0 refills | Status: DC

## 2020-11-18 NOTE — Telephone Encounter (Signed)
Pt called back and he is aware of appt and med sent to the pharmacy.

## 2020-11-18 NOTE — Telephone Encounter (Signed)
LM  Appt made for 10/27.    Medication sent to pharmacy.   Will await call back to update patient.

## 2020-12-01 ENCOUNTER — Other Ambulatory Visit (HOSPITAL_COMMUNITY): Payer: Self-pay

## 2020-12-01 MED ORDER — DOFETILIDE 250 MCG PO CAPS: ORAL_CAPSULE | ORAL | 3 refills | Status: DC

## 2020-12-04 ENCOUNTER — Ambulatory Visit: Payer: PPO | Admitting: Internal Medicine

## 2020-12-04 ENCOUNTER — Ambulatory Visit (INDEPENDENT_AMBULATORY_CARE_PROVIDER_SITE_OTHER): Payer: PPO

## 2020-12-04 ENCOUNTER — Other Ambulatory Visit: Payer: Self-pay

## 2020-12-04 ENCOUNTER — Encounter: Payer: Self-pay | Admitting: Internal Medicine

## 2020-12-04 VITALS — BP 102/64 | HR 74 | Ht 69.5 in | Wt 154.4 lb

## 2020-12-04 DIAGNOSIS — J4530 Mild persistent asthma, uncomplicated: Secondary | ICD-10-CM

## 2020-12-04 DIAGNOSIS — J45909 Unspecified asthma, uncomplicated: Secondary | ICD-10-CM | POA: Diagnosis not present

## 2020-12-04 NOTE — Addendum Note (Signed)
Addended by: Lorretta Harp on: 12/04/2020 02:23 PM   Modules accepted: Orders

## 2020-12-04 NOTE — Progress Notes (Signed)
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 Followed in past for asthma w/ bronchitis. LOV around 2008. Medical hx CAD/ MI/CABG, AFib/ warfarin, diastolic dysfunction, GERD He did well for several years with help from Dr Sherren Mocha. Starting about 6 months ago with no obvious trigger, he began having increased wheeze, cough, needing SABA 2-3x/ day and Qvar only intermittently. He has been worse this Spring, blaming pollen. Sputum remains clear. Denies fever, nodes, blood, edema, chest pain. Hx  Intermittent AFib- no pacemaker Had BCG in medical school.         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 Followed in past for asthma w/ bronchitis.  Medical hx CAD/ MI/CABG,P AFib/ warfarin, diastolic dysfunction, GERD. Dr Coralie Keens alerted [place] that pt was having more trouble, so we could work him in today. Follows For: Pt c/o wheezing and SOB, prod cough with thick yellow mucus. States he had asthma flare up last night. Has has had 3 episodes of A-fib in last week, most recent was 06/18/14.  Persistent variable chest rattle has been going on almost since he was last seen here. Using albuterol rescue inhaler once or twice daily. He has caught a cold from his wife and overnight began or persistent wheeze, using rescue inhaler 4 times daily and continuing his Qvar. Transient relief only. Sputum trace yellow. No fever. He has a little left over prednisone but reports that 20 mg of prednisone daily is enough of a stimulant to trigger his atrial fib.  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  Medical hx CAD/ MI/CABG,P AFib/ warfarin, diastolic dysfunction, GERD Follow For: Pt doing well since last visit. Still post nasal drip. Denies wheezing, SOB, Prod cough.  Has not needed  rescue inhaler in months and denies wheeze since a cold in early spring. Using Qvar twice daily and Singulair. Notices some hoarseness, mouth breathing  Told he has some pulmonary hypertension. We discussed possible role of oxygen desaturation. Does not recognize symptoms of sleep apnea. CXR 06/24/14 IMPRESSION: No active cardiopulmonary disease.  Electronically Signed   By: Franchot Gallo M.D.   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 Medical history CAD/MI/CABG, P A. fib/warfarin, diastolic dysfunction, GERD FOLLOWS FOR: Pt states he has had very little SOB or wheezing since last OV. Has had trouble with Afib. He has continued Flovent 110 and pro air rescue inhaler twice daily each. Has not recognized any association of atrial fib or palpitations with use of albuterol. [name] last year with no particular benefit recognized over his current regimen. He asks about persistent hoarseness. Overnight Oximetry 09/30/2014-normal, not qualifying for sleep O2   OV 10/23/2015  Chief Complaint  Patient presents with   Hospitalization Follow-up    Pt recently hospitalized for LLL CAP. Pt c/o prod cough with green, blood streaked mucus.  Pt discharged yesterday.     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 In June 2017 he had worsening asthma symptoms and he saw Dr. Hardie Pulley and allergy. According to his history exhaled nitric oxide at this visit was elevated at 70 ppb. He was then started on inhaled corticosteroid ARNUITY . Marland Kitchen Up until that point he was only taking her steroids as needed. After that he started taking her steroids regular basis. At follow-up his   He maintain himself on the inhaled  steroid. Apparently blood allergy panel was negative. He was then leading his baseline life with control of atrial fibrillation. However on 10/19/2015 he got hospitalized for left lower lobe pneumonia. Personally visualized the chest  x-ray review the records and agree with the findings. It sounds like he had bacterial pneumonia not otherwise specified. He was discharged yesterday. He still on Augmentin. He has postnasal drainage but is not taking any nasal steroids. He has nocturnal acid reflux and is on fish oil. He is feeling back to baseline other than some post pneumonia fatigue. He also feels his atrial fibrillation is somewhat out of control. He has some occasional streaky hemoptysis but this is improving.   OV 12/03/2015  Chief Complaint  Patient presents with   Follow-up    Pt states his SOB is at baseline - doing well. Pt denies cough, CP/tightness, and f/c/s.     Follow-up left lower lobe pneumonia.  He was hospitalized 10/19/2015 for left lower lobe pneumonia. This is clinical follow-up for the same. He no longer has hemoptysis since his last office visit mid September 2017. He continues to feel well. He is asymptomatic without any shortness of breath wheezing or chest tightness or cough. Only she is was nasal drip that bothers him. He tells me that a year ago he had sinus scan and it showed significant sinus congestion. He is followed up with ENT in the past but is not in a while. Inhaled steroids to the nose helping him but not fully controlled this. He says he will follow-up with ENT for the same. But at this point overall he is feeling fine and playing golf. He is compliant with his inhaled corticosteroid ARNUITY. He will need a chest x-ray today. He is up-to-date with his flu shot.   In interim he did see Dr Aundra Dubin for  Afib and notes reviewed   [place] 06/07/2016  Chief Complaint  Patient presents with   Follow-up    Pt states his breathing has been doing well. Pt c/o prod cough with clear mucus in morning - baseline for pt. Pt denies CP/tightness and f/c/s.    Follow-up mild persistent asthma on inhaled corticosteroid ARNUITY: He is currently feeling well. Symptoms are extremely well controlled. He visited  his allergist Dr. Orvil Feil in March 2018 and his nitric oxide was 17 according to his history. There no new issues. He wants samples of his inhaled steroid because he is in the donut hole  Follow-up allergic rhinitis: This continues to bother him. It is not any worse. It is stable and mild. He is on inhaled nasal steroid and inhaled antihistamine. Despite nasal drainage and he coughs early in the.  morning . He is willing to add on nasal saline  Follow-up pneumonia: He had this last year: Follow-up chest x-ray February 2018 showed resolution. We recommended a CT scan chest for follow-up but he opted to have a chest x-ray in instead    COMPARISON:  Chest CT 01/05/2016.  Chest radiographs 12/03/2015. IMPRESSION: 1. Previous bilateral bronchopneumonia appears resolved. No acute cardiopulmonary abnormality. 2.  Calcified aortic atherosclerosis.     Electronically Signed   By: Genevie Ann M.D.   On: 04/02/2016 12:01     OV 12/13/2016  Chief Complaint  Patient presents with   Follow-up    Pt states that he has been doing good; states he has not had any flare-ups with his asthma. Denies an cough, SOB, or CP.    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 Last seen 6 months ago. He continues to do well asthma control questionnaire is 0 out of 5. He does not have any nocturnal symptoms. When he wakes up he does not have any symptoms is not limited in his activities because of asthma. Is not having shortness of breath or wheezing or nocturnal symptoms no albuterol rescue use. He is up-to-date with his flu shot he continues to play golf. He is okay spacing out his follow-ups for every 9 months   OV 09/28/2018  Subjective:  Patient ID: [name], MD, male , DOB: September 22, 1929 , age 61 y.o. 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 - mild persistsent asthmatic on arnuity. Last seen nov 2018. Then called for refill and asked to come in because has been 2 year. This is first MD visit since onsent of pandemic. Says in jan 2020 had LLL pneumonia and A fib (confirmed on personal visualization of cxr). AFter that doing well. Playing golf. Has chronic post nasal drip - needing oral antihistamine, flonase and astelazine spray but still mild and peristent. ASthma well controlle.d ACQ is 0.2 of 5. Not waking up at night. When waking up barely any symptoms. Not limited in activities. No dyspnea. Just occ baseline wheee (has on exam too). No alb rescue use      OV 08/29/2019  Subjective:  Patient ID: [name], MD, male , DOB: May 17, 1929 , age 62 y.o. 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 -presents for asthma follow-up.  Last seen August 2020.  A month ago he played golf and was fine.  Then on August 15, 2019 he had a sick contact with his grandson.  He then had respiratory illness.  He did a video visit with primary care physician on August 15, 2019.  Ceftin was prescribed.  Also Medrol Dosepak was prescribed according to his history.  After that he is better but still has residual cough therefore he made this appointment.  From the time he made this appointment to today the cough is continued to improve.  His sputum that was yellow is now clear.  Symptoms are only mild.  He was planning to play golf yesterday but because of his atrial fibrillation he did not.  He plans to play golf tomorrow.  He is not interested in much testing or extending steroids or antibiotics unless if clinically indicated.  No current orthopnea.  He continues his  Arnuity without fail.     ROS - per HPI     OV 12/04/2020  Subjective:  Patient ID: [name], MD, male , DOB: 06-14-1929 , age 67 y.o. , MRN: [id number] , ADDRESS: [address] [place] [address] PCP [name], MD Patient Care Team: [name], MD as PCP - General (Family Medicine) Franchot Gallo, MD as Consulting Physician (Urology) Evans Lance, MD as Consulting Physician (Cardiology) Shon Hough, MD as Consulting Physician (Ophthalmology)  This Provider for this visit: Treatment Team:  Attending Provider: Brand Males, MD    12/04/2020 -   Chief Complaint  Patient presents with   Follow-up    Pt states he has been doing okay since last visit. Has had some problems with his asthma after exercising.     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 -presents for asthma follow-up.  Last echocardiogram with severe tricuspid regurgitation and diastolic dysfunction and atrial fibrillation was in 2019.  He follows up with cardiology.  He is elected not to do anymore echocardiograms.  I personally last saw him in July 2021 for asthma.  He continues inhaled steroids.  He only sparingly uses albuterol because of his A. fib and also because he does not feel like he needs it.  Nevertheless when he does go for walking with his wife and comes back he is wheezing quite a bit and has to rest.  He feels it is more related to diastolic dysfunction then asthma per se.  He does not want to do echocardiogram.  Last chest x-ray was in November 2020.  He is agreeable to having another chest x-ray.  We discussed about getting allergy blood work but overall his asthma is under control and we took a shared decision not to do it.  He is up-to-date with his flu shot and COVID by [place] booster.    CT Chest data  No results found.  Results for DARRYEL, DIODATO, MD (MRN [id number]) as of 12/04/2020 13:59  Ref. Range 03/20/2018 08:58 12/26/2018 15:40 12/27/2018 03:32  12/28/2018 05:33 12/29/2018 06:49  Eosinophil Latest Units: % 0 2 0 0 1    PFT  No flowsheet data found.     has a past medical history of Allergic rhinitis, Anxiety, Asthma, Atrial fibrillation ([place]), CAD (coronary artery disease), CHF (congestive heart failure) ([place]), Depression, Diverticulosis (03/16/1995), DJD (degenerative joint disease), GERD (gastroesophageal reflux disease) (12/30/2000), Heart attack ([place]) (1984), Hiatal hernia (12/30/2000), History of blood transfusion (1988), History of duodenal ulcer (10/15/1986), Hyperlipidemia, Hypertension, Sepsis ([place]) (12/2018), Sinus bradycardia, and Stroke ([place]).   reports that he has never smoked. He has never used smokeless tobacco.  Past Surgical History:  Procedure Laterality Date   CARDIAC CATHETERIZATION     CATARACT EXTRACTION W/ INTRAOCULAR LENS  IMPLANT, BILATERAL Bilateral ?2013   [place] WITH STENT PLACEMENT  2010   hospital ccu  1984   heart attack, hypoplastic right coronary   INGUINAL HERNIA REPAIR Right ~ [place] Bilateral 06/10/2017   Procedure: BILATERAL ORCHIECTOMY;  Surgeon: Franchot Gallo, MD;  Location: [facility];  Service: Urology;  Laterality: Bilateral;  MAC ANESTHESIA AND LOCAL   TONSILLECTOMY     TOTAL KNEE ARTHROPLASTY Bilateral 1998-2004   "right-left"   VAGOTOMY AND PYLOROPLASTY  1988   "bleeding duodenal ulcer"    Allergies  Allergen Reactions   Tylenol [Acetaminophen] Rash   Azithromycin    Advil [Ibuprofen] Nausea Only and Rash   Asa [Aspirin] Rash    Immunization History  Administered Date(s) Administered   Fluad Quad(high Dose 65+) 10/17/2018   Influenza Split 10/23/2012, 10/02/2015   Influenza Whole 10/27/2007, 11/13/2008, 10/29/2009, 11/09/2010   Influenza, High Dose Seasonal PF 10/28/2016, 11/13/2020   Influenza,inj,Quad PF,6+ Mos 10/19/2017   Influenza-Unspecified 12/09/2013, 10/28/2014, 11/12/2019    PFIZER(Purple Top)SARS-COV-2 Vaccination 03/19/2019, 04/08/2019, 09/27/2019   Pneumococcal Conjugate-13 09/17/2013   Pneumococcal Polysaccharide-23 08/30/2000, 11/29/2005   Td 06/20/2007, 01/11/2018   Tdap 01/11/2018  Zoster Recombinat (Shingrix) 11/07/2017, 11/22/2017, 01/11/2018   Zoster, Live 06/20/2007    Family History  Problem Relation Age of Onset   Coronary artery disease Mother        deceased   Deep vein thrombosis Sister    Coronary artery disease Brother    Asthma Brother    Colon cancer Neg Hx      Current Outpatient Medications:    albuterol (VENTOLIN HFA) 108 (90 Base) MCG/ACT inhaler, Inhale 2 puffs into the lungs every 4 (four) hours as needed for wheezing or shortness of breath., Disp: 18 g, Rfl: 2   apixaban (ELIQUIS) 5 MG TABS tablet, Take 1 tablet (5 mg total) by mouth 2 (two) times daily., Disp: 180 tablet, Rfl: 3   atorvastatin (LIPITOR) 20 MG tablet, TAKE 1 TABLET(20 MG) BY MOUTH DAILY, Disp: 90 tablet, Rfl: 0   buPROPion (WELLBUTRIN SR) 150 MG 12 hr tablet, TAKE 1 TABLET(150 MG) BY MOUTH DAILY, Disp: 90 tablet, Rfl: 3   cetirizine (ZYRTEC) 10 MG tablet, Take 10 mg by mouth daily with supper. , Disp: , Rfl:    Cholecalciferol (VITAMIN D) 2000 units tablet, Take 2,000 Units by mouth daily., Disp: , Rfl:    dofetilide (TIKOSYN) 250 MCG capsule, TAKE 1 CAPSULE(250 MCG) BY MOUTH TWICE DAILY, Disp: 120 capsule, Rfl: 3   fluticasone (FLONASE) 50 MCG/ACT nasal spray, SHAKE LIQUID AND USE 2 SPRAYS IN EACH NOSTRIL DAILY, Disp: 48 g, Rfl: 3   Fluticasone Furoate (ARNUITY ELLIPTA) 200 MCG/ACT AEPB, Inhale 1 puff into the lungs daily., Disp: 60 each, Rfl: 0   LORazepam (ATIVAN) 1 MG tablet, TAKE 1/2 TO 1 TABLET BY MOUTH AT BEDTIME AS NEEDED, Disp: 90 tablet, Rfl: 0   metoprolol tartrate (LOPRESSOR) 25 MG tablet, Take 1 tablet (25 mg total) by mouth daily as needed (for a heart rate greater than 100)., Disp: 30 tablet, Rfl: 5   omeprazole (PRILOSEC OTC) 20 MG tablet, Take  1 tablet (20 mg total) by mouth daily., Disp: 100 tablet, Rfl: 4   tamsulosin (FLOMAX) 0.4 MG CAPS capsule, TAKE 1 CAPSULE(0.4 MG) BY MOUTH DAILY, Disp: 90 capsule, Rfl: 0   nitroGLYCERIN (NITROSTAT) 0.4 MG SL tablet, Place 1 tablet (0.4 mg total) under the tongue every 5 (five) minutes x 3 doses as needed for chest pain. (Patient not taking: Reported on 12/04/2020), Disp: 25 tablet, Rfl: 2      Objective:   Vitals:   12/04/20 1343  BP: 102/64  Pulse: 74  SpO2: 100%  Weight: 154 lb 6.4 oz (70 kg)  Height: 5' 9.5" (1.765 m)    Estimated body mass index is 22.47 kg/m as calculated from the following:   Height as of this encounter: 5' 9.5" (1.765 m).   Weight as of this encounter: 154 lb 6.4 oz (70 kg).  _0 @  Filed Weights   12/04/20 1343  Weight: 154 lb 6.4 oz (70 kg)     Physical Exam General: No distress. Look s well Neuro: Alert and Oriented x 3. GCS 15. Speech normal Psych: Pleasant Resp:  Barrel Chest - no.  Wheeze - no, Crackles - no, No overt respiratory distress CVS: Normal heart sounds. Murmurs - no Ext: Stigmata of Connective Tissue Disease - no HEENT: Normal upper airway. PEERL +. No post nasal drip        Assessment:       ICD-10-CM   1. Mild persistent asthma without complication  K91.79  Plan:     Patient Instructions     ICD-10-CM   1. Mild persistent asthma without complication  Q41.28     Asthma well controlled Last CXR Nov 2020  [place] as before -Use albuterol as needed -Continue to mask and social distance because of the several viruses circulating  - enjoy golf  - echo and blood work decisions per cardiology -glad you had flu shot and covid vaccine  Follow-up -6-9 months or sooner if needed -Call us or return sooner if needed    SIGNATURE    Dr. Brand Males, M.D., F.C.C.P,  Pulmonary and Critical Care Medicine Staff Physician, [place] Director - Interstitial  Lung Disease  Program  P[facility] [place], [place], [address]  Pager: [phone], If no answer or between  15:00h - 7:00h: call [phone] Telephone: [phone]  2:21 PM 12/04/2020

## 2020-12-04 NOTE — Patient Instructions (Addendum)
ICD-10-CM   1. Mild persistent asthma without complication  W86.16     Asthma well controlled Last CXR Nov 2020  [place] as before -Use albuterol as needed -Continue to mask and social distance because of the several viruses circulating  - enjoy golf  - echo and blood work decisions per cardiology -glad you had flu shot and covid vaccine - cxr 12/04/2020   Follow-up -6-9 months or sooner if needed -Call us or return sooner if needed

## 2020-12-06 NOTE — Progress Notes (Addendum)
Cardiology Office Note Date:  12/08/2020  Patient ID:  [name], MD, DOB 1929-12-17, MRN [id number] PCP:  [name], MD  Cardiologist:  Dr. Aundra Dubin Electrophysiologist: Dr. Lovena Le Pulmonary: Dr. 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  He comes in today to be seen for Dr. Lovena Le, last seen by him via tele medicine visit June 2020, no changes were made.  Following regularly with HF team  Most recently he saw Dr. Aundra Dubin, Aug 2022, was doing fairly well, June his watch noted a fair amount of AFib, apparently less in July.  He was exercising regularly with a trainer, golfing as well. The patient happy with his current arrhythmia burden and exercise capacity.  Wanted to hold off on updating his echo and any medication changes.  TODAY Generally he feels like his Afib burden in October has been up from his baseline. He has had at least one episode that lasted 4 days He reports HRs with his AFib initially 100's, though settle to the 80's, feels more tire in AFib, but still exercising and golfing. He wishes he could golf more often, this seems in October to some degree been limited by the AFib He and his wife walk most nights, on the way back is inclined and the hills get home winded though once home settles quickly  He has no CP with his exercising, walking, golfing, though when in Afib has some heaviness. NO near syncope or syncope. He does feel orthostatic dizziness when standing quickly.  He denies any bleeding or signs of bleeding    AAD Hx Multaq remotely > stopped 2/2 recurrent AFib Tikosyn 2017 >> current  Stroke on warfarin >> Eliquis  Past Medical History:  Diagnosis Date   Allergic rhinitis    Anxiety     Asthma    Atrial fibrillation (HCC)    CAD (coronary artery disease)    CHF (congestive heart failure) ([place])    Depression    Diverticulosis 03/16/1995   DJD (degenerative joint disease)    "most joints" (03/11/2015)   GERD (gastroesophageal reflux disease) 12/30/2000   Heart attack ([place]) 1984   Hiatal hernia 12/30/2000   History of blood transfusion 1988   "related to GI bleeding OR"   History of duodenal ulcer 10/15/1986   Hyperlipidemia    Hypertension    Sepsis ([place]) 12/2018   Sinus bradycardia    Stroke [facility])     Past Surgical History:  Procedure Laterality Date   CARDIAC CATHETERIZATION     CATARACT EXTRACTION W/ INTRAOCULAR LENS  IMPLANT, BILATERAL Bilateral ?2013   [place] WITH STENT PLACEMENT  2010   hospital ccu  1984   heart attack, hypoplastic right coronary   INGUINAL HERNIA REPAIR Right ~ [place] Bilateral 06/10/2017   Procedure: BILATERAL ORCHIECTOMY;  Surgeon: Franchot Gallo, MD;  Location: [facility];  Service: Urology;  Laterality: Bilateral;  MAC ANESTHESIA AND LOCAL   TONSILLECTOMY     TOTAL KNEE ARTHROPLASTY Bilateral 1998-2004   "right-left"   VAGOTOMY AND PYLOROPLASTY  1988   "bleeding duodenal ulcer"    Current Outpatient Medications  Medication Sig Dispense Refill  albuterol (VENTOLIN HFA) 108 (90 Base) MCG/ACT inhaler Inhale 2 puffs into the lungs every 4 (four) hours as needed for wheezing or shortness of breath. 18 g 2   apixaban (ELIQUIS) 5 MG TABS tablet Take 1 tablet (5 mg total) by mouth 2 (two) times daily. 180 tablet 3   atorvastatin (LIPITOR) 20 MG tablet TAKE 1 TABLET(20 MG) BY MOUTH DAILY 90 tablet 0   buPROPion (WELLBUTRIN SR) 150 MG 12 hr tablet TAKE 1 TABLET(150 MG) BY MOUTH DAILY 90 tablet 3   cetirizine (ZYRTEC) 10 MG tablet Take 10 mg by mouth daily with supper.      Cholecalciferol (VITAMIN D) 2000 units tablet Take 2,000 Units by mouth daily.      dofetilide (TIKOSYN) 250 MCG capsule TAKE 1 CAPSULE(250 MCG) BY MOUTH TWICE DAILY 120 capsule 3   fluticasone (FLONASE) 50 MCG/ACT nasal spray SHAKE LIQUID AND USE 2 SPRAYS IN EACH NOSTRIL DAILY 48 g 3   Fluticasone Furoate (ARNUITY ELLIPTA) 200 MCG/ACT AEPB Inhale 1 puff into the lungs daily. 60 each 0   LORazepam (ATIVAN) 1 MG tablet TAKE 1/2 TO 1 TABLET BY MOUTH AT BEDTIME AS NEEDED 90 tablet 0   metoprolol tartrate (LOPRESSOR) 25 MG tablet Take 1 tablet (25 mg total) by mouth daily as needed (for a heart rate greater than 100). 30 tablet 5   nitroGLYCERIN (NITROSTAT) 0.4 MG SL tablet Place 1 tablet (0.4 mg total) under the tongue every 5 (five) minutes x 3 doses as needed for chest pain. 25 tablet 2   omeprazole (PRILOSEC OTC) 20 MG tablet Take 1 tablet (20 mg total) by mouth daily. 100 tablet 4   tamsulosin (FLOMAX) 0.4 MG CAPS capsule TAKE 1 CAPSULE(0.4 MG) BY MOUTH DAILY 90 capsule 0   No current facility-administered medications for this visit.    Allergies:   Tylenol [acetaminophen], Azithromycin, Advil [ibuprofen], and Asa [aspirin]   Social History:  The patient  reports that he has never smoked. He has never used smokeless tobacco. He reports that he does not drink alcohol and does not use drugs.   Family History:  The patient's family history includes Asthma in his brother; Coronary artery disease in his brother and mother; Deep vein thrombosis in his sister.  ROS:  Please see the history of present illness.    All other systems are reviewed and otherwise negative.   PHYSICAL EXAM:  VS:  BP (!) 104/50   Pulse 77   Ht 5' 9.5" (1.765 m)   Wt 153 lb (69.4 kg)   SpO2 96%   BMI 22.27 kg/m  BMI: Body mass index is 22.27 kg/m. Well nourished, well developed, in no acute distress HEENT: normocephalic, atraumatic Neck: no JVD, carotid bruits or masses Cardiac:  RRR;1/6 SM, no rubs, or gallops Lungs:  CTA b/l, no wheezing, rhonchi or rales Abd: soft, nontender MS: no deformity,  age appropriate/perhaps advanced atrophy Ext: no edema Skin: warm and dry, no rash Neuro:  No gross deficits appreciated Psych: euthymic mood, full affect   EKG:  Done today and reviewed by myself shows  SR 77bpm, QTc 432m  09/22/2017: TTE Study Conclusions  - Left ventricle: The cavity size was normal. Systolic function was    normal. The estimated ejection fraction was in the range of 60%    to 65%. Wall motion was normal; there were no regional wall    motion abnormalities. Doppler parameters are consistent with    abnormal left ventricular relaxation (grade 1 diastolic  dysfunction).  - Aortic valve: Trileaflet; moderately thickened, moderately    calcified leaflets. There was trivial regurgitation.  - Mitral valve: Calcified annulus. Mildly thickened leaflets .    There was trivial regurgitation.  - Left atrium: The atrium was mildly dilated. Volume/bsa, S: 36.7    ml/m^2.  - Tricuspid valve: There was severe regurgitation.  - Pulmonary arteries: Systolic pressure was moderately increased.    PA peak pressure: 62 mm Hg (S).   Impressions:   - Compared to the prior study, there has been no significant    interval change.   Recent Labs: 10/03/2020: BUN 29; Creatinine, Ser 1.30; Hemoglobin 12.0; Magnesium 2.0; Platelets 220; Potassium 4.8; Sodium 139  10/03/2020: Cholesterol 85; HDL 38; LDL Cholesterol 36; Total CHOL/HDL Ratio 2.2; Triglycerides 53; VLDL 11   CrCl cannot be calculated (Patient's most recent lab result is older than the maximum 21 days allowed.).   Wt Readings from Last 3 Encounters:  12/08/20 153 lb (69.4 kg)  12/04/20 154 lb 6.4 oz (70 kg)  10/03/20 154 lb 12.8 oz (70.2 kg)     Other studies reviewed: Additional studies/records reviewed today include: summarized above  ASSESSMENT AND PLAN:  Paroxysmal AFib CHA2DS2Vasc is 7, on Eliquis, appropriately dosed for creat/weight Tikosyn, QTc looks OK Last Creat 1.3 (using today's weight) Calc crCl is  38, will update his labs increased burden by symptoms  We discussed amiodarone as the next AAD option, he would like to avoid amiodarone We discussed pending his labs today, Creat that if his Clearance remains down some, and need to reduce his Tikosyn, may want to revisit making the switch to amio.  He is most comfortable for now, staying the course, monitoring the burden and if when he sees Dr. Aundra Dubin in Dec still having a fair amount of Afib making the change  ADDEND: 12/10/20: labs noted, discussed with Dr. Lovena Le, given stable QTc, advised to stay on Tikosyn 273mg with no changes.  Keep follow up as is.   Chronic CHF (diastolic) VHD w/severe TR Mod elevated PA pressures by his last echo 2019 DOE with inclines is his baseline, not escalating No exam findings to suggest volume OL, weight is stable  4.  CAD A little heaviness in the chest when in Afib with controlled rates No symptoms with physical exertion, including regular exercise On PRN BB for AFib (this makes him dizzy when he takes it), no ASA with Eliquis, on a statin Deferred to Dr. MAundra Dubin Disposition: F/u with Dr. MAundra Dubinas scheduled, EP in [place] if needed   Current medicines are reviewed at length with the patient today.  The patient did not have any concerns regarding medicines.  SiVenetia NightPA-C 12/08/2020 9:50 AM     [facility] [address] [place] [phone])  [phone])

## 2020-12-08 ENCOUNTER — Encounter: Payer: Self-pay | Admitting: Physician Assistant

## 2020-12-08 ENCOUNTER — Other Ambulatory Visit: Payer: Self-pay

## 2020-12-08 ENCOUNTER — Ambulatory Visit: Payer: PPO | Admitting: Physician Assistant

## 2020-12-08 VITALS — BP 104/50 | HR 77 | Ht 69.5 in | Wt 153.0 lb

## 2020-12-08 DIAGNOSIS — Z79899 Other long term (current) drug therapy: Secondary | ICD-10-CM | POA: Diagnosis not present

## 2020-12-08 DIAGNOSIS — I2510 Atherosclerotic heart disease of native coronary artery without angina pectoris: Secondary | ICD-10-CM

## 2020-12-08 DIAGNOSIS — I5032 Chronic diastolic (congestive) heart failure: Secondary | ICD-10-CM | POA: Diagnosis not present

## 2020-12-08 DIAGNOSIS — I480 Paroxysmal atrial fibrillation: Secondary | ICD-10-CM | POA: Diagnosis not present

## 2020-12-08 LAB — BASIC METABOLIC PANEL
BUN/Creatinine Ratio: 24 (ref 10–24)
BUN: 33 mg/dL — ABNORMAL HIGH (ref 8–27)
CO2: 23 mmol/L (ref 20–29)
Calcium: 9.2 mg/dL (ref 8.6–10.2)
Chloride: 105 mmol/L (ref 96–106)
Creatinine, Ser: 1.38 mg/dL — ABNORMAL HIGH (ref 0.76–1.27)
Glucose: 76 mg/dL (ref 70–99)
Potassium: 4.5 mmol/L (ref 3.5–5.2)
Sodium: 140 mmol/L (ref 134–144)
eGFR: 49 mL/min/{1.73_m2} — ABNORMAL LOW (ref >59)

## 2020-12-08 LAB — MAGNESIUM: Magnesium: 2 mg/dL (ref 1.6–2.3)

## 2020-12-08 NOTE — Patient Instructions (Addendum)
Medication Instructions:   Your physician recommends that you continue on your current medications as directed. Please refer to the Current Medication list given to you today.   *If you need a refill on your cardiac medications before your next appointment, please call your pharmacy*   Lab Work:  BMET AND [place]    If you have labs (blood work) drawn today and your tests are completely normal, you will receive your results only by: [place] (if you have MyChart) OR A paper copy in the mail If you have any lab test that is abnormal or we need to change your treatment, we will call you to review the results.   Testing/Procedures: NONE ORDERED  TODAY     Follow-Up: At [facility], you and your health needs are our priority.  As part of our continuing mission to provide you with exceptional heart care, we have created designated Provider Care Teams.  These Care Teams include your primary Cardiologist (physician) and Advanced Practice Providers (APPs -  Physician Assistants and Nurse Practitioners) who all work together to provide you with the care you need, when you need it.  We recommend signing up for the patient portal called "MyChart".  Sign up information is provided on this After Visit Summary.  MyChart is used to connect with patients for Virtual Visits (Telemedicine).  Patients are able to view lab/test results, encounter notes, upcoming appointments, etc.  Non-urgent messages can be sent to your provider as well.   To learn more about what you can do with MyChart, go to [email or web address].    Your next appointment:   4 month(s)  The format for your next appointment:   In Person  Provider:   You may see Dr. Lovena Le  or one of the following Advanced Practice Providers on your designated Care Team:   Tommye Standard, [place] Legrand Como "Jonni Sanger" Chalmers Cater, [place]   Other Instructions

## 2020-12-10 ENCOUNTER — Telehealth: Payer: Self-pay | Admitting: *Deleted

## 2020-12-10 NOTE — Telephone Encounter (Signed)
Spoke with patient who verbalized understanding that per Dr. Lovena Le based upon lab results and stable EKG that no need to change dose on Tikosyn at this time.

## 2020-12-22 ENCOUNTER — Other Ambulatory Visit: Payer: Self-pay | Admitting: Family Medicine

## 2020-12-22 MED ORDER — ATORVASTATIN CALCIUM 20 MG PO TABS: ORAL_TABLET | ORAL | 3 refills | Status: DC

## 2020-12-22 MED ORDER — TAMSULOSIN HCL 0.4 MG PO CAPS: ORAL_CAPSULE | ORAL | 3 refills | Status: DC

## 2021-01-01 ENCOUNTER — Other Ambulatory Visit: Payer: Self-pay | Admitting: Family Medicine

## 2021-01-05 ENCOUNTER — Other Ambulatory Visit: Payer: Self-pay | Admitting: Family Medicine

## 2021-01-06 NOTE — Telephone Encounter (Signed)
Last filled 10/19/2020 Last OV 08/15/2019  Ok to fill?

## 2021-01-09 ENCOUNTER — Ambulatory Visit (HOSPITAL_COMMUNITY)
Admission: RE | Admit: 2021-01-09 | Discharge: 2021-01-09 | Disposition: A | Discharge status: Home / Self Care | Payer: PPO | Source: Ambulatory Visit | Attending: Cardiology | Admitting: Cardiology

## 2021-01-09 VITALS — BP 108/70 | HR 73 | Wt 156.0 lb

## 2021-01-09 DIAGNOSIS — I25119 Atherosclerotic heart disease of native coronary artery with unspecified angina pectoris: Secondary | ICD-10-CM | POA: Insufficient documentation

## 2021-01-09 DIAGNOSIS — I959 Hypotension, unspecified: Secondary | ICD-10-CM | POA: Diagnosis not present

## 2021-01-09 DIAGNOSIS — I480 Paroxysmal atrial fibrillation: Secondary | ICD-10-CM | POA: Diagnosis not present

## 2021-01-09 DIAGNOSIS — I071 Rheumatic tricuspid insufficiency: Secondary | ICD-10-CM | POA: Insufficient documentation

## 2021-01-09 DIAGNOSIS — I130 Hypertensive heart and chronic kidney disease with heart failure and stage 1 through stage 4 chronic kidney disease, or unspecified chronic kidney disease: Secondary | ICD-10-CM | POA: Insufficient documentation

## 2021-01-09 DIAGNOSIS — Z79899 Other long term (current) drug therapy: Secondary | ICD-10-CM | POA: Diagnosis not present

## 2021-01-09 DIAGNOSIS — E785 Hyperlipidemia, unspecified: Secondary | ICD-10-CM | POA: Insufficient documentation

## 2021-01-09 DIAGNOSIS — Z8546 Personal history of malignant neoplasm of prostate: Secondary | ICD-10-CM | POA: Diagnosis not present

## 2021-01-09 DIAGNOSIS — Z7901 Long term (current) use of anticoagulants: Secondary | ICD-10-CM | POA: Insufficient documentation

## 2021-01-09 DIAGNOSIS — J45909 Unspecified asthma, uncomplicated: Secondary | ICD-10-CM | POA: Diagnosis not present

## 2021-01-09 DIAGNOSIS — I5032 Chronic diastolic (congestive) heart failure: Secondary | ICD-10-CM

## 2021-01-09 NOTE — Patient Instructions (Signed)
Your physician recommends that you schedule a follow-up appointment in: 6 months (June 2023), **PLEASE CALL OUR OFFICE IN April TO SCHEDULE THIS APPOINTMENT  If you have any questions or concerns before your next appointment please send [place] a message through [place] or call our office at [phone].    TO LEAVE A MESSAGE FOR THE NURSE SELECT OPTION 2, PLEASE LEAVE A MESSAGE INCLUDING: YOUR NAME DATE OF BIRTH CALL BACK NUMBER REASON FOR CALL**this is important as we prioritize the call backs  YOU WILL RECEIVE A CALL BACK THE SAME DAY AS LONG AS YOU CALL BEFORE 4:00 PM  At the [facility], you and your health needs are our priority. As part of our continuing mission to provide you with exceptional heart care, we have created designated Provider Care Teams. These Care Teams include your primary Cardiologist (physician) and Advanced Practice Providers (APPs- Physician Assistants and Nurse Practitioners) who all work together to provide you with the care you need, when you need it.   You may see any of the following providers on your designated Care Team at your next follow up: Dr Glori Bickers Dr Haynes Kerns, NP Lyda Jester, [place] [facility] [place], [place] Audry Riles, PharmD   Please be sure to bring in all your medications bottles to every appointment.

## 2021-01-09 NOTE — Progress Notes (Signed)
Medication Samples have been provided to the patient.  Drug name: Eliquis       Strength: 5mg         Qty: 5  LOT: [id number]  Exp.Date: 9/24  Dosing instructions: take 1 tab Twice daily   The patient has been instructed regarding the correct time, dose, and frequency of taking this medication, including desired effects and most common side effects.   [name] [name] 12:06 PM 01/09/2021

## 2021-01-11 NOTE — Progress Notes (Addendum)
Patient ID: [name] Spence, MD, male   DOB: 1929-10-13, 87 y.o.   MRN: [id number] PCP: [name], MD Cardiology: Dr. 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  He had an RV infarct with chronic total occlusion of a small nondominant RCA in the 1980s.  He had a drug-eluting stent to the CFX in 11/10. He is very symptomatic with atrial fibrillation episodes. He has had a chronic pattern of mild shortness of breath and mild left shoulder pain/chest aching when walking up a hill (x 20 years).   He can walk 3 miles on flat ground without problems.  He golfs 3-4 times a week.  He walks for 20-30 minutes on a treadmill several times a week.   Echo in 7/16 showed normal LV systolic function, EF 69-67% with moderate diastolic dysfunction.  Moderate TR with PA systolic pressure 65 mmHg.  Normal RV size and systolic function.  Given volume overload on exam and abnormal echo, he was started on low dose Lasix in the past.    He was started on Multaq but continued to have episodes of atrial fibrillation.  This was stopped, and he was admitted for Tikosyn loading.  After he went home, he developed left facial droop and left arm weakness.  He was found to have a small right parietal cortical infarct with hemorrhagic conversion.  His symptoms resolved quickly. INR was subtherapeutic at admission.  Warfarin was stopped and he was put on apixaban.   In 9/17, he was admitted with LLL PNA.  During the admission, he had a couple of relatively short runs of atrial fibrillation.  He developed more frequent episodes of atrial fibrillation at home and was started on Tikosyn.   In 5/19, he had bilateral orchiectomy for metastatic prostate cancer.  His PSA has come down considerably.   In 1/20, he had influenza that progressed to PNA.  He was hospitalized with transient atrial fibrillation.    In 11/20, he was  admitted with an E coli UTI and urosepsis with hypotension and AKI.  He was initially fluid-resuscitated and later required diuresis.  He was in atrial fibrillation while in the hospital.   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  He had very frequent AF in October, often after playing golf in the heat.  However, significantly fewer episodes in November/December.  He notes fatigue when in AF.  He works out with a Physiological scientist twice a week and goes on walks with his wife.  He is short of breath walking up hills but not on flat ground.  Occasional wheezing (has history of asthma). He notes chest tightness when walking up steep hills that resolves with rest.  This is chronic x 15 years, no change to pattern.   ECG (personally reviewed): NSR, QTc 445 msec   Labs (6/11): LDL 63, HDL 35, K 5.5, creatinine 1.1 Labs (6/12): LDL 58, HDL 53, TSH normal, K 4.7, creatinine 1 Labs (12/12): BNP 79 Labs (7/14): K 5, creatinine 1.1, LDL 48, HDL 34 Labs (8/15): LDL 48, HDL 33, TSH normal, K 5.4, creatinine 1.2 Labs (6/16): K 5.1, creatinine 1.27, HCT 42.5 Labs (10/16): LDL 37, HDL 32 Labs (11/16): K 4.4, creatinine 1.36, BNP 103 Labs  (2/17): K 4.5, creatinine 1.38, Mg 2.0, LDL 32 Labs (3/17): HCT 41.7 Labs (5/17): K 4.2, creatinine 1.17, Mg 2.1 Labs (  9/17): K 4.2, creatinine 1.16, HCT 37.3 Labs (11/17): hgb 13.3, LDL 40 Labs (5/18): K 4.5, creatinine 1.25 Labs (5/19): K 5.5, creatinine 1.16 Labs (2/20): K 4, creatinine 1.24, hgb 12.2 Labs (11/20): K 3.4, creatinine 1.34, hgb 10.3, LDL 49 Labs (12/20): K 4.5, creatinine 1.2 Labs (2/21): K 3.9, creatinine 1.5, TSH 4.5 (mildly elevated) Labs (5/21): TSH normal, K 5, creatinine 1.27 Labs (9/21): K 4.1, creatinine 1.22, LDL 40, TSH normal Labs (4/22): K 4.2, creatinine 1.11 Labs (8/22): hgb 12, LDL 36 Labs (10/22): K 4.5, creatinine 1.38  PMH: 1. CAD: RV infarct in the 1980s.  Chronic total occlusion of a small  nondominant RCA.  11/10 PCI to CFX with Xience DES.  EF 60% on LV-gram at that time. ETT (11/14) with 9'07" exercise, no significant ST depression.  2.  Paroxysmal atrial fibrillation: Multaq was ineffective. Now on Tikosyn.  3.  Hyperlipidemia 4. GERD 5. HTN 6. Asthma 7. Chronic diastolic CHF: Echo (6/22): EF 55-60%, mild LV hypertrophy, no regional WMAs, mild AI, mild MR, mild-moderate biatrial enlargement, PA systolic pressure 51 mmHg. Echo (7/16) with EF 60-65%, moderate diastolic dysfunction, normal RV size and systolic function, mild-moderate MR, moderate TR, moderate biatrial enlargement, PA systolic pressure 65 mmHg.  - Echo (5/18): EF 55-60%, mild AI, mild MR, PASP 60 mmHg, normal RV size and systolic function.  - Echo (8/19): EF 60-65%, normal RV size and systolic function, severe TR with PASP 60 mmHg.  8. CKD 9. CVA: Right parietal cortical CVA with peri-infarct hemorrhagic conversion.  CTA showed right M2 branch flow gap.   10. Prostate cancer: Metastatic.  - Bilateral orchiectomy in 5/19.   SH: Nonsmoker.  Retired Stage manager, lives in [place].  2 daughters.  Originally from [place].   FH: Brother with CABG.   ROS: All systems reviewed and negative except as per HPI.    Current Outpatient Medications  Medication Sig Dispense Refill   albuterol (VENTOLIN HFA) 108 (90 Base) MCG/ACT inhaler Inhale 2 puffs into the lungs every 4 (four) hours as needed for wheezing or shortness of breath. 18 g 2   apixaban (ELIQUIS) 5 MG TABS tablet Take 1 tablet (5 mg total) by mouth 2 (two) times daily. 180 tablet 3   atorvastatin (LIPITOR) 20 MG tablet Take 1 tablet by mouth once daily 90 tablet 3   buPROPion (WELLBUTRIN SR) 150 MG 12 hr tablet TAKE 1 TABLET(150 MG) BY MOUTH DAILY 30 tablet 0   cetirizine (ZYRTEC) 10 MG tablet Take 10 mg by mouth daily with supper.      Cholecalciferol (VITAMIN D) 2000 units tablet Take 2,000 Units by mouth daily.     dofetilide (TIKOSYN) 250 MCG capsule  TAKE 1 CAPSULE(250 MCG) BY MOUTH TWICE DAILY 120 capsule 3   fluticasone (FLONASE) 50 MCG/ACT nasal spray SHAKE LIQUID AND USE 2 SPRAYS IN EACH NOSTRIL DAILY 48 g 3   Fluticasone Furoate (ARNUITY ELLIPTA) 200 MCG/ACT AEPB Inhale 1 puff into the lungs daily. 60 each 0   LORazepam (ATIVAN) 1 MG tablet TAKE 1/2 TO 1 TABLET BY MOUTH AT BEDTIME AS NEEDED 90 tablet 1   metoprolol tartrate (LOPRESSOR) 25 MG tablet Take 1 tablet (25 mg total) by mouth daily as needed (for a heart rate greater than 100). 30 tablet 5   nitroGLYCERIN (NITROSTAT) 0.4 MG SL tablet Place 1 tablet (0.4 mg total) under the tongue every 5 (five) minutes x 3 doses as needed for chest pain. 25 tablet 2   omeprazole (PRILOSEC OTC)  20 MG tablet Take 1 tablet (20 mg total) by mouth daily. 100 tablet 4   tamsulosin (FLOMAX) 0.4 MG CAPS capsule TAKE 1 CAPSULE(0.4 MG) BY MOUTH DAILY 90 capsule 3   No current facility-administered medications for this encounter.    BP 108/70   Pulse 73   Wt 70.8 kg (156 lb)   SpO2 97%   BMI 22.71 kg/m  General: NAD Neck: No JVD, no thyromegaly or thyroid nodule.  Lungs: Clear to auscultation bilaterally with normal respiratory effort. CV: Nondisplaced PMI.  Heart regular S1/S2, no S3/S4, 2/6 HSM LLSB.  Trace ankle edema.  No carotid bruit.  Normal pedal pulses.  Abdomen: Soft, nontender, no hepatosplenomegaly, no distention.  Skin: Intact without lesions or rashes.  Neurologic: Alert and oriented x 3.  Psych: Normal affect. Extremities: No clubbing or cyanosis.  HEENT: Normal.   Assessment/Plan: 1. CAD: He has chronic stable angina walking up a steep hill.  No change in pattern x 15 years.   - Continue medical management of angina, no indication for stress test or cath.  - He is on apixaban without ASA given stable CAD.   - Continue statin - Refill prn sublingual NTG.  2. Hyperlipidemia: Good lipids in 8/22 on atorvastatin 20 mg daily.  3. Chronic diastolic CHF:  Most recent echo in 8/19  showed EF 16-10% but PA systolic pressure was estimated at 60 mmHg and there was severe TR (progressed from moderate), the RV looked normal.  I suspect that he has a degree of RV failure accounting for NYHA class II symptoms.  He is not taking Lasix now and weight is stable.  He is not volume overloaded on exam.  - He wants to hold off on repeat echo.  Given age, think this is reasonable as unlikely to change management much.   - He can stay off Lasix for now.  4. Atrial fibrillation: Paroxysmal.  Episodes are quite symptomatic (fatigue) and often triggered by some form of physical stress, he feels "bad" with atrial fibrillation even when he's rate-controlled. He had frequent breakthrough on Multaq but and is now on Tikosyn.  QTc ok on today's echo.  He is now off diltiazem CD and atenolol due to low BP and orthostasis.  He had a lot of AF in October but much less in November/December.  - Can take metoprolol 25 mg prn tachypalpitations.  - Continue Tikosyn.  BMET/Mg today.  - If he develops more frequent breakthrough afib on Tikosyn, amiodarone would be an option.  He is not interested today in switching.  - Continue Eliquis.  5. Tricuspid regurgitation: Severe on 8/19 echo, he does have a TR murmur.  Not candidate for surgical TVR and not interested in referral to trial for percutaneous repair.   Followup in 6 months.     Loralie Champagne 01/11/2021

## 2021-01-12 ENCOUNTER — Ambulatory Visit: Payer: PPO | Admitting: Family Medicine

## 2021-01-13 ENCOUNTER — Ambulatory Visit: Payer: PPO | Admitting: Sports Medicine

## 2021-01-13 DIAGNOSIS — M67979 Unspecified disorder of synovium and tendon, unspecified ankle and foot: Secondary | ICD-10-CM

## 2021-01-14 ENCOUNTER — Encounter: Payer: Self-pay | Admitting: Family Medicine

## 2021-01-14 ENCOUNTER — Ambulatory Visit (INDEPENDENT_AMBULATORY_CARE_PROVIDER_SITE_OTHER): Payer: PPO | Admitting: Family Medicine

## 2021-01-14 VITALS — BP 140/70 | HR 68 | Temp 97.5°F | Ht 71.5 in | Wt 156.3 lb

## 2021-01-14 DIAGNOSIS — F5104 Psychophysiologic insomnia: Secondary | ICD-10-CM

## 2021-01-14 DIAGNOSIS — F339 Major depressive disorder, recurrent, unspecified: Secondary | ICD-10-CM

## 2021-01-14 DIAGNOSIS — M818 Other osteoporosis without current pathological fracture: Secondary | ICD-10-CM | POA: Diagnosis not present

## 2021-01-14 DIAGNOSIS — R351 Nocturia: Secondary | ICD-10-CM

## 2021-01-14 MED ORDER — BUPROPION HCL ER (SR) 150 MG PO TB12: ORAL_TABLET | ORAL | 3 refills | Status: DC

## 2021-01-14 MED ORDER — FLUOXETINE HCL 10 MG PO CAPS: 10.0000 mg | ORAL_CAPSULE | Freq: Every day | ORAL | 3 refills | Status: DC

## 2021-01-14 NOTE — Progress Notes (Signed)
Established Patient Office Visit  Subjective:  Patient ID: [name] Spence, MD, male    DOB: September 26, 1929  Age: 87 y.o. MRN: [id number]  CC:  Chief Complaint  Patient presents with   Medication Consultation    HPI [name] Spence, MD presents for medication follow-up.  He is followed by multiple specialist including cardiology, pulmonary, urology.  Past history of prostate cancer.  He is had previous orchiectomy.  Recent DEXA revealed osteoporosis.  He is looking at Prolia injections.  History of A. fib followed by cardiology.  He is on Eliquis and Tikosyn.  He takes beta-blocker as needed for increased heart rate.  Had some chronic issues with urine frequency at night and somewhat slow urinary flow.  He is currently on tamsulosin.  Followed by urology.  His main concern is decreased mood.  He attributes this to aging and decreased activity levels.  He still plays golf and plays about 9 holes couple times per week but has had difficulty adapting to scale back in activities because of age.  He has battled some depression for years.  He has been on Wellbutrin 150 mg daily for about 25 years.  He feels like this is losing its benefit.  No suicidal ideation.  Somewhat low motivation.  Also feels anxious at times.  He has been on low-dose lorazepam at night for years.  He apparently took Prozac years ago with some benefit and is not sure why this was discontinued  His wife is currently battling some health issues with TMJ syndrome  Past Medical History:  Diagnosis Date   Allergic rhinitis    Anxiety    Asthma    Atrial fibrillation ([place])    CAD (coronary artery disease)    CHF (congestive heart failure) ([place])    Depression    Diverticulosis 03/16/1995   DJD (degenerative joint disease)    "most joints" (03/11/2015)   GERD (gastroesophageal reflux disease) 12/30/2000   Heart attack ([place]) 1984   Hiatal hernia 12/30/2000   History of blood transfusion 1988   "related to GI bleeding OR"    History of duodenal ulcer 10/15/1986   Hyperlipidemia    Hypertension    Sepsis ([place]) 12/2018   Sinus bradycardia    Stroke [facility])     Past Surgical History:  Procedure Laterality Date   CARDIAC CATHETERIZATION     CATARACT EXTRACTION W/ INTRAOCULAR LENS  IMPLANT, BILATERAL Bilateral ?2013   [place] WITH STENT PLACEMENT  2010   hospital ccu  1984   heart attack, hypoplastic right coronary   INGUINAL HERNIA REPAIR Right ~ [place] Bilateral 06/10/2017   Procedure: BILATERAL ORCHIECTOMY;  Surgeon: Franchot Gallo, MD;  Location: [facility];  Service: Urology;  Laterality: Bilateral;  MAC ANESTHESIA AND LOCAL   TONSILLECTOMY     TOTAL KNEE ARTHROPLASTY Bilateral 1998-2004   "right-left"   VAGOTOMY AND PYLOROPLASTY  1988   "bleeding duodenal ulcer"    Family History  Problem Relation Age of Onset   Coronary artery disease Mother        deceased   Deep vein thrombosis Sister    Coronary artery disease Brother    Asthma Brother    Colon cancer Neg Hx     Social History   Socioeconomic History   Marital status: Married    Spouse name: Not on file   Number of children: 2   Years of  education: Not on file   Highest education level: Doctorate  Occupational History   Occupation: IT sales professional: RETIRED    Comment: radiologist  Tobacco Use   Smoking status: Never   Smokeless tobacco: Never  Vaping Use   Vaping Use: Never used  Substance and Sexual Activity   Alcohol use: No   Drug use: No   Sexual activity: Not Currently  Other Topics Concern   Not on file  Social History Narrative   Married. Regular exercise - yes.       03/26/2019: Married to Therapist, sports, lives in 3 story home, although lives on first level mostly. Has two grown children.      Enjoys golfing; drives to [place] with wife every winter for a few days, watches sports, following Merchandiser, retail, reads about WWII,    Social  Determinants of Radio broadcast assistant Strain: Low Risk    Difficulty of Paying Living Expenses: Not hard at all  Food Insecurity: No Food Insecurity   Worried About Charity fundraiser in the Last Year: Never true   Arboriculturist in the Last Year: Never true  Transportation Needs: No Transportation Needs   Lack of Transportation (Medical): No   Lack of Transportation (Non-Medical): No  Physical Activity: Sufficiently Active   Days of Exercise per Week: 4 days   Minutes of Exercise per Session: 60 min  Stress: No Stress Concern Present   Feeling of Stress : Not at all  Social Connections: Moderately Integrated   Frequency of Communication with Friends and Family: More than three times a week   Frequency of Social Gatherings with Friends and Family: Three times a week   Attends Religious Services: More than 4 times per year   Active Member of Clubs or Organizations: No   Attends Archivist Meetings: Never   Marital Status: Married  Human resources officer Violence: Not At Risk   Fear of Current or Ex-Partner: No   Emotionally Abused: No   Physically Abused: No   Sexually Abused: No    Outpatient Medications Prior to Visit  Medication Sig Dispense Refill   albuterol (VENTOLIN HFA) 108 (90 Base) MCG/ACT inhaler Inhale 2 puffs into the lungs every 4 (four) hours as needed for wheezing or shortness of breath. 18 g 2   apixaban (ELIQUIS) 5 MG TABS tablet Take 1 tablet (5 mg total) by mouth 2 (two) times daily. 180 tablet 3   atorvastatin (LIPITOR) 20 MG tablet Take 1 tablet by mouth once daily 90 tablet 3   cetirizine (ZYRTEC) 10 MG tablet Take 10 mg by mouth daily with supper.      Cholecalciferol (VITAMIN D) 2000 units tablet Take 2,000 Units by mouth daily.     dofetilide (TIKOSYN) 250 MCG capsule TAKE 1 CAPSULE(250 MCG) BY MOUTH TWICE DAILY 120 capsule 3   fluticasone (FLONASE) 50 MCG/ACT nasal spray SHAKE LIQUID AND USE 2 SPRAYS IN EACH NOSTRIL DAILY 48 g 3    Fluticasone Furoate (ARNUITY ELLIPTA) 200 MCG/ACT AEPB Inhale 1 puff into the lungs daily. 60 each 0   LORazepam (ATIVAN) 1 MG tablet TAKE 1/2 TO 1 TABLET BY MOUTH AT BEDTIME AS NEEDED 90 tablet 1   metoprolol tartrate (LOPRESSOR) 25 MG tablet Take 1 tablet (25 mg total) by mouth daily as needed (for a heart rate greater than 100). 30 tablet 5   nitroGLYCERIN (NITROSTAT) 0.4 MG SL tablet Place 1 tablet (0.4 mg total) under the tongue every  5 (five) minutes x 3 doses as needed for chest pain. 25 tablet 2   omeprazole (PRILOSEC OTC) 20 MG tablet Take 1 tablet (20 mg total) by mouth daily. 100 tablet 4   tamsulosin (FLOMAX) 0.4 MG CAPS capsule TAKE 1 CAPSULE(0.4 MG) BY MOUTH DAILY 90 capsule 3   buPROPion (WELLBUTRIN SR) 150 MG 12 hr tablet TAKE 1 TABLET(150 MG) BY MOUTH DAILY 30 tablet 0   No facility-administered medications prior to visit.    Allergies  Allergen Reactions   Tylenol [Acetaminophen] Rash   Azithromycin    Advil [Ibuprofen] Nausea Only and Rash   Asa [Aspirin] Rash    ROS Review of Systems  Constitutional:  Positive for fatigue. Negative for unexpected weight change.  Eyes:  Negative for visual disturbance.  Respiratory:  Negative for cough, chest tightness and shortness of breath.   Cardiovascular:  Negative for chest pain, palpitations and leg swelling.  Neurological:  Negative for dizziness, syncope, weakness, light-headedness and headaches.  Psychiatric/Behavioral:  Positive for dysphoric mood. The patient is nervous/anxious.      Objective:    Physical Exam Constitutional:      Appearance: He is well-developed.  HENT:     Right Ear: External ear normal.     Left Ear: External ear normal.  Eyes:     Pupils: Pupils are equal, round, and reactive to light.  Neck:     Thyroid: No thyromegaly.  Cardiovascular:     Rate and Rhythm: Normal rate and regular rhythm.  Pulmonary:     Effort: Pulmonary effort is normal. No respiratory distress.     Breath sounds:  Normal breath sounds. No wheezing or rales.  Musculoskeletal:     Cervical back: Neck supple.  Neurological:     Mental Status: He is alert and oriented to person, place, and time.  Psychiatric:        Mood and Affect: Mood normal.        Thought Content: Thought content normal.    BP 140/70 (BP Location: Left Arm, Patient Position: Sitting, Cuff Size: Normal)   Pulse 68   Temp (!) 97.5 F (36.4 C) (Oral)   Ht 5' 11.5" (1.816 m)   Wt 156 lb 4.8 oz (70.9 kg)   SpO2 98%   BMI 21.50 kg/m  Wt Readings from Last 3 Encounters:  01/14/21 156 lb 4.8 oz (70.9 kg)  01/13/21 156 lb (70.8 kg)  01/09/21 156 lb (70.8 kg)     Health Maintenance Due  Topic Date Due   COVID-19 Vaccine (4 - Booster for Pfizer series) 11/22/2019    There are no preventive care reminders to display for this patient.  Lab Results  Component Value Date   TSH 2.926 06/13/2019   Lab Results  Component Value Date   WBC 9.2 10/03/2020   HGB 12.0 (L) 10/03/2020   HCT 38.9 (L) 10/03/2020   MCV 96.5 10/03/2020   PLT 220 10/03/2020   Lab Results  Component Value Date   NA 140 12/08/2020   K 4.5 12/08/2020   CO2 23 12/08/2020   GLUCOSE 76 12/08/2020   BUN 33 (H) 12/08/2020   CREATININE 1.38 (H) 12/08/2020   BILITOT 0.6 12/28/2018   ALKPHOS 66 12/28/2018   AST 47 (H) 12/28/2018   ALT 36 12/28/2018   PROT 4.9 (L) 12/28/2018   ALBUMIN 2.1 (L) 12/28/2018   CALCIUM 9.2 12/08/2020   ANIONGAP 5 10/03/2020   EGFR 49 (L) 12/08/2020   GFR 64.31 01/07/2016  Lab Results  Component Value Date   CHOL 85 10/03/2020   Lab Results  Component Value Date   HDL 38 (L) 10/03/2020   Lab Results  Component Value Date   LDLCALC 36 10/03/2020   Lab Results  Component Value Date   TRIG 53 10/03/2020   Lab Results  Component Value Date   CHOLHDL 2.2 10/03/2020   Lab Results  Component Value Date   HGBA1C 6.3 (H) 04/01/2015      Assessment & Plan:   #1 longstanding history of recurrent depression.   Patient currently on low-dose Wellbutrin.  We discussed options including increasing Wellbutrin or possible addition of second medication.  After much discussion we decided to add low-dose Prozac 10 mg once daily.  He has tolerated this well in the past.  Touch base in a month or so.  If still struggling at that point we could either further titrate Prozac or increase his Wellbutrin.  #2 chronic insomnia.  Patient aware of increased risk of benzodiazepines with increased risk of falls.  Continue low-dose lorazepam which he has been on for many years  #3 nocturia.  This has been a chronic issue for him.  He is on Flomax and followed by urology.  Probably has a least some element of detrusor muscle instability.  Meds ordered this encounter  Medications   buPROPion (WELLBUTRIN SR) 150 MG 12 hr tablet    Sig: TAKE 1 TABLET(150 MG) BY MOUTH DAILY    Dispense:  90 tablet    Refill:  3   FLUoxetine (PROZAC) 10 MG capsule    Sig: Take 1 capsule (10 mg total) by mouth daily.    Dispense:  90 capsule    Refill:  3    Follow-up: No follow-ups on file.    Carolann Littler, MD

## 2021-01-14 NOTE — Progress Notes (Signed)
Patient ID: [name] Spence, MD, male   DOB: 04-Feb-1930, 87 y.o.   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  Last set of orthotics were made in September 2019.  He has a longstanding history of chronic posterior tibialis tendon insufficiency of the right foot and ankle.  No recent trauma.  Custom orthotics were completed today.  Using the old orthotics as a template, we added an additional scaphoid pad to both the top and bottom of the right orthotic.  I also added a first ray post as well as two medial heel wedges.  Despite all this he still tends to pronate with walking.  I also refurbished his old orthotics by replacing his scaphoid pads and heel wedges.  I believe he can get a little more use out of these as well.  Follow-up as needed.  Patient was fitted for a : standard, cushioned, semi-rigid orthotic. The orthotic was heated and afterward the patient stood on the orthotic blank positioned on the orthotic stand. The patient was positioned in subtalar neutral position and 10 degrees of ankle dorsiflexion in a weight bearing stance. After completion of molding, a stable base was applied to the orthotic blank. The blank was ground to a stable position for weight bearing. Size: 11 Base: Blue EVA Posting: First ray post on the right Additional orthotic padding: Scaphoid pad on both the top and the bottom of the right orthotic as well as two medial heel wedges.

## 2021-01-15 ENCOUNTER — Other Ambulatory Visit: Payer: Self-pay | Admitting: Internal Medicine

## 2021-02-13 DIAGNOSIS — C61 Malignant neoplasm of prostate: Secondary | ICD-10-CM | POA: Diagnosis not present

## 2021-02-18 DIAGNOSIS — N401 Enlarged prostate with lower urinary tract symptoms: Secondary | ICD-10-CM | POA: Diagnosis not present

## 2021-02-18 DIAGNOSIS — R351 Nocturia: Secondary | ICD-10-CM | POA: Diagnosis not present

## 2021-02-18 DIAGNOSIS — C7951 Secondary malignant neoplasm of bone: Secondary | ICD-10-CM | POA: Diagnosis not present

## 2021-02-18 DIAGNOSIS — M818 Other osteoporosis without current pathological fracture: Secondary | ICD-10-CM | POA: Diagnosis not present

## 2021-02-18 DIAGNOSIS — C61 Malignant neoplasm of prostate: Secondary | ICD-10-CM | POA: Diagnosis not present

## 2021-03-04 ENCOUNTER — Emergency Department (HOSPITAL_COMMUNITY): Payer: PPO

## 2021-03-04 ENCOUNTER — Inpatient Hospital Stay (HOSPITAL_COMMUNITY)
Admission: EM | Admit: 2021-03-04 | Discharge: 2021-03-07 | DRG: 193 | Disposition: A | Discharge status: Home / Self Care | Payer: PPO | Attending: Internal Medicine | Admitting: Internal Medicine

## 2021-03-04 ENCOUNTER — Telehealth: Payer: Self-pay | Admitting: Internal Medicine

## 2021-03-04 DIAGNOSIS — K219 Gastro-esophageal reflux disease without esophagitis: Secondary | ICD-10-CM | POA: Diagnosis present

## 2021-03-04 DIAGNOSIS — Z8673 Personal history of transient ischemic attack (TIA), and cerebral infarction without residual deficits: Secondary | ICD-10-CM

## 2021-03-04 DIAGNOSIS — I5032 Chronic diastolic (congestive) heart failure: Secondary | ICD-10-CM | POA: Diagnosis present

## 2021-03-04 DIAGNOSIS — Z96653 Presence of artificial knee joint, bilateral: Secondary | ICD-10-CM | POA: Diagnosis present

## 2021-03-04 DIAGNOSIS — Z7901 Long term (current) use of anticoagulants: Secondary | ICD-10-CM

## 2021-03-04 DIAGNOSIS — J101 Influenza due to other identified influenza virus with other respiratory manifestations: Secondary | ICD-10-CM

## 2021-03-04 DIAGNOSIS — I5030 Unspecified diastolic (congestive) heart failure: Secondary | ICD-10-CM | POA: Diagnosis present

## 2021-03-04 DIAGNOSIS — J154 Pneumonia due to other streptococci: Secondary | ICD-10-CM | POA: Diagnosis present

## 2021-03-04 DIAGNOSIS — E7800 Pure hypercholesterolemia, unspecified: Secondary | ICD-10-CM | POA: Diagnosis not present

## 2021-03-04 DIAGNOSIS — Z79899 Other long term (current) drug therapy: Secondary | ICD-10-CM

## 2021-03-04 DIAGNOSIS — Z8546 Personal history of malignant neoplasm of prostate: Secondary | ICD-10-CM

## 2021-03-04 DIAGNOSIS — I110 Hypertensive heart disease with heart failure: Secondary | ICD-10-CM | POA: Diagnosis present

## 2021-03-04 DIAGNOSIS — R509 Fever, unspecified: Secondary | ICD-10-CM | POA: Diagnosis present

## 2021-03-04 DIAGNOSIS — N401 Enlarged prostate with lower urinary tract symptoms: Secondary | ICD-10-CM | POA: Diagnosis present

## 2021-03-04 DIAGNOSIS — Z961 Presence of intraocular lens: Secondary | ICD-10-CM | POA: Diagnosis present

## 2021-03-04 DIAGNOSIS — I2510 Atherosclerotic heart disease of native coronary artery without angina pectoris: Secondary | ICD-10-CM | POA: Diagnosis present

## 2021-03-04 DIAGNOSIS — R9431 Abnormal electrocardiogram [ECG] [EKG]: Secondary | ICD-10-CM

## 2021-03-04 DIAGNOSIS — J45901 Unspecified asthma with (acute) exacerbation: Secondary | ICD-10-CM | POA: Diagnosis present

## 2021-03-04 DIAGNOSIS — Z825 Family history of asthma and other chronic lower respiratory diseases: Secondary | ICD-10-CM

## 2021-03-04 DIAGNOSIS — I480 Paroxysmal atrial fibrillation: Secondary | ICD-10-CM | POA: Diagnosis present

## 2021-03-04 DIAGNOSIS — J9601 Acute respiratory failure with hypoxia: Secondary | ICD-10-CM | POA: Diagnosis present

## 2021-03-04 DIAGNOSIS — J45909 Unspecified asthma, uncomplicated: Secondary | ICD-10-CM | POA: Diagnosis not present

## 2021-03-04 DIAGNOSIS — R351 Nocturia: Secondary | ICD-10-CM

## 2021-03-04 DIAGNOSIS — Z20822 Contact with and (suspected) exposure to covid-19: Secondary | ICD-10-CM | POA: Diagnosis present

## 2021-03-04 DIAGNOSIS — F411 Generalized anxiety disorder: Secondary | ICD-10-CM | POA: Diagnosis present

## 2021-03-04 DIAGNOSIS — I272 Pulmonary hypertension, unspecified: Secondary | ICD-10-CM | POA: Diagnosis present

## 2021-03-04 DIAGNOSIS — Z886 Allergy status to analgesic agent status: Secondary | ICD-10-CM

## 2021-03-04 DIAGNOSIS — J309 Allergic rhinitis, unspecified: Secondary | ICD-10-CM | POA: Diagnosis present

## 2021-03-04 DIAGNOSIS — I361 Nonrheumatic tricuspid (valve) insufficiency: Secondary | ICD-10-CM | POA: Diagnosis present

## 2021-03-04 DIAGNOSIS — J1008 Influenza due to other identified influenza virus with other specified pneumonia: Principal | ICD-10-CM | POA: Diagnosis present

## 2021-03-04 DIAGNOSIS — E871 Hypo-osmolality and hyponatremia: Secondary | ICD-10-CM | POA: Diagnosis present

## 2021-03-04 DIAGNOSIS — Z881 Allergy status to other antibiotic agents status: Secondary | ICD-10-CM

## 2021-03-04 DIAGNOSIS — U071 COVID-19: Secondary | ICD-10-CM | POA: Diagnosis not present

## 2021-03-04 DIAGNOSIS — W19XXXA Unspecified fall, initial encounter: Secondary | ICD-10-CM | POA: Diagnosis not present

## 2021-03-04 DIAGNOSIS — I252 Old myocardial infarction: Secondary | ICD-10-CM | POA: Diagnosis not present

## 2021-03-04 DIAGNOSIS — R053 Chronic cough: Secondary | ICD-10-CM | POA: Diagnosis not present

## 2021-03-04 DIAGNOSIS — Z8249 Family history of ischemic heart disease and other diseases of the circulatory system: Secondary | ICD-10-CM

## 2021-03-04 DIAGNOSIS — Z9049 Acquired absence of other specified parts of digestive tract: Secondary | ICD-10-CM

## 2021-03-04 DIAGNOSIS — Z955 Presence of coronary angioplasty implant and graft: Secondary | ICD-10-CM

## 2021-03-04 DIAGNOSIS — R918 Other nonspecific abnormal finding of lung field: Secondary | ICD-10-CM | POA: Diagnosis not present

## 2021-03-04 DIAGNOSIS — J111 Influenza due to unidentified influenza virus with other respiratory manifestations: Secondary | ICD-10-CM

## 2021-03-04 DIAGNOSIS — E785 Hyperlipidemia, unspecified: Secondary | ICD-10-CM | POA: Diagnosis present

## 2021-03-04 DIAGNOSIS — Z9861 Coronary angioplasty status: Secondary | ICD-10-CM

## 2021-03-04 DIAGNOSIS — I10 Essential (primary) hypertension: Secondary | ICD-10-CM | POA: Diagnosis not present

## 2021-03-04 DIAGNOSIS — J181 Lobar pneumonia, unspecified organism: Secondary | ICD-10-CM | POA: Diagnosis present

## 2021-03-04 DIAGNOSIS — Z9079 Acquired absence of other genital organ(s): Secondary | ICD-10-CM | POA: Diagnosis not present

## 2021-03-04 DIAGNOSIS — R0902 Hypoxemia: Secondary | ICD-10-CM | POA: Diagnosis not present

## 2021-03-04 LAB — I-STAT VENOUS BLOOD GAS, ED
Acid-Base Excess: 1 mmol/L (ref 0.0–2.0)
Bicarbonate: 23.1 mmol/L (ref 20.0–28.0)
Calcium, Ion: 0.97 mmol/L — ABNORMAL LOW (ref 1.15–1.40)
HCT: 36 % — ABNORMAL LOW (ref 39.0–52.0)
Hemoglobin: 12.2 g/dL — ABNORMAL LOW (ref 13.0–17.0)
O2 Saturation: 99 %
Potassium: 4.4 mmol/L (ref 3.5–5.1)
Sodium: 135 mmol/L (ref 135–145)
TCO2: 24 mmol/L (ref 22–32)
pCO2, Ven: 29 mmHg — ABNORMAL LOW (ref 44.0–60.0)
pH, Ven: 7.509 — ABNORMAL HIGH (ref 7.250–7.430)
pO2, Ven: 146 mmHg — ABNORMAL HIGH (ref 32.0–45.0)

## 2021-03-04 LAB — COMPREHENSIVE METABOLIC PANEL
ALT: 23 U/L (ref 0–44)
AST: 25 U/L (ref 15–41)
Albumin: 3.4 g/dL — ABNORMAL LOW (ref 3.5–5.0)
Alkaline Phosphatase: 69 U/L (ref 38–126)
Anion gap: 9 (ref 5–15)
BUN: 18 mg/dL (ref 8–23)
CO2: 18 mmol/L — ABNORMAL LOW (ref 22–32)
Calcium: 7.7 mg/dL — ABNORMAL LOW (ref 8.9–10.3)
Chloride: 105 mmol/L (ref 98–111)
Creatinine, Ser: 1.3 mg/dL — ABNORMAL HIGH (ref 0.61–1.24)
GFR, Estimated: 53 mL/min — ABNORMAL LOW (ref >60)
Glucose, Bld: 175 mg/dL — ABNORMAL HIGH (ref 70–99)
Potassium: 4 mmol/L (ref 3.5–5.1)
Sodium: 132 mmol/L — ABNORMAL LOW (ref 135–145)
Total Bilirubin: 0.9 mg/dL (ref 0.3–1.2)
Total Protein: 6.2 g/dL — ABNORMAL LOW (ref 6.5–8.1)

## 2021-03-04 LAB — RESP PANEL BY RT-PCR (FLU A&B, COVID) ARPGX2
Influenza A by PCR: POSITIVE — AB
Influenza B by PCR: NEGATIVE
SARS Coronavirus 2 by RT PCR: NEGATIVE

## 2021-03-04 LAB — CBC WITH DIFFERENTIAL/PLATELET
Abs Immature Granulocytes: 0.07 10*3/uL (ref 0.00–0.07)
Basophils Absolute: 0 10*3/uL (ref 0.0–0.1)
Basophils Relative: 0 %
Eosinophils Absolute: 0 10*3/uL (ref 0.0–0.5)
Eosinophils Relative: 0 %
HCT: 38.5 % — ABNORMAL LOW (ref 39.0–52.0)
Hemoglobin: 12.4 g/dL — ABNORMAL LOW (ref 13.0–17.0)
Immature Granulocytes: 1 %
Lymphocytes Relative: 2 %
Lymphs Abs: 0.3 10*3/uL — ABNORMAL LOW (ref 0.7–4.0)
MCH: 30.7 pg (ref 26.0–34.0)
MCHC: 32.2 g/dL (ref 30.0–36.0)
MCV: 95.3 fL (ref 80.0–100.0)
Monocytes Absolute: 1 10*3/uL (ref 0.1–1.0)
Monocytes Relative: 8 %
Neutro Abs: 11.7 10*3/uL — ABNORMAL HIGH (ref 1.7–7.7)
Neutrophils Relative %: 89 %
Platelets: 148 10*3/uL — ABNORMAL LOW (ref 150–400)
RBC: 4.04 MIL/uL — ABNORMAL LOW (ref 4.22–5.81)
RDW: 13.6 % (ref 11.5–15.5)
WBC: 13.1 10*3/uL — ABNORMAL HIGH (ref 4.0–10.5)
nRBC: 0 % (ref 0.0–0.2)

## 2021-03-04 LAB — TROPONIN I (HIGH SENSITIVITY): Troponin I (High Sensitivity): 16 ng/L (ref <18)

## 2021-03-04 MED ORDER — ALBUTEROL SULFATE (2.5 MG/3ML) 0.083% IN NEBU: 2.5000 mg | INHALATION_SOLUTION | RESPIRATORY_TRACT | Status: DC | PRN

## 2021-03-04 MED ORDER — BUPROPION HCL ER (SR) 150 MG PO TB12
150.0000 mg | ORAL_TABLET | Freq: Every day | ORAL | Status: DC
  Administered 2021-03-05 – 2021-03-07 (×3): 150 mg via ORAL
  Filled 2021-03-04 (×3): qty 1

## 2021-03-04 MED ORDER — FLUOXETINE HCL 10 MG PO CAPS
10.0000 mg | ORAL_CAPSULE | Freq: Every day | ORAL | Status: DC
  Administered 2021-03-05 – 2021-03-07 (×3): 10 mg via ORAL
  Filled 2021-03-04 (×3): qty 1

## 2021-03-04 MED ORDER — PANTOPRAZOLE SODIUM 40 MG PO TBEC
40.0000 mg | DELAYED_RELEASE_TABLET | Freq: Every day | ORAL | Status: DC
  Administered 2021-03-05 – 2021-03-07 (×3): 40 mg via ORAL
  Filled 2021-03-04 (×3): qty 1

## 2021-03-04 MED ORDER — DOFETILIDE 250 MCG PO CAPS
250.0000 ug | ORAL_CAPSULE | Freq: Two times a day (BID) | ORAL | Status: DC
  Administered 2021-03-04 – 2021-03-07 (×6): 250 ug via ORAL
  Filled 2021-03-04 (×8): qty 1

## 2021-03-04 MED ORDER — METHYLPREDNISOLONE SODIUM SUCC 125 MG IJ SOLR
125.0000 mg | Freq: Every day | INTRAMUSCULAR | Status: DC
  Filled 2021-03-04: qty 2

## 2021-03-04 MED ORDER — PREDNISONE 50 MG PO TABS
50.0000 mg | ORAL_TABLET | Freq: Every day | ORAL | Status: DC
  Administered 2021-03-05 – 2021-03-07 (×3): 50 mg via ORAL
  Filled 2021-03-04: qty 1
  Filled 2021-03-04: qty 3
  Filled 2021-03-04: qty 1

## 2021-03-04 MED ORDER — ATORVASTATIN CALCIUM 10 MG PO TABS
20.0000 mg | ORAL_TABLET | Freq: Every day | ORAL | Status: DC
  Administered 2021-03-05 – 2021-03-07 (×3): 20 mg via ORAL
  Filled 2021-03-04 (×3): qty 2

## 2021-03-04 MED ORDER — IPRATROPIUM-ALBUTEROL 0.5-2.5 (3) MG/3ML IN SOLN
3.0000 mL | Freq: Once | RESPIRATORY_TRACT | Status: AC
  Administered 2021-03-04: 23:00:00 3 mL via RESPIRATORY_TRACT
  Filled 2021-03-04: qty 3

## 2021-03-04 MED ORDER — NITROGLYCERIN 0.4 MG SL SUBL: 0.4000 mg | SUBLINGUAL_TABLET | SUBLINGUAL | Status: DC | PRN

## 2021-03-04 MED ORDER — SODIUM CHLORIDE 0.9 % IV SOLN: 500.0000 mg | INTRAVENOUS | Status: DC

## 2021-03-04 MED ORDER — APIXABAN 5 MG PO TABS
5.0000 mg | ORAL_TABLET | Freq: Two times a day (BID) | ORAL | Status: DC
  Administered 2021-03-04 – 2021-03-07 (×6): 5 mg via ORAL
  Filled 2021-03-04 (×6): qty 1

## 2021-03-04 MED ORDER — TAMSULOSIN HCL 0.4 MG PO CAPS
0.4000 mg | ORAL_CAPSULE | Freq: Every day | ORAL | Status: DC
  Administered 2021-03-05 – 2021-03-07 (×3): 0.4 mg via ORAL
  Filled 2021-03-04 (×3): qty 1

## 2021-03-04 MED ORDER — BUDESONIDE 0.25 MG/2ML IN SUSP
0.2500 mg | Freq: Two times a day (BID) | RESPIRATORY_TRACT | Status: DC
  Administered 2021-03-05 – 2021-03-07 (×5): 0.25 mg via RESPIRATORY_TRACT
  Filled 2021-03-04 (×7): qty 2

## 2021-03-04 MED ORDER — SODIUM CHLORIDE 0.9% FLUSH
3.0000 mL | Freq: Two times a day (BID) | INTRAVENOUS | Status: DC
  Administered 2021-03-05 – 2021-03-06 (×4): 3 mL via INTRAVENOUS

## 2021-03-04 MED ORDER — AZITHROMYCIN 250 MG PO TABS: 500.0000 mg | ORAL_TABLET | Freq: Every day | ORAL | Status: DC

## 2021-03-04 MED ORDER — SODIUM CHLORIDE 0.9 % IV SOLN
1.0000 g | Freq: Once | INTRAVENOUS | Status: AC
  Administered 2021-03-04: 23:00:00 1 g via INTRAVENOUS
  Filled 2021-03-04: qty 10

## 2021-03-04 MED ORDER — AZITHROMYCIN 500 MG IV SOLR
500.0000 mg | Freq: Once | INTRAVENOUS | Status: DC
  Administered 2021-03-04: 500 mg via INTRAVENOUS
  Filled 2021-03-04: qty 5

## 2021-03-04 MED ORDER — AZITHROMYCIN 250 MG PO TABS: 250.0000 mg | ORAL_TABLET | Freq: Every day | ORAL | Status: DC

## 2021-03-04 MED ORDER — SODIUM CHLORIDE 0.9 % IV SOLN
2.0000 g | Freq: Every day | INTRAVENOUS | Status: DC
  Administered 2021-03-05 – 2021-03-06 (×2): 2 g via INTRAVENOUS
  Filled 2021-03-04 (×3): qty 20

## 2021-03-04 MED ORDER — IPRATROPIUM-ALBUTEROL 0.5-2.5 (3) MG/3ML IN SOLN
3.0000 mL | Freq: Four times a day (QID) | RESPIRATORY_TRACT | Status: DC
  Administered 2021-03-05 (×2): 3 mL via RESPIRATORY_TRACT
  Filled 2021-03-04: qty 3

## 2021-03-04 NOTE — ED Notes (Signed)
[name] [name], wife, [phone] would like an update on pt when available

## 2021-03-04 NOTE — Telephone Encounter (Signed)
Call from wife. Covid  + and wheezing. HE has known asthma, cardiac issues and CKD   Prednisone 40 mg x1 day, then 30 mg x1 day, then 20 mg x1 day, then 10 mg x1 day, and then 5 mg x1 day and stop   Renal pack PAXLOVID - Paxlovid (nirmatelvir 150/ritonavir 100) - BID x 5 days - for GFR 30-60 - d.w Kishanda at [facility]  on [address] during above  xxxxxxx   PLEASE CHECK MED LIST for the following issues. Please check the 2 different condition related to concomitant medications in alphabetical order  If [name], MD with DOB 07-26-1929 is on any of the following Strong CYP3A inhibtors - this patient [name], MD should withold these concomitant meds so they can start paxlovid stratight away. If taking any of these:  alfuzosin, amiodarone, clozapine, colchicine, dihydroergotamine, dronedarone, ergotamine, flecainide, lovastatin, lurasidone, methylergonovine, midazolam [oral], pethidine, pimozide, propafenone, propoxyphene, quinidine, ranolazine, sildenafil simvastatin, triazolam).   If   [name], MD  with dob December 31, 1929 Is on any of these other strong CYP3A inducers then starting paxlovid should be delayed and the following meds should wash out first. These are dapalutamide, carbamazepine, phenobarbital, phenytoin, rifampin, [place] wort) - let me know immediately and we should delay starting paxlovid by some days even if he stops these medication.    PLEASE INFORM [name], MD  OF FOLLOWING SIDE EFFECTS  Side effects - all < 5%  - skin rash (and veyr rare a conditon called TEN) - angiomedia  - myalgia - jaundice - high bP (1%) - loss of taste  - diarrhea - rebound 25% chance    Allergies  Allergen Reactions   Tylenol [Acetaminophen] Rash   Azithromycin    Advil [Ibuprofen] Nausea Only and Rash   Asa [Aspirin] Rash   CrCl cannot be calculated (Patient's most recent lab result is older than the maximum 21 days allowed.).   Latest Reference  Range & Units 06/01/06 10:16 06/20/07 09:10 06/21/08 10:29 12/23/08 09:56 12/27/08 06:20 12/28/08 05:32 07/18/09 11:10 08/20/09 09:56 08/05/10 10:45 08/16/11 09:48 08/16/12 09:38 09/17/13 11:05 07/10/14 10:30 12/24/14 17:10 01/06/15 13:58 02/14/15 13:46 03/06/15 10:00 03/11/15 10:54 03/12/15 03:30 03/13/15 04:00 03/14/15 03:40 03/31/15 19:30 03/31/15 19:40 04/02/15 05:15 04/21/15 08:39 07/02/15 09:30 10/15/15 09:00 10/19/15 08:59 10/20/15 04:01 10/21/15 10:35 01/07/16 10:59 06/25/16 09:39 09/29/16 08:26 06/08/17 09:37 09/19/17 10:16 02/25/18 06:45 02/26/18 03:30 02/27/18 03:12 02/28/18 03:09 03/01/18 02:46 03/02/18 02:50 03/03/18 02:18 03/07/18 16:36 03/20/18 08:58 04/04/18 09:24 12/13/18 12:35 12/22/18 10:48 12/26/18 15:40 12/27/18 03:32 12/27/18 15:35 12/28/18 05:33 12/29/18 06:49 12/30/18 04:51 12/31/18 05:12 01/10/19 10:21 03/16/19 14:29 06/13/19 10:23 10/17/19 11:02 06/03/20 11:07 10/03/20 11:21 12/08/20 09:55  Creatinine 0.76 - 1.27 mg/dL 1.1 1.1 1.1 1.1 1.02 1.03 1.1 1.0 1.0 1.2 1.1 1.2 1.27 (H) 1.46 (H) 1.36 (H) 1.30 (H) 1.35 (H) 1.23 1.38 (H) 1.16 1.15 1.53 (H) 1.40 (H) 1.38 (H) 1.18 (H) 1.17 1.22 1.21 1.21 1.16 1.15 1.25 (H) 1.26 (H) 1.16 1.23 1.27 (H) 1.07 1.03 0.98 1.11 0.97 0.99 0.98 0.94 1.24 1.20 1.48 (H) 2.70 (H) 2.16 (H) 1.91 (H) 1.71 (H) 1.48 (H) 1.28 (H) 1.24 1.21 1.50 (H) 1.27 (H) 1.22 1.11 1.30 (H) 1.38 (H)  (H): Data is abnormally high  Latest Reference Range & Units 12/08/20 09:55  eGFR >59 mL/min/1.73 49 (L)  (L): Data is abnormally low  Current Outpatient Medications:    albuterol (VENTOLIN HFA) 108 (90 Base) MCG/ACT inhaler, Inhale 2  puffs into the lungs every 4 (four) hours as needed for wheezing or shortness of breath., Disp: 18 g, Rfl: 2   apixaban (ELIQUIS) 5 MG TABS tablet, Take 1 tablet (5 mg total) by mouth 2 (two) times daily., Disp: 180 tablet, Rfl: 3   ARNUITY ELLIPTA 200 MCG/ACT AEPB, INHALE 1 PUFF INTO THE LUNGS DAILY, Disp: 60 each, Rfl: 5   atorvastatin (LIPITOR) 20  MG tablet, Take 1 tablet by mouth once daily, Disp: 90 tablet, Rfl: 3   buPROPion (WELLBUTRIN SR) 150 MG 12 hr tablet, TAKE 1 TABLET(150 MG) BY MOUTH DAILY, Disp: 90 tablet, Rfl: 3   cetirizine (ZYRTEC) 10 MG tablet, Take 10 mg by mouth daily with supper. , Disp: , Rfl:    Cholecalciferol (VITAMIN D) 2000 units tablet, Take 2,000 Units by mouth daily., Disp: , Rfl:    dofetilide (TIKOSYN) 250 MCG capsule, TAKE 1 CAPSULE(250 MCG) BY MOUTH TWICE DAILY, Disp: 120 capsule, Rfl: 3   FLUoxetine (PROZAC) 10 MG capsule, Take 1 capsule (10 mg total) by mouth daily., Disp: 90 capsule, Rfl: 3   fluticasone (FLONASE) 50 MCG/ACT nasal spray, SHAKE LIQUID AND USE 2 SPRAYS IN EACH NOSTRIL DAILY, Disp: 48 g, Rfl: 3   LORazepam (ATIVAN) 1 MG tablet, TAKE 1/2 TO 1 TABLET BY MOUTH AT BEDTIME AS NEEDED, Disp: 90 tablet, Rfl: 1   metoprolol tartrate (LOPRESSOR) 25 MG tablet, Take 1 tablet (25 mg total) by mouth daily as needed (for a heart rate greater than 100)., Disp: 30 tablet, Rfl: 5   nitroGLYCERIN (NITROSTAT) 0.4 MG SL tablet, Place 1 tablet (0.4 mg total) under the tongue every 5 (five) minutes x 3 doses as needed for chest pain., Disp: 25 tablet, Rfl: 2   omeprazole (PRILOSEC OTC) 20 MG tablet, Take 1 tablet (20 mg total) by mouth daily., Disp: 100 tablet, Rfl: 4   tamsulosin (FLOMAX) 0.4 MG CAPS capsule, TAKE 1 CAPSULE(0.4 MG) BY MOUTH DAILY, Disp: 90 capsule, Rfl: 3     SIGNATURE    Dr. Brand Males, M.D., F.C.C.P,  Pulmonary and Critical Care Medicine Staff Physician, [place] Director - Interstitial Lung Disease  Program  Pulmonary [place] at [place], [place], [address]  NPI Number:  NPI #[id number]  Pager: [phone], If no answer  -> Check AMION or Try [phone] Telephone (clinical office): [phone] Telephone (research): [phone]  7:02 PM 03/04/2021

## 2021-03-04 NOTE — H&P (Signed)
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 fib, intracerebral hematoma, hyperlipidemia, GERD, CHF, CAD status post PCI, BPH, bradycardia, anxiety, CKD, diverticular low cysts, tricuspid regurgitation who presents with cough and shortness of breath.  Patient has had ongoing cough and shortness of breath for the past few days.  To go home COVID test today which was positive.  He is continuing shortness of breath despite his home inhalers that he takes for asthma and called EMS.  Noted to be saturating in the high 80s on room air improved to 94% on 4 L.   Additionally reports wheezing. He denies fevers, chills, chest pain, abdominal pain, constipation, diarrhea, nausea, vomiting.  Of note, patient is a retired [employer].  ED Course: Vital signs in the ED significant for temperature to 101, respiratory rate in the 20s, heart rate in the 100s to 110s, blood pressure in the 546E 703J systolic.  On 3 L to maintain saturations.  Lab work showed CMP with sodium 132, bicarb 18, creatinine stable 1.3, glucose 175, calcium 10.7, protein 6.2, albumin 3.4.  CBC with leukocytosis 13.1, hemoglobin stable at 12.4, platelets low at 148.  Troponin normal.  Respiratory panel positive for flu but negative for COVID here.  Chest x-ray showed patchy opacities at the right lung base concerning for possible pneumonia including possible aspiration pneumonia.  Strandy opacity at the left lung base favoring atelectasis for scarring. Ceftriaxone and azithromycin ordered in the ED in addition to Solu-Medrol and duo nebs.  Review of Systems: As per HPI otherwise all other systems reviewed and are negative.  Past Medical History:  Diagnosis Date   Allergic rhinitis     Anxiety    Asthma    Atrial fibrillation ([place])    CAD (coronary artery disease)    CHF (congestive heart failure) ([place])    Depression    Diverticulosis 03/16/1995   DJD (degenerative joint disease)    "most joints" (03/11/2015)   GERD (gastroesophageal reflux disease) 12/30/2000   Heart attack ([place]) 1984   Hiatal hernia 12/30/2000   History of blood transfusion 1988   "related to GI bleeding OR"   History of duodenal ulcer 10/15/1986   Hyperlipidemia    Hypertension    Sepsis ([place]) 12/2018   Sinus bradycardia    Stroke [facility])     Past Surgical History:  Procedure Laterality Date   CARDIAC CATHETERIZATION     CATARACT EXTRACTION W/ INTRAOCULAR LENS  IMPLANT, BILATERAL Bilateral ?2013   [place] WITH STENT PLACEMENT  2010   hospital ccu  1984   heart attack, hypoplastic right coronary   INGUINAL HERNIA REPAIR Right ~ [place] Bilateral 06/10/2017   Procedure: BILATERAL ORCHIECTOMY;  Surgeon: Franchot Gallo, MD;  Location: [facility] ORS;  Service: Urology;  Laterality: Bilateral;  MAC ANESTHESIA AND LOCAL   TONSILLECTOMY     TOTAL KNEE ARTHROPLASTY Bilateral 1998-2004   "right-left"   VAGOTOMY AND PYLOROPLASTY  1988   "bleeding duodenal ulcer"    Social History  reports that he has never smoked. He has never used smokeless tobacco. He reports that he does not drink alcohol and does not use drugs.  Allergies  Allergen Reactions   Tylenol [Acetaminophen] Rash   Azithromycin    Advil [Ibuprofen] Nausea Only and Rash   Asa [Aspirin] Rash    Family History  Problem Relation Age of Onset   Coronary artery disease Mother        deceased   Deep vein thrombosis Sister    Coronary artery disease Brother    Asthma Brother    Colon cancer Neg Hx   Reviewed on admission  Prior to Admission medications   Medication Sig Start Date End Date Taking? Authorizing Provider  albuterol (VENTOLIN HFA) 108  (90 Base) MCG/ACT inhaler Inhale 2 puffs into the lungs every 4 (four) hours as needed for wheezing or shortness of breath. 08/29/19   Brand Males, MD  apixaban (ELIQUIS) 5 MG TABS tablet Take 1 tablet (5 mg total) by mouth 2 (two) times daily. 05/21/20   Larey Dresser, MD  ARNUITY ELLIPTA 200 MCG/ACT AEPB INHALE 1 PUFF INTO THE LUNGS DAILY 01/16/21   Brand Males, MD  atorvastatin (LIPITOR) 20 MG tablet Take 1 tablet by mouth once daily 12/22/20   [name], MD  bu[facility] SR) 150 MG 12 hr tablet TAKE 1 TABLET(150 MG) BY MOUTH DAILY 01/14/21   Burchette, Alinda Sierras, MD  cetirizine (ZYRTEC) 10 MG tablet Take 10 mg by mouth daily with supper.     [provider]  Cholecalciferol (VITAMIN D) 2000 units tablet Take 2,000 Units by mouth daily.    [provider]  dofetilide (TIKOSYN) 250 MCG capsule TAKE 1 CAPSULE(250 MCG) BY MOUTH TWICE DAILY 12/01/20   Larey Dresser, MD  FLUoxetine (PROZAC) 10 MG capsule Take 1 capsule (10 mg total) by mouth daily. 01/14/21   Burchette, Alinda Sierras, MD  fluticasone (FLONASE) 50 MCG/ACT nasal spray SHAKE LIQUID AND USE 2 SPRAYS IN EACH NOSTRIL DAILY 09/19/19   Brand Males, MD  LORazepam (ATIVAN) 1 MG tablet TAKE 1/2 TO 1 TABLET BY MOUTH AT BEDTIME AS NEEDED 01/06/21   Burchette, Alinda Sierras, MD  metoprolol tartrate (LOPRESSOR) 25 MG tablet Take 1 tablet (25 mg total) by mouth daily as needed (for a heart rate greater than 100). 06/03/20 06/03/21  Larey Dresser, MD  nitroGLYCERIN (NITROSTAT) 0.4 MG SL tablet Place 1 tablet (0.4 mg total) under the tongue every 5 (five) minutes x 3 doses as needed for chest pain. 01/04/17   [name], MD  omeprazole (PRILOSEC OTC) 20 MG tablet Take 1 tablet (20 mg total) by mouth daily. 09/17/13   [name], MD  tamsulosin (FLOMAX) 0.4 MG CAPS capsule TAKE 1 CAPSULE(0.4 MG) BY MOUTH DAILY 12/22/20   [name], MD    Physical Exam: Vitals:   03/04/21 2200 03/04/21 2230  03/04/21 2300 03/04/21 2330  BP: 139/71 (!) 144/58 (!) 129/54 132/63  Pulse: (!) 110 (!) 109 (!) 116 (!) 114  Resp: 20 20 (!) 23 19  Temp:      TempSrc:      SpO2: 94% 94% 90% 94%   Physical Exam Constitutional:      General: He is not in acute distress.    Appearance: Normal appearance.  HENT:     Head: Normocephalic and atraumatic.     Mouth/Throat:     Mouth: Mucous membranes are moist.     Pharynx: Oropharynx is clear.  Eyes:     Extraocular Movements: Extraocular movements intact.     Pupils: Pupils are equal, round, and reactive to light.  Cardiovascular:  Rate and Rhythm: Normal rate and regular rhythm.     Pulses: Normal pulses.     Heart sounds: Murmur heard.  Pulmonary:     Effort: Pulmonary effort is normal. No respiratory distress.     Breath sounds: Wheezing present.  Abdominal:     General: Bowel sounds are normal. There is no distension.     Palpations: Abdomen is soft.     Tenderness: There is no abdominal tenderness.  Musculoskeletal:        General: No swelling or deformity.  Skin:    General: Skin is warm and dry.  Neurological:     General: No focal deficit present.     Mental Status: Mental status is at baseline.   Labs on Admission: I have personally reviewed following labs and imaging studies  CBC: Recent Labs  Lab 03/04/21 2151 03/04/21 2243  WBC 13.1*  --   NEUTROABS 11.7*  --   HGB 12.4* 12.2*  HCT 38.5* 36.0*  MCV 95.3  --   PLT 148*  --     Basic Metabolic Panel: Recent Labs  Lab 03/04/21 2151 03/04/21 2243  NA 132* 135  K 4.0 4.4  CL 105  --   CO2 18*  --   GLUCOSE 175*  --   BUN 18  --   CREATININE 1.30*  --   CALCIUM 7.7*  --     GFR: CrCl cannot be calculated (Unknown ideal weight.).  Liver Function Tests: Recent Labs  Lab 03/04/21 2151  AST 25  ALT 23  ALKPHOS 69  BILITOT 0.9  PROT 6.2*  ALBUMIN 3.4*    Urine analysis:    Component Value Date/Time   COLORURINE AMBER (A) 12/26/2018 1640    APPEARANCEUR CLOUDY (A) 12/26/2018 1640   LABSPEC 1.016 12/26/2018 1640   PHURINE 5.0 12/26/2018 1640   GLUCOSEU NEGATIVE 12/26/2018 1640   HGBUR MODERATE (A) 12/26/2018 1640   HGBUR trace-lysed 07/18/2009 1022   BILIRUBINUR NEGATIVE 12/26/2018 1640   BILIRUBINUR N 01/04/2017 1524   KETONESUR NEGATIVE 12/26/2018 1640   PROTEINUR 100 (A) 12/26/2018 1640   UROBILINOGEN 0.2 01/04/2017 1524   UROBILINOGEN 0.2 07/18/2009 1022   NITRITE NEGATIVE 12/26/2018 1640   LEUKOCYTESUR LARGE (A) 12/26/2018 1640    Radiological Exams on Admission: DG Chest Portable 1 View  Result Date: 03/04/2021 CLINICAL DATA:  Evaluate for pneumonia. EXAM: PORTABLE CHEST 1 VIEW COMPARISON:  Chest x-ray 12/04/2020. FINDINGS: The aorta is ectatic. Cardiac silhouette is within normal limits. There are minimal patchy opacities in the medial right lung base. There are strandy opacities in the left lung base favored is atelectasis or scarring. There is no pleural effusion or pneumothorax. No acute fractures are seen. IMPRESSION: 1. Minimal patchy opacities in the medial right lung base worrisome for infection or aspiration. Followup PA and lateral chest X-ray is recommended in 3-4 weeks following trial of antibiotic therapy to ensure resolution and exclude underlying malignancy. Electronically Signed   By: Ronney Asters M.D.   On: 03/04/2021 22:58    EKG: Not yet performed  Assessment/Plan Principal Problem:   Acute respiratory failure with hypoxia (HCC) Active Problems:   Hyperlipidemia   Anxiety state   CAD S/P OM PCI 2010   GERD   Paroxysmal atrial fibrillation (HCC)   History of CVA (cerebrovascular accident)   BPH associated with nocturia   Influenza B   Congestive heart failure with left ventricular diastolic dysfunction (HCC)   Asthma, chronic, unspecified asthma severity, with acute exacerbation  Acute respiratory failure with hypoxia Asthma exacerbation Pneumonia Flu > Patient presenting with several  days of shortness of breath and cough.  Reportedly took a home COVID test which was positive but he is negative for COVID here.  Is positive for flu however. > Chest x-ray showing evidence of possible pneumonia in the right lower lobe, with leukocytosis patient was started on ceftriaxone azithromycin in the ED. > Also noted to have wheezing consistent with known asthma exacerbation.  Did receive steroids and DuoNeb for this. > Noted to be hypoxic via EMS in the 86% range.  Started on 4 L saturating well now saturating well on 3 L. - Monitor on telemetry - Continue with supplemental oxygen, wean as tolerated - Continue with ceftriaxone and azithromycin, check EKG as patient has history of QTC prolongation with the azithromycin - Daily prednisone - DuoNebs every 6 hours while awake - Albuterol as needed - Continue home fluticasone - Trend CBC - Check strep and Legionella urinary antigen Addendum > QTC checked via EKG and noted to be prolonged at 505.  Azithromycin discontinued.  Hyperlipidemia CAD > Status post PCI - Continue home atorvastatin  Atrial fibrillation - Continue home Tikosyn and Eliquis  CHF > Last echo in 2019 with EF 40-10%, grade 1 diastolic dysfunction, severe tricuspid regurgitation > Not currently on any diuretic  BPH - Continue home Flomax  Anxiety - Continue home bupropion, fluoxetine, Ativan  GERD - Continue PPI   DVT prophylaxis: Eliquis  Code Status:   Full  Family Communication:  None on admission. He states his wife is up to date and he spoke with her about 1 hour ago.  Disposition Plan:   Patient is from:  Home  Anticipated DC to:  Home   Anticipated DC date:  1 to 3 days  Anticipated DC barriers: None  Consults called:  None Admission status:  Observation, telemetry   Severity of Illness: The appropriate patient status for this patient is OBSERVATION. Observation status is judged to be reasonable and necessary in order to provide the required  intensity of service to ensure the patient's safety. The patient's presenting symptoms, physical exam findings, and initial radiographic and laboratory data in the context of their medical condition is felt to place them at decreased risk for further clinical deterioration. Furthermore, it is anticipated that the patient will be medically stable for discharge from the hospital within 2 midnights of admission.    Marcelyn Bruins MD [facility]  How to contact the [facility] Attending or Consulting provider [place] or covering provider during after hours [place], for this patient?   Check the care team in [facility] and look for a) attending/consulting [facility] provider listed and b) the [facility] team listed Log into [email or web address] and use [place] universal password to access. If you do not have the password, please contact the hospital operator. Locate the [facility] provider you are looking for under [facility] and page to a number that you can be directly reached. If you still have difficulty reaching the provider, please page the [facility] (Director on Call) for the Hospitalists listed on amion for assistance.  03/04/2021, 11:47 PM

## 2021-03-04 NOTE — ED Provider Notes (Signed)
[facility] EMERGENCY DEPARTMENT Provider Note  CSN: [id number] Arrival date & time: 03/04/21 2140  Chief Complaint(s) No chief complaint on file.  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 fib, CVA, prostate cancer status post bilateral orchiectomy, chronic diastolic CHF, severe tricuspid regurg, HTN, HLD who presents the emergency department for evaluation of cough and shortness of breath in setting of a known COVID infection.  Patient diagnosed with COVID today with a home testing kit.  EMS found the patient to be 86% on room air.  Patient arrives on 4 L nasal cannula satting 94%.  Patient arrives febrile, tachycardic.  Denies chest pain, abdominal pain, nausea, vomiting, diarrhea or other systemic symptoms.  HPI  Past Medical History Past Medical History:  Diagnosis Date   Allergic rhinitis    Anxiety    Asthma    Atrial fibrillation ([place])    CAD (coronary artery disease)    CHF (congestive heart failure) ([place])    Depression    Diverticulosis 03/16/1995   DJD (degenerative joint disease)    "most joints" (03/11/2015)   GERD (gastroesophageal reflux disease) 12/30/2000   Heart attack ([place]) 1984   Hiatal hernia 12/30/2000   History of blood transfusion 1988   "related to GI bleeding OR"   History of duodenal ulcer 10/15/1986   Hyperlipidemia    Hypertension    Sepsis ([place]) 12/2018   Sinus bradycardia    Stroke [facility])    Patient Active Problem List   Diagnosis Date Noted   Bacteremia 12/27/2018   Abnormal x-ray of pelvis 11/17 12/27/2018   Pyelonephritis 35/57/3220   Metabolic acidosis 25/42/7062   Acute lower UTI 12/26/2018   Congestive heart failure with left ventricular diastolic dysfunction ([place]) 12/26/2018   Sepsis ([place]) 12/26/2018   Acute kidney injury superimposed on CKD ([place]) 12/26/2018   Lobar pneumonia (HCC)-left lower lobs  02/26/2018   Influenza B 02/26/2018   Severe sepsis ([place]) 02/25/2018   BPH  associated with nocturia 11/21/2017   Arthritis of carpometacarpal ([place]) joint of right thumb 07/06/2016   Right foot pain 02/11/2016   Abnormality of gait 11/27/2015   Pain in joint, ankle and foot 11/27/2015   History of asthma 10/23/2015   Chronic diastolic heart failure ([place])    Acute hemorrhagic infarction of brain ([place])    Hypersensitivity reaction 04/01/2015   Persistent atrial fibrillation 04/01/2015   ICH (intracerebral hemorrhage) ([place])    TIA (transient ischemic attack) 03/31/2015   Visit for monitoring Tikosyn therapy 03/11/2015   Paroxysmal atrial fibrillation ([place])    Pulmonary hypertension ([place]) 09/26/2014   Bradycardia 37/62/8315   Diastolic dysfunction 17/61/6073   CAD S/P OM PCI 2010 12/16/2008   Hyperlipidemia 06/20/2007   Anxiety state 06/20/2007   Essential hypertension 06/20/2007   Allergic rhinitis 06/20/2007   Mild persistent asthma without complication 71/07/2692   GERD 06/20/2007   Home Medication(s) Prior to Admission medications   Medication Sig Start Date End Date Taking? Authorizing Provider  albuterol (VENTOLIN HFA) 108 (90 Base) MCG/ACT inhaler Inhale 2 puffs into the lungs every 4 (four) hours as needed for wheezing or shortness of breath. 08/29/19   Brand Males, MD  apixaban (ELIQUIS) 5 MG TABS tablet Take 1 tablet (5 mg total) by mouth 2 (two) times daily. 05/21/20   Larey Dresser, MD  ARNUITY ELLIPTA 200 MCG/ACT AEPB INHALE 1 PUFF INTO THE LUNGS DAILY 01/16/21   Brand Males, MD  atorvastatin (LIPITOR)  20 MG tablet Take 1 tablet by mouth once daily 12/22/20   [name], MD  buPROPion [facility] SR) 150 MG 12 hr tablet TAKE 1 TABLET(150 MG) BY MOUTH DAILY 01/14/21   Burchette, Alinda Sierras, MD  cetirizine (ZYRTEC) 10 MG tablet Take 10 mg by mouth daily with supper.     [provider]  Cholecalciferol (VITAMIN D) 2000 units tablet Take 2,000 Units by mouth daily.    [provider]  dofetilide (TIKOSYN) 250 MCG  capsule TAKE 1 CAPSULE(250 MCG) BY MOUTH TWICE DAILY 12/01/20   Larey Dresser, MD  FLUoxetine (PROZAC) 10 MG capsule Take 1 capsule (10 mg total) by mouth daily. 01/14/21   Burchette, Alinda Sierras, MD  fluticasone (FLONASE) 50 MCG/ACT nasal spray SHAKE LIQUID AND USE 2 SPRAYS IN EACH NOSTRIL DAILY 09/19/19   Brand Males, MD  LORazepam (ATIVAN) 1 MG tablet TAKE 1/2 TO 1 TABLET BY MOUTH AT BEDTIME AS NEEDED 01/06/21   Burchette, Alinda Sierras, MD  metoprolol tartrate (LOPRESSOR) 25 MG tablet Take 1 tablet (25 mg total) by mouth daily as needed (for a heart rate greater than 100). 06/03/20 06/03/21  Larey Dresser, MD  nitroGLYCERIN (NITROSTAT) 0.4 MG SL tablet Place 1 tablet (0.4 mg total) under the tongue every 5 (five) minutes x 3 doses as needed for chest pain. 01/04/17   [name], MD  omeprazole (PRILOSEC OTC) 20 MG tablet Take 1 tablet (20 mg total) by mouth daily. 09/17/13   [name], MD  tamsulosin (FLOMAX) 0.4 MG CAPS capsule TAKE 1 CAPSULE(0.4 MG) BY MOUTH DAILY 12/22/20   Burchette, Alinda Sierras, MD                                                                                                                                    Past Surgical History Past Surgical History:  Procedure Laterality Date   CARDIAC CATHETERIZATION     CATARACT EXTRACTION W/ INTRAOCULAR LENS  IMPLANT, BILATERAL Bilateral ?2013   [place] WITH STENT PLACEMENT  2010   hospital ccu  1984   heart attack, hypoplastic right coronary   INGUINAL HERNIA REPAIR Right ~ [place] Bilateral 06/10/2017   Procedure: BILATERAL ORCHIECTOMY;  Surgeon: Franchot Gallo, MD;  Location: [facility]S;  Service: Urology;  Laterality: Bilateral;  MAC ANESTHESIA AND LOCAL   TONSILLECTOMY     TOTAL KNEE ARTHROPLASTY Bilateral 1998-2004   "right-left"   VAGOTOMY AND PYLOROPLASTY  1988   "bleeding duodenal ulcer"   Family History Family History  Problem  Relation Age of Onset   Coronary artery disease Mother        deceased   Deep vein thrombosis Sister    Coronary artery disease Brother    Asthma Brother    Colon cancer Neg Hx     Social History Social  History   Tobacco Use   Smoking status: Never   Smokeless tobacco: Never  Vaping Use   Vaping Use: Never used  Substance Use Topics   Alcohol use: No   Drug use: No   Allergies Tylenol [acetaminophen], Azithromycin, Advil [ibuprofen], and Asa [aspirin]  Review of Systems Review of Systems  Respiratory:  Positive for cough, chest tightness, shortness of breath and wheezing.    Physical Exam Vital Signs  I have reviewed the triage vital signs BP (!) 156/72    Pulse (!) 111    Temp (!) 101 F (38.3 C) (Oral)    Resp 16    SpO2 95%   Physical Exam Vitals and nursing note reviewed.  Constitutional:      General: He is not in acute distress.    Appearance: He is well-developed. He is ill-appearing.  HENT:     Head: Normocephalic and atraumatic.  Eyes:     Conjunctiva/sclera: Conjunctivae normal.  Cardiovascular:     Rate and Rhythm: Regular rhythm. Tachycardia present.     Heart sounds: No murmur heard. Pulmonary:     Effort: Pulmonary effort is normal. No respiratory distress.     Breath sounds: Wheezing present.  Abdominal:     Palpations: Abdomen is soft.     Tenderness: There is no abdominal tenderness.  Musculoskeletal:        General: No swelling.     Cervical back: Neck supple.  Skin:    General: Skin is warm and dry.     Capillary Refill: Capillary refill takes less than 2 seconds.  Neurological:     Mental Status: He is alert.  Psychiatric:        Mood and Affect: Mood normal.    ED Results and Treatments Labs (all labs ordered are listed, but only abnormal results are displayed) Labs Reviewed - No data to display                                                                                                                        Radiology No  results found.  Pertinent labs & imaging results that were available during my care of the patient were reviewed by me and considered in my medical decision making (see MDM for details).  Medications Ordered in ED Medications - No data to display  Procedures .Critical Care Performed by: Teressa Lower, MD Authorized by: Teressa Lower, MD   Critical care provider statement:    Critical care time (minutes):  30   Critical care was necessary to treat or prevent imminent or life-threatening deterioration of the following conditions:  Respiratory failure   Critical care was time spent personally by me on the following activities:  Development of treatment plan with patient or surrogate, discussions with consultants, evaluation of patient's response to treatment, examination of patient, ordering and review of laboratory studies, ordering and review of radiographic studies, ordering and performing treatments and interventions, pulse oximetry, re-evaluation of patient's condition and review of old charts  (including critical care time)  Medical Decision Making / ED Course   This patient presents to the ED for concern of shortness of breath in setting of COVID-19, this involves an extensive number of treatment options, and is a complaint that carries with it a high risk of complications and morbidity.  The differential diagnosis includes COVID-19, PE, CHF exacerbation, asthma exacerbation  MDM: Patient seen emergency department for evaluation of shortness of breath in the setting of a COVID-19 infection.  Physical exam reveals bilateral and expiratory wheezing but is otherwise unremarkable.  Laboratory evaluation with leukocytosis to 13.1, hyponatremia 132, CO2 18 but pH 7.50, creatinine elevated 1.3.  Has since of her troponin negative at 16.  Here in the emergency  department, presently, the patient's COVID test is negative but his influenza test is positive.  Chest x-ray with right lower lobe infiltrate concerning for possible aspiration or pneumonia.  Patient started on ceftriaxone azithromycin.  Patient remains saturating 94% on 4 L nasal cannula and will require admission for new oxygen requirement in the setting of influenza.  For the patient's wheezing he was given a DuoNeb and 125 methylprednisolone.  Patient admitted.   Additional history obtained:  -External records from outside source obtained and reviewed including: Chart review including previous notes, labs, imaging, consultation notes   Lab Tests: -I ordered, reviewed, and interpreted labs.   The pertinent results include:   Labs Reviewed - No data to display    EKG   EKG Interpretation  Date/Time:    Ventricular Rate:    PR Interval:    QRS Duration:   QT Interval:    QTC Calculation:   R Axis:     Text Interpretation:           Imaging Studies ordered: I ordered imaging studies including chest x-ray I independently visualized and interpreted imaging. I agree with the radiologist interpretation   Medicines ordered and prescription drug management: No orders of the defined types were placed in this encounter.   -I have reviewed the patients home medicines and have made adjustments as needed  Critical interventions O2 supplementation, multiple antibiotics  Cardiac Monitoring: The patient was maintained on a cardiac monitor.  I personally viewed and interpreted the cardiac monitored which showed an underlying rhythm of: Sinus tachycardia  Social Determinants of Health:  Factors impacting patients care include: none   Reevaluation: After the interventions noted above, I reevaluated the patient and found that they have :improved  Co morbidities that complicate the patient evaluation  Past Medical History:  Diagnosis Date   Allergic rhinitis    Anxiety     Asthma    Atrial fibrillation (HCC)    CAD (coronary artery disease)    CHF (congestive heart failure) ([place])    Depression    Diverticulosis 03/16/1995   DJD (degenerative joint  disease)    "most joints" (03/11/2015)   GERD (gastroesophageal reflux disease) 12/30/2000   Heart attack ([place]) 1984   Hiatal hernia 12/30/2000   History of blood transfusion 1988   "related to GI bleeding OR"   History of duodenal ulcer 10/15/1986   Hyperlipidemia    Hypertension    Sepsis ([place]) 12/2018   Sinus bradycardia    Stroke [facility])       Dispostion: I considered admission for this patient, and due to his hypoxia in the setting of an influenza infection, patient will be admitted.     Final Clinical Impression(s) / ED Diagnoses Final diagnoses:  None     _0 @    Teressa Lower, MD 03/04/21 [phone]

## 2021-03-04 NOTE — ED Triage Notes (Signed)
Pt BIB [facility] from home d/t SOB/Covid. Per EMS, pt tested positive for Covid using the home testing kit. Upon EMS arrival pt's sat was at 86% RA, EMS applied 4L. Pt is A&O x4. Vitals signs stable w EMS.

## 2021-03-05 ENCOUNTER — Other Ambulatory Visit: Payer: Self-pay

## 2021-03-05 ENCOUNTER — Encounter (HOSPITAL_COMMUNITY): Payer: Self-pay | Admitting: Internal Medicine

## 2021-03-05 DIAGNOSIS — I5032 Chronic diastolic (congestive) heart failure: Secondary | ICD-10-CM | POA: Diagnosis present

## 2021-03-05 DIAGNOSIS — J45901 Unspecified asthma with (acute) exacerbation: Secondary | ICD-10-CM | POA: Diagnosis present

## 2021-03-05 DIAGNOSIS — R509 Fever, unspecified: Secondary | ICD-10-CM | POA: Diagnosis present

## 2021-03-05 DIAGNOSIS — N401 Enlarged prostate with lower urinary tract symptoms: Secondary | ICD-10-CM | POA: Diagnosis present

## 2021-03-05 DIAGNOSIS — J9601 Acute respiratory failure with hypoxia: Secondary | ICD-10-CM | POA: Diagnosis not present

## 2021-03-05 DIAGNOSIS — Z8546 Personal history of malignant neoplasm of prostate: Secondary | ICD-10-CM | POA: Diagnosis not present

## 2021-03-05 DIAGNOSIS — Z9079 Acquired absence of other genital organ(s): Secondary | ICD-10-CM | POA: Diagnosis not present

## 2021-03-05 DIAGNOSIS — Z79899 Other long term (current) drug therapy: Secondary | ICD-10-CM | POA: Diagnosis not present

## 2021-03-05 DIAGNOSIS — J1008 Influenza due to other identified influenza virus with other specified pneumonia: Secondary | ICD-10-CM | POA: Diagnosis not present

## 2021-03-05 DIAGNOSIS — I361 Nonrheumatic tricuspid (valve) insufficiency: Secondary | ICD-10-CM | POA: Diagnosis not present

## 2021-03-05 DIAGNOSIS — K219 Gastro-esophageal reflux disease without esophagitis: Secondary | ICD-10-CM | POA: Diagnosis not present

## 2021-03-05 DIAGNOSIS — Z8673 Personal history of transient ischemic attack (TIA), and cerebral infarction without residual deficits: Secondary | ICD-10-CM | POA: Diagnosis not present

## 2021-03-05 DIAGNOSIS — R351 Nocturia: Secondary | ICD-10-CM | POA: Diagnosis present

## 2021-03-05 DIAGNOSIS — R9431 Abnormal electrocardiogram [ECG] [EKG]: Secondary | ICD-10-CM

## 2021-03-05 DIAGNOSIS — E785 Hyperlipidemia, unspecified: Secondary | ICD-10-CM | POA: Diagnosis present

## 2021-03-05 DIAGNOSIS — J309 Allergic rhinitis, unspecified: Secondary | ICD-10-CM | POA: Diagnosis present

## 2021-03-05 DIAGNOSIS — I272 Pulmonary hypertension, unspecified: Secondary | ICD-10-CM | POA: Diagnosis not present

## 2021-03-05 DIAGNOSIS — F411 Generalized anxiety disorder: Secondary | ICD-10-CM | POA: Diagnosis present

## 2021-03-05 DIAGNOSIS — Z20822 Contact with and (suspected) exposure to covid-19: Secondary | ICD-10-CM | POA: Diagnosis present

## 2021-03-05 DIAGNOSIS — I480 Paroxysmal atrial fibrillation: Secondary | ICD-10-CM | POA: Diagnosis not present

## 2021-03-05 DIAGNOSIS — I252 Old myocardial infarction: Secondary | ICD-10-CM | POA: Diagnosis not present

## 2021-03-05 DIAGNOSIS — I2510 Atherosclerotic heart disease of native coronary artery without angina pectoris: Secondary | ICD-10-CM | POA: Diagnosis present

## 2021-03-05 DIAGNOSIS — I110 Hypertensive heart disease with heart failure: Secondary | ICD-10-CM | POA: Diagnosis not present

## 2021-03-05 DIAGNOSIS — E871 Hypo-osmolality and hyponatremia: Secondary | ICD-10-CM | POA: Diagnosis not present

## 2021-03-05 DIAGNOSIS — Z7901 Long term (current) use of anticoagulants: Secondary | ICD-10-CM | POA: Diagnosis not present

## 2021-03-05 DIAGNOSIS — J154 Pneumonia due to other streptococci: Secondary | ICD-10-CM | POA: Diagnosis present

## 2021-03-05 LAB — CBC
HCT: 37.9 % — ABNORMAL LOW (ref 39.0–52.0)
Hemoglobin: 12.4 g/dL — ABNORMAL LOW (ref 13.0–17.0)
MCH: 31.5 pg (ref 26.0–34.0)
MCHC: 32.7 g/dL (ref 30.0–36.0)
MCV: 96.2 fL (ref 80.0–100.0)
Platelets: 150 10*3/uL (ref 150–400)
RBC: 3.94 MIL/uL — ABNORMAL LOW (ref 4.22–5.81)
RDW: 13.7 % (ref 11.5–15.5)
WBC: 14.7 10*3/uL — ABNORMAL HIGH (ref 4.0–10.5)
nRBC: 0 % (ref 0.0–0.2)

## 2021-03-05 LAB — BASIC METABOLIC PANEL
Anion gap: 10 (ref 5–15)
BUN: 17 mg/dL (ref 8–23)
CO2: 21 mmol/L — ABNORMAL LOW (ref 22–32)
Calcium: 7.5 mg/dL — ABNORMAL LOW (ref 8.9–10.3)
Chloride: 104 mmol/L (ref 98–111)
Creatinine, Ser: 1.34 mg/dL — ABNORMAL HIGH (ref 0.61–1.24)
GFR, Estimated: 51 mL/min — ABNORMAL LOW (ref >60)
Glucose, Bld: 137 mg/dL — ABNORMAL HIGH (ref 70–99)
Potassium: 3.6 mmol/L (ref 3.5–5.1)
Sodium: 135 mmol/L (ref 135–145)

## 2021-03-05 LAB — MRSA NEXT GEN BY PCR, NASAL: MRSA by PCR Next Gen: NOT DETECTED

## 2021-03-05 LAB — STREP PNEUMONIAE URINARY ANTIGEN: Strep Pneumo Urinary Antigen: POSITIVE — AB

## 2021-03-05 LAB — RESP PANEL BY RT-PCR (FLU A&B, COVID) ARPGX2
Influenza A by PCR: POSITIVE — AB
Influenza B by PCR: NEGATIVE
SARS Coronavirus 2 by RT PCR: NEGATIVE

## 2021-03-05 LAB — TROPONIN I (HIGH SENSITIVITY): Troponin I (High Sensitivity): 39 ng/L — ABNORMAL HIGH (ref <18)

## 2021-03-05 MED ORDER — MENTHOL 3 MG MT LOZG
1.0000 | LOZENGE | OROMUCOSAL | Status: DC | PRN
  Filled 2021-03-05: qty 9

## 2021-03-05 MED ORDER — LEVALBUTEROL HCL 0.63 MG/3ML IN NEBU
0.6300 mg | INHALATION_SOLUTION | Freq: Four times a day (QID) | RESPIRATORY_TRACT | Status: DC
  Administered 2021-03-05 – 2021-03-06 (×5): 0.63 mg via RESPIRATORY_TRACT
  Filled 2021-03-05 (×5): qty 3

## 2021-03-05 MED ORDER — METHYLPREDNISOLONE SODIUM SUCC 125 MG IJ SOLR
125.0000 mg | Freq: Once | INTRAMUSCULAR | Status: AC
  Administered 2021-03-05: 125 mg via INTRAVENOUS
  Filled 2021-03-05: qty 2

## 2021-03-05 MED ORDER — OSELTAMIVIR PHOSPHATE 30 MG PO CAPS
30.0000 mg | ORAL_CAPSULE | Freq: Two times a day (BID) | ORAL | Status: DC
  Administered 2021-03-05 – 2021-03-07 (×5): 30 mg via ORAL
  Filled 2021-03-05 (×7): qty 1

## 2021-03-05 MED ORDER — POTASSIUM CHLORIDE CRYS ER 20 MEQ PO TBCR
40.0000 meq | EXTENDED_RELEASE_TABLET | Freq: Once | ORAL | Status: AC
  Administered 2021-03-05: 40 meq via ORAL
  Filled 2021-03-05: qty 2

## 2021-03-05 MED ORDER — LORAZEPAM 1 MG PO TABS
0.5000 mg | ORAL_TABLET | Freq: Every evening | ORAL | Status: DC | PRN
  Administered 2021-03-05 – 2021-03-06 (×2): 1 mg via ORAL
  Filled 2021-03-05 (×2): qty 1

## 2021-03-05 MED ORDER — IPRATROPIUM BROMIDE 0.02 % IN SOLN
0.5000 mg | Freq: Four times a day (QID) | RESPIRATORY_TRACT | Status: DC
  Administered 2021-03-05 – 2021-03-06 (×6): 0.5 mg via RESPIRATORY_TRACT
  Filled 2021-03-05 (×5): qty 2.5

## 2021-03-05 MED ORDER — METOPROLOL TARTRATE 25 MG PO TABS: 25.0000 mg | ORAL_TABLET | Freq: Every day | ORAL | Status: DC | PRN

## 2021-03-05 MED ORDER — CALCIUM GLUCONATE-NACL 1-0.675 GM/50ML-% IV SOLN
1.0000 g | Freq: Once | INTRAVENOUS | Status: AC
  Administered 2021-03-05: 1000 mg via INTRAVENOUS
  Filled 2021-03-05: qty 50

## 2021-03-05 NOTE — ED Notes (Signed)
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 Yes   Triage Complete: Triage complete  Chief Complaint Acute respiratory failure with hypoxia ([place]) [J96.01] Fever [R50.9]  Triage Note Pt BIB [facility] from home d/t SOB/Covid. Per EMS, pt tested positive for Covid using the home testing kit. Upon EMS arrival pt's sat was at 86% RA, EMS applied 4L. Pt is A&O x4. Vitals signs stable w EMS.    Allergies Allergies  Allergen Reactions   Tylenol [Acetaminophen] Rash   Azithromycin    Advil [Ibuprofen] Nausea Only and Rash   Asa [Aspirin] Rash    Level of Care/Admitting Diagnosis ED Disposition     ED Disposition  Admit   Condition  --   Comment  Hospital Area: [place] [[id number]]  Level of Care: Telemetry Cardiac [103]  May admit patient to [facility] or [facility] if equivalent level of care is available:: No  Covid Evaluation: Symptomatic Person Under Investigation (PUI)  Diagnosis: Fever [517616]  Admitting Physician: Geradine Girt [place]  Attending Physician: Geradine Girt [[id number]]  Estimated length of stay: past midnight tomorrow  Certification:: I certify this patient will need inpatient services for at least 2 midnights          B Medical/Surgery History Past Medical History:  Diagnosis Date   Allergic rhinitis    Anxiety    Asthma    Atrial fibrillation ([place])    CAD (coronary artery disease)    CHF (congestive heart failure) ([place])    Depression    Diverticulosis 03/16/1995   DJD (degenerative joint disease)    "most joints" (03/11/2015)   GERD (gastroesophageal reflux disease) 12/30/2000   Heart attack ([place]) 1984   Hiatal hernia 12/30/2000   History of blood transfusion 1988   "related to GI bleeding  OR"   History of duodenal ulcer 10/15/1986   Hyperlipidemia    Hypertension    Sepsis ([place]) 12/2018   Sinus bradycardia    Stroke [facility])    Past Surgical History:  Procedure Laterality Date   CARDIAC CATHETERIZATION     CATARACT EXTRACTION W/ INTRAOCULAR LENS  IMPLANT, BILATERAL Bilateral ?2013   [place]  2010   hospital ccu  1984   heart attack, hypoplastic right coronary   INGUINAL HERNIA REPAIR Right ~ [place] Bilateral 06/10/2017   Procedure: BILATERAL ORCHIECTOMY;  Surgeon: Franchot Gallo, MD;  Location: [facility];  Service: Urology;  Laterality: Bilateral;  MAC ANESTHESIA AND LOCAL   TONSILLECTOMY     TOTAL KNEE ARTHROPLASTY Bilateral 1998-2004   "right-left"   VAGOTOMY AND PYLOROPLASTY  1988   "bleeding duodenal ulcer"     A IV Location/Drains/Wounds Patient Lines/Drains/Airways Status     Active Line/Drains/Airways     Name Placement date Placement time Site Days   Peripheral IV 03/04/21 20 G 1.16" Anterior;Right Forearm 03/04/21  2214  Forearm  1   External Urinary Catheter 03/05/21  0821  --  less than 1   Incision (Closed) 06/10/17 Other (Comment) 06/10/17  1238  -- 1364            Intake/Output Last 24 hours  Intake/Output Summary (  Last 24 hours) at 03/05/2021 1537 Last data filed at 03/05/2021 1357 Gross per 24 hour  Intake 150.74 ml  Output --  Net 150.74 ml    Labs/Imaging Results for orders placed or performed during the hospital encounter of 03/04/21 (from the past 48 hour(s))  Comprehensive metabolic panel     Status: Abnormal   Collection Time: 03/04/21  9:51 PM  Result Value Ref Range   Sodium 132 (L) 135 - 145 mmol/L   Potassium 4.0 3.5 - 5.1 mmol/L   Chloride 105 98 - 111 mmol/L   CO2 18 (L) 22 - 32 mmol/L   Glucose, Bld 175 (H) 70 - 99 mg/dL    Comment: Glucose reference range applies only to samples taken after fasting for at least 8  hours.   BUN 18 8 - 23 mg/dL   Creatinine, Ser 1.30 (H) 0.61 - 1.24 mg/dL   Calcium 7.7 (L) 8.9 - 10.3 mg/dL   Total Protein 6.2 (L) 6.5 - 8.1 g/dL   Albumin 3.4 (L) 3.5 - 5.0 g/dL   AST 25 15 - 41 U/L   ALT 23 0 - 44 U/L   Alkaline Phosphatase 69 38 - 126 U/L   Total Bilirubin 0.9 0.3 - 1.2 mg/dL   GFR, Estimated 53 (L) >60 mL/min    Comment: (NOTE) Calculated using the CKD-EPI Creatinine Equation (2021)    Anion gap 9 5 - 15    Comment: Performed at [facility], [place] [address] [place], [place] [address]  CBC with Differential     Status: Abnormal   Collection Time: 03/04/21  9:51 PM  Result Value Ref Range   WBC 13.1 (H) 4.0 - 10.5 K/uL   RBC 4.04 (L) 4.22 - 5.81 MIL/uL   Hemoglobin 12.4 (L) 13.0 - 17.0 g/dL   HCT 38.5 (L) 39.0 - 52.0 %   MCV 95.3 80.0 - 100.0 fL   MCH 30.7 26.0 - 34.0 pg   MCHC 32.2 30.0 - 36.0 g/dL   RDW 13.6 11.5 - 15.5 %   Platelets 148 (L) 150 - 400 K/uL   nRBC 0.0 0.0 - 0.2 %   Neutrophils Relative % 89 %   Neutro Abs 11.7 (H) 1.7 - 7.7 K/uL   Lymphocytes Relative 2 %   Lymphs Abs 0.3 (L) 0.7 - 4.0 K/uL   Monocytes Relative 8 %   Monocytes Absolute 1.0 0.1 - 1.0 K/uL   Eosinophils Relative 0 %   Eosinophils Absolute 0.0 0.0 - 0.5 K/uL   Basophils Relative 0 %   Basophils Absolute 0.0 0.0 - 0.1 K/uL   Immature Granulocytes 1 %   Abs Immature Granulocytes 0.07 0.00 - 0.07 K/uL    Comment: Performed at [facility], [place] [address], [place], [place] [address]  Troponin I (High Sensitivity)     Status: None   Collection Time: 03/04/21  9:51 PM  Result Value Ref Range   Troponin I (High Sensitivity) 16 <18 ng/L    Comment: (NOTE) Elevated high sensitivity troponin I (hsTnI) values and significant  changes across serial measurements may suggest ACS but many other  chronic and acute conditions are known to elevate hsTnI results.  Refer to the "Links" section for chest pain algorithms and additional  guidance. Performed at [facility], [place] [address] [place], [place] [address]   Resp Panel by RT-PCR (Flu A&B, Covid) Nasopharyngeal Swab     Status: Abnormal   Collection Time: 03/04/21  9:51 PM   Specimen: Nasopharyngeal Swab; Nasopharyngeal(NP) swabs in vial transport medium  Result Value Ref Range   SARS Coronavirus 2 by RT PCR NEGATIVE NEGATIVE    Comment: (NOTE) SARS-CoV-2 target nucleic acids are NOT DETECTED.  The SARS-CoV-2 RNA is generally detectable in upper respiratory specimens during the acute phase of infection. The lowest concentration of SARS-CoV-2 viral copies this assay can detect is 138 copies/mL. A negative result does not preclude SARS-Cov-2 infection and should not be used as the sole basis for treatment or other patient management decisions. A negative result may occur with  improper specimen collection/handling, submission of specimen other than nasopharyngeal swab, presence of viral mutation(s) within the areas targeted by this assay, and inadequate number of viral copies(<138 copies/mL). A negative result must be combined with clinical observations, patient history, and epidemiological information. The expected result is Negative.  Fact Sheet for Patients:  EntrepreneurPulse.com.au  Fact Sheet for Healthcare Providers:  IncredibleEmployment.be  This test is no t yet approved or cleared by the [place] FDA and  has been authorized for detection and/or diagnosis of SARS-CoV-2 by FDA under an Emergency Use Authorization (EUA). This EUA will remain  in effect (meaning this test can be used) for the duration of the COVID-19 declaration under Section 564(b)(1) of the Act, 21 U.S.C.section 360bbb-3(b)(1), unless the authorization is terminated  or revoked sooner.       Influenza A by PCR POSITIVE (A) NEGATIVE   Influenza B by PCR NEGATIVE NEGATIVE    Comment: (NOTE) The Xpert Xpress SARS-CoV-2/FLU/RSV plus assay is intended as an aid in  the diagnosis of influenza from Nasopharyngeal swab specimens and should not be used as a sole basis for treatment. Nasal washings and aspirates are unacceptable for Xpert Xpress SARS-CoV-2/FLU/RSV testing.  Fact Sheet for Patients: EntrepreneurPulse.com.au  Fact Sheet for Healthcare Providers: IncredibleEmployment.be  This test is not yet approved or cleared by the [place] FDA and has been authorized for detection and/or diagnosis of SARS-CoV-2 by FDA under an Emergency Use Authorization (EUA). This EUA will remain in effect (meaning this test can be used) for the duration of the COVID-19 declaration under Section 564(b)(1) of the Act, 21 U.S.C. section 360bbb-3(b)(1), unless the authorization is terminated or revoked.  Performed at [facility], [place] [address], [place], [place] [address]   I-Stat venous blood gas, ED     Status: Abnormal   Collection Time: 03/04/21 10:43 PM  Result Value Ref Range   pH, Ven 7.509 (H) 7.250 - 7.430   pCO2, Ven 29.0 (L) 44.0 - 60.0 mmHg   pO2, Ven 146.0 (H) 32.0 - 45.0 mmHg   Bicarbonate 23.1 20.0 - 28.0 mmol/L   TCO2 24 22 - 32 mmol/L   O2 Saturation 99.0 %   Acid-Base Excess 1.0 0.0 - 2.0 mmol/L   Sodium 135 135 - 145 mmol/L   Potassium 4.4 3.5 - 5.1 mmol/L   Calcium, Ion 0.97 (L) 1.15 - 1.40 mmol/L   HCT 36.0 (L) 39.0 - 52.0 %   Hemoglobin 12.2 (L) 13.0 - 17.0 g/dL   Sample type VENOUS   Troponin I (High Sensitivity)     Status: Abnormal   Collection Time: 03/04/21 11:51 PM  Result Value Ref Range   Troponin I (High Sensitivity) 39 (H) <18 ng/L    Comment: RESULT CALLED TO, READ BACK BY AND VERIFIED WITH: SINES T,RN 03/05/21 0104 WAYK Performed at [facility], [address], [place], [place] [address]  Basic metabolic panel     Status: Abnormal   Collection Time: 03/05/21 12:51 AM  Result Value Ref Range   Sodium 135 135 - 145 mmol/L   Potassium 3.6 3.5 - 5.1 mmol/L     Comment: DELTA CHECK NOTED   Chloride 104 98 - 111 mmol/L   CO2 21 (L) 22 - 32 mmol/L   Glucose, Bld 137 (H) 70 - 99 mg/dL    Comment: Glucose reference range applies only to samples taken after fasting for at least 8 hours.   BUN 17 8 - 23 mg/dL   Creatinine, Ser 1.34 (H) 0.61 - 1.24 mg/dL   Calcium 7.5 (L) 8.9 - 10.3 mg/dL   GFR, Estimated 51 (L) >60 mL/min    Comment: (NOTE) Calculated using the CKD-EPI Creatinine Equation (2021)    Anion gap 10 5 - 15    Comment: Performed at [facility] [address], [place], [place] [address]  CBC     Status: Abnormal   Collection Time: 03/05/21 12:51 AM  Result Value Ref Range   WBC 14.7 (H) 4.0 - 10.5 K/uL   RBC 3.94 (L) 4.22 - 5.81 MIL/uL   Hemoglobin 12.4 (L) 13.0 - 17.0 g/dL   HCT 37.9 (L) 39.0 - 52.0 %   MCV 96.2 80.0 - 100.0 fL   MCH 31.5 26.0 - 34.0 pg   MCHC 32.7 30.0 - 36.0 g/dL   RDW 13.7 11.5 - 15.5 %   Platelets 150 150 - 400 K/uL   nRBC 0.0 0.0 - 0.2 %    Comment: Performed at [facility], [place] [address] [place], [place] [address]  Strep pneumoniae urinary antigen     Status: Abnormal   Collection Time: 03/05/21 10:52 AM  Result Value Ref Range   Strep Pneumo Urinary Antigen POSITIVE (A) NEGATIVE    Comment: Performed at [facility] [address] [place],  [address]  Resp Panel by RT-PCR (Flu A&B, Covid) Nasopharyngeal Swab     Status: Abnormal   Collection Time: 03/05/21 10:52 AM   Specimen: Nasopharyngeal Swab; Nasopharyngeal(NP) swabs in vial transport medium  Result Value Ref Range   SARS Coronavirus 2 by RT PCR NEGATIVE NEGATIVE    Comment: (NOTE) SARS-CoV-2 target nucleic acids are NOT DETECTED.  The SARS-CoV-2 RNA is generally detectable in upper respiratory specimens during the acute phase of infection. The lowest concentration of SARS-CoV-2 viral copies this assay can detect is 138 copies/mL. A negative result does not preclude SARS-Cov-2 infection and should not be used as  the sole basis for treatment or other patient management decisions. A negative result may occur with  improper specimen collection/handling, submission of specimen other than nasopharyngeal swab, presence of viral mutation(s) within the areas targeted by this assay, and inadequate number of viral copies(<138 copies/mL). A negative result must be combined with clinical observations, patient history, and epidemiological information. The expected result is Negative.  Fact Sheet for Patients:  EntrepreneurPulse.com.au  Fact Sheet for Healthcare Providers:  IncredibleEmployment.be  This test is no t yet approved or cleared by the [place] FDA and  has been authorized for detection and/or diagnosis of SARS-CoV-2 by FDA under an Emergency Use Authorization (EUA). This EUA will remain  in effect (meaning this test can be used) for the duration of the COVID-19 declaration under Section 564(b)(1) of the Act, 21 U.S.C.section 360bbb-3(b)(1), unless the authorization is terminated  or revoked sooner.       Influenza A  by PCR POSITIVE (A) NEGATIVE   Influenza B by PCR NEGATIVE NEGATIVE    Comment: (NOTE) The Xpert Xpress SARS-CoV-2/FLU/RSV plus assay is intended as an aid in the diagnosis of influenza from Nasopharyngeal swab specimens and should not be used as a sole basis for treatment. Nasal washings and aspirates are unacceptable for Xpert Xpress SARS-CoV-2/FLU/RSV testing.  Fact Sheet for Patients: EntrepreneurPulse.com.au  Fact Sheet for Healthcare Providers: IncredibleEmployment.be  This test is not yet approved or cleared by the [place] FDA and has been authorized for detection and/or diagnosis of SARS-CoV-2 by FDA under an Emergency Use Authorization (EUA). This EUA will remain in effect (meaning this test can be used) for the duration of the COVID-19 declaration under Section 564(b)(1) of the  Act, 21 U.S.C. section 360bbb-3(b)(1), unless the authorization is terminated or revoked.  Performed at [facility], [place] [address] [place], [place] [address]    DG Chest Portable 1 View  Result Date: 03/04/2021 CLINICAL DATA:  Evaluate for pneumonia. EXAM: PORTABLE CHEST 1 VIEW COMPARISON:  Chest x-ray 12/04/2020. FINDINGS: The aorta is ectatic. Cardiac silhouette is within normal limits. There are minimal patchy opacities in the medial right lung base. There are strandy opacities in the left lung base favored is atelectasis or scarring. There is no pleural effusion or pneumothorax. No acute fractures are seen. IMPRESSION: 1. Minimal patchy opacities in the medial right lung base worrisome for infection or aspiration. Followup PA and lateral chest X-ray is recommended in 3-4 weeks following trial of antibiotic therapy to ensure resolution and exclude underlying malignancy. Electronically Signed   By: Ronney Asters M.D.   On: 03/04/2021 22:58    Pending Labs Unresulted Labs (From admission, onward)     Start     Ordered   03/06/21 0500  Magnesium  Tomorrow morning,   R        03/05/21 1153   03/06/21 0500  CBC  Tomorrow morning,   R        03/05/21 1153   03/06/21 9371  Basic metabolic panel  Tomorrow morning,   R        03/05/21 1153   03/04/21 2333  Legionella Pneumophila Serogp 1 Ur Ag  (COPD / Pneumonia / Cellulitis / Lower Extremity Wound)  Once,   R        03/04/21 2333   03/04/21 2231  Blood gas, venous (at [facility] and [facility], not at [facility])  ONCE - STAT,   STAT        03/04/21 2230            Vitals/Pain Today's Vitals   03/05/21 1200 03/05/21 1300 03/05/21 1400 03/05/21 1500  BP: 120/70 117/61 (!) 138/59 124/67  Pulse: 100 94 94 95  Resp: 14 (!) _0 Temp:      TempSrc:      SpO2: 98% 98% 99% 98%  Weight:      Height:      PainSc:        Isolation Precautions Droplet precaution  Medications Medications  dofetilide (TIKOSYN) capsule 250 mcg (250 mcg Oral  Given 03/05/21 0944)  nitroGLYCERIN (NITROSTAT) SL tablet 0.4 mg (has no administration in time range)  atorvastatin (LIPITOR) tablet 20 mg (20 mg Oral Given 03/05/21 0938)  buPROPion (WELLBUTRIN SR) 12 hr tablet 150 mg (150 mg Oral Given 03/05/21 0939)  FLUoxetine (PROZAC) capsule 10 mg (10 mg Oral Given 03/05/21 0939)  pantoprazole (PROTONIX) EC tablet 40 mg (40 mg  Oral Given 03/05/21 0938)  tamsulosin (FLOMAX) capsule 0.4 mg (0.4 mg Oral Given 03/05/21 0938)  apixaban (ELIQUIS) tablet 5 mg (5 mg Oral Given 03/05/21 0937)  budesonide (PULMICORT) nebulizer solution 0.25 mg (0.25 mg Inhalation Given 03/05/21 0850)  albuterol (PROVENTIL) (2.5 MG/3ML) 0.083% nebulizer solution 2.5 mg (has no administration in time range)  cefTRIAXone (ROCEPHIN) 2 g in sodium chloride 0.9 % 100 mL IVPB (0 g Intravenous Stopped 03/05/21 1016)  sodium chloride flush (NS) 0.9 % injection 3 mL (3 mLs Intravenous Given 03/05/21 0942)  predniSONE (DELTASONE) tablet 50 mg (50 mg Oral Given 03/05/21 0850)  metoprolol tartrate (LOPRESSOR) tablet 25 mg (has no administration in time range)  LORazepam (ATIVAN) tablet 0.5-1 mg (has no administration in time range)  levalbuterol (XOPENEX) nebulizer solution 0.63 mg (0.63 mg Nebulization Given 03/05/21 1016)  ipratropium (ATROVENT) nebulizer solution 0.5 mg (0.5 mg Nebulization Given 03/05/21 1030)  oseltamivir (TAMIFLU) capsule 30 mg (30 mg Oral Given 03/05/21 1105)  menthol-cetylpyridinium (CEPACOL) lozenge 3 mg (has no administration in time range)  ipratropium-albuterol (DUONEB) 0.5-2.5 (3) MG/3ML nebulizer solution 3 mL (3 mLs Nebulization Given 03/04/21 2231)  cefTRIAXone (ROCEPHIN) 1 g in sodium chloride 0.9 % 100 mL IVPB (0 g Intravenous Stopped 03/04/21 2335)  methylPREDNISolone sodium succinate (SOLU-MEDROL) 125 mg/2 mL injection 125 mg (125 mg Intravenous Given 03/05/21 0038)  potassium chloride SA (KLOR-CON M) CR tablet 40 mEq (40 mEq Oral Given 03/05/21 0937)  calcium gluconate 1  g/ 50 mL sodium chloride IVPB (0 mg Intravenous Stopped 03/05/21 1357)    Mobility walks Low fall risk     R Recommendations: See Admitting Provider Note  Report given to: Lora Paula RN  Additional Notes:

## 2021-03-05 NOTE — Assessment & Plan Note (Addendum)
-  not on home O2 -wean as able -see above re: sats -down to 2L currently

## 2021-03-05 NOTE — Assessment & Plan Note (Addendum)
-  will re-test as at home he was COVID +-- retest shows flu A again and negative for COVID -start tamiflu for now as still with-in time frame

## 2021-03-05 NOTE — Assessment & Plan Note (Addendum)
-  IV abx started-- rocephin only due to Qtc -? Aspiration -strep positive -monitor Qtc daily and on tele -will need repeat x ray to ensure resolution in 3-4 weeks

## 2021-03-05 NOTE — Assessment & Plan Note (Signed)
-  steroids -nebs

## 2021-03-05 NOTE — Assessment & Plan Note (Addendum)
-  follows with Dr. Aundra Dubin -on tikosyn -monitor on tele -went into a fib this AM it appears -does not tolerate BB/cardiazem due to orthostatic hypotension -cards consult as last outpatient note mentioned amiodarone

## 2021-03-05 NOTE — Progress Notes (Signed)
°  Progress Note   Patient: [name] WEAKLAND, [name] [name] AJO:[id number] DOB: May 13, 1929 DOA: 03/04/2021     0 DOS: the patient was seen and examined on 03/05/2021   Brief hospital course: [name] Spence, [name] [name] is a 88 y.o. male with medical history significant of stroke, pulmonary hypertension, A. fib, intracerebral hematoma, hyperlipidemia, GERD, CHF, CAD status post PCI, BPH, bradycardia, anxiety, CKD, diverticular low cysts, tricuspid regurgitation who presents with cough and shortness of breath.  Patient has had ongoing cough and shortness of breath for the past few days.  To go home COVID test today which was positive.  He is continuing shortness of breath despite his home inhalers that he takes for asthma and called EMS.  Noted to be saturating in the high 80s on room air improved to 94% on 4 L.  Found to be Flu A positive here at the hospital.  Assessment and Plan * Acute respiratory failure with hypoxia ([place])- (present on admission) -not on home O2 -wean as able -see above re: sats  Prolonged QT interval Monitor on tele -replace electrolytes  Asthma, chronic, unspecified asthma severity, with acute exacerbation -steroids -nebs  Influenza A -will re-test as at home he was COVID + -start tamiflu for now as still with-in time frame for it to be effective  Lobar pneumonia (H[facility])-left lower lobs - (present on admission) -IV abx started-- rocephin only due to Qtc -? Aspiration -strep/legionella pending -monitor Qtc daily and on tele -will need repeat x ray to ensure resolution in 3-4 weeks  Paroxysmal atrial fibrillation ([place])- (present on admission) -follows with Dr. Aundra Dubin -monitor on tele    Subjective: overall feeling better  Objective Vitals:   03/05/21 0848 03/05/21 0935 03/05/21 1000 03/05/21 1100  BP:  128/68 137/67 133/72  Pulse:  (!) 109 (!) 107 (!) 113  Resp:  16 19 20   Temp: 97.8 F (36.6 C)     TempSrc: Oral     SpO2:  98% 99% 98%  Weight:      Height:          General: Appearance:    elderly male in no acute distress, hard of hearing     Lungs:     respirations unlabored, wheezing through out  Heart:    Tachycardic.   MS:   All extremities are intact.    Neurologic:   Awake, alert, oriented x 3. No apparent focal neurological           defect.      Data Reviewed: Troponin slightly elevated-- suspect demand, CR stable, calcium slight low as well  Family Communication: called wife  Disposition: Status is: Inpatient  Remains inpatient appropriate because: needs to be weaned off O2      Time spent: 35 minutes  Author: Geradine Girt, DO 03/05/2021 11:46 AM  For on call review [email or web address].

## 2021-03-05 NOTE — Hospital Course (Addendum)
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 fib, intracerebral hematoma, hyperlipidemia, GERD, CHF, CAD status post PCI, BPH, bradycardia, anxiety, CKD, diverticular low cysts, tricuspid regurgitation who presents with cough and shortness of breath.  Patient has had ongoing cough and shortness of breath for the past few days.  To go home COVID test today which was positive.  He is continuing shortness of breath despite his home inhalers that he takes for asthma and called EMS.  Noted to be saturating in the high 80s on room air improved to 94% on 4 L.  Found to be Flu A positive as well as strep pna +.  On AM of 1/27 went into a fib with RVR

## 2021-03-05 NOTE — Assessment & Plan Note (Signed)
Monitor on tele -replace electrolytes

## 2021-03-06 DIAGNOSIS — J9601 Acute respiratory failure with hypoxia: Secondary | ICD-10-CM

## 2021-03-06 DIAGNOSIS — I480 Paroxysmal atrial fibrillation: Secondary | ICD-10-CM

## 2021-03-06 LAB — BASIC METABOLIC PANEL
Anion gap: 7 (ref 5–15)
BUN: 23 mg/dL (ref 8–23)
CO2: 21 mmol/L — ABNORMAL LOW (ref 22–32)
Calcium: 7.9 mg/dL — ABNORMAL LOW (ref 8.9–10.3)
Chloride: 104 mmol/L (ref 98–111)
Creatinine, Ser: 1.18 mg/dL (ref 0.61–1.24)
GFR, Estimated: 59 mL/min — ABNORMAL LOW (ref >60)
Glucose, Bld: 139 mg/dL — ABNORMAL HIGH (ref 70–99)
Potassium: 4 mmol/L (ref 3.5–5.1)
Sodium: 132 mmol/L — ABNORMAL LOW (ref 135–145)

## 2021-03-06 LAB — LEGIONELLA PNEUMOPHILA SEROGP 1 UR AG: L. pneumophila Serogp 1 Ur Ag: NEGATIVE

## 2021-03-06 LAB — CBC
HCT: 33.6 % — ABNORMAL LOW (ref 39.0–52.0)
Hemoglobin: 10.9 g/dL — ABNORMAL LOW (ref 13.0–17.0)
MCH: 29.9 pg (ref 26.0–34.0)
MCHC: 32.4 g/dL (ref 30.0–36.0)
MCV: 92.3 fL (ref 80.0–100.0)
Platelets: 152 10*3/uL (ref 150–400)
RBC: 3.64 MIL/uL — ABNORMAL LOW (ref 4.22–5.81)
RDW: 13.9 % (ref 11.5–15.5)
WBC: 18.9 10*3/uL — ABNORMAL HIGH (ref 4.0–10.5)
nRBC: 0 % (ref 0.0–0.2)

## 2021-03-06 LAB — EXPECTORATED SPUTUM ASSESSMENT W GRAM STAIN, RFLX TO RESP C

## 2021-03-06 LAB — MAGNESIUM: Magnesium: 1.7 mg/dL (ref 1.7–2.4)

## 2021-03-06 MED ORDER — LEVALBUTEROL HCL 0.63 MG/3ML IN NEBU
0.6300 mg | INHALATION_SOLUTION | Freq: Three times a day (TID) | RESPIRATORY_TRACT | Status: DC
  Administered 2021-03-06 – 2021-03-07 (×3): 0.63 mg via RESPIRATORY_TRACT
  Filled 2021-03-06 (×3): qty 3

## 2021-03-06 MED ORDER — IPRATROPIUM BROMIDE 0.02 % IN SOLN
0.5000 mg | Freq: Three times a day (TID) | RESPIRATORY_TRACT | Status: DC
  Administered 2021-03-06 – 2021-03-07 (×3): 0.5 mg via RESPIRATORY_TRACT
  Filled 2021-03-06 (×4): qty 2.5

## 2021-03-06 MED ORDER — MAGNESIUM SULFATE 4 GM/100ML IV SOLN
4.0000 g | Freq: Once | INTRAVENOUS | Status: AC
  Administered 2021-03-06: 4 g via INTRAVENOUS
  Filled 2021-03-06: qty 100

## 2021-03-06 NOTE — Plan of Care (Signed)
°  Problem: Activity: Goal: Ability to tolerate increased activity will improve Outcome: Progressing   Problem: Respiratory: Goal: Ability to maintain adequate ventilation will improve Outcome: Progressing Goal: Ability to maintain a clear airway will improve Outcome: Progressing   Problem: Education: Goal: Knowledge of General Education information will improve Description: Including pain rating scale, medication(s)/side effects and non-pharmacologic comfort measures Outcome: Progressing   Problem: Clinical Measurements: Goal: Ability to maintain clinical measurements within normal limits will improve Outcome: Progressing Goal: Respiratory complications will improve Outcome: Progressing   Problem: Activity: Goal: Risk for activity intolerance will decrease Outcome: Progressing

## 2021-03-06 NOTE — Assessment & Plan Note (Signed)
-  follows with Dr. Eulogio Ditch

## 2021-03-06 NOTE — Consult Note (Addendum)
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 Eliseo Squires with [facility].   He is a retired Stage manager with CAD s/p Cx and paroxysmal atrial fibrillation.  He had an RV infarct with chronic total occlusion of a small nondominant RCA in the 1980s.  He had a drug-eluting stent to the CFX in 11/10. He is very symptomatic with atrial fibrillation episodes. He has had a chronic pattern of mild shortness of breath and mild left shoulder pain/chest aching when walking up a hill (x 20 years).    Echo in 7/16 showed normal LV systolic function, EF 48-27% with moderate diastolic dysfunction.  Moderate TR with PA systolic pressure 65 mmHg.  Normal RV size and systolic function.  Given volume overload on exam and abnormal echo, he was started on low dose Lasix in the past.     He was started on Multaq but continued to have episodes of atrial fibrillation.  This was stopped, and he was admitted for Tikosyn loading.  After he went home, he developed left facial droop and left arm weakness.  He was found to have a small right parietal cortical infarct with hemorrhagic conversion.  His symptoms resolved quickly. INR was subtherapeutic at admission.  Warfarin was stopped and he was put on apixaban.    In 9/17, he was admitted with LLL PNA.  During the admission, he had a couple of relatively short runs of atrial fibrillation.  He developed more frequent episodes of atrial fibrillation at home and was started on Tikosyn.    In 5/19, he had bilateral orchiectomy for metastatic prostate cancer.  His PSA has come down considerably.    In 1/20, he had influenza that progressed to PNA.  He was hospitalized with transient atrial fibrillation.     In 11/20, he was admitted with an E coli UTI and urosepsis with  hypotension and AKI.  He was initially fluid-resuscitated and later required diuresis.  He was in atrial fibrillation while in the hospital.    Seen in ED yesterday with complaints of cough and dyspnea. Tested positive for COVID on home kit. He was hypoxic with O2 sats 86% per EMS. Required supplemental O2 to maintain sats. Tested negative for COVID in ED but positive for influenza. CXR with patchy opacities in right lung base worrisome for possible aspiration pneumonia. Started on ceftriaxone and azithromycin. Started on duonebs and steroids. Admitted for management of acute respiratory failure with hypoxia. QTc interval prolonged at 505 ms on ECG yesterday. Azithromycin stopped. Went into atrial fibrillation this am with V rates 90s-100s.  Currently feels okay. O2 has been weaned down to 2L. Still with some wheezing. No palpitations or CP.  Lives at home and is independent in ADLs. Golfed three weeks ago and works out with a Clinical research associate twice a week.   Review of Systems: [y] = yes, _0  = no   General: Weight gain _1 ; Weight loss _2 ; Anorexia _3 ; Fatigue [Y]; Fever _4 ; Chills _5 ; Weakness _6   Cardiac: Chest pain/pressure _7 ; Resting SOB _8 ; Exertional SOB [Y ]; Orthopnea _9 ; Pedal Edema _10 ; Palpitations _11 ; Syncope _12 ; Presyncope _13 ; Paroxysmal nocturnal dyspnea_14   Pulmonary: Cough [Y]; Wheezing[Y]; Hemoptysis_15 ; Sputum _16 ; Snoring _17   GI: Vomiting_0 ; Dysphagia_1 ; Melena_2 ; Hematochezia _3 ; Heartburn_4 ; Abdominal pain _5 ; Constipation _6 ; Diarrhea _7 ; BRBPR _8   GU: Hematuria_9 ; Dysuria _10 ; Nocturia_11   Vascular: Pain in legs with walking _12 ; Pain in feet with lying flat _13 ; Non-healing sores _14 ; Stroke _15 ; TIA _16 ; Slurred speech _17 ;  Neuro: Headaches_18 ; Vertigo_19 ; Seizures_20 ; Paresthesias_21 ;Blurred vision _22 ; Diplopia _23 ; Vision changes _24   Ortho/Skin: Arthritis _25 ; Joint pain _26 ; Muscle pain _27 ; Joint swelling _28 ; Back Pain _29 ; Rash _30   Psych:  Depression_31 ; Anxiety_32   Heme: Bleeding problems _33 ; Clotting disorders _34 ; Anemia _35   Endocrine: Diabetes _36 ; Thyroid dysfunction_37   Home Medications Prior to Admission medications   Medication Sig Start Date End Date Taking? Authorizing Provider  albuterol (VENTOLIN HFA) 108 (90 Base) MCG/ACT inhaler Inhale 2 puffs into the lungs every 4 (four) hours as needed for wheezing or shortness of breath. 08/29/19  Yes Brand Males, MD  apixaban (ELIQUIS) 5 MG TABS tablet Take 1 tablet (5 mg total) by mouth 2 (two) times daily. 05/21/20  Yes Larey Dresser, MD  ARNUITY ELLIPTA 200 MCG/ACT AEPB INHALE 1 PUFF INTO THE LUNGS DAILY 01/16/21  Yes Brand Males, MD  atorvastatin (LIPITOR) 20 MG tablet Take 1 tablet by mouth once daily Patient taking differently: Take 20 mg by mouth daily. 12/22/20  Yes Burchette, Alinda Sierras, MD  buPROPion (WELLBUTRIN SR) 150 MG 12 hr tablet TAKE 1 TABLET(150 MG) BY MOUTH DAILY Patient taking differently: Take 150 mg by mouth daily. 01/14/21  Yes Burchette, Alinda Sierras, MD  cetirizine (ZYRTEC) 10 MG tablet Take 10 mg by mouth daily with supper.    Yes [provider]  Cholecalciferol (VITAMIN D) 2000 units tablet Take 2,000 Units by mouth daily.   Yes [provider]  dofetilide (TIKOSYN) 250 MCG capsule TAKE 1 CAPSULE(250 MCG) BY MOUTH TWICE DAILY Patient taking differently: Take 250 mcg by mouth 2 (two) times daily. 12/01/20  Yes Larey Dresser, MD  FLUoxetine (PROZAC) 10 MG capsule Take 1 capsule (10 mg total) by mouth daily. 01/14/21  Yes Burchette, Alinda Sierras, MD  fluticasone (FLONASE) 50 MCG/ACT nasal spray SHAKE LIQUID AND USE 2 SPRAYS IN EACH NOSTRIL DAILY Patient taking differently: Place 2 sprays into both nostrils daily. 09/19/19  Yes Ramaswamy, Belva Crome, MD  LORazepam (ATIVAN) 1 MG tablet TAKE 1/2 TO 1 TABLET BY MOUTH AT BEDTIME AS NEEDED Patient taking differently: Take 1 mg by mouth at bedtime as needed for sleep. TAKE 1/2 TO 1 TABLET BY  MOUTH AT BEDTIME AS NEEDED 01/06/21  Yes Burchette, Alinda Sierras, MD  metoprolol tartrate (LOPRESSOR) 25 MG tablet Take 1 tablet (25 mg total) by mouth daily as needed (for a heart rate greater than 100). 06/03/20 06/03/21 Yes Larey Dresser, MD  nitroGLYCERIN (NITROSTAT) 0.4 MG SL tablet Place 1 tablet (0.4 mg total) under the tongue every 5 (five) minutes x 3 doses as needed for chest pain. 01/04/17  Yes [name], MD  omeprazole (PRILOSEC OTC) 20 MG tablet Take 1 tablet (20 mg total) by mouth daily. 09/17/13  Yes [name], MD  tamsulosin (FLOMAX) 0.4 MG CAPS capsule TAKE 1 CAPSULE(0.4 MG) BY MOUTH DAILY Patient taking differently: Take 0.4 mg by mouth daily. 12/22/20  Yes Burchette, Alinda Sierras, MD    Past Medical History: Past Medical  History:  Diagnosis Date   Allergic rhinitis    Anxiety    Asthma    Atrial fibrillation ([place])    CAD (coronary artery disease)    CHF (congestive heart failure) ([place])    Depression    Diverticulosis 03/16/1995   DJD (degenerative joint disease)    "most joints" (03/11/2015)   GERD (gastroesophageal reflux disease) 12/30/2000   Heart attack ([place]) 1984   Hiatal hernia 12/30/2000   History of blood transfusion 1988   "related to GI bleeding OR"   History of duodenal ulcer 10/15/1986   Hyperlipidemia    Hypertension    Sepsis ([place]) 12/2018   Sinus bradycardia    Stroke [facility])     Past Surgical History: Past Surgical History:  Procedure Laterality Date   CARDIAC CATHETERIZATION     CATARACT EXTRACTION W/ INTRAOCULAR LENS  IMPLANT, BILATERAL Bilateral ?2013   [place] WITH STENT PLACEMENT  2010   hospital ccu  1984   heart attack, hypoplastic right coronary   INGUINAL HERNIA REPAIR Right ~ [place] Bilateral 06/10/2017   Procedure: BILATERAL ORCHIECTOMY;  Surgeon: Franchot Gallo, MD;  Location: [facility] ORS;  Service: Urology;  Laterality: Bilateral;  MAC ANESTHESIA AND  LOCAL   TONSILLECTOMY     TOTAL KNEE ARTHROPLASTY Bilateral 1998-2004   "right-left"   VAGOTOMY AND PYLOROPLASTY  1988   "bleeding duodenal ulcer"    Family History: Family History  Problem Relation Age of Onset   Coronary artery disease Mother        deceased   Deep vein thrombosis Sister    Coronary artery disease Brother    Asthma Brother    Colon cancer Neg Hx     Social History: Social History   Socioeconomic History   Marital status: Married    Spouse name: Not on file   Number of children: 2   Years of education: Not on file   Highest education level: Doctorate  Occupational History   Occupation: IT sales professional: RETIRED    Comment: radiologist  Tobacco Use   Smoking status: Never   Smokeless tobacco: Never  Vaping Use   Vaping Use: Never used  Substance and Sexual Activity   Alcohol use: No   Drug use: No   Sexual activity: Not Currently  Other Topics Concern   Not on file  Social History Narrative   Married. Regular exercise - yes.       03/26/2019: Married to Therapist, sports, lives in 3 story home, although lives on first level mostly. Has two grown children.      Enjoys golfing; drives to [place] with wife every winter for a few days, watches sports, following Merchandiser, retail, reads about WWII,    Social Determinants of Radio broadcast assistant Strain: Low Risk    Difficulty of Paying Living Expenses: Not hard at all  Food Insecurity: No Food Insecurity   Worried About Charity fundraiser in the Last Year: Never true   Arboriculturist in the Last Year: Never true  Transportation Needs: No Transportation Needs   Lack of Transportation (Medical): No   Lack of Transportation (Non-Medical): No  Physical Activity: Sufficiently Active   Days of Exercise per Week: 4 days   Minutes of Exercise per Session: 60 min  Stress: No Stress Concern Present   Feeling of Stress : Not at all  Social Connections: Moderately  Integrated   Frequency of Communication with  Friends and Family: More than three times a week   Frequency of Social Gatherings with Friends and Family: Three times a week   Attends Religious Services: More than 4 times per year   Active Member of Clubs or Organizations: No   Attends Archivist Meetings: Never   Marital Status: Married    Allergies:  Allergies  Allergen Reactions   Tylenol [Acetaminophen] Rash   Azithromycin    Advil [Ibuprofen] Nausea Only and Rash   Asa [Aspirin] Rash    Objective:    Vital Signs:   Temp:  [98 F (36.7 C)-98.3 F (36.8 C)] 98 F (36.7 C) (01/27 0859) Pulse Rate:  [87-113] 97 (01/27 0859) Resp:  [14-21] 19 (01/27 0859) BP: (111-138)/(53-78) 111/65 (01/27 0859) SpO2:  [96 %-100 %] 96 % (01/27 0859) Weight:  [66.5 kg] 66.5 kg (01/26 2040)    Weight change: Filed Weights   03/05/21 0823 03/05/21 2040  Weight: 70.9 kg 66.5 kg    Intake/Output:   Intake/Output Summary (Last 24 hours) at 03/06/2021 1010 Last data filed at 03/06/2021 0313 Gross per 24 hour  Intake 150.74 ml  Output 650 ml  Net -499.26 ml      Physical Exam    General:  No distress, lying comfortably in bed. HEENT: normal Neck: supple. No JVD. Carotids 2+ bilat; no bruits.  Cor: PMI nondisplaced. Irregular rhythm, mildly tachy. No rubs, gallops, + TR Lungs: Expiratory wheezes throughout Abdomen: soft, nontender, nondistended. No hepatosplenomegaly.  Extremities: no cyanosis, clubbing, rash, edema Neuro: alert & orientedx3, cranial nerves grossly intact. moves all 4 extremities w/o difficulty. Affect pleasant   Telemetry   AF 90s-low 100s  EKG    AF 104 bpm, Qtc 433 ms  Labs   Basic Metabolic Panel: Recent Labs  Lab 03/04/21 2151 03/04/21 2243 03/05/21 0051 03/06/21 0108  NA 132* 135 135 132*  K 4.0 4.4 3.6 4.0  CL 105  --  104 104  CO2 18*  --  21* 21*  GLUCOSE 175*  --  137* 139*  BUN 18  --  17 23  CREATININE 1.30*  --  1.34* 1.18  CALCIUM 7.7*  --  7.5* 7.9*  MG  --   --    --  1.7    Liver Function Tests: Recent Labs  Lab 03/04/21 2151  AST 25  ALT 23  ALKPHOS 69  BILITOT 0.9  PROT 6.2*  ALBUMIN 3.4*   No results for input(s): LIPASE, AMYLASE in the last 168 hours. No results for input(s): AMMONIA in the last 168 hours.  CBC: Recent Labs  Lab 03/04/21 2151 03/04/21 2243 03/05/21 0051 03/06/21 0108  WBC 13.1*  --  14.7* 18.9*  NEUTROABS 11.7*  --   --   --   HGB 12.4* 12.2* 12.4* 10.9*  HCT 38.5* 36.0* 37.9* 33.6*  MCV 95.3  --  96.2 92.3  PLT 148*  --  150 152    Cardiac Enzymes: No results for input(s): CKTOTAL, CKMB, CKMBINDEX, TROPONINI in the last 168 hours.  BNP: BNP (last 3 results) No results for input(s): BNP in the last 8760 hours.  ProBNP (last 3 results) No results for input(s): PROBNP in the last 8760 hours.   CBG: No results for input(s): GLUCAP in the last 168 hours.  Coagulation Studies: No results for input(s): LABPROT, INR in the last 72 hours.   Imaging   No results found.   Medications:  Current Medications:  apixaban  5 mg Oral BID   atorvastatin  20 mg Oral Daily   budesonide  0.25 mg Inhalation BID   buPROPion  150 mg Oral Daily   dofetilide  250 mcg Oral BID   FLUoxetine  10 mg Oral Daily   ipratropium  0.5 mg Nebulization Q6H   levalbuterol  0.63 mg Nebulization Q6H   oseltamivir  30 mg Oral BID   pantoprazole  40 mg Oral Daily   predniSONE  50 mg Oral Q breakfast   sodium chloride flush  3 mL Intravenous Q12H   tamsulosin  0.4 mg Oral Daily    Infusions:  cefTRIAXone (ROCEPHIN)  IV 2 g (03/06/21 8676)      Patient Profile   88 y.o. male with history of CAD s/p PCI, chronic diastolic CHF, tricuspid regurgitation, CKD IIIa, HLD. Admitted with acute respiratory failure with hypoxia secondary to influenza A/pneumonia and asthma exacerbation c/b recurrent AF.  Assessment/Plan  1. Atrial fibrillation: Paroxysmal.  Episodes are quite symptomatic (fatigue) and often triggered by  some form of physical stress, he feels "bad" with atrial fibrillation even when he's rate-controlled. He had frequent breakthrough on Multaq but and is now on Tikosyn.  He is now off diltiazem CD and atenolol due to low BP and orthostasis.  He had a lot of AF in October but much less in November/December.  - Now with recurrent AF in setting of acute respiratory failure d/t influenza A and pneumonia - Takes metoprolol PRN at home for palpitations. Does not tolerate daily BB or CCB d/t orthostasis - Continue Tikosyn.   - Rate okay this am, 90s - 100s. May need to consider DCCV if does not convert as acute illness resolves. - Continue Eliquis.  2. Acute respiratory failure with hypoxia: - Down to 2L O2 - In setting of influenza A and suspected pneumonia.  - Negative for COVID here, + on home kit - On course of Tamiflu - On rocephin for PNA, azithromycin stopped d/t Qtc 3. Prolonged Qtc: - Stable today at 433 - Agree with stopping azithromycin  4. CAD: He has chronic stable angina walking up a steep hill.  No change in pattern x 15 years.   - Continue medical management of angina, no indication for stress test or cath.  - He is on apixaban without ASA given stable CAD.   - Continue statin 5. Hyperlipidemia: Good lipids in 8/22 on atorvastatin 20 mg daily.  6. Chronic diastolic CHF:  Most recent echo in 8/19 showed EF 19-50% but PA systolic pressure was estimated at 60 mmHg and there was severe TR (progressed from moderate), the RV looked normal.   - Chronic NYHA II symptoms - He has wanted to hold off on repeat echo.  - Does not appear volume overloaded currently. Monitor for signs of volume overload with recurrent AF 7. Tricuspid regurgitation: Severe on 8/19 echo, he does have a TR murmur.  Not candidate for surgical TVR and not interested in referral to trial for percutaneous repair.   Length of Stay: 1  FINCH, LINDSAY N, PA-C  03/06/2021, 10:10 AM  Advanced Heart Failure Team Pager  [phone] (M-F; 7a - 5p)  Please contact [facility] for night-coverage after hours (4p -7a ) and weekends on [email or web address]   Patient seen with PA, agree with the above note.   He has been admitted with influenza A and Strep pneumonia PNA.  He went back into atrial fibrillation during this episode.  Rate is  in the 90s.  He remains on Tikosyn, QTc < 450 today now that azithromycin has been stopped.    He is feeling better today, denies dyspnea at rest.   General: NAD Neck: No JVD, no thyromegaly or thyroid nodule.  Lungs: No wheezing, occasional crackles.  CV: Nondisplaced PMI.  Heart irregular S1/S2, no S3/S4, 2/6 HSM LLSB.  No peripheral edema.  No carotid bruit.  Normal pedal pulses.  Abdomen: Soft, nontender, no hepatosplenomegaly, no distention.  Skin: Intact without lesions or rashes.  Neurologic: Alert and oriented x 3.  Psych: Normal affect. Extremities: No clubbing or cyanosis.  HEENT: Normal.   Continue treatment of PNA (Strep pneumoniae) with ceftriaxone.  Continue treatment of influenza A with oseltamivir.   He remains in atrial fibrillation in 90s.  He is on Tikosyn 375 mcg bid with stable QTc today. The atrial fibrillation was likely triggered by pulmonary disease.  He has tended to go in and out of atrial fibrillation over time, hopefully he will revert to NSR as he improves.  For now, he does not need rate control meds (tends to get orthostatic with beta blockers, CCBs).   Loralie Champagne 03/06/2021 2:32 PM

## 2021-03-06 NOTE — Progress Notes (Addendum)
Mobility Specialist Progress Note    03/06/21 1137  Mobility  Bed Position Chair  Activity Ambulated with assistance in hallway  Level of Assistance Contact guard assist, steadying assist  Assistive Device Front wheel walker  Distance Ambulated (ft) 100 ft  Activity Response Tolerated fair  $Mobility charge 1 Mobility   During Mobility: 108 HR Post-Mobility: 102 HR, 95% on 1L SpO2  Pt received standing in room with wife and RN present and agreeable. Needed 3LO2 to saturate in mid to high 90s but could not stay >/=88% with 2LO2, RN notified. Returned to chair with wife present in room. Left on 1LO2 in room.  [facility] Mobility Specialist  M.S. 2C and 6E: [phone] M.S. 4E: [phone]

## 2021-03-06 NOTE — Progress Notes (Signed)
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 male with medical history significant of stroke, pulmonary hypertension, A. fib, intracerebral hematoma, hyperlipidemia, GERD, CHF, CAD status post PCI, BPH, bradycardia, anxiety, CKD, diverticular low cysts, tricuspid regurgitation who presents with cough and shortness of breath.  Patient has had ongoing cough and shortness of breath for the past few days.  To go home COVID test today which was positive.  He is continuing shortness of breath despite his home inhalers that he takes for asthma and called EMS.  Noted to be saturating in the high 80s on room air improved to 94% on 4 L.  Found to be Flu A positive as well as strep pna +.  On AM of 1/27 went into a fib with RVR  Assessment and Plan * Acute respiratory failure with hypoxia ([place])- (present on admission) -not on home O2 -wean as able -see above re: sats -down to 2L currently  Prolonged QT interval Monitor on tele -replace electrolytes  Asthma, chronic, unspecified asthma severity, with acute exacerbation -steroids -nebs  Influenza A -will re-test as at home he was COVID +-- retest shows flu A again and negative for COVID -start tamiflu for now as still with-in time frame  Lobar pneumonia ([facility])-left lower lobs - (present on admission) -IV abx started-- rocephin only due to Qtc -? Aspiration -strep positive -monitor Qtc daily and on tele -will need repeat x ray to ensure resolution in 3-4 weeks  Paroxysmal atrial fibrillation ([place])- (present on admission) -follows with Dr. Aundra Dubin -on tikosyn -monitor on tele -went into a fib this AM it appears -does not tolerate BB/cardiazem due to orthostatic hypotension -cards consult as last outpatient note mentioned amiodarone  Congestive heart failure with left ventricular  diastolic dysfunction ([place])- (present on admission) -monitor for volume overload esp now that he is in a fib-with RVR  BPH associated with nocturia- (present on admission) -follows with Dr. Eulogio Ditch  Anxiety state- (present on admission) -resume home med     Subjective: went into a fib with RVR last PM  Objective Vitals:   03/06/21 0308 03/06/21 0312 03/06/21 0734 03/06/21 0859  BP:  134/78  111/65  Pulse:  99  97  Resp:  17  19  Temp:  98.1 F (36.7 C)  98 F (36.7 C)  TempSrc:  Oral  Oral  SpO2: 97% 97% 98% 96%  Weight:      Height:        General: Appearance:    Well developed, well nourished male in no acute distress     Lungs:     [place], wheezing on right side with rhonchi, respirations unlabored  Heart:    Normal heart rate. irregular  MS:   All extremities are intact.    Neurologic:   Awake, alert, oriented x 3. No apparent focal neurological           defect.      Data Reviewed: Na slightly low WBC up but suspect due to steroids, no fever  Family Communication: wife on phone in room  Disposition: Status is: Inpatient  Remains inpatient appropriate because:       Time spent: 35 minutes  Author: Geradine Girt, DO 03/06/2021 1:07 PM  For on call review [email or web address].

## 2021-03-06 NOTE — Assessment & Plan Note (Signed)
-  monitor for volume overload esp now that he is in a fib-with RVR

## 2021-03-06 NOTE — Assessment & Plan Note (Signed)
-  resume home med

## 2021-03-07 LAB — CBC
HCT: 35 % — ABNORMAL LOW (ref 39.0–52.0)
Hemoglobin: 11.4 g/dL — ABNORMAL LOW (ref 13.0–17.0)
MCH: 30.2 pg (ref 26.0–34.0)
MCHC: 32.6 g/dL (ref 30.0–36.0)
MCV: 92.6 fL (ref 80.0–100.0)
Platelets: 158 10*3/uL (ref 150–400)
RBC: 3.78 MIL/uL — ABNORMAL LOW (ref 4.22–5.81)
RDW: 13.8 % (ref 11.5–15.5)
WBC: 13.3 10*3/uL — ABNORMAL HIGH (ref 4.0–10.5)
nRBC: 0 % (ref 0.0–0.2)

## 2021-03-07 LAB — BASIC METABOLIC PANEL
Anion gap: 8 (ref 5–15)
BUN: 23 mg/dL (ref 8–23)
CO2: 20 mmol/L — ABNORMAL LOW (ref 22–32)
Calcium: 7.8 mg/dL — ABNORMAL LOW (ref 8.9–10.3)
Chloride: 103 mmol/L (ref 98–111)
Creatinine, Ser: 1.15 mg/dL (ref 0.61–1.24)
GFR, Estimated: >60 mL/min (ref >60)
Glucose, Bld: 140 mg/dL — ABNORMAL HIGH (ref 70–99)
Potassium: 4.6 mmol/L (ref 3.5–5.1)
Sodium: 131 mmol/L — ABNORMAL LOW (ref 135–145)

## 2021-03-07 LAB — MAGNESIUM: Magnesium: 2.3 mg/dL (ref 1.7–2.4)

## 2021-03-07 MED ORDER — LEVALBUTEROL HCL 0.63 MG/3ML IN NEBU: 0.6300 mg | INHALATION_SOLUTION | Freq: Four times a day (QID) | RESPIRATORY_TRACT | 1 refills | Status: DC | PRN

## 2021-03-07 MED ORDER — OSELTAMIVIR PHOSPHATE 30 MG PO CAPS: 30.0000 mg | ORAL_CAPSULE | Freq: Two times a day (BID) | ORAL | 0 refills | Status: DC

## 2021-03-07 MED ORDER — ALBUTEROL SULFATE (2.5 MG/3ML) 0.083% IN NEBU: 2.5000 mg | INHALATION_SOLUTION | RESPIRATORY_TRACT | 0 refills | Status: DC | PRN

## 2021-03-07 MED ORDER — AMOXICILLIN-POT CLAVULANATE 875-125 MG PO TABS
1.0000 | ORAL_TABLET | Freq: Two times a day (BID) | ORAL | Status: DC
  Administered 2021-03-07: 1 via ORAL
  Filled 2021-03-07: qty 1

## 2021-03-07 MED ORDER — PREDNISONE 10 MG PO TABS: ORAL_TABLET | ORAL | 0 refills | Status: AC

## 2021-03-07 MED ORDER — AMOXICILLIN-POT CLAVULANATE 875-125 MG PO TABS: 1.0000 | ORAL_TABLET | Freq: Two times a day (BID) | ORAL | 0 refills | Status: DC

## 2021-03-07 NOTE — Progress Notes (Signed)
Patient instructions reviewed, patient verbalized understanding. Patient via wc in stable condition to wife's waiting car

## 2021-03-07 NOTE — Progress Notes (Addendum)
SATURATION QUALIFICATIONS: (This note is used to comply with regulatory documentation for home oxygen)  Patient Saturations on Room Air at Rest = 97%  Patient Saturations on Room Air while Ambulating 89%  Patient Saturations of 00 Liters of oxygen while Ambulating = 89%  Please briefly explain why patient needs home oxygen: Patient does not require oxygen at home per todays ambulation test.  Oxygen was not applied while walking.

## 2021-03-07 NOTE — Progress Notes (Signed)
Mobility Specialist Progress Note:   03/07/21 1029  Mobility  Activity Ambulated with assistance in hallway  Level of Assistance Standby assist, set-up cues, supervision of patient - no hands on  Assistive Device Four wheel walker  Distance Ambulated (ft) 500 ft  Activity Response Tolerated well  $Mobility charge 1 Mobility   SATURATION QUALIFICATIONS: (This note is used to comply with regulatory documentation for home oxygen)  Patient Saturations on Room Air at Rest = 95%  Patient Saturations on Room Air while Ambulating = 89%-92%  Patient Saturations on n/a Liters of oxygen while Ambulating = n/a%  Pt received in bed willing to participate in mobility. No complaints of pain. Pt left in bed with call bell in reach and all needs met.    [facility] Public librarian Phone [phone] Secondary Phone [phone]

## 2021-03-07 NOTE — Progress Notes (Signed)
Patient ID: [name] Spence, MD, male   DOB: 1929-07-16, 88 y.o.   MRN: [id number]     Advanced Heart Failure Rounding Note  PCP-Cardiologist: None   Subjective:    Remains in atypical atrial flutter rate 90s.    He is off oxygen this morning, feels better.    Objective:   Weight Range: 66.5 kg Body mass index is 21.04 kg/m.   Vital Signs:   Temp:  [97.6 F (36.4 C)-98 F (36.7 C)] 97.8 F (36.6 C) (01/28 0727) Pulse Rate:  [86-101] 97 (01/28 0727) Resp:  [15-20] 19 (01/28 0727) BP: (99-146)/(70-84) 99/70 (01/28 0727) SpO2:  [94 %-98 %] 97 % (01/28 0754)    Weight change: Filed Weights   03/05/21 0823 03/05/21 2040  Weight: 70.9 kg 66.5 kg    Intake/Output:   Intake/Output Summary (Last 24 hours) at 03/07/2021 0955 Last data filed at 03/07/2021 0308 Gross per 24 hour  Intake 585 ml  Output 1050 ml  Net -465 ml      Physical Exam    General:  Well appearing. No resp difficulty HEENT: Normal Neck: Supple. JVP not elevated. Carotids 2+ bilat; no bruits. No lymphadenopathy or thyromegaly appreciated. Cor: PMI nondisplaced. Irregular rate & rhythm. 1/6 HSM LLSB.  Lungs: Occasional wheezes Abdomen: Soft, nontender, nondistended. No hepatosplenomegaly. No bruits or masses. Good bowel sounds. Extremities: No cyanosis, clubbing, rash, edema Neuro: Alert & orientedx3, cranial nerves grossly intact. moves all 4 extremities w/o difficulty. Affect pleasant   Telemetry   Atrial flutter (atypical) rate 90s (personally reviewed)  EKG    Atypical atrial flutter QTc 432 (personally reviewed)  Labs    CBC Recent Labs    03/04/21 2151 03/04/21 2243 03/06/21 0108 03/07/21 0123  WBC 13.1*   < > 18.9* 13.3*  NEUTROABS 11.7*  --   --   --   HGB 12.4*   < > 10.9* 11.4*  HCT 38.5*   < > 33.6* 35.0*  MCV 95.3   < > 92.3 92.6  PLT 148*   < > 152 158   < > = values in this interval not displayed.   Basic Metabolic Panel Recent Labs    03/06/21 0108 03/07/21 0123   NA 132* 131*  K 4.0 4.6  CL 104 103  CO2 21* 20*  GLUCOSE 139* 140*  BUN 23 23  CREATININE 1.18 1.15  CALCIUM 7.9* 7.8*  MG 1.7 2.3   Liver Function Tests Recent Labs    03/04/21 2151  AST 25  ALT 23  ALKPHOS 69  BILITOT 0.9  PROT 6.2*  ALBUMIN 3.4*   No results for input(s): LIPASE, AMYLASE in the last 72 hours. Cardiac Enzymes No results for input(s): CKTOTAL, CKMB, CKMBINDEX, TROPONINI in the last 72 hours.  BNP: BNP (last 3 results) No results for input(s): BNP in the last 8760 hours.  ProBNP (last 3 results) No results for input(s): PROBNP in the last 8760 hours.   D-Dimer No results for input(s): DDIMER in the last 72 hours. Hemoglobin A1C No results for input(s): HGBA1C in the last 72 hours. Fasting Lipid Panel No results for input(s): CHOL, HDL, LDLCALC, TRIG, CHOLHDL, LDLDIRECT in the last 72 hours. Thyroid Function Tests No results for input(s): TSH, T4TOTAL, T3FREE, THYROIDAB in the last 72 hours.  Invalid input(s): FREET3  Other results:   Imaging    No results found.   Medications:     Scheduled Medications:  amoxicillin-clavulanate  1 tablet Oral Q12H   apixaban  5 mg Oral BID   atorvastatin  20 mg Oral Daily   budesonide  0.25 mg Inhalation BID   buPROPion  150 mg Oral Daily   dofetilide  250 mcg Oral BID   FLUoxetine  10 mg Oral Daily   ipratropium  0.5 mg Nebulization TID   levalbuterol  0.63 mg Nebulization TID   oseltamivir  30 mg Oral BID   pantoprazole  40 mg Oral Daily   predniSONE  50 mg Oral Q breakfast   sodium chloride flush  3 mL Intravenous Q12H   tamsulosin  0.4 mg Oral Daily    Infusions:   PRN Medications: albuterol, LORazepam, menthol-cetylpyridinium, nitroGLYCERIN  Assessment/Plan   1. Atrial fibrillation: Paroxysmal.  Episodes are quite symptomatic (fatigue) and often triggered by some form of physical stress, he feels "bad" with atrial fibrillation even when he's rate-controlled. He had frequent  breakthrough on Multaq but and is now on Tikosyn.  He is now off diltiazem CD and atenolol due to low BP and orthostasis.  He had a lot of AF in October but much less in November/December. He went back into atypical flutter this admission in setting of influenza and Strep PNA.  Rate is reasonably controlled in 90s.  - Takes metoprolol PRN at home for palpitations. Does not tolerate daily BB or CCB d/t orthostasis - Continue Tikosyn at current dose, QTc ok on ECG today.   - In past, has converted to NSR with improvement in respiratory disease.  Hopefully will convert as PNA heals.  If not, can arrange DCCV in future.  - Continue Eliquis.  2. Acute respiratory failure with hypoxia:  Influenza A + Strep pneumo PNA.  Now off oxygen.  - Walk today off oxygen.  - Continue course of oseltamivir.  - Continue ceftriaxone for bacterial PNA.  3. Prolonged Qtc:  In setting of azithromycin use, would avoid.  Stable today at 432 msec.  4. CAD: He has chronic stable angina walking up a steep hill.  No change in pattern x 15 years.   - Continue medical management of angina, no indication for stress test or cath.  - He is on apixaban without ASA given stable CAD.   - Continue statin 5. Hyperlipidemia: Good lipids in 8/22 on atorvastatin 20 mg daily.  6. Chronic diastolic CHF:  Most recent echo in 8/19 showed EF 70-62% but PA systolic pressure was estimated at 60 mmHg and there was severe TR (progressed from moderate), the RV looked normal.   Chronic NYHA II symptoms. Not volume overloaded on exam.  - He has wanted to hold off on repeat echo.  7. Tricuspid regurgitation: Severe on 8/19 echo, he does have a TR murmur.  Not candidate for surgical TVR and not interested in referral to trial for percutaneous repair.   If he remains off oxygen and is able to walk halls, can probably go home today.  I will arrange office followup next week to see if he remains in atrial fibrillation.     Length of Stay: 2  Loralie Champagne, MD  03/07/2021, 9:55 AM  Advanced Heart Failure Team Pager [phone] (M-F; 7a - 5p)  Please contact [facility] for night-coverage after hours (5p -7a ) and weekends on [email or web address]

## 2021-03-07 NOTE — Discharge Summary (Addendum)
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 Aundra Dubin for low Na (suspect related to diet in hospital vs lung infection) Sputum culture pending-- follow   Discharge Diagnosis:   Principal Problem:   Acute respiratory failure with hypoxia ([place]) Active Problems:   Lobar pneumonia (HCC)-left lower lobs    Influenza A   Asthma, chronic, unspecified asthma severity, with acute exacerbation   Prolonged QT interval   Paroxysmal atrial fibrillation (HCC)   Hypocalcemia   Anxiety state   CAD S/P OM PCI 2010   BPH associated with nocturia   Congestive heart failure with left ventricular diastolic dysfunction ([place])   Hyperlipidemia   GERD   History of CVA (cerebrovascular accident)   Fever    Discharge Condition: Improved.  Diet recommendation:   Regular.  Wound care: None.  Code status: Full.   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 fib, intracerebral hematoma, hyperlipidemia, GERD, CHF, CAD status post PCI, BPH, bradycardia, anxiety, CKD, diverticular low cysts, tricuspid regurgitation who presents with cough and shortness of breath.  Patient has had ongoing cough and shortness of breath for the past few days.  To go home COVID test today which was positive.  He is continuing shortness of breath despite his home inhalers that he takes for asthma and called EMS.  Noted to be saturating in the high 80s on room air improved to 94% on 4 L.    Additionally reports wheezing. He denies fevers, chills, chest pain, abdominal pain, constipation,  diarrhea, nausea, vomiting.   Of note, patient is a retired [employer].   Hospital Course by Problem:   * Acute respiratory failure with hypoxia ([place])- (present on admission) -not on home O2 -weaned to room air -prior to d/c walking O2 sats (when pleth was good stayed >90%, sats would drop when patient gripped the RW tightly)   Prolonged QT interval -resolved   Asthma, chronic, unspecified asthma severity, with acute exacerbation -steroids- slow taper instead of burst -discussed care with patient's pulmonologist -nebs- have sent both xopenex and albuterol to the pharmacy-- patient knows to only fill albuterol if xopenex is expensive   Influenza A -start tamiflu   Lobar pneumonia (HCC)-left lower lobs - (present on admission) -IV abx started-- rocephin only due to Qtc -strep positive-- narrow to augmentin -will need repeat x ray to ensure resolution in 3-4 weeks   Paroxysmal atrial fibrillation ([place])- (present on admission) -follows with Dr. Aundra Dubin -on [place] -went into a fib this AM it appears -does not tolerate BB/cardiazem due to orthostatic hypotension -cards consult appreciated- no change in meds -suspect will return to sinus once acute illness treated   Congestive heart failure with left ventricular diastolic dysfunction ([place])- (present on admission) -no sign of overload   BPH associated with nocturia- (present on admission) -follows with Dr. Eulogio Ditch   Anxiety state- (present on admission) -resume home med      Medical Consultants:   cards   Discharge Exam:   Vitals:   03/07/21 0754 03/07/21 1152  BP:  135/87  Pulse:  94  Resp:  19  Temp:  97.8 F (36.6 C)  SpO2: 97% 95%   Vitals:   03/07/21 0727 03/07/21 0752 03/07/21 0754 03/07/21 1152  BP: 99/70   135/87  Pulse: 97   94  Resp: 19   19  Temp: 97.8 F (36.6 C)   97.8 F (36.6 C)  TempSrc: Oral   Axillary  SpO2: 94% 97% 97% 95%  Weight:      Height:        General exam: Appears  calm and comfortable.  Excited to go home Occ wheeze  The results of significant diagnostics from this hospitalization (including imaging, microbiology, ancillary and laboratory) are listed below for reference.     Procedures and Diagnostic Studies:   DG Chest Portable 1 View  Result Date: 03/04/2021 CLINICAL DATA:  Evaluate for pneumonia. EXAM: PORTABLE CHEST 1 VIEW COMPARISON:  Chest x-ray 12/04/2020. FINDINGS: The aorta is ectatic. Cardiac silhouette is within normal limits. There are minimal patchy opacities in the medial right lung base. There are strandy opacities in the left lung base favored is atelectasis or scarring. There is no pleural effusion or pneumothorax. No acute fractures are seen. IMPRESSION: 1. Minimal patchy opacities in the medial right lung base worrisome for infection or aspiration. Followup PA and lateral chest X-ray is recommended in 3-4 weeks following trial of antibiotic therapy to ensure resolution and exclude underlying malignancy. Electronically Signed   By: Ronney Asters M.D.   On: 03/04/2021 22:58     Labs:   Basic Metabolic Panel: Recent Labs  Lab 03/04/21 2151 03/04/21 2243 03/05/21 0051 03/06/21 0108 03/07/21 0123  NA 132* 135 135 132* 131*  K 4.0 4.4 3.6 4.0 4.6  CL 105  --  104 104 103  CO2 18*  --  21* 21* 20*  GLUCOSE 175*  --  137* 139* 140*  BUN 18  --  17 23 23   CREATININE 1.30*  --  1.34* 1.18 1.15  CALCIUM 7.7*  --  7.5* 7.9* 7.8*  MG  --   --   --  1.7 2.3   GFR Estimated Creatinine Clearance: 41 mL/min (by C-G formula based on SCr of 1.15 mg/dL). Liver Function Tests: Recent Labs  Lab 03/04/21 2151  AST 25  ALT 23  ALKPHOS 69  BILITOT 0.9  PROT 6.2*  ALBUMIN 3.4*   No results for input(s): LIPASE, AMYLASE in the last 168 hours. No results for input(s): AMMONIA in the last 168 hours. Coagulation profile No results for input(s): INR, PROTIME in the last 168 hours.  CBC: Recent Labs  Lab 03/04/21 2151 03/04/21 2243  03/05/21 0051 03/06/21 0108 03/07/21 0123  WBC 13.1*  --  14.7* 18.9* 13.3*  NEUTROABS 11.7*  --   --   --   --   HGB 12.4* 12.2* 12.4* 10.9* 11.4*  HCT 38.5* 36.0* 37.9* 33.6* 35.0*  MCV 95.3  --  96.2 92.3 92.6  PLT 148*  --  150 152 158   Cardiac Enzymes: No results for input(s): CKTOTAL, CKMB, CKMBINDEX, TROPONINI in the last 168 hours. BNP: Invalid input(s): POCBNP CBG: No results for input(s): GLUCAP in the last 168 hours. D-Dimer No results for input(s): DDIMER in the last 72 hours. Hgb A1c No results for input(s): HGBA1C in the last 72 hours. Lipid Profile No results for input(s): CHOL, HDL, LDLCALC, TRIG, CHOLHDL, LDLDIRECT in the last 72 hours. Thyroid function studies No results for input(s): TSH, T4TOTAL, T3FREE, THYROIDAB in the last 72 hours.  Invalid input(s): FREET3 Anemia work up No results for input(s): VITAMINB12, FOLATE, FERRITIN, TIBC, IRON, RETICCTPCT in the last 72 hours. Microbiology Recent Results (from the past 240 hour(s))  Resp Panel by RT-PCR (Flu A&B, Covid) Nasopharyngeal Swab     Status: Abnormal   Collection Time: 03/04/21  9:51 PM   Specimen: Nasopharyngeal Swab; Nasopharyngeal(NP) swabs in vial transport medium  Result Value Ref Range Status   SARS Coronavirus 2 by RT PCR NEGATIVE NEGATIVE Final    Comment: (NOTE) SARS-CoV-2 target nucleic acids are NOT DETECTED.  The SARS-CoV-2 RNA is generally detectable in upper respiratory specimens during the acute phase of infection. The lowest concentration of SARS-CoV-2 viral copies this assay can detect is 138 copies/mL. A negative result does not preclude SARS-Cov-2 infection and should not be used as the sole basis for treatment or other patient management decisions. A negative result may occur with  improper specimen collection/handling, submission of specimen other than nasopharyngeal swab, presence of viral mutation(s) within the areas targeted by this assay, and inadequate number of  viral copies(<138 copies/mL). A negative result must be combined with clinical observations, patient history, and epidemiological information. The expected result is Negative.  Fact Sheet for Patients:  EntrepreneurPulse.com.au  Fact Sheet for Healthcare Providers:  IncredibleEmployment.be  This test is no t yet approved or cleared by the [place] FDA and  has been authorized for detection and/or diagnosis of SARS-CoV-2 by FDA under an Emergency Use Authorization (EUA). This EUA will remain  in effect (meaning this test can be used) for the duration of the COVID-19 declaration under Section 564(b)(1) of the Act, 21 U.S.C.section 360bbb-3(b)(1), unless the authorization is terminated  or revoked sooner.       Influenza A by PCR POSITIVE (A) NEGATIVE Final   Influenza B by PCR NEGATIVE NEGATIVE Final    Comment: (NOTE) The Xpert Xpress SARS-CoV-2/FLU/RSV plus assay is intended as an aid in the diagnosis of influenza from Nasopharyngeal swab specimens and should not be used as a sole basis for treatment. Nasal washings and aspirates are unacceptable for Xpert Xpress SARS-CoV-2/FLU/RSV testing.  Fact Sheet for Patients: EntrepreneurPulse.com.au  Fact Sheet for Healthcare Providers: IncredibleEmployment.be  This test is not yet approved or cleared by the [place] FDA and has been authorized for detection and/or diagnosis of SARS-CoV-2 by FDA under an Emergency Use Authorization (EUA). This EUA will remain in effect (meaning this test can be used) for the duration of the COVID-19 declaration under Section 564(b)(1) of the Act, 21 U.S.C. section 360bbb-3(b)(1), unless the authorization is terminated or revoked.  Performed at [facility], [place] [address] [place], [place] [address]   Resp Panel by RT-PCR (Flu A&B, Covid) Nasopharyngeal Swab     Status: Abnormal   Collection Time: 03/05/21  10:52 AM   Specimen: Nasopharyngeal Swab; Nasopharyngeal(NP) swabs in vial transport medium  Result Value Ref Range Status   SARS Coronavirus 2 by RT PCR NEGATIVE NEGATIVE Final    Comment: (NOTE) SARS-CoV-2 target nucleic acids are NOT DETECTED.  The SARS-CoV-2 RNA is generally detectable in upper respiratory specimens during the acute phase of infection. The lowest concentration of SARS-CoV-2 viral copies this assay can detect is 138 copies/mL. A negative result does not preclude SARS-Cov-2 infection and should not be used as the sole basis for treatment or other patient management decisions. A negative result may occur with  improper specimen collection/handling, submission of specimen other than nasopharyngeal swab, presence of viral mutation(s) within the areas targeted  by this assay, and inadequate number of viral copies(<138 copies/mL). A negative result must be combined with clinical observations, patient history, and epidemiological information. The expected result is Negative.  Fact Sheet for Patients:  EntrepreneurPulse.com.au  Fact Sheet for Healthcare Providers:  IncredibleEmployment.be  This test is no t yet approved or cleared by the [place] FDA and  has been authorized for detection and/or diagnosis of SARS-CoV-2 by FDA under an Emergency Use Authorization (EUA). This EUA will remain  in effect (meaning this test can be used) for the duration of the COVID-19 declaration under Section 564(b)(1) of the Act, 21 U.S.C.section 360bbb-3(b)(1), unless the authorization is terminated  or revoked sooner.       Influenza A by PCR POSITIVE (A) NEGATIVE Final   Influenza B by PCR NEGATIVE NEGATIVE Final    Comment: (NOTE) The Xpert Xpress SARS-CoV-2/FLU/RSV plus assay is intended as an aid in the diagnosis of influenza from Nasopharyngeal swab specimens and should not be used as a sole basis for treatment. Nasal washings  and aspirates are unacceptable for Xpert Xpress SARS-CoV-2/FLU/RSV testing.  Fact Sheet for Patients: EntrepreneurPulse.com.au  Fact Sheet for Healthcare Providers: IncredibleEmployment.be  This test is not yet approved or cleared by the [place] FDA and has been authorized for detection and/or diagnosis of SARS-CoV-2 by FDA under an Emergency Use Authorization (EUA). This EUA will remain in effect (meaning this test can be used) for the duration of the COVID-19 declaration under Section 564(b)(1) of the Act, 21 U.S.C. section 360bbb-3(b)(1), unless the authorization is terminated or revoked.  Performed at [facility], [place] [address] [place], [place] [address]   MRSA Next Gen by PCR, Nasal     Status: None   Collection Time: 03/05/21  8:34 PM   Specimen: Nasal Mucosa; Nasal Swab  Result Value Ref Range Status   MRSA by PCR Next Gen NOT DETECTED NOT DETECTED Final    Comment: (NOTE) The GeneXpert MRSA Assay (FDA approved for NASAL specimens only), is one component of a comprehensive MRSA colonization surveillance program. It is not intended to diagnose MRSA infection nor to guide or monitor treatment for MRSA infections. Test performance is not FDA approved in patients less than 72 years old. Performed at [facility], [place] [address] [place], [place] [address]   Expectorated Sputum Assessment w Gram Stain, Rflx to Resp Cult     Status: None   Collection Time: 03/06/21 11:53 AM   Specimen: Expectorated Sputum  Result Value Ref Range Status   Specimen Description EXPECTORATED SPUTUM  Final   Special Requests NONE  Final   Sputum evaluation   Final    THIS SPECIMEN IS ACCEPTABLE FOR SPUTUM CULTURE Performed at [facility], [place] [address], [place], [place] [address]    Report Status 03/06/2021 FINAL  Final  Culture, Respiratory w Gram Stain     Status: None (Preliminary result)   Collection Time: 03/06/21 11:53 AM   Result Value Ref Range Status   Specimen Description EXPECTORATED SPUTUM  Final   Special Requests NONE Reflexed from [id number]  Final   Gram Stain   Final    MODERATE WBC PRESENT,BOTH PMN AND MONONUCLEAR NO ORGANISMS SEEN    Culture   Final    CULTURE REINCUBATED FOR BETTER GROWTH Performed at [facility], [place] [address], [place], [place] [address]    Report Status PENDING  Incomplete     Discharge Instructions:   Discharge Instructions  Diet general   Complete by: As directed    Discharge instructions   Complete by: As directed    BMP Tuesday with Dr. Aundra Dubin   Increase activity slowly   Complete by: As directed       Allergies as of 03/07/2021       Reactions   Tylenol [acetaminophen] Rash   Azithromycin    Advil [ibuprofen] Nausea Only, Rash   Asa [aspirin] Rash        Medication List     STOP taking these medications    metoprolol tartrate 25 MG tablet Commonly known as: LOPRESSOR       TAKE these medications    albuterol 108 (90 Base) MCG/ACT inhaler Commonly known as: VENTOLIN HFA Inhale 2 puffs into the lungs every 4 (four) hours as needed for wheezing or shortness of breath. What changed: Another medication with the same name was added. Make sure you understand how and when to take each.   albuterol (2.5 MG/3ML) 0.083% nebulizer solution Commonly known as: PROVENTIL Inhale 3 mLs (2.5 mg total) into the lungs every 4 (four) hours as needed for wheezing or shortness of breath. What changed: You were already taking a medication with the same name, and this prescription was added. Make sure you understand how and when to take each.   amoxicillin-clavulanate 875-125 MG tablet Commonly known as: AUGMENTIN Take 1 tablet by mouth every 12 (twelve) hours.   Arnuity Ellipta 200 MCG/ACT Aepb Generic drug: Fluticasone Furoate INHALE 1 PUFF INTO THE LUNGS DAILY   atorvastatin 20 MG tablet Commonly known as: LIPITOR Take 1 tablet by mouth once  daily What changed:  how much to take how to take this when to take this additional instructions   buPROPion 150 MG 12 hr tablet Commonly known as: WELLBUTRIN SR TAKE 1 TABLET(150 MG) BY MOUTH DAILY What changed:  how much to take how to take this when to take this additional instructions   cetirizine 10 MG tablet Commonly known as: ZYRTEC Take 10 mg by mouth daily with supper.   dofetilide 250 MCG capsule Commonly known as: TIKOSYN TAKE 1 CAPSULE(250 MCG) BY MOUTH TWICE DAILY What changed:  how much to take how to take this when to take this additional instructions   Eliquis 5 MG Tabs tablet Generic drug: apixaban Take 1 tablet (5 mg total) by mouth 2 (two) times daily.   FLUoxetine 10 MG capsule Commonly known as: PROZAC Take 1 capsule (10 mg total) by mouth daily.   fluticasone 50 MCG/ACT nasal spray Commonly known as: FLONASE SHAKE LIQUID AND USE 2 SPRAYS IN EACH NOSTRIL DAILY What changed: See the new instructions.   levalbuterol 0.63 MG/3ML nebulizer solution Commonly known as: XOPENEX Take 3 mLs (0.63 mg total) by nebulization every 6 (six) hours as needed for wheezing or shortness of breath.   LORazepam 1 MG tablet Commonly known as: ATIVAN TAKE 1/2 TO 1 TABLET BY MOUTH AT BEDTIME AS NEEDED What changed:  how much to take reasons to take this additional instructions   nitroGLYCERIN 0.4 MG SL tablet Commonly known as: NITROSTAT Place 1 tablet (0.4 mg total) under the tongue every 5 (five) minutes x 3 doses as needed for chest pain.   omeprazole 20 MG tablet Commonly known as: PRILOSEC OTC Take 1 tablet (20 mg total) by mouth daily.   oseltamivir 30 MG capsule Commonly known as: TAMIFLU Take 1 capsule (30 mg total) by mouth 2 (two) times daily.   predniSONE 10 MG  tablet Commonly known as: DELTASONE Take 4 tablets (40 mg total) by mouth daily with breakfast for 2 days, THEN 3 tablets (30 mg total) daily with breakfast for 2 days, THEN 2 tablets  (20 mg total) daily with breakfast for 2 days, THEN 1 tablet (10 mg total) daily with breakfast for 2 days. Start taking on: March 07, 2021   tamsulosin 0.4 MG Caps capsule Commonly known as: FLOMAX TAKE 1 CAPSULE(0.4 MG) BY MOUTH DAILY What changed:  how much to take how to take this when to take this additional instructions   Vitamin D 50 MCG (2000 UT) tablet Take 2,000 Units by mouth daily.               Durable Medical Equipment  (From admission, onward)           Start     Ordered   03/07/21 1054  For home use only DME Nebulizer machine  Once       Question Answer Comment  Patient needs a nebulizer to treat with the following condition Pneumonia   Length of Need Lifetime      03/07/21 1053              Time coordinating discharge: 35 min  Signed:  Geradine Girt DO  [facility] 03/07/2021, 1:39 PM

## 2021-03-07 NOTE — TOC Transition Note (Signed)
Transition of Care [facility]) - CM/SW Discharge Note   Patient Details  Name: [name] SCHREIER, [name] [name] MRN: [id number] Date of Birth: 1929-05-09  Transition of Care [facility]) CM/SW Contact:  Bartholomew Crews, RN Phone Number: [phone] 03/07/2021, 11:32 AM   Clinical Narrative:     Spoke with patient on his hospital room phone to discuss post acute transition. Discussed need for nebulizer. Offered choice of DME provider. Referral sent to [facility] for delivery to the room. No further TOC needs identified at this time.   Final next level of care: Home/Self Care Barriers to Discharge: No Barriers Identified   Patient Goals and CMS Choice Patient states their goals for this hospitalization and ongoing recovery are:: return home with wife CMS Medicare.gov Compare Post Acute Care list provided to:: Patient Choice offered to / list presented to : Patient  Discharge Placement                       Discharge Plan and Services                DME Arranged: Nebulizer machine DME Agency: [facility] Date DME Agency Contacted: 03/07/21 Time DME Agency Contacted: [phone] Representative spoke with at DME Agency: Mardene Celeste HH Arranged: NA Ewa Gentry Agency: NA        Social Determinants of Health ([place]) Interventions     Readmission Risk Interventions No flowsheet data found.

## 2021-03-07 NOTE — Discharge Instructions (Signed)
Will need repeat x ray in 3-4 weeks to ensure resolution of pneumonia

## 2021-03-08 LAB — CULTURE, RESPIRATORY W GRAM STAIN

## 2021-03-09 ENCOUNTER — Telehealth: Payer: Self-pay | Admitting: Internal Medicine

## 2021-03-09 NOTE — Telephone Encounter (Signed)
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  He just got admitted for influenza and pneumococcal pneumonia.  He just got out of the hospital.  He has follow-up appointment with cardiology tomorrow 03/10/2021. Dr Velora Heckler his friend called and his also called me over weekend  Plan - He needs to be seen in in office this week - please bring him in thus 03/12/21 afternoon either at end or beginning or middle. I can even start at 1.15pm

## 2021-03-10 ENCOUNTER — Ambulatory Visit (HOSPITAL_COMMUNITY)
Admission: RE | Admit: 2021-03-10 | Discharge: 2021-03-10 | Disposition: A | Discharge status: Home / Self Care | Payer: PPO | Source: Ambulatory Visit | Attending: Cardiology | Admitting: Cardiology

## 2021-03-10 ENCOUNTER — Encounter (HOSPITAL_COMMUNITY): Payer: Self-pay | Admitting: Cardiology

## 2021-03-10 ENCOUNTER — Other Ambulatory Visit: Payer: Self-pay

## 2021-03-10 VITALS — BP 150/78 | HR 74 | Wt 154.4 lb

## 2021-03-10 DIAGNOSIS — Z79899 Other long term (current) drug therapy: Secondary | ICD-10-CM | POA: Diagnosis not present

## 2021-03-10 DIAGNOSIS — I130 Hypertensive heart and chronic kidney disease with heart failure and stage 1 through stage 4 chronic kidney disease, or unspecified chronic kidney disease: Secondary | ICD-10-CM | POA: Insufficient documentation

## 2021-03-10 DIAGNOSIS — I480 Paroxysmal atrial fibrillation: Secondary | ICD-10-CM | POA: Diagnosis not present

## 2021-03-10 DIAGNOSIS — E785 Hyperlipidemia, unspecified: Secondary | ICD-10-CM | POA: Insufficient documentation

## 2021-03-10 DIAGNOSIS — I5032 Chronic diastolic (congestive) heart failure: Secondary | ICD-10-CM | POA: Diagnosis not present

## 2021-03-10 DIAGNOSIS — Z8546 Personal history of malignant neoplasm of prostate: Secondary | ICD-10-CM | POA: Insufficient documentation

## 2021-03-10 DIAGNOSIS — Z7901 Long term (current) use of anticoagulants: Secondary | ICD-10-CM | POA: Insufficient documentation

## 2021-03-10 DIAGNOSIS — R011 Cardiac murmur, unspecified: Secondary | ICD-10-CM | POA: Insufficient documentation

## 2021-03-10 DIAGNOSIS — I25119 Atherosclerotic heart disease of native coronary artery with unspecified angina pectoris: Secondary | ICD-10-CM | POA: Insufficient documentation

## 2021-03-10 NOTE — Progress Notes (Signed)
Patient ID: [name], MD, male   DOB: 02-Mar-1929, 88 y.o.   MRN: [id number] PCP: [name], MD Cardiology: Dr. 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  He had an RV infarct with chronic total occlusion of a small nondominant RCA in the 1980s.  He had a drug-eluting stent to the CFX in 11/10. He is very symptomatic with atrial fibrillation episodes. He has had a chronic pattern of mild shortness of breath and mild left shoulder pain/chest aching when walking up a hill (x 20 years).   He can walk 3 miles on flat ground without problems.  He golfs 3-4 times a week.  He walks for 20-30 minutes on a treadmill several times a week.   Echo in 7/16 showed normal LV systolic function, EF 50-93% with moderate diastolic dysfunction.  Moderate TR with PA systolic pressure 65 mmHg.  Normal RV size and systolic function.  Given volume overload on exam and abnormal echo, he was started on low dose Lasix in the past.    He was started on Multaq but continued to have episodes of atrial fibrillation.  This was stopped, and he was admitted for Tikosyn loading.  After he went home, he developed left facial droop and left arm weakness.  He was found to have a small right parietal cortical infarct with hemorrhagic conversion.  His symptoms resolved quickly. INR was subtherapeutic at admission.  Warfarin was stopped and he was put on apixaban.   In 9/17, he was admitted with LLL PNA.  During the admission, he had a couple of relatively short runs of atrial fibrillation.  He developed more frequent episodes of atrial fibrillation at home and was started on Tikosyn.   In 5/19, he had bilateral orchiectomy for metastatic prostate cancer.  His PSA has come down considerably.   In 1/20, he had influenza that progressed to PNA.  He was hospitalized with transient atrial fibrillation.    In 11/20, he was  admitted with an E coli UTI and urosepsis with hypotension and AKI.  He was initially fluid-resuscitated and later required diuresis.  He was in atrial fibrillation while in the hospital.   Patient was admitted in 1/23 with influenza A as well as Strep pneumoniae PNA.  He was treated with oseltamivir and antibiotics. While in the hospital, he went into atrial fibrillation with controlled rate.    Patient returns for followup after recent hospitalization.  He feels like he is recovering well.  No dyspnea walking on flat ground.  No more wheezing.  No chest pain.  He is noted to be back in NSR today.  He has finished oseltamivir, still on antibiotics.  No BRBPR/melena.  Weight down 2 lbs.   ECG (personally reviewed): NSR, QTc 465 msec   Labs (6/11): LDL 63, HDL 35, K 5.5, creatinine 1.1 Labs (6/12): LDL 58, HDL 53, TSH normal, K 4.7, creatinine 1 Labs (12/12): BNP 79 Labs (7/14): K 5, creatinine 1.1, LDL 48, HDL 34 Labs (8/15): LDL 48, HDL 33, TSH normal, K 5.4, creatinine 1.2 Labs (6/16): K 5.1, creatinine 1.27, HCT 42.5 Labs (10/16): LDL 37, HDL 32 Labs (11/16): K 4.4, creatinine 1.36, BNP 103 Labs  (2/17): K 4.5, creatinine 1.38, Mg 2.0, LDL 32 Labs (3/17): HCT 41.7 Labs (5/17): K 4.2, creatinine 1.17, Mg 2.1 Labs (9/17): K 4.2, creatinine 1.16, HCT 37.3 Labs (11/17): hgb 13.3, LDL  40 Labs (5/18): K 4.5, creatinine 1.25 Labs (5/19): K 5.5, creatinine 1.16 Labs (2/20): K 4, creatinine 1.24, hgb 12.2 Labs (11/20): K 3.4, creatinine 1.34, hgb 10.3, LDL 49 Labs (12/20): K 4.5, creatinine 1.2 Labs (2/21): K 3.9, creatinine 1.5, TSH 4.5 (mildly elevated) Labs (5/21): TSH normal, K 5, creatinine 1.27 Labs (9/21): K 4.1, creatinine 1.22, LDL 40, TSH normal Labs (4/22): K 4.2, creatinine 1.11 Labs (8/22): hgb 12, LDL 36 Labs (10/22): K 4.5, creatinine 1.38 Labs (1/23): K 4.6, creatinine 1.15, hgb 11.4  PMH: 1. CAD: RV infarct in the 1980s.  Chronic total occlusion of a small nondominant  RCA.  11/10 PCI to CFX with Xience DES.  EF 60% on LV-gram at that time. ETT (11/14) with 9'07" exercise, no significant ST depression.  2.  Paroxysmal atrial fibrillation: Multaq was ineffective. Now on Tikosyn.  3.  Hyperlipidemia 4. GERD 5. HTN 6. Asthma 7. Chronic diastolic CHF: Echo (5/46): EF 55-60%, mild LV hypertrophy, no regional WMAs, mild AI, mild MR, mild-moderate biatrial enlargement, PA systolic pressure 51 mmHg. Echo (7/16) with EF 60-65%, moderate diastolic dysfunction, normal RV size and systolic function, mild-moderate MR, moderate TR, moderate biatrial enlargement, PA systolic pressure 65 mmHg.  - Echo (5/18): EF 55-60%, mild AI, mild MR, PASP 60 mmHg, normal RV size and systolic function.  - Echo (8/19): EF 60-65%, normal RV size and systolic function, severe TR with PASP 60 mmHg.  8. CKD 9. CVA: Right parietal cortical CVA with peri-infarct hemorrhagic conversion.  CTA showed right M2 branch flow gap.   10. Prostate cancer: Metastatic.  - Bilateral orchiectomy in 5/19.   SH: Nonsmoker.  Retired Stage manager, lives in [place].  2 daughters.  Originally from [place].   FH: Brother with CABG.   ROS: All systems reviewed and negative except as per HPI.    Current Outpatient Medications  Medication Sig Dispense Refill   albuterol (PROVENTIL) (2.5 MG/3ML) 0.083% nebulizer solution Inhale 3 mLs (2.5 mg total) into the lungs every 4 (four) hours as needed for wheezing or shortness of breath. 75 mL 0   albuterol (VENTOLIN HFA) 108 (90 Base) MCG/ACT inhaler Inhale 2 puffs into the lungs every 4 (four) hours as needed for wheezing or shortness of breath. 18 g 2   amoxicillin-clavulanate (AUGMENTIN) 875-125 MG tablet Take 1 tablet by mouth every 12 (twelve) hours. 10 tablet 0   apixaban (ELIQUIS) 5 MG TABS tablet Take 1 tablet (5 mg total) by mouth 2 (two) times daily. 180 tablet 3   ARNUITY ELLIPTA 200 MCG/ACT AEPB INHALE 1 PUFF INTO THE LUNGS DAILY 60 each 5   atorvastatin  (LIPITOR) 20 MG tablet Take 1 tablet by mouth once daily 90 tablet 3   buPROPion (WELLBUTRIN SR) 150 MG 12 hr tablet TAKE 1 TABLET(150 MG) BY MOUTH DAILY 90 tablet 3   cetirizine (ZYRTEC) 10 MG tablet Take 10 mg by mouth daily with supper.      Cholecalciferol (VITAMIN D) 2000 units tablet Take 2,000 Units by mouth daily.     dofetilide (TIKOSYN) 250 MCG capsule TAKE 1 CAPSULE(250 MCG) BY MOUTH TWICE DAILY 120 capsule 3   FLUoxetine (PROZAC) 10 MG capsule Take 1 capsule (10 mg total) by mouth daily. 90 capsule 3   fluticasone (FLONASE) 50 MCG/ACT nasal spray SHAKE LIQUID AND USE 2 SPRAYS IN EACH NOSTRIL DAILY 48 g 3   nitroGLYCERIN (NITROSTAT) 0.4 MG SL tablet Place 1 tablet (0.4 mg total) under the tongue every 5 (five) minutes x  3 doses as needed for chest pain. 25 tablet 2   omeprazole (PRILOSEC OTC) 20 MG tablet Take 1 tablet (20 mg total) by mouth daily. 100 tablet 4   predniSONE (DELTASONE) 10 MG tablet Take 4 tablets (40 mg total) by mouth daily with breakfast for 2 days, THEN 3 tablets (30 mg total) daily with breakfast for 2 days, THEN 2 tablets (20 mg total) daily with breakfast for 2 days, THEN 1 tablet (10 mg total) daily with breakfast for 2 days. 20 tablet 0   tamsulosin (FLOMAX) 0.4 MG CAPS capsule TAKE 1 CAPSULE(0.4 MG) BY MOUTH DAILY 90 capsule 3   LORazepam (ATIVAN) 1 MG tablet TAKE 1/2 TO 1 TABLET BY MOUTH AT BEDTIME AS NEEDED 90 tablet 1   No current facility-administered medications for this encounter.    BP (!) 150/78    Pulse 74    Wt 70 kg (154 lb 6.4 oz)    SpO2 98%    BMI 22.15 kg/m  General: NAD Neck: No JVD, no thyromegaly or thyroid nodule.  Lungs: Mild crackles left base.  CV: Nondisplaced PMI.  Heart regular S1/S2, no S3/S4, 1/6 HSM LLSB.  Trace ankle edema.  No carotid bruit.  Normal pedal pulses.  Abdomen: Soft, nontender, no hepatosplenomegaly, no distention.  Skin: Intact without lesions or rashes.  Neurologic: Alert and oriented x 3.  Psych: Normal  affect. Extremities: No clubbing or cyanosis.  HEENT: Normal.   Assessment/Plan: 1. CAD: He has chronic stable angina walking up a steep hill.  No change in pattern x 15 years.   - Continue medical management of angina, no indication for stress test or cath.  - He is on apixaban without ASA given stable CAD.   - Continue statin 2. Hyperlipidemia: Good lipids in 8/22 on atorvastatin 20 mg daily.  3. Chronic diastolic CHF:  Most recent echo in 8/19 showed EF 76-73% but PA systolic pressure was estimated at 60 mmHg and there was severe TR (progressed from moderate), the RV looked normal.  I suspect that he has a degree of RV failure accounting for NYHA class II symptoms.  He is not taking Lasix now and weight is stable.  He is not volume overloaded on exam.  - He wants to hold off on repeat echo.  Given age, think this is reasonable as unlikely to change management much.   - He can stay off Lasix for now.  4. Atrial fibrillation: Paroxysmal.  Episodes are quite symptomatic (fatigue) and often triggered by some form of physical stress, he feels "bad" with atrial fibrillation even when he's rate-controlled. He had frequent breakthrough on Multaq but and is now on Tikosyn.  QTc ok on today's ECG.  He is now off diltiazem CD and atenolol due to low BP and orthostasis.  He had atrial fibrillation during recent hospitalization with PNA, but he is back in NSR today.  - Can take metoprolol 25 mg prn tachypalpitations.  - Continue Tikosyn.  Recent K and Mg were normal.  - If he develops more frequent breakthrough afib on Tikosyn, amiodarone would be an option.   - Continue Eliquis.  5. Tricuspid regurgitation: Severe on 8/19 echo, he does have a TR murmur.  Not candidate for surgical TVR and not interested in referral to trial for percutaneous repair.   Followup in 3 months.    Loralie Champagne 03/10/2021

## 2021-03-10 NOTE — Telephone Encounter (Signed)
Dr. Caryn Section is scheduled on Thursday 2/2 at 1:15pm. Patient aware. [name] [name].

## 2021-03-10 NOTE — Patient Instructions (Signed)
No changes to medications.  Your physician recommends that you schedule a follow-up appointment in: 3 months.  If you have any questions or concerns before your next appointment please send [place] a message through [place] or call our office at [phone].    TO LEAVE A MESSAGE FOR THE NURSE SELECT OPTION 2, PLEASE LEAVE A MESSAGE INCLUDING: YOUR NAME DATE OF BIRTH CALL BACK NUMBER REASON FOR CALL**this is important as we prioritize the call backs  YOU WILL RECEIVE A CALL BACK THE SAME DAY AS LONG AS YOU CALL BEFORE 4:00 PM  At the [facility], you and your health needs are our priority. As part of our continuing mission to provide you with exceptional heart care, we have created designated Provider Care Teams. These Care Teams include your primary Cardiologist (physician) and Advanced Practice Providers (APPs- Physician Assistants and Nurse Practitioners) who all work together to provide you with the care you need, when you need it.   You may see any of the following providers on your designated Care Team at your next follow up: Dr Glori Bickers Dr Haynes Kerns, NP Lyda Jester, [place] [facility] [place], [place] Audry Riles, PharmD   Please be sure to bring in all your medications bottles to every appointment.

## 2021-03-12 ENCOUNTER — Other Ambulatory Visit: Payer: Self-pay

## 2021-03-12 ENCOUNTER — Encounter: Payer: Self-pay | Admitting: Internal Medicine

## 2021-03-12 ENCOUNTER — Ambulatory Visit: Payer: PPO | Admitting: Internal Medicine

## 2021-03-12 VITALS — BP 120/68 | HR 75 | Ht 70.0 in | Wt 154.0 lb

## 2021-03-12 DIAGNOSIS — J4530 Mild persistent asthma, uncomplicated: Secondary | ICD-10-CM | POA: Diagnosis not present

## 2021-03-12 DIAGNOSIS — Z7184 Encounter for health counseling related to travel: Secondary | ICD-10-CM | POA: Diagnosis not present

## 2021-03-12 DIAGNOSIS — Z8709 Personal history of other diseases of the respiratory system: Secondary | ICD-10-CM

## 2021-03-12 DIAGNOSIS — Z8701 Personal history of pneumonia (recurrent): Secondary | ICD-10-CM

## 2021-03-12 MED ORDER — ARNUITY ELLIPTA 200 MCG/ACT IN AEPB: 1.0000 | INHALATION_SPRAY | Freq: Every day | RESPIRATORY_TRACT | 3 refills | Status: DC

## 2021-03-12 NOTE — Patient Instructions (Signed)
ICD-10-CM   1. Mild persistent asthma without complication  F48.34     2. History of influenza  Z87.09     3. History of pneumococcal pneumonia  Z87.01      ASthma flare up improved Flu and pneumoocccal pneumonia resolved Physical conditioning is good  Plan  - ok to go by car to [place] but no  driving (just ride) - hold off labs based on shared decision making 03/12/2021 - continue arnuity scheduled  Followup  - 6 months or sooner if needed

## 2021-03-12 NOTE — Progress Notes (Signed)
OV 03/12/2021  Subjective:  Patient ID: [name] Spence, MD, male , DOB: 1929/08/30 , age 88 y.o. , MRN: [id number] , ADDRESS: [address] [place] [address] PCP [name], MD Patient Care Team: [name], MD as PCP - General (Family Medicine) Franchot Gallo, MD as Consulting Physician (Urology) Evans Lance, MD as Consulting Physician (Cardiology) Shon Hough, MD as Consulting Physician (Ophthalmology)  This Provider for this visit: Treatment Team:  Attending Provider: Brand Males, MD    03/12/2021 -   Chief Complaint  Patient presents with   Hospitalization Follow-up    Pt recently in the hospital with flu and pneumonia.  Pt states that he has been doing better since he has been out of the hospital. States he has some coughing but that has also improved. Also states that he has a mild wheeze when has mucus in chest.     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  He follows with me for asthma mild persistent.  He takes Arnuity for this.  He has atrial fibrillation and therefore avoids albuterol.  He states asthma is under control.  He went to a church and then 5 or 6 days later [facility] had removed his mask in the church] he got influenza A.  He was hospitalized for influenza and pneumococcal pneumonia.  He has recovered.  Most recent chest x-ray looks fairly clear except some mild basal infiltrate.  He keeps himself physically fit working with a Clinical research associate and walking on treadmill and lifting weights.  He feels this was the reason he quickly recovered.  He is not on oxygen.  He says that once they put him on albuterol in the hospital his atrial fibrillation went out of control.  He is back in sinus rhythm.  He stopped albuterol.  He try to get Xopenex refill at the pharmacy but it was expensive.  He says he was just managed with Arnuity.  We talked about adding Singulair or considering biologic therapy for asthma but at this point he  does not feel the need.  Tomorrow his wife is going to drive him for the winter to [place].  They are going to stay overnight in [place].  They wanted to know if it was okay to go.  They are going to stop overnight as well.  They want to take frequent breaks.  He feels physically fit.  He says he is able to do his ADLs.  He did walk and show me.  He did not stumble.  He did not look physically weak.  He looked the usual baseline.    CT Chest data  No results found.    PFT  No flowsheet data found.     has a past medical history of Allergic rhinitis, Anxiety, Asthma, Atrial fibrillation ([place]), CAD (coronary artery disease), CHF (congestive heart failure) ([place]), Depression, Diverticulosis (03/16/1995), DJD (degenerative joint disease), GERD (gastroesophageal reflux disease) (12/30/2000), Heart attack ([place]) (1984), Hiatal hernia (12/30/2000), History of blood transfusion (1988), History of duodenal ulcer (10/15/1986), Hyperlipidemia, Hypertension, Sepsis ([place]) (12/2018), Sinus bradycardia, and Stroke ([place]).   reports that he has never smoked. He has never used smokeless tobacco.  Past Surgical History:  Procedure Laterality Date   CARDIAC CATHETERIZATION     CATARACT EXTRACTION W/ INTRAOCULAR LENS  IMPLANT, BILATERAL Bilateral ?2013   [place] WITH STENT PLACEMENT  2010   hospital ccu  1984   heart  attack, hypoplastic right coronary   INGUINAL HERNIA REPAIR Right ~ [place]   ORCHIECTOMY Bilateral 06/10/2017   Procedure: BILATERAL ORCHIECTOMY;  Surgeon: Franchot Gallo, MD;  Location: [facility];  Service: Urology;  Laterality: Bilateral;  MAC ANESTHESIA AND LOCAL   TONSILLECTOMY     TOTAL KNEE ARTHROPLASTY Bilateral 1998-2004   "right-left"   VAGOTOMY AND PYLOROPLASTY  1988   "bleeding duodenal ulcer"    Allergies  Allergen Reactions   Tylenol [Acetaminophen] Rash   Azithromycin    Advil [Ibuprofen] Nausea Only  and Rash   Asa [Aspirin] Rash    Immunization History  Administered Date(s) Administered   Fluad Quad(high Dose 65+) 10/17/2018   Influenza Split 10/23/2012, 10/02/2015   Influenza Whole 10/27/2007, 11/13/2008, 10/29/2009, 11/09/2010   Influenza, High Dose Seasonal PF 10/28/2016, 11/13/2020   Influenza,inj,Quad PF,6+ Mos 10/19/2017   Influenza-Unspecified 12/09/2013, 10/28/2014, 11/12/2019   PFIZER(Purple Top)SARS-COV-2 Vaccination 03/19/2019, 04/08/2019, 09/27/2019   Pneumococcal Conjugate-13 09/17/2013   Pneumococcal Polysaccharide-23 08/30/2000, 11/29/2005   Td 06/20/2007, 01/11/2018   Tdap 01/11/2018   Zoster Recombinat (Shingrix) 11/07/2017, 11/22/2017, 01/11/2018   Zoster, Live 06/20/2007    Family History  Problem Relation Age of Onset   Coronary artery disease Mother        deceased   Deep vein thrombosis Sister    Coronary artery disease Brother    Asthma Brother    Colon cancer Neg Hx      Current Outpatient Medications:    albuterol (PROVENTIL) (2.5 MG/3ML) 0.083% nebulizer solution, Inhale 3 mLs (2.5 mg total) into the lungs every 4 (four) hours as needed for wheezing or shortness of breath., Disp: 75 mL, Rfl: 0   albuterol (VENTOLIN HFA) 108 (90 Base) MCG/ACT inhaler, Inhale 2 puffs into the lungs every 4 (four) hours as needed for wheezing or shortness of breath., Disp: 18 g, Rfl: 2   apixaban (ELIQUIS) 5 MG TABS tablet, Take 1 tablet (5 mg total) by mouth 2 (two) times daily., Disp: 180 tablet, Rfl: 3   atorvastatin (LIPITOR) 20 MG tablet, Take 1 tablet by mouth once daily, Disp: 90 tablet, Rfl: 3   buPROPion (WELLBUTRIN SR) 150 MG 12 hr tablet, TAKE 1 TABLET(150 MG) BY MOUTH DAILY, Disp: 90 tablet, Rfl: 3   cetirizine (ZYRTEC) 10 MG tablet, Take 10 mg by mouth daily with supper. , Disp: , Rfl:    Cholecalciferol (VITAMIN D) 2000 units tablet, Take 2,000 Units by mouth daily., Disp: , Rfl:    dofetilide (TIKOSYN) 250 MCG capsule, TAKE 1 CAPSULE(250 MCG) BY MOUTH  TWICE DAILY, Disp: 120 capsule, Rfl: 3   FLUoxetine (PROZAC) 10 MG capsule, Take 1 capsule (10 mg total) by mouth daily., Disp: 90 capsule, Rfl: 3   fluticasone (FLONASE) 50 MCG/ACT nasal spray, SHAKE LIQUID AND USE 2 SPRAYS IN EACH NOSTRIL DAILY, Disp: 48 g, Rfl: 3   LORazepam (ATIVAN) 1 MG tablet, TAKE 1/2 TO 1 TABLET BY MOUTH AT BEDTIME AS NEEDED, Disp: 90 tablet, Rfl: 1   nitroGLYCERIN (NITROSTAT) 0.4 MG SL tablet, Place 1 tablet (0.4 mg total) under the tongue every 5 (five) minutes x 3 doses as needed for chest pain., Disp: 25 tablet, Rfl: 2   omeprazole (PRILOSEC OTC) 20 MG tablet, Take 1 tablet (20 mg total) by mouth daily., Disp: 100 tablet, Rfl: 4   predniSONE (DELTASONE) 10 MG tablet, Take 4 tablets (40 mg total) by mouth daily with breakfast for 2 days, THEN 3 tablets (30 mg total)  daily with breakfast for 2 days, THEN 2 tablets (20 mg total) daily with breakfast for 2 days, THEN 1 tablet (10 mg total) daily with breakfast for 2 days., Disp: 20 tablet, Rfl: 0   tamsulosin (FLOMAX) 0.4 MG CAPS capsule, TAKE 1 CAPSULE(0.4 MG) BY MOUTH DAILY, Disp: 90 capsule, Rfl: 3   Fluticasone Furoate (ARNUITY ELLIPTA) 200 MCG/ACT AEPB, Inhale 1 puff into the lungs daily., Disp: 180 each, Rfl: 3      Objective:   Vitals:   03/12/21 1313  BP: 120/68  Pulse: 75  SpO2: 98%  Weight: 154 lb (69.9 kg)  Height: _0  (1.778 m)    Estimated body mass index is 22.1 kg/m as calculated from the following:   Height as of this encounter: _1  (1.778 m).   Weight as of this encounter: 154 lb (69.9 kg).  _2 @  Filed Weights   03/12/21 1313  Weight: 154 lb (69.9 kg)     Physical Exam  General: No distress. Thin, looks well Neuro: Alert and Oriented x 3. GCS 15. Speech normal Psych: Pleasant Resp:  Barrel Chest - no.  Wheeze -may be some small focal wheezing in the left upper lobe, Crackles - no, No overt respiratory distress CVS: Normal heart sounds. Murmurs - no Ext: Stigmata of  Connective Tissue Disease - no HEENT: Normal upper airway. PEERL +. No post nasal drip        Assessment:       ICD-10-CM   1. Mild persistent asthma without complication  W25.85     2. History of influenza  Z87.09     3. History of pneumococcal pneumonia  Z87.01     4. Travel advice encounter  Z71.84          Plan:     Patient Instructions     ICD-10-CM   1. Mild persistent asthma without complication  I77.82     2. History of influenza  Z87.09     3. History of pneumococcal pneumonia  Z87.01      ASthma flare up improved Flu and pneumoocccal pneumonia resolved Physical conditioning is good  Plan  - ok to go by car to [place] but no  driving (just ride) - hold off labs based on shared decision making 03/12/2021 - continue arnuity scheduled  Followup  - 6 months or sooner if needed     SIGNATURE    Dr. Brand Males, M.D., F.C.C.P,  Pulmonary and Critical Care Medicine Staff Physician, [place] Director - Interstitial Lung Disease  Program  Pulmonary [place] at [place], [place], [address]  Pager: [phone], If no answer or between  15:00h - 7:00h: call [phone] Telephone: [phone]  2:04 PM 03/12/2021

## 2021-04-07 DIAGNOSIS — R053 Chronic cough: Secondary | ICD-10-CM | POA: Diagnosis not present

## 2021-04-07 DIAGNOSIS — I5032 Chronic diastolic (congestive) heart failure: Secondary | ICD-10-CM | POA: Diagnosis not present

## 2021-04-07 DIAGNOSIS — J45909 Unspecified asthma, uncomplicated: Secondary | ICD-10-CM | POA: Diagnosis not present

## 2021-04-10 ENCOUNTER — Ambulatory Visit: Payer: PPO | Admitting: Internal Medicine

## 2021-04-15 ENCOUNTER — Ambulatory Visit: Payer: PPO

## 2021-04-15 ENCOUNTER — Ambulatory Visit (INDEPENDENT_AMBULATORY_CARE_PROVIDER_SITE_OTHER): Payer: PPO

## 2021-04-15 VITALS — Ht 70.0 in | Wt 154.0 lb

## 2021-04-15 DIAGNOSIS — Z0000 Encounter for general adult medical examination without abnormal findings: Secondary | ICD-10-CM | POA: Diagnosis not present

## 2021-04-15 NOTE — Patient Instructions (Addendum)
[name] [name] , Thank you for taking time to come for your Medicare Wellness Visit. I appreciate your ongoing commitment to your health goals. Please review the following plan we discussed and let me know if I can assist you in the future.   These are the goals we discussed:  Goals       Patient Stated (pt-stated)      Maintain health.      Patient Stated      None at this time         This is a list of the screening recommended for you and due dates:  Health Maintenance  Topic Date Due   COVID-19 Vaccine (4 - Booster for Pfizer series) 11/22/2019   Tetanus Vaccine  02/05/2028   Pneumonia Vaccine  Completed   Flu Shot  Completed   Zoster (Shingles) Vaccine  Completed   HPV Vaccine  Aged Out   Advanced directives: Yes Patient will bring copy  Conditions/risks identified: None  Next appointment: Follow up in one year for your annual wellness visit.   Preventive Care 50 Years and Older, Male Preventive care refers to lifestyle choices and visits with your health care provider that can promote health and wellness. What does preventive care include? A yearly physical exam. This is also called an annual well check. Dental exams once or twice a year. Routine eye exams. Ask your health care provider how often you should have your eyes checked. Personal lifestyle choices, including: Daily care of your teeth and gums. Regular physical activity. Eating a healthy diet. Avoiding tobacco and drug use. Limiting alcohol use. Practicing safe sex. Taking low doses of aspirin every day. Taking vitamin and mineral supplements as recommended by your health care provider. What happens during an annual well check? The services and screenings done by your health care provider during your annual well check will depend on your age, overall health, lifestyle risk factors, and family history of disease. Counseling  Your health care provider may ask you questions about your: Alcohol use. Tobacco  use. Drug use. Emotional well-being. Home and relationship well-being. Sexual activity. Eating habits. History of falls. Memory and ability to understand (cognition). Work and work Statistician. Screening  You may have the following tests or measurements: Height, weight, and BMI. Blood pressure. Lipid and cholesterol levels. These may be checked every 5 years, or more frequently if you are over 24 years old. Skin check. Lung cancer screening. You may have this screening every year starting at age 54 if you have a 30-pack-year history of smoking and currently smoke or have quit within the past 15 years. Fecal occult blood test (FOBT) of the stool. You may have this test every year starting at age 73. Flexible sigmoidoscopy or colonoscopy. You may have a sigmoidoscopy every 5 years or a colonoscopy every 10 years starting at age 42. Prostate cancer screening. Recommendations will vary depending on your family history and other risks. Hepatitis C blood test. Hepatitis B blood test. Sexually transmitted disease (STD) testing. Diabetes screening. This is done by checking your blood sugar (glucose) after you have not eaten for a while (fasting). You may have this done every 1-3 years. Abdominal aortic aneurysm (AAA) screening. You may need this if you are a current or former smoker. Osteoporosis. You may be screened starting at age 72 if you are at high risk. Talk with your health care provider about your test results, treatment options, and if necessary, the need for more tests. Vaccines  Your  health care provider may recommend certain vaccines, such as: Influenza vaccine. This is recommended every year. Tetanus, diphtheria, and acellular pertussis (Tdap, Td) vaccine. You may need a Td booster every 10 years. Zoster vaccine. You may need this after age 12. Pneumococcal 13-valent conjugate (PCV13) vaccine. One dose is recommended after age 1. Pneumococcal polysaccharide (PPSV23) vaccine.  One dose is recommended after age 59. Talk to your health care provider about which screenings and vaccines you need and how often you need them. This information is not intended to replace advice given to you by your health care provider. Make sure you discuss any questions you have with your health care provider. Document Released: 02/21/2015 Document Revised: 10/15/2015 Document Reviewed: 11/26/2014 Elsevier Interactive Patient Education  2017 [employer] Prevention in the Home Falls can cause injuries. They can happen to people of all ages. There are many things you can do to make your home safe and to help prevent falls. What can I do on the outside of my home? Regularly fix the edges of walkways and driveways and fix any cracks. Remove anything that might make you trip as you walk through a door, such as a raised step or threshold. Trim any bushes or trees on the path to your home. Use bright outdoor lighting. Clear any walking paths of anything that might make someone trip, such as rocks or tools. Regularly check to see if handrails are loose or broken. Make sure that both sides of any steps have handrails. Any raised decks and porches should have guardrails on the edges. Have any leaves, snow, or ice cleared regularly. Use sand or salt on walking paths during winter. Clean up any spills in your garage right away. This includes oil or grease spills. What can I do in the bathroom? Use night lights. Install grab bars by the toilet and in the tub and shower. Do not use towel bars as grab bars. Use non-skid mats or decals in the tub or shower. If you need to sit down in the shower, use a plastic, non-slip stool. Keep the floor dry. Clean up any water that spills on the floor as soon as it happens. Remove soap buildup in the tub or shower regularly. Attach bath mats securely with double-sided non-slip rug tape. Do not have throw rugs and other things on the floor that can make  you trip. What can I do in the bedroom? Use night lights. Make sure that you have a light by your bed that is easy to reach. Do not use any sheets or blankets that are too big for your bed. They should not hang down onto the floor. Have a firm chair that has side arms. You can use this for support while you get dressed. Do not have throw rugs and other things on the floor that can make you trip. What can I do in the kitchen? Clean up any spills right away. Avoid walking on wet floors. Keep items that you use a lot in easy-to-reach places. If you need to reach something above you, use a strong step stool that has a grab bar. Keep electrical cords out of the way. Do not use floor polish or wax that makes floors slippery. If you must use wax, use non-skid floor wax. Do not have throw rugs and other things on the floor that can make you trip. What can I do with my stairs? Do not leave any items on the stairs. Make sure that there are handrails  on both sides of the stairs and use them. Fix handrails that are broken or loose. Make sure that handrails are as long as the stairways. Check any carpeting to make sure that it is firmly attached to the stairs. Fix any carpet that is loose or worn. Avoid having throw rugs at the top or bottom of the stairs. If you do have throw rugs, attach them to the floor with carpet tape. Make sure that you have a light switch at the top of the stairs and the bottom of the stairs. If you do not have them, ask someone to add them for you. What else can I do to help prevent falls? Wear shoes that: Do not have high heels. Have rubber bottoms. Are comfortable and fit you well. Are closed at the toe. Do not wear sandals. If you use a stepladder: Make sure that it is fully opened. Do not climb a closed stepladder. Make sure that both sides of the stepladder are locked into place. Ask someone to hold it for you, if possible. Clearly mark and make sure that you can  see: Any grab bars or handrails. First and last steps. Where the edge of each step is. Use tools that help you move around (mobility aids) if they are needed. These include: Canes. Walkers. Scooters. Crutches. Turn on the lights when you go into a dark area. Replace any light bulbs as soon as they burn out. Set up your furniture so you have a clear path. Avoid moving your furniture around. If any of your floors are uneven, fix them. If there are any pets around you, be aware of where they are. Review your medicines with your doctor. Some medicines can make you feel dizzy. This can increase your chance of falling. Ask your doctor what other things that you can do to help prevent falls. This information is not intended to replace advice given to you by your health care provider. Make sure you discuss any questions you have with your health care provider. Document Released: 11/21/2008 Document Revised: 07/03/2015 Document Reviewed: 03/01/2014 Elsevier Interactive Patient Education  2017 [employer].

## 2021-04-15 NOTE — Progress Notes (Addendum)
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  Review of Systems    Virtual Visit via Telephone Note  I connected with  [name] Spence, MD on 04/15/21 at  3:30 PM EST by telephone and verified that I am speaking with the correct person using two identifiers.  Location: Patient: Home Provider: Office Persons participating in the virtual visit: patient/Nurse Health Advisor   I discussed the limitations, risks, security and privacy concerns of performing an evaluation and management service by telephone and the availability of in person appointments. The patient expressed understanding and agreed to proceed.  Interactive audio and video telecommunications were attempted between this nurse and patient, however failed, due to patient having technical difficulties OR patient did not have access to video capability.  We continued and completed visit with audio only.  Some vital signs may be absent or patient reported.   Criselda Peaches, LPN  Cardiac Risk Factors include: advanced age (>6mn, >>81women);male gender;hypertension;Other (see comment), Risk factor comments: Heart Disease     Objective:    Today's Vitals   04/15/21 1530  Weight: 154 lb (69.9 kg)  Height: _0  (1.778 m)   Body mass index is 22.1 kg/m.  Advanced Directives 04/15/2021 03/05/2021 04/09/2020 03/26/2019 12/27/2018 03/20/2018 03/08/2018  Does Patient Have a Medical Advance Directive? _1  Yes Yes  Type of AParamedicof APost FallsLiving will [place] ParkLiving will [place]Living will - HSun Valley LakeLiving will [place]Living will Living will;Healthcare Power of Attorney  Does patient want to make changes to medical advance directive? No - Patient declined No - Patient declined - No - Patient declined No - Patient declined No - Patient  declined No - Patient declined  Copy of [place] Chart? No - copy requested No - copy requested No - copy requested - No - copy requested No - copy requested No - copy requested  Would patient like information on creating a medical advance directive? - - - - - - -    Current Medications (verified) Outpatient Encounter Medications as of 04/15/2021  Medication Sig   albuterol (PROVENTIL) (2.5 MG/3ML) 0.083% nebulizer solution Inhale 3 mLs (2.5 mg total) into the lungs every 4 (four) hours as needed for wheezing or shortness of breath.   albuterol (VENTOLIN HFA) 108 (90 Base) MCG/ACT inhaler Inhale 2 puffs into the lungs every 4 (four) hours as needed for wheezing or shortness of breath.   apixaban (ELIQUIS) 5 MG TABS tablet Take 1 tablet (5 mg total) by mouth 2 (two) times daily.   atorvastatin (LIPITOR) 20 MG tablet Take 1 tablet by mouth once daily   buPROPion (WELLBUTRIN SR) 150 MG 12 hr tablet TAKE 1 TABLET(150 MG) BY MOUTH DAILY   cetirizine (ZYRTEC) 10 MG tablet Take 10 mg by mouth daily with supper.    Cholecalciferol (VITAMIN D) 2000 units tablet Take 2,000 Units by mouth daily.   dofetilide (TIKOSYN) 250 MCG capsule TAKE 1 CAPSULE(250 MCG) BY MOUTH TWICE DAILY   FLUoxetine (PROZAC) 10 MG capsule Take 1 capsule (10 mg total) by mouth daily.   fluticasone (FLONASE) 50 MCG/ACT nasal spray SHAKE LIQUID AND USE 2 SPRAYS IN EACH NOSTRIL DAILY   Fluticasone Furoate (ARNUITY ELLIPTA) 200 MCG/ACT AEPB Inhale 1 puff into the lungs daily.   LORazepam (ATIVAN) 1 MG tablet TAKE 1/2 TO 1  TABLET BY MOUTH AT BEDTIME AS NEEDED   nitroGLYCERIN (NITROSTAT) 0.4 MG SL tablet Place 1 tablet (0.4 mg total) under the tongue every 5 (five) minutes x 3 doses as needed for chest pain.   omeprazole (PRILOSEC OTC) 20 MG tablet Take 1 tablet (20 mg total) by mouth daily.   tamsulosin (FLOMAX) 0.4 MG CAPS capsule TAKE 1 CAPSULE(0.4 MG) BY MOUTH DAILY   No facility-administered encounter  medications on file as of 04/15/2021.    Allergies (verified) Tylenol [acetaminophen], Azithromycin, Advil [ibuprofen], and Asa [aspirin]   History: Past Medical History:  Diagnosis Date   Allergic rhinitis    Anxiety    Asthma    Atrial fibrillation ([place])    CAD (coronary artery disease)    CHF (congestive heart failure) ([place])    Depression    Diverticulosis 03/16/1995   DJD (degenerative joint disease)    "most joints" (03/11/2015)   GERD (gastroesophageal reflux disease) 12/30/2000   Heart attack ([place]) 1984   Hiatal hernia 12/30/2000   History of blood transfusion 1988   "related to GI bleeding OR"   History of duodenal ulcer 10/15/1986   Hyperlipidemia    Hypertension    Sepsis ([place]) 12/2018   Sinus bradycardia    Stroke [facility])    Past Surgical History:  Procedure Laterality Date   CARDIAC CATHETERIZATION     CATARACT EXTRACTION W/ INTRAOCULAR LENS  IMPLANT, BILATERAL Bilateral ?2013   [place] WITH STENT PLACEMENT  2010   hospital ccu  1984   heart attack, hypoplastic right coronary   INGUINAL HERNIA REPAIR Right ~ [facility] Bilateral 06/10/2017   Procedure: BILATERAL ORCHIECTOMY;  Surgeon: Franchot Gallo, MD;  Location: [facility];  Service: Urology;  Laterality: Bilateral;  MAC ANESTHESIA AND LOCAL   TONSILLECTOMY     TOTAL KNEE ARTHROPLASTY Bilateral 1998-2004   "right-left"   VAGOTOMY AND PYLOROPLASTY  1988   "bleeding duodenal ulcer"   Family History  Problem Relation Age of Onset   Coronary artery disease Mother        deceased   Deep vein thrombosis Sister    Coronary artery disease Brother    Asthma Brother    Colon cancer Neg Hx    Social History   Socioeconomic History   Marital status: Married    Spouse name: Not on file   Number of children: 2   Years of education: Not on file   Highest education level: Doctorate  Occupational History   Occupation: Acupuncturist: RETIRED    Comment: radiologist  Tobacco Use   Smoking status: Never   Smokeless tobacco: Never  Vaping Use   Vaping Use: Never used  Substance and Sexual Activity   Alcohol use: No   Drug use: No   Sexual activity: Not Currently  Other Topics Concern   Not on file  Social History Narrative   Married. Regular exercise - yes.       03/26/2019: Married to Therapist, sports, lives in 3 story home, although lives on first level mostly. Has two grown children.      Enjoys golfing; drives to [place] with wife every winter for a few days, watches sports, following Merchandiser, retail, reads about WWII,    Social Determinants of Radio broadcast assistant Strain: Low Risk    Difficulty of Paying Living Expenses: Not hard at all  Food Insecurity: No Food Insecurity  Worried About Charity fundraiser in the Last Year: Never true   [place] in the Last Year: Never true  Transportation Needs: No Transportation Needs   Lack of Transportation (Medical): No   Lack of Transportation (Non-Medical): No  Physical Activity: Insufficiently Active   Days of Exercise per Week: 2 days   Minutes of Exercise per Session: 40 min  Stress: No Stress Concern Present   Feeling of Stress : Not at all  Social Connections: Socially Integrated   Frequency of Communication with Friends and Family: More than three times a week   Frequency of Social Gatherings with Friends and Family: More than three times a week   Attends Religious Services: More than 4 times per year   Active Member of [employer] or Organizations: Yes   Attends Music therapist: More than 4 times per year   Marital Status: Married     Clinical Intake:  Pre-visit preparation completed: NoHow often do you need to have someone help you when you read instructions, pamphlets, or other written materials from your doctor or pharmacy?: 1 - Never  Diabetic?  No   Activities of Daily Living In your present state of health, do you have  any difficulty performing the following activities: 04/15/2021 03/05/2021  Hearing? N N  Vision? N N  Difficulty concentrating or making decisions? N N  Walking or climbing stairs? N N  Dressing or bathing? N N  Doing errands, shopping? N N  Preparing Food and eating ? N -  Using the Toilet? N -  In the past six months, have you accidently leaked urine? N -  Do you have problems with loss of bowel control? N -  Managing your Medications? N -  Managing your Finances? N -  Housekeeping or managing your Housekeeping? N -  Some recent data might be hidden    Patient Care Team: [name], MD as PCP - General (Family Medicine) Franchot Gallo, MD as Consulting Physician (Urology) Evans Lance, MD as Consulting Physician (Cardiology) Shon Hough, MD as Consulting Physician (Ophthalmology)  Indicate any recent Medical Services you may have received from other than [facility] providers in the past year (date may be approximate).     Assessment:   This is a routine wellness examination for [facility]  Hearing/Vision screen Hearing Screening - Comments:: Wears hearing aids Vision Screening - Comments:: Wears reading glasses. Followed by Dr Lavonia Drafts  Dietary issues and exercise activities discussed: Exercise limited by: None identified   Goals Addressed               This Visit's Progress     Patient Stated (pt-stated)        Maintain health.       Depression Screen PHQ 2/9 Scores 04/15/2021 04/09/2020 03/26/2019 03/08/2018 04/12/2016 12/29/2015 11/27/2015  PHQ - 2 Score 0 0 0 1 0 0 0  PHQ- 9 Score - - - 3 - - -  Exception Documentation - - - - Other- indicate reason in comment box - Other- indicate reason in comment box    Fall Risk Fall Risk  04/15/2021 04/10/2021 04/09/2020 03/26/2019 03/08/2018  Falls in the past year? 0 0 0 0 1  Number falls in past yr: 0 - 0 - 0  Injury with Fall? 0 - 0 - 1  Comment - - - - rug burn  Risk for fall due to : No Fall Risks - Impaired  vision;Impaired balance/gait;Impaired mobility Medication side effect  History of fall(s);Impaired balance/gait;Impaired vision  Follow up - - Falls prevention discussed Falls evaluation completed;Education provided;Falls prevention discussed Falls prevention discussed;Education provided    FALL RISK PREVENTION PERTAINING TO THE HOME:  Any stairs in or around the home? Yes  If so, are there any without handrails? No  Home free of loose throw rugs in walkways, pet beds, electrical cords, etc? Yes  Adequate lighting in your home to reduce risk of falls? Yes   ASSISTIVE DEVICES UTILIZED TO PREVENT FALLS:  Life alert? No  Use of a cane, walker or w/c? No  Grab bars in the bathroom? Yes  Shower chair or bench in shower? Yes  Elevated toilet seat or a handicapped toilet? Yes   TIMED UP AND GO:  Was the test performed? No . Audio Visit  Cognitive Function:     6CIT Screen 04/15/2021 04/09/2020 03/26/2019  What Year? 0 points 0 points 0 points  What month? 0 points 0 points 0 points  What time? 0 points - 0 points  Count back from 20 0 points 0 points 0 points  Months in reverse 0 points 0 points 0 points  Repeat phrase 0 points 0 points 0 points  Total Score 0 - 0    Immunizations Immunization History  Administered Date(s) Administered   Fluad Quad(high Dose 65+) 10/17/2018   Influenza Split 10/23/2012, 10/02/2015   Influenza Whole 10/27/2007, 11/13/2008, 10/29/2009, 11/09/2010   Influenza, High Dose Seasonal PF 10/28/2016, 11/13/2020   Influenza,inj,Quad PF,6+ Mos 10/19/2017   Influenza-Unspecified 12/09/2013, 10/28/2014, 11/12/2019   PFIZER(Purple Top)SARS-COV-2 Vaccination 03/19/2019, 04/08/2019, 09/27/2019   Pneumococcal Conjugate-13 09/17/2013   Pneumococcal Polysaccharide-23 08/30/2000, 11/29/2005   Td 06/20/2007, 01/11/2018   Tdap 01/11/2018   Zoster Recombinat (Shingrix) 11/07/2017, 11/22/2017, 01/11/2018   Zoster, Live 06/20/2007    TDAP status: Up to date  Flu  Vaccine status: Up to date  Pneumococcal vaccine status: Up to date  Covid-19 vaccine status: Completed vaccines  Qualifies for Shingles Vaccine? Yes   Zostavax completed Yes   Shingrix Completed?: Yes  Screening Tests Health Maintenance  Topic Date Due   COVID-19 Vaccine (4 - Booster for Pfizer series) 11/22/2019   TETANUS/TDAP  02/05/2028   Pneumonia Vaccine 75+ Years old  Completed   INFLUENZA VACCINE  Completed   Zoster Vaccines- Shingrix  Completed   HPV VACCINES  Aged Out    Health Maintenance  Health Maintenance Due  Topic Date Due   COVID-19 Vaccine (4 - Booster for Pfizer series) 11/22/2019    Colorectal cancer screening: No longer required.   Lung Cancer Screening: (Low Dose CT Chest recommended if Age 52-80 years, 30 pack-year currently smoking OR have quit w/in 15years.) does not qualify.     Additional Screening:  Hepatitis C Screening: does not qualify; Completed   Vision Screening: Recommended annual ophthalmology exams for early detection of glaucoma and other disorders of the eye. Is the patient up to date with their annual eye exam?  Yes  Who is the provider or what is the name of the office in which the patient attends annual eye exams? Dr Harvin Hazel If pt is not established with a provider, would they like to be referred to a provider to establish care? No .   Dental Screening: Recommended annual dental exams for proper oral hygiene  Community Resource Referral / Chronic Care Management:  CRR required this visit?  No   CCM required this visit?  No      Plan:     I  have personally reviewed and noted the following in the patients chart:   Medical and social history Use of alcohol, tobacco or illicit drugs  Current medications and supplements including opioid prescriptions. Patient is not currently taking opioid prescriptions. Functional ability and status Nutritional status Physical activity Advanced directives List of other  physicians Hospitalizations, surgeries, and ER visits in previous 12 months Vitals Screenings to include cognitive, depression, and falls Referrals and appointments  In addition, I have reviewed and discussed with patient certain preventive protocols, quality metrics, and best practice recommendations. A written personalized care plan for preventive services as well as general preventive health recommendations were provided to patient.     Criselda Peaches, LPN   08/13/6431   Nurse Notes: None

## 2021-05-09 ENCOUNTER — Other Ambulatory Visit (HOSPITAL_COMMUNITY): Payer: Self-pay | Admitting: Cardiology

## 2021-05-27 ENCOUNTER — Ambulatory Visit (HOSPITAL_COMMUNITY)
Admission: RE | Admit: 2021-05-27 | Discharge: 2021-05-27 | Disposition: A | Discharge status: Home / Self Care | Payer: PPO | Source: Ambulatory Visit | Attending: Cardiology | Admitting: Cardiology

## 2021-05-27 ENCOUNTER — Encounter (HOSPITAL_COMMUNITY): Payer: Self-pay | Admitting: Cardiology

## 2021-05-27 VITALS — BP 110/68 | HR 68 | Wt 154.4 lb

## 2021-05-27 DIAGNOSIS — I25119 Atherosclerotic heart disease of native coronary artery with unspecified angina pectoris: Secondary | ICD-10-CM | POA: Diagnosis not present

## 2021-05-27 DIAGNOSIS — E785 Hyperlipidemia, unspecified: Secondary | ICD-10-CM | POA: Diagnosis not present

## 2021-05-27 DIAGNOSIS — I130 Hypertensive heart and chronic kidney disease with heart failure and stage 1 through stage 4 chronic kidney disease, or unspecified chronic kidney disease: Secondary | ICD-10-CM | POA: Diagnosis not present

## 2021-05-27 DIAGNOSIS — I071 Rheumatic tricuspid insufficiency: Secondary | ICD-10-CM | POA: Diagnosis not present

## 2021-05-27 DIAGNOSIS — Z7901 Long term (current) use of anticoagulants: Secondary | ICD-10-CM | POA: Diagnosis not present

## 2021-05-27 DIAGNOSIS — I5032 Chronic diastolic (congestive) heart failure: Secondary | ICD-10-CM | POA: Insufficient documentation

## 2021-05-27 DIAGNOSIS — Z79899 Other long term (current) drug therapy: Secondary | ICD-10-CM | POA: Insufficient documentation

## 2021-05-27 DIAGNOSIS — I480 Paroxysmal atrial fibrillation: Secondary | ICD-10-CM | POA: Diagnosis not present

## 2021-05-27 DIAGNOSIS — E7800 Pure hypercholesterolemia, unspecified: Secondary | ICD-10-CM | POA: Diagnosis not present

## 2021-05-27 LAB — CBC
HCT: 36.6 % — ABNORMAL LOW (ref 39.0–52.0)
Hemoglobin: 11.3 g/dL — ABNORMAL LOW (ref 13.0–17.0)
MCH: 29.9 pg (ref 26.0–34.0)
MCHC: 30.9 g/dL (ref 30.0–36.0)
MCV: 96.8 fL (ref 80.0–100.0)
Platelets: 220 10*3/uL (ref 150–400)
RBC: 3.78 MIL/uL — ABNORMAL LOW (ref 4.22–5.81)
RDW: 13.7 % (ref 11.5–15.5)
WBC: 7.3 10*3/uL (ref 4.0–10.5)
nRBC: 0 % (ref 0.0–0.2)

## 2021-05-27 LAB — BASIC METABOLIC PANEL
Anion gap: 6 (ref 5–15)
BUN: 28 mg/dL — ABNORMAL HIGH (ref 8–23)
CO2: 23 mmol/L (ref 22–32)
Calcium: 8.6 mg/dL — ABNORMAL LOW (ref 8.9–10.3)
Chloride: 108 mmol/L (ref 98–111)
Creatinine, Ser: 1.37 mg/dL — ABNORMAL HIGH (ref 0.61–1.24)
GFR, Estimated: 49 mL/min — ABNORMAL LOW (ref >60)
Glucose, Bld: 119 mg/dL — ABNORMAL HIGH (ref 70–99)
Potassium: 4 mmol/L (ref 3.5–5.1)
Sodium: 137 mmol/L (ref 135–145)

## 2021-05-27 LAB — LIPID PANEL
Cholesterol: 83 mg/dL (ref 0–200)
HDL: 38 mg/dL — ABNORMAL LOW (ref >40)
LDL Cholesterol: 37 mg/dL (ref 0–99)
Total CHOL/HDL Ratio: 2.2 RATIO
Triglycerides: 38 mg/dL (ref <150)
VLDL: 8 mg/dL (ref 0–40)

## 2021-05-27 LAB — MAGNESIUM: Magnesium: 1.9 mg/dL (ref 1.7–2.4)

## 2021-05-27 NOTE — Progress Notes (Signed)
Patient ID: [name] Spence, MD, male   DOB: 1929/04/07, 88 y.o.   MRN: [id number] ?PCP: [name], MD ?Cardiology: Dr. Aundra Dubin ? ?88 y.o. retired radiologist with CAD s/p CFX PCI in 11/10 and paroxysmal atrial fibrillation presents for followup of CAD and atrial fibrillation.  He had an RV infarct with chronic total occlusion of a small nondominant RCA in the 1980s.  He had a drug-eluting stent to the CFX in 11/10. He is very symptomatic with atrial fibrillation episodes. He has had a chronic pattern of mild shortness of breath and mild left shoulder pain/chest aching when walking up a hill (x 20 years).   He can walk 3 miles on flat ground without problems.  He golfs 3-4 times a week.  He walks for 20-30 minutes on a treadmill several times a week.  ? ?Echo in 7/16 showed normal LV systolic function, EF 94-49% with moderate diastolic dysfunction.  Moderate TR with PA systolic pressure 65 mmHg.  Normal RV size and systolic function.  Given volume overload on exam and abnormal echo, he was started on low dose Lasix in the past.   ? ?He was started on Multaq but continued to have episodes of atrial fibrillation.  This was stopped, and he was admitted for Tikosyn loading.  After he went home, he developed left facial droop and left arm weakness.  He was found to have a small right parietal cortical infarct with hemorrhagic conversion.  His symptoms resolved quickly. INR was subtherapeutic at admission.  Warfarin was stopped and he was put on apixaban.  ? ?In 9/17, he was admitted with LLL PNA.  During the admission, he had a couple of relatively short runs of atrial fibrillation.  He developed more frequent episodes of atrial fibrillation at home and was started on Tikosyn.  ? ?In 5/19, he had bilateral orchiectomy for metastatic prostate cancer.  His PSA has come down considerably.  ? ?In 1/20, he had influenza that progressed to PNA.  He was hospitalized with transient atrial fibrillation.   ? ?In 11/20, he was  admitted with an E coli UTI and urosepsis with hypotension and AKI.  He was initially fluid-resuscitated and later required diuresis.  He was in atrial fibrillation while in the hospital.  ? ?Patient was admitted in 1/23 with influenza A as well as Strep pneumoniae PNA.  He was treated with oseltamivir and antibiotics. While in the hospital, he went into atrial fibrillation with controlled rate.   ? ?Patient returns for followup of atrial fibrillation.  He has been doing well recently.  Has not noted atrial fibrillation since last hospitalization.  Playing golf twice a week and walking with his wife.  He has pain in his left shoulder when he walks up a steep hill that resolves with rest.  This has been present chronically x years now with no change.  No lightheadedness.  No orthopnea/PND.  Weight is stable.  ? ?ECG (personally reviewed): NSR, QTc 469 msec ?  ?Labs (6/11): LDL 63, HDL 35, K 5.5, creatinine 1.1 ?Labs (6/12): LDL 58, HDL 53, TSH normal, K 4.7, creatinine 1 ?Labs (12/12): BNP 79 ?Labs (7/14): K 5, creatinine 1.1, LDL 48, HDL 34 ?Labs (8/15): LDL 48, HDL 33, TSH normal, K 5.4, creatinine 1.2 ?Labs (6/16): K 5.1, creatinine 1.27, HCT 42.5 ?Labs (10/16): LDL 37, HDL 32 ?Labs (11/16): K 4.4, creatinine 1.36, BNP 103 ?Labs  (2/17): K 4.5, creatinine 1.38, Mg 2.0, LDL 32 ?Labs (3/17): HCT 41.7 ?Labs (5/17):  K 4.2, creatinine 1.17, Mg 2.1 ?Labs (9/17): K 4.2, creatinine 1.16, HCT 37.3 ?Labs (11/17): hgb 13.3, LDL 40 ?Labs (5/18): K 4.5, creatinine 1.25 ?Labs (5/19): K 5.5, creatinine 1.16 ?Labs (2/20): K 4, creatinine 1.24, hgb 12.2 ?Labs (11/20): K 3.4, creatinine 1.34, hgb 10.3, LDL 49 ?Labs (12/20): K 4.5, creatinine 1.2 ?Labs (2/21): K 3.9, creatinine 1.5, TSH 4.5 (mildly elevated) ?Labs (5/21): TSH normal, K 5, creatinine 1.27 ?Labs (9/21): K 4.1, creatinine 1.22, LDL 40, TSH normal ?Labs (4/22): K 4.2, creatinine 1.11 ?Labs (8/22): hgb 12, LDL 36 ?Labs (10/22): K 4.5, creatinine 1.38 ?Labs (1/23): K 4.6,  creatinine 1.15, hgb 11.4 ? ?PMH: ?1. CAD: RV infarct in the 1980s.  Chronic total occlusion of a small nondominant RCA.  11/10 PCI to CFX with Xience DES.  EF 60% on LV-gram at that time. ETT (11/14) with 9'07" exercise, no significant ST depression.  ?2.  Paroxysmal atrial fibrillation: Multaq was ineffective. Now on Tikosyn.  ?3.  Hyperlipidemia ?4. GERD ?5. HTN ?6. Asthma ?7. Chronic diastolic CHF: Echo (1/96): EF 55-60%, mild LV hypertrophy, no regional WMAs, mild AI, mild MR, mild-moderate biatrial enlargement, PA systolic pressure 51 mmHg. Echo (7/16) with EF 60-65%, moderate diastolic dysfunction, normal RV size and systolic function, mild-moderate MR, moderate TR, moderate biatrial enlargement, PA systolic pressure 65 mmHg.  ?- Echo (5/18): EF 55-60%, mild AI, mild MR, PASP 60 mmHg, normal RV size and systolic function.  ?- Echo (8/19): EF 60-65%, normal RV size and systolic function, severe TR with PASP 60 mmHg.  ?8. CKD ?9. CVA: Right parietal cortical CVA with peri-infarct hemorrhagic conversion.  CTA showed right M2 branch flow gap.   ?10. Prostate cancer: Metastatic.  ?- Bilateral orchiectomy in 5/19.  ? ?SH: Nonsmoker.  Retired Stage manager, lives in [place].  2 daughters.  Originally from [place].  ? ?FH: Brother with CABG.  ? ?ROS: All systems reviewed and negative except as per HPI.  ? ? ?Current Outpatient Medications  ?Medication Sig Dispense Refill  ? albuterol (PROVENTIL) (2.5 MG/3ML) 0.083% nebulizer solution Inhale 3 mLs (2.5 mg total) into the lungs every 4 (four) hours as needed for wheezing or shortness of breath. 75 mL 0  ? albuterol (VENTOLIN HFA) 108 (90 Base) MCG/ACT inhaler Inhale 2 puffs into the lungs every 4 (four) hours as needed for wheezing or shortness of breath. 18 g 2  ? atorvastatin (LIPITOR) 20 MG tablet Take 1 tablet by mouth once daily 90 tablet 3  ? buPROPion (WELLBUTRIN SR) 150 MG 12 hr tablet TAKE 1 TABLET(150 MG) BY MOUTH DAILY 90 tablet 3  ? cetirizine (ZYRTEC)  10 MG tablet Take 10 mg by mouth daily with supper.     ? Cholecalciferol (VITAMIN D) 2000 units tablet Take 2,000 Units by mouth daily.    ? dofetilide (TIKOSYN) 250 MCG capsule TAKE 1 CAPSULE(250 MCG) BY MOUTH TWICE DAILY 120 capsule 3  ? ELIQUIS 5 MG TABS tablet TAKE 1 TABLET(5 MG) BY MOUTH TWICE DAILY 180 tablet 3  ? FLUoxetine (PROZAC) 10 MG capsule Take 1 capsule (10 mg total) by mouth daily. 90 capsule 3  ? fluticasone (FLONASE) 50 MCG/ACT nasal spray SHAKE LIQUID AND USE 2 SPRAYS IN EACH NOSTRIL DAILY 48 g 3  ? Fluticasone Furoate (ARNUITY ELLIPTA) 200 MCG/ACT AEPB Inhale 1 puff into the lungs daily. 180 each 3  ? LORazepam (ATIVAN) 1 MG tablet TAKE 1/2 TO 1 TABLET BY MOUTH AT BEDTIME AS NEEDED 90 tablet 1  ? nitroGLYCERIN (NITROSTAT)  0.4 MG SL tablet Place 1 tablet (0.4 mg total) under the tongue every 5 (five) minutes x 3 doses as needed for chest pain. 25 tablet 2  ? omeprazole (PRILOSEC OTC) 20 MG tablet Take 1 tablet (20 mg total) by mouth daily. 100 tablet 4  ? tamsulosin (FLOMAX) 0.4 MG CAPS capsule TAKE 1 CAPSULE(0.4 MG) BY MOUTH DAILY 90 capsule 3  ? ?No current facility-administered medications for this encounter.  ? ? ?BP 110/68   Pulse 68   Wt 70 kg (154 lb 6.4 oz)   SpO2 100%   BMI 22.15 kg/m?  ?General: NAD ?Neck: No JVD, no thyromegaly or thyroid nodule.  ?Lungs: Clear to auscultation bilaterally with normal respiratory effort. ?CV: Nondisplaced PMI.  Heart regular S1/S2, no S3/S4, 2/6 HSM LLSB.  1+ ankle edema.  No carotid bruit.  Normal pedal pulses.  ?Abdomen: Soft, nontender, no hepatosplenomegaly, no distention.  ?Skin: Intact without lesions or rashes.  ?Neurologic: Alert and oriented x 3.  ?Psych: Normal affect. ?Extremities: No clubbing or cyanosis.  ?HEENT: Normal.  ? ?Assessment/Plan: ?1. CAD: He has chronic stable angina walking up a steep hill (manifests as left shoulder pain).  No change in pattern x 15 years.   ?- Continue medical management of angina, no indication for  stress test or cath.  ?- He is on apixaban without ASA given stable CAD.   ?- Continue statin, check lipids today.  ?2. Hyperlipidemia: He is on atorvastatin 20 mg daily, check lipids today.  ?3. Chronic dias

## 2021-05-27 NOTE — Patient Instructions (Signed)
There has been no changes to your medications. ? ?Labs done today, your results will be available in MyChart, we will contact you for abnormal readings. ? ?Your physician recommends that you schedule a follow-up appointment in: 6 months (October 2023)  **please call the office in August to arrange your follow up ** ? ?If you have any questions or concerns before your next appointment please send [place] a message through [place] or call our office at [phone].   ? ?TO LEAVE A MESSAGE FOR THE NURSE SELECT OPTION 2, PLEASE LEAVE A MESSAGE INCLUDING: ?YOUR NAME ?DATE OF BIRTH ?CALL BACK NUMBER ?REASON FOR CALL**this is important as we prioritize the call backs ? ?YOU WILL RECEIVE A CALL BACK THE SAME DAY AS LONG AS YOU CALL BEFORE 4:00 PM ? ?At the [facility], you and your health needs are our priority. As part of our continuing mission to provide you with exceptional heart care, we have created designated Provider Care Teams. These Care Teams include your primary Cardiologist (physician) and Advanced Practice Providers (APPs- Physician Assistants and Nurse Practitioners) who all work together to provide you with the care you need, when you need it.  ? ?You may see any of the following providers on your designated Care Team at your next follow up: ?Dr Glori Bickers ?Dr Loralie Champagne ?Darrick Grinder, NP ?Lyda Jester, PA ?Jessica Milford,NP ?Marlyce Huge, PA ?Audry Riles, PharmD ? ? ?Please be sure to bring in all your medications bottles to every appointment.  ? ? ?

## 2021-06-10 DIAGNOSIS — C61 Malignant neoplasm of prostate: Secondary | ICD-10-CM | POA: Diagnosis not present

## 2021-06-17 DIAGNOSIS — R351 Nocturia: Secondary | ICD-10-CM | POA: Diagnosis not present

## 2021-06-17 DIAGNOSIS — N401 Enlarged prostate with lower urinary tract symptoms: Secondary | ICD-10-CM | POA: Diagnosis not present

## 2021-06-17 DIAGNOSIS — C61 Malignant neoplasm of prostate: Secondary | ICD-10-CM | POA: Diagnosis not present

## 2021-06-17 DIAGNOSIS — C7951 Secondary malignant neoplasm of bone: Secondary | ICD-10-CM | POA: Diagnosis not present

## 2021-07-21 ENCOUNTER — Other Ambulatory Visit: Payer: Self-pay | Admitting: Family Medicine

## 2021-07-22 DIAGNOSIS — C61 Malignant neoplasm of prostate: Secondary | ICD-10-CM | POA: Diagnosis not present

## 2021-07-22 DIAGNOSIS — M818 Other osteoporosis without current pathological fracture: Secondary | ICD-10-CM | POA: Diagnosis not present

## 2021-07-24 ENCOUNTER — Other Ambulatory Visit (HOSPITAL_COMMUNITY): Payer: Self-pay | Admitting: Cardiology

## 2021-08-03 DIAGNOSIS — C44329 Squamous cell carcinoma of skin of other parts of face: Secondary | ICD-10-CM | POA: Diagnosis not present

## 2021-08-03 DIAGNOSIS — L570 Actinic keratosis: Secondary | ICD-10-CM | POA: Diagnosis not present

## 2021-08-03 DIAGNOSIS — Z85828 Personal history of other malignant neoplasm of skin: Secondary | ICD-10-CM | POA: Diagnosis not present

## 2021-08-03 DIAGNOSIS — D692 Other nonthrombocytopenic purpura: Secondary | ICD-10-CM | POA: Diagnosis not present

## 2021-08-03 DIAGNOSIS — L821 Other seborrheic keratosis: Secondary | ICD-10-CM | POA: Diagnosis not present

## 2021-08-26 ENCOUNTER — Other Ambulatory Visit: Payer: Self-pay

## 2021-08-26 ENCOUNTER — Inpatient Hospital Stay (HOSPITAL_COMMUNITY)
Admission: EM | Admit: 2021-08-26 | Discharge: 2021-08-28 | DRG: 813 | Disposition: A | Discharge status: Home Health | Payer: PPO | Attending: Family Medicine | Admitting: Family Medicine

## 2021-08-26 ENCOUNTER — Encounter (HOSPITAL_COMMUNITY): Payer: Self-pay | Admitting: Emergency Medicine

## 2021-08-26 DIAGNOSIS — T45515A Adverse effect of anticoagulants, initial encounter: Secondary | ICD-10-CM | POA: Diagnosis not present

## 2021-08-26 DIAGNOSIS — I951 Orthostatic hypotension: Secondary | ICD-10-CM | POA: Diagnosis not present

## 2021-08-26 DIAGNOSIS — Z881 Allergy status to other antibiotic agents status: Secondary | ICD-10-CM | POA: Diagnosis not present

## 2021-08-26 DIAGNOSIS — I5032 Chronic diastolic (congestive) heart failure: Secondary | ICD-10-CM | POA: Diagnosis not present

## 2021-08-26 DIAGNOSIS — I10 Essential (primary) hypertension: Secondary | ICD-10-CM | POA: Diagnosis not present

## 2021-08-26 DIAGNOSIS — Z825 Family history of asthma and other chronic lower respiratory diseases: Secondary | ICD-10-CM | POA: Diagnosis not present

## 2021-08-26 DIAGNOSIS — Z7901 Long term (current) use of anticoagulants: Secondary | ICD-10-CM

## 2021-08-26 DIAGNOSIS — R58 Hemorrhage, not elsewhere classified: Secondary | ICD-10-CM | POA: Diagnosis not present

## 2021-08-26 DIAGNOSIS — D6832 Hemorrhagic disorder due to extrinsic circulating anticoagulants: Secondary | ICD-10-CM | POA: Diagnosis not present

## 2021-08-26 DIAGNOSIS — I130 Hypertensive heart and chronic kidney disease with heart failure and stage 1 through stage 4 chronic kidney disease, or unspecified chronic kidney disease: Secondary | ICD-10-CM | POA: Diagnosis not present

## 2021-08-26 DIAGNOSIS — E785 Hyperlipidemia, unspecified: Secondary | ICD-10-CM | POA: Diagnosis present

## 2021-08-26 DIAGNOSIS — Z8546 Personal history of malignant neoplasm of prostate: Secondary | ICD-10-CM

## 2021-08-26 DIAGNOSIS — D62 Acute posthemorrhagic anemia: Secondary | ICD-10-CM | POA: Diagnosis not present

## 2021-08-26 DIAGNOSIS — R55 Syncope and collapse: Secondary | ICD-10-CM | POA: Diagnosis not present

## 2021-08-26 DIAGNOSIS — I252 Old myocardial infarction: Secondary | ICD-10-CM

## 2021-08-26 DIAGNOSIS — I480 Paroxysmal atrial fibrillation: Secondary | ICD-10-CM | POA: Diagnosis not present

## 2021-08-26 DIAGNOSIS — N1831 Chronic kidney disease, stage 3a: Secondary | ICD-10-CM | POA: Diagnosis present

## 2021-08-26 DIAGNOSIS — Z886 Allergy status to analgesic agent status: Secondary | ICD-10-CM | POA: Diagnosis not present

## 2021-08-26 DIAGNOSIS — I2510 Atherosclerotic heart disease of native coronary artery without angina pectoris: Secondary | ICD-10-CM | POA: Diagnosis present

## 2021-08-26 DIAGNOSIS — Z96653 Presence of artificial knee joint, bilateral: Secondary | ICD-10-CM | POA: Diagnosis not present

## 2021-08-26 DIAGNOSIS — Z955 Presence of coronary angioplasty implant and graft: Secondary | ICD-10-CM

## 2021-08-26 DIAGNOSIS — E860 Dehydration: Secondary | ICD-10-CM | POA: Diagnosis not present

## 2021-08-26 DIAGNOSIS — Z8673 Personal history of transient ischemic attack (TIA), and cerebral infarction without residual deficits: Secondary | ICD-10-CM

## 2021-08-26 DIAGNOSIS — I4819 Other persistent atrial fibrillation: Secondary | ICD-10-CM | POA: Diagnosis not present

## 2021-08-26 DIAGNOSIS — F419 Anxiety disorder, unspecified: Secondary | ICD-10-CM | POA: Diagnosis present

## 2021-08-26 DIAGNOSIS — S8011XA Contusion of right lower leg, initial encounter: Secondary | ICD-10-CM | POA: Diagnosis present

## 2021-08-26 DIAGNOSIS — J45909 Unspecified asthma, uncomplicated: Secondary | ICD-10-CM | POA: Diagnosis present

## 2021-08-26 DIAGNOSIS — Z20822 Contact with and (suspected) exposure to covid-19: Secondary | ICD-10-CM | POA: Diagnosis present

## 2021-08-26 DIAGNOSIS — Z79899 Other long term (current) drug therapy: Secondary | ICD-10-CM

## 2021-08-26 DIAGNOSIS — I4892 Unspecified atrial flutter: Secondary | ICD-10-CM | POA: Diagnosis present

## 2021-08-26 DIAGNOSIS — Z8249 Family history of ischemic heart disease and other diseases of the circulatory system: Secondary | ICD-10-CM

## 2021-08-26 DIAGNOSIS — F32A Depression, unspecified: Secondary | ICD-10-CM | POA: Diagnosis present

## 2021-08-26 DIAGNOSIS — T148XXA Other injury of unspecified body region, initial encounter: Principal | ICD-10-CM

## 2021-08-26 HISTORY — DX: Nontraumatic intracerebral hemorrhage, unspecified: I61.9

## 2021-08-26 HISTORY — DX: Other cerebral infarction: I63.89

## 2021-08-26 MED ORDER — HYDROMORPHONE HCL 1 MG/ML IJ SOLN
0.2500 mg | Freq: Once | INTRAMUSCULAR | Status: AC
  Administered 2021-08-26: 0.25 mg via INTRAVENOUS
  Filled 2021-08-26: qty 1

## 2021-08-26 NOTE — ED Triage Notes (Addendum)
Pt here from home via [facility] for near syncopal episode. Pt reports standing up and feeling dizzy, but did not fall.  Pt also c/o of bleeding hematoma to R leg. 141/62, 67HR, 16RR, 98% RA, CBG 136

## 2021-08-26 NOTE — ED Provider Notes (Signed)
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  HPI  Past medical history including CAD, atrial fib on Eliquis, CHF, hypertension, presents with complaint of syncope as well as right leg pain.  Patient states that he has a history of having orthostatic visual syncope, he states today while he was on the commode he went to stood up got slightly dizzy and had to sit back down.  He denies actually falling or hitting his head, there is no loss of conscious, he states did not have any chest pain or shortness of breath prior to the incident, he states that after sitting there for a little bit he got up and felt better.  He states this is normal for him.  Patient states he is really here because he is more concerned about his right calf, he states that he woke up this morning and noticed a large hematoma on his right calf and believes that he had a spontaneous hematoma.  He states he kept it elevated wrapped in place ice starting, and has decreased in size.  He denies any paresthesias or weakness moving down into his toes, states he still able to ambulate but it hurts when he does so, denies any trauma injury to the area.  States that he has had this in the past.  He states that he is withhold his Eliquis, his last dose was yesterday.  Home Medications Prior to Admission medications   Medication Sig Start Date End Date Taking? Authorizing Provider  albuterol (PROVENTIL) (2.5 MG/3ML) 0.083% nebulizer solution Inhale 3 mLs (2.5 mg total) into the lungs every 4 (four) hours as needed for wheezing or shortness of breath. 03/07/21   Geradine Girt, DO  albuterol (VENTOLIN HFA) 108 (90 Base) MCG/ACT inhaler Inhale 2 puffs into the lungs every 4 (four) hours as needed for wheezing or shortness of breath. 08/29/19   Brand Males, MD  atorvastatin (LIPITOR) 20 MG tablet Take 1 tablet by mouth once daily 12/22/20   Burchette, Alinda Sierras, MD  buPROPion (WELLBUTRIN SR) 150 MG 12 hr tablet TAKE 1 TABLET(150 MG) BY MOUTH DAILY 01/14/21   Burchette, Alinda Sierras, MD  cetirizine (ZYRTEC) 10 MG tablet Take 10 mg by mouth daily with supper.     [provider]  Cholecalciferol (VITAMIN D) 2000 units tablet Take 2,000 Units by mouth daily.    [provider]  dofetilide (TIKOSYN) 250 MCG capsule TAKE 1 CAPSULE(250 MCG) BY MOUTH TWICE DAILY 07/27/21   Larey Dresser, MD  ELIQUIS 5 MG TABS tablet TAKE 1 TABLET(5 MG) BY MOUTH TWICE DAILY 05/11/21   Larey Dresser, MD  FLUoxetine (PROZAC) 10 MG capsule Take 1 capsule (10 mg total) by mouth daily. 01/14/21   Burchette, Alinda Sierras, MD  fluticasone (FLONASE) 50 MCG/ACT nasal spray SHAKE LIQUID AND USE 2 SPRAYS IN EACH NOSTRIL DAILY 09/19/19   Brand Males, MD  Fluticasone Furoate (ARNUITY ELLIPTA) 200 MCG/ACT AEPB Inhale 1 puff into the lungs daily. 03/12/21   Brand Males, MD  LORazepam (ATIVAN) 1 MG tablet TAKE 1/2 TO 1 TABLET BY MOUTH AT BEDTIME AS NEEDED 07/22/21   Burchette, Alinda Sierras, MD  nitroGLYCERIN (NITROSTAT) 0.4 MG SL tablet Place 1 tablet (0.4 mg total) under the tongue every 5 (five) minutes  x 3 doses as needed for chest pain. 01/04/17   [name], MD  omeprazole (PRILOSEC OTC) 20 MG tablet Take 1 tablet (20 mg total) by mouth daily. 09/17/13   [name], MD  tamsulosin (FLOMAX) 0.4 MG CAPS capsule TAKE 1 CAPSULE(0.4 MG) BY MOUTH DAILY 12/22/20   Burchette, Alinda Sierras, MD      Allergies    Tylenol [acetaminophen], Azithromycin, Advil [ibuprofen], and Asa [aspirin]    Review of Systems   Review of Systems  Constitutional:  Negative for chills and fever.  Respiratory:  Negative for shortness of breath.   Cardiovascular:  Negative for chest pain.  Gastrointestinal:  Negative for abdominal pain.  Musculoskeletal:        Right calf hematoma   Neurological:  Positive for syncope. Negative for headaches.    Physical Exam Updated Vital Signs BP (!) 108/51   Pulse 69   Temp 97.8 F (36.6 C) (Oral)   Resp 17   SpO2 97%  Physical Exam Vitals and nursing note reviewed.  Constitutional:      General: He is not in acute distress.    Appearance: He is not ill-appearing.  HENT:     Head: Normocephalic and atraumatic.     Comments: No deformity of the head present no raccoon eyes or battle sign noted.    Nose: No congestion.     Mouth/Throat:     Mouth: Mucous membranes are moist.     Pharynx: Oropharynx is clear.     Comments: No trismus no torticollis Eyes:     Extraocular Movements: Extraocular movements intact.     Conjunctiva/sclera: Conjunctivae normal.  Cardiovascular:     Rate and Rhythm: Normal rate and regular rhythm.     Pulses: Normal pulses.     Heart sounds: No murmur heard.    No friction rub. No gallop.  Pulmonary:     Effort: No respiratory distress.     Breath sounds: No wheezing, rhonchi or rales.  Musculoskeletal:     Comments: Patient has a large hematoma on the posterior aspect of his calf spanning about two thirds of the upper aspect of it, soft, cool to touch, no pulsating mass, he has full range of motion in his toes ankle and knee bilaterally, 2+ dorsal pedal pulses, compartments are all soft and nontender.  Please see picture for full detail  Skin:    General: Skin is warm and dry.  Neurological:     Mental Status: He is alert.     Comments: No facial asymmetry no difficulty with word finding following two-step commands no unilateral weakness present.  Psychiatric:        Mood and Affect: Mood normal.      ED Results / Procedures / Treatments   Labs (all labs ordered are listed, but only abnormal results are displayed) Labs Reviewed  CBC WITH DIFFERENTIAL/PLATELET - Abnormal; Notable for the following components:      Result Value   WBC 11.4 (*)    RBC 3.07 (*)    Hemoglobin 9.3 (*)     HCT 29.0 (*)    Neutro Abs 9.2 (*)    Monocytes Absolute 1.2 (*)    Abs Immature Granulocytes 0.16 (*)    All other components within normal limits  CK - Abnormal; Notable for the following components:   Total CK 35 (*)    All other components within normal limits  COMPREHENSIVE METABOLIC PANEL - Abnormal; Notable for the following components:  Glucose, Bld 115 (*)    Calcium 8.4 (*)    Total Protein 5.8 (*)    Albumin 3.2 (*)    AST 14 (*)    GFR, Estimated 58 (*)    All other components within normal limits  MAGNESIUM    EKG EKG Interpretation  Date/Time:  Wednesday August 26 2021 21:50:13 EDT Ventricular Rate:  80 PR Interval:  154 QRS Duration: 84 QT Interval:  444 QTC Calculation: 512 R Axis:   53 Text Interpretation: Sinus rhythm with Premature atrial complexes in a pattern of bigeminy Prolonged QT Abnormal ECG Confirmed by Godfrey Pick ([id number]) on 08/26/2021 10:02:45 PM  Radiology No results found.  Procedures Procedures    Medications Ordered in ED Medications  fentaNYL (SUBLIMAZE) 50 MCG/ML injection (  Not Given 08/27/21 0101)  HYDROmorphone (DILAUDID) injection 0.25 mg (0.25 mg Intravenous Given 08/26/21 2325)  fentaNYL (SUBLIMAZE) injection 50 mcg (50 mcg Intravenous Given 08/27/21 0100)  sodium chloride 0.9 % bolus 500 mL (0 mLs Intravenous Stopped 08/27/21 0144)    ED Course/ Medical Decision Making/ A&P                           Medical Decision Making Amount and/or Complexity of Data Reviewed Labs: ordered.  Risk Prescription drug management.   This patient presents to the ED for concern of syncope and a hematoma, this involves an extensive number of treatment options, and is a complaint that carries with it a high risk of complications and morbidity.  The differential diagnosis includes ACS, arrhythmia, CVA, compartment syndrome,    Additional history obtained:  Additional history obtained from N/A External records from outside source  obtained and reviewed including previous hospital paperwork   Co morbidities that complicate the patient evaluation  CHF, A-fib, anticoagulated  Social Determinants of Health:  Geriatric    Lab Tests:  I Ordered, and personally interpreted labs.  The pertinent results include: CBC shows leukocytosis 11.4, [place] anemia hemoglobin 9.3 baseline appears to be around 10-11, CMP shows glucose of 115 calcium 8.4 albumin 3.2 AST 14 GFR 58 CK 35 mag 1.9   Imaging Studies ordered:  I ordered imaging studies including N/A I independently visualized and interpreted imaging which showed N/A I agree with the radiologist interpretation   Cardiac Monitoring:  The patient was maintained on a cardiac monitor.  I personally viewed and interpreted the cardiac monitored which showed an underlying rhythm of: EKG without signs of ischemia   Medicines ordered and prescription drug management:  I ordered medication including fluids, pain medication I have reviewed the patients home medicines and have made adjustments as needed  Critical Interventions:  N/A   Reevaluation:  Presents after syncope as well as right calf pain, he had above a benign physical exam, will obtain basic lab work-up and reassess.   Lab work shows that he has a drop in hemoglobin by 3 g, attempted orthostatics patient's BP dropped from systolic pressure of 789 down to 78 from laying to sitting, did not attempt to stand. Patient did not endorse any lightheaded or dizziness.  Due to the sudden drop in his hemoglobin as well as positive orthostatics will recommend for observation patient agreed this plan will consult hospitalist team for admission.     Consultations Obtained:  I requested consultation with the hospitalist Dr. Bridgett Larsson,  and discussed lab and imaging findings as well as pertinent plan - they recommend: He will admit the patient.  Test Considered:  Defer on CT head-suspicion for intracranial head bleed  is very low at this time no head trauma, no deficits present my exam presentation is atypical.    Rule out low suspicion for CVA or intracranial head bleed as patient denies change in vision, paresthesias or weakness to upper lower extremities, no neuro deficits noted on exam.  Low suspicion for ACS or arrhythmias as patient denies chest pain, shortness of breath, no hypoperfusion or fluid overload on exam, EKG sinus without signs of ischemia.  I have low suspicion for ruptured Achilles tendon as he has full range of motion his ankle particularly dorsal flexion, case tendon was fully intact during my exam.  I have low suspicion for compartment syndrome as all compartments are soft nontender, he had good dorsal pedal pulses neurovascular fully intact.  I have low suspicion patient will require blood transfusion at this time as he is hemodynamically stable, he is orthostatic positive, I suspect this is from dehydration as he does appear slightly dry on my exam is not a drink all day.  Low suspicion for systemic infection as patient is nontoxic-appearing, vital signs reassuring, no obvious source infection noted on exam.       Dispostion and problem list  After consideration of the diagnostic results and the patients response to treatment, I feel that the patent would benefit from admission.  Right leg hematoma-patient last took Eliquis on Tuesday night, will hold Eliquis at this time to prevent further bleeding of the right hematoma.  Hypertension orthostatics-likely multifactorial dehydration is most possible blood loss, patient was provided 500 cc of fluids, patient will need further observation.             Final Clinical Impression(s) / ED Diagnoses Final diagnoses:  Hematoma  Dehydration    Rx / DC Orders ED Discharge Orders     None         Marcello Fennel, PA-C 08/27/21 0158    Godfrey Pick, MD 08/29/21 [phone]

## 2021-08-27 ENCOUNTER — Encounter (HOSPITAL_COMMUNITY): Payer: Self-pay | Admitting: Internal Medicine

## 2021-08-27 DIAGNOSIS — I951 Orthostatic hypotension: Secondary | ICD-10-CM

## 2021-08-27 DIAGNOSIS — E785 Hyperlipidemia, unspecified: Secondary | ICD-10-CM | POA: Diagnosis not present

## 2021-08-27 DIAGNOSIS — R58 Hemorrhage, not elsewhere classified: Secondary | ICD-10-CM

## 2021-08-27 DIAGNOSIS — I4819 Other persistent atrial fibrillation: Secondary | ICD-10-CM

## 2021-08-27 DIAGNOSIS — D62 Acute posthemorrhagic anemia: Secondary | ICD-10-CM

## 2021-08-27 DIAGNOSIS — N1831 Chronic kidney disease, stage 3a: Secondary | ICD-10-CM

## 2021-08-27 DIAGNOSIS — I480 Paroxysmal atrial fibrillation: Secondary | ICD-10-CM

## 2021-08-27 DIAGNOSIS — I10 Essential (primary) hypertension: Secondary | ICD-10-CM | POA: Diagnosis not present

## 2021-08-27 HISTORY — DX: Acute posthemorrhagic anemia: D62

## 2021-08-27 LAB — COMPREHENSIVE METABOLIC PANEL
ALT: 14 U/L (ref 0–44)
AST: 14 U/L — ABNORMAL LOW (ref 15–41)
Albumin: 3.2 g/dL — ABNORMAL LOW (ref 3.5–5.0)
Alkaline Phosphatase: 47 U/L (ref 38–126)
Anion gap: 6 (ref 5–15)
BUN: 23 mg/dL (ref 8–23)
CO2: 24 mmol/L (ref 22–32)
Calcium: 8.4 mg/dL — ABNORMAL LOW (ref 8.9–10.3)
Chloride: 106 mmol/L (ref 98–111)
Creatinine, Ser: 1.19 mg/dL (ref 0.61–1.24)
GFR, Estimated: 58 mL/min — ABNORMAL LOW (ref >60)
Glucose, Bld: 115 mg/dL — ABNORMAL HIGH (ref 70–99)
Potassium: 4.3 mmol/L (ref 3.5–5.1)
Sodium: 136 mmol/L (ref 135–145)
Total Bilirubin: 0.6 mg/dL (ref 0.3–1.2)
Total Protein: 5.8 g/dL — ABNORMAL LOW (ref 6.5–8.1)

## 2021-08-27 LAB — CBC WITH DIFFERENTIAL/PLATELET
Abs Immature Granulocytes: 0.16 10*3/uL — ABNORMAL HIGH (ref 0.00–0.07)
Basophils Absolute: 0 10*3/uL (ref 0.0–0.1)
Basophils Relative: 0 %
Eosinophils Absolute: 0.2 10*3/uL (ref 0.0–0.5)
Eosinophils Relative: 2 %
HCT: 29 % — ABNORMAL LOW (ref 39.0–52.0)
Hemoglobin: 9.3 g/dL — ABNORMAL LOW (ref 13.0–17.0)
Immature Granulocytes: 1 %
Lymphocytes Relative: 6 %
Lymphs Abs: 0.7 10*3/uL (ref 0.7–4.0)
MCH: 30.3 pg (ref 26.0–34.0)
MCHC: 32.1 g/dL (ref 30.0–36.0)
MCV: 94.5 fL (ref 80.0–100.0)
Monocytes Absolute: 1.2 10*3/uL — ABNORMAL HIGH (ref 0.1–1.0)
Monocytes Relative: 10 %
Neutro Abs: 9.2 10*3/uL — ABNORMAL HIGH (ref 1.7–7.7)
Neutrophils Relative %: 81 %
Platelets: 204 10*3/uL (ref 150–400)
RBC: 3.07 MIL/uL — ABNORMAL LOW (ref 4.22–5.81)
RDW: 14.2 % (ref 11.5–15.5)
WBC: 11.4 10*3/uL — ABNORMAL HIGH (ref 4.0–10.5)
nRBC: 0 % (ref 0.0–0.2)

## 2021-08-27 LAB — CK: Total CK: 35 U/L — ABNORMAL LOW (ref 49–397)

## 2021-08-27 LAB — HEMOGLOBIN AND HEMATOCRIT, BLOOD
HCT: 27 % — ABNORMAL LOW (ref 39.0–52.0)
HCT: 25.1 % — ABNORMAL LOW (ref 39.0–52.0)
HCT: 25 % — ABNORMAL LOW (ref 39.0–52.0)
Hemoglobin: 8.5 g/dL — ABNORMAL LOW (ref 13.0–17.0)
Hemoglobin: 7.9 g/dL — ABNORMAL LOW (ref 13.0–17.0)
Hemoglobin: 8.2 g/dL — ABNORMAL LOW (ref 13.0–17.0)

## 2021-08-27 LAB — MAGNESIUM: Magnesium: 1.9 mg/dL (ref 1.7–2.4)

## 2021-08-27 MED ORDER — OXYCODONE HCL 5 MG PO TABS
5.0000 mg | ORAL_TABLET | ORAL | Status: DC | PRN
  Administered 2021-08-27: 5 mg via ORAL
  Filled 2021-08-27: qty 1

## 2021-08-27 MED ORDER — BUPROPION HCL ER (SR) 150 MG PO TB12
150.0000 mg | ORAL_TABLET | Freq: Every day | ORAL | Status: DC
  Administered 2021-08-27 – 2021-08-28 (×2): 150 mg via ORAL
  Filled 2021-08-27 (×2): qty 1

## 2021-08-27 MED ORDER — ATORVASTATIN CALCIUM 10 MG PO TABS
20.0000 mg | ORAL_TABLET | Freq: Every day | ORAL | Status: DC
  Administered 2021-08-27 – 2021-08-28 (×2): 20 mg via ORAL
  Filled 2021-08-27 (×2): qty 2

## 2021-08-27 MED ORDER — SODIUM CHLORIDE 0.9 % IV SOLN: INTRAVENOUS | Status: AC

## 2021-08-27 MED ORDER — FENTANYL CITRATE PF 50 MCG/ML IJ SOSY
PREFILLED_SYRINGE | INTRAMUSCULAR | Status: AC
  Filled 2021-08-27: qty 1

## 2021-08-27 MED ORDER — DOFETILIDE 250 MCG PO CAPS
250.0000 ug | ORAL_CAPSULE | Freq: Two times a day (BID) | ORAL | Status: DC
  Administered 2021-08-27 – 2021-08-28 (×2): 250 ug via ORAL
  Filled 2021-08-27 (×4): qty 1

## 2021-08-27 MED ORDER — TAMSULOSIN HCL 0.4 MG PO CAPS
0.4000 mg | ORAL_CAPSULE | Freq: Every day | ORAL | Status: DC
Start: 2021-08-27 — End: 2021-08-28
  Administered 2021-08-27 – 2021-08-28 (×2): 0.4 mg via ORAL
  Filled 2021-08-27 (×2): qty 1

## 2021-08-27 MED ORDER — SODIUM CHLORIDE 0.9 % IV BOLUS
500.0000 mL | Freq: Once | INTRAVENOUS | Status: AC
  Administered 2021-08-27: 500 mL via INTRAVENOUS

## 2021-08-27 MED ORDER — FLUOXETINE HCL 10 MG PO CAPS
10.0000 mg | ORAL_CAPSULE | Freq: Every day | ORAL | Status: DC
  Administered 2021-08-27 – 2021-08-28 (×2): 10 mg via ORAL
  Filled 2021-08-27 (×3): qty 1

## 2021-08-27 MED ORDER — FENTANYL CITRATE PF 50 MCG/ML IJ SOSY
50.0000 ug | PREFILLED_SYRINGE | Freq: Once | INTRAMUSCULAR | Status: AC
  Administered 2021-08-27: 50 ug via INTRAVENOUS

## 2021-08-27 NOTE — Subjective & Objective (Signed)
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  Patient has a history of paroxysmal atrial fibrillation currently on Eliquis and Tikosyn.  He is followed by Dr. Algernon Huxley with cardiology.  Patient also history of hypertension, CKD stage IIIa, hyperlipidemia, prior history of stroke.  Patient states he noticed initially some mild but then moderate pain in his right calf.  He noticed bruising and then more swelling of his right calf.  Presented to the ER today.  Noted to have a large right calf hematoma.  Patient's last dose of Eliquis was on the evening of August 25, 2021.  He is ready been in communication with his cardiologist about his hematoma.  In the ER, he had noted to have a 3 g drop in his hemoglobin along with being orthostatic on vital signs.  [facility] hospitalist contacted for observation admission.

## 2021-08-27 NOTE — Assessment & Plan Note (Signed)
Stable.  Patient slightly orthostatic given his recent blood loss.

## 2021-08-27 NOTE — Consult Note (Addendum)
Cardiology Consultation:   Patient ID: [name] Spence, MD MRN: [id number]; DOB: 05-25-1929  Admit date: 08/26/2021 Date of Consult: 08/27/2021  PCP:  [name], MD   [place] Providers Cardiologist:  Dr. Aundra Dubin EP: Dr. 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 Teryl Lucy.    AAD Hx Multaq remotely > stopped 2/2 recurrent AFib Tikosyn 2017 >> current   Stroke on warfarin >> Eliquis  History of Present Illness:   [name] was last seen by EP service by myself Oct 2022, having AF burden was up some, still exercising and golfing. He wishes he could golf more often, perhaps limited by the AFib He and his wife walk most nights, on the way back is inclined and the hills get home winded though once home settles quickly  He has no CP with his exercising, walking, golfing, though when in Afib has some heaviness. NO near syncope or syncope. He does feel orthostatic dizziness when standing quickly. We discussed amiodarone as the next AAD option, he would like to avoid amiodarone We discussed pending his labs today, Creat that if his Clearance remains down some, and need to reduce his Tikosyn, may want to revisit making the switch to amio. He felt best staying the course with Tikosyn, Creat was 1.3 with reduced clearance, this was dscussed with Dr. Lovena Le, given stable QTc, advised to stay on Tikosyn 255mg with no changes.  Follows most regularly with Dr. MAundra Dubin Hospitalized Jan 2023 with pneumonia/flu ? COVID, HF team saw him, struggled with AFib/atypical flutter this admission, looks like he had some prolongation of QT though had been given azithromycin and improved off. Off nodal  blocking agents with low BPs and orthostatic hypotension Known severe TR, not a surgical candidate, not interested in percutaneous intervention  AT his April visit with Dr. MAundra Dubin no symptoms of Afib since the hospitalization, QTc was stable  He is admitted now with painful R calf w/swelling and bruising.  Denied any known trauma.  He reported + orthostatic dizziness, but denied any falls or syncope, no reports of trauma of any kind. Self held his eliquis at home yesterday.  EP is asked to  the case regarding his Tikosyn and concerns of prolonged QT  LABS K+ 4.3 Mag 1.9 BUN/Creat 23/1.19 (Calc CrCl is 41) WBC 11.4 H/H 9.3/29 and 8.5/27 (from 11) Plts 204  Cardiac/rhythm-wise he has felt well, mentions he had been quite fortunate in that regard. He does not think he has had Af since January. 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 He noted the  bruising/swelling Tuesday, held his Eliquis yesterday though continued to swell with escalating pain and came in   Past Medical History:  Diagnosis Date   Acute hemorrhagic infarction of brain ([facility]    Allergic rhinitis    Anxiety    Asthma    Atrial fibrillation ([facility]    CAD (coronary artery disease)    CHF (congestive heart failure) ([facility]    Depression    Diverticulosis 03/16/1995   DJD (degenerative joint disease)    "  most joints" (03/11/2015)   GERD (gastroesophageal reflux disease) 12/30/2000   Heart attack ([place]) 1984   Hiatal hernia 12/30/2000   History of blood transfusion 1988   "related to GI bleeding OR"   History of duodenal ulcer 10/15/1986   Hyperlipidemia    Hypertension    ICH (intracerebral hemorrhage) ([place])    Sepsis ([place]) 12/2018   Sinus bradycardia    Stroke [facility])     Past Surgical History:  Procedure Laterality Date   CARDIAC CATHETERIZATION     CATARACT EXTRACTION W/ INTRAOCULAR LENS  IMPLANT,  BILATERAL Bilateral ?2013   [place] WITH STENT PLACEMENT  2010   hospital ccu  1984   heart attack, hypoplastic right coronary   INGUINAL HERNIA REPAIR Right ~ [place] Bilateral 06/10/2017   Procedure: BILATERAL ORCHIECTOMY;  Surgeon: Franchot Gallo, MD;  Location: [facility] ORS;  Service: Urology;  Laterality: Bilateral;  MAC ANESTHESIA AND LOCAL   TONSILLECTOMY     TOTAL KNEE ARTHROPLASTY Bilateral 1998-2004   "right-left"   VAGOTOMY AND PYLOROPLASTY  1988   "bleeding duodenal ulcer"     Home Medications:  Prior to Admission medications   Medication Sig Start Date End Date Taking? Authorizing Provider  albuterol (PROVENTIL) (2.5 MG/3ML) 0.083% nebulizer solution Inhale 3 mLs (2.5 mg total) into the lungs every 4 (four) hours as needed for wheezing or shortness of breath. 03/07/21   Geradine Girt, DO  albuterol (VENTOLIN HFA) 108 (90 Base) MCG/ACT inhaler Inhale 2 puffs into the lungs every 4 (four) hours as needed for wheezing or shortness of breath. 08/29/19   Brand Males, MD  atorvastatin (LIPITOR) 20 MG tablet Take 1 tablet by mouth once daily 12/22/20   Burchette, Alinda Sierras, MD  buPROPion (WELLBUTRIN SR) 150 MG 12 hr tablet TAKE 1 TABLET(150 MG) BY MOUTH DAILY 01/14/21   Burchette, Alinda Sierras, MD  cetirizine (ZYRTEC) 10 MG tablet Take 10 mg by mouth daily with supper.     [provider]  Cholecalciferol (VITAMIN D) 2000 units tablet Take 2,000 Units by mouth daily.    [provider]  dofetilide (TIKOSYN) 250 MCG capsule TAKE 1 CAPSULE(250 MCG) BY MOUTH TWICE DAILY 07/27/21   Larey Dresser, MD  ELIQUIS 5 MG TABS tablet TAKE 1 TABLET(5 MG) BY MOUTH TWICE DAILY 05/11/21   Larey Dresser, MD  FLUoxetine (PROZAC) 10 MG capsule Take 1 capsule (10 mg total) by mouth daily. 01/14/21   Burchette, Alinda Sierras, MD  fluticasone (FLONASE) 50 MCG/ACT nasal spray SHAKE LIQUID AND USE 2 SPRAYS IN EACH  NOSTRIL DAILY 09/19/19   Brand Males, MD  Fluticasone Furoate (ARNUITY ELLIPTA) 200 MCG/ACT AEPB Inhale 1 puff into the lungs daily. 03/12/21   Brand Males, MD  LORazepam (ATIVAN) 1 MG tablet TAKE 1/2 TO 1 TABLET BY MOUTH AT BEDTIME AS NEEDED 07/22/21   Burchette, Alinda Sierras, MD  nitroGLYCERIN (NITROSTAT) 0.4 MG SL tablet Place 1 tablet (0.4 mg total) under the tongue every 5 (five) minutes x 3 doses as needed for chest pain. 01/04/17   [name], MD  omeprazole (PRILOSEC OTC) 20 MG tablet Take 1 tablet (20 mg total) by mouth daily. 09/17/13   [name], MD  tamsulosin (FLOMAX) 0.4 MG CAPS capsule TAKE 1 CAPSULE(0.4 MG) BY MOUTH DAILY 12/22/20   Burchette, Alinda Sierras, MD    Inpatient Medications: Scheduled Meds:  atorvastatin  20 mg Oral  Daily   dofetilide  250 mcg Oral BID   fentaNYL       tamsulosin  0.4 mg Oral Daily   Continuous Infusions:  sodium chloride 75 mL/hr at 08/27/21 0821   PRN Meds: fentaNYL, oxyCODONE  Allergies:    Allergies  Allergen Reactions   Tylenol [Acetaminophen] Rash   Azithromycin    Advil [Ibuprofen] Nausea Only and Rash   Asa [Aspirin] Rash    Social History:   Social History   Socioeconomic History   Marital status: Married    Spouse name: Not on file   Number of children: 2   Years of education: Not on file   Highest education level: Doctorate  Occupational History   Occupation: IT sales professional: RETIRED    Comment: radiologist  Tobacco Use   Smoking status: Never   Smokeless tobacco: Never  Vaping Use   Vaping Use: Never used  Substance and Sexual Activity   Alcohol use: No   Drug use: No   Sexual activity: Not Currently  Other Topics Concern   Not on file  Social History Narrative   Married. Regular exercise - yes.       03/26/2019: Married to Therapist, sports, lives in 3 story home, although lives on first level mostly. Has two grown children.      Enjoys golfing; drives to [place] with wife every winter for a few days,  watches sports, following Merchandiser, retail, reads about WWII,    Social Determinants of Health   Financial Resource Strain: Low Risk  (04/15/2021)   Overall Financial Resource Strain (CARDIA)    Difficulty of Paying Living Expenses: Not hard at all  Food Insecurity: No Food Insecurity (04/15/2021)   Hunger Vital Sign    Worried About Running Out of Food in the Last Year: Never true    [place] in the Last Year: Never true  Transportation Needs: No Transportation Needs (04/15/2021)   PRAPARE - Hydrologist (Medical): No    Lack of Transportation (Non-Medical): No  Physical Activity: Insufficiently Active (04/15/2021)   Exercise Vital Sign    Days of Exercise per Week: 2 days    Minutes of Exercise per Session: 40 min  Stress: No Stress Concern Present (04/15/2021)   [place]    Feeling of Stress : Not at all  Social Connections: [place] (04/15/2021)   Social Connection and Isolation Panel [NHANES]    Frequency of Communication with Friends and Family: More than three times a week    Frequency of Social Gatherings with Friends and Family: More than three times a week    Attends Religious Services: More than 4 times per year    Active Member of [employer] or Organizations: Yes    Attends Music therapist: More than 4 times per year    Marital Status: Married  Human resources officer Violence: Not At Risk (04/15/2021)   Humiliation, Afraid, Rape, and Kick questionnaire    Fear of Current or Ex-Partner: No    Emotionally Abused: No    Physically Abused: No    Sexually Abused: No    Family History:   Family History  Problem Relation Age of Onset   Coronary artery disease Mother        deceased   Deep vein thrombosis Sister    Coronary artery disease Brother    Asthma Brother    Colon cancer Neg Hx  ROS:  Please see the history of present illness.  All other ROS reviewed  and negative.     Physical Exam/Data:   Vitals:   08/27/21 0900 08/27/21 0915 08/27/21 0930 08/27/21 0937  BP: (!) 120/56 (!) 107/55 (!) 96/37   Pulse: 79 81 84   Resp: 12 (!) 21 20   Temp:    98 F (36.7 C)  TempSrc:    Oral  SpO2: 97% 99% 99%     Intake/Output Summary (Last 24 hours) at 08/27/2021 0958 Last data filed at 08/27/2021 0144 Gross per 24 hour  Intake 499 ml  Output --  Net 499 ml      05/27/2021    9:04 AM 04/15/2021    3:30 PM 03/12/2021    1:13 PM  Last 3 Weights  Weight (lbs) 154 lb 6.4 oz 154 lb 154 lb  Weight (kg) 70.035 kg 69.854 kg 69.854 kg     There is no height or weight on file to calculate BMI.  General:  Well nourished, well developed, in no acute distress, looks his age 74: normal Neck: no JVD Vascular: No carotid bruits; Distal pulses 2+ bilaterally Cardiac:  RRR; 2/6 DM Lungs: CTA b/l, no wheezing, rhonchi or rales  Abd: soft, nontender  Ext: no edema Musculoskeletal:  No deformities, advance/age appropriate atrophy R calf is swollen, marked ecchymosis that spreads proximally into his posterior thigh Skin: warm and dry  Neuro:  no focal abnormalities noted Psych:  Normal affect   EKG:  The EKG was personally reviewed and demonstrates:    SR 80bpm, machine read QTc 512, I manually measure QT 457m w/QTc of 462  SR 80, machine QTc 506, mine QT of 420-440, QTc 486-508  Telemetry:  Telemetry was personally reviewed and demonstrates:   SR 80s    Relevant CV Studies:  09/22/2017: TTE Study Conclusions  - Left ventricle: The cavity size was normal. Systolic function was    normal. The estimated ejection fraction was in the range of 60%    to 65%. Wall motion was normal; there were no regional wall    motion abnormalities. Doppler parameters are consistent with    abnormal left ventricular relaxation (grade 1 diastolic    dysfunction).  - Aortic valve: Trileaflet; moderately thickened, moderately    calcified leaflets. There was  trivial regurgitation.  - Mitral valve: Calcified annulus. Mildly thickened leaflets .    There was trivial regurgitation.  - Left atrium: The atrium was mildly dilated. Volume/bsa, S: 36.7    ml/m^2.  - Tricuspid valve: There was severe regurgitation.  - Pulmonary arteries: Systolic pressure was moderately increased.    PA peak pressure: 62 mm Hg (S).   Impressions:   - Compared to the prior study, there has been no significant    interval change.   Laboratory Data:  High Sensitivity Troponin:  No results for input(s): "TROPONINIHS" in the last 720 hours.   Chemistry Recent Labs  Lab 08/26/21 2320  NA 136  K 4.3  CL 106  CO2 24  GLUCOSE 115*  BUN 23  CREATININE 1.19  CALCIUM 8.4*  MG 1.9  GFRNONAA 58*  ANIONGAP 6    Recent Labs  Lab 08/26/21 2320  PROT 5.8*  ALBUMIN 3.2*  AST 14*  ALT 14  ALKPHOS 47  BILITOT 0.6   Lipids No results for input(s): "CHOL", "TRIG", "HDL", "LABVLDL", "LDLCALC", "CHOLHDL" in the last 168 hours.  Hematology Recent Labs  Lab 08/26/21 2320 08/27/21 02409  WBC 11.4*  --   RBC 3.07*  --   HGB 9.3* 8.5*  HCT 29.0* 27.0*  MCV 94.5  --   MCH 30.3  --   MCHC 32.1  --   RDW 14.2  --   PLT 204  --    Thyroid No results for input(s): "TSH", "FREET4" in the last 168 hours.  BNPNo results for input(s): "BNP", "PROBNP" in the last 168 hours.  DDimer No results for input(s): "DDIMER" in the last 168 hours.   Radiology/Studies:  No results found.   Assessment and Plan:   Paroxysmal Afib,atypical AFlutter has been mentioned CHA2DS2Vasc is 7, his eliquis held with acute calf hematoma Home tikosyn dose is 281mg (appropriate for his renal function) I think his QT is borderline Prozac has been added since we last saw him last year, but not brand new for him either  I will review his EKG with Dr. LQuentin Orewho will see him later today He did not get th AM dose I suspect we can continue unchanged, though would look to trying to minimize  other potential QT prolonging meds  He does think perhaps that the prozac has helped him, so far not ordered for here  ADDEND: EKGs are reviewed with Dr. [place]Quentin Ore [place] Tikosyn 2521m dosing this evening dose. Keep K+ 4.0 or better and Mag 2.0 or better, Recommend daily BMET and EKGs    Risk Assessment/Risk Scores:    For questions or updates, please contact [facility] consult [email or web address] for contact info under    Signed, ReBaldwin JamaicaPA-C  08/27/2021 9:58 AM

## 2021-08-27 NOTE — ED Notes (Signed)
Obtained orthostatic vital signs, pt was unable to tolerate standing for more than a few seconds. Pt felt very dizzy. (See charted orthostatic vitals) RN and NT helped pt back to sitting position. Pt's BP quickly dropped to 66/43. (Unsure how accurate this was). Pt then reclined to laying position. Pt alert and oriented, but still felt dizzy. BP improved to 87/47 after laying down for a minute. Pt states he feels better. Will continue to closely monitor.

## 2021-08-27 NOTE — Progress Notes (Addendum)
   Patient seen and examined at bedside, patient admitted after midnight, please see earlier detailed admission note by Kristopher Oppenheim, DO. Briefly, patient presented secondary to spontaneous hemorrhage into left lower extremity while on Eliquis. Eliquis held.  Subjective: Patient reports some mild improvement in leg size  BP (!) 109/54   Pulse 76   Temp 98 F (36.7 C) (Oral)   Resp 15   SpO2 99%   General exam: Appears calm and comfortable Respiratory system: Clear to auscultation. Respiratory effort normal. Cardiovascular system: S1 & S2 heard, RRR. Gastrointestinal system: Abdomen is nondistended, soft and nontender. Central nervous system: Alert and oriented. No focal neurological deficits. Musculoskeletal: Right lower leg with large hematoma in posterior aspect with associated overlying ecchymosis Psychiatry: Judgement and insight appear normal. Mood & affect appropriate.   Brief assessment/Plan:  Spontaneous hemorrhage In setting of Eliquis use for paroxysmal atrial fibrillation. Associated anemia and orthostatic hypotension (although this is a chronic issue, per patient). -Hold Eliquis -PT/OT evaluation  Orthostatic hypotension Chronic issue, per patient. Likely complicated by blood loss. -IV fluids -Recheck orthostatic vitals  Acute blood loss anemia Secondary to hematoma. -H&H q6 hours and transfuse for hemoglobin <7 g/dL or <8 g/dL with symptoms  CKD stage IIIa Stable.  Paroxysmal atrial fibrillation Patient is in sinus rhythm. He is managed on Tikosyn and is on Eliquis for stroke prophylaxis. QTc is elevated on EKG. Electrophysiology consulted with recommendations to continue Tikosyn at this time. -Continue Tikosyn -Hold Eliquis  Primary hypertension Noted, although patient is not on antihypertensives as an outpatient. History of orthostatic hypotension.  Hyperlipidemia -Continue Lipitor  Depression -Continue Wellbutrin and Prozac  Family communication:  None at bedside DVT prophylaxis: SCDs (left leg) Disposition: Discharge home likely in 24 hours if hemoglobin stabilizes  Cordelia Poche, MD [facility] 08/27/2021, 12:13 PM

## 2021-08-27 NOTE — Evaluation (Signed)
Physical Therapy Evaluation Patient Details Name: [name] KUNKLE, MD MRN: [id number] DOB: May 16, 1929 Today's Date: 08/27/2021  History of Present Illness  Pt is a 88 y/o male admitted secondary to increased swelling and bruising in R calf. Thought to be spontaneous hemorrhage. Pt also with orthostatic hypotension. PMH includes HTN, a fib, CKD, CVA, and CHF.  Clinical Impression  Pt admitted secondary to problem above with deficits below. Pt with increased swelling and bruising of R calf. Min guard for bed mobility. Upon sitting, pt with + orthostatics, so further mobility deferred. Anticipate pt will progress well as vitals improve. Will continue to follow acutely and update recommendations as necessary based on pt progression.    08/27/21 0956  Orthostatic Lying   BP- Lying 112/51  Pulse- Lying 79  Orthostatic Sitting  BP- Sitting (!) 83/48  Pulse- Sitting 81       Recommendations for follow up therapy are one component of a multi-disciplinary discharge planning process, led by the attending physician.  Recommendations may be updated based on patient status, additional functional criteria and insurance authorization.  Follow Up Recommendations Home health PT (Pending progression)      Assistance Recommended at Discharge Frequent or constant Supervision/Assistance  Patient can return home with the following  A little help with walking and/or transfers;A little help with bathing/dressing/bathroom;Assistance with cooking/housework;Help with stairs or ramp for entrance;Assist for transportation    Equipment Recommendations Other (comment) (TBD)  Recommendations for Other Services       Functional Status Assessment Patient has had a recent decline in their functional status and demonstrates the ability to make significant improvements in function in a reasonable and predictable amount of time.     Precautions / Restrictions Precautions Precautions: Fall Precaution Comments: watch  BP Restrictions Weight Bearing Restrictions: No      Mobility  Bed Mobility Overal bed mobility: Needs Assistance Bed Mobility: Supine to Sit, Sit to Supine     Supine to sit: Min guard Sit to supine: Min guard   General bed mobility comments: Min guard for safety for bed mobility on stretcher. Upon sitting, pt reporting some lightheadedness and BP dropped to 83/48 (58), so further mobility deferred.    Transfers                        Ambulation/Gait                  Stairs            Wheelchair Mobility    Modified Rankin (Stroke Patients Only)       Balance Overall balance assessment: Needs assistance Sitting-balance support: No upper extremity supported, Feet supported Sitting balance-Leahy Scale: Fair                                       Pertinent Vitals/Pain Pain Assessment Pain Assessment: Faces Faces Pain Scale: Hurts little more Pain Location: R calf Pain Descriptors / Indicators: Grimacing, Guarding Pain Intervention(s): Limited activity within patient's tolerance, Monitored during session, Repositioned    Home Living Family/patient expects to be discharged to:: Private residence Living Arrangements: Spouse/significant other Available Help at Discharge: Family Type of Home: Other(Comment) (townhouse) Home Access: Stairs to enter Entrance Stairs-Rails: Right Entrance Stairs-Number of Steps: 3   Home Layout: Multi-level;Able to live on main level with bedroom/bathroom Home Equipment: Grab bars - toilet;Grab bars - tub/shower;Shower seat;Rolling  Walker (2 wheels)      Prior Function Prior Level of Function : Independent/Modified Independent             Mobility Comments: Very active. Still plays golf and works with Investment banker, corporate [employer]   Dominant Hand: Right    Extremity/Trunk Assessment   Upper Extremity Assessment Upper Extremity Assessment: Overall WFL for tasks assessed     Lower Extremity Assessment Lower Extremity Assessment: Defer to PT evaluation RLE Deficits / Details: Increased swelling and bruising in R calf.    Cervical / Trunk Assessment Cervical / Trunk Assessment: Normal  Communication   Communication: No difficulties  Cognition Arousal/Alertness: Awake/alert Behavior During Therapy: WFL for tasks assessed/performed Overall Cognitive Status: Within Functional Limits for tasks assessed                                          General Comments General comments (skin integrity, edema, etc.): Orthostatic + - vitals listed above.    Exercises     Assessment/Plan    PT Assessment Patient needs continued PT services  PT Problem List Decreased strength;Decreased activity tolerance;Decreased balance;Decreased mobility;Decreased knowledge of use of DME;Decreased knowledge of precautions       PT Treatment Interventions DME instruction;Gait training;Stair training;Functional mobility training;Therapeutic activities;Therapeutic exercise;Balance training;Patient/family education    PT Goals (Current goals can be found in the Care Plan section)  Acute Rehab PT Goals Patient Stated Goal: to go home PT Goal Formulation: With patient Time For Goal Achievement: 09/10/21 Potential to Achieve Goals: Good    Frequency Min 3X/week     Co-evaluation PT/OT/SLP Co-Evaluation/Treatment: Yes Reason for Co-Treatment: For patient/therapist safety;To address functional/ADL transfers PT goals addressed during session: Mobility/safety with mobility;Balance OT goals addressed during session: ADL's and self-care       AM-PAC PT "6 Clicks" Mobility  Outcome Measure Help needed turning from your back to your side while in a flat bed without using bedrails?: A Little Help needed moving from lying on your back to sitting on the side of a flat bed without using bedrails?: A Little Help needed moving to and from a bed to a chair (including a  wheelchair)?: A Lot Help needed standing up from a chair using your arms (e.g., wheelchair or bedside chair)?: A Lot Help needed to walk in hospital room?: A Lot Help needed climbing 3-5 steps with a railing? : Total 6 Click Score: 13    End of Session   Activity Tolerance: Treatment limited secondary to medical complications (Comment) (+ orthostatics) Patient left: in bed;with call bell/phone within reach (on stretcher in ED) Nurse Communication: Mobility status PT Visit Diagnosis: Unsteadiness on feet (R26.81);Muscle weakness (generalized) (M62.81);Difficulty in walking, not elsewhere classified (R26.2)    Time: 4481-8563 PT Time Calculation (min) (ACUTE ONLY): 14 min   Charges:   PT Evaluation $PT Eval Moderate Complexity: 1 Mod          Reuel Derby, PT, DPT  Acute Rehabilitation Services  Office: [phone]   Rudean Hitt 08/27/2021, 11:43 AM

## 2021-08-27 NOTE — Assessment & Plan Note (Signed)
Stable

## 2021-08-27 NOTE — Assessment & Plan Note (Signed)
Likely caused by his acute blood loss anemia.  Continue with IV fluids.  Need to repeat his orthostatics.

## 2021-08-27 NOTE — Assessment & Plan Note (Signed)
Stable.  Continue Lipitor 20 mg daily. 

## 2021-08-27 NOTE — Assessment & Plan Note (Addendum)
Observation telemetry bed. Monitor H&H q6h. Hold Eliquis.  Patient on Eliquis for history of atrial fibrillation.  Given his spontaneous hemorrhage, continued systemic anticoagulation may not be in the patient's best interest.  Patient states that he has an allergic rash and aspirin.  May be best to keep him off anticoagulation until he can follow-up with his cardiologist to decide best course of action.  Spontaneous hemorrhage is a Acute illness/condition that poses a threat to life or bodily function.

## 2021-08-27 NOTE — Assessment & Plan Note (Addendum)
Stable.  Currently in normal sinus rhythm.  Continue Tikosyn to 250 mg twice daily.  Patient will need to hold his systemic anticoagulation for now given his spontaneous hemorrhage.  Discussed that spontaneous hemorrhage can occur in any body part including his brain.  Given his advanced age of 73, it may be in the patient's best interest to stop further systemic anticoagulation.  However he is allergic to aspirin.  It may be best to have the patient follow-up with cardiology after discharge so he can discuss with his cardiologist risk versus benefit of continued systemic anticoagulation.  Last dose of Eliquis was on Tuesday evening July 18.

## 2021-08-27 NOTE — H&P (Signed)
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  Patient has a history of paroxysmal atrial fibrillation currently on Eliquis and Tikosyn.  He is followed by Dr. Algernon Huxley with cardiology.  Patient also history of hypertension, CKD stage IIIa, hyperlipidemia, prior history of stroke.  Patient states he noticed initially some mild but then moderate pain in his right calf.  He noticed bruising and then more swelling of his right calf.  Presented to the ER today.  Noted to have a large right calf hematoma.  Patient's last dose of Eliquis was on the evening of August 25, 2021.  He is ready been in communication with his cardiologist about his hematoma.  In the ER, he had noted to have a 3 g drop in his hemoglobin along with being orthostatic on vital signs.  [facility] contacted for observation admission.   ED Course: 3 g drop in his hemoglobin over the last 3 months.  Current hemoglobin 9.3. 3 months ago, his hemoglobin was 11.3.  Review of Systems:  Review of Systems  Constitutional: Negative.   HENT: Negative.    Respiratory: Negative.    Cardiovascular: Negative.   Gastrointestinal: Negative.   Genitourinary: Negative.   Musculoskeletal:        Right calf pain and bruising.  Skin: Negative.   Neurological: Negative.   Endo/Heme/Allergies: Negative.   Psychiatric/Behavioral: Negative.    All other systems reviewed and are negative.   Past Medical History:  Diagnosis Date   Acute hemorrhagic infarction of brain [facility])     Allergic rhinitis    Anxiety    Asthma    Atrial fibrillation ([place])    CAD (coronary artery disease)    CHF (congestive heart failure) ([place])    Depression    Diverticulosis 03/16/1995   DJD (degenerative joint disease)    "most joints" (03/11/2015)   GERD (gastroesophageal reflux disease) 12/30/2000   Heart attack ([place]) 1984   Hiatal hernia 12/30/2000   History of blood transfusion 1988   "related to GI bleeding OR"   History of duodenal ulcer 10/15/1986   Hyperlipidemia    Hypertension    ICH (intracerebral hemorrhage) ([place])    Sepsis ([place]) 12/2018   Sinus bradycardia    Stroke [facility])     Past Surgical History:  Procedure Laterality Date   CARDIAC CATHETERIZATION     CATARACT EXTRACTION W/ INTRAOCULAR LENS  IMPLANT, BILATERAL Bilateral ?2013   [place] WITH STENT PLACEMENT  2010   hospital ccu  1984   heart attack, hypoplastic right coronary   INGUINAL HERNIA REPAIR Right ~ [place] Bilateral 06/10/2017   Procedure: BILATERAL ORCHIECTOMY;  Surgeon: Franchot Gallo, MD;  Location: [facility] ORS;  Service: Urology;  Laterality: Bilateral;  MAC ANESTHESIA AND LOCAL   TONSILLECTOMY     TOTAL KNEE ARTHROPLASTY Bilateral 1998-2004   "right-left"   VAGOTOMY AND PYLOROPLASTY  1988   "bleeding duodenal ulcer"     reports that he  has never smoked. He has never used smokeless tobacco. He reports that he does not drink alcohol and does not use drugs.  Allergies  Allergen Reactions   Tylenol [Acetaminophen] Rash   Azithromycin    Advil [Ibuprofen] Nausea Only and Rash   Asa [Aspirin] Rash    Family History  Problem Relation Age of Onset   Coronary artery disease Mother        deceased   Deep vein thrombosis Sister    Coronary artery disease Brother    Asthma Brother    Colon cancer Neg Hx     Prior to Admission medications   Medication Sig Start Date End Date Taking? Authorizing Provider   albuterol (PROVENTIL) (2.5 MG/3ML) 0.083% nebulizer solution Inhale 3 mLs (2.5 mg total) into the lungs every 4 (four) hours as needed for wheezing or shortness of breath. 03/07/21   Geradine Girt, DO  albuterol (VENTOLIN HFA) 108 (90 Base) MCG/ACT inhaler Inhale 2 puffs into the lungs every 4 (four) hours as needed for wheezing or shortness of breath. 08/29/19   Brand Males, MD  atorvastatin (LIPITOR) 20 MG tablet Take 1 tablet by mouth once daily 12/22/20   Burchette, Alinda Sierras, MD  buPROPion (WELLBUTRIN SR) 150 MG 12 hr tablet TAKE 1 TABLET(150 MG) BY MOUTH DAILY 01/14/21   Burchette, Alinda Sierras, MD  cetirizine (ZYRTEC) 10 MG tablet Take 10 mg by mouth daily with supper.     [provider]  Cholecalciferol (VITAMIN D) 2000 units tablet Take 2,000 Units by mouth daily.    [provider]  dofetilide (TIKOSYN) 250 MCG capsule TAKE 1 CAPSULE(250 MCG) BY MOUTH TWICE DAILY 07/27/21   Larey Dresser, MD  ELIQUIS 5 MG TABS tablet TAKE 1 TABLET(5 MG) BY MOUTH TWICE DAILY 05/11/21   Larey Dresser, MD  FLUoxetine (PROZAC) 10 MG capsule Take 1 capsule (10 mg total) by mouth daily. 01/14/21   Burchette, Alinda Sierras, MD  fluticasone (FLONASE) 50 MCG/ACT nasal spray SHAKE LIQUID AND USE 2 SPRAYS IN EACH NOSTRIL DAILY 09/19/19   Brand Males, MD  Fluticasone Furoate (ARNUITY ELLIPTA) 200 MCG/ACT AEPB Inhale 1 puff into the lungs daily. 03/12/21   Brand Males, MD  LORazepam (ATIVAN) 1 MG tablet TAKE 1/2 TO 1 TABLET BY MOUTH AT BEDTIME AS NEEDED 07/22/21   Burchette, Alinda Sierras, MD  nitroGLYCERIN (NITROSTAT) 0.4 MG SL tablet Place 1 tablet (0.4 mg total) under the tongue every 5 (five) minutes x 3 doses as needed for chest pain. 01/04/17   [name], MD  omeprazole (PRILOSEC OTC) 20 MG tablet Take 1 tablet (20 mg total) by mouth daily. 09/17/13   [name], MD  tamsulosin (FLOMAX) 0.4 MG CAPS capsule TAKE 1 CAPSULE(0.4 MG) BY MOUTH DAILY 12/22/20   [name], MD     Physical Exam: Vitals:   08/27/21 0027 08/27/21 0030 08/27/21 0100 08/27/21 0200  BP:  (!) 111/58 (!) 108/51 (!) 117/59  Pulse:  70 69 74  Resp:  _0 Temp: 97.8 F (36.6 C)     TempSrc: Oral     SpO2:  99% 97% 98%    Physical Exam Constitutional:      General: He is not in acute distress.    Appearance: Normal appearance. He is not ill-appearing, toxic-appearing or diaphoretic.  HENT:     Head: Normocephalic and atraumatic.     Nose: Nose normal.  Eyes:     General: No scleral icterus. Cardiovascular:  Rate and Rhythm: Normal rate and regular rhythm.     Heart sounds: Murmur heard.  Pulmonary:     Effort: Pulmonary effort is normal. No respiratory distress.  Abdominal:     General: Abdomen is flat. Bowel sounds are normal. There is no distension.     Palpations: Abdomen is soft.     Tenderness: There is no abdominal tenderness.  Skin:    General: Skin is warm.     Capillary Refill: Capillary refill takes less than 2 seconds.     Findings: Bruising present.     Comments: See picture of right calf hematoma  Neurological:     General: No focal deficit present.     Mental Status: He is alert and oriented to person, place, and time.         Labs on Admission: I have personally reviewed following labs and imaging studies  CBC: Recent Labs  Lab 08/26/21 2320  WBC 11.4*  NEUTROABS 9.2*  HGB 9.3*  HCT 29.0*  MCV 94.5  PLT 794   Basic Metabolic Panel: Recent Labs  Lab 08/26/21 2320  NA 136  K 4.3  CL 106  CO2 24  GLUCOSE 115*  BUN 23  CREATININE 1.19  CALCIUM 8.4*  MG 1.9   GFR: CrCl cannot be calculated (Unknown ideal weight.). Liver Function Tests: Recent Labs  Lab 08/26/21 2320  AST 14*  ALT 14  ALKPHOS 47  BILITOT 0.6  PROT 5.8*  ALBUMIN 3.2*   No results for input(s): "LIPASE", "AMYLASE" in the last 168 hours. No results for input(s): "AMMONIA" in the last 168 hours. Coagulation Profile: No results for input(s): "INR",  "PROTIME" in the last 168 hours. Cardiac Enzymes: Recent Labs  Lab 08/26/21 2320  CKTOTAL 35*   BNP (last 3 results) No results for input(s): "PROBNP" in the last 8760 hours. HbA1C: No results for input(s): "HGBA1C" in the last 72 hours. CBG: No results for input(s): "GLUCAP" in the last 168 hours. Lipid Profile: No results for input(s): "CHOL", "HDL", "LDLCALC", "TRIG", "CHOLHDL", "LDLDIRECT" in the last 72 hours. Thyroid Function Tests: No results for input(s): "TSH", "T4TOTAL", "FREET4", "T3FREE", "THYROIDAB" in the last 72 hours. Anemia Panel: No results for input(s): "VITAMINB12", "FOLATE", "FERRITIN", "TIBC", "IRON", "RETICCTPCT" in the last 72 hours. Urine analysis:    Component Value Date/Time   COLORURINE AMBER (A) 12/26/2018 1640   APPEARANCEUR CLOUDY (A) 12/26/2018 1640   LABSPEC 1.016 12/26/2018 1640   PHURINE 5.0 12/26/2018 1640   GLUCOSEU NEGATIVE 12/26/2018 1640   HGBUR MODERATE (A) 12/26/2018 1640   HGBUR trace-lysed 07/18/2009 1022   BILIRUBINUR NEGATIVE 12/26/2018 1640   BILIRUBINUR N 01/04/2017 1524   KETONESUR NEGATIVE 12/26/2018 1640   PROTEINUR 100 (A) 12/26/2018 1640   UROBILINOGEN 0.2 01/04/2017 1524   UROBILINOGEN 0.2 07/18/2009 1022   NITRITE NEGATIVE 12/26/2018 1640   LEUKOCYTESUR LARGE (A) 12/26/2018 1640    Radiological Exams on Admission: I have personally reviewed images No results found.  EKG: My personal interpretation of EKG shows: NSR    Assessment/Plan Principal Problem:   Spontaneous hemorrhage Active Problems:   Acute blood loss anemia   Orthostatic hypotension   Hyperlipidemia   Essential hypertension   Paroxysmal atrial fibrillation (HCC)   Stage 3a chronic kidney disease (CKD) (HCC)    Assessment and Plan: * Spontaneous hemorrhage Observation telemetry bed. Monitor H&H q6h. Hold Eliquis.  Patient on Eliquis for history of atrial fibrillation.  Given his spontaneous hemorrhage, continued systemic anticoagulation may  not be  in the patient's best interest.  Patient states that he has an allergic rash and aspirin.  May be best to keep him off anticoagulation until he can follow-up with his cardiologist to decide best course of action.  Spontaneous hemorrhage is a Acute illness/condition that poses a threat to life or bodily function.   Orthostatic hypotension Likely caused by his acute blood loss anemia.  Continue with IV fluids.  Need to repeat his orthostatics.  Acute blood loss anemia Patient lost about 3 g/dL since his last labs about 3 weeks ago.  His right calf is quite ecchymotic.  Continue to monitor his H&H's.  Stage 3a chronic kidney disease (CKD) (HCC) Stable.  Paroxysmal atrial fibrillation (HCC) Stable.  Currently in normal sinus rhythm.  Continue Tikosyn to 250 mg twice daily.  Patient will need to hold his systemic anticoagulation for now given his spontaneous hemorrhage.  Discussed that spontaneous hemorrhage can occur in any body part including his brain.  Given his advanced age of 65, it may be in the patient's best interest to stop further systemic anticoagulation.  However he is allergic to aspirin.  It may be best to have the patient follow-up with cardiology after discharge so he can discuss with his cardiologist risk versus benefit of continued systemic anticoagulation.  Last dose of Eliquis was on Tuesday evening July 18.  Essential hypertension Stable.  Patient slightly orthostatic given his recent blood loss.  Hyperlipidemia Stable.  Continue Lipitor 20 mg daily.   DVT prophylaxis: SCDs Code Status: Full Code Family Communication: no family at bedside  Disposition Plan: return home  Consults called: none  Admission status: Observation, Telemetry bed   Kristopher Oppenheim, DO [facility] 08/27/2021, 3:04 AM

## 2021-08-27 NOTE — Assessment & Plan Note (Addendum)
Patient lost about 3 g/dL since his last labs about 3 months ago.  His right calf is quite ecchymotic.  Continue to monitor his H&H's.

## 2021-08-27 NOTE — Evaluation (Signed)
Occupational Therapy Evaluation Patient Details Name: [name] MCBAIN, MD MRN: [id number] DOB: August 14, 1929 Today's Date: 08/27/2021   History of Present Illness Pt is a 88 y/o male admitted secondary to increased swelling and bruising in R calf. Thought to be spontaneous hemorrhage. Pt also with orthostatic hypotension. PMH includes HTN, a fib, CKD, CVA, and CHF.   Clinical Impression   Pt admitted for concerns listed above. PTA pt reported that he was independent with all ADL's and IADL's, including exercising and golfing. At this time time, pt presents with orthostatic hypotension and pain in his RLE.  He is requiring min guard due to orthostatic hypotension at this time. Anticipate that pt will progress well with mobility and ADL's, as BP resolves. OT will follow acutely to assess progression.      Recommendations for follow up therapy are one component of a multi-disciplinary discharge planning process, led by the attending physician.  Recommendations may be updated based on patient status, additional functional criteria and insurance authorization.   Follow Up Recommendations  No OT follow up    Assistance Recommended at Discharge Set up Supervision/Assistance  Patient can return home with the following A little help with walking and/or transfers;A little help with bathing/dressing/bathroom;Assistance with cooking/housework    Functional Status Assessment  Patient has had a recent decline in their functional status and demonstrates the ability to make significant improvements in function in a reasonable and predictable amount of time.  Equipment Recommendations  None recommended by OT    Recommendations for Other Services       Precautions / Restrictions Precautions Precautions: Fall Precaution Comments: watch BP Restrictions Weight Bearing Restrictions: No      Mobility Bed Mobility Overal bed mobility: Needs Assistance Bed Mobility: Supine to Sit, Sit to Supine      Supine to sit: Min guard Sit to supine: Min guard   General bed mobility comments: Min guard for safety due to orthostatics    Transfers                   General transfer comment: deferred due to orthostatics      Balance Overall balance assessment: Mild deficits observed, not formally tested                                         ADL either performed or assessed with clinical judgement   ADL Overall ADL's : Needs assistance/impaired                                       General ADL Comments: Min guard for safety     Vision Ability to See in Adequate Light: 0 Adequate Patient Visual Report: No change from baseline Vision Assessment?: No apparent visual deficits     Perception     Praxis      Pertinent Vitals/Pain Pain Assessment Pain Assessment: No/denies pain     Hand Dominance Right   Extremity/Trunk Assessment Upper Extremity Assessment Upper Extremity Assessment: Overall WFL for tasks assessed   Lower Extremity Assessment Lower Extremity Assessment: Defer to PT evaluation RLE Deficits / Details: Increased swelling and bruising in R calf.   Cervical / Trunk Assessment Cervical / Trunk Assessment: Normal   Communication Communication Communication: No difficulties   Cognition Arousal/Alertness: Awake/alert Behavior During Therapy: WFL for  tasks assessed/performed Overall Cognitive Status: Within Functional Limits for tasks assessed                                       General Comments  Orthostatic + -BP in supine 112/51 and BP in sitting 83/48.    Exercises     Shoulder Instructions      Home Living Family/patient expects to be discharged to:: Private residence Living Arrangements: Spouse/significant other Available Help at Discharge: Family Type of Home: Other(Comment) (townhouse) Home Access: Stairs to enter Technical brewer of Steps: 3 Entrance Stairs-Rails: Right Home  Layout: Multi-level;Able to live on main level with bedroom/bathroom     Bathroom Shower/Tub: Occupational psychologist: Handicapped height     Home Equipment: Grab bars - toilet;Grab bars - tub/shower;Shower Land (2 wheels)          Prior Functioning/Environment Prior Level of Function : Independent/Modified Independent             Mobility Comments: Very active. Still plays golf and works with Physiological scientist          OT Problem List: Decreased activity tolerance;Decreased safety awareness;Cardiopulmonary status limiting activity      OT Treatment/Interventions: Self-care/ADL training;Therapeutic exercise;Energy conservation;DME and/or AE instruction;Therapeutic activities;Patient/family education;Balance training    OT Goals(Current goals can be found in the care plan section) Acute Rehab OT Goals Patient Stated Goal: To go home OT Goal Formulation: With patient Time For Goal Achievement: 09/10/21 Potential to Achieve Goals: Good  OT Frequency: Min 1X/week    Co-evaluation PT/OT/SLP Co-Evaluation/Treatment: Yes Reason for Co-Treatment: For patient/therapist safety;To address functional/ADL transfers PT goals addressed during session: Mobility/safety with mobility;Balance OT goals addressed during session: ADL's and self-care      AM-PAC OT "6 Clicks" Daily Activity     Outcome Measure Help from another person eating meals?: A Little Help from another person taking care of personal grooming?: A Little Help from another person toileting, which includes using toliet, bedpan, or urinal?: A Little Help from another person bathing (including washing, rinsing, drying)?: A Little Help from another person to put on and taking off regular upper body clothing?: A Little Help from another person to put on and taking off regular lower body clothing?: A Little 6 Click Score: 18   End of Session Nurse Communication: Mobility status  Activity  Tolerance: Patient tolerated treatment well Patient left: in bed;with call bell/phone within reach  OT Visit Diagnosis: Unsteadiness on feet (R26.81);Muscle weakness (generalized) (M62.81)                Time: 8921-1941 OT Time Calculation (min): 15 min Charges:  OT General Charges $OT Visit: 1 Visit OT Evaluation $OT Eval Low Complexity: 1 Low  Eletha Culbertson H., OTR/L Acute Rehabilitation  Takina Busser Elane Keighan Amezcua 08/27/2021, 11:44 AM

## 2021-08-28 DIAGNOSIS — T45515A Adverse effect of anticoagulants, initial encounter: Secondary | ICD-10-CM | POA: Diagnosis present

## 2021-08-28 DIAGNOSIS — N1831 Chronic kidney disease, stage 3a: Secondary | ICD-10-CM | POA: Diagnosis not present

## 2021-08-28 DIAGNOSIS — E860 Dehydration: Secondary | ICD-10-CM | POA: Diagnosis not present

## 2021-08-28 DIAGNOSIS — I480 Paroxysmal atrial fibrillation: Secondary | ICD-10-CM | POA: Diagnosis not present

## 2021-08-28 DIAGNOSIS — I951 Orthostatic hypotension: Secondary | ICD-10-CM | POA: Diagnosis present

## 2021-08-28 DIAGNOSIS — Z886 Allergy status to analgesic agent status: Secondary | ICD-10-CM | POA: Diagnosis not present

## 2021-08-28 DIAGNOSIS — I2510 Atherosclerotic heart disease of native coronary artery without angina pectoris: Secondary | ICD-10-CM | POA: Diagnosis present

## 2021-08-28 DIAGNOSIS — D62 Acute posthemorrhagic anemia: Secondary | ICD-10-CM | POA: Diagnosis not present

## 2021-08-28 DIAGNOSIS — Z8546 Personal history of malignant neoplasm of prostate: Secondary | ICD-10-CM | POA: Diagnosis not present

## 2021-08-28 DIAGNOSIS — Z955 Presence of coronary angioplasty implant and graft: Secondary | ICD-10-CM | POA: Diagnosis not present

## 2021-08-28 DIAGNOSIS — I4892 Unspecified atrial flutter: Secondary | ICD-10-CM | POA: Diagnosis not present

## 2021-08-28 DIAGNOSIS — I252 Old myocardial infarction: Secondary | ICD-10-CM | POA: Diagnosis not present

## 2021-08-28 DIAGNOSIS — I130 Hypertensive heart and chronic kidney disease with heart failure and stage 1 through stage 4 chronic kidney disease, or unspecified chronic kidney disease: Secondary | ICD-10-CM | POA: Diagnosis not present

## 2021-08-28 DIAGNOSIS — I5032 Chronic diastolic (congestive) heart failure: Secondary | ICD-10-CM | POA: Diagnosis not present

## 2021-08-28 DIAGNOSIS — J45909 Unspecified asthma, uncomplicated: Secondary | ICD-10-CM | POA: Diagnosis not present

## 2021-08-28 DIAGNOSIS — R58 Hemorrhage, not elsewhere classified: Secondary | ICD-10-CM | POA: Diagnosis present

## 2021-08-28 DIAGNOSIS — Z881 Allergy status to other antibiotic agents status: Secondary | ICD-10-CM | POA: Diagnosis not present

## 2021-08-28 DIAGNOSIS — Z96653 Presence of artificial knee joint, bilateral: Secondary | ICD-10-CM | POA: Diagnosis present

## 2021-08-28 DIAGNOSIS — Z825 Family history of asthma and other chronic lower respiratory diseases: Secondary | ICD-10-CM | POA: Diagnosis not present

## 2021-08-28 DIAGNOSIS — Z20822 Contact with and (suspected) exposure to covid-19: Secondary | ICD-10-CM | POA: Diagnosis not present

## 2021-08-28 DIAGNOSIS — F32A Depression, unspecified: Secondary | ICD-10-CM | POA: Diagnosis not present

## 2021-08-28 DIAGNOSIS — D6832 Hemorrhagic disorder due to extrinsic circulating anticoagulants: Secondary | ICD-10-CM | POA: Diagnosis not present

## 2021-08-28 DIAGNOSIS — E785 Hyperlipidemia, unspecified: Secondary | ICD-10-CM | POA: Diagnosis not present

## 2021-08-28 DIAGNOSIS — S8011XA Contusion of right lower leg, initial encounter: Secondary | ICD-10-CM | POA: Diagnosis present

## 2021-08-28 DIAGNOSIS — Z8673 Personal history of transient ischemic attack (TIA), and cerebral infarction without residual deficits: Secondary | ICD-10-CM | POA: Diagnosis not present

## 2021-08-28 LAB — BASIC METABOLIC PANEL
Anion gap: 4 — ABNORMAL LOW (ref 5–15)
BUN: 18 mg/dL (ref 8–23)
CO2: 23 mmol/L (ref 22–32)
Calcium: 8 mg/dL — ABNORMAL LOW (ref 8.9–10.3)
Chloride: 107 mmol/L (ref 98–111)
Creatinine, Ser: 1.14 mg/dL (ref 0.61–1.24)
GFR, Estimated: >60 mL/min (ref >60)
Glucose, Bld: 101 mg/dL — ABNORMAL HIGH (ref 70–99)
Potassium: 4.5 mmol/L (ref 3.5–5.1)
Sodium: 134 mmol/L — ABNORMAL LOW (ref 135–145)

## 2021-08-28 LAB — HEMOGLOBIN AND HEMATOCRIT, BLOOD
HCT: 23.3 % — ABNORMAL LOW (ref 39.0–52.0)
HCT: 23.5 % — ABNORMAL LOW (ref 39.0–52.0)
Hemoglobin: 7.5 g/dL — ABNORMAL LOW (ref 13.0–17.0)
Hemoglobin: 7.8 g/dL — ABNORMAL LOW (ref 13.0–17.0)

## 2021-08-28 LAB — MAGNESIUM: Magnesium: 1.8 mg/dL (ref 1.7–2.4)

## 2021-08-28 NOTE — Discharge Summary (Signed)
Physician Discharge Summary   Patient: [name] CARMICKLE, [name] [name] MRN: [id number] DOB: 10-02-1929  Admit date:     08/26/2021  Discharge date: 08/28/21  Discharge Physician: Cordelia Poche, [name] [name]   PCP: [name], [name] [name]   Recommendations at discharge:  Stop Eliquis PCP/Cardiology follow-up  Discharge Diagnoses: Principal Problem:   Spontaneous hemorrhage Active Problems:   Acute blood loss anemia   Orthostatic hypotension   Hyperlipidemia   Essential hypertension   Paroxysmal atrial fibrillation (HCC)   Stage 3a chronic kidney disease (CKD) ([place])  Resolved Problems:   * No resolved hospital problems. *  Assessment and Plan:  Spontaneous hemorrhage In setting of Eliquis use for paroxysmal atrial fibrillation. Associated anemia and orthostatic hypotension (although this is a chronic issue, per patient). Patient evaluated by PT who recommended home health PT service. OT recommended no outpatient follow-up. Patient instructed to hold Eliquis on discharge pending improvement of hematoma.    Orthostatic hypotension Chronic issue, per patient. Possibly worsened by blood loss. Patient asymptomatic.   Acute blood loss anemia Secondary to hematoma. Hemoglobin drifted down; 9.3 > 7.8 g/dL. Patient remains asymptomatic. Recommended iron supplementation on discharge.   CKD stage IIIa Stable.   Paroxysmal atrial fibrillation Patient is in sinus rhythm. He is managed on Tikosyn and is on Eliquis for stroke prophylaxis. QTc is elevated on EKG. Electrophysiology consulted with recommendations to continue Tikosyn at this time. Continue Tikosyn on discharge.   Primary hypertension Noted, although patient is not on antihypertensives as an outpatient. History of orthostatic hypotension.   Hyperlipidemia Continue Lipitor   Depression Continue Wellbutrin and Prozac  Consultants: Electrophysiology Procedures performed: None  Disposition: Home health Diet recommendation: Heart  healthy   DISCHARGE MEDICATION: Allergies as of 08/28/2021       Reactions   Tylenol [acetaminophen] Rash   Azithromycin Other (See Comments)   Causes Afib   Advil [ibuprofen] Nausea Only, Rash   Asa [aspirin] Rash        Medication List     STOP taking these medications    albuterol (2.5 MG/3ML) 0.083% nebulizer solution Commonly known as: PROVENTIL   albuterol 108 (90 Base) MCG/ACT inhaler Commonly known as: VENTOLIN HFA   Eliquis 5 MG Tabs tablet Generic drug: apixaban   fluticasone 50 MCG/ACT nasal spray Commonly known as: FLONASE       TAKE these medications    Arnuity Ellipta 200 MCG/ACT Aepb Generic drug: Fluticasone Furoate Inhale 1 puff into the lungs daily.   atorvastatin 20 MG tablet Commonly known as: LIPITOR Take 1 tablet by mouth once daily What changed:  how much to take how to take this when to take this additional instructions   beclomethasone 42 MCG/SPRAY nasal spray Commonly known as: BECONASE-AQ Place 1 spray into both nostrils 2 (two) times daily.   buPROPion 150 MG 12 hr tablet Commonly known as: WELLBUTRIN SR TAKE 1 TABLET(150 MG) BY MOUTH DAILY What changed:  how much to take how to take this when to take this additional instructions   cetirizine 10 MG tablet Commonly known as: ZYRTEC Take 10 mg by mouth daily with supper.   denosumab 60 MG/ML Sosy injection Commonly known as: PROLIA Inject 60 mg into the skin every 6 (six) months.   dofetilide 250 MCG capsule Commonly known as: TIKOSYN TAKE 1 CAPSULE(250 MCG) BY MOUTH TWICE DAILY What changed: See the new instructions.   FLUoxetine 10 MG capsule Commonly known as: PROZAC Take 1 capsule (10 mg total) by mouth daily.  LACTAID PO Take 1 tablet by mouth as needed (lactose intolerant).   LORazepam 1 MG tablet Commonly known as: ATIVAN TAKE 1/2 TO 1 TABLET BY MOUTH AT BEDTIME AS NEEDED What changed: when to take this   nitroGLYCERIN 0.4 MG SL tablet Commonly  known as: NITROSTAT Place 1 tablet (0.4 mg total) under the tongue every 5 (five) minutes x 3 doses as needed for chest pain.   omeprazole 20 MG tablet Commonly known as: PRILOSEC OTC Take 1 tablet (20 mg total) by mouth daily.   SYSTANE OP Place 1 drop into both eyes as needed (dry eye).   tamsulosin 0.4 MG Caps capsule Commonly known as: FLOMAX TAKE 1 CAPSULE(0.4 MG) BY MOUTH DAILY What changed:  how much to take how to take this when to take this additional instructions   traMADol 50 MG tablet Commonly known as: ULTRAM Take 50 mg by mouth as needed (pain).   Vitamin D 50 MCG (2000 UT) tablet Take 2,000 Units by mouth daily.        Follow-up Information     Burchette, Alinda Sierras, [name] [name]. Schedule an appointment as soon as possible for a visit in 1 week(s).   Specialty: Family Medicine Why: For hospital follow-up Contact information: [place] [address] [phone]                Discharge Exam: BP (!) 116/55 (BP Location: Left Arm)   Pulse 80   Temp 98.1 F (36.7 C) (Oral)   Resp 17   Ht '5\' 10"'$  (1.778 m)   Wt 70 kg   SpO2 96%   BMI 22.14 kg/m   General exam: Appears calm and comfortable Respiratory system: Clear to auscultation. Respiratory effort normal. Cardiovascular system: S1 & S2 heard, RRR. No murmurs, rubs, gallops or clicks. Central nervous system: Alert and oriented. No focal neurological deficits. Psychiatry: Judgement and insight appear normal. Mood & affect appropriate.   Condition at discharge: stable  The results of significant diagnostics from this hospitalization (including imaging, microbiology, ancillary and laboratory) are listed below for reference.   Imaging Studies: No results found.  Microbiology: Results for orders placed or performed during the hospital encounter of 03/04/21  Resp Panel by RT-PCR (Flu A&B, Covid) Nasopharyngeal Swab     Status: Abnormal   Collection Time: 03/04/21  9:51 PM    Specimen: Nasopharyngeal Swab; Nasopharyngeal(NP) swabs in vial transport medium  Result Value Ref Range Status   SARS Coronavirus 2 by RT PCR NEGATIVE NEGATIVE Final    Comment: (NOTE) SARS-CoV-2 target nucleic acids are NOT DETECTED.  The SARS-CoV-2 RNA is generally detectable in upper respiratory specimens during the acute phase of infection. The lowest concentration of SARS-CoV-2 viral copies this assay can detect is 138 copies/mL. A negative result does not preclude SARS-Cov-2 infection and should not be used as the sole basis for treatment or other patient management decisions. A negative result may occur with  improper specimen collection/handling, submission of specimen other than nasopharyngeal swab, presence of viral mutation(s) within the areas targeted by this assay, and inadequate number of viral copies(<138 copies/mL). A negative result must be combined with clinical observations, patient history, and epidemiological information. The expected result is Negative.  Fact Sheet for Patients:  EntrepreneurPulse.com.au  Fact Sheet for Healthcare Providers:  IncredibleEmployment.be  This test is no t yet approved or cleared by the [place] FDA and  has been authorized for detection and/or diagnosis of SARS-CoV-2 by FDA under an Emergency Use  Authorization (EUA). This EUA will remain  in effect (meaning this test can be used) for the duration of the COVID-19 declaration under Section 564(b)(1) of the Act, 21 U.S.C.section 360bbb-3(b)(1), unless the authorization is terminated  or revoked sooner.       Influenza A by PCR POSITIVE (A) NEGATIVE Final   Influenza B by PCR NEGATIVE NEGATIVE Final    Comment: (NOTE) The Xpert Xpress SARS-CoV-2/FLU/RSV plus assay is intended as an aid in the diagnosis of influenza from Nasopharyngeal swab specimens and should not be used as a sole basis for treatment. Nasal washings and aspirates are  unacceptable for Xpert Xpress SARS-CoV-2/FLU/RSV testing.  Fact Sheet for Patients: EntrepreneurPulse.com.au  Fact Sheet for Healthcare Providers: IncredibleEmployment.be  This test is not yet approved or cleared by the [place] FDA and has been authorized for detection and/or diagnosis of SARS-CoV-2 by FDA under an Emergency Use Authorization (EUA). This EUA will remain in effect (meaning this test can be used) for the duration of the COVID-19 declaration under Section 564(b)(1) of the Act, 21 U.S.C. section 360bbb-3(b)(1), unless the authorization is terminated or revoked.  Performed at [facility], [place] [address], [place], [place] [address]   Resp Panel by RT-PCR (Flu A&B, Covid) Nasopharyngeal Swab     Status: Abnormal   Collection Time: 03/05/21 10:52 AM   Specimen: Nasopharyngeal Swab; Nasopharyngeal(NP) swabs in vial transport medium  Result Value Ref Range Status   SARS Coronavirus 2 by RT PCR NEGATIVE NEGATIVE Final    Comment: (NOTE) SARS-CoV-2 target nucleic acids are NOT DETECTED.  The SARS-CoV-2 RNA is generally detectable in upper respiratory specimens during the acute phase of infection. The lowest concentration of SARS-CoV-2 viral copies this assay can detect is 138 copies/mL. A negative result does not preclude SARS-Cov-2 infection and should not be used as the sole basis for treatment or other patient management decisions. A negative result may occur with  improper specimen collection/handling, submission of specimen other than nasopharyngeal swab, presence of viral mutation(s) within the areas targeted by this assay, and inadequate number of viral copies(<138 copies/mL). A negative result must be combined with clinical observations, patient history, and epidemiological information. The expected result is Negative.  Fact Sheet for Patients:  EntrepreneurPulse.com.au  Fact Sheet for Healthcare  Providers:  IncredibleEmployment.be  This test is no t yet approved or cleared by the [place] FDA and  has been authorized for detection and/or diagnosis of SARS-CoV-2 by FDA under an Emergency Use Authorization (EUA). This EUA will remain  in effect (meaning this test can be used) for the duration of the COVID-19 declaration under Section 564(b)(1) of the Act, 21 U.S.C.section 360bbb-3(b)(1), unless the authorization is terminated  or revoked sooner.       Influenza A by PCR POSITIVE (A) NEGATIVE Final   Influenza B by PCR NEGATIVE NEGATIVE Final    Comment: (NOTE) The Xpert Xpress SARS-CoV-2/FLU/RSV plus assay is intended as an aid in the diagnosis of influenza from Nasopharyngeal swab specimens and should not be used as a sole basis for treatment. Nasal washings and aspirates are unacceptable for Xpert Xpress SARS-CoV-2/FLU/RSV testing.  Fact Sheet for Patients: EntrepreneurPulse.com.au  Fact Sheet for Healthcare Providers: IncredibleEmployment.be  This test is not yet approved or cleared by the [place] FDA and has been authorized for detection and/or diagnosis of SARS-CoV-2 by FDA under an Emergency Use Authorization (EUA). This EUA will remain in effect (meaning this test can be used) for the duration of the COVID-19  declaration under Section 564(b)(1) of the Act, 21 U.S.C. section 360bbb-3(b)(1), unless the authorization is terminated or revoked.  Performed at [facility], [place] [address] [place], [place] [address]   MRSA Next Gen by PCR, Nasal     Status: None   Collection Time: 03/05/21  8:34 PM   Specimen: Nasal Mucosa; Nasal Swab  Result Value Ref Range Status   MRSA by PCR Next Gen NOT DETECTED NOT DETECTED Final    Comment: (NOTE) The GeneXpert MRSA Assay (FDA approved for NASAL specimens only), is one component of a comprehensive MRSA colonization surveillance program. It is not  intended to diagnose MRSA infection nor to guide or monitor treatment for MRSA infections. Test performance is not FDA approved in patients less than 100 years old. Performed at [facility], [place] [address] [place], [place] [address]   Expectorated Sputum Assessment w Gram Stain, Rflx to Resp Cult     Status: None   Collection Time: 03/06/21 11:53 AM   Specimen: Expectorated Sputum  Result Value Ref Range Status   Specimen Description EXPECTORATED SPUTUM  Final   Special Requests NONE  Final   Sputum evaluation   Final    THIS SPECIMEN IS ACCEPTABLE FOR SPUTUM CULTURE Performed at [facility], [place] [address], [place], [place] [address]    Report Status 03/06/2021 FINAL  Final  Culture, Respiratory w Gram Stain     Status: None   Collection Time: 03/06/21 11:53 AM  Result Value Ref Range Status   Specimen Description EXPECTORATED SPUTUM  Final   Special Requests NONE Reflexed from [id number]  Final   Gram Stain   Final    MODERATE WBC PRESENT,BOTH PMN AND MONONUCLEAR NO ORGANISMS SEEN Performed at [facility], [place] [address] [place], [place] [address]    Culture MODERATE CANDIDA ALBICANS  Final   Report Status 03/08/2021 FINAL  Final    Labs: CBC: Recent Labs  Lab 08/26/21 2320 08/27/21 0808 08/27/21 1500 08/27/21 2003 08/28/21 0404 08/28/21 0946  WBC 11.4*  --   --   --   --   --   NEUTROABS 9.2*  --   --   --   --   --   HGB 9.3* 8.5* 7.9* 8.2* 7.5* 7.8*  HCT 29.0* 27.0* 25.1* 25.0* 23.3* 23.5*  MCV 94.5  --   --   --   --   --   PLT 204  --   --   --   --   --    Basic Metabolic Panel: Recent Labs  Lab 08/26/21 2320 08/28/21 0404  NA 136 134*  K 4.3 4.5  CL 106 107  CO2 24 23  GLUCOSE 115* 101*  BUN 23 18  CREATININE 1.19 1.14  CALCIUM 8.4* 8.0*  MG 1.9 1.8   Liver Function Tests: Recent Labs  Lab 08/26/21 2320  AST 14*  ALT 14  ALKPHOS 47  BILITOT 0.6  PROT 5.8*  ALBUMIN 3.2*   Discharge time spent: 35  minutes.  Signed: Cordelia Poche, [name] [name] [facility] 08/28/2021

## 2021-08-28 NOTE — Progress Notes (Signed)
Physical Therapy Treatment Patient Details Name: [name] COMMON, MD MRN: [id number] DOB: [birth year] Today's Date: 08/28/2021   History of Present Illness Pt is a 88 y/o male admitted secondary to increased swelling and bruising in R calf. Thought to be spontaneous hemorrhage. Pt also with orthostatic hypotension. PMH includes HTN, a fib, CKD, CVA, and CHF.    PT Comments    Patient is agreeable to PT and cooperative throughout session. He is hopeful to discharge home today. Patient educated to monitor for symptoms of dizziness with mobility transitions for fall prevention at home. No dizziness reported with mobility today. Patient ambulated in hallway with rolling walker and a short distance in the room without rolling walker. Recommend to use walker for improved gait pattern and posture. PT will continue to follow while in the hospital.    Recommendations for follow up therapy are one component of a multi-disciplinary discharge planning process, led by the attending physician.  Recommendations may be updated based on patient status, additional functional criteria and insurance authorization.  Follow Up Recommendations  Home health PT     Assistance Recommended at Discharge Intermittent Supervision/Assistance  Patient can return home with the following A little help with walking and/or transfers;A little help with bathing/dressing/bathroom;Assistance with cooking/housework;Help with stairs or ramp for entrance;Assist for transportation   Equipment Recommendations  None recommended by PT    Recommendations for Other Services       Precautions / Restrictions Precautions Precautions: Fall Restrictions Weight Bearing Restrictions: No     Mobility  Bed Mobility Overal bed mobility: Needs Assistance Bed Mobility: Supine to Sit, Sit to Supine     Supine to sit: Supervision     General bed mobility comments: supervision for safety with no use of bed rails.    Transfers Overall  transfer level: Needs assistance Equipment used: Rolling walker (2 wheels) Transfers: Sit to/from Stand Sit to Stand: Supervision           General transfer comment: supervision for safety. educated patient to monitor for symptoms of dizziness prior to standing for fall prevention at home. no dizziness is reported with functionl mobility efforts today    Ambulation/Gait Ambulation/Gait assistance: Supervision, Min guard Gait Distance (Feet): 130 Feet Assistive device: Rolling walker (2 wheels) (and briefly without rolling walker) Gait Pattern/deviations: Trunk flexed, Decreased stride length Gait velocity: decreased     General Gait Details: patient ambulated in hallway without loss of balance. occasional cues to keep rolling walker closer to base of support. without rolling walker, patient has decreased step length and increased trunk flexion. recommended to use walker at home for safety with ambulation. he has a rollator in place already. no dizziness is reported with activity   Stairs             Wheelchair Mobility    Modified Rankin (Stroke Patients Only)       Balance Overall balance assessment: Mild deficits observed, not formally tested                                          Cognition Arousal/Alertness: Awake/alert Behavior During Therapy: WFL for tasks assessed/performed Overall Cognitive Status: Within Functional Limits for tasks assessed  Exercises      General Comments        Pertinent Vitals/Pain Pain Assessment Pain Assessment: Faces Faces Pain Scale: Hurts a little bit Pain Location: R calf Pain Descriptors / Indicators: Sore Pain Intervention(s): Limited activity within patient's tolerance, Monitored during session    Home Living                          Prior Function            PT Goals (current goals can now be found in the care plan  section) Acute Rehab PT Goals Patient Stated Goal: to go home today PT Goal Formulation: With patient Time For Goal Achievement: 09/10/21 Potential to Achieve Goals: Good Progress towards PT goals: Progressing toward goals    Frequency    Min 3X/week      PT Plan Current plan remains appropriate    Co-evaluation              AM-PAC PT "6 Clicks" Mobility   Outcome Measure  Help needed turning from your back to your side while in a flat bed without using bedrails?: None Help needed moving from lying on your back to sitting on the side of a flat bed without using bedrails?: A Little Help needed moving to and from a bed to a chair (including a wheelchair)?: A Little Help needed standing up from a chair using your arms (e.g., wheelchair or bedside chair)?: A Little Help needed to walk in hospital room?: A Little Help needed climbing 3-5 steps with a railing? : A Little 6 Click Score: 19    End of Session   Activity Tolerance: Patient tolerated treatment well Patient left: with call bell/phone within reach;with bed alarm set   PT Visit Diagnosis: Unsteadiness on feet (R26.81);Muscle weakness (generalized) (M62.81);Difficulty in walking, not elsewhere classified (R26.2)     Time: 9417-4081 PT Time Calculation (min) (ACUTE ONLY): 17 min  Charges:  $Gait Training: 8-22 mins                     Minna Merritts, PT, MPT    Percell Locus 08/28/2021, 12:59 PM

## 2021-08-28 NOTE — Discharge Instructions (Signed)
[name], MD,  You were in the hospital because of bleeding into your calf. This was precipitated by your use of Xarelto. Thankfully your blood counts have stabilized. Please hold your Xarelto until you discuss with your cardiologist on when to resume.

## 2021-08-28 NOTE — TOC Transition Note (Addendum)
Transition of Care [facility]) - CM/SW Discharge Note   Patient Details  Name: [name] CAREW, MD MRN: [id number] Date of Birth: 11-27-1929  [facility] CM/SW Contact:  Tom-Johnson, Renea Ee, RN Phone Number: 08/28/2021, 3:06 PM   Clinical Narrative:     Patient is scheduled for discharge today. Home health referral called to [facility] and Delsa Sale voiced acceptance. Patient was active with their services before and requests to resume care. Info on AVS. States he has a Physiological scientist twice a week.  Denies any other needs. Wife to transport at discharge. No further TOC needs noted.   Final next level of care: [place] Barriers to Discharge: Barriers Resolved   Patient Goals and CMS Choice Patient states their goals for this hospitalization and ongoing recovery are:: To return home CMS Medicare.gov Compare Post Acute Care list provided to:: Patient Choice offered to / list presented to : Patient, Spouse  Discharge Placement                Patient to be transferred to facility by: Wife      Discharge Plan and Services                DME Arranged: N/A DME Agency: NA       HH Arranged: PT, OT HH Agency: [facility] Date [place] Agency Contacted: 08/28/21 Time HH Agency Contacted: 1330 Representative spoke with at [place]: Delsa Sale  Social Determinants of Health ([place]) Interventions     Readmission Risk Interventions     No data to display

## 2021-08-28 NOTE — Progress Notes (Signed)
Pt discharged home in stable condition 

## 2021-08-28 NOTE — Progress Notes (Signed)
Progress Note  Patient Name: [name], MD Date of Encounter: 08/28/2021  [facility] Cardiologist: Dr. Aundra Dubin  Subjective   Ambulated last night without dizziness, leg is less painful today  Inpatient Medications    Scheduled Meds:  atorvastatin  20 mg Oral Daily   buPROPion  150 mg Oral Daily   dofetilide  250 mcg Oral BID   FLUoxetine  10 mg Oral Daily   tamsulosin  0.4 mg Oral Daily   Continuous Infusions:  PRN Meds: oxyCODONE   Vital Signs    Vitals:   08/28/21 0022 08/28/21 0043 08/28/21 0441 08/28/21 0808  BP: 104/62  (!) 104/56 (!) 116/55  Pulse: 73  72 80  Resp: '17  17 17  '$ Temp: 98.4 F (36.9 C)   98.1 F (36.7 C)  TempSrc:    Oral  SpO2: 95%  96% 96%  Weight:  70 kg    Height:  '5\' 10"'$  (1.778 m)     No intake or output data in the 24 hours ending 08/28/21 0837    08/28/2021   12:43 AM 05/27/2021    9:04 AM 04/15/2021    3:30 PM  Last 3 Weights  Weight (lbs) 154 lb 5.2 oz 154 lb 6.4 oz 154 lb  Weight (kg) 70 kg 70.035 kg 69.854 kg      Telemetry    SR 80's - Personally Reviewed  ECG    SR 78, QTc 474m - Personally Reviewed  Physical Exam   GEN: No acute distress.   Neck: No JVD Cardiac: RRR, no murmurs, rubs, or gallops.  Respiratory: CTA. GI: Soft, nontender, non-distended  MS: No peripheral edema, RLE hematoma is softer today, advanced/age appropriate atrophy. Neuro:  Nonfocal  Psych: Normal affect   Labs    High Sensitivity Troponin:  No results for input(s): "TROPONINIHS" in the last 720 hours.   Chemistry Recent Labs  Lab 08/26/21 2320 08/28/21 0404  NA 136 134*  K 4.3 4.5  CL 106 107  CO2 24 23  GLUCOSE 115* 101*  BUN 23 18  CREATININE 1.19 1.14  CALCIUM 8.4* 8.0*  MG 1.9 1.8  PROT 5.8*  --   ALBUMIN 3.2*  --   AST 14*  --   ALT 14  --   ALKPHOS 47  --   BILITOT 0.6  --   GFRNONAA 58* >60  ANIONGAP 6 4*    Lipids No results for input(s): "CHOL", "TRIG", "HDL", "LABVLDL", "LDLCALC", "CHOLHDL" in the  last 168 hours.  Hematology Recent Labs  Lab 08/26/21 2320 08/27/21 0808 08/27/21 1500 08/27/21 2003 08/28/21 0404  WBC 11.4*  --   --   --   --   RBC 3.07*  --   --   --   --   HGB 9.3*   < > 7.9* 8.2* 7.5*  HCT 29.0*   < > 25.1* 25.0* 23.3*  MCV 94.5  --   --   --   --   MCH 30.3  --   --   --   --   MCHC 32.1  --   --   --   --   RDW 14.2  --   --   --   --   PLT 204  --   --   --   --    < > = values in this interval not displayed.   Thyroid No results for input(s): "TSH", "FREET4" in the last 168 hours.  BNPNo  results for input(s): "BNP", "PROBNP" in the last 168 hours.  DDimer No results for input(s): "DDIMER" in the last 168 hours.   Radiology    No results found.  Cardiac Studies   09/22/2017: TTE Study Conclusions  - Left ventricle: The cavity size was normal. Systolic function was    normal. The estimated ejection fraction was in the range of 60%    to 65%. Wall motion was normal; there were no regional wall    motion abnormalities. Doppler parameters are consistent with    abnormal left ventricular relaxation (grade 1 diastolic    dysfunction).  - Aortic valve: Trileaflet; moderately thickened, moderately    calcified leaflets. There was trivial regurgitation.  - Mitral valve: Calcified annulus. Mildly thickened leaflets .    There was trivial regurgitation.  - Left atrium: The atrium was mildly dilated. Volume/bsa, S: 36.7    ml/m^2.  - Tricuspid valve: There was severe regurgitation.  - Pulmonary arteries: Systolic pressure was moderately increased.    PA peak pressure: 62 mm Hg (S).   Impressions:   - Compared to the prior study, there has been no significant    interval change.   Patient Profile     88 y.o. male with a hx of  asthma, CAD (RV infarct 1980s w/occl RCA, PCI to Cx Nov 2010), AFib, stroke, prostate ca (mets s/p b/l orchiectomy), chronic CHF (diastolic), VHD (severe TR), HTN, HLD admitted with RLE hematoma  Assessment & Plan     Paroxysmal Afib,atypical AFlutter has been mentioned CHA2DS2Vasc is 7, his eliquis held with acute calf hematoma Home tikosyn dose is 229mg (appropriate for his renal function)  Maintaining SR Lytes and renal function stable QTc is stable Has an appt in place with Dr. MAundra DubinWill arrange EP follow up  EP will sign off, please recall if needed    For questions or updates, please contact C[place]inityPlease consult [email or web address] for contact info under        Signed, RBaldwin Jamaica PA-C  08/28/2021, 8:37 AM

## 2021-08-31 ENCOUNTER — Ambulatory Visit (INDEPENDENT_AMBULATORY_CARE_PROVIDER_SITE_OTHER): Payer: PPO | Admitting: Family Medicine

## 2021-08-31 ENCOUNTER — Encounter: Payer: Self-pay | Admitting: Family Medicine

## 2021-08-31 VITALS — BP 120/66 | HR 80 | Temp 97.4°F | Ht 70.0 in | Wt 154.7 lb

## 2021-08-31 DIAGNOSIS — D62 Acute posthemorrhagic anemia: Secondary | ICD-10-CM

## 2021-08-31 DIAGNOSIS — S8011XA Contusion of right lower leg, initial encounter: Secondary | ICD-10-CM

## 2021-08-31 MED ORDER — MUPIROCIN 2 % EX OINT: 1.0000 | TOPICAL_OINTMENT | Freq: Two times a day (BID) | CUTANEOUS | 1 refills | Status: DC

## 2021-08-31 MED ORDER — MUPIROCIN CALCIUM 2 % EX CREA: 1.0000 | TOPICAL_CREAM | Freq: Two times a day (BID) | CUTANEOUS | 1 refills | Status: DC

## 2021-08-31 NOTE — Addendum Note (Signed)
Addended by: [name] on: 08/31/2021 05:36 PM   Modules accepted: Orders

## 2021-08-31 NOTE — Patient Instructions (Signed)
Clean gently with anti-bacterial soap daily such as Dial or Lever 2000  Use topical Bactroban cream to open areas once or twice daily  Follow up immediately for any increased redness, warmth, or fever.

## 2021-08-31 NOTE — Progress Notes (Addendum)
Established Patient Office Visit  Subjective   Patient ID: [name] Spence, MD, male    DOB: 1929/09/22  Age: 88 y.o. 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  He is a retired [employer].  He noted spontaneous hemorrhage in the right calf that happened couple days prior to recent admission.  History of atrial fibrillation on Eliquis and Tikosyn.  Denies any recent injury.  He had tremendous bruising and swelling and had fairly profound fatigue and dizziness which prompted ER visit.  He had tried some compression and elevation and icing without much improvement.  Was noted to have large right calf hematoma.  In ER had approximately 3 g drop in hemoglobin with orthostatic changes on vitals.  Was admitted for further observation.  His hemoglobin stabilized with the lowest 7.5.  7.8 at discharge.  Eliquis was discontinued.  He feels the right lower extremity swelling has gone down some but he has a couple of areas of weeping drainage and skin breakdown now.  Along the medial aspect of the right calf has area of acute hemorrhage about 10 x 10 cm with some weeping along one portion.  No ulcerations.  No erythema.  Extensive bruising involving the entire leg and also extending up to the proximal thigh.  No significant warmth.  Baseline hemoglobin around 11.  His initial hemoglobin in the ER was 9.3 and this drifted down to 7.5.  He has some chronic dizziness but overall improved compared to admission.  Was instructed to take some iron.  Is trying to focus on dietary intake.  Currently not taking any iron supplement.  Denies any fever or chills.  He states the leg edema has gone down tremendously from last week but still has considerable swelling  Past Medical History:  Diagnosis Date   Acute hemorrhagic infarction of brain [facility])    Allergic rhinitis    Anxiety     Asthma    Atrial fibrillation (HCC)    CAD (coronary artery disease)    CHF (congestive heart failure) ([place])    Depression    Diverticulosis 03/16/1995   DJD (degenerative joint disease)    "most joints" (03/11/2015)   GERD (gastroesophageal reflux disease) 12/30/2000   Heart attack ([place]) 1984   Hiatal hernia 12/30/2000   History of blood transfusion 1988   "related to GI bleeding OR"   History of duodenal ulcer 10/15/1986   Hyperlipidemia    Hypertension    ICH (intracerebral hemorrhage) ([place])    Sepsis ([place]) 12/2018   Sinus bradycardia    Stroke [facility])    Past Surgical History:  Procedure Laterality Date   CARDIAC CATHETERIZATION     CATARACT EXTRACTION W/ INTRAOCULAR LENS  IMPLANT, BILATERAL Bilateral ?2013   [place] WITH STENT PLACEMENT  2010   hospital ccu  1984   heart attack, hypoplastic right coronary   INGUINAL HERNIA REPAIR Right ~ [place] Bilateral 06/10/2017   Procedure: BILATERAL ORCHIECTOMY;  Surgeon: Franchot Gallo, MD;  Location: [facility] ORS;  Service: Urology;  Laterality: Bilateral;  MAC ANESTHESIA AND LOCAL   TONSILLECTOMY     TOTAL KNEE ARTHROPLASTY Bilateral 1998-2004   "right-left"   VAGOTOMY AND PYLOROPLASTY  1988   "bleeding duodenal ulcer"    reports that he has never  smoked. He has never used smokeless tobacco. He reports that he does not drink alcohol and does not use drugs. family history includes Asthma in his brother; Coronary artery disease in his brother and mother; Deep vein thrombosis in his sister. Allergies  Allergen Reactions   Tylenol [Acetaminophen] Rash   Azithromycin Other (See Comments)    Causes Afib   Advil [Ibuprofen] Nausea Only and Rash   Asa [Aspirin] Rash    Review of Systems  Constitutional:  Negative for chills and fever.  Respiratory:  Negative for shortness of breath.   Cardiovascular:  Negative for chest pain.  Neurological:  Positive  for dizziness. Negative for focal weakness and loss of consciousness.      Objective:     BP 120/66 (BP Location: Left Arm, Patient Position: Sitting, Cuff Size: Normal)   Pulse 80   Temp (!) 97.4 F (36.3 C) (Oral)   Ht _0  (1.778 m)   Wt 154 lb 11.2 oz (70.2 kg)   SpO2 99%   BMI 22.20 kg/m    Physical Exam Vitals reviewed.  Cardiovascular:     Rate and Rhythm: Normal rate and regular rhythm.  Musculoskeletal:     Comments: He has extensive swelling and bruising right lower extremity mostly below the knee involving the leg region.  Along the medial right calf he has a hemorrhagic area about 10 x 10 cm.  Couple areas of small oozing.  He has 1 area of oozing along the right lateral leg proximally as well.  No erythema. Good capillary refill throughout.  Neurological:     Mental Status: He is alert.      No results found for any visits on 08/31/21.    The ASCVD Risk score (Arnett DK, et al., 2019) failed to calculate for the following reasons:   The 2019 ASCVD risk score is only valid for ages 80 to 42    Assessment & Plan:   Spontaneous bleed and hematoma right calf in a patient who was previously taking Eliquis.  He had drop in hemoglobin to 7.5 and has significant swelling with a couple areas of serosanguineous oozing in the right calf region.  No signs of cellulitis currently.  -Gently clean skin with good antibacterial soap once daily such as Lever 2000 or Dial -Consider Bactroban cream to areas of early breakdown once or twice daily -We placed Telfa over areas of drainage and light gauze dressing because of the significant oozing with outer stockinette -Watch closely for any signs of secondary infection -Continue frequent elevation -Set up follow-up for Friday to reassess and sooner for any concerns -Consider repeat CBC at follow-up -Remain off Eliquis.  Fortunately, at this time he appears to be in sinus rhythm  Return in about 4 days (around 09/04/2021).    Patient called back.  Given difficulties with home dressing changes and travel we will set up home health referral for wound care help  Carolann Littler, MD

## 2021-08-31 NOTE — Addendum Note (Signed)
Addended by: [name] on: 08/31/2021 02:22 PM   Modules accepted: Orders

## 2021-09-02 ENCOUNTER — Other Ambulatory Visit: Payer: Self-pay

## 2021-09-02 NOTE — Patient Outreach (Signed)
  Care Coordination [facility] Note Transition Care Management Follow-up Telephone Call Date of discharge and from where: 08/28/21 [facility] How have you been since you were released from the hospital? Doing better Any questions or concerns? Yes home health for wound care has not contacted  Items Reviewed: Did the pt receive and understand the discharge instructions provided? Yes  Medications obtained and verified? Yes  Other? Yes  wound care Any new allergies since your discharge? No  Dietary orders reviewed? Yes Do you have support at home? Yes   Home Care and Equipment/Supplies: Were home health services ordered? yes If so, what is the name of the agency? [facility]  Has the agency set up a time to come to the patient's home? no Were any new equipment or medical supplies ordered?  No What is the name of the medical supply agency? N.A Were you able to get the supplies/equipment? not applicable Do you have any questions related to the use of the equipment or supplies? No  Functional Questionnaire: (I = Independent and D = Dependent) ADLs: I  Bathing/Dressing- I  Meal Prep- I  Eating- I  Maintaining continence- I  Transferring/Ambulation- I  Managing Meds- I  Follow up appointments reviewed:  PCP Hospital f/u appt confirmed? Yes  Scheduled to see Dr. Elease Hashimoto on 09/04/21 @ 7 AM. [place] [facility] f/u appt confirmed? Yes  Scheduled to see Dr. Aundra Dubin on 09/07/21 @ 9:40 AM. Are transportation arrangements needed? No  If their condition worsens, is the pt aware to call PCP or go to the Emergency Dept.? Yes Was the patient provided with contact information for the PCP's office or ED? Yes Was to pt encouraged to call back with questions or concerns? Yes  SDOH assessments and interventions completed:   Yes  Care Coordination Interventions Activated:  Yes Care Coordination Interventions:  Referred for Care Coordination Services:  RN Care Coordinator  follow up on home health referral   Encounter Outcome:  Pt. Visit Completed Peter Garter RN, BSN,CCM, CDE Care Management Coordinator [place] Management [phone]

## 2021-09-03 ENCOUNTER — Telehealth: Payer: Self-pay | Admitting: *Deleted

## 2021-09-03 ENCOUNTER — Telehealth: Payer: Self-pay | Admitting: Family Medicine

## 2021-09-03 ENCOUNTER — Telehealth: Payer: Self-pay

## 2021-09-03 NOTE — Telephone Encounter (Signed)
Received a call from MD regarding patient concerns that he has not been contacted by a home health agency for a referral that was placed on 08/31/21.  This RN contacted referral coordinator. On 09/01/21 referral was sent to [facility].  Response from [facility] on 09/02/21 is that they are at capacity. 09/02/21 late afternoon referral was sent to [place] 09/03/21 1100 - received update from referral coordinator that she will resend the referral to [place]; she is currently still awaiting a response from them & states she will keep updated.  Of note: pt has 4 day f/u appt with PCP on 09/04/21 at 7am.

## 2021-09-03 NOTE — Patient Outreach (Signed)
  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 I  was able to engage.  What matters to the patients health and wellness today?  Unsuccessful outreach   Goals Addressed   None     SDOH assessments and interventions completed:   No   Care Coordination Interventions Activated:  No Care Coordination Interventions:  No, not indicated  Follow up plan: Follow up call scheduled for over the 8/1 '@11AM'$   Encounter Outcome:  No Answer   Raina Mina, RN Care Management Coordinator [place] Office [phone]

## 2021-09-03 NOTE — Patient Outreach (Signed)
  Care Coordination   Initial Visit Note   09/03/2021 Name: [name] DENIS, [name] [name] MRN: [id number] DOB: [birth year]  [name], [name] [name] is a 88 y.o. year old male who sees Burchette, Alinda Sierras, [name] [name] for primary care. I spoke with  [name], [name] [name] by phone today  What matters to the patients health and wellness today?  Hhealth delay   Goals Addressed               This Visit's Progress     Found out the delay with [facility]) (pt-stated)        Care Coordination Interventions: Advised patient to to continue to check with the provider's office on the appointment day tomorrow for updates if available Collaborated with Dr. Elease Hashimoto office representative regarding referral orders with [facility]. Office representative indicated orders were sent on 7/24 to [facility] for wound care. Also stated services would be delayed 2-3 days at that time.  Reviewed scheduled/upcoming provider appointments including pending 09/04/2021 @ 7:00 AM.  RN Case Manager has contacted the local [facility] [phone] and representative indicated pt was not in the system. I was transferred to another representative and LVM with [facility].   I have also reach out to the [facility] dept for possible representative that is able to check the system directly for any details on the delay of services. Currently awaiting a cb. Will update team accordingly.          SDOH assessments and interventions completed:   No   Care Coordination Interventions Activated:  Yes Care Coordination Interventions:  Yes, provided  Follow up plan:  with any new information concerning [facility] services.   Encounter Outcome:  Pt. Scheduled   Raina Mina, RN Care Management Coordinator [place] Office [phone]

## 2021-09-03 NOTE — Patient Outreach (Signed)
  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 I  spoke with Freddie Breech with [facility], pt and updated Dr. Elease Hashimoto office concerning the pending [facility] services with [facility].  What matters to the patients health and wellness today?  Still awaiting a call for a scheduled home visit with [facility].   Goals Addressed               This Visit's Progress     No call from [facility] for a home visit has occurred (pt-stated)         Care Management   Outreach Note  09/03/2021 Name: JIOVANI MCCAMMON, MD MRN: [id number] DOB: [birth year]  Successful outreach to Freddie Breech [facility]), patient and Dr. Elease Hashimoto office administrator.  Follow Up Plan:  The care management team will reach out to the patient again over the next 3 business days.  Pt has a follow up appointment with PCP 7/28 '@7'$ :00AM. RN informed by [facility] that the pt will be call for the initial home visit. Pt is expecting a call for these arrangements.  Raina Mina, RN Care Management Coordinator [place] Office [phone]         SDOH assessments and interventions completed:   No   Care Coordination Interventions Activated:  Yes Care Coordination Interventions:  Yes, provided  Follow up plan:  Pt scheduled for another outreach next week to verify [facility] services as began.  Encounter Outcome:  Pt. Visit Completed   Raina Mina, RN Care Management Coordinator [place] Office [phone]

## 2021-09-03 NOTE — Telephone Encounter (Signed)
From referral coordinator on 09/03/21 at 1428: "Hey! [facility] responded back while I was at lunch saying that they could see him but it'd be next week. They're asking if that's okay"

## 2021-09-03 NOTE — Telephone Encounter (Signed)
[facility] will call patient and arrange home care, pt has not had any home care visits from them. Contact is Freddie Breech [phone]

## 2021-09-04 ENCOUNTER — Ambulatory Visit (INDEPENDENT_AMBULATORY_CARE_PROVIDER_SITE_OTHER): Payer: PPO | Admitting: Family Medicine

## 2021-09-04 ENCOUNTER — Encounter: Payer: Self-pay | Admitting: Family Medicine

## 2021-09-04 VITALS — BP 106/60 | HR 80 | Temp 97.7°F | Ht 70.0 in | Wt 157.2 lb

## 2021-09-04 DIAGNOSIS — S8011XD Contusion of right lower leg, subsequent encounter: Secondary | ICD-10-CM

## 2021-09-04 DIAGNOSIS — I959 Hypotension, unspecified: Secondary | ICD-10-CM

## 2021-09-04 DIAGNOSIS — R58 Hemorrhage, not elsewhere classified: Secondary | ICD-10-CM | POA: Diagnosis not present

## 2021-09-04 DIAGNOSIS — R600 Localized edema: Secondary | ICD-10-CM

## 2021-09-04 DIAGNOSIS — D500 Iron deficiency anemia secondary to blood loss (chronic): Secondary | ICD-10-CM | POA: Diagnosis not present

## 2021-09-04 NOTE — Patient Instructions (Signed)
Continue with wound care, as discussed  Hopefully, home health will be out in next few days  Follow up promptly for any signs of infection.

## 2021-09-04 NOTE — Telephone Encounter (Signed)
[facility] will call patient and arrange home care, pt has not had any home care visits from them. Contact is Freddie Breech [phone] per telephone call 09/03/2021

## 2021-09-04 NOTE — Progress Notes (Signed)
Established Patient Office Visit  Subjective   Patient ID: [name], MD, male    DOB: [birth year]  Age: 88 y.o. MRN: [id number]  Chief Complaint  Patient presents with   Fatigue   Follow-up    HPI   Here for follow-up check right leg wound.  Refer to previous note for details.  Spontaneous hemorrhage right calf when he was taking Eliquis.  Now off Eliquis.  He remains on Tikosyn.  He has follow-up with cardiology Monday.  Swelling has gone down some.  He had large hemorrhagic area right calf with some superficial weeping.  No cellulitis changes.  No fevers or chills.  He had 3 g drop in hemoglobin.  Does have some dizziness with standing.  Takes Flomax but no other medications that would be dropping blood pressure substantially.  Dyspnea with exertion.  Mild orthopnea when completely flat.  No increased left lower extremity edema.  Discharge hemoglobin from recent admission 7.8.  We had challenges getting home health company out to see him and finally should have someone coming out today or possibly Monday.  They have been cleaning right lower extremity daily with Dial soap and applying topical mupirocin ointment.  Keeping covered secondary to weeping drainage   Past Medical History:  Diagnosis Date   Acute hemorrhagic infarction of brain [facility])    Allergic rhinitis    Anxiety    Asthma    Atrial fibrillation ([place])    CAD (coronary artery disease)    CHF (congestive heart failure) ([place])    Depression    Diverticulosis 03/16/1995   DJD (degenerative joint disease)    "most joints" (03/11/2015)   GERD (gastroesophageal reflux disease) 12/30/2000   Heart attack ([place]) 1984   Hiatal hernia 12/30/2000   History of blood transfusion 1988   "related to GI bleeding OR"   History of duodenal ulcer 10/15/1986   Hyperlipidemia    Hypertension    ICH (intracerebral hemorrhage) ([place])    Sepsis ([place]) 12/2018   Sinus bradycardia    Stroke [facility])    Past Surgical History:  Procedure  Laterality Date   CARDIAC CATHETERIZATION     CATARACT EXTRACTION W/ INTRAOCULAR LENS  IMPLANT, BILATERAL Bilateral ?2013   [place] WITH STENT PLACEMENT  2010   hospital ccu  1984   heart attack, hypoplastic right coronary   INGUINAL HERNIA REPAIR Right ~ [place] Bilateral 06/10/2017   Procedure: BILATERAL ORCHIECTOMY;  Surgeon: Franchot Gallo, MD;  Location: [facility] ORS;  Service: Urology;  Laterality: Bilateral;  MAC ANESTHESIA AND LOCAL   TONSILLECTOMY     TOTAL KNEE ARTHROPLASTY Bilateral 1998-2004   "right-left"   VAGOTOMY AND PYLOROPLASTY  1988   "bleeding duodenal ulcer"    reports that he has never smoked. He has never used smokeless tobacco. He reports that he does not drink alcohol and does not use drugs. family history includes Asthma in his brother; Coronary artery disease in his brother and mother; Deep vein thrombosis in his sister. Allergies  Allergen Reactions   Tylenol [Acetaminophen] Rash   Azithromycin Other (See Comments)    Causes Afib   Advil [Ibuprofen] Nausea Only and Rash   Asa [Aspirin] Rash    Review of Systems  Constitutional:  Negative for chills and fever.  Respiratory:  Positive for shortness of breath.   Cardiovascular:  Negative for chest pain.      Objective:  BP 106/60 (BP Location: Left Arm, Patient Position: Sitting, Cuff Size: Normal)   Pulse 80   Temp 97.7 F (36.5 C) (Oral)   Ht _0  (1.778 m)   Wt 157 lb 3.2 oz (71.3 kg)   SpO2 98%   BMI 22.56 kg/m  BP Readings from Last 3 Encounters:  09/04/21 106/60  08/31/21 120/66  08/28/21 (!) 116/55   Wt Readings from Last 3 Encounters:  09/04/21 157 lb 3.2 oz (71.3 kg)  08/31/21 154 lb 11.2 oz (70.2 kg)  08/28/21 154 lb 5.2 oz (70 kg)      Physical Exam Vitals reviewed.  Cardiovascular:     Rate and Rhythm: Normal rate and regular rhythm.  Pulmonary:     Effort: Pulmonary effort is normal.      Breath sounds: Normal breath sounds. No wheezing or rales.  Musculoskeletal:     Comments: Significant edema right lower extremity with extensive bruising.  Large hemorrhagic area with skin sloughing right calf as previously noted.  No cellulitis changes.  No foul odor.  No necrosis.  Neurological:     Mental Status: He is alert.      No results found for any visits on 09/04/21.  Last CBC Lab Results  Component Value Date   WBC 11.4 (H) 08/26/2021   HGB 7.8 (L) 08/28/2021   HCT 23.5 (L) 08/28/2021   MCV 94.5 08/26/2021   MCH 30.3 08/26/2021   RDW 14.2 08/26/2021   PLT 204 08/04/9483   Last metabolic panel Lab Results  Component Value Date   GLUCOSE 101 (H) 08/28/2021   NA 134 (L) 08/28/2021   K 4.5 08/28/2021   CL 107 08/28/2021   CO2 23 08/28/2021   BUN 18 08/28/2021   CREATININE 1.14 08/28/2021   GFRNONAA >60 08/28/2021   CALCIUM 8.0 (L) 08/28/2021   PROT 5.8 (L) 08/26/2021   ALBUMIN 3.2 (L) 08/26/2021   BILITOT 0.6 08/26/2021   ALKPHOS 47 08/26/2021   AST 14 (L) 08/26/2021   ALT 14 08/26/2021   ANIONGAP 4 (L) 08/28/2021      The ASCVD Risk score (Arnett DK, et al., 2019) failed to calculate for the following reasons:   The 2019 ASCVD risk score is only valid for ages 2 to 18    Assessment & Plan:   Recent spontaneous bleed/hematoma right calf while he was taking Eliquis.  3 g drop in hemoglobin.  Large superficial wound from recent hemorrhage involving right calf.  No cellulitis changes.  He does have dizziness with standing and blood pressure went from 106/60 seated to 94/50 standing (very likely exacerbated by recent bleed and drop in hemoglobin)  He is on Flomax but do not feel he can come off that because of risk of urinary obstruction.  He is encouraged to increase fluid intake.  Change positions slowly. Has follow-up with cardiology Monday.  We discussed getting follow-up CBC and he would like to wait until he sees cardiology to see if he will be  getting labs then  We do have home health wound care nursing going out to see him soon to help monitor-and to assist with dressing changes      Carolann Littler, MD

## 2021-09-06 DIAGNOSIS — M7981 Nontraumatic hematoma of soft tissue: Secondary | ICD-10-CM | POA: Diagnosis not present

## 2021-09-06 DIAGNOSIS — F32A Depression, unspecified: Secondary | ICD-10-CM | POA: Diagnosis not present

## 2021-09-06 DIAGNOSIS — Z4800 Encounter for change or removal of nonsurgical wound dressing: Secondary | ICD-10-CM | POA: Diagnosis not present

## 2021-09-06 DIAGNOSIS — K5730 Diverticulosis of large intestine without perforation or abscess without bleeding: Secondary | ICD-10-CM | POA: Diagnosis not present

## 2021-09-06 DIAGNOSIS — Z961 Presence of intraocular lens: Secondary | ICD-10-CM | POA: Diagnosis not present

## 2021-09-06 DIAGNOSIS — Z96653 Presence of artificial knee joint, bilateral: Secondary | ICD-10-CM | POA: Diagnosis not present

## 2021-09-06 DIAGNOSIS — Z955 Presence of coronary angioplasty implant and graft: Secondary | ICD-10-CM | POA: Diagnosis not present

## 2021-09-06 DIAGNOSIS — M5136 Other intervertebral disc degeneration, lumbar region: Secondary | ICD-10-CM | POA: Diagnosis not present

## 2021-09-06 DIAGNOSIS — Z9089 Acquired absence of other organs: Secondary | ICD-10-CM | POA: Diagnosis not present

## 2021-09-06 DIAGNOSIS — N1831 Chronic kidney disease, stage 3a: Secondary | ICD-10-CM | POA: Diagnosis not present

## 2021-09-06 DIAGNOSIS — E785 Hyperlipidemia, unspecified: Secondary | ICD-10-CM | POA: Diagnosis not present

## 2021-09-06 DIAGNOSIS — Z9049 Acquired absence of other specified parts of digestive tract: Secondary | ICD-10-CM | POA: Diagnosis not present

## 2021-09-06 DIAGNOSIS — I130 Hypertensive heart and chronic kidney disease with heart failure and stage 1 through stage 4 chronic kidney disease, or unspecified chronic kidney disease: Secondary | ICD-10-CM | POA: Diagnosis not present

## 2021-09-06 DIAGNOSIS — Z9841 Cataract extraction status, right eye: Secondary | ICD-10-CM | POA: Diagnosis not present

## 2021-09-06 DIAGNOSIS — Z8674 Personal history of sudden cardiac arrest: Secondary | ICD-10-CM | POA: Diagnosis not present

## 2021-09-06 DIAGNOSIS — J45909 Unspecified asthma, uncomplicated: Secondary | ICD-10-CM | POA: Diagnosis not present

## 2021-09-06 DIAGNOSIS — I2510 Atherosclerotic heart disease of native coronary artery without angina pectoris: Secondary | ICD-10-CM | POA: Diagnosis not present

## 2021-09-06 DIAGNOSIS — J309 Allergic rhinitis, unspecified: Secondary | ICD-10-CM | POA: Diagnosis not present

## 2021-09-06 DIAGNOSIS — I480 Paroxysmal atrial fibrillation: Secondary | ICD-10-CM | POA: Diagnosis not present

## 2021-09-06 DIAGNOSIS — Z8673 Personal history of transient ischemic attack (TIA), and cerebral infarction without residual deficits: Secondary | ICD-10-CM | POA: Diagnosis not present

## 2021-09-06 DIAGNOSIS — Z7901 Long term (current) use of anticoagulants: Secondary | ICD-10-CM | POA: Diagnosis not present

## 2021-09-06 DIAGNOSIS — I951 Orthostatic hypotension: Secondary | ICD-10-CM | POA: Diagnosis not present

## 2021-09-06 DIAGNOSIS — I509 Heart failure, unspecified: Secondary | ICD-10-CM | POA: Diagnosis not present

## 2021-09-06 DIAGNOSIS — K219 Gastro-esophageal reflux disease without esophagitis: Secondary | ICD-10-CM | POA: Diagnosis not present

## 2021-09-06 DIAGNOSIS — D631 Anemia in chronic kidney disease: Secondary | ICD-10-CM | POA: Diagnosis not present

## 2021-09-06 DIAGNOSIS — F419 Anxiety disorder, unspecified: Secondary | ICD-10-CM | POA: Diagnosis not present

## 2021-09-06 DIAGNOSIS — Z7951 Long term (current) use of inhaled steroids: Secondary | ICD-10-CM | POA: Diagnosis not present

## 2021-09-07 ENCOUNTER — Ambulatory Visit (HOSPITAL_COMMUNITY)
Admit: 2021-09-07 | Discharge: 2021-09-07 | Disposition: A | Discharge status: Home / Self Care | Payer: PPO | Source: Ambulatory Visit | Attending: Cardiology | Admitting: Cardiology

## 2021-09-07 ENCOUNTER — Encounter (HOSPITAL_COMMUNITY): Payer: Self-pay | Admitting: Cardiology

## 2021-09-07 VITALS — BP 130/70 | HR 79 | Wt 157.2 lb

## 2021-09-07 DIAGNOSIS — I130 Hypertensive heart and chronic kidney disease with heart failure and stage 1 through stage 4 chronic kidney disease, or unspecified chronic kidney disease: Secondary | ICD-10-CM | POA: Insufficient documentation

## 2021-09-07 DIAGNOSIS — I25119 Atherosclerotic heart disease of native coronary artery with unspecified angina pectoris: Secondary | ICD-10-CM | POA: Diagnosis not present

## 2021-09-07 DIAGNOSIS — I480 Paroxysmal atrial fibrillation: Secondary | ICD-10-CM | POA: Diagnosis not present

## 2021-09-07 DIAGNOSIS — Y9389 Activity, other specified: Secondary | ICD-10-CM | POA: Diagnosis not present

## 2021-09-07 DIAGNOSIS — D631 Anemia in chronic kidney disease: Secondary | ICD-10-CM | POA: Insufficient documentation

## 2021-09-07 DIAGNOSIS — Z7901 Long term (current) use of anticoagulants: Secondary | ICD-10-CM | POA: Diagnosis not present

## 2021-09-07 DIAGNOSIS — N189 Chronic kidney disease, unspecified: Secondary | ICD-10-CM | POA: Diagnosis not present

## 2021-09-07 DIAGNOSIS — Y929 Unspecified place or not applicable: Secondary | ICD-10-CM | POA: Diagnosis not present

## 2021-09-07 DIAGNOSIS — I5032 Chronic diastolic (congestive) heart failure: Secondary | ICD-10-CM | POA: Diagnosis not present

## 2021-09-07 DIAGNOSIS — M25512 Pain in left shoulder: Secondary | ICD-10-CM | POA: Diagnosis not present

## 2021-09-07 DIAGNOSIS — R011 Cardiac murmur, unspecified: Secondary | ICD-10-CM | POA: Insufficient documentation

## 2021-09-07 DIAGNOSIS — R0602 Shortness of breath: Secondary | ICD-10-CM | POA: Insufficient documentation

## 2021-09-07 DIAGNOSIS — E785 Hyperlipidemia, unspecified: Secondary | ICD-10-CM | POA: Diagnosis not present

## 2021-09-07 DIAGNOSIS — S8011XA Contusion of right lower leg, initial encounter: Secondary | ICD-10-CM | POA: Diagnosis not present

## 2021-09-07 LAB — CBC
HCT: 26 % — ABNORMAL LOW (ref 39.0–52.0)
Hemoglobin: 8 g/dL — ABNORMAL LOW (ref 13.0–17.0)
MCH: 29.6 pg (ref 26.0–34.0)
MCHC: 30.8 g/dL (ref 30.0–36.0)
MCV: 96.3 fL (ref 80.0–100.0)
Platelets: 381 10*3/uL (ref 150–400)
RBC: 2.7 MIL/uL — ABNORMAL LOW (ref 4.22–5.81)
RDW: 14.8 % (ref 11.5–15.5)
WBC: 9.6 10*3/uL (ref 4.0–10.5)
nRBC: 0 % (ref 0.0–0.2)

## 2021-09-07 LAB — BASIC METABOLIC PANEL
Anion gap: 5 (ref 5–15)
BUN: 23 mg/dL (ref 8–23)
CO2: 21 mmol/L — ABNORMAL LOW (ref 22–32)
Calcium: 8.2 mg/dL — ABNORMAL LOW (ref 8.9–10.3)
Chloride: 107 mmol/L (ref 98–111)
Creatinine, Ser: 1.1 mg/dL (ref 0.61–1.24)
GFR, Estimated: >60 mL/min (ref >60)
Glucose, Bld: 94 mg/dL (ref 70–99)
Potassium: 5.1 mmol/L (ref 3.5–5.1)
Sodium: 133 mmol/L — ABNORMAL LOW (ref 135–145)

## 2021-09-07 LAB — IRON AND TIBC
Iron: 33 ug/dL — ABNORMAL LOW (ref 45–182)
Saturation Ratios: 10 % — ABNORMAL LOW (ref 17.9–39.5)
TIBC: 319 ug/dL (ref 250–450)
UIBC: 286 ug/dL

## 2021-09-07 LAB — FERRITIN: Ferritin: 89 ng/mL (ref 24–336)

## 2021-09-07 LAB — BRAIN NATRIURETIC PEPTIDE: B Natriuretic Peptide: 237 pg/mL — ABNORMAL HIGH (ref 0.0–100.0)

## 2021-09-07 NOTE — Patient Instructions (Signed)
There has been no changes to your medications.  Labs done today, your results will be available in MyChart, we will contact you for abnormal readings.  Please wear your compression stocking on your right leg.  Your physician recommends that you schedule a follow-up appointment in: 2 months   If you have any questions or concerns before your next appointment please send [place] a message through [place] or call our office at [phone].    TO LEAVE A MESSAGE FOR THE NURSE SELECT OPTION 2, PLEASE LEAVE A MESSAGE INCLUDING: YOUR NAME DATE OF BIRTH CALL BACK NUMBER REASON FOR CALL**this is important as we prioritize the call backs  YOU WILL RECEIVE A CALL BACK THE SAME DAY AS LONG AS YOU CALL BEFORE 4:00 PM  At the [facility], you and your health needs are our priority. As part of our continuing mission to provide you with exceptional heart care, we have created designated Provider Care Teams. These Care Teams include your primary Cardiologist (physician) and Advanced Practice Providers (APPs- Physician Assistants and Nurse Practitioners) who all work together to provide you with the care you need, when you need it.   You may see any of the following providers on your designated Care Team at your next follow up: Dr Glori Bickers Dr Haynes Kerns, NP Lyda Jester, [place] [facility] [place], [place] Audry Riles, PharmD   Please be sure to bring in all your medications bottles to every appointment.

## 2021-09-07 NOTE — Progress Notes (Signed)
Patient ID: [name], MD, male   DOB: 11-30-1929, 88 y.o.   MRN: [id number] PCP: [name], MD Cardiology: Dr. 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  He had an RV infarct with chronic total occlusion of a small nondominant RCA in the 1980s.  He had a drug-eluting stent to the CFX in 11/10. He is very symptomatic with atrial fibrillation episodes. He has had a chronic pattern of mild shortness of breath and mild left shoulder pain/chest aching when walking up a hill (x 20 years).   He can walk 3 miles on flat ground without problems.  He golfs 3-4 times a week.  He walks for 20-30 minutes on a treadmill several times a week.   Echo in 7/16 showed normal LV systolic function, EF 92-42% with moderate diastolic dysfunction.  Moderate TR with PA systolic pressure 65 mmHg.  Normal RV size and systolic function.  Given volume overload on exam and abnormal echo, he was started on low dose Lasix in the past.    He was started on Multaq but continued to have episodes of atrial fibrillation.  This was stopped, and he was admitted for Tikosyn loading.  After he went home, he developed left facial droop and left arm weakness.  He was found to have a small right parietal cortical infarct with hemorrhagic conversion.  His symptoms resolved quickly. INR was subtherapeutic at admission.  Warfarin was stopped and he was put on apixaban.   In 9/17, he was admitted with LLL PNA.  During the admission, he had a couple of relatively short runs of atrial fibrillation.  He developed more frequent episodes of atrial fibrillation at home and was started on Tikosyn.   In 5/19, he had bilateral orchiectomy for metastatic prostate cancer.  His PSA has come down considerably.   In 1/20, he had influenza that progressed to PNA.  He was hospitalized with transient atrial fibrillation.    In 11/20, he was  admitted with an E coli UTI and urosepsis with hypotension and AKI.  He was initially fluid-resuscitated and later required diuresis.  He was in atrial fibrillation while in the hospital.   Patient was admitted in 1/23 with influenza A as well as Strep pneumoniae PNA.  He was treated with oseltamivir and antibiotics. While in the hospital, he went into atrial fibrillation with controlled rate.    Patient returns for followup of atrial fibrillation. About 10 days ago, he developed a spontaneous right calf hematoma.  He had been working out with a Physiological scientist a day earlier but had no trauma to his leg.  He has had extensive bruising and swelling.  Hgb down to 7.8 when last checked.  He is off apixaban for now.  He initially noted orthostatic symptoms (lightheadedness with standing), but this has improved over the last few days.  He has been short of breath with moderate exertion.  No chest pain.  He is in NSR today and denies palpitations. Weight is up about 3 lbs.   ECG (personally reviewed): NSR, QTc 459 msec  REDS clip 24%   Labs (6/11): LDL 63, HDL 35, K 5.5, creatinine 1.1 Labs (6/12): LDL 58, HDL 53, TSH normal, K 4.7, creatinine 1 Labs (12/12): BNP 79 Labs (7/14): K 5, creatinine 1.1, LDL 48, HDL 34 Labs (8/15): LDL 48, HDL 33, TSH normal, K 5.4, creatinine 1.2 Labs (6/16):  K 5.1, creatinine 1.27, HCT 42.5 Labs (10/16): LDL 37, HDL 32 Labs (11/16): K 4.4, creatinine 1.36, BNP 103 Labs  (2/17): K 4.5, creatinine 1.38, Mg 2.0, LDL 32 Labs (3/17): HCT 41.7 Labs (5/17): K 4.2, creatinine 1.17, Mg 2.1 Labs (9/17): K 4.2, creatinine 1.16, HCT 37.3 Labs (11/17): hgb 13.3, LDL 40 Labs (5/18): K 4.5, creatinine 1.25 Labs (5/19): K 5.5, creatinine 1.16 Labs (2/20): K 4, creatinine 1.24, hgb 12.2 Labs (11/20): K 3.4, creatinine 1.34, hgb 10.3, LDL 49 Labs (12/20): K 4.5, creatinine 1.2 Labs (2/21): K 3.9, creatinine 1.5, TSH 4.5 (mildly elevated) Labs (5/21): TSH normal, K 5, creatinine  1.27 Labs (9/21): K 4.1, creatinine 1.22, LDL 40, TSH normal Labs (4/22): K 4.2, creatinine 1.11 Labs (8/22): hgb 12, LDL 36 Labs (10/22): K 4.5, creatinine 1.38 Labs (1/23): K 4.6, creatinine 1.15, hgb 11.4 Labs (4/23): LDL 37 Labs (7/23): hgb 7.8, K 4.5, creatinine 1.14  PMH: 1. CAD: RV infarct in the 1980s.  Chronic total occlusion of a small nondominant RCA.  11/10 PCI to CFX with Xience DES.  EF 60% on LV-gram at that time. ETT (11/14) with 9'07" exercise, no significant ST depression.  2.  Paroxysmal atrial fibrillation: Multaq was ineffective. Now on Tikosyn.  3.  Hyperlipidemia 4. GERD 5. HTN 6. Asthma 7. Chronic diastolic CHF: Echo (1/61): EF 55-60%, mild LV hypertrophy, no regional WMAs, mild AI, mild MR, mild-moderate biatrial enlargement, PA systolic pressure 51 mmHg. Echo (7/16) with EF 60-65%, moderate diastolic dysfunction, normal RV size and systolic function, mild-moderate MR, moderate TR, moderate biatrial enlargement, PA systolic pressure 65 mmHg.  - Echo (5/18): EF 55-60%, mild AI, mild MR, PASP 60 mmHg, normal RV size and systolic function.  - Echo (8/19): EF 60-65%, normal RV size and systolic function, severe TR with PASP 60 mmHg.  8. CKD 9. CVA: Right parietal cortical CVA with peri-infarct hemorrhagic conversion.  CTA showed right M2 branch flow gap.   10. Prostate cancer: Metastatic.  - Bilateral orchiectomy in 5/19.   SH: Nonsmoker.  Retired Stage manager, lives in [place].  2 daughters.  Originally from [place].   FH: Brother with CABG.   ROS: All systems reviewed and negative except as per HPI.    Current Outpatient Medications  Medication Sig Dispense Refill   beclomethasone (BECONASE-AQ) 42 MCG/SPRAY nasal spray Place 1 spray into both nostrils 2 (two) times daily.     buPROPion (WELLBUTRIN SR) 150 MG 12 hr tablet TAKE 1 TABLET(150 MG) BY MOUTH DAILY 90 tablet 3   cetirizine (ZYRTEC) 10 MG tablet Take 10 mg by mouth daily with supper.       Cholecalciferol (VITAMIN D) 2000 units tablet Take 2,000 Units by mouth daily.     denosumab (PROLIA) 60 MG/ML SOSY injection Inject 60 mg into the skin every 6 (six) months.     dofetilide (TIKOSYN) 250 MCG capsule TAKE 1 CAPSULE(250 MCG) BY MOUTH TWICE DAILY 120 capsule 3   FLUoxetine (PROZAC) 10 MG capsule Take 1 capsule (10 mg total) by mouth daily. 90 capsule 3   Fluticasone Furoate (ARNUITY ELLIPTA) 200 MCG/ACT AEPB Inhale 1 puff into the lungs daily. 180 each 3   Lactase (LACTAID PO) Take 1 tablet by mouth as needed (lactose intolerant).     LORazepam (ATIVAN) 1 MG tablet TAKE 1/2 TO 1 TABLET BY MOUTH AT BEDTIME AS NEEDED 90 tablet 0   mupirocin ointment (BACTROBAN) 2 % Apply 1 Application topically 2 (two) times daily. 22 g 1  nitroGLYCERIN (NITROSTAT) 0.4 MG SL tablet Place 1 tablet (0.4 mg total) under the tongue every 5 (five) minutes x 3 doses as needed for chest pain. 25 tablet 2   omeprazole (PRILOSEC OTC) 20 MG tablet Take 1 tablet (20 mg total) by mouth daily. 100 tablet 4   Polyethyl Glycol-Propyl Glycol (SYSTANE OP) Place 1 drop into both eyes as needed (dry eye).     tamsulosin (FLOMAX) 0.4 MG CAPS capsule TAKE 1 CAPSULE(0.4 MG) BY MOUTH DAILY 90 capsule 3   traMADol (ULTRAM) 50 MG tablet Take 50 mg by mouth as needed (pain).     atorvastatin (LIPITOR) 20 MG tablet Take 1 tablet by mouth once daily 90 tablet 3   No current facility-administered medications for this encounter.    BP 130/70   Pulse 79   Wt 71.3 kg (157 lb 3.2 oz)   SpO2 100%   BMI 22.56 kg/m  General: NAD Neck: No JVD, no thyromegaly or thyroid nodule.  Lungs: Clear to auscultation bilaterally with normal respiratory effort. CV: Nondisplaced PMI.  Heart regular S1/S2, no S3/S4, 1/6 HSM LLSB.  No carotid bruit.  Normal pedal pulses.  Abdomen: Soft, nontender, no hepatosplenomegaly, no distention.  Skin: Intact without lesions or rashes.  Neurologic: Alert and oriented x 3.  Psych: Normal  affect. Extremities: No clubbing or cyanosis. Right lower leg swollen and wrapped.  HEENT: Normal.   Assessment/Plan: 1. CAD: He has chronic stable angina walking up a steep hill (manifests as left shoulder pain).  No change in pattern x 15 years.   - Continue medical management of angina, no indication for stress test or cath.  - He is normally on apixaban without ASA given stable CAD (held with hematoma).   - Continue statin, good lipids in 4/23.   2. Hyperlipidemia: He is on atorvastatin 20 mg daily, good lipids 4/23. 3. Chronic diastolic CHF:  Most recent echo in 8/19 showed EF 32-67% but PA systolic pressure was estimated at 60 mmHg and there was severe TR (progressed from moderate), the RV looked normal.  I suspect that he has a degree of RV failure.  He takes Lasix prn (every 3-4 days depending on ankle edema).  He is not volume overloaded by exam or REDS clip. However, he has worsened dyspnea (moderate exertion) over the last week.  I think that this may be due to anemia in setting of right calf hematoma.  - He has wanted to hold off on repeat echo.  Given age, think this is reasonable as unlikely to change management much.   - He can continue current prn Lasix use.  4. Atrial fibrillation: Paroxysmal.  Episodes are quite symptomatic (fatigue) and often triggered by some form of physical stress, he feels "bad" with atrial fibrillation even when he's rate-controlled. He had frequent breakthrough on Multaq but and is now on Tikosyn.  QTc ok on today's ECG.  He is now off diltiazem CD and atenolol due to low BP and orthostasis.  He had atrial fibrillation during recent hospitalization with PNA, but he is back in NSR today and does not think that he's had atrial fibrillation since 1/23.  - Can take metoprolol 25 mg prn tachypalpitations.  - Continue Tikosyn.  BMET/Mg today. - If he develops more frequent breakthrough afib on Tikosyn, amiodarone would be an option.   - Eliquis on hold for now  with RLE hematoma.  5. Tricuspid regurgitation: Severe on 8/19 echo, he does have a TR murmur.  Not candidate  for surgical TVR and not interested in referral to trial for percutaneous repair.  6. Anemia: Due to right calf spontaneous hematoma.   - Check Fe studies, will give IV Fe as needed.  - CBC today.  Avoid transfusion unless hgb < 7.  7. Right calf hematoma: Spontaneous.  He is off apixaban for the time being.  I will have him come back on Friday, if swelling is coming down we can hopefully restart Eliquis.   Followup Friday for check of right calf hematoma.    Loralie Champagne 09/07/2021

## 2021-09-07 NOTE — Progress Notes (Signed)
ReDS Vest / Clip - 09/07/21 1000       ReDS Vest / Clip   Station Marker C    Ruler Value 27.5    ReDS Value Range Low volume    ReDS Actual Value 24

## 2021-09-08 ENCOUNTER — Ambulatory Visit: Payer: Self-pay | Admitting: *Deleted

## 2021-09-08 ENCOUNTER — Encounter: Payer: Self-pay | Admitting: *Deleted

## 2021-09-08 ENCOUNTER — Other Ambulatory Visit: Payer: Self-pay | Admitting: *Deleted

## 2021-09-08 NOTE — Patient Instructions (Signed)
Visit Information  Thank you for taking time to visit with me today. Please don't hesitate to contact me if I can be of assistance to you before our next scheduled telephone appointment.  Following are the goals we discussed today:  Please call the care guide team at [phone] if you need to cancel or reschedule your appointment.   Please call the [place] National Suicide Prevention Lifeline: 201 304 1187 or TTY: 2290699377 TTY [phone]) to talk to a trained counselor call 1-800-273-TALK (toll free, 24 hour hotline) if you are experiencing a Mental Health or [place] or need someone to talk to.   Patient verbalizes understanding of instructions and care plan provided today and agrees to view in [place]. Active MyChart status and patient understanding of how to access instructions and care plan via MyChart confirmed with patient.     The patient has been provided with contact information for the care management team and has been advised to call with any health related questions or concerns.   Raina Mina, RN Care Management Coordinator [place] Office [phone]

## 2021-09-08 NOTE — Patient Outreach (Signed)
  Care Coordination   Initial Visit Note   09/08/2021 Name: [name] [name], [name] [name] MRN: [id number] DOB: [birth year]  [name] [name], [name] [name] is a 88 y.o. year old male who sees Burchette, Alinda Sierras, [name] [name] for primary care. I spoke with  [name] [name], [name] [name] by phone today  What matters to the patients health and wellness today?  No needs today   Goals Addressed               This Visit's Progress     COMPLETED: Found out the delay with [facility]) (pt-stated)        Care Coordination Interventions: Advised patient to to continue to check with the provider's office on the appointment day tomorrow for updates if available Collaborated with Dr. Elease Hashimoto office representative regarding referral orders with [facility]. Office representative indicated orders were sent on 7/24 to [facility] for wound care. Also stated services would be delayed 2-3 days at that time.  Reviewed scheduled/upcoming provider appointments including pending 09/04/2021 @ 7:00 AM.  RN Case Manager has contacted the local [facility] [phone] and representative indicated pt was not in the system. I was transferred to another representative and LVM with [place].   I have also reach out to the [facility] dept for possible representative that is able to check the system directly for any details on the delay of services. Currently awaiting a cb. Will update team accordingly.        COMPLETED: No call from [facility] for a home visit has occurred (pt-stated)         Care Management   Outreach Note  09/03/2021 Name: [name] [name], [name] [name] MRN: [id number] DOB: 1930-01-24  Successful outreach to [name] [name] [facility]), patient and Dr. Elease Hashimoto office administrator.  Follow Up Plan:  The care management team will reach out to the patient again over the next 3 business days.  Pt has a follow up appointment with PCP 7/28 '@7'$ :00AM. RN informed by [facility] that the pt will be call for the initial home visit. Pt is expecting a call for  these arrangements.  Raina Mina, RN Care Management Coordinator [place] Office [phone]       No needs at this time (pt-stated)         Care Coordination Interventions: Reviewed medications with patient and discussed the purpose of his medications. Reviewed scheduled/upcoming provider appointments including upcoming appointments Screening for signs and symptoms of depression related to chronic disease state  Assessed social determinant of health barriers  Also verified [facility] has completed their initial home visit on Sunday. Pt aware and verified providers involved with his ongoing care with additional needs at this time. Pt very grateful for all the assistance provider.         SDOH assessments and interventions completed:   Yes SDOH Interventions Today    Flowsheet Row Most Recent Value  SDOH Interventions   Food Insecurity Interventions Intervention Not Indicated  Transportation Interventions Intervention Not Indicated       Care Coordination Interventions Activated:  Yes Care Coordination Interventions:  Yes, provided  Follow up plan: No further intervention required.  Encounter Outcome:  Pt. Visit Completed   Raina Mina, RN Care Management Coordinator [place] Office [phone]

## 2021-09-08 NOTE — Patient Outreach (Signed)
[place] [facility]) Care Management  09/08/2021  [name], MD 09/12/1929 [id number]    Care Management   Outreach Note  09/08/2021 Name: [name] BAIR, MD MRN: [id number] DOB: Feb 28, 1929  Unsuccessful outreach scheduled call today.   Follow Up Plan:  A HIPAA compliant phone message was left for the patient providing contact information and requesting a return call.  The care management team will reach out to the patient again over the next few days.   Raina Mina, RN Care Management Coordinator [place] Office [phone]

## 2021-09-10 ENCOUNTER — Encounter: Payer: PPO | Admitting: *Deleted

## 2021-09-11 ENCOUNTER — Telehealth (HOSPITAL_COMMUNITY): Payer: Self-pay | Admitting: Surgery

## 2021-09-11 ENCOUNTER — Other Ambulatory Visit (HOSPITAL_COMMUNITY): Payer: Self-pay | Admitting: Surgery

## 2021-09-11 ENCOUNTER — Other Ambulatory Visit (HOSPITAL_COMMUNITY): Payer: Self-pay

## 2021-09-11 ENCOUNTER — Ambulatory Visit (HOSPITAL_COMMUNITY)
Admission: RE | Admit: 2021-09-11 | Discharge: 2021-09-11 | Disposition: A | Discharge status: Home / Self Care | Payer: PPO | Source: Ambulatory Visit | Attending: Cardiology | Admitting: Cardiology

## 2021-09-11 ENCOUNTER — Encounter (HOSPITAL_COMMUNITY): Payer: Self-pay

## 2021-09-11 VITALS — BP 108/60 | HR 69 | Wt 158.2 lb

## 2021-09-11 DIAGNOSIS — K5730 Diverticulosis of large intestine without perforation or abscess without bleeding: Secondary | ICD-10-CM | POA: Diagnosis not present

## 2021-09-11 DIAGNOSIS — F32A Depression, unspecified: Secondary | ICD-10-CM | POA: Diagnosis not present

## 2021-09-11 DIAGNOSIS — I5032 Chronic diastolic (congestive) heart failure: Secondary | ICD-10-CM | POA: Diagnosis not present

## 2021-09-11 DIAGNOSIS — J45909 Unspecified asthma, uncomplicated: Secondary | ICD-10-CM | POA: Diagnosis not present

## 2021-09-11 DIAGNOSIS — Z955 Presence of coronary angioplasty implant and graft: Secondary | ICD-10-CM | POA: Diagnosis not present

## 2021-09-11 DIAGNOSIS — Z9089 Acquired absence of other organs: Secondary | ICD-10-CM | POA: Diagnosis not present

## 2021-09-11 DIAGNOSIS — F419 Anxiety disorder, unspecified: Secondary | ICD-10-CM | POA: Diagnosis not present

## 2021-09-11 DIAGNOSIS — I951 Orthostatic hypotension: Secondary | ICD-10-CM | POA: Diagnosis not present

## 2021-09-11 DIAGNOSIS — I130 Hypertensive heart and chronic kidney disease with heart failure and stage 1 through stage 4 chronic kidney disease, or unspecified chronic kidney disease: Secondary | ICD-10-CM | POA: Diagnosis not present

## 2021-09-11 DIAGNOSIS — I2510 Atherosclerotic heart disease of native coronary artery without angina pectoris: Secondary | ICD-10-CM | POA: Diagnosis not present

## 2021-09-11 DIAGNOSIS — Z961 Presence of intraocular lens: Secondary | ICD-10-CM | POA: Diagnosis not present

## 2021-09-11 DIAGNOSIS — Z8673 Personal history of transient ischemic attack (TIA), and cerebral infarction without residual deficits: Secondary | ICD-10-CM | POA: Diagnosis not present

## 2021-09-11 DIAGNOSIS — J309 Allergic rhinitis, unspecified: Secondary | ICD-10-CM | POA: Diagnosis not present

## 2021-09-11 DIAGNOSIS — I509 Heart failure, unspecified: Secondary | ICD-10-CM | POA: Diagnosis not present

## 2021-09-11 DIAGNOSIS — E785 Hyperlipidemia, unspecified: Secondary | ICD-10-CM | POA: Diagnosis not present

## 2021-09-11 DIAGNOSIS — M5136 Other intervertebral disc degeneration, lumbar region: Secondary | ICD-10-CM | POA: Diagnosis not present

## 2021-09-11 DIAGNOSIS — Z9841 Cataract extraction status, right eye: Secondary | ICD-10-CM | POA: Diagnosis not present

## 2021-09-11 DIAGNOSIS — N1831 Chronic kidney disease, stage 3a: Secondary | ICD-10-CM | POA: Diagnosis not present

## 2021-09-11 DIAGNOSIS — Z8674 Personal history of sudden cardiac arrest: Secondary | ICD-10-CM | POA: Diagnosis not present

## 2021-09-11 DIAGNOSIS — I480 Paroxysmal atrial fibrillation: Secondary | ICD-10-CM | POA: Diagnosis not present

## 2021-09-11 DIAGNOSIS — Z96653 Presence of artificial knee joint, bilateral: Secondary | ICD-10-CM | POA: Diagnosis not present

## 2021-09-11 DIAGNOSIS — Z7951 Long term (current) use of inhaled steroids: Secondary | ICD-10-CM | POA: Diagnosis not present

## 2021-09-11 DIAGNOSIS — M7981 Nontraumatic hematoma of soft tissue: Secondary | ICD-10-CM | POA: Diagnosis not present

## 2021-09-11 DIAGNOSIS — Z9049 Acquired absence of other specified parts of digestive tract: Secondary | ICD-10-CM | POA: Diagnosis not present

## 2021-09-11 DIAGNOSIS — K219 Gastro-esophageal reflux disease without esophagitis: Secondary | ICD-10-CM | POA: Diagnosis not present

## 2021-09-11 LAB — CBC
HCT: 26.1 % — ABNORMAL LOW (ref 39.0–52.0)
Hemoglobin: 8 g/dL — ABNORMAL LOW (ref 13.0–17.0)
MCH: 29.7 pg (ref 26.0–34.0)
MCHC: 30.7 g/dL (ref 30.0–36.0)
MCV: 97 fL (ref 80.0–100.0)
Platelets: 368 10*3/uL (ref 150–400)
RBC: 2.69 MIL/uL — ABNORMAL LOW (ref 4.22–5.81)
RDW: 14.8 % (ref 11.5–15.5)
WBC: 8.2 10*3/uL (ref 4.0–10.5)
nRBC: 0 % (ref 0.0–0.2)

## 2021-09-11 MED ORDER — APIXABAN 5 MG PO TABS: 5.0000 mg | ORAL_TABLET | Freq: Two times a day (BID) | ORAL | 3 refills | Status: DC

## 2021-09-11 NOTE — Telephone Encounter (Signed)
[name] [name] returned my call is now aware of August 14th appt to have Feraheme infusion.  He is aware and agreeable to plan.

## 2021-09-11 NOTE — Telephone Encounter (Signed)
I called [facility] and scheduled patient for Feraheme Infusion on August 14th (first available) at [facility].  I also attempted to reach patient to confirm with him the instructions, where and time to arrive.  I was unable to reach him and left a message for return call.  Someone from the [facility] will attempt to reach him at another time as well.

## 2021-09-11 NOTE — Telephone Encounter (Signed)
I called patient to review results and recommendations per provider.  He will return next week on Thursday for repeat CBC.  Feraheme infusion scheduled August 14th in [facility]. Medlist updated in [facility].

## 2021-09-11 NOTE — Telephone Encounter (Signed)
-----   Message from Larey Dresser, MD sent at 09/11/2021  4:00 PM EDT ----- Hgb stable at 8.  Needs feraheme infusion set up.  I think he can restart Eliquis at prior dose on Monday.  Will need CBC again on Thursday of next week.

## 2021-09-11 NOTE — Progress Notes (Signed)
I called [facility] to inquire about IV Iron infusion as requested by Dr. Aundra Dubin.  The representative informed me that PA is not required for Feraheme infusion and that the patient will be responsible for 20% Co-Insurance amount.  [facility] requires that patient be established with provider in order to receive infusions at that clinic.  I have left a message with [facility] and [facility] can schedule patient for August 14th. Clinic nurse will proceed to enter orders for IV Iron per Dr. Aundra Dubin and she and I will collaborate as to where patient is scheduled.

## 2021-09-15 DIAGNOSIS — Z85828 Personal history of other malignant neoplasm of skin: Secondary | ICD-10-CM | POA: Diagnosis not present

## 2021-09-15 DIAGNOSIS — C44329 Squamous cell carcinoma of skin of other parts of face: Secondary | ICD-10-CM | POA: Diagnosis not present

## 2021-09-15 DIAGNOSIS — S8011XA Contusion of right lower leg, initial encounter: Secondary | ICD-10-CM | POA: Diagnosis not present

## 2021-09-17 DIAGNOSIS — S8011XA Contusion of right lower leg, initial encounter: Secondary | ICD-10-CM | POA: Diagnosis not present

## 2021-09-18 DIAGNOSIS — S8011XA Contusion of right lower leg, initial encounter: Secondary | ICD-10-CM | POA: Diagnosis not present

## 2021-09-21 ENCOUNTER — Ambulatory Visit (HOSPITAL_COMMUNITY)
Admission: RE | Admit: 2021-09-21 | Discharge: 2021-09-21 | Disposition: A | Discharge status: Home / Self Care | Payer: PPO | Source: Ambulatory Visit | Attending: Cardiology | Admitting: Cardiology

## 2021-09-21 DIAGNOSIS — D500 Iron deficiency anemia secondary to blood loss (chronic): Secondary | ICD-10-CM | POA: Insufficient documentation

## 2021-09-21 MED ORDER — SODIUM CHLORIDE 0.9 % IV SOLN
510.0000 mg | Freq: Once | INTRAVENOUS | Status: AC
  Administered 2021-09-21: 510 mg via INTRAVENOUS
  Filled 2021-09-21: qty 510

## 2021-09-21 MED ORDER — FERUMOXYTOL INJECTION 510 MG/17 ML: 510.0000 mg | Freq: Once | INTRAVENOUS | 0 refills | Status: DC

## 2021-09-24 ENCOUNTER — Ambulatory Visit (HOSPITAL_COMMUNITY)
Admission: RE | Admit: 2021-09-24 | Discharge: 2021-09-24 | Disposition: A | Discharge status: Home / Self Care | Payer: PPO | Source: Ambulatory Visit | Attending: Internal Medicine | Admitting: Internal Medicine

## 2021-09-24 DIAGNOSIS — S8011XA Contusion of right lower leg, initial encounter: Secondary | ICD-10-CM | POA: Diagnosis not present

## 2021-09-24 DIAGNOSIS — I5032 Chronic diastolic (congestive) heart failure: Secondary | ICD-10-CM | POA: Diagnosis not present

## 2021-09-24 LAB — CBC
HCT: 29.5 % — ABNORMAL LOW (ref 39.0–52.0)
Hemoglobin: 9 g/dL — ABNORMAL LOW (ref 13.0–17.0)
MCH: 28.4 pg (ref 26.0–34.0)
MCHC: 30.5 g/dL (ref 30.0–36.0)
MCV: 93.1 fL (ref 80.0–100.0)
Platelets: 315 10*3/uL (ref 150–400)
RBC: 3.17 MIL/uL — ABNORMAL LOW (ref 4.22–5.81)
RDW: 15.1 % (ref 11.5–15.5)
WBC: 8.6 10*3/uL (ref 4.0–10.5)
nRBC: 0 % (ref 0.0–0.2)

## 2021-09-30 ENCOUNTER — Other Ambulatory Visit: Payer: Self-pay | Admitting: Family Medicine

## 2021-09-30 MED ORDER — LORAZEPAM 1 MG PO TABS: 0.5000 mg | ORAL_TABLET | Freq: Every evening | ORAL | 1 refills | Status: DC | PRN

## 2021-10-03 ENCOUNTER — Other Ambulatory Visit: Payer: Self-pay | Admitting: Family Medicine

## 2021-10-05 DIAGNOSIS — L0889 Other specified local infections of the skin and subcutaneous tissue: Secondary | ICD-10-CM | POA: Diagnosis not present

## 2021-10-05 DIAGNOSIS — L0291 Cutaneous abscess, unspecified: Secondary | ICD-10-CM | POA: Diagnosis not present

## 2021-10-07 DIAGNOSIS — I739 Peripheral vascular disease, unspecified: Secondary | ICD-10-CM | POA: Diagnosis not present

## 2021-10-07 DIAGNOSIS — L97929 Non-pressure chronic ulcer of unspecified part of left lower leg with unspecified severity: Secondary | ICD-10-CM | POA: Diagnosis not present

## 2021-10-07 DIAGNOSIS — M2142 Flat foot [pes planus] (acquired), left foot: Secondary | ICD-10-CM | POA: Diagnosis not present

## 2021-10-07 DIAGNOSIS — M2042 Other hammer toe(s) (acquired), left foot: Secondary | ICD-10-CM | POA: Diagnosis not present

## 2021-10-07 DIAGNOSIS — M7981 Nontraumatic hematoma of soft tissue: Secondary | ICD-10-CM | POA: Diagnosis not present

## 2021-10-08 DIAGNOSIS — L97929 Non-pressure chronic ulcer of unspecified part of left lower leg with unspecified severity: Secondary | ICD-10-CM | POA: Diagnosis not present

## 2021-10-08 DIAGNOSIS — I739 Peripheral vascular disease, unspecified: Secondary | ICD-10-CM | POA: Diagnosis not present

## 2021-10-09 DIAGNOSIS — J309 Allergic rhinitis, unspecified: Secondary | ICD-10-CM | POA: Diagnosis not present

## 2021-10-09 DIAGNOSIS — I509 Heart failure, unspecified: Secondary | ICD-10-CM | POA: Diagnosis not present

## 2021-10-09 DIAGNOSIS — N1831 Chronic kidney disease, stage 3a: Secondary | ICD-10-CM | POA: Diagnosis not present

## 2021-10-09 DIAGNOSIS — I2510 Atherosclerotic heart disease of native coronary artery without angina pectoris: Secondary | ICD-10-CM | POA: Diagnosis not present

## 2021-10-09 DIAGNOSIS — K219 Gastro-esophageal reflux disease without esophagitis: Secondary | ICD-10-CM | POA: Diagnosis not present

## 2021-10-09 DIAGNOSIS — I951 Orthostatic hypotension: Secondary | ICD-10-CM | POA: Diagnosis not present

## 2021-10-09 DIAGNOSIS — M5136 Other intervertebral disc degeneration, lumbar region: Secondary | ICD-10-CM | POA: Diagnosis not present

## 2021-10-09 DIAGNOSIS — J45909 Unspecified asthma, uncomplicated: Secondary | ICD-10-CM | POA: Diagnosis not present

## 2021-10-09 DIAGNOSIS — F32A Depression, unspecified: Secondary | ICD-10-CM | POA: Diagnosis not present

## 2021-10-09 DIAGNOSIS — I480 Paroxysmal atrial fibrillation: Secondary | ICD-10-CM | POA: Diagnosis not present

## 2021-10-09 DIAGNOSIS — Z961 Presence of intraocular lens: Secondary | ICD-10-CM | POA: Diagnosis not present

## 2021-10-09 DIAGNOSIS — Z8674 Personal history of sudden cardiac arrest: Secondary | ICD-10-CM | POA: Diagnosis not present

## 2021-10-09 DIAGNOSIS — F419 Anxiety disorder, unspecified: Secondary | ICD-10-CM | POA: Diagnosis not present

## 2021-10-09 DIAGNOSIS — Z9841 Cataract extraction status, right eye: Secondary | ICD-10-CM | POA: Diagnosis not present

## 2021-10-09 DIAGNOSIS — Z955 Presence of coronary angioplasty implant and graft: Secondary | ICD-10-CM | POA: Diagnosis not present

## 2021-10-09 DIAGNOSIS — K5730 Diverticulosis of large intestine without perforation or abscess without bleeding: Secondary | ICD-10-CM | POA: Diagnosis not present

## 2021-10-09 DIAGNOSIS — I130 Hypertensive heart and chronic kidney disease with heart failure and stage 1 through stage 4 chronic kidney disease, or unspecified chronic kidney disease: Secondary | ICD-10-CM | POA: Diagnosis not present

## 2021-10-09 DIAGNOSIS — Z8673 Personal history of transient ischemic attack (TIA), and cerebral infarction without residual deficits: Secondary | ICD-10-CM | POA: Diagnosis not present

## 2021-10-09 DIAGNOSIS — E785 Hyperlipidemia, unspecified: Secondary | ICD-10-CM | POA: Diagnosis not present

## 2021-10-09 DIAGNOSIS — Z7951 Long term (current) use of inhaled steroids: Secondary | ICD-10-CM | POA: Diagnosis not present

## 2021-10-09 DIAGNOSIS — Z9049 Acquired absence of other specified parts of digestive tract: Secondary | ICD-10-CM | POA: Diagnosis not present

## 2021-10-09 DIAGNOSIS — Z9089 Acquired absence of other organs: Secondary | ICD-10-CM | POA: Diagnosis not present

## 2021-10-09 DIAGNOSIS — M7981 Nontraumatic hematoma of soft tissue: Secondary | ICD-10-CM | POA: Diagnosis not present

## 2021-10-09 DIAGNOSIS — Z96653 Presence of artificial knee joint, bilateral: Secondary | ICD-10-CM | POA: Diagnosis not present

## 2021-10-16 DIAGNOSIS — F32A Depression, unspecified: Secondary | ICD-10-CM | POA: Diagnosis not present

## 2021-10-16 DIAGNOSIS — E785 Hyperlipidemia, unspecified: Secondary | ICD-10-CM | POA: Diagnosis not present

## 2021-10-16 DIAGNOSIS — Z7951 Long term (current) use of inhaled steroids: Secondary | ICD-10-CM | POA: Diagnosis not present

## 2021-10-16 DIAGNOSIS — M7981 Nontraumatic hematoma of soft tissue: Secondary | ICD-10-CM | POA: Diagnosis not present

## 2021-10-16 DIAGNOSIS — Z8674 Personal history of sudden cardiac arrest: Secondary | ICD-10-CM | POA: Diagnosis not present

## 2021-10-16 DIAGNOSIS — Z961 Presence of intraocular lens: Secondary | ICD-10-CM | POA: Diagnosis not present

## 2021-10-16 DIAGNOSIS — Z96653 Presence of artificial knee joint, bilateral: Secondary | ICD-10-CM | POA: Diagnosis not present

## 2021-10-16 DIAGNOSIS — Z955 Presence of coronary angioplasty implant and graft: Secondary | ICD-10-CM | POA: Diagnosis not present

## 2021-10-16 DIAGNOSIS — K219 Gastro-esophageal reflux disease without esophagitis: Secondary | ICD-10-CM | POA: Diagnosis not present

## 2021-10-16 DIAGNOSIS — I130 Hypertensive heart and chronic kidney disease with heart failure and stage 1 through stage 4 chronic kidney disease, or unspecified chronic kidney disease: Secondary | ICD-10-CM | POA: Diagnosis not present

## 2021-10-16 DIAGNOSIS — I951 Orthostatic hypotension: Secondary | ICD-10-CM | POA: Diagnosis not present

## 2021-10-16 DIAGNOSIS — J45909 Unspecified asthma, uncomplicated: Secondary | ICD-10-CM | POA: Diagnosis not present

## 2021-10-16 DIAGNOSIS — S8011XA Contusion of right lower leg, initial encounter: Secondary | ICD-10-CM | POA: Diagnosis not present

## 2021-10-16 DIAGNOSIS — Z9049 Acquired absence of other specified parts of digestive tract: Secondary | ICD-10-CM | POA: Diagnosis not present

## 2021-10-16 DIAGNOSIS — Z8673 Personal history of transient ischemic attack (TIA), and cerebral infarction without residual deficits: Secondary | ICD-10-CM | POA: Diagnosis not present

## 2021-10-16 DIAGNOSIS — M5136 Other intervertebral disc degeneration, lumbar region: Secondary | ICD-10-CM | POA: Diagnosis not present

## 2021-10-16 DIAGNOSIS — Z9089 Acquired absence of other organs: Secondary | ICD-10-CM | POA: Diagnosis not present

## 2021-10-16 DIAGNOSIS — Z9841 Cataract extraction status, right eye: Secondary | ICD-10-CM | POA: Diagnosis not present

## 2021-10-16 DIAGNOSIS — I480 Paroxysmal atrial fibrillation: Secondary | ICD-10-CM | POA: Diagnosis not present

## 2021-10-16 DIAGNOSIS — I509 Heart failure, unspecified: Secondary | ICD-10-CM | POA: Diagnosis not present

## 2021-10-16 DIAGNOSIS — N1831 Chronic kidney disease, stage 3a: Secondary | ICD-10-CM | POA: Diagnosis not present

## 2021-10-16 DIAGNOSIS — F419 Anxiety disorder, unspecified: Secondary | ICD-10-CM | POA: Diagnosis not present

## 2021-10-16 DIAGNOSIS — K5730 Diverticulosis of large intestine without perforation or abscess without bleeding: Secondary | ICD-10-CM | POA: Diagnosis not present

## 2021-10-16 DIAGNOSIS — I2510 Atherosclerotic heart disease of native coronary artery without angina pectoris: Secondary | ICD-10-CM | POA: Diagnosis not present

## 2021-10-16 DIAGNOSIS — J309 Allergic rhinitis, unspecified: Secondary | ICD-10-CM | POA: Diagnosis not present

## 2021-10-20 ENCOUNTER — Ambulatory Visit: Payer: PPO | Attending: Internal Medicine | Admitting: Internal Medicine

## 2021-10-20 ENCOUNTER — Encounter: Payer: Self-pay | Admitting: Internal Medicine

## 2021-10-20 VITALS — BP 121/65 | HR 70 | Ht 70.0 in | Wt 149.0 lb

## 2021-10-20 DIAGNOSIS — I480 Paroxysmal atrial fibrillation: Secondary | ICD-10-CM

## 2021-10-20 DIAGNOSIS — I2510 Atherosclerotic heart disease of native coronary artery without angina pectoris: Secondary | ICD-10-CM | POA: Diagnosis not present

## 2021-10-20 NOTE — Progress Notes (Signed)
HPI Dr. Caryn Section returns today for evaluation of PAF. 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 He developed atrial fib and has been treated with dofetitilide.  He has done well in the interim. He denies palpitations, chest pain, or syncope. Minimal symptomatic atrial fib. No chest pain.  Allergies  Allergen Reactions   Tylenol [Acetaminophen] Rash   Azithromycin Other (See Comments)    Causes Afib   Advil [Ibuprofen] Nausea Only and Rash   Asa [Aspirin] Rash     Current Outpatient Medications  Medication Sig Dispense Refill   apixaban (ELIQUIS) 5 MG TABS tablet Take 1 tablet (5 mg total) by mouth 2 (two) times daily. 60 tablet 3   atorvastatin (LIPITOR) 20 MG tablet Take 1 tablet by mouth once daily 90 tablet 3   beclomethasone (BECONASE-AQ) 42 MCG/SPRAY nasal spray Place 1 spray into both nostrils 2 (two) times daily.     buPROPion (WELLBUTRIN SR) 150 MG 12 hr tablet TAKE 1 TABLET(150 MG) BY MOUTH DAILY 90 tablet 3   cetirizine (ZYRTEC) 10 MG tablet Take 10 mg by mouth daily with supper.      Cholecalciferol (VITAMIN D) 2000 units tablet Take 2,000 Units by mouth daily.     denosumab (PROLIA) 60 MG/ML SOSY injection Inject 60 mg into the skin every 6 (six) months.     dofetilide (TIKOSYN) 250 MCG capsule TAKE 1 CAPSULE(250 MCG) BY MOUTH TWICE DAILY 120 capsule 3   FLUoxetine (PROZAC) 10 MG capsule Take 1 capsule (10 mg total) by mouth daily. 90 capsule 3   Fluticasone Furoate (ARNUITY ELLIPTA) 200 MCG/ACT AEPB Inhale 1 puff into the lungs daily. 180 each 3   Lactase (LACTAID PO) Take 1 tablet by mouth as needed (lactose intolerant).     LORazepam (ATIVAN) 1 MG tablet Take 0.5-1 tablets (0.5-1 mg total) by mouth at bedtime as needed. 90 tablet 1   mupirocin ointment (BACTROBAN) 2 % Apply 1 Application topically 2 (two) times daily. 22 g 1   nitroGLYCERIN (NITROSTAT) 0.4 MG SL tablet Place 1 tablet (0.4 mg total) under the tongue every 5 (five) minutes x 3 doses  as needed for chest pain. 25 tablet 2   omeprazole (PRILOSEC OTC) 20 MG tablet Take 1 tablet (20 mg total) by mouth daily. 100 tablet 4   Polyethyl Glycol-Propyl Glycol (SYSTANE OP) Place 1 drop into both eyes as needed (dry eye).     tamsulosin (FLOMAX) 0.4 MG CAPS capsule TAKE 1 CAPSULE(0.4 MG) BY MOUTH DAILY 90 capsule 3   traMADol (ULTRAM) 50 MG tablet Take 50 mg by mouth as needed (pain).     ferumoxytol (FERAHEME) 510 MG/17ML SOLN injection Inject 17 mLs (510 mg total) into the vein once for 1 dose. 17 mL 0   No current facility-administered medications for this visit.     Past Medical History:  Diagnosis Date   Acute hemorrhagic infarction of brain [facility])    Allergic rhinitis    Anxiety    Asthma    Atrial fibrillation ([place])    CAD (coronary artery disease)    CHF (congestive heart failure) ([place])    Depression    Diverticulosis 03/16/1995   DJD (degenerative joint disease)    "most joints" (03/11/2015)   GERD (gastroesophageal reflux disease) 12/30/2000   Heart attack ([place]) 1984   Hiatal hernia 12/30/2000   History of blood transfusion 1988   "related to GI bleeding OR"   History of duodenal ulcer  10/15/1986   Hyperlipidemia    Hypertension    ICH (intracerebral hemorrhage) ([place])    Sepsis ([place]) 12/2018   Sinus bradycardia    Stroke ([place])     ROS:   All systems reviewed and negative except as noted in the HPI.   Past Surgical History:  Procedure Laterality Date   CARDIAC CATHETERIZATION     CATARACT EXTRACTION W/ INTRAOCULAR LENS  IMPLANT, BILATERAL Bilateral ?2013   [place] WITH STENT PLACEMENT  2010   hospital ccu  1984   heart attack, hypoplastic right coronary   INGUINAL HERNIA REPAIR Right ~ [place] Bilateral 06/10/2017   Procedure: BILATERAL ORCHIECTOMY;  Surgeon: Franchot Gallo, MD;  Location: [facility];  Service: Urology;  Laterality: Bilateral;  MAC ANESTHESIA AND LOCAL    TONSILLECTOMY     TOTAL KNEE ARTHROPLASTY Bilateral 1998-2004   "right-left"   VAGOTOMY AND PYLOROPLASTY  1988   "bleeding duodenal ulcer"     Family History  Problem Relation Age of Onset   Coronary artery disease Mother        deceased   Deep vein thrombosis Sister    Coronary artery disease Brother    Asthma Brother    Colon cancer Neg Hx      Social History   Socioeconomic History   Marital status: Married    Spouse name: Not on file   Number of children: 2   Years of education: Not on file   Highest education level: Doctorate  Occupational History   Occupation: IT sales professional: RETIRED    Comment: radiologist  Tobacco Use   Smoking status: Never   Smokeless tobacco: Never  Vaping Use   Vaping Use: Never used  Substance and Sexual Activity   Alcohol use: No   Drug use: No   Sexual activity: Not Currently  Other Topics Concern   Not on file  Social History Narrative   Married. Regular exercise - yes.       03/26/2019: Married to Therapist, sports, lives in 3 story home, although lives on first level mostly. Has two grown children.      Enjoys golfing; drives to [place] with wife every winter for a few days, watches sports, following Merchandiser, retail, reads about WWII,    Social Determinants of Health   Financial Resource Strain: Low Risk  (04/15/2021)   Overall Financial Resource Strain (CARDIA)    Difficulty of Paying Living Expenses: Not hard at all  Food Insecurity: No Food Insecurity (09/08/2021)   Hunger Vital Sign    Worried About Running Out of Food in the Last Year: Never true    [place] in the Last Year: Never true  Transportation Needs: No Transportation Needs (09/08/2021)   PRAPARE - Hydrologist (Medical): No    Lack of Transportation (Non-Medical): No  Physical Activity: Insufficiently Active (04/15/2021)   Exercise Vital Sign    Days of Exercise per Week: 2 days    Minutes of Exercise per Session: 40 min  Stress: No Stress  Concern Present (04/15/2021)   [place]    Feeling of Stress : Not at all  Social Connections: [place] (04/15/2021)   Social Connection and Isolation Panel [NHANES]    Frequency of Communication with Friends and Family: More than three times a week    Frequency of  Social Gatherings with Friends and Family: More than three times a week    Attends Religious Services: More than 4 times per year    Active Member of [employer] or Organizations: Yes    Attends Music therapist: More than 4 times per year    Marital Status: Married  Human resources officer Violence: Not At Risk (04/15/2021)   Humiliation, Afraid, Rape, and Kick questionnaire    Fear of Current or Ex-Partner: No    Emotionally Abused: No    Physically Abused: No    Sexually Abused: No     BP 121/65   Pulse 70   Ht _0  (1.778 m)   Wt 149 lb (67.6 kg)   SpO2 95%   BMI 21.38 kg/m   Physical Exam:  Well appearing NAD HEENT: Unremarkable Neck:  No JVD, no thyromegally Lymphatics:  No adenopathy Back:  No CVA tenderness Lungs:  Clear HEART:  Regular rate rhythm, no murmurs, no rubs, no clicks Abd:  soft, positive bowel sounds, no organomegally, no rebound, no guarding Ext:  2 plus pulses, no edema, no cyanosis, no clubbing Skin:  No rashes no nodules Neuro:  CN II through XII intact, motor grossly intact  EKG - nsr  DEVICE  Normal device function.  See PaceArt for details.   Assess/Plan:   1. PAF - he appears to be maintaining NSR. He will continue dofetilide for now.  2. CAD - he denies any anginal symptoms and we will follow. 3. Chronic diastolic heart failure - he is class 2. He will maintain a low sodium diet. 4. Dyslipidemia - he will continue statin therapy.   Carleene Overlie Kammie Scioli,MD

## 2021-10-20 NOTE — Patient Instructions (Signed)

## 2021-11-02 DIAGNOSIS — M2042 Other hammer toe(s) (acquired), left foot: Secondary | ICD-10-CM | POA: Diagnosis not present

## 2021-11-02 DIAGNOSIS — M7981 Nontraumatic hematoma of soft tissue: Secondary | ICD-10-CM | POA: Diagnosis not present

## 2021-11-02 DIAGNOSIS — M2142 Flat foot [pes planus] (acquired), left foot: Secondary | ICD-10-CM | POA: Diagnosis not present

## 2021-11-02 DIAGNOSIS — I739 Peripheral vascular disease, unspecified: Secondary | ICD-10-CM | POA: Diagnosis not present

## 2021-11-02 DIAGNOSIS — L97929 Non-pressure chronic ulcer of unspecified part of left lower leg with unspecified severity: Secondary | ICD-10-CM | POA: Diagnosis not present

## 2021-11-04 DIAGNOSIS — C61 Malignant neoplasm of prostate: Secondary | ICD-10-CM | POA: Diagnosis not present

## 2021-11-06 DIAGNOSIS — Z7951 Long term (current) use of inhaled steroids: Secondary | ICD-10-CM | POA: Diagnosis not present

## 2021-11-06 DIAGNOSIS — N1831 Chronic kidney disease, stage 3a: Secondary | ICD-10-CM | POA: Diagnosis not present

## 2021-11-06 DIAGNOSIS — I480 Paroxysmal atrial fibrillation: Secondary | ICD-10-CM | POA: Diagnosis not present

## 2021-11-06 DIAGNOSIS — M7981 Nontraumatic hematoma of soft tissue: Secondary | ICD-10-CM | POA: Diagnosis not present

## 2021-11-06 DIAGNOSIS — J309 Allergic rhinitis, unspecified: Secondary | ICD-10-CM | POA: Diagnosis not present

## 2021-11-06 DIAGNOSIS — Z8673 Personal history of transient ischemic attack (TIA), and cerebral infarction without residual deficits: Secondary | ICD-10-CM | POA: Diagnosis not present

## 2021-11-06 DIAGNOSIS — Z4800 Encounter for change or removal of nonsurgical wound dressing: Secondary | ICD-10-CM | POA: Diagnosis not present

## 2021-11-06 DIAGNOSIS — I2510 Atherosclerotic heart disease of native coronary artery without angina pectoris: Secondary | ICD-10-CM | POA: Diagnosis not present

## 2021-11-06 DIAGNOSIS — I509 Heart failure, unspecified: Secondary | ICD-10-CM | POA: Diagnosis not present

## 2021-11-06 DIAGNOSIS — F32A Depression, unspecified: Secondary | ICD-10-CM | POA: Diagnosis not present

## 2021-11-06 DIAGNOSIS — D631 Anemia in chronic kidney disease: Secondary | ICD-10-CM | POA: Diagnosis not present

## 2021-11-06 DIAGNOSIS — J45909 Unspecified asthma, uncomplicated: Secondary | ICD-10-CM | POA: Diagnosis not present

## 2021-11-06 DIAGNOSIS — Z7901 Long term (current) use of anticoagulants: Secondary | ICD-10-CM | POA: Diagnosis not present

## 2021-11-06 DIAGNOSIS — Z9089 Acquired absence of other organs: Secondary | ICD-10-CM | POA: Diagnosis not present

## 2021-11-06 DIAGNOSIS — Z8674 Personal history of sudden cardiac arrest: Secondary | ICD-10-CM | POA: Diagnosis not present

## 2021-11-06 DIAGNOSIS — I951 Orthostatic hypotension: Secondary | ICD-10-CM | POA: Diagnosis not present

## 2021-11-06 DIAGNOSIS — Z9049 Acquired absence of other specified parts of digestive tract: Secondary | ICD-10-CM | POA: Diagnosis not present

## 2021-11-06 DIAGNOSIS — I130 Hypertensive heart and chronic kidney disease with heart failure and stage 1 through stage 4 chronic kidney disease, or unspecified chronic kidney disease: Secondary | ICD-10-CM | POA: Diagnosis not present

## 2021-11-06 DIAGNOSIS — M5136 Other intervertebral disc degeneration, lumbar region: Secondary | ICD-10-CM | POA: Diagnosis not present

## 2021-11-06 DIAGNOSIS — F419 Anxiety disorder, unspecified: Secondary | ICD-10-CM | POA: Diagnosis not present

## 2021-11-06 DIAGNOSIS — Z955 Presence of coronary angioplasty implant and graft: Secondary | ICD-10-CM | POA: Diagnosis not present

## 2021-11-06 DIAGNOSIS — K5730 Diverticulosis of large intestine without perforation or abscess without bleeding: Secondary | ICD-10-CM | POA: Diagnosis not present

## 2021-11-06 DIAGNOSIS — E785 Hyperlipidemia, unspecified: Secondary | ICD-10-CM | POA: Diagnosis not present

## 2021-11-06 DIAGNOSIS — K219 Gastro-esophageal reflux disease without esophagitis: Secondary | ICD-10-CM | POA: Diagnosis not present

## 2021-11-09 ENCOUNTER — Encounter (HOSPITAL_COMMUNITY): Payer: Self-pay | Admitting: Cardiology

## 2021-11-09 ENCOUNTER — Ambulatory Visit (HOSPITAL_COMMUNITY)
Admission: RE | Admit: 2021-11-09 | Discharge: 2021-11-09 | Disposition: A | Discharge status: Home / Self Care | Payer: PPO | Source: Ambulatory Visit | Attending: Cardiology | Admitting: Cardiology

## 2021-11-09 VITALS — BP 104/60 | HR 64 | Wt 147.0 lb

## 2021-11-09 DIAGNOSIS — I2510 Atherosclerotic heart disease of native coronary artery without angina pectoris: Secondary | ICD-10-CM | POA: Diagnosis not present

## 2021-11-09 DIAGNOSIS — I5032 Chronic diastolic (congestive) heart failure: Secondary | ICD-10-CM | POA: Diagnosis not present

## 2021-11-09 DIAGNOSIS — Z7901 Long term (current) use of anticoagulants: Secondary | ICD-10-CM | POA: Insufficient documentation

## 2021-11-09 DIAGNOSIS — N1831 Chronic kidney disease, stage 3a: Secondary | ICD-10-CM | POA: Diagnosis not present

## 2021-11-09 DIAGNOSIS — I130 Hypertensive heart and chronic kidney disease with heart failure and stage 1 through stage 4 chronic kidney disease, or unspecified chronic kidney disease: Secondary | ICD-10-CM | POA: Diagnosis not present

## 2021-11-09 DIAGNOSIS — R011 Cardiac murmur, unspecified: Secondary | ICD-10-CM | POA: Diagnosis not present

## 2021-11-09 DIAGNOSIS — E785 Hyperlipidemia, unspecified: Secondary | ICD-10-CM | POA: Diagnosis not present

## 2021-11-09 DIAGNOSIS — Z8674 Personal history of sudden cardiac arrest: Secondary | ICD-10-CM | POA: Diagnosis not present

## 2021-11-09 DIAGNOSIS — Z8673 Personal history of transient ischemic attack (TIA), and cerebral infarction without residual deficits: Secondary | ICD-10-CM | POA: Diagnosis not present

## 2021-11-09 DIAGNOSIS — J309 Allergic rhinitis, unspecified: Secondary | ICD-10-CM | POA: Diagnosis not present

## 2021-11-09 DIAGNOSIS — I25119 Atherosclerotic heart disease of native coronary artery with unspecified angina pectoris: Secondary | ICD-10-CM | POA: Diagnosis not present

## 2021-11-09 DIAGNOSIS — D649 Anemia, unspecified: Secondary | ICD-10-CM | POA: Insufficient documentation

## 2021-11-09 DIAGNOSIS — I480 Paroxysmal atrial fibrillation: Secondary | ICD-10-CM | POA: Diagnosis not present

## 2021-11-09 DIAGNOSIS — J45909 Unspecified asthma, uncomplicated: Secondary | ICD-10-CM | POA: Diagnosis not present

## 2021-11-09 DIAGNOSIS — M7981 Nontraumatic hematoma of soft tissue: Secondary | ICD-10-CM | POA: Diagnosis not present

## 2021-11-09 DIAGNOSIS — I509 Heart failure, unspecified: Secondary | ICD-10-CM | POA: Diagnosis not present

## 2021-11-09 DIAGNOSIS — K5730 Diverticulosis of large intestine without perforation or abscess without bleeding: Secondary | ICD-10-CM | POA: Diagnosis not present

## 2021-11-09 DIAGNOSIS — F419 Anxiety disorder, unspecified: Secondary | ICD-10-CM | POA: Diagnosis not present

## 2021-11-09 DIAGNOSIS — N189 Chronic kidney disease, unspecified: Secondary | ICD-10-CM | POA: Insufficient documentation

## 2021-11-09 DIAGNOSIS — R0602 Shortness of breath: Secondary | ICD-10-CM | POA: Diagnosis present

## 2021-11-09 DIAGNOSIS — I951 Orthostatic hypotension: Secondary | ICD-10-CM | POA: Diagnosis not present

## 2021-11-09 DIAGNOSIS — Z79899 Other long term (current) drug therapy: Secondary | ICD-10-CM | POA: Insufficient documentation

## 2021-11-09 DIAGNOSIS — Z4800 Encounter for change or removal of nonsurgical wound dressing: Secondary | ICD-10-CM | POA: Diagnosis not present

## 2021-11-09 DIAGNOSIS — Z955 Presence of coronary angioplasty implant and graft: Secondary | ICD-10-CM | POA: Diagnosis not present

## 2021-11-09 DIAGNOSIS — M5136 Other intervertebral disc degeneration, lumbar region: Secondary | ICD-10-CM | POA: Diagnosis not present

## 2021-11-09 DIAGNOSIS — Z9089 Acquired absence of other organs: Secondary | ICD-10-CM | POA: Diagnosis not present

## 2021-11-09 DIAGNOSIS — F32A Depression, unspecified: Secondary | ICD-10-CM | POA: Diagnosis not present

## 2021-11-09 DIAGNOSIS — K219 Gastro-esophageal reflux disease without esophagitis: Secondary | ICD-10-CM | POA: Diagnosis not present

## 2021-11-09 DIAGNOSIS — D631 Anemia in chronic kidney disease: Secondary | ICD-10-CM | POA: Diagnosis not present

## 2021-11-09 DIAGNOSIS — Z7951 Long term (current) use of inhaled steroids: Secondary | ICD-10-CM | POA: Diagnosis not present

## 2021-11-09 DIAGNOSIS — Z9049 Acquired absence of other specified parts of digestive tract: Secondary | ICD-10-CM | POA: Diagnosis not present

## 2021-11-09 LAB — CBC
HCT: 35.5 % — ABNORMAL LOW (ref 39.0–52.0)
Hemoglobin: 11.4 g/dL — ABNORMAL LOW (ref 13.0–17.0)
MCH: 29.4 pg (ref 26.0–34.0)
MCHC: 32.1 g/dL (ref 30.0–36.0)
MCV: 91.5 fL (ref 80.0–100.0)
Platelets: 291 10*3/uL (ref 150–400)
RBC: 3.88 MIL/uL — ABNORMAL LOW (ref 4.22–5.81)
RDW: 15.7 % — ABNORMAL HIGH (ref 11.5–15.5)
WBC: 8.7 10*3/uL (ref 4.0–10.5)
nRBC: 0 % (ref 0.0–0.2)

## 2021-11-09 LAB — IRON AND TIBC
Iron: 49 ug/dL (ref 45–182)
Saturation Ratios: 17 % — ABNORMAL LOW (ref 17.9–39.5)
TIBC: 281 ug/dL (ref 250–450)
UIBC: 232 ug/dL

## 2021-11-09 LAB — FERRITIN: Ferritin: 57 ng/mL (ref 24–336)

## 2021-11-09 NOTE — Patient Instructions (Signed)
There has been no changes to your medications.  Labs done today, your results will be available in MyChart, we will contact you for abnormal readings.  Your physician recommends that you schedule a follow-up appointment in: 3 months ( January 2024) ** please call the office in 3 weeks to arrange your follow up appointment **  If you have any questions or concerns before your next appointment please send [place] a message through [place] or call our office at [phone].    TO LEAVE A MESSAGE FOR THE NURSE SELECT OPTION 2, PLEASE LEAVE A MESSAGE INCLUDING: YOUR NAME DATE OF BIRTH CALL BACK NUMBER REASON FOR CALL**this is important as we prioritize the call backs  YOU WILL RECEIVE A CALL BACK THE SAME DAY AS LONG AS YOU CALL BEFORE 4:00 PM  At the [facility], you and your health needs are our priority. As part of our continuing mission to provide you with exceptional heart care, we have created designated Provider Care Teams. These Care Teams include your primary Cardiologist (physician) and Advanced Practice Providers (APPs- Physician Assistants and Nurse Practitioners) who all work together to provide you with the care you need, when you need it.   You may see any of the following providers on your designated Care Team at your next follow up: Dr Glori Bickers Dr Loralie Champagne Dr. Roxana Hires, NP Lyda Jester, [place] [facility], [place] Forestine Na, NP Audry Riles, PharmD   Please be sure to bring in all your medications bottles to every appointment.

## 2021-11-09 NOTE — Progress Notes (Signed)
Patient ID: [name] Spence, MD, male   DOB: 11-01-1929, 88 y.o.   MRN: [id number] PCP: [name], MD Cardiology: Dr. 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  He had an RV infarct with chronic total occlusion of a small nondominant RCA in the 1980s.  He had a drug-eluting stent to the CFX in 11/10. He is very symptomatic with atrial fibrillation episodes. He has had a chronic pattern of mild shortness of breath and mild left shoulder pain/chest aching when walking up a hill (x 20 years).   He can walk 3 miles on flat ground without problems.  He golfs 3-4 times a week.  He walks for 20-30 minutes on a treadmill several times a week.   Echo in 7/16 showed normal LV systolic function, EF 27-25% with moderate diastolic dysfunction.  Moderate TR with PA systolic pressure 65 mmHg.  Normal RV size and systolic function.  Given volume overload on exam and abnormal echo, he was started on low dose Lasix in the past.    He was started on Multaq but continued to have episodes of atrial fibrillation.  This was stopped, and he was admitted for Tikosyn loading.  After he went home, he developed left facial droop and left arm weakness.  He was found to have a small right parietal cortical infarct with hemorrhagic conversion.  His symptoms resolved quickly. INR was subtherapeutic at admission.  Warfarin was stopped and he was put on apixaban.   In 9/17, he was admitted with LLL PNA.  During the admission, he had a couple of relatively short runs of atrial fibrillation.  He developed more frequent episodes of atrial fibrillation at home and was started on Tikosyn.   In 5/19, he had bilateral orchiectomy for metastatic prostate cancer.  His PSA has come down considerably.   In 1/20, he had influenza that progressed to PNA.  He was hospitalized with transient atrial fibrillation.    In 11/20, he was  admitted with an E coli UTI and urosepsis with hypotension and AKI.  He was initially fluid-resuscitated and later required diuresis.  He was in atrial fibrillation while in the hospital.   Patient was admitted in 1/23 with influenza A as well as Strep pneumoniae PNA.  He was treated with oseltamivir and antibiotics. While in the hospital, he went into atrial fibrillation with controlled rate.    Patient returns for followup of atrial fibrillation.  Right calf ulcer that developed after hematoma is slowing improving.  He is back on apixaban with no further bleeding. No exertional dyspnea or chest pain.  Poor stamina. He has not been using Lasix. Weight is down 10 lbs. No has not felt any palpitations (generally feels when he is in atrial fibrillation).   ECG (personally reviewed): NSR, inferior T wave flattening, QTc 471 msec   Labs (6/11): LDL 63, HDL 35, K 5.5, creatinine 1.1 Labs (6/12): LDL 58, HDL 53, TSH normal, K 4.7, creatinine 1 Labs (12/12): BNP 79 Labs (7/14): K 5, creatinine 1.1, LDL 48, HDL 34 Labs (8/15): LDL 48, HDL 33, TSH normal, K 5.4, creatinine 1.2 Labs (6/16): K 5.1, creatinine 1.27, HCT 42.5 Labs (10/16): LDL 37, HDL 32 Labs (11/16): K 4.4, creatinine 1.36, BNP 103 Labs  (2/17): K 4.5, creatinine 1.38, Mg 2.0, LDL 32 Labs (3/17): HCT 41.7 Labs (5/17): K 4.2, creatinine 1.17, Mg 2.1 Labs (9/17): K  4.2, creatinine 1.16, HCT 37.3 Labs (11/17): hgb 13.3, LDL 40 Labs (5/18): K 4.5, creatinine 1.25 Labs (5/19): K 5.5, creatinine 1.16 Labs (2/20): K 4, creatinine 1.24, hgb 12.2 Labs (11/20): K 3.4, creatinine 1.34, hgb 10.3, LDL 49 Labs (12/20): K 4.5, creatinine 1.2 Labs (2/21): K 3.9, creatinine 1.5, TSH 4.5 (mildly elevated) Labs (5/21): TSH normal, K 5, creatinine 1.27 Labs (9/21): K 4.1, creatinine 1.22, LDL 40, TSH normal Labs (4/22): K 4.2, creatinine 1.11 Labs (8/22): hgb 12, LDL 36 Labs (10/22): K 4.5, creatinine 1.38 Labs (1/23): K 4.6, creatinine 1.15, hgb  11.4 Labs (4/23): LDL 37 Labs (7/23): hgb 7.8, K 4.5, creatinine 1.14 Labs (8/23): hgb 9  PMH: 1. CAD: RV infarct in the 1980s.  Chronic total occlusion of a small nondominant RCA.  11/10 PCI to CFX with Xience DES.  EF 60% on LV-gram at that time. ETT (11/14) with 9'07" exercise, no significant ST depression.  2.  Paroxysmal atrial fibrillation: Multaq was ineffective. Now on Tikosyn.  3.  Hyperlipidemia 4. GERD 5. HTN 6. Asthma 7. Chronic diastolic CHF: Echo (6/14): EF 55-60%, mild LV hypertrophy, no regional WMAs, mild AI, mild MR, mild-moderate biatrial enlargement, PA systolic pressure 51 mmHg. Echo (7/16) with EF 60-65%, moderate diastolic dysfunction, normal RV size and systolic function, mild-moderate MR, moderate TR, moderate biatrial enlargement, PA systolic pressure 65 mmHg.  - Echo (5/18): EF 55-60%, mild AI, mild MR, PASP 60 mmHg, normal RV size and systolic function.  - Echo (8/19): EF 60-65%, normal RV size and systolic function, severe TR with PASP 60 mmHg.  8. CKD 9. CVA: Right parietal cortical CVA with peri-infarct hemorrhagic conversion.  CTA showed right M2 branch flow gap.   10. Prostate cancer: Metastatic.  - Bilateral orchiectomy in 5/19.   SH: Nonsmoker.  Retired Stage manager, lives in [place].  2 daughters.  Originally from [place].   FH: Brother with CABG.   ROS: All systems reviewed and negative except as per HPI.    Current Outpatient Medications  Medication Sig Dispense Refill   apixaban (ELIQUIS) 5 MG TABS tablet Take 1 tablet (5 mg total) by mouth 2 (two) times daily. 60 tablet 3   atorvastatin (LIPITOR) 20 MG tablet Take 1 tablet by mouth once daily 90 tablet 3   beclomethasone (BECONASE-AQ) 42 MCG/SPRAY nasal spray Place 1 spray into both nostrils 2 (two) times daily.     buPROPion (WELLBUTRIN SR) 150 MG 12 hr tablet TAKE 1 TABLET(150 MG) BY MOUTH DAILY 90 tablet 3   cetirizine (ZYRTEC) 10 MG tablet Take 10 mg by mouth daily with supper.       Cholecalciferol (VITAMIN D) 2000 units tablet Take 2,000 Units by mouth daily.     denosumab (PROLIA) 60 MG/ML SOSY injection Inject 60 mg into the skin every 6 (six) months.     dofetilide (TIKOSYN) 250 MCG capsule TAKE 1 CAPSULE(250 MCG) BY MOUTH TWICE DAILY 120 capsule 3   FLUoxetine (PROZAC) 10 MG capsule Take 1 capsule (10 mg total) by mouth daily. 90 capsule 3   Fluticasone Furoate (ARNUITY ELLIPTA) 200 MCG/ACT AEPB Inhale 1 puff into the lungs daily. 180 each 3   Lactase (LACTAID PO) Take 1 tablet by mouth as needed (lactose intolerant).     LORazepam (ATIVAN) 1 MG tablet Take 0.5-1 tablets (0.5-1 mg total) by mouth at bedtime as needed. 90 tablet 1   nitroGLYCERIN (NITROSTAT) 0.4 MG SL tablet Place 1 tablet (0.4 mg total) under the tongue every 5 (five)  minutes x 3 doses as needed for chest pain. 25 tablet 2   omeprazole (PRILOSEC OTC) 20 MG tablet Take 1 tablet (20 mg total) by mouth daily. 100 tablet 4   Polyethyl Glycol-Propyl Glycol (SYSTANE OP) Place 1 drop into both eyes as needed (dry eye).     tamsulosin (FLOMAX) 0.4 MG CAPS capsule TAKE 1 CAPSULE(0.4 MG) BY MOUTH DAILY 90 capsule 3   traMADol (ULTRAM) 50 MG tablet Take 50 mg by mouth as needed (pain).     No current facility-administered medications for this encounter.    BP 104/60   Pulse 64   Wt 66.7 kg (147 lb)   SpO2 98%   BMI 21.09 kg/m  General: NAD Neck: No JVD, no thyromegaly or thyroid nodule.  Lungs: Clear to auscultation bilaterally with normal respiratory effort. CV: Nondisplaced PMI.  Heart regular S1/S2, no S3/S4, 1/6 HSM LLSB.  Trace ankle edema.  No carotid bruit.  Normal pedal pulses.  Abdomen: Soft, nontender, no hepatosplenomegaly, no distention.  Skin: Intact without lesions or rashes.  Neurologic: Alert and oriented x 3.  Psych: Normal affect. Extremities: No clubbing or cyanosis. Ulceration on right lower leg is healing.  HEENT: Normal.   Assessment/Plan: 1. CAD: He has chronic stable angina  walking up a steep hill (manifests as left shoulder pain).  No change in pattern x 15 years.   - Continue medical management of angina, no indication for stress test or cath.  - He is on apixaban without ASA given stable CAD.   - Continue statin, good lipids in 4/23.   2. Hyperlipidemia: He is on atorvastatin 20 mg daily, good lipids 4/23. 3. Chronic diastolic CHF:  Most recent echo in 8/19 showed EF 17-00% but PA systolic pressure was estimated at 60 mmHg and there was severe TR (progressed from moderate), the RV looked normal.  I suspect that he has a degree of RV failure.  He takes Lasix prn, no recent use.  He is not volume overloaded by exam. NYHA class II.   - He has wanted to hold off on repeat echo.  Given age, think this is reasonable as unlikely to change management much.   - He can continue current prn Lasix use.  4. Atrial fibrillation: Paroxysmal.  Episodes are quite symptomatic (fatigue) and often triggered by some form of physical stress, he feels "bad" with atrial fibrillation even when he's rate-controlled. He had frequent breakthrough on Multaq but and is now on Tikosyn.  QTc ok on today's ECG.  He is now off diltiazem CD and atenolol due to low BP and orthostasis.  He is in NSR today, no recent AF symptoms.  - Can take metoprolol 25 mg prn tachypalpitations.  - Continue Tikosyn, QTc acceptable on today's ECG.  BMET/Mg today. - If he develops more frequent breakthrough afib on Tikosyn, amiodarone would be an option.   - Continue Eliquis.  5. Tricuspid regurgitation: Severe on 8/19 echo, he does have a TR murmur.  Not candidate for surgical TVR and not interested in referral to trial for percutaneous repair.  6. Anemia: Due to right calf spontaneous hematoma.   - Check Fe studies, will give IV Fe as needed.  - CBC today.     Followup in 3 months.    Loralie Champagne 11/09/2021

## 2021-11-11 DIAGNOSIS — S8011XA Contusion of right lower leg, initial encounter: Secondary | ICD-10-CM | POA: Diagnosis not present

## 2021-11-11 DIAGNOSIS — C61 Malignant neoplasm of prostate: Secondary | ICD-10-CM | POA: Diagnosis not present

## 2021-11-11 DIAGNOSIS — N400 Enlarged prostate without lower urinary tract symptoms: Secondary | ICD-10-CM | POA: Diagnosis not present

## 2021-11-20 DIAGNOSIS — L97929 Non-pressure chronic ulcer of unspecified part of left lower leg with unspecified severity: Secondary | ICD-10-CM | POA: Diagnosis not present

## 2021-11-20 DIAGNOSIS — I739 Peripheral vascular disease, unspecified: Secondary | ICD-10-CM | POA: Diagnosis not present

## 2021-11-20 DIAGNOSIS — M2042 Other hammer toe(s) (acquired), left foot: Secondary | ICD-10-CM | POA: Diagnosis not present

## 2021-11-20 DIAGNOSIS — M2142 Flat foot [pes planus] (acquired), left foot: Secondary | ICD-10-CM | POA: Diagnosis not present

## 2021-11-20 DIAGNOSIS — M7981 Nontraumatic hematoma of soft tissue: Secondary | ICD-10-CM | POA: Diagnosis not present

## 2021-11-30 DIAGNOSIS — L97929 Non-pressure chronic ulcer of unspecified part of left lower leg with unspecified severity: Secondary | ICD-10-CM | POA: Diagnosis not present

## 2021-11-30 DIAGNOSIS — M2042 Other hammer toe(s) (acquired), left foot: Secondary | ICD-10-CM | POA: Diagnosis not present

## 2021-11-30 DIAGNOSIS — M7981 Nontraumatic hematoma of soft tissue: Secondary | ICD-10-CM | POA: Diagnosis not present

## 2021-11-30 DIAGNOSIS — I739 Peripheral vascular disease, unspecified: Secondary | ICD-10-CM | POA: Diagnosis not present

## 2021-11-30 DIAGNOSIS — M2142 Flat foot [pes planus] (acquired), left foot: Secondary | ICD-10-CM | POA: Diagnosis not present

## 2021-12-01 ENCOUNTER — Telehealth: Payer: Self-pay

## 2021-12-01 NOTE — Telephone Encounter (Signed)
No recent CPE noted. Pt notified of this & appt made for 12/16/21

## 2021-12-07 DIAGNOSIS — M2142 Flat foot [pes planus] (acquired), left foot: Secondary | ICD-10-CM | POA: Diagnosis not present

## 2021-12-07 DIAGNOSIS — I739 Peripheral vascular disease, unspecified: Secondary | ICD-10-CM | POA: Diagnosis not present

## 2021-12-07 DIAGNOSIS — L97929 Non-pressure chronic ulcer of unspecified part of left lower leg with unspecified severity: Secondary | ICD-10-CM | POA: Diagnosis not present

## 2021-12-07 DIAGNOSIS — M2042 Other hammer toe(s) (acquired), left foot: Secondary | ICD-10-CM | POA: Diagnosis not present

## 2021-12-07 DIAGNOSIS — M7981 Nontraumatic hematoma of soft tissue: Secondary | ICD-10-CM | POA: Diagnosis not present

## 2021-12-10 DIAGNOSIS — Z955 Presence of coronary angioplasty implant and graft: Secondary | ICD-10-CM | POA: Diagnosis not present

## 2021-12-10 DIAGNOSIS — I2510 Atherosclerotic heart disease of native coronary artery without angina pectoris: Secondary | ICD-10-CM | POA: Diagnosis not present

## 2021-12-10 DIAGNOSIS — F419 Anxiety disorder, unspecified: Secondary | ICD-10-CM | POA: Diagnosis not present

## 2021-12-10 DIAGNOSIS — J45909 Unspecified asthma, uncomplicated: Secondary | ICD-10-CM | POA: Diagnosis not present

## 2021-12-10 DIAGNOSIS — I509 Heart failure, unspecified: Secondary | ICD-10-CM | POA: Diagnosis not present

## 2021-12-10 DIAGNOSIS — M5136 Other intervertebral disc degeneration, lumbar region: Secondary | ICD-10-CM | POA: Diagnosis not present

## 2021-12-10 DIAGNOSIS — I130 Hypertensive heart and chronic kidney disease with heart failure and stage 1 through stage 4 chronic kidney disease, or unspecified chronic kidney disease: Secondary | ICD-10-CM | POA: Diagnosis not present

## 2021-12-10 DIAGNOSIS — I480 Paroxysmal atrial fibrillation: Secondary | ICD-10-CM | POA: Diagnosis not present

## 2021-12-10 DIAGNOSIS — K219 Gastro-esophageal reflux disease without esophagitis: Secondary | ICD-10-CM | POA: Diagnosis not present

## 2021-12-10 DIAGNOSIS — D631 Anemia in chronic kidney disease: Secondary | ICD-10-CM | POA: Diagnosis not present

## 2021-12-10 DIAGNOSIS — Z8673 Personal history of transient ischemic attack (TIA), and cerebral infarction without residual deficits: Secondary | ICD-10-CM | POA: Diagnosis not present

## 2021-12-10 DIAGNOSIS — J309 Allergic rhinitis, unspecified: Secondary | ICD-10-CM | POA: Diagnosis not present

## 2021-12-10 DIAGNOSIS — I951 Orthostatic hypotension: Secondary | ICD-10-CM | POA: Diagnosis not present

## 2021-12-10 DIAGNOSIS — K5730 Diverticulosis of large intestine without perforation or abscess without bleeding: Secondary | ICD-10-CM | POA: Diagnosis not present

## 2021-12-10 DIAGNOSIS — Z8674 Personal history of sudden cardiac arrest: Secondary | ICD-10-CM | POA: Diagnosis not present

## 2021-12-10 DIAGNOSIS — Z9049 Acquired absence of other specified parts of digestive tract: Secondary | ICD-10-CM | POA: Diagnosis not present

## 2021-12-10 DIAGNOSIS — Z7951 Long term (current) use of inhaled steroids: Secondary | ICD-10-CM | POA: Diagnosis not present

## 2021-12-10 DIAGNOSIS — Z9089 Acquired absence of other organs: Secondary | ICD-10-CM | POA: Diagnosis not present

## 2021-12-10 DIAGNOSIS — Z4800 Encounter for change or removal of nonsurgical wound dressing: Secondary | ICD-10-CM | POA: Diagnosis not present

## 2021-12-10 DIAGNOSIS — M7981 Nontraumatic hematoma of soft tissue: Secondary | ICD-10-CM | POA: Diagnosis not present

## 2021-12-10 DIAGNOSIS — F32A Depression, unspecified: Secondary | ICD-10-CM | POA: Diagnosis not present

## 2021-12-10 DIAGNOSIS — E785 Hyperlipidemia, unspecified: Secondary | ICD-10-CM | POA: Diagnosis not present

## 2021-12-10 DIAGNOSIS — N1831 Chronic kidney disease, stage 3a: Secondary | ICD-10-CM | POA: Diagnosis not present

## 2021-12-10 DIAGNOSIS — Z7901 Long term (current) use of anticoagulants: Secondary | ICD-10-CM | POA: Diagnosis not present

## 2021-12-11 DIAGNOSIS — S8011XA Contusion of right lower leg, initial encounter: Secondary | ICD-10-CM | POA: Diagnosis not present

## 2021-12-16 ENCOUNTER — Encounter: Payer: Self-pay | Admitting: Family Medicine

## 2021-12-16 ENCOUNTER — Ambulatory Visit (INDEPENDENT_AMBULATORY_CARE_PROVIDER_SITE_OTHER): Payer: PPO | Admitting: Family Medicine

## 2021-12-16 VITALS — BP 140/58 | HR 66 | Temp 97.4°F | Ht 69.75 in | Wt 148.0 lb

## 2021-12-16 DIAGNOSIS — Z0000 Encounter for general adult medical examination without abnormal findings: Secondary | ICD-10-CM

## 2021-12-16 MED ORDER — TRAMADOL HCL 50 MG PO TABS: 50.0000 mg | ORAL_TABLET | Freq: Four times a day (QID) | ORAL | 1 refills | Status: DC | PRN

## 2021-12-16 NOTE — Progress Notes (Signed)
Established Patient Office Visit  Subjective   Patient ID: [name] Spence, MD, male    DOB: June 01, 1929  Age: 88 y.o. MRN: [id number]  Chief Complaint  Patient presents with   Annual Exam   Wound Check    Patient states the wound on the RLE is being treated by Dr Burnadette Pop (podiatrist)    HPI   [name] is seen for well visit.  He has past medical history significant for hypertension, CAD, atrial fibrillation, diastolic heart failure, GERD, hyperlipidemia, stage III chronic kidney disease.  He has spontaneous hemorrhage right lower extremity last summer with large hematoma and has had complicated wound with slow healing since then.  Currently being managed by podiatrist from [place].  He has had extensive home health monitoring as well.  This is almost closed and hopefully will be in the next few weeks.  He is generally doing well.  Has resumed some golfing.  Some mild walking.  He had significant blood loss anemia following hematoma and most recent hemoglobin is back up to 11.4.  He is taking some iron supplementation.  Ferritin levels had improved.  Last atrial fibrillation episode was last year.  He remains on Tikosyn as well as Eliquis  Health maintenance reviewed  -Flu vaccine already given -Other vaccines including pneumonia vaccines, Shingrix, RSV already completed -Last tetanus 2019 -Aged out of colonoscopy screening  Family history and social history reviewed with no significant changes.  Younger brother died age 26 of CAD.  2 sister still alive age is 41 and 87.  Past Medical History:  Diagnosis Date   Acute hemorrhagic infarction of brain [name])    Allergic rhinitis    Anxiety    Asthma    Atrial fibrillation ([name])    CAD (coronary artery disease)    CHF (congestive heart failure) ([place])    Depression    Diverticulosis 03/16/1995   DJD (degenerative joint disease)    "most joints" (03/11/2015)   GERD (gastroesophageal reflux disease) 12/30/2000   Heart attack ([name])  1984   Hiatal hernia 12/30/2000   History of blood transfusion 1988   "related to GI bleeding OR"   History of duodenal ulcer 10/15/1986   Hyperlipidemia    Hypertension    ICH (intracerebral hemorrhage) ([name])    Sepsis ([name]) 12/2018   Sinus bradycardia    Stroke [name])    Past Surgical History:  Procedure Laterality Date   CARDIAC CATHETERIZATION     CATARACT EXTRACTION W/ INTRAOCULAR LENS  IMPLANT, BILATERAL Bilateral ?2013   [place] WITH STENT PLACEMENT  2010   hospital ccu  1984   heart attack, hypoplastic right coronary   INGUINAL HERNIA REPAIR Right ~ [name] Bilateral 06/10/2017   Procedure: BILATERAL ORCHIECTOMY;  Surgeon: [name] Gallo, MD;  Location: [name];  Service: Urology;  Laterality: Bilateral;  MAC ANESTHESIA AND LOCAL   TONSILLECTOMY     TOTAL KNEE ARTHROPLASTY Bilateral 1998-2004   "right-left"   VAGOTOMY AND PYLOROPLASTY  1988   "bleeding duodenal ulcer"    reports that he has never smoked. He has never used smokeless tobacco. He reports that he does not drink alcohol and does not use drugs. family history includes Asthma in his brother; Coronary artery disease in his brother and mother; Deep vein thrombosis in his sister. Allergies  Allergen Reactions   Tylenol [Acetaminophen] Rash   Azithromycin Other (See Comments)  Causes Afib   Advil [Ibuprofen] Nausea Only and Rash   Asa [Aspirin] Rash    Review of Systems  Constitutional:  Negative for chills, fever and malaise/fatigue.  Eyes:  Negative for blurred vision.  Respiratory:  Negative for shortness of breath.   Cardiovascular:  Negative for chest pain.  Gastrointestinal:  Negative for abdominal pain.  Genitourinary:  Negative for dysuria.  Neurological:  Negative for dizziness, weakness and headaches.      Objective:     BP (!) 140/58 (BP Location: Right Arm, Patient Position: Sitting, Cuff Size: Normal)    Pulse 66   Temp (!) 97.4 F (36.3 C) (Oral)   Ht 5' 9.75" (1.772 m)   Wt 148 lb (67.1 kg)   SpO2 99%   BMI 21.39 kg/m    Physical Exam Vitals reviewed.  Constitutional:      Appearance: He is well-developed.  HENT:     Right Ear: External ear normal.     Left Ear: External ear normal.  Eyes:     Pupils: Pupils are equal, round, and reactive to light.  Neck:     Thyroid: No thyromegaly.  Cardiovascular:     Rate and Rhythm: Normal rate and regular rhythm.  Pulmonary:     Effort: Pulmonary effort is normal. No respiratory distress.     Breath sounds: Normal breath sounds. No wheezing or rales.  Abdominal:     Palpations: Abdomen is soft.  Genitourinary:    Comments: Does have nontender bulge left inguinal region consistent with hernia Musculoskeletal:     Cervical back: Neck supple.     Right lower leg: No edema.     Left lower leg: No edema.     Comments: He has bandage in place right lower extremity-covering mid leg region.  Neurological:     Mental Status: He is alert and oriented to person, place, and time.      No results found for any visits on 12/16/21.    The ASCVD Risk score ([name], et al., 2019) failed to calculate for the following reasons:   The 2019 ASCVD risk score is only valid for ages 39 to 66    Assessment & Plan:   Physical exam.  Multiple chronic medical problems as above.  Overall stable currently.  We discussed the following items  -Vaccines including flu vaccine up-to-date -Not due for any labs this time.  Is had multiple labs recently including CBC and chemistries which have been stable.  Lipids were last April and stable -Discussed advanced directives.  He has a living will in place.  He has medical power of attorney in place. -He wishes to be full code at this point.  We did discuss forms such as the MOST form with copy given  No follow-ups on file.    Carolann Littler, MD

## 2021-12-17 DIAGNOSIS — M7981 Nontraumatic hematoma of soft tissue: Secondary | ICD-10-CM | POA: Diagnosis not present

## 2021-12-17 DIAGNOSIS — I739 Peripheral vascular disease, unspecified: Secondary | ICD-10-CM | POA: Diagnosis not present

## 2021-12-17 DIAGNOSIS — M2042 Other hammer toe(s) (acquired), left foot: Secondary | ICD-10-CM | POA: Diagnosis not present

## 2021-12-17 DIAGNOSIS — M2142 Flat foot [pes planus] (acquired), left foot: Secondary | ICD-10-CM | POA: Diagnosis not present

## 2021-12-17 DIAGNOSIS — L97929 Non-pressure chronic ulcer of unspecified part of left lower leg with unspecified severity: Secondary | ICD-10-CM | POA: Diagnosis not present

## 2021-12-23 ENCOUNTER — Telehealth: Payer: Self-pay | Admitting: Internal Medicine

## 2021-12-23 MED ORDER — DOXYCYCLINE HYCLATE 100 MG PO TABS: 100.0000 mg | ORAL_TABLET | Freq: Two times a day (BID) | ORAL | 0 refills | Status: DC

## 2021-12-23 MED ORDER — PREDNISONE 10 MG PO TABS: ORAL_TABLET | ORAL | 0 refills | Status: AC

## 2021-12-23 NOTE — Telephone Encounter (Signed)
Meds have been sent to pharmacy for pt. Attempted to call pt to let him know this had been done but unable to reach. Left a detailed message for pt letting him know this had been done. Nothing further needed.

## 2021-12-23 NOTE — Telephone Encounter (Signed)
[name] Spence, MD  - Dr Caryn Section rmeialed me having asthmatic bronchtisi with cough, wheez and eyllow sputum  Plan  - please have him test for covid at home 0 if positive call for anti viral - also Please take prednisone 40 mg x1 day, then 30 mg x1 day, then 20 mg x1 day, then 10 mg x1 day, and then 5 mg x1 day and stop And  Take doxycycline '100mg'$  po twice daily x 5 days; take after meals and avoid sunlight    Allergies  Allergen Reactions   Tylenol [Acetaminophen] Rash   Azithromycin Other (See Comments)    Causes Afib   Advil [Ibuprofen] Nausea Only and Rash   Asa [Aspirin] Rash

## 2021-12-29 DIAGNOSIS — L97929 Non-pressure chronic ulcer of unspecified part of left lower leg with unspecified severity: Secondary | ICD-10-CM | POA: Diagnosis not present

## 2021-12-29 DIAGNOSIS — M2142 Flat foot [pes planus] (acquired), left foot: Secondary | ICD-10-CM | POA: Diagnosis not present

## 2021-12-29 DIAGNOSIS — M2042 Other hammer toe(s) (acquired), left foot: Secondary | ICD-10-CM | POA: Diagnosis not present

## 2021-12-29 DIAGNOSIS — I739 Peripheral vascular disease, unspecified: Secondary | ICD-10-CM | POA: Diagnosis not present

## 2021-12-29 DIAGNOSIS — M7981 Nontraumatic hematoma of soft tissue: Secondary | ICD-10-CM | POA: Diagnosis not present

## 2022-01-01 ENCOUNTER — Other Ambulatory Visit: Payer: Self-pay | Admitting: Family Medicine

## 2022-01-04 ENCOUNTER — Other Ambulatory Visit: Payer: Self-pay | Admitting: Family Medicine

## 2022-01-06 DIAGNOSIS — S8011XA Contusion of right lower leg, initial encounter: Secondary | ICD-10-CM | POA: Diagnosis not present

## 2022-01-08 ENCOUNTER — Other Ambulatory Visit: Payer: Self-pay | Admitting: Family Medicine

## 2022-01-08 DIAGNOSIS — M2142 Flat foot [pes planus] (acquired), left foot: Secondary | ICD-10-CM | POA: Diagnosis not present

## 2022-01-08 DIAGNOSIS — M6281 Muscle weakness (generalized): Secondary | ICD-10-CM | POA: Diagnosis not present

## 2022-01-08 DIAGNOSIS — M76822 Posterior tibial tendinitis, left leg: Secondary | ICD-10-CM | POA: Diagnosis not present

## 2022-01-08 DIAGNOSIS — L97929 Non-pressure chronic ulcer of unspecified part of left lower leg with unspecified severity: Secondary | ICD-10-CM | POA: Diagnosis not present

## 2022-01-08 DIAGNOSIS — M24872 Other specific joint derangements of left ankle, not elsewhere classified: Secondary | ICD-10-CM | POA: Diagnosis not present

## 2022-01-08 DIAGNOSIS — M7981 Nontraumatic hematoma of soft tissue: Secondary | ICD-10-CM | POA: Diagnosis not present

## 2022-01-15 DIAGNOSIS — Z955 Presence of coronary angioplasty implant and graft: Secondary | ICD-10-CM | POA: Diagnosis not present

## 2022-01-15 DIAGNOSIS — N1831 Chronic kidney disease, stage 3a: Secondary | ICD-10-CM | POA: Diagnosis not present

## 2022-01-15 DIAGNOSIS — Z8674 Personal history of sudden cardiac arrest: Secondary | ICD-10-CM | POA: Diagnosis not present

## 2022-01-15 DIAGNOSIS — I2510 Atherosclerotic heart disease of native coronary artery without angina pectoris: Secondary | ICD-10-CM | POA: Diagnosis not present

## 2022-01-15 DIAGNOSIS — E785 Hyperlipidemia, unspecified: Secondary | ICD-10-CM | POA: Diagnosis not present

## 2022-01-15 DIAGNOSIS — M5136 Other intervertebral disc degeneration, lumbar region: Secondary | ICD-10-CM | POA: Diagnosis not present

## 2022-01-15 DIAGNOSIS — J45909 Unspecified asthma, uncomplicated: Secondary | ICD-10-CM | POA: Diagnosis not present

## 2022-01-15 DIAGNOSIS — I480 Paroxysmal atrial fibrillation: Secondary | ICD-10-CM | POA: Diagnosis not present

## 2022-01-15 DIAGNOSIS — Z7951 Long term (current) use of inhaled steroids: Secondary | ICD-10-CM | POA: Diagnosis not present

## 2022-01-15 DIAGNOSIS — K219 Gastro-esophageal reflux disease without esophagitis: Secondary | ICD-10-CM | POA: Diagnosis not present

## 2022-01-15 DIAGNOSIS — F32A Depression, unspecified: Secondary | ICD-10-CM | POA: Diagnosis not present

## 2022-01-15 DIAGNOSIS — Z7901 Long term (current) use of anticoagulants: Secondary | ICD-10-CM | POA: Diagnosis not present

## 2022-01-15 DIAGNOSIS — I951 Orthostatic hypotension: Secondary | ICD-10-CM | POA: Diagnosis not present

## 2022-01-15 DIAGNOSIS — I509 Heart failure, unspecified: Secondary | ICD-10-CM | POA: Diagnosis not present

## 2022-01-15 DIAGNOSIS — Z9049 Acquired absence of other specified parts of digestive tract: Secondary | ICD-10-CM | POA: Diagnosis not present

## 2022-01-15 DIAGNOSIS — Z4800 Encounter for change or removal of nonsurgical wound dressing: Secondary | ICD-10-CM | POA: Diagnosis not present

## 2022-01-15 DIAGNOSIS — Z8673 Personal history of transient ischemic attack (TIA), and cerebral infarction without residual deficits: Secondary | ICD-10-CM | POA: Diagnosis not present

## 2022-01-15 DIAGNOSIS — I130 Hypertensive heart and chronic kidney disease with heart failure and stage 1 through stage 4 chronic kidney disease, or unspecified chronic kidney disease: Secondary | ICD-10-CM | POA: Diagnosis not present

## 2022-01-15 DIAGNOSIS — D631 Anemia in chronic kidney disease: Secondary | ICD-10-CM | POA: Diagnosis not present

## 2022-01-15 DIAGNOSIS — M7981 Nontraumatic hematoma of soft tissue: Secondary | ICD-10-CM | POA: Diagnosis not present

## 2022-01-15 DIAGNOSIS — J309 Allergic rhinitis, unspecified: Secondary | ICD-10-CM | POA: Diagnosis not present

## 2022-01-15 DIAGNOSIS — Z9089 Acquired absence of other organs: Secondary | ICD-10-CM | POA: Diagnosis not present

## 2022-01-15 DIAGNOSIS — F419 Anxiety disorder, unspecified: Secondary | ICD-10-CM | POA: Diagnosis not present

## 2022-01-15 DIAGNOSIS — K5730 Diverticulosis of large intestine without perforation or abscess without bleeding: Secondary | ICD-10-CM | POA: Diagnosis not present

## 2022-01-25 DIAGNOSIS — M818 Other osteoporosis without current pathological fracture: Secondary | ICD-10-CM | POA: Diagnosis not present

## 2022-01-25 DIAGNOSIS — S8011XA Contusion of right lower leg, initial encounter: Secondary | ICD-10-CM | POA: Diagnosis not present

## 2022-01-25 DIAGNOSIS — C61 Malignant neoplasm of prostate: Secondary | ICD-10-CM | POA: Diagnosis not present

## 2022-02-02 ENCOUNTER — Other Ambulatory Visit: Payer: Self-pay | Admitting: Family Medicine

## 2022-02-04 DIAGNOSIS — H52202 Unspecified astigmatism, left eye: Secondary | ICD-10-CM | POA: Diagnosis not present

## 2022-02-04 DIAGNOSIS — H0100B Unspecified blepharitis left eye, upper and lower eyelids: Secondary | ICD-10-CM | POA: Diagnosis not present

## 2022-02-04 DIAGNOSIS — H0100A Unspecified blepharitis right eye, upper and lower eyelids: Secondary | ICD-10-CM | POA: Diagnosis not present

## 2022-02-22 ENCOUNTER — Encounter (HOSPITAL_COMMUNITY): Payer: Self-pay | Admitting: Cardiology

## 2022-02-22 ENCOUNTER — Ambulatory Visit (HOSPITAL_COMMUNITY)
Admission: RE | Admit: 2022-02-22 | Discharge: 2022-02-22 | Disposition: A | Discharge status: Home / Self Care | Payer: PPO | Source: Ambulatory Visit | Attending: Cardiology | Admitting: Cardiology

## 2022-02-22 VITALS — BP 140/70 | HR 68 | Wt 147.6 lb

## 2022-02-22 DIAGNOSIS — Z79899 Other long term (current) drug therapy: Secondary | ICD-10-CM | POA: Diagnosis not present

## 2022-02-22 DIAGNOSIS — I130 Hypertensive heart and chronic kidney disease with heart failure and stage 1 through stage 4 chronic kidney disease, or unspecified chronic kidney disease: Secondary | ICD-10-CM | POA: Diagnosis not present

## 2022-02-22 DIAGNOSIS — I5032 Chronic diastolic (congestive) heart failure: Secondary | ICD-10-CM

## 2022-02-22 DIAGNOSIS — Z955 Presence of coronary angioplasty implant and graft: Secondary | ICD-10-CM | POA: Insufficient documentation

## 2022-02-22 DIAGNOSIS — J45909 Unspecified asthma, uncomplicated: Secondary | ICD-10-CM | POA: Insufficient documentation

## 2022-02-22 DIAGNOSIS — R0602 Shortness of breath: Secondary | ICD-10-CM | POA: Insufficient documentation

## 2022-02-22 DIAGNOSIS — I25119 Atherosclerotic heart disease of native coronary artery with unspecified angina pectoris: Secondary | ICD-10-CM | POA: Diagnosis not present

## 2022-02-22 DIAGNOSIS — M25512 Pain in left shoulder: Secondary | ICD-10-CM | POA: Diagnosis not present

## 2022-02-22 DIAGNOSIS — E785 Hyperlipidemia, unspecified: Secondary | ICD-10-CM | POA: Insufficient documentation

## 2022-02-22 DIAGNOSIS — Z7901 Long term (current) use of anticoagulants: Secondary | ICD-10-CM | POA: Insufficient documentation

## 2022-02-22 DIAGNOSIS — N183 Chronic kidney disease, stage 3 unspecified: Secondary | ICD-10-CM | POA: Diagnosis not present

## 2022-02-22 DIAGNOSIS — I480 Paroxysmal atrial fibrillation: Secondary | ICD-10-CM | POA: Diagnosis not present

## 2022-02-22 LAB — BASIC METABOLIC PANEL
Anion gap: 4 — ABNORMAL LOW (ref 5–15)
BUN: 22 mg/dL (ref 8–23)
CO2: 25 mmol/L (ref 22–32)
Calcium: 8.2 mg/dL — ABNORMAL LOW (ref 8.9–10.3)
Chloride: 107 mmol/L (ref 98–111)
Creatinine, Ser: 1.03 mg/dL (ref 0.61–1.24)
GFR, Estimated: >60 mL/min (ref >60)
Glucose, Bld: 99 mg/dL (ref 70–99)
Potassium: 4 mmol/L (ref 3.5–5.1)
Sodium: 136 mmol/L (ref 135–145)

## 2022-02-22 LAB — LIPID PANEL
Cholesterol: 79 mg/dL (ref 0–200)
HDL: 40 mg/dL — ABNORMAL LOW (ref >40)
LDL Cholesterol: 29 mg/dL (ref 0–99)
Total CHOL/HDL Ratio: 2 RATIO
Triglycerides: 48 mg/dL (ref <150)
VLDL: 10 mg/dL (ref 0–40)

## 2022-02-22 LAB — CBC
HCT: 36.2 % — ABNORMAL LOW (ref 39.0–52.0)
Hemoglobin: 11.3 g/dL — ABNORMAL LOW (ref 13.0–17.0)
MCH: 29.1 pg (ref 26.0–34.0)
MCHC: 31.2 g/dL (ref 30.0–36.0)
MCV: 93.3 fL (ref 80.0–100.0)
Platelets: 306 10*3/uL (ref 150–400)
RBC: 3.88 MIL/uL — ABNORMAL LOW (ref 4.22–5.81)
RDW: 14.7 % (ref 11.5–15.5)
WBC: 8.5 10*3/uL (ref 4.0–10.5)
nRBC: 0 % (ref 0.0–0.2)

## 2022-02-22 LAB — MAGNESIUM: Magnesium: 1.9 mg/dL (ref 1.7–2.4)

## 2022-02-22 MED ORDER — APIXABAN 5 MG PO TABS: 5.0000 mg | ORAL_TABLET | Freq: Two times a day (BID) | ORAL | 3 refills | Status: DC

## 2022-02-22 NOTE — Patient Instructions (Signed)
There has been no changes to your medications.  Labs done today, your results will be available in MyChart, we will contact you for abnormal readings.  Your physician recommends that you schedule a follow-up appointment in: 6 months ( July 2024)  ** please call the office in May to arrange your follow up appointment **   If you have any questions or concerns before your next appointment please send [place] a message through [place] or call our office at [phone].    TO LEAVE A MESSAGE FOR THE NURSE SELECT OPTION 2, PLEASE LEAVE A MESSAGE INCLUDING: YOUR NAME DATE OF BIRTH CALL BACK NUMBER REASON FOR CALL**this is important as we prioritize the call backs  YOU WILL RECEIVE A CALL BACK THE SAME DAY AS LONG AS YOU CALL BEFORE 4:00 PM  At the [facility], you and your health needs are our priority. As part of our continuing mission to provide you with exceptional heart care, we have created designated Provider Care Teams. These Care Teams include your primary Cardiologist (physician) and Advanced Practice Providers (APPs- Physician Assistants and Nurse Practitioners) who all work together to provide you with the care you need, when you need it.   You may see any of the following providers on your designated Care Team at your next follow up: Dr Glori Bickers Dr Loralie Champagne Dr. Roxana Hires, NP Lyda Jester, [place] [facility] [place], [place] Forestine Na, NP Audry Riles, PharmD   Please be sure to bring in all your medications bottles to every appointment.

## 2022-02-22 NOTE — Progress Notes (Signed)
Patient ID: [name] Spence, MD, male   DOB: Aug 29, 1929, 89 y.o.   MRN: [id number] PCP: [name], MD Cardiology: Dr. 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  He had an RV infarct with chronic total occlusion of a small nondominant RCA in the 1980s.  He had a drug-eluting stent to the CFX in 11/10. He is very symptomatic with atrial fibrillation episodes. He has had a chronic pattern of mild shortness of breath and mild left shoulder pain/chest aching when walking up a hill (x 20 years).   He can walk 3 miles on flat ground without problems.  He golfs 3-4 times a week.  He walks for 20-30 minutes on a treadmill several times a week.   Echo in 7/16 showed normal LV systolic function, EF 12-75% with moderate diastolic dysfunction.  Moderate TR with PA systolic pressure 65 mmHg.  Normal RV size and systolic function.  Given volume overload on exam and abnormal echo, he was started on low dose Lasix in the past.    He was started on Multaq but continued to have episodes of atrial fibrillation.  This was stopped, and he was admitted for Tikosyn loading.  After he went home, he developed left facial droop and left arm weakness.  He was found to have a small right parietal cortical infarct with hemorrhagic conversion.  His symptoms resolved quickly. INR was subtherapeutic at admission.  Warfarin was stopped and he was put on apixaban.   In 9/17, he was admitted with LLL PNA.  During the admission, he had a couple of relatively short runs of atrial fibrillation.  He developed more frequent episodes of atrial fibrillation at home and was started on Tikosyn.   In 5/19, he had bilateral orchiectomy for metastatic prostate cancer.  His PSA has come down considerably.   In 1/20, he had influenza that progressed to PNA.  He was hospitalized with transient atrial fibrillation.    In 11/20, he was  admitted with an E coli UTI and urosepsis with hypotension and AKI.  He was initially fluid-resuscitated and later required diuresis.  He was in atrial fibrillation while in the hospital.   Patient was admitted in 1/23 with influenza A as well as Strep pneumoniae PNA.  He was treated with oseltamivir and antibiotics. While in the hospital, he went into atrial fibrillation with controlled rate.    Patient returns for followup of atrial fibrillation.  Patient reports 1 episode of atrial fibrillation noted by his Apple Watch on New Years Day.  He converted back to NSR.  No significant chest pain or exertional dyspnea. He has not needed any Lasix.  No lightheadedness.  Working with Physiological scientist.  Occasional wheezing, this is chronic for him (has asthma). Weight stable.   ECG (personally reviewed): NSR, PAC, QTc 477 msec   Labs (6/11): LDL 63, HDL 35, K 5.5, creatinine 1.1 Labs (6/12): LDL 58, HDL 53, TSH normal, K 4.7, creatinine 1 Labs (12/12): BNP 79 Labs (7/14): K 5, creatinine 1.1, LDL 48, HDL 34 Labs (8/15): LDL 48, HDL 33, TSH normal, K 5.4, creatinine 1.2 Labs (6/16): K 5.1, creatinine 1.27, HCT 42.5 Labs (10/16): LDL 37, HDL 32 Labs (11/16): K 4.4, creatinine 1.36, BNP 103 Labs  (2/17): K 4.5, creatinine 1.38, Mg 2.0, LDL 32 Labs (3/17): HCT 41.7 Labs (5/17): K 4.2, creatinine 1.17, Mg 2.1 Labs (9/17): K 4.2,  creatinine 1.16, HCT 37.3 Labs (11/17): hgb 13.3, LDL 40 Labs (5/18): K 4.5, creatinine 1.25 Labs (5/19): K 5.5, creatinine 1.16 Labs (2/20): K 4, creatinine 1.24, hgb 12.2 Labs (11/20): K 3.4, creatinine 1.34, hgb 10.3, LDL 49 Labs (12/20): K 4.5, creatinine 1.2 Labs (2/21): K 3.9, creatinine 1.5, TSH 4.5 (mildly elevated) Labs (5/21): TSH normal, K 5, creatinine 1.27 Labs (9/21): K 4.1, creatinine 1.22, LDL 40, TSH normal Labs (4/22): K 4.2, creatinine 1.11 Labs (8/22): hgb 12, LDL 36 Labs (10/22): K 4.5, creatinine 1.38 Labs (1/23): K 4.6, creatinine 1.15, hgb  11.4 Labs (4/23): LDL 37 Labs (7/23): hgb 7.8, K 4.5, creatinine 1.14 Labs (8/23): hgb 9  PMH: 1. CAD: RV infarct in the 1980s.  Chronic total occlusion of a small nondominant RCA.  11/10 PCI to CFX with Xience DES.  EF 60% on LV-gram at that time. ETT (11/14) with 9'07" exercise, no significant ST depression.  2.  Paroxysmal atrial fibrillation: Multaq was ineffective. Now on Tikosyn.  3.  Hyperlipidemia 4. GERD 5. HTN 6. Asthma 7. Chronic diastolic CHF: Echo (8/75): EF 55-60%, mild LV hypertrophy, no regional WMAs, mild AI, mild MR, mild-moderate biatrial enlargement, PA systolic pressure 51 mmHg. Echo (7/16) with EF 60-65%, moderate diastolic dysfunction, normal RV size and systolic function, mild-moderate MR, moderate TR, moderate biatrial enlargement, PA systolic pressure 65 mmHg.  - Echo (5/18): EF 55-60%, mild AI, mild MR, PASP 60 mmHg, normal RV size and systolic function.  - Echo (8/19): EF 60-65%, normal RV size and systolic function, severe TR with PASP 60 mmHg.  8. CKD 9. CVA: Right parietal cortical CVA with peri-infarct hemorrhagic conversion.  CTA showed right M2 branch flow gap.   10. Prostate cancer: Metastatic.  - Bilateral orchiectomy in 5/19.   SH: Nonsmoker.  Retired Stage manager, lives in [place].  2 daughters.  Originally from [place].   FH: Brother with CABG.   ROS: All systems reviewed and negative except as per HPI.    Current Outpatient Medications  Medication Sig Dispense Refill   atorvastatin (LIPITOR) 20 MG tablet TAKE 1 TABLET BY MOUTH EVERY DAY 90 tablet 3   beclomethasone (BECONASE-AQ) 42 MCG/SPRAY nasal spray Place 1 spray into both nostrils 2 (two) times daily.     buPROPion (WELLBUTRIN SR) 150 MG 12 hr tablet TAKE 1 TABLET(150 MG) BY MOUTH DAILY 90 tablet 1   cetirizine (ZYRTEC) 10 MG tablet Take 10 mg by mouth daily with supper.      Cholecalciferol (VITAMIN D) 2000 units tablet Take 2,000 Units by mouth daily.     denosumab (PROLIA) 60  MG/ML SOSY injection Inject 60 mg into the skin every 6 (six) months.     dofetilide (TIKOSYN) 250 MCG capsule TAKE 1 CAPSULE(250 MCG) BY MOUTH TWICE DAILY 120 capsule 3   FLUoxetine (PROZAC) 10 MG capsule TAKE 1 CAPSULE(10 MG) BY MOUTH DAILY 90 capsule 1   Fluticasone Furoate (ARNUITY ELLIPTA) 200 MCG/ACT AEPB Inhale 1 puff into the lungs daily. 180 each 3   Lactase (LACTAID PO) Take 1 tablet by mouth as needed (lactose intolerant).     LORazepam (ATIVAN) 1 MG tablet Take 0.5-1 tablets (0.5-1 mg total) by mouth at bedtime as needed. 90 tablet 1   nitroGLYCERIN (NITROSTAT) 0.4 MG SL tablet Place 1 tablet (0.4 mg total) under the tongue every 5 (five) minutes x 3 doses as needed for chest pain. 25 tablet 2   omeprazole (PRILOSEC OTC) 20 MG tablet Take 1 tablet (20 mg total)  by mouth daily. 100 tablet 4   Polyethyl Glycol-Propyl Glycol (SYSTANE OP) Place 1 drop into both eyes as needed (dry eye).     tamsulosin (FLOMAX) 0.4 MG CAPS capsule TAKE 1 CAPSULE(0.4 MG) BY MOUTH DAILY 90 capsule 1   traMADol (ULTRAM) 50 MG tablet Take 1 tablet (50 mg total) by mouth every 6 (six) hours as needed (pain). 30 tablet 1   apixaban (ELIQUIS) 5 MG TABS tablet Take 1 tablet (5 mg total) by mouth 2 (two) times daily. 180 tablet 3   No current facility-administered medications for this encounter.    BP (!) 140/70   Pulse 68   Wt 67 kg (147 lb 9.6 oz)   SpO2 97%   BMI 21.33 kg/m  General: NAD Neck: No JVD, no thyromegaly or thyroid nodule.  Lungs: Clear to auscultation bilaterally with normal respiratory effort. CV: Nondisplaced PMI.  Heart regular S1/S2, no S3/S4, no murmur.  No peripheral edema.  No carotid bruit.  Normal pedal pulses.  Abdomen: Soft, nontender, no hepatosplenomegaly, no distention.  Skin: Intact without lesions or rashes.  Neurologic: Alert and oriented x 3.  Psych: Normal affect. Extremities: No clubbing or cyanosis.  HEENT: Normal.   Assessment/Plan: 1. CAD: He has chronic stable  angina walking up a steep hill (manifests as left shoulder pain).  No change in pattern x 15 years.   - Continue medical management of angina, no indication for stress test or cath.  - He is on apixaban without ASA given stable CAD.   - Continue statin, check lipids today. .   2. Hyperlipidemia: He is on atorvastatin 20 mg daily, check lipids today.  3. Chronic diastolic CHF:  Most recent echo in 8/19 showed EF 16-10% but PA systolic pressure was estimated at 60 mmHg and there was severe TR (progressed from moderate), the RV looked normal.  I suspect that he has a degree of RV failure.  He takes Lasix prn, no recent use.  He is not volume overloaded by exam. NYHA class II.   - He has wanted to hold off on repeat echo.  Given age, think this is reasonable as unlikely to change management much.   - He can continue current prn Lasix use, has not needed recently.  4. Atrial fibrillation: Paroxysmal.  Episodes are quite symptomatic (fatigue) and often triggered by some form of physical stress, he feels "bad" with atrial fibrillation even when he's rate-controlled. He had frequent breakthrough on Multaq but and is now on Tikosyn.  QTc ok on today's ECG.  He is now off diltiazem CD and atenolol due to low BP and orthostasis.  He is in NSR today, no recent AF symptoms.  - Can take metoprolol 25 mg prn tachypalpitations.  - Continue Tikosyn, QTc acceptable on today's ECG.  BMET/Mg today. - If he develops more frequent breakthrough afib on Tikosyn, amiodarone would be an option.   - Continue Eliquis.  5. Tricuspid regurgitation: Severe on 8/19 echo, but no significant murmur noted on exam today.  Not candidate for surgical TVR and not interested in referral to trial for percutaneous repair.   Followup in 6 months.    Loralie Champagne 02/22/2022

## 2022-03-12 DIAGNOSIS — C61 Malignant neoplasm of prostate: Secondary | ICD-10-CM | POA: Diagnosis not present

## 2022-03-19 DIAGNOSIS — N401 Enlarged prostate with lower urinary tract symptoms: Secondary | ICD-10-CM | POA: Diagnosis not present

## 2022-03-19 DIAGNOSIS — C61 Malignant neoplasm of prostate: Secondary | ICD-10-CM | POA: Diagnosis not present

## 2022-03-19 DIAGNOSIS — C7951 Secondary malignant neoplasm of bone: Secondary | ICD-10-CM | POA: Diagnosis not present

## 2022-03-19 DIAGNOSIS — R351 Nocturia: Secondary | ICD-10-CM | POA: Diagnosis not present

## 2022-03-24 ENCOUNTER — Other Ambulatory Visit: Payer: Self-pay | Admitting: Family Medicine

## 2022-03-24 MED ORDER — MOLNUPIRAVIR EUA 200MG CAPSULE: 4.0000 | ORAL_CAPSULE | Freq: Two times a day (BID) | ORAL | 0 refills | Status: AC

## 2022-03-24 NOTE — Progress Notes (Signed)
Just diagnosed with COVID yesterday.  His wife had been recently diagnosed.  He was able to rest fairly well last night.  He had developed some cough and chills yesterday with some mild bodyaches.  No fever.  Given his age of 71 and comorbidities would favor consideration for antivirals.  Would not recommend Paxlovid because of his Tikosyn and multiple other potential drug interactions.  Will send in molnupiravir 4 capsules by mouth twice daily for 5 days.  Continue monitoring O2 sats.  Go to ER or call 911 for any increased dyspnea especially with O2 sats less than 90%  [name] MD [facility]

## 2022-03-31 ENCOUNTER — Other Ambulatory Visit: Payer: Self-pay | Admitting: Family Medicine

## 2022-04-07 ENCOUNTER — Other Ambulatory Visit: Payer: Self-pay | Admitting: Internal Medicine

## 2022-04-09 ENCOUNTER — Telehealth: Payer: Self-pay | Admitting: *Deleted

## 2022-04-09 DIAGNOSIS — N1831 Chronic kidney disease, stage 3a: Secondary | ICD-10-CM

## 2022-04-09 DIAGNOSIS — I5032 Chronic diastolic (congestive) heart failure: Secondary | ICD-10-CM

## 2022-04-09 DIAGNOSIS — I480 Paroxysmal atrial fibrillation: Secondary | ICD-10-CM

## 2022-04-09 NOTE — Telephone Encounter (Signed)
Pharmacy referral entered as requested.

## 2022-04-11 ENCOUNTER — Other Ambulatory Visit: Payer: Self-pay | Admitting: Internal Medicine

## 2022-04-12 ENCOUNTER — Telehealth: Payer: Self-pay

## 2022-04-12 NOTE — Progress Notes (Signed)
Care Management & Coordination Services Pharmacy Team  Reason for Encounter: Appointment Reminder  Contacted patient to confirm telephone appointment with Burman Riis, PharmD on 04/14/2022 at 4:00. Spoke with patient on 04/12/2022   Have you seen any other providers since your last visit?  Patient denies any visits not noted in epic  Any changes in your medications or health? Patient denies  Any side effects from any medications? Patient denies  Do you have an symptoms or problems not managed by your medications? Patient denies  Any concerns about your health right now? Patient denies  Has your provider asked that you check blood pressure, blood sugar, or follow special diet at home? Patient denies  Do you get any type of exercise on a regular basis? Patient has a Physiological scientist twice weekly, daily exercise and swings his golf club daily  Can you think of a goal you would like to reach for your health? Patient denies  Do you have any problems getting your medications? Patient denies  Is there anything that you would like to discuss during the appointment? Patient denies  Patient notified to have medications and supplements ready for review at his appointment.  Chart review:  Recent office visits:  12/17/2022 Carolann Littler MD - Patient was seen for physical exam. Changed Tramadol 50 mg to q 6 hours prn.   Recent consult visits:  02/22/2022 Loralie Champagne MD (cardiology) - Patient was seen for Chronic diastolic heart failure and an additional concern. No medication changes.   11/09/2021 Loralie Champagne MD (cardiology) - Patient was seen for Chronic diastolic heart failure. Recommended Iron 325 mg daily.   10/20/2021 Cristopher Peru MD (cardiology) - Patient was seen for Paroxysmal atrial fibrillation and an additional concern. No medication changes.   Hospital visits:  None  Care Gaps: AWV - completed 04/15/2021, scheduled 04/19/2022 Last BP - 140/70 on 02/22/2022 Covid -  overdue  Star Rating Drugs:  Atorvastatin 20 mg - last filled 03/29/2022 90 DS at [facility] Pharmacist Assistant [phone]

## 2022-04-13 ENCOUNTER — Inpatient Hospital Stay (HOSPITAL_COMMUNITY)
Admission: EM | Admit: 2022-04-13 | Discharge: 2022-04-15 | DRG: 194 | Disposition: A | Discharge status: Home / Self Care | Payer: PPO | Attending: Internal Medicine | Admitting: Internal Medicine

## 2022-04-13 ENCOUNTER — Other Ambulatory Visit: Payer: Self-pay

## 2022-04-13 ENCOUNTER — Encounter (HOSPITAL_COMMUNITY): Payer: Self-pay

## 2022-04-13 ENCOUNTER — Emergency Department (HOSPITAL_COMMUNITY): Payer: PPO

## 2022-04-13 DIAGNOSIS — Z79899 Other long term (current) drug therapy: Secondary | ICD-10-CM | POA: Diagnosis not present

## 2022-04-13 DIAGNOSIS — R509 Fever, unspecified: Secondary | ICD-10-CM

## 2022-04-13 DIAGNOSIS — R351 Nocturia: Secondary | ICD-10-CM

## 2022-04-13 DIAGNOSIS — J189 Pneumonia, unspecified organism: Secondary | ICD-10-CM | POA: Diagnosis not present

## 2022-04-13 DIAGNOSIS — Z8616 Personal history of COVID-19: Secondary | ICD-10-CM | POA: Diagnosis not present

## 2022-04-13 DIAGNOSIS — K219 Gastro-esophageal reflux disease without esophagitis: Secondary | ICD-10-CM | POA: Diagnosis not present

## 2022-04-13 DIAGNOSIS — Z955 Presence of coronary angioplasty implant and graft: Secondary | ICD-10-CM

## 2022-04-13 DIAGNOSIS — Z881 Allergy status to other antibiotic agents status: Secondary | ICD-10-CM

## 2022-04-13 DIAGNOSIS — Z9842 Cataract extraction status, left eye: Secondary | ICD-10-CM | POA: Diagnosis not present

## 2022-04-13 DIAGNOSIS — I130 Hypertensive heart and chronic kidney disease with heart failure and stage 1 through stage 4 chronic kidney disease, or unspecified chronic kidney disease: Secondary | ICD-10-CM | POA: Diagnosis not present

## 2022-04-13 DIAGNOSIS — Z9841 Cataract extraction status, right eye: Secondary | ICD-10-CM | POA: Diagnosis not present

## 2022-04-13 DIAGNOSIS — Z9861 Coronary angioplasty status: Secondary | ICD-10-CM

## 2022-04-13 DIAGNOSIS — Z961 Presence of intraocular lens: Secondary | ICD-10-CM | POA: Diagnosis present

## 2022-04-13 DIAGNOSIS — N1831 Chronic kidney disease, stage 3a: Secondary | ICD-10-CM | POA: Diagnosis present

## 2022-04-13 DIAGNOSIS — Z825 Family history of asthma and other chronic lower respiratory diseases: Secondary | ICD-10-CM | POA: Diagnosis not present

## 2022-04-13 DIAGNOSIS — Z8249 Family history of ischemic heart disease and other diseases of the circulatory system: Secondary | ICD-10-CM

## 2022-04-13 DIAGNOSIS — I5032 Chronic diastolic (congestive) heart failure: Secondary | ICD-10-CM | POA: Diagnosis not present

## 2022-04-13 DIAGNOSIS — F411 Generalized anxiety disorder: Secondary | ICD-10-CM | POA: Diagnosis present

## 2022-04-13 DIAGNOSIS — Z8673 Personal history of transient ischemic attack (TIA), and cerebral infarction without residual deficits: Secondary | ICD-10-CM

## 2022-04-13 DIAGNOSIS — J4530 Mild persistent asthma, uncomplicated: Secondary | ICD-10-CM | POA: Diagnosis not present

## 2022-04-13 DIAGNOSIS — I252 Old myocardial infarction: Secondary | ICD-10-CM

## 2022-04-13 DIAGNOSIS — I480 Paroxysmal atrial fibrillation: Secondary | ICD-10-CM | POA: Diagnosis present

## 2022-04-13 DIAGNOSIS — Z888 Allergy status to other drugs, medicaments and biological substances status: Secondary | ICD-10-CM

## 2022-04-13 DIAGNOSIS — J9811 Atelectasis: Secondary | ICD-10-CM | POA: Diagnosis not present

## 2022-04-13 DIAGNOSIS — F32A Depression, unspecified: Secondary | ICD-10-CM | POA: Diagnosis not present

## 2022-04-13 DIAGNOSIS — I2510 Atherosclerotic heart disease of native coronary artery without angina pectoris: Secondary | ICD-10-CM

## 2022-04-13 DIAGNOSIS — R531 Weakness: Secondary | ICD-10-CM | POA: Diagnosis not present

## 2022-04-13 DIAGNOSIS — Z7901 Long term (current) use of anticoagulants: Secondary | ICD-10-CM

## 2022-04-13 DIAGNOSIS — R059 Cough, unspecified: Secondary | ICD-10-CM | POA: Diagnosis not present

## 2022-04-13 DIAGNOSIS — E785 Hyperlipidemia, unspecified: Secondary | ICD-10-CM | POA: Diagnosis present

## 2022-04-13 DIAGNOSIS — D649 Anemia, unspecified: Secondary | ICD-10-CM | POA: Diagnosis not present

## 2022-04-13 DIAGNOSIS — Z96653 Presence of artificial knee joint, bilateral: Secondary | ICD-10-CM | POA: Diagnosis present

## 2022-04-13 DIAGNOSIS — I10 Essential (primary) hypertension: Secondary | ICD-10-CM | POA: Diagnosis present

## 2022-04-13 DIAGNOSIS — R519 Headache, unspecified: Secondary | ICD-10-CM | POA: Diagnosis not present

## 2022-04-13 DIAGNOSIS — N401 Enlarged prostate with lower urinary tract symptoms: Secondary | ICD-10-CM | POA: Diagnosis present

## 2022-04-13 LAB — URINALYSIS, ROUTINE W REFLEX MICROSCOPIC
Bacteria, UA: NONE SEEN
Bilirubin Urine: NEGATIVE
Glucose, UA: NEGATIVE mg/dL
Ketones, ur: NEGATIVE mg/dL
Leukocytes,Ua: NEGATIVE
Nitrite: NEGATIVE
Protein, ur: NEGATIVE mg/dL
Specific Gravity, Urine: 1.017 (ref 1.005–1.030)
pH: 7 (ref 5.0–8.0)

## 2022-04-13 LAB — CBC
HCT: 37.6 % — ABNORMAL LOW (ref 39.0–52.0)
Hemoglobin: 11.7 g/dL — ABNORMAL LOW (ref 13.0–17.0)
MCH: 29.2 pg (ref 26.0–34.0)
MCHC: 31.1 g/dL (ref 30.0–36.0)
MCV: 93.8 fL (ref 80.0–100.0)
Platelets: 209 10*3/uL (ref 150–400)
RBC: 4.01 MIL/uL — ABNORMAL LOW (ref 4.22–5.81)
RDW: 14.2 % (ref 11.5–15.5)
WBC: 17.5 10*3/uL — ABNORMAL HIGH (ref 4.0–10.5)
nRBC: 0 % (ref 0.0–0.2)

## 2022-04-13 LAB — COMPREHENSIVE METABOLIC PANEL
ALT: 12 U/L (ref 0–44)
AST: 16 U/L (ref 15–41)
Albumin: 3.5 g/dL (ref 3.5–5.0)
Alkaline Phosphatase: 61 U/L (ref 38–126)
Anion gap: 9 (ref 5–15)
BUN: 23 mg/dL (ref 8–23)
CO2: 23 mmol/L (ref 22–32)
Calcium: 8.5 mg/dL — ABNORMAL LOW (ref 8.9–10.3)
Chloride: 101 mmol/L (ref 98–111)
Creatinine, Ser: 1.11 mg/dL (ref 0.61–1.24)
GFR, Estimated: >60 mL/min (ref >60)
Glucose, Bld: 116 mg/dL — ABNORMAL HIGH (ref 70–99)
Potassium: 4.4 mmol/L (ref 3.5–5.1)
Sodium: 133 mmol/L — ABNORMAL LOW (ref 135–145)
Total Bilirubin: 0.7 mg/dL (ref 0.3–1.2)
Total Protein: 6.5 g/dL (ref 6.5–8.1)

## 2022-04-13 LAB — TROPONIN I (HIGH SENSITIVITY)
Troponin I (High Sensitivity): 7 ng/L (ref <18)
Troponin I (High Sensitivity): 8 ng/L (ref <18)

## 2022-04-13 LAB — LACTIC ACID, PLASMA: Lactic Acid, Venous: 1.2 mmol/L (ref 0.5–1.9)

## 2022-04-13 MED ORDER — SODIUM CHLORIDE 0.9 % IV SOLN
1.0000 g | Freq: Once | INTRAVENOUS | Status: AC
  Administered 2022-04-13: 1 g via INTRAVENOUS
  Filled 2022-04-13: qty 10

## 2022-04-13 MED ORDER — LORAZEPAM 0.5 MG PO TABS: 0.5000 mg | ORAL_TABLET | Freq: Every evening | ORAL | Status: DC | PRN

## 2022-04-13 MED ORDER — BUDESONIDE 0.5 MG/2ML IN SUSP
0.5000 mg | Freq: Two times a day (BID) | RESPIRATORY_TRACT | Status: DC
  Administered 2022-04-13 – 2022-04-15 (×4): 0.5 mg via RESPIRATORY_TRACT
  Filled 2022-04-13 (×3): qty 2

## 2022-04-13 MED ORDER — PANTOPRAZOLE SODIUM 40 MG PO TBEC
40.0000 mg | DELAYED_RELEASE_TABLET | Freq: Every day | ORAL | Status: DC
  Administered 2022-04-14 – 2022-04-15 (×2): 40 mg via ORAL
  Filled 2022-04-13 (×2): qty 1

## 2022-04-13 MED ORDER — POLYETHYL GLYCOL-PROPYL GLYCOL 0.4-0.3 % OP GEL: OPHTHALMIC | Status: DC | PRN

## 2022-04-13 MED ORDER — SODIUM CHLORIDE 0.9 % IV SOLN
1.0000 g | INTRAVENOUS | Status: DC
  Administered 2022-04-14: 1 g via INTRAVENOUS
  Filled 2022-04-13: qty 10

## 2022-04-13 MED ORDER — DOFETILIDE 250 MCG PO CAPS
250.0000 ug | ORAL_CAPSULE | Freq: Two times a day (BID) | ORAL | Status: DC
  Administered 2022-04-13 – 2022-04-14 (×3): 250 ug via ORAL
  Filled 2022-04-13 (×5): qty 1

## 2022-04-13 MED ORDER — POLYETHYLENE GLYCOL 3350 17 G PO PACK: 17.0000 g | PACK | Freq: Every day | ORAL | Status: DC | PRN

## 2022-04-13 MED ORDER — OMEPRAZOLE MAGNESIUM 20 MG PO TBEC: 20.0000 mg | DELAYED_RELEASE_TABLET | Freq: Every day | ORAL | Status: DC

## 2022-04-13 MED ORDER — POLYVINYL ALCOHOL 1.4 % OP SOLN: 1.0000 [drp] | OPHTHALMIC | Status: DC | PRN

## 2022-04-13 MED ORDER — ATORVASTATIN CALCIUM 10 MG PO TABS
20.0000 mg | ORAL_TABLET | Freq: Every day | ORAL | Status: DC
  Administered 2022-04-13 – 2022-04-14 (×2): 20 mg via ORAL
  Filled 2022-04-13 (×2): qty 2

## 2022-04-13 MED ORDER — SODIUM CHLORIDE 0.9 % IV SOLN
100.0000 mg | Freq: Once | INTRAVENOUS | Status: AC
  Administered 2022-04-13: 100 mg via INTRAVENOUS
  Filled 2022-04-13: qty 100

## 2022-04-13 MED ORDER — LACTASE 3000 UNITS PO TABS: 3000.0000 [IU] | ORAL_TABLET | ORAL | Status: DC | PRN

## 2022-04-13 MED ORDER — BUPROPION HCL ER (SR) 150 MG PO TB12
150.0000 mg | ORAL_TABLET | Freq: Every day | ORAL | Status: DC
  Administered 2022-04-14 – 2022-04-15 (×2): 150 mg via ORAL
  Filled 2022-04-13 (×2): qty 1

## 2022-04-13 MED ORDER — SODIUM CHLORIDE 0.9 % IV SOLN
100.0000 mg | Freq: Two times a day (BID) | INTRAVENOUS | Status: DC
  Administered 2022-04-14 – 2022-04-15 (×3): 100 mg via INTRAVENOUS
  Filled 2022-04-13 (×4): qty 100

## 2022-04-13 MED ORDER — APIXABAN 5 MG PO TABS
5.0000 mg | ORAL_TABLET | Freq: Two times a day (BID) | ORAL | Status: DC
  Administered 2022-04-13 – 2022-04-15 (×4): 5 mg via ORAL
  Filled 2022-04-13 (×4): qty 1

## 2022-04-13 MED ORDER — FLUTICASONE FUROATE 200 MCG/ACT IN AEPB: 1.0000 | INHALATION_SPRAY | Freq: Every day | RESPIRATORY_TRACT | Status: DC

## 2022-04-13 MED ORDER — LACTATED RINGERS IV BOLUS
500.0000 mL | Freq: Once | INTRAVENOUS | Status: AC
  Administered 2022-04-13: 500 mL via INTRAVENOUS

## 2022-04-13 MED ORDER — TAMSULOSIN HCL 0.4 MG PO CAPS
0.4000 mg | ORAL_CAPSULE | Freq: Every day | ORAL | Status: DC
  Administered 2022-04-13 – 2022-04-14 (×2): 0.4 mg via ORAL
  Filled 2022-04-13 (×2): qty 1

## 2022-04-13 MED ORDER — SODIUM CHLORIDE 0.9% FLUSH
3.0000 mL | Freq: Two times a day (BID) | INTRAVENOUS | Status: DC
  Administered 2022-04-13 – 2022-04-14 (×3): 3 mL via INTRAVENOUS

## 2022-04-13 MED ORDER — FLUOXETINE HCL 10 MG PO CAPS
10.0000 mg | ORAL_CAPSULE | Freq: Every day | ORAL | Status: DC
  Administered 2022-04-14: 10 mg via ORAL
  Filled 2022-04-13 (×3): qty 1

## 2022-04-13 NOTE — ED Notes (Signed)
Patient's bedding changed. Pt provided clean linen and given a warm blanket. Wife who is his caregiver is at bedside at this time.

## 2022-04-13 NOTE — ED Triage Notes (Signed)
Pt from home with ems, had COVID 3 weeks ago with cough still present. Pt c.o generalized weakness and unsteady gait. Denies SOB/CP. In a fib en route.  O2 94% room air Rhonchi lung sounds Denies n/v/d. 152/75 P89 rr21 cap 31 CBG 132

## 2022-04-13 NOTE — H&P (Signed)
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  Patient reports he had COVID 3 weeks ago and was fairly sick with flulike symptoms but no severe symptoms. Reports that this morning he began to feel generally weak.  Felt he thought it could be related to his A-fib.  He also reports some intermittent unsteady gait, possibly lightheadedness.  Also some shortness of breath that is mild and not persistent.  Denies fevers, chills, chest pain, abdominal pain, constipation, diarrhea, nausea, vomiting.  ED Course: Vital signs in the ED significant for fever to one 1.1, blood pressure in the [id number] to [id number] systolic.  Lab workup included CMP with sodium 133, glucose 116, calcium 8.5.  CBC showed leukocytosis to 17.5, hemoglobin stable 11.7.  Troponin negative x 2.  Lactic acid negative.  Urinalysis and blood cultures pending.  Chest x-ray showed faint right medial opacities representing pneumonia versus atelectasis.  Also noted was left basilar scarring.  CT head showed no acute abnormality and stable chronic changes.  Patient received ceftriaxone and doxycycline given history of azithromycin allergy and QT prolongation.  Also received 500 cc IV fluid.  Review of Systems: As per HPI otherwise all other systems reviewed and are negative.  Past Medical History:  Diagnosis Date   Acute blood loss anemia 08/27/2021   Acute hemorrhagic infarction of brain [facility])    Allergic rhinitis    Anxiety    Asthma    Atrial fibrillation ([place])    CAD (coronary artery disease)    CHF (congestive heart failure) ([place])    Depression    Diverticulosis 03/16/1995   DJD  (degenerative joint disease)    "most joints" (03/11/2015)   GERD (gastroesophageal reflux disease) 12/30/2000   Heart attack ([place]) 1984   Hiatal hernia 12/30/2000   History of blood transfusion 1988   "related to GI bleeding OR"   History of duodenal ulcer 10/15/1986   Hyperlipidemia    Hypertension    ICH (intracerebral hemorrhage) ([place])    Sepsis ([place]) 12/2018   Sinus bradycardia    Stroke [facility])     Past Surgical History:  Procedure Laterality Date   CARDIAC CATHETERIZATION     CATARACT EXTRACTION W/ INTRAOCULAR LENS  IMPLANT, BILATERAL Bilateral ?2013   [place] WITH STENT PLACEMENT  2010   hospital ccu  1984   heart attack, hypoplastic right coronary   INGUINAL HERNIA REPAIR Right ~ [place] Bilateral 06/10/2017   Procedure: BILATERAL ORCHIECTOMY;  Surgeon: Franchot Gallo, MD;  Location: [facility]S;  Service: Urology;  Laterality: Bilateral;  MAC ANESTHESIA AND LOCAL   TONSILLECTOMY     TOTAL KNEE ARTHROPLASTY Bilateral 1998-2004   "right-left"   VAGOTOMY AND PYLOROPLASTY  1988   "bleeding duodenal ulcer"    Social History  reports that he has never smoked. He has never used smokeless tobacco. He reports that he does not drink alcohol and does not use drugs.  Allergies  Allergen Reactions   Tylenol [Acetaminophen] Rash   Azithromycin Other (See Comments)    Causes Afib   Advil [Ibuprofen] Nausea Only and Rash  Asa [Aspirin] Rash    Family History  Problem Relation Age of Onset   Coronary artery disease Mother        deceased   Deep vein thrombosis Sister    Coronary artery disease Brother    Asthma Brother    Colon cancer Neg Hx   Reviewed on admission  Prior to Admission medications   Medication Sig Start Date End Date Taking? Authorizing Provider  apixaban (ELIQUIS) 5 MG TABS tablet Take 1 tablet (5 mg total) by mouth 2 (two) times daily. 02/22/22   Larey Dresser, MD   atorvastatin (LIPITOR) 20 MG tablet TAKE 1 TABLET BY MOUTH EVERY DAY 01/05/22   Burchette, Alinda Sierras, MD  beclomethasone (BECONASE-AQ) 42 MCG/SPRAY nasal spray Place 1 spray into both nostrils 2 (two) times daily.    [provider]  buPROPion (WELLBUTRIN SR) 150 MG 12 hr tablet TAKE 1 TABLET(150 MG) BY MOUTH DAILY 02/03/22   Burchette, Alinda Sierras, MD  cetirizine (ZYRTEC) 10 MG tablet Take 10 mg by mouth daily with supper.     [provider]  Cholecalciferol (VITAMIN D) 2000 units tablet Take 2,000 Units by mouth daily.    [provider]  denosumab (PROLIA) 60 MG/ML SOSY injection Inject 60 mg into the skin every 6 (six) months.    [provider]  dofetilide (TIKOSYN) 250 MCG capsule TAKE 1 CAPSULE(250 MCG) BY MOUTH TWICE DAILY 07/27/21   Larey Dresser, MD  FLUoxetine (PROZAC) 10 MG capsule TAKE 1 CAPSULE(10 MG) BY MOUTH DAILY 01/08/22   Burchette, Alinda Sierras, MD  Fluticasone Furoate (ARNUITY ELLIPTA) 200 MCG/ACT AEPB INHALE 1 PUFF INTO THE LUNGS DAILY 04/12/22   Brand Males, MD  Lactase (LACTAID PO) Take 1 tablet by mouth as needed (lactose intolerant).    [provider]  LORazepam (ATIVAN) 1 MG tablet TAKE 1/2 TO 1 TABLET(0.5 TO 1 MG) BY MOUTH AT BEDTIME AS NEEDED 03/31/22   Burchette, Alinda Sierras, MD  nitroGLYCERIN (NITROSTAT) 0.4 MG SL tablet Place 1 tablet (0.4 mg total) under the tongue every 5 (five) minutes x 3 doses as needed for chest pain. 01/04/17   [name], MD  omeprazole (PRILOSEC OTC) 20 MG tablet Take 1 tablet (20 mg total) by mouth daily. 09/17/13   [name], MD  Polyethyl Glycol-Propyl Glycol (SYSTANE OP) Place 1 drop into both eyes as needed (dry eye).    [provider]  tamsulosin (FLOMAX) 0.4 MG CAPS capsule TAKE 1 CAPSULE(0.4 MG) BY MOUTH DAILY 01/05/22   Burchette, Alinda Sierras, MD  traMADol (ULTRAM) 50 MG tablet Take 1 tablet (50 mg total) by mouth every 6 (six) hours as needed (pain). 12/16/21   [name],  MD    Physical Exam: Vitals:   04/13/22 1029 04/13/22 1300 04/13/22 1323 04/13/22 1324  BP: 132/71 (!) 127/55    Pulse: 89 86    Resp: 15 18    Temp: 98.5 F (36.9 C)  (!) 101.1 F (38.4 C) (!) 101.1 F (38.4 C)  TempSrc: Oral  Rectal Rectal  SpO2: 95% 95%    Weight:      Height:        Physical Exam Constitutional:      General: He is not in acute distress.    Appearance: Normal appearance.  HENT:     Head: Normocephalic and atraumatic.     Mouth/Throat:     Mouth: Mucous membranes are moist.     Pharynx: Oropharynx is clear.  Eyes:     Extraocular Movements: Extraocular movements intact.     Pupils: Pupils are equal, round, and reactive to light.  Cardiovascular:     Rate and Rhythm: Normal rate and regular rhythm.     Pulses: Normal pulses.     Heart sounds: Normal heart sounds.  Pulmonary:     Effort: Pulmonary effort is normal. No respiratory distress.     Breath sounds: Examination of the right-middle field reveals rhonchi. Rhonchi present.  Abdominal:     General: Bowel sounds are normal. There is no distension.     Palpations: Abdomen is soft.     Tenderness: There is no abdominal tenderness.  Musculoskeletal:        General: No swelling or deformity.  Skin:    General: Skin is warm and dry.  Neurological:     General: No focal deficit present.     Mental Status: Mental status is at baseline.    Labs on Admission: I have personally reviewed following labs and imaging studies  CBC: Recent Labs  Lab 04/13/22 1101  WBC 17.5*  HGB 11.7*  HCT 37.6*  MCV 93.8  PLT [id number]    Basic Metabolic Panel: Recent Labs  Lab 04/13/22 1101  NA 133*  K 4.4  CL 101  CO2 23  GLUCOSE 116*  BUN 23  CREATININE 1.11  CALCIUM 8.5*    GFR: Estimated Creatinine Clearance: 41.4 mL/min (by C-G formula based on SCr of 1.11 mg/dL).  Liver Function Tests: Recent Labs  Lab 04/13/22 1101  AST 16  ALT 12  ALKPHOS 61  BILITOT 0.7  PROT 6.5  ALBUMIN 3.5     Urine analysis:    Component Value Date/Time   COLORURINE AMBER (A) 12/26/2018 1640   APPEARANCEUR CLOUDY (A) 12/26/2018 1640   LABSPEC 1.016 12/26/2018 1640   PHURINE 5.0 12/26/2018 1640   GLUCOSEU NEGATIVE 12/26/2018 1640   HGBUR MODERATE (A) 12/26/2018 1640   HGBUR trace-lysed 07/18/2009 1022   BILIRUBINUR NEGATIVE 12/26/2018 1640   BILIRUBINUR N 01/04/2017 1524   KETONESUR NEGATIVE 12/26/2018 1640   PROTEINUR 100 (A) 12/26/2018 1640   UROBILINOGEN 0.2 01/04/2017 1524   UROBILINOGEN 0.2 07/18/2009 1022   NITRITE NEGATIVE 12/26/2018 1640   LEUKOCYTESUR LARGE (A) 12/26/2018 1640    Radiological Exams on Admission: CT Head Wo Contrast  Result Date: 04/13/2022 CLINICAL DATA:  Headaches EXAM: CT HEAD WITHOUT CONTRAST TECHNIQUE: Contiguous axial images were obtained from the base of the skull through the vertex without intravenous contrast. RADIATION DOSE REDUCTION: This exam was performed according to the departmental dose-optimization program which includes automated exposure control, adjustment of the mA and/or kV according to patient size and/or use of iterative reconstruction technique. COMPARISON:  12/26/2018 FINDINGS: Brain: No acute intracranial findings are seen. There are no signs of bleeding within the cranium. There is old infarct in the right parietal cortex. Cortical sulci are prominent. There is decreased density in periventricular and subcortical white matter. Vascular: Scattered arterial calcifications are seen. Skull: No acute findings are seen. Sinuses/Orbits: There is marked mucosal thickening in frontal, ethmoid, sphenoid and maxillary sinuses. Other: No significant interval changes are noted. IMPRESSION: No acute intracranial findings are seen in noncontrast CT brain. Old infarcts are seen in the right cerebral hemisphere. Atrophy. Small-vessel disease. Chronic pansinusitis. Electronically Signed   By: Elmer Picker M.D.   On: 04/13/2022 12:22   DG Chest Port 1  View  Result Date: 04/13/2022 CLINICAL DATA:  Weakness EXAM: PORTABLE CHEST 1 VIEW  COMPARISON:  Radiograph 03/04/2021 FINDINGS: Unchanged cardiomediastinal silhouette. Left basilar scarring/subsegmental atelectasis. Faint right medial basilar opacities. No pleural effusion or evidence of pneumothorax. No acute osseous abnormality. IMPRESSION: Faint right medial basilar opacities, could reflect atelectasis or infection. Left basilar scarring/subsegmental atelectasis. Electronically Signed   By: Maurine Simmering M.D.   On: 04/13/2022 11:22    EKG: Independently reviewed.  Sinus rhythm at 88 bpm.  Nonspecific T wave flattening.  Assessment/Plan Active Problems:   Hyperlipidemia   Essential hypertension   Paroxysmal atrial fibrillation (HCC)   Stage 3a chronic kidney disease (CKD) (HCC)   Anxiety state   CAD S/P OM PCI 2010   Mild persistent asthma without complication   GERD   History of CVA (cerebrovascular accident)   Chronic diastolic heart failure (HCC)   BPH associated with nocturia   CAP (community acquired pneumonia)   Continue acquired pneumonia > Presented with generalized weakness in the setting of recent stent COVID few weeks ago. > Found to have fever to one 1.1 and leukocytosis 17.5 and subtle opacities on right on chest x-ray.  No urinary symptoms and urinalysis pending.  No other suspected sources for infection. > Patient started on ceftriaxone and doxycycline for.  History of allergy to azithromycin and QT prolongation. - Monitor on MedSurg with continuous pulse ox - Continue ceftriaxone and doxycycline - Trend fever curve and WBC  CKD 3a > Creatinine stable in ED - Trend renal function and electrolytes  Atrial fibrillation - Continue home Tikosyn and Eliquis  Asthma - Continue home annuity Ellipta  Hyperlipidemia History of CVA - Continue home atorvastatin  Hypertension - Not currently on any antihypertensives  QT prolongation > History of this, avoiding  azithromycin.  QTc currently okay at 461. - Continue to monitor  CAD - Continue home atorvastatin - On Eliquis  Diastolic CHF > Last echo in 2019 showed EF> 60-65%, G1 DD.  Not currently on a diuretic. - Continue to monitor  GERD - Continue home PPI  Anxiety - Continue home Wellbutrin, fluoxetine, Ativan  DVT prophylaxis: Eliquis Code Status:   Full  Family Communication:  None on admission  Disposition Plan:   Patient is from:  Home  Anticipated DC to:  Home  Anticipated DC date:  1 to 2 days  Anticipated DC barriers: None  Consults called:  None Admission status:  Observation, MedSurg  Severity of Illness: The appropriate patient status for this patient is OBSERVATION. Observation status is judged to be reasonable and necessary in order to provide the required intensity of service to ensure the patient's safety. The patient's presenting symptoms, physical exam findings, and initial radiographic and laboratory data in the context of their medical condition is felt to place them at decreased risk for further clinical deterioration. Furthermore, it is anticipated that the patient will be medically stable for discharge from the hospital within 2 midnights of admission.    Marcelyn Bruins MD [facility]  How to contact the [facility] Attending or Consulting provider [place] or covering provider during after hours [place], for this patient?   Check the care team in [facility] and look for a) attending/consulting TRH provider listed and b) the [facility] team listed Log into [email or web address] and use [place]'s universal password to access. If you do not have the password, please contact the hospital operator. Locate the [facility] provider you are looking for under [facility] and page to a number that you can be directly reached. If you still have difficulty reaching  the provider, please page the [facility] (Director on Call) for the Hospitalists listed on amion for assistance.  04/13/2022, 2:59 PM

## 2022-04-13 NOTE — ED Notes (Signed)
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 Yes   Triage Complete: Triage complete  Chief Complaint CAP (community acquired pneumonia) [J18.9]  Triage Note Pt from home with ems, had COVID 3 weeks ago with cough still present. Pt c.o generalized weakness and unsteady gait. Denies SOB/CP. In a fib en route.  O2 94% room air Rhonchi lung sounds Denies n/v/d. 152/75 P89 rr21 cap 31 CBG 132   Allergies Allergies  Allergen Reactions   Tylenol [Acetaminophen] Rash   Azithromycin Other (See Comments)    Causes Afib   Advil [Ibuprofen] Nausea Only and Rash   Asa [Aspirin] Rash    Level of Care/Admitting Diagnosis ED Disposition     ED Disposition  Admit   Condition  --   Comment  Hospital Area: [place] [[id number]]  Level of Care: Med-Surg [16]  May place patient in observation at [facility] or [place] if equivalent level of care is available:: No  Covid Evaluation: Asymptomatic - no recent exposure (last 10 days) testing not required  Diagnosis: CAP (community acquired pneumonia) [id number]  Admitting Physician: Marcelyn Bruins [id number]  Attending Physician: Marcelyn Bruins [id number]          B Medical/Surgery History Past Medical History:  Diagnosis Date   Acute blood loss anemia 08/27/2021   Acute hemorrhagic infarction of brain [facility])    Allergic rhinitis    Anxiety    Asthma    Atrial fibrillation ([place])    CAD (coronary artery disease)    CHF (congestive heart failure) ([place])    Depression    Diverticulosis 03/16/1995   DJD (degenerative joint disease)    "most joints" (03/11/2015)   GERD (gastroesophageal reflux disease) 12/30/2000   Heart attack ([place]) 1984   Hiatal hernia 12/30/2000    History of blood transfusion 1988   "related to GI bleeding OR"   History of duodenal ulcer 10/15/1986   Hyperlipidemia    Hypertension    ICH (intracerebral hemorrhage) ([place])    Sepsis ([place]) 12/2018   Sinus bradycardia    Stroke [facility])    Past Surgical History:  Procedure Laterality Date   CARDIAC CATHETERIZATION     CATARACT EXTRACTION W/ INTRAOCULAR LENS  IMPLANT, BILATERAL Bilateral ?2013   [place]  2010   hospital ccu  1984   heart attack, hypoplastic right coronary   INGUINAL HERNIA REPAIR Right ~ [place] Bilateral 06/10/2017   Procedure: BILATERAL ORCHIECTOMY;  Surgeon: Franchot Gallo, MD;  Location[facility];  Service: Urology;  Laterality: Bilateral;  MAC ANESTHESIA AND LOCAL   TONSILLECTOMY     TOTAL KNEE ARTHROPLASTY Bilateral 1998-2004   "right-left"   VAGOTOMY AND PYLOROPLASTY  1988   "bleeding duodenal ulcer"     A IV Location/Drains/Wounds Patient Lines/Drains/Airways Status     Active Line/Drains/Airways     Name Placement date Placement time Site Days   Peripheral IV 04/13/22 20 G Right Antecubital 04/13/22  1106  Antecubital  less than 1            Intake/Output Last 24 hours  Intake/Output Summary (Last 24 hours) at 04/13/2022 1509 Last data filed at  04/13/2022 1317 Gross per 24 hour  Intake 446.86 ml  Output --  Net 446.86 ml    Labs/Imaging Results for orders placed or performed during the hospital encounter of 04/13/22 (from the past 48 hour(s))  CBC     Status: Abnormal   Collection Time: 04/13/22 11:01 AM  Result Value Ref Range   WBC 17.5 (H) 4.0 - 10.5 K/uL   RBC 4.01 (L) 4.22 - 5.81 MIL/uL   Hemoglobin 11.7 (L) 13.0 - 17.0 g/dL   HCT 37.6 (L) 39.0 - 52.0 %   MCV 93.8 80.0 - 100.0 fL   MCH 29.2 26.0 - 34.0 pg   MCHC 31.1 30.0 - 36.0 g/dL   RDW 14.2 11.5 - 15.5 %   Platelets 209 150 - 400 K/uL   nRBC 0.0 0.0 - 0.2 %     Comment: Performed at [facility], [place] [address], [place], [place] [address]  Comprehensive metabolic panel     Status: Abnormal   Collection Time: 04/13/22 11:01 AM  Result Value Ref Range   Sodium 133 (L) 135 - 145 mmol/L   Potassium 4.4 3.5 - 5.1 mmol/L   Chloride 101 98 - 111 mmol/L   CO2 23 22 - 32 mmol/L   Glucose, Bld 116 (H) 70 - 99 mg/dL    Comment: Glucose reference range applies only to samples taken after fasting for at least 8 hours.   BUN 23 8 - 23 mg/dL   Creatinine, Ser 1.11 0.61 - 1.24 mg/dL   Calcium 8.5 (L) 8.9 - 10.3 mg/dL   Total Protein 6.5 6.5 - 8.1 g/dL   Albumin 3.5 3.5 - 5.0 g/dL   AST 16 15 - 41 U/L   ALT 12 0 - 44 U/L   Alkaline Phosphatase 61 38 - 126 U/L   Total Bilirubin 0.7 0.3 - 1.2 mg/dL   GFR, Estimated >60 >60 mL/min    Comment: (NOTE) Calculated using the CKD-EPI Creatinine Equation (2021)    Anion gap 9 5 - 15    Comment: Performed at [facility] [address], [place], [place] [address]  Troponin I (High Sensitivity)     Status: None   Collection Time: 04/13/22 11:01 AM  Result Value Ref Range   Troponin I (High Sensitivity) 7 <18 ng/L    Comment: (NOTE) Elevated high sensitivity troponin I (hsTnI) values and significant  changes across serial measurements may suggest ACS but many other  chronic and acute conditions are known to elevate hsTnI results.  Refer to the "Links" section for chest pain algorithms and additional  guidance. Performed at [facility], [place] [address] [place], [place] [address]   Lactic acid, plasma     Status: None   Collection Time: 04/13/22 11:01 AM  Result Value Ref Range   Lactic Acid, Venous 1.2 0.5 - 1.9 mmol/L    Comment: Performed at [facility] [address], [place], [place] [address]  Troponin I (High Sensitivity)     Status: None   Collection Time: 04/13/22 12:45 PM  Result Value Ref Range   Troponin I (High Sensitivity) 8 <18 ng/L    Comment: (NOTE) Elevated high  sensitivity troponin I (hsTnI) values and significant  changes across serial measurements may suggest ACS but many other  chronic and acute conditions are known to elevate hsTnI results.  Refer to the "Links" section for chest pain algorithms and additional  guidance. Performed at [facility],  [address], [place], [place] [address]    CT Head Wo Contrast  Result Date: 04/13/2022 CLINICAL DATA:  Headaches EXAM: CT HEAD WITHOUT CONTRAST TECHNIQUE: Contiguous axial images were obtained from the base of the skull through the vertex without intravenous contrast. RADIATION DOSE REDUCTION: This exam was performed according to the departmental dose-optimization program which includes automated exposure control, adjustment of the mA and/or kV according to patient size and/or use of iterative reconstruction technique. COMPARISON:  12/26/2018 FINDINGS: Brain: No acute intracranial findings are seen. There are no signs of bleeding within the cranium. There is old infarct in the right parietal cortex. Cortical sulci are prominent. There is decreased density in periventricular and subcortical white matter. Vascular: Scattered arterial calcifications are seen. Skull: No acute findings are seen. Sinuses/Orbits: There is marked mucosal thickening in frontal, ethmoid, sphenoid and maxillary sinuses. Other: No significant interval changes are noted. IMPRESSION: No acute intracranial findings are seen in noncontrast CT brain. Old infarcts are seen in the right cerebral hemisphere. Atrophy. Small-vessel disease. Chronic pansinusitis. Electronically Signed   By: Elmer Picker M.D.   On: 04/13/2022 12:22   DG Chest Port 1 View  Result Date: 04/13/2022 CLINICAL DATA:  Weakness EXAM: PORTABLE CHEST 1 VIEW COMPARISON:  Radiograph 03/04/2021 FINDINGS: Unchanged cardiomediastinal silhouette. Left basilar scarring/subsegmental atelectasis. Faint right medial basilar opacities. No pleural effusion or evidence of  pneumothorax. No acute osseous abnormality. IMPRESSION: Faint right medial basilar opacities, could reflect atelectasis or infection. Left basilar scarring/subsegmental atelectasis. Electronically Signed   By: Maurine Simmering M.D.   On: 04/13/2022 11:22    Pending Labs Unresulted Labs (From admission, onward)     Start     Ordered   04/14/22 0500  Comprehensive metabolic panel  Tomorrow morning,   R        04/13/22 1459   04/14/22 0500  CBC  Tomorrow morning,   R        04/13/22 1459   04/13/22 1428  Urinalysis, Routine w reflex microscopic -Urine, Clean Catch  Once,   URGENT       Question:  Specimen Source  Answer:  Urine, Clean Catch   04/13/22 1427   04/13/22 1043  Blood culture (routine x 2)  BLOOD CULTURE X 2,   R (with STAT occurrences)      04/13/22 1042   04/13/22 1041  Urinalysis, Routine w reflex microscopic -Urine, Clean Catch  Once,   URGENT       Question:  Specimen Source  Answer:  Urine, Clean Catch   04/13/22 1041            Vitals/Pain Today's Vitals   04/13/22 1029 04/13/22 1300 04/13/22 1323 04/13/22 1324  BP: 132/71 (!) 127/55    Pulse: 89 86    Resp: 15 18    Temp: 98.5 F (36.9 C)  (!) 101.1 F (38.4 C) (!) 101.1 F (38.4 C)  TempSrc: Oral  Rectal Rectal  SpO2: 95% 95%    Weight:      Height:      PainSc:        Isolation Precautions No active isolations  Medications Medications  cefTRIAXone (ROCEPHIN) 1 g in sodium chloride 0.9 % 100 mL IVPB (1 g Intravenous New Bag/Given 04/13/22 1450)  doxycycline (VIBRAMYCIN) 100 mg in sodium chloride 0.9 % 250 mL IVPB (has no administration in time range)  omeprazole (PRILOSEC OTC) EC tablet 20 mg (has no administration in time range)  lactase (LACTAID) tablet 3,000 Units (has  no administration in time range)  apixaban (ELIQUIS) tablet 5 mg (has no administration in time range)  tamsulosin (FLOMAX) capsule 0.4 mg (has no administration in time range)  Fluticasone Furoate AEPB 1 puff (has no administration in  time range)  polyethylene glycol 0.4% and propylene glycol 0.3% (SYSTANE) ophthalmic gel (has no administration in time range)  sodium chloride flush (NS) 0.9 % injection 3 mL (has no administration in time range)  polyethylene glycol (MIRALAX / GLYCOLAX) packet 17 g (has no administration in time range)  cefTRIAXone (ROCEPHIN) 1 g in sodium chloride 0.9 % 100 mL IVPB (has no administration in time range)  doxycycline (VIBRAMYCIN) 100 mg in sodium chloride 0.9 % 250 mL IVPB (has no administration in time range)  lactated ringers bolus 500 mL (0 mLs Intravenous Stopped 04/13/22 1317)    Mobility walks with person assist     Focused Assessments Cardiac Assessment Handoff:    Lab Results  Component Value Date   CKTOTAL 35 (L) 08/26/2021   No results found for: "DDIMER" Does the Patient currently have chest pain? No    R Recommendations: See Admitting Provider Note  Report given to:   Additional Notes:

## 2022-04-13 NOTE — Progress Notes (Unsigned)
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  The care coordination team was consulted for assistance with disease management and care coordination needs.    {CCMTELEPHONEFACETOFACE:21091510} for initial visit.  Recent office visits: ***  Recent consult visits: ***  Hospital visits: {Hospital DC Yes/No:25215}   Objective:  Lab Results  Component Value Date   CREATININE 1.11 04/13/2022   BUN 23 04/13/2022   GFR 64.31 01/07/2016   EGFR 49 (L) 12/08/2020   GFRNONAA >60 04/13/2022   GFRAA >60 10/17/2019   NA 133 (L) 04/13/2022   K 4.4 04/13/2022   CALCIUM 8.5 (L) 04/13/2022   CO2 23 04/13/2022   GLUCOSE 116 (H) 04/13/2022    Lab Results  Component Value Date/Time   HGBA1C 6.3 (H) 04/01/2015 05:45 AM   HGBA1C 6.2 (H) 06/20/2007 09:10 AM   GFR 64.31 01/07/2016 10:59 AM   GFR 64.03 09/17/2013 11:05 AM   MICROALBUR <0.7 12/02/2014 11:55 AM    Last diabetic Eye exam: No results found for: "HMDIABEYEEXA"  Last diabetic Foot exam: No results found for: "HMDIABFOOTEX"   Lab Results  Component Value Date   CHOL 79 02/22/2022   HDL 40 (L) 02/22/2022   LDLCALC 29 02/22/2022   TRIG 48 02/22/2022   CHOLHDL 2.0 02/22/2022       Latest Ref Rng & Units 04/13/2022   11:01 AM 08/26/2021   11:20 PM 03/04/2021    9:51 PM  Hepatic Function  Total Protein 6.5 - 8.1 g/dL 6.5  5.8  6.2   Albumin 3.5 - 5.0 g/dL 3.5  3.2  3.4   AST 15 - 41 U/L '16  14  25   '$ ALT 0 - 44 U/L '12  14  23   '$ Alk Phosphatase 38 - 126 U/L 61  47  69   Total Bilirubin 0.3 - 1.2 mg/dL 0.7  0.6  0.9     Lab Results  Component Value Date/Time   TSH 2.926 06/13/2019 10:23 AM   TSH 4.512 (H) 03/16/2019 02:29 PM   TSH 3.60  01/04/2017 02:50 PM   TSH 2.21 01/07/2016 10:59 AM       Latest Ref Rng & Units 04/13/2022   11:01 AM 02/22/2022    9:16 AM 11/09/2021   10:40 AM  CBC  WBC 4.0 - 10.5 K/uL 17.5  8.5  8.7   Hemoglobin 13.0 - 17.0 g/dL 11.7  11.3  11.4   Hematocrit 39.0 - 52.0 % 37.6  36.2  35.5   Platelets 150 - 400 K/uL 209  306  291     Lab Results  Component Value Date/Time   VITAMINB12 467 08/20/2011 09:30 AM    Clinical ASCVD: {YES/NO:21197} The ASCVD Risk score (Arnett DK, et al., 2019) failed to calculate for the following reasons:   The 2019 ASCVD risk score is only valid for ages 40 to 78   The patient has a prior MI or stroke diagnosis    ***Other: (CHADS2VASc if Afib, MMRC or CAT for COPD, ACT, DEXA)     12/16/2021   10:36 AM 09/08/2021   11:16 AM 08/31/2021   12:08 PM  Depression screen PHQ 2/9  Decreased Interest 0 0 0  Down, Depressed, Hopeless 1 0 0  PHQ - 2 Score 1 0 0  Altered sleeping 0  0  Tired, decreased energy 1  1  Change in appetite 0  0  Feeling bad or failure about yourself  0  0  Trouble concentrating 0  0  Moving slowly or fidgety/restless 0  0  Suicidal thoughts 0  0  PHQ-9 Score 2  1  Difficult doing work/chores   Not difficult at all     Social History   Tobacco Use  Smoking Status Never  Smokeless Tobacco Never   BP Readings from Last 3 Encounters:  04/13/22 (!) 109/54  02/22/22 (!) 140/70  12/16/21 (!) 140/58   Pulse Readings from Last 3 Encounters:  04/13/22 74  02/22/22 68  12/16/21 66   Wt Readings from Last 3 Encounters:  04/13/22 146 lb (66.2 kg)  02/22/22 147 lb 9.6 oz (67 kg)  12/16/21 148 lb (67.1 kg)   BMI Readings from Last 3 Encounters:  04/13/22 20.36 kg/m  02/22/22 21.33 kg/m  12/16/21 21.39 kg/m    Allergies  Allergen Reactions   Tylenol [Acetaminophen] Rash   Lactose Intolerance (Gi) Diarrhea and Nausea Only   Zithromax [Azithromycin] Other (See Comments)    Triggered A-fib   Advil [Ibuprofen] Nausea Only and  Rash   Asa [Aspirin] Rash    Medications Reviewed Today     Reviewed by Cicero Duck, CPhT (Pharmacy Technician) on 04/13/22 at 69  Med List Status: Complete   Medication Order Taking? Sig Documenting Provider Last Dose Status Informant  apixaban (ELIQUIS) 5 MG TABS tablet [id number] Yes Take 1 tablet (5 mg total) by mouth 2 (two) times daily. Larey Dresser, MD 04/13/2022 0830 Active Self, Pharmacy Records  atorvastatin (LIPITOR) 20 MG tablet [id number] Yes TAKE 1 TABLET BY MOUTH EVERY DAY  Patient taking differently: Take 20 mg by mouth at bedtime.   [name], MD 04/12/2022 Active Self, Pharmacy Records  beclomethasone (BECONASE-AQ) 42 MCG/SPRAY nasal spray [id number] Yes Place 1 spray into both nostrils daily as needed for allergies. [provider] Past Month Active Self, Pharmacy Records  buPROPion [facility] SR) 150 MG 12 hr tablet [id number] Yes TAKE 1 TABLET(150 MG) BY MOUTH DAILY  Patient taking differently: Take 150 mg by mouth daily.   [name], MD 04/13/2022 Active Self, Pharmacy Records  cetirizine (ZYRTEC) 10 MG tablet [id number] Yes Take 10 mg by mouth daily. [provider] 04/13/2022 Active Self  Cholecalciferol (VITAMIN D-3 PO) [id number] Yes Take 1 capsule by mouth at bedtime. [provider] 04/12/2022 Active Self  denosumab (PROLIA) 60 MG/ML SOSY injection [id number] Yes Inject 60 mg into the skin every 6 (six) months. [provider] 02/08/2022 Active Self, Pharmacy Records  dofetilide (TIKOSYN) 250 MCG capsule [id number] Yes TAKE 1 CAPSULE(250 MCG) BY MOUTH TWICE DAILY  Patient taking differently: Take 250 mcg by mouth 2 (two) times daily.   Larey Dresser, MD 04/13/2022 Active Self, Pharmacy Records  FLUoxetine (PROZAC) 10 MG capsule [id number] Yes TAKE 1 CAPSULE(10 MG) BY MOUTH DAILY  Patient taking differently: Take 10 mg by mouth daily.   [name], MD 04/13/2022 Active Self, Pharmacy Records  Fluticasone Furoate  (ARNUITY ELLIPTA) 200 MCG/ACT AEPB [id number] Yes INHALE 1 PUFF INTO THE LUNGS DAILY  Patient taking differently: Inhale 1 puff into the lungs at bedtime.   Brand Males, MD 04/12/2022 Active Self, Pharmacy Records  Lactase (LACTAID PO) [id number] Yes Take 1 tablet by mouth  3 (three) times daily with meals as needed (lactose intolerance). [provider] 04/12/2022 Active Self  LORazepam (ATIVAN) 1 MG tablet [id number] Yes TAKE 1/2 TO 1 TABLET(0.5 TO 1 MG) BY MOUTH AT BEDTIME AS NEEDED  Patient taking differently: Take 0.5-1 mg by mouth at bedtime as needed for anxiety.   [name], MD 04/12/2022 Active Self, Pharmacy Records           Med Note (COFFELL, Dionne Bucy   Tue Apr 13, 2022  5:33 PM) Pt states he took 0.'5mg'$  last night  omeprazole (PRILOSEC OTC) 20 MG tablet [id number] Yes Take 1 tablet (20 mg total) by mouth daily. [name], MD 04/13/2022 Active Self  Polyethyl Glycol-Propyl Glycol (SYSTANE OP) [id number] Yes Place 1 drop into both eyes 4 (four) times daily as needed (dry eye). [provider] Past Week Active Self  tamsulosin (FLOMAX) 0.4 MG CAPS capsule [id number] Yes TAKE 1 CAPSULE(0.4 MG) BY MOUTH DAILY  Patient taking differently: Take 0.4 mg by mouth at bedtime.   [name], MD 04/12/2022 Active Self, Pharmacy Records  traMADol (ULTRAM) 50 MG tablet [id number] No Take 1 tablet (50 mg total) by mouth every 6 (six) hours as needed (pain).  Patient not taking: Reported on 04/13/2022   [name], MD Not Taking Active Self, Pharmacy Records           Med Note (COFFELL, Dionne Bucy   Tue Apr 13, 2022  5:34 PM) Pt reports not taking in the last 30 days but keeps on hand as he does use it if needed.            SDOH:  (Social Determinants of Health) assessments and interventions performed: {yes/no:20286} SDOH Interventions    Flowsheet Row Patient Outreach Telephone from 09/08/2021 in [place] from 04/15/2021 in [place] at [place] from 03/08/2018 in [place] at [place] Interventions Intervention Not Indicated -- --  Housing Interventions -- Intervention Not Indicated --  Transportation Interventions Intervention Not Indicated Intervention Not Indicated --  Depression Interventions/Treatment  -- -- Patient refuses Treatment  Financial Strain Interventions -- Intervention Not Indicated --  Physical Activity Interventions -- Intervention Not Indicated --  Stress Interventions -- Intervention Not Indicated --  Social Connections Interventions -- Intervention Not Indicated --       Medication Assistance: {MEDASSISTANCEINFO:25044}  Medication Access: Within the past 30 days, how often has patient missed a dose of medication? *** Is a pillbox or other method used to improve adherence? {YES/NO:21197} Factors that may affect medication adherence? {CHL DESC; BARRIERS:21522} Are meds synced by current pharmacy? {YES/NO:21197} Are meds delivered by current pharmacy? {YES/NO:21197} Does patient experience delays in picking up medications due to transportation concerns? {YES/NO:21197}  [facility] Reviewed: Is patient disadvantaged to use [facility]?: {YES/NO:21197} Current Rx insurance plan: *** Name and location of Current pharmacy:  [facility] [place] [address] [place] [address] [place] [address] Phone: [phone] Fax: [place], [place] [address] Phone: [phone] Fax: [phone]  [facility] services reviewed with patient today?: {YES/NO:21197} Patient requests to transfer care to [facility]?: {YES/NO:21197} Reason patient declined  to change pharmacies: {  [place] patient preference:27474}  Compliance/Adherence/Medication fill history: Care Gaps: ***  Star-Rating Drugs: ***   Assessment/Plan   {CCM PHARMD DISEASE STATES:25130}  Maren Reamer Clinical Pharmacist [phone]

## 2022-04-13 NOTE — ED Provider Notes (Signed)
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  HPI [name] [name] is a 89 year old male who presents today with generalized weakness.  He reports that he had COVID 3 weeks ago.  He did not have any severe symptoms with this and states that he was fairly sick.  This morning he got up and was waiting for his personal trainer.  He drank some orange juice.  He began feeling generally weak.  He does have a history of paroxysmal atrial fibrillation and felt like he might of had some A-fib.  He also reports he may have had some unsteady gait.  Mild dyspnea although denies shortness of breath and route.    Home Medications Prior to Admission medications   Medication Sig Start Date End Date Taking? Authorizing Provider  apixaban (ELIQUIS) 5 MG TABS tablet Take 1 tablet (5 mg total) by mouth 2 (two) times daily. 02/22/22   Larey Dresser, MD  atorvastatin (LIPITOR) 20 MG tablet TAKE 1 TABLET BY MOUTH EVERY DAY 01/05/22   Burchette, Alinda Sierras, MD  beclomethasone (BECONASE-AQ) 42 MCG/SPRAY nasal spray Place 1 spray into both nostrils 2 (two) times daily.    [provider]  buPROPion (WELLBUTRIN SR) 150 MG 12 hr tablet TAKE 1 TABLET(150 MG) BY MOUTH DAILY 02/03/22   Burchette, Alinda Sierras, MD  cetirizine (ZYRTEC) 10 MG tablet Take 10 mg by mouth daily with supper.     [provider]  Cholecalciferol (VITAMIN D) 2000 units tablet Take 2,000 Units by mouth daily.    [provider]  denosumab (PROLIA) 60 MG/ML SOSY injection Inject 60 mg into the skin every 6 (six) months.    [provider]  dofetilide (TIKOSYN) 250 MCG capsule TAKE 1 CAPSULE(250 MCG) BY MOUTH TWICE DAILY 07/27/21   Larey Dresser, MD  FLUoxetine (PROZAC)  10 MG capsule TAKE 1 CAPSULE(10 MG) BY MOUTH DAILY 01/08/22   Burchette, Alinda Sierras, MD  Fluticasone Furoate (ARNUITY ELLIPTA) 200 MCG/ACT AEPB INHALE 1 PUFF INTO THE LUNGS DAILY 04/12/22   Brand Males, MD  Lactase (LACTAID PO) Take 1 tablet by mouth as needed (lactose intolerant).    [provider]  LORazepam (ATIVAN) 1 MG tablet TAKE 1/2 TO 1 TABLET(0.5 TO 1 MG) BY MOUTH AT BEDTIME AS NEEDED 03/31/22   Burchette, Alinda Sierras, MD  nitroGLYCERIN (NITROSTAT) 0.4 MG SL tablet Place 1 tablet (0.4 mg total) under the tongue every 5 (five) minutes x 3 doses as needed for chest pain. 01/04/17   [name], MD  omeprazole (PRILOSEC OTC) 20 MG tablet Take 1 tablet (20 mg total) by mouth daily. 09/17/13   [name], MD  Polyethyl Glycol-Propyl Glycol (SYSTANE OP) Place 1 drop into both eyes as needed (dry eye).    [provider]  tamsulosin (FLOMAX) 0.4 MG CAPS capsule TAKE 1 CAPSULE(0.4 MG) BY MOUTH DAILY 01/05/22   Burchette, Alinda Sierras, MD  traMADol (ULTRAM) 50 MG tablet Take 1 tablet (50 mg total) by mouth every 6 (six) hours as needed (pain). 12/16/21   Burchette, Alinda Sierras, MD      Allergies    Tylenol [acetaminophen], Azithromycin, Advil [ibuprofen], and Asa [aspirin]    Review of Systems  Review of Systems  Physical Exam Updated Vital Signs BP (!) 127/55   Pulse 86   Temp (!) 101.1 F (38.4 C) (Rectal)   Resp 18   Ht 1.803 m ('5\' 11"'$ )   Wt 66.2 kg   SpO2 95%   BMI 20.36 kg/m  Physical Exam Vitals reviewed.  Constitutional:      Appearance: Normal appearance.  HENT:     Head: Normocephalic.     Right Ear: External ear normal.     Left Ear: External ear normal.     Nose: Nose normal.     Mouth/Throat:     Pharynx: Oropharynx is clear.  Eyes:     Pupils: Pupils are equal, round, and reactive to light.  Cardiovascular:     Rate and Rhythm: Normal rate and regular rhythm.  Pulmonary:     Effort: Pulmonary effort is normal.     Breath sounds: Rhonchi present.      Comments: Some rhonchi noted at left base right base is clear No wheezes no rales Abdominal:     General: Bowel sounds are normal.     Palpations: Abdomen is soft.  Musculoskeletal:        General: Normal range of motion.     Cervical back: Normal range of motion.     Comments: Small wound right anterior lower leg Pulses intact   Skin:    General: Skin is warm and dry.     Capillary Refill: Capillary refill takes less than 2 seconds.  Neurological:     General: No focal deficit present.     Mental Status: He is alert.  Psychiatric:        Mood and Affect: Mood normal.     ED Results / Procedures / Treatments   Labs (all labs ordered are listed, but only abnormal results are displayed) Labs Reviewed  CBC - Abnormal; Notable for the following components:      Result Value   WBC 17.5 (*)    RBC 4.01 (*)    Hemoglobin 11.7 (*)    HCT 37.6 (*)    All other components within normal limits  COMPREHENSIVE METABOLIC PANEL - Abnormal; Notable for the following components:   Sodium 133 (*)    Glucose, Bld 116 (*)    Calcium 8.5 (*)    All other components within normal limits  CULTURE, BLOOD (ROUTINE X 2)  CULTURE, BLOOD (ROUTINE X 2)  LACTIC ACID, PLASMA  URINALYSIS, ROUTINE W REFLEX MICROSCOPIC  URINALYSIS, ROUTINE W REFLEX MICROSCOPIC  TROPONIN I (HIGH SENSITIVITY)  TROPONIN I (HIGH SENSITIVITY)    EKG EKG Interpretation  Date/Time:  Tuesday April 13 2022 10:51:33 EST Ventricular Rate:  88 PR Interval:  128 QRS Duration: 96 QT Interval:  381 QTC Calculation: 461 R Axis:   54 Text Interpretation: Normal sinus rhythm Borderline T abnormalities, diffuse leads No significant change since last tracing of 22 February 2022 Confirmed by [name] [name] [phone]) on 04/13/2022 11:19:01 AM  Radiology CT Head Wo Contrast  Result Date: 04/13/2022 CLINICAL DATA:  Headaches EXAM: CT HEAD WITHOUT CONTRAST TECHNIQUE: Contiguous axial images were obtained from the base of the skull  through the vertex without intravenous contrast. RADIATION DOSE REDUCTION: This exam was performed according to the departmental dose-optimization program which includes automated exposure control, adjustment of the mA and/or kV according to patient size and/or use of iterative reconstruction technique. COMPARISON:  12/26/2018 FINDINGS: Brain: No acute intracranial findings are seen. There are no signs of bleeding within  the cranium. There is old infarct in the right parietal cortex. Cortical sulci are prominent. There is decreased density in periventricular and subcortical white matter. Vascular: Scattered arterial calcifications are seen. Skull: No acute findings are seen. Sinuses/Orbits: There is marked mucosal thickening in frontal, ethmoid, sphenoid and maxillary sinuses. Other: No significant interval changes are noted. IMPRESSION: No acute intracranial findings are seen in noncontrast CT brain. Old infarcts are seen in the right cerebral hemisphere. Atrophy. Small-vessel disease. Chronic pansinusitis. Electronically Signed   By: Elmer Picker M.D.   On: 04/13/2022 12:22   DG Chest Port 1 View  Result Date: 04/13/2022 CLINICAL DATA:  Weakness EXAM: PORTABLE CHEST 1 VIEW COMPARISON:  Radiograph 03/04/2021 FINDINGS: Unchanged cardiomediastinal silhouette. Left basilar scarring/subsegmental atelectasis. Faint right medial basilar opacities. No pleural effusion or evidence of pneumothorax. No acute osseous abnormality. IMPRESSION: Faint right medial basilar opacities, could reflect atelectasis or infection. Left basilar scarring/subsegmental atelectasis. Electronically Signed   By: Maurine Simmering M.D.   On: 04/13/2022 11:22    Procedures Procedures  {Document cardiac monitor, telemetry assessment procedure when appropriate:1}  Medications Ordered in ED Medications  cefTRIAXone (ROCEPHIN) 1 g in sodium chloride 0.9 % 100 mL IVPB (has no administration in time range)  doxycycline (VIBRAMYCIN) 100 mg  in sodium chloride 0.9 % 250 mL IVPB (has no administration in time range)  lactated ringers bolus 500 mL (0 mLs Intravenous Stopped 04/13/22 1317)    ED Course/ Medical Decision Making/ A&P Clinical Course as of 04/13/22 1443  Tue Apr 13, 2022  1439 CBC reviewed interpreted significant for leukocytosis elevated at 17,500 [DR]  1439 Anemia hemoglobin 11.7 stable from prior Complete metabolic panel reviewed interpreted significant for mild hyponatremia at 133, mild hyperglycemia glucose 116 mild hypocalcemia [DR]  1439  with calcium 8.5 otherwise within normal limits [DR]  1441 Head CT reviewed and interpreted and significant for old infarcts in the right cerebral hemisphere No evidence of acute abnormality Chronic pansinusitis [DR]  [id number] X-[name] [name] reviewed interpreted significant for medial basilar opacities could possibly [DR]  1441  represent infection [DR]    Clinical Course User Index [DR] [name] Boss, MD   {   Click here for ABCD2, HEART and other calculatorsREFRESH Note before signing :1}                          Medical Decision Making Amount and/or Complexity of Data Reviewed Labs: ordered. Radiology: ordered.    89 year old male presents today with generalized weakness.  He was diagnosed with COVID 3 weeks ago but has not had significant problems with this.  Today he felt generally weak.  He presents here and has generalized weakness and fever.  He is hemodynamically stable.  He is evaluated here with x-Nastasia Kage, blood cultures, labs.  He has a leukocytosis of 17,000.  He has some probable early infiltrate on his chest x-Fotini Lemus.  Urinalysis is still pending. Rocephin and doxycycline given EKG with nonspecific ST changes {Document critical care time when appropriate:1} {Document review of labs and clinical decision tools ie heart score, Chads2Vasc2 etc:1}  {Document your independent review of radiology images, and any outside records:1} {Document your discussion with family members,  caretakers, and with consultants:1} {Document social determinants of health affecting pt's care:1} {Document your decision making why or why not admission, treatments were needed:1} Final Clinical Impression(s) / ED Diagnoses Final diagnoses:  None    Rx / DC Orders ED Discharge Orders     None

## 2022-04-14 DIAGNOSIS — Z9842 Cataract extraction status, left eye: Secondary | ICD-10-CM | POA: Diagnosis not present

## 2022-04-14 DIAGNOSIS — N401 Enlarged prostate with lower urinary tract symptoms: Secondary | ICD-10-CM | POA: Diagnosis present

## 2022-04-14 DIAGNOSIS — Z955 Presence of coronary angioplasty implant and graft: Secondary | ICD-10-CM | POA: Diagnosis not present

## 2022-04-14 DIAGNOSIS — J4530 Mild persistent asthma, uncomplicated: Secondary | ICD-10-CM | POA: Diagnosis present

## 2022-04-14 DIAGNOSIS — J189 Pneumonia, unspecified organism: Secondary | ICD-10-CM

## 2022-04-14 DIAGNOSIS — N1831 Chronic kidney disease, stage 3a: Secondary | ICD-10-CM | POA: Diagnosis present

## 2022-04-14 DIAGNOSIS — I480 Paroxysmal atrial fibrillation: Secondary | ICD-10-CM | POA: Diagnosis present

## 2022-04-14 DIAGNOSIS — I252 Old myocardial infarction: Secondary | ICD-10-CM | POA: Diagnosis not present

## 2022-04-14 DIAGNOSIS — Z8673 Personal history of transient ischemic attack (TIA), and cerebral infarction without residual deficits: Secondary | ICD-10-CM | POA: Diagnosis not present

## 2022-04-14 DIAGNOSIS — I2510 Atherosclerotic heart disease of native coronary artery without angina pectoris: Secondary | ICD-10-CM | POA: Diagnosis present

## 2022-04-14 DIAGNOSIS — K219 Gastro-esophageal reflux disease without esophagitis: Secondary | ICD-10-CM | POA: Diagnosis present

## 2022-04-14 DIAGNOSIS — Z9841 Cataract extraction status, right eye: Secondary | ICD-10-CM | POA: Diagnosis not present

## 2022-04-14 DIAGNOSIS — Z8616 Personal history of COVID-19: Secondary | ICD-10-CM | POA: Diagnosis not present

## 2022-04-14 DIAGNOSIS — Z79899 Other long term (current) drug therapy: Secondary | ICD-10-CM | POA: Diagnosis not present

## 2022-04-14 DIAGNOSIS — Z881 Allergy status to other antibiotic agents status: Secondary | ICD-10-CM | POA: Diagnosis not present

## 2022-04-14 DIAGNOSIS — R531 Weakness: Secondary | ICD-10-CM | POA: Diagnosis present

## 2022-04-14 DIAGNOSIS — I5032 Chronic diastolic (congestive) heart failure: Secondary | ICD-10-CM | POA: Diagnosis present

## 2022-04-14 DIAGNOSIS — I130 Hypertensive heart and chronic kidney disease with heart failure and stage 1 through stage 4 chronic kidney disease, or unspecified chronic kidney disease: Secondary | ICD-10-CM | POA: Diagnosis present

## 2022-04-14 DIAGNOSIS — Z961 Presence of intraocular lens: Secondary | ICD-10-CM | POA: Diagnosis present

## 2022-04-14 DIAGNOSIS — F411 Generalized anxiety disorder: Secondary | ICD-10-CM | POA: Diagnosis present

## 2022-04-14 DIAGNOSIS — Z825 Family history of asthma and other chronic lower respiratory diseases: Secondary | ICD-10-CM | POA: Diagnosis not present

## 2022-04-14 DIAGNOSIS — E785 Hyperlipidemia, unspecified: Secondary | ICD-10-CM | POA: Diagnosis present

## 2022-04-14 DIAGNOSIS — F32A Depression, unspecified: Secondary | ICD-10-CM | POA: Diagnosis present

## 2022-04-14 DIAGNOSIS — Z8249 Family history of ischemic heart disease and other diseases of the circulatory system: Secondary | ICD-10-CM | POA: Diagnosis not present

## 2022-04-14 DIAGNOSIS — D649 Anemia, unspecified: Secondary | ICD-10-CM | POA: Diagnosis present

## 2022-04-14 LAB — COMPREHENSIVE METABOLIC PANEL
ALT: 11 U/L (ref 0–44)
AST: 13 U/L — ABNORMAL LOW (ref 15–41)
Albumin: 2.8 g/dL — ABNORMAL LOW (ref 3.5–5.0)
Alkaline Phosphatase: 49 U/L (ref 38–126)
Anion gap: 8 (ref 5–15)
BUN: 20 mg/dL (ref 8–23)
CO2: 23 mmol/L (ref 22–32)
Calcium: 7.7 mg/dL — ABNORMAL LOW (ref 8.9–10.3)
Chloride: 102 mmol/L (ref 98–111)
Creatinine, Ser: 1.01 mg/dL (ref 0.61–1.24)
GFR, Estimated: >60 mL/min (ref >60)
Glucose, Bld: 114 mg/dL — ABNORMAL HIGH (ref 70–99)
Potassium: 3.7 mmol/L (ref 3.5–5.1)
Sodium: 133 mmol/L — ABNORMAL LOW (ref 135–145)
Total Bilirubin: 0.8 mg/dL (ref 0.3–1.2)
Total Protein: 5.5 g/dL — ABNORMAL LOW (ref 6.5–8.1)

## 2022-04-14 LAB — RESPIRATORY PANEL BY PCR

## 2022-04-14 LAB — CBC
HCT: 32.3 % — ABNORMAL LOW (ref 39.0–52.0)
Hemoglobin: 10.3 g/dL — ABNORMAL LOW (ref 13.0–17.0)
MCH: 29.3 pg (ref 26.0–34.0)
MCHC: 31.9 g/dL (ref 30.0–36.0)
MCV: 91.8 fL (ref 80.0–100.0)
Platelets: 167 10*3/uL (ref 150–400)
RBC: 3.52 MIL/uL — ABNORMAL LOW (ref 4.22–5.81)
RDW: 14.8 % (ref 11.5–15.5)
WBC: 15.3 10*3/uL — ABNORMAL HIGH (ref 4.0–10.5)
nRBC: 0 % (ref 0.0–0.2)

## 2022-04-14 MED ORDER — POTASSIUM CHLORIDE CRYS ER 20 MEQ PO TBCR
40.0000 meq | EXTENDED_RELEASE_TABLET | Freq: Once | ORAL | Status: AC
  Administered 2022-04-14: 40 meq via ORAL
  Filled 2022-04-14: qty 2

## 2022-04-14 NOTE — Hospital Course (Signed)
89 yo M CKD 3A, atrial fibrillation, asthma, hyperlipidemia, CVA, hypertension, BPH, CAD, diastolic CHF, QT prolongation, GERD, anxiety presenting with weakness/unsteady gait possible lightheadedness on 3/5 morning and also some shortness of breath that is mild and not persistent. Had  recent COVID 3 weeks ago and fairly sick with flulike symptoms.  In the ED was febrile, labs with leukocytosis and chest x-ray faint right middle lobe opacities representing pneumonia versus atelectasis, UA fairly normal, CT head no acute abnormality and stable chronic changes, placed on IV antibiotic and admitted for early pneumonia

## 2022-04-14 NOTE — TOC Initial Note (Addendum)
Transition of Care [facility]) - Initial/Assessment Note    Patient Details  Name: [name] SCHELLIN, MD MRN: [id number] Date of Birth: 1929-05-26  Transition of Care [facility]) CM/SW Contact:    Ninfa Meeker, RN Phone Number: 04/14/2022, 9:21 AM  Clinical Narrative:     Patient is 89 yr old male, from home, recently had Covid. Presented with fever, generalized weakness. Has community acquired Pneumonia.             Transition of Care [facility]) Department has reviewed patient and no TOC needs have been identified at this time. We will continue to monitor patient advancement through Interdisciplinary progressions and if new patient needs arise, please place a consult.     Patient Goals and CMS Choice            Expected Discharge Plan and Services                                              Prior Living Arrangements/Services                       Activities of Daily Living      Permission Sought/Granted                  Emotional Assessment              Admission diagnosis:  Weakness [R53.1] CAP (community acquired pneumonia) [J18.9] Febrile illness [R50.9] Patient Active Problem List   Diagnosis Date Noted   CAP (community acquired pneumonia) 04/13/2022   Spontaneous hemorrhage 08/27/2021   Stage 3a chronic kidney disease (CKD) ([place]) 08/27/2021   Orthostatic hypotension 08/27/2021   Prolonged QT interval 03/05/2021   Abnormal x-ray of pelvis 11/17 12/27/2018   BPH associated with nocturia 11/21/2017   Arthritis of carpometacarpal ([place]) joint of right thumb 07/06/2016   Right foot pain 02/11/2016   Abnormality of gait 11/27/2015   Pain in joint, ankle and foot 11/27/2015   History of asthma 10/23/2015   Chronic diastolic heart failure ([place])    Hypersensitivity reaction 04/01/2015   History of CVA (cerebrovascular accident) 03/31/2015   Visit for monitoring Tikosyn therapy 03/11/2015   Paroxysmal atrial fibrillation ([place])    Pulmonary  hypertension ([place]) 09/26/2014   Bradycardia 12/21/2012   CAD S/P OM PCI 2010 12/16/2008   Hyperlipidemia 06/20/2007   Anxiety state 06/20/2007   Essential hypertension 06/20/2007   Allergic rhinitis 06/20/2007   Mild persistent asthma without complication 0000000   GERD 06/20/2007   PCP:  [name], MD Pharmacy:   [facility] [place], [place] - [address] AT [facility] [address] [place] [address] Phone: [phone] Fax: [place], [place] [address] [place] [facility] [address] Phone: [phone] Fax: [phone]     Social Determinants of Health (SDOH) Social History: SDOH Screenings   Food Insecurity: No Food Insecurity (09/08/2021)  Housing: Low Risk  (04/09/2020)  Transportation Needs: No Transportation Needs (09/08/2021)  Alcohol Screen: Low Risk  (04/15/2021)  Depression (PHQ2-9): Low Risk  (12/16/2021)  Financial Resource Strain: Low Risk  (04/15/2021)  Physical Activity: Insufficiently Active (04/15/2021)  Social Connections: Socially Integrated (04/15/2021)  Stress: No Stress Concern Present (  04/15/2021)  Tobacco Use: Low Risk  (04/13/2022)   SDOH Interventions:     Readmission Risk Interventions     No data to display

## 2022-04-14 NOTE — Progress Notes (Signed)
Mobility Specialist - Progress Note   04/14/22 1231  Mobility  Activity Transferred from bed to chair  Level of Assistance Minimal assist, patient does 75% or more  Assistive Device Other (Comment) (HHA)  Activity Response Tolerated well  Mobility Referral Yes  $Mobility charge 1 Mobility   Pt was received in bed and agreeable. Pt was MinA throughout transfer. Pt was left in chair with all needs met.   [name] [name]  Mobility Specialist Please contact via Solicitor or Rehab office at [phone]

## 2022-04-14 NOTE — Progress Notes (Signed)
PROGRESS NOTE [name], MD  [id number] DOB: 15-Jul-1929 DOA: 04/13/2022 PCP: [name], MD  Brief Narrative/Hospital Course: 89 yo M CKD 3A, atrial fibrillation, asthma, hyperlipidemia, CVA, hypertension, BPH, CAD, diastolic CHF, QT prolongation, GERD, anxiety presenting with weakness/unsteady gait possible lightheadedness on 3/5 morning and also some shortness of breath that is mild and not persistent. Had  recent COVID 3 weeks ago and fairly sick with flulike symptoms.  In the ED was febrile, labs with leukocytosis and chest x-ray faint right middle lobe opacities representing pneumonia versus atelectasis, UA fairly normal, CT head no acute abnormality and stable chronic changes, placed on IV antibiotic and admitted for early pneumonia     Subjective: Seen this am Wife at bedside Feels better No more fever or cough No chest pain  Assessment and Plan: Principal Problem:   CAP (community acquired pneumonia) Active Problems:   Hyperlipidemia   Essential hypertension   Paroxysmal atrial fibrillation (HCC)   Stage 3a chronic kidney disease (CKD) ([place])   Anxiety state   CAD S/P OM PCI 2010   Mild persistent asthma without complication   GERD   History of CVA (cerebrovascular accident)   Chronic diastolic heart failure (HCC)   BPH associated with nocturia    Generalized weakness with shortness of breath leukocytosis Suspected community-acquired pneumonia: Presented with generalized weakness mild shortness of breath leukocytosis in the setting of recent COVID infection, labs with leukocytosis and also febrile. Continue with empiric CAP coverage.  Check respiratory virus panel.  Encourage ambulation today-pt declined PT as he was able to ambulate in the room this morning. Recent Labs  Lab 04/13/22 1101 04/14/22 0334  WBC 17.5* 15.3*  LATICACIDVEN 1.2  --    PAF: EKG 3/5 NSR, continue home Tikosyn and Eliquis Chronic normocytic anemia hemoglobin 10 to 11 g,  stable. CAD Hyperlipidemia History of CVA: Continue Lipitor, Eliquis  GERD continue PPI Anxiety disorder: Stable on Wellbutrin fluoxetine and Ativan Chronic diastolic CHF EF 60 to [id number] with G1 DD on last echo in 2019.  Euvolemic Asthma no wheezing on exam.  Continue home inhalers Hypertension BP well-controlled.  Not on meds CKD 3a: Renal function stable at baseline Recent Labs    05/27/21 0953 08/26/21 2320 08/28/21 0404 09/07/21 1051 02/22/22 0916 04/13/22 1101 04/14/22 0334  BUN 28* '23 18 23 22 23 20  '$ CREATININE 1.37* 1.19 1.14 1.10 1.03 1.11 1.01   DVT prophylaxis: eliquis Code Status:   Code Status: Full Code Family Communication: plan of care discussed with patient/wife  at bedside. Patient status is:  inpatient  because of pneumonia Level of care: Med-Surg   Dispo: The patient is from: home with wife             Anticipated disposition: home likely tomorrow Objective: Vitals last 24 hrs: Vitals:   04/13/22 2324 04/14/22 0415 04/14/22 0800 04/14/22 0837  BP: (!) 118/52 (!) 117/50 137/63   Pulse: 70 68 75 77  Resp: '20  18 18  '$ Temp: 97.8 F (36.6 C) 98.1 F (36.7 C) 98.3 F (36.8 C)   TempSrc: Oral Oral Oral   SpO2: 95% 96% 99% 96%  Weight:      Height:       Weight change:   Physical Examination: General exam: alert awake, older than stated age HEENT:Oral mucosa moist, Ear/Nose WNL grossly Respiratory system: bilaterally diminished BS, no use of accessory muscle Cardiovascular system: S1 & S2 +, No JVD. Gastrointestinal system: Abdomen soft,NT,ND, BS+ Nervous System:Alert, awake, moving  extremities. Extremities: LE edema neg,distal peripheral pulses palpable.  Skin: No rashes,no icterus. MSK: Normal muscle bulk,tone, power  Medications reviewed:  Scheduled Meds:  apixaban  5 mg Oral BID   atorvastatin  20 mg Oral QHS   budesonide  0.5 mg Nebulization BID   buPROPion  150 mg Oral Daily   dofetilide  250 mcg Oral BID   FLUoxetine  10 mg Oral Daily    pantoprazole  40 mg Oral Daily   sodium chloride flush  3 mL Intravenous Q12H   tamsulosin  0.4 mg Oral QHS   Continuous Infusions:  cefTRIAXone (ROCEPHIN)  IV 1 g (04/14/22 1001)   doxycycline (VIBRAMYCIN) IV Stopped (04/14/22 [id number])      Diet Order             Diet regular Room service appropriate? Yes; Fluid consistency: Thin  Diet effective now                  Intake/Output Summary (Last 24 hours) at 04/14/2022 1050 Last data filed at 04/14/2022 0706 Gross per 24 hour  Intake 981.55 ml  Output 150 ml  Net 831.55 ml   Net IO Since Admission: 831.55 mL [04/14/22 1050]  Wt Readings from Last 3 Encounters:  04/13/22 66.2 kg  02/22/22 67 kg  12/16/21 67.1 kg     Unresulted Labs (From admission, onward)     Start     Ordered   04/13/22 1428  Urinalysis, Routine w reflex microscopic -Urine, Clean Catch  Once,   URGENT       Question:  Specimen Source  Answer:  Urine, Clean Catch   04/13/22 1427          Data Reviewed: I have personally reviewed following labs and imaging studies CBC: Recent Labs  Lab 04/13/22 1101 04/14/22 0334  WBC 17.5* 15.3*  HGB 11.7* 10.3*  HCT 37.6* 32.3*  MCV 93.8 91.8  PLT 209 [id number]   Basic Metabolic Panel: Recent Labs  Lab 04/13/22 1101 04/14/22 0334  NA 133* 133*  K 4.4 3.7  CL 101 102  CO2 23 23  GLUCOSE 116* 114*  BUN 23 20  CREATININE 1.11 1.01  CALCIUM 8.5* 7.7*   GFR: Estimated Creatinine Clearance: 45.5 mL/min (by C-G formula based on SCr of 1.01 mg/dL). Liver Function Tests: Recent Labs  Lab 04/13/22 1101 04/14/22 0334  AST 16 13*  ALT 12 11  ALKPHOS 61 49  BILITOT 0.7 0.8  PROT 6.5 5.5*  ALBUMIN 3.5 2.8*  Thyroid Function Tests: No results for input(s): "TSH", "T4TOTAL", "FREET4", "T3FREE", "THYROIDAB" in the last 72 hours. Sepsis Labs: Recent Labs  Lab 04/13/22 1101  LATICACIDVEN 1.2    Recent Results (from the past 240 hour(s))  Blood culture (routine x 2)     Status: None (Preliminary  result)   Collection Time: 04/13/22 11:01 AM   Specimen: BLOOD  Result Value Ref Range Status   Specimen Description BLOOD SITE NOT SPECIFIED  Final   Special Requests   Final    BOTTLES DRAWN AEROBIC AND ANAEROBIC Blood Culture adequate volume   Culture   Final    NO GROWTH < 24 HOURS Performed at [facility], [place] [address], [place], [place] [address]    Report Status PENDING  Incomplete  Blood culture (routine x 2)     Status: None (Preliminary result)   Collection Time: 04/13/22  1:38 PM   Specimen: BLOOD  Result Value Ref Range Status   Specimen  Description BLOOD LEFT ANTECUBITAL  Final   Special Requests   Final    BOTTLES DRAWN AEROBIC AND ANAEROBIC Blood Culture adequate volume   Culture   Final    NO GROWTH < 24 HOURS Performed at [facility], [address], [place], [place] [address]    Report Status PENDING  Incomplete    Antimicrobials: Anti-infectives (From admission, onward)    Start     Dose/Rate Route Frequency Ordered Stop   04/14/22 1000  cefTRIAXone (ROCEPHIN) 1 g in sodium chloride 0.9 % 100 mL IVPB        1 g 200 mL/hr over 30 Minutes Intravenous Every 24 hours 04/13/22 1459     04/14/22 0500  doxycycline (VIBRAMYCIN) 100 mg in sodium chloride 0.9 % 250 mL IVPB        100 mg 125 mL/hr over 120 Minutes Intravenous Every 12 hours 04/13/22 1459     04/13/22 1445  doxycycline (VIBRAMYCIN) 100 mg in sodium chloride 0.9 % 250 mL IVPB        100 mg 125 mL/hr over 120 Minutes Intravenous  Once 04/13/22 1430 04/13/22 1800   04/13/22 1430  cefTRIAXone (ROCEPHIN) 1 g in sodium chloride 0.9 % 100 mL IVPB        1 g 200 mL/hr over 30 Minutes Intravenous  Once 04/13/22 1429 04/13/22 1547      Culture/Microbiology    Component Value Date/Time   SDES BLOOD LEFT ANTECUBITAL 04/13/2022 1338   SPECREQUEST  04/13/2022 1338    BOTTLES DRAWN AEROBIC AND ANAEROBIC Blood Culture adequate volume   CULT  04/13/2022 1338    NO GROWTH < 24 HOURS Performed  at [facility], [place] [address] [place], [place] [address]    REPTSTATUS PENDING 04/13/2022 1338  Radiology Studies: CT Head Wo Contrast  Result Date: 04/13/2022 CLINICAL DATA:  Headaches EXAM: CT HEAD WITHOUT CONTRAST TECHNIQUE: Contiguous axial images were obtained from the base of the skull through the vertex without intravenous contrast. RADIATION DOSE REDUCTION: This exam was performed according to the departmental dose-optimization program which includes automated exposure control, adjustment of the mA and/or kV according to patient size and/or use of iterative reconstruction technique. COMPARISON:  12/26/2018 FINDINGS: Brain: No acute intracranial findings are seen. There are no signs of bleeding within the cranium. There is old infarct in the right parietal cortex. Cortical sulci are prominent. There is decreased density in periventricular and subcortical white matter. Vascular: Scattered arterial calcifications are seen. Skull: No acute findings are seen. Sinuses/Orbits: There is marked mucosal thickening in frontal, ethmoid, sphenoid and maxillary sinuses. Other: No significant interval changes are noted. IMPRESSION: No acute intracranial findings are seen in noncontrast CT brain. Old infarcts are seen in the right cerebral hemisphere. Atrophy. Small-vessel disease. Chronic pansinusitis. Electronically Signed   By: Elmer Picker M.D.   On: 04/13/2022 12:22   DG Chest Port 1 View  Result Date: 04/13/2022 CLINICAL DATA:  Weakness EXAM: PORTABLE CHEST 1 VIEW COMPARISON:  Radiograph 03/04/2021 FINDINGS: Unchanged cardiomediastinal silhouette. Left basilar scarring/subsegmental atelectasis. Faint right medial basilar opacities. No pleural effusion or evidence of pneumothorax. No acute osseous abnormality. IMPRESSION: Faint right medial basilar opacities, could reflect atelectasis or infection. Left basilar scarring/subsegmental atelectasis. Electronically Signed   By: Maurine Simmering M.D.   On:  04/13/2022 11:22     LOS: 0 days   Antonieta Pert, MD [facility]  04/14/2022, 10:50 AM

## 2022-04-15 DIAGNOSIS — J189 Pneumonia, unspecified organism: Secondary | ICD-10-CM | POA: Diagnosis not present

## 2022-04-15 LAB — CBC
HCT: 31.2 % — ABNORMAL LOW (ref 39.0–52.0)
Hemoglobin: 10 g/dL — ABNORMAL LOW (ref 13.0–17.0)
MCH: 29.4 pg (ref 26.0–34.0)
MCHC: 32.1 g/dL (ref 30.0–36.0)
MCV: 91.8 fL (ref 80.0–100.0)
Platelets: 156 10*3/uL (ref 150–400)
RBC: 3.4 MIL/uL — ABNORMAL LOW (ref 4.22–5.81)
RDW: 14.6 % (ref 11.5–15.5)
WBC: 9 10*3/uL (ref 4.0–10.5)
nRBC: 0 % (ref 0.0–0.2)

## 2022-04-15 LAB — MAGNESIUM: Magnesium: 1.8 mg/dL (ref 1.7–2.4)

## 2022-04-15 LAB — BASIC METABOLIC PANEL
Anion gap: 4 — ABNORMAL LOW (ref 5–15)
BUN: 22 mg/dL (ref 8–23)
CO2: 22 mmol/L (ref 22–32)
Calcium: 7.7 mg/dL — ABNORMAL LOW (ref 8.9–10.3)
Chloride: 108 mmol/L (ref 98–111)
Creatinine, Ser: 0.96 mg/dL (ref 0.61–1.24)
GFR, Estimated: >60 mL/min (ref >60)
Glucose, Bld: 95 mg/dL (ref 70–99)
Potassium: 3.6 mmol/L (ref 3.5–5.1)
Sodium: 134 mmol/L — ABNORMAL LOW (ref 135–145)

## 2022-04-15 MED ORDER — CEFDINIR 300 MG PO CAPS
300.0000 mg | ORAL_CAPSULE | Freq: Two times a day (BID) | ORAL | Status: DC
  Administered 2022-04-15: 300 mg via ORAL
  Filled 2022-04-15 (×2): qty 1

## 2022-04-15 MED ORDER — CEFDINIR 300 MG PO CAPS: 300.0000 mg | ORAL_CAPSULE | Freq: Two times a day (BID) | ORAL | 0 refills | Status: AC

## 2022-04-15 MED ORDER — DOXYCYCLINE HYCLATE 100 MG PO TABS
100.0000 mg | ORAL_TABLET | Freq: Two times a day (BID) | ORAL | Status: DC
  Filled 2022-04-15: qty 1

## 2022-04-15 MED ORDER — DOXYCYCLINE MONOHYDRATE 100 MG PO TABS: 100.0000 mg | ORAL_TABLET | Freq: Two times a day (BID) | ORAL | 0 refills | Status: AC

## 2022-04-15 NOTE — Discharge Summary (Signed)
Physician Discharge Summary  [name], MD FE:[id number] DOB: 29-Dec-1929 DOA: 04/13/2022  PCP: [name], MD  Admit date: 04/13/2022 Discharge date: 04/15/2022 Recommendations for Outpatient Follow-up:  Follow up with PCP in 1 weeks-call for appointment Please obtain BMP/CBC in one week  Discharge Dispo: home Discharge Condition: Stable Code Status:   Code Status: Full Code Diet recommendation:  Diet Order             Diet regular Room service appropriate? Yes; Fluid consistency: Thin  Diet effective now                    Brief/Interim Summary: 89 yo M CKD 3A, atrial fibrillation, asthma, hyperlipidemia, CVA, hypertension, BPH, CAD, diastolic CHF, QT prolongation, GERD, anxiety presenting with weakness/unsteady gait possible lightheadedness on 3/5 morning and also some shortness of breath that is mild and not persistent. Had  recent COVID 3 weeks ago and fairly sick with flulike symptoms.  In the ED was febrile, labs with leukocytosis and chest x-ray faint right middle lobe opacities representing pneumonia versus atelectasis, UA fairly normal, CT head no acute abnormality and stable chronic changes, placed on IV antibiotic and admitted for early pneumonia   Patient is clinically improved with IV antibiotics.  Leukocytosis resolved no more shortness of breath fever or chills.  He feels well enough to go home today.  Will discharge him, will complete antibiotics, follow-up with PCP in a week  Discharge Diagnoses:  Principal Problem:   CAP (community acquired pneumonia) Active Problems:   Hyperlipidemia   Essential hypertension   Paroxysmal atrial fibrillation (HCC)   Stage 3a chronic kidney disease (CKD) ([place])   Anxiety state   CAD S/P OM PCI 2010   Mild persistent asthma without complication   GERD   History of CVA (cerebrovascular accident)   Chronic diastolic heart failure (HCC)   BPH associated with nocturia  Generalized weakness with shortness of breath  leukocytosis Community-acquired pneumonia: Patient is clinically improved now mobilizing well no weakness, leukocytosis resolved.  Respiratory virus panel negative.  Will discharge him on oral antibiotics to complete the course   PAF: EKG 3/5 NSR, continue home Tikosyn and Eliquis Chronic normocytic anemia  stable.  CAD Hyperlipidemia History of CVA: Stable, continue Lipitor, Eliquis   GERD continue PPI Anxiety disorder: Stable on Wellbutrin fluoxetine and Ativan Chronic diastolic CHF EF 60 to [id number] with G1 DD on last echo in 2019.  He is euvolemic Asthma no wheezing on exam.  Continue home inhalers Hypertension BP well-controlled.  Not on meds CKD 3a: Renal function stable at baseline  Consults: none Subjective: Alert awake oriented resting comfortably. Feels well enough and would like to go home today  Discharge Exam: Vitals:   04/15/22 0731 04/15/22 0813  BP:  126/63  Pulse: 74   Resp: 16 18  Temp:  (!) 97.5 F (36.4 C)  SpO2:  98%   General: Pt is alert, awake, not in acute distress Cardiovascular: RRR, S1/S2 +, no rubs, no gallops Respiratory: CTA bilaterally, no wheezing, no rhonchi Abdominal: Soft, NT, ND, bowel sounds + Extremities: no edema, no cyanosis  Discharge Instructions  Discharge Instructions     Discharge instructions   Complete by: As directed    Please follow up with PCP I n1 week  Please call call MD or return to ER for similar or worsening recurring problem that brought you to hospital or if any fever,nausea/vomiting,abdominal pain, uncontrolled pain, chest pain,  shortness of breath or any  other alarming symptoms.  Please follow-up your doctor as instructed in a week time and call the office for appointment.  Please avoid alcohol, smoking, or any other illicit substance and maintain healthy habits including taking your regular medications as prescribed.  You were cared for by a hospitalist during your hospital stay. If you have any questions  about your discharge medications or the care you received while you were in the hospital after you are discharged, you can call the unit and ask to speak with the hospitalist on call if the hospitalist that took care of you is not available.  Once you are discharged, your primary care physician will handle any further medical issues. Please note that NO REFILLS for any discharge medications will be authorized once you are discharged, as it is imperative that you return to your primary care physician (or establish a relationship with a primary care physician if you do not have one) for your aftercare needs so that they can reassess your need for medications and monitor your lab values   Increase activity slowly   Complete by: As directed       Allergies as of 04/15/2022       Reactions   Tylenol [acetaminophen] Rash   Lactose Intolerance (gi) Diarrhea, Nausea Only   Zithromax [azithromycin] Other (See Comments)   Triggered A-fib   Advil [ibuprofen] Nausea Only, Rash   Asa [aspirin] Rash        Medication List     TAKE these medications    apixaban 5 MG Tabs tablet Commonly known as: ELIQUIS Take 1 tablet (5 mg total) by mouth 2 (two) times daily.   Arnuity Ellipta 200 MCG/ACT Aepb Generic drug: Fluticasone Furoate INHALE 1 PUFF INTO THE LUNGS DAILY What changed: when to take this   atorvastatin 20 MG tablet Commonly known as: LIPITOR TAKE 1 TABLET BY MOUTH EVERY DAY What changed:  how much to take how to take this when to take this additional instructions   beclomethasone 42 MCG/SPRAY nasal spray Commonly known as: BECONASE-AQ Place 1 spray into both nostrils daily as needed for allergies.   buPROPion 150 MG 12 hr tablet Commonly known as: WELLBUTRIN SR TAKE 1 TABLET(150 MG) BY MOUTH DAILY What changed: See the new instructions.   cefdinir 300 MG capsule Commonly known as: OMNICEF Take 1 capsule (300 mg total) by mouth every 12 (twelve) hours for 5 days.    cetirizine 10 MG tablet Commonly known as: ZYRTEC Take 10 mg by mouth daily.   denosumab 60 MG/ML Sosy injection Commonly known as: PROLIA Inject 60 mg into the skin every 6 (six) months.   dofetilide 250 MCG capsule Commonly known as: TIKOSYN TAKE 1 CAPSULE(250 MCG) BY MOUTH TWICE DAILY What changed: See the new instructions.   doxycycline 100 MG tablet Commonly known as: ADOXA Take 1 tablet (100 mg total) by mouth 2 (two) times daily for 5 days.   FLUoxetine 10 MG capsule Commonly known as: PROZAC TAKE 1 CAPSULE(10 MG) BY MOUTH DAILY What changed: See the new instructions.   LACTAID PO Take 1 tablet by mouth 3 (three) times daily with meals as needed (lactose intolerance).   LORazepam 1 MG tablet Commonly known as: ATIVAN TAKE 1/2 TO 1 TABLET(0.5 TO 1 MG) BY MOUTH AT BEDTIME AS NEEDED What changed: See the new instructions.   omeprazole 20 MG tablet Commonly known as: PRILOSEC OTC Take 1 tablet (20 mg total) by mouth daily.   SYSTANE OP Place 1 drop  into both eyes 4 (four) times daily as needed (dry eye).   tamsulosin 0.4 MG Caps capsule Commonly known as: FLOMAX TAKE 1 CAPSULE(0.4 MG) BY MOUTH DAILY What changed: See the new instructions.   traMADol 50 MG tablet Commonly known as: ULTRAM Take 1 tablet (50 mg total) by mouth every 6 (six) hours as needed (pain).   VITAMIN D-3 PO Take 1 capsule by mouth at bedtime.        Follow-up Information     [name], MD Follow up in 1 week(s).   Specialty: Family Medicine Contact information: [place] [address] [phone]                Allergies  Allergen Reactions   Tylenol [Acetaminophen] Rash   Lactose Intolerance (Gi) Diarrhea and Nausea Only   Zithromax [Azithromycin] Other (See Comments)    Triggered A-fib   Advil [Ibuprofen] Nausea Only and Rash   Asa [Aspirin] Rash    The results of significant diagnostics from this hospitalization (including  imaging, microbiology, ancillary and laboratory) are listed below for reference.    Microbiology: Recent Results (from the past 240 hour(s))  Blood culture (routine x 2)     Status: None (Preliminary result)   Collection Time: 04/13/22 11:01 AM   Specimen: BLOOD  Result Value Ref Range Status   Specimen Description BLOOD SITE NOT SPECIFIED  Final   Special Requests   Final    BOTTLES DRAWN AEROBIC AND ANAEROBIC Blood Culture adequate volume   Culture   Final    NO GROWTH 2 DAYS Performed at [facility], [address], [place], [place] [address]    Report Status PENDING  Incomplete  Blood culture (routine x 2)     Status: None (Preliminary result)   Collection Time: 04/13/22  1:38 PM   Specimen: BLOOD  Result Value Ref Range Status   Specimen Description BLOOD LEFT ANTECUBITAL  Final   Special Requests   Final    BOTTLES DRAWN AEROBIC AND ANAEROBIC Blood Culture adequate volume   Culture   Final    NO GROWTH 2 DAYS Performed at [facility], [place] [address] [place], [place] [address]    Report Status PENDING  Incomplete  Respiratory (~20 pathogens) panel by PCR     Status: None   Collection Time: 04/14/22 11:00 AM   Specimen: Nasopharyngeal Swab; Respiratory  Result Value Ref Range Status   Adenovirus NOT DETECTED NOT DETECTED Final   Coronavirus 229E NOT DETECTED NOT DETECTED Final    Comment: (NOTE) The Coronavirus on the Respiratory Panel, DOES NOT test for the novel  Coronavirus (2019 nCoV)    Coronavirus HKU1 NOT DETECTED NOT DETECTED Final   Coronavirus NL63 NOT DETECTED NOT DETECTED Final   Coronavirus OC43 NOT DETECTED NOT DETECTED Final   Metapneumovirus NOT DETECTED NOT DETECTED Final   Rhinovirus / Enterovirus NOT DETECTED NOT DETECTED Final   Influenza A NOT DETECTED NOT DETECTED Final   Influenza B NOT DETECTED NOT DETECTED Final   Parainfluenza Virus 1 NOT DETECTED NOT DETECTED Final   Parainfluenza Virus 2 NOT DETECTED NOT DETECTED Final    Parainfluenza Virus 3 NOT DETECTED NOT DETECTED Final   Parainfluenza Virus 4 NOT DETECTED NOT DETECTED Final   Respiratory Syncytial Virus NOT DETECTED NOT DETECTED Final   Bordetella pertussis NOT DETECTED NOT DETECTED Final   Bordetella Parapertussis NOT DETECTED NOT DETECTED Final   Chlamydophila pneumoniae NOT DETECTED NOT DETECTED  Final   Mycoplasma pneumoniae NOT DETECTED NOT DETECTED Final    Comment: Performed at [facility], [place] [address], [place], [place] [address]    Procedures/Studies: CT Head Wo Contrast  Result Date: 04/13/2022 CLINICAL DATA:  Headaches EXAM: CT HEAD WITHOUT CONTRAST TECHNIQUE: Contiguous axial images were obtained from the base of the skull through the vertex without intravenous contrast. RADIATION DOSE REDUCTION: This exam was performed according to the departmental dose-optimization program which includes automated exposure control, adjustment of the mA and/or kV according to patient size and/or use of iterative reconstruction technique. COMPARISON:  12/26/2018 FINDINGS: Brain: No acute intracranial findings are seen. There are no signs of bleeding within the cranium. There is old infarct in the right parietal cortex. Cortical sulci are prominent. There is decreased density in periventricular and subcortical white matter. Vascular: Scattered arterial calcifications are seen. Skull: No acute findings are seen. Sinuses/Orbits: There is marked mucosal thickening in frontal, ethmoid, sphenoid and maxillary sinuses. Other: No significant interval changes are noted. IMPRESSION: No acute intracranial findings are seen in noncontrast CT brain. Old infarcts are seen in the right cerebral hemisphere. Atrophy. Small-vessel disease. Chronic pansinusitis. Electronically Signed   By: Elmer Picker M.D.   On: 04/13/2022 12:22   DG Chest Port 1 View  Result Date: 04/13/2022 CLINICAL DATA:  Weakness EXAM: PORTABLE CHEST 1 VIEW COMPARISON:  Radiograph 03/04/2021 FINDINGS:  Unchanged cardiomediastinal silhouette. Left basilar scarring/subsegmental atelectasis. Faint right medial basilar opacities. No pleural effusion or evidence of pneumothorax. No acute osseous abnormality. IMPRESSION: Faint right medial basilar opacities, could reflect atelectasis or infection. Left basilar scarring/subsegmental atelectasis. Electronically Signed   By: Maurine Simmering M.D.   On: 04/13/2022 11:22    Labs: BNP (last 3 results) Recent Labs    09/07/21 1051  BNP [id number]*   Basic Metabolic Panel: Recent Labs  Lab 04/13/22 1101 04/14/22 0334 04/15/22 0557  NA 133* 133* 134*  K 4.4 3.7 3.6  CL 101 102 108  CO2 '23 23 22  '$ GLUCOSE 116* 114* 95  BUN '23 20 22  '$ CREATININE 1.11 1.01 0.96  CALCIUM 8.5* 7.7* 7.7*  MG  --   --  1.8   Liver Function Tests: Recent Labs  Lab 04/13/22 1101 04/14/22 0334  AST 16 13*  ALT 12 11  ALKPHOS 61 49  BILITOT 0.7 0.8  PROT 6.5 5.5*  ALBUMIN 3.5 2.8*   No results for input(s): "LIPASE", "AMYLASE" in the last 168 hours. No results for input(s): "AMMONIA" in the last 168 hours. CBC: Recent Labs  Lab 04/13/22 1101 04/14/22 0334 04/15/22 0557  WBC 17.5* 15.3* 9.0  HGB 11.7* 10.3* 10.0*  HCT 37.6* 32.3* 31.2*  MCV 93.8 91.8 91.8  PLT 209 167 156   Cardiac Enzymes: No results for input(s): "CKTOTAL", "CKMB", "CKMBINDEX", "TROPONINI" in the last 168 hours. BNP: Invalid input(s): "POCBNP" CBG: No results for input(s): "GLUCAP" in the last 168 hours. D-Dimer No results for input(s): "DDIMER" in the last 72 hours. Hgb A1c No results for input(s): "HGBA1C" in the last 72 hours. Lipid Profile No results for input(s): "CHOL", "HDL", "LDLCALC", "TRIG", "CHOLHDL", "LDLDIRECT" in the last 72 hours. Thyroid function studies No results for input(s): "TSH", "T4TOTAL", "T3FREE", "THYROIDAB" in the last 72 hours.  Invalid input(s): "FREET3" Anemia work up No results for input(s): "VITAMINB12", "FOLATE", "FERRITIN", "TIBC", "IRON",  "RETICCTPCT" in the last 72 hours. Urinalysis    Component Value Date/Time   COLORURINE YELLOW 04/13/2022 1430   APPEARANCEUR CLEAR 04/13/2022 1430  LABSPEC 1.017 04/13/2022 1430   PHURINE 7.0 04/13/2022 1430   GLUCOSEU NEGATIVE 04/13/2022 1430   HGBUR SMALL (A) 04/13/2022 1430   HGBUR trace-lysed 07/18/2009 1022   BILIRUBINUR NEGATIVE 04/13/2022 1430   BILIRUBINUR N 01/04/2017 1524   KETONESUR NEGATIVE 04/13/2022 1430   PROTEINUR NEGATIVE 04/13/2022 1430   UROBILINOGEN 0.2 01/04/2017 1524   UROBILINOGEN 0.2 07/18/2009 1022   NITRITE NEGATIVE 04/13/2022 1430   LEUKOCYTESUR NEGATIVE 04/13/2022 1430   Sepsis Labs Recent Labs  Lab 04/13/22 1101 04/14/22 0334 04/15/22 0557  WBC 17.5* 15.3* 9.0   Microbiology Recent Results (from the past 240 hour(s))  Blood culture (routine x 2)     Status: None (Preliminary result)   Collection Time: 04/13/22 11:01 AM   Specimen: BLOOD  Result Value Ref Range Status   Specimen Description BLOOD SITE NOT SPECIFIED  Final   Special Requests   Final    BOTTLES DRAWN AEROBIC AND ANAEROBIC Blood Culture adequate volume   Culture   Final    NO GROWTH 2 DAYS Performed at [facility], [address] [place], [place] [address]    Report Status PENDING  Incomplete  Blood culture (routine x 2)     Status: None (Preliminary result)   Collection Time: 04/13/22  1:38 PM   Specimen: BLOOD  Result Value Ref Range Status   Specimen Description BLOOD LEFT ANTECUBITAL  Final   Special Requests   Final    BOTTLES DRAWN AEROBIC AND ANAEROBIC Blood Culture adequate volume   Culture   Final    NO GROWTH 2 DAYS Performed at [facility], [place] [address] [place], [place] [address]    Report Status PENDING  Incomplete  Respiratory (~20 pathogens) panel by PCR     Status: None   Collection Time: 04/14/22 11:00 AM   Specimen: Nasopharyngeal Swab; Respiratory  Result Value Ref Range Status   Adenovirus NOT DETECTED NOT DETECTED Final    Coronavirus 229E NOT DETECTED NOT DETECTED Final    Comment: (NOTE) The Coronavirus on the Respiratory Panel, DOES NOT test for the novel  Coronavirus (2019 nCoV)    Coronavirus HKU1 NOT DETECTED NOT DETECTED Final   Coronavirus NL63 NOT DETECTED NOT DETECTED Final   Coronavirus OC43 NOT DETECTED NOT DETECTED Final   Metapneumovirus NOT DETECTED NOT DETECTED Final   Rhinovirus / Enterovirus NOT DETECTED NOT DETECTED Final   Influenza A NOT DETECTED NOT DETECTED Final   Influenza B NOT DETECTED NOT DETECTED Final   Parainfluenza Virus 1 NOT DETECTED NOT DETECTED Final   Parainfluenza Virus 2 NOT DETECTED NOT DETECTED Final   Parainfluenza Virus 3 NOT DETECTED NOT DETECTED Final   Parainfluenza Virus 4 NOT DETECTED NOT DETECTED Final   Respiratory Syncytial Virus NOT DETECTED NOT DETECTED Final   Bordetella pertussis NOT DETECTED NOT DETECTED Final   Bordetella Parapertussis NOT DETECTED NOT DETECTED Final   Chlamydophila pneumoniae NOT DETECTED NOT DETECTED Final   Mycoplasma pneumoniae NOT DETECTED NOT DETECTED Final    Comment: Performed at [facility], [place]. [address] [place], [place] [address]  Time coordinating discharge: 25 minutes  SIGNED: Antonieta Pert, MD  [facility] 04/15/2022, 10:16 AM  If 7PM-7AM, please contact night-coverage [email or web address]

## 2022-04-15 NOTE — Progress Notes (Signed)
Patients IV noted to be leaking a few minutes after starting the IV antibiotics. Removed and while attempting a new IV or suggestion for one, the patient objects politely due to the fact that he is being discharged home today. He prefers if the MD can convert his treatment to oral. To hand over this info to the day RN too.

## 2022-04-15 NOTE — Progress Notes (Signed)
Paitent and family given dc instructions and verbalized understanding. NO PIV present. Patient already dressed.

## 2022-04-16 ENCOUNTER — Telehealth: Payer: Self-pay

## 2022-04-16 NOTE — Transitions of Care (Post Inpatient/ED Visit) (Signed)
   04/16/2022  Name: [name] BEBER, MD MRN: [id number] DOB: 02-09-1930  Today's TOC FU Call Status: Today's TOC FU Call Status:: Successful TOC FU Call Competed TOC FU Call Complete Date: 04/16/22  Transition Care Management Follow-up Telephone Call Date of Discharge: 04/15/22 Discharge Facility: [facility]) Type of Discharge: Inpatient Admission Primary Inpatient Discharge Diagnosis:: "weakness,CAP" How have you been since you were released from the hospital?: Better (Patient voices that he rested well last night and is feeling better. He vocies that his coughing is becoming "less frequent." He has had no fever or SOB. Appetite has been good. LBM was this morning.) Any questions or concerns?: No  Items Reviewed: Did you receive and understand the discharge instructions provided?: Yes Medications obtained and verified?: Yes (Medications Reviewed) Any new allergies since your discharge?: No Dietary orders reviewed?: Yes Type of Diet Ordered:: low salt/heart healthy Do you have support at home?: Yes People in Home: spouse Name of Support/Comfort Primary Source: [facility] and Equipment/Supplies: [place] Ordered?: NA Any new equipment or medical supplies ordered?: NA  Functional Questionnaire: Do you need assistance with bathing/showering or dressing?: No Do you need assistance with meal preparation?: No Do you need assistance with eating?: No Do you have difficulty maintaining continence: No Do you need assistance with getting out of bed/getting out of a chair/moving?: No Do you have difficulty managing or taking your medications?: No  Folllow up appointments reviewed: PCP Follow-up appointment confirmed?: Yes Date of PCP follow-up appointment?: 04/20/22 (care guide assisted with scheduling appt while patient on call with RN CM) Follow-up Provider: [facility] Follow-up appointment confirmed?: NA Do you need transportation to  your follow-up appointment?: No Do you understand care options if your condition(s) worsen?: Yes-patient verbalized understanding  SDOH Interventions Today    Flowsheet Row Most Recent Value  SDOH Interventions   Food Insecurity Interventions Intervention Not Indicated  Transportation Interventions Intervention Not Indicated      TOC Interventions Today    Flowsheet Row Most Recent Value  TOC Interventions   TOC Interventions Discussed/Reviewed TOC Interventions Discussed, Arranged PCP follow up within 7 days/Care Guide scheduled, S/S of infection      Interventions Today    Flowsheet Row Most Recent Value  Education Interventions   Education Provided Provided Education  [resp sx mgmt]  Provided Verbal Education On Nutrition, Medication, When to see the doctor  Nutrition Interventions   Nutrition Discussed/Reviewed Nutrition Discussed  Pharmacy Interventions   Pharmacy Dicussed/Reviewed Pharmacy Topics Discussed, Medications and their functions  Safety Interventions   Safety Discussed/Reviewed Safety Discussed       [facility]end [facility] Care Management Care Management Community Coordinator Direct Phone: [phone] Toll Free: [phone] Fax: [phone]

## 2022-04-18 LAB — CULTURE, BLOOD (ROUTINE X 2)
Culture: NO GROWTH
Culture: NO GROWTH
Special Requests: ADEQUATE
Special Requests: ADEQUATE

## 2022-04-19 ENCOUNTER — Ambulatory Visit (INDEPENDENT_AMBULATORY_CARE_PROVIDER_SITE_OTHER): Payer: PPO

## 2022-04-19 VITALS — Ht 69.75 in | Wt 148.0 lb

## 2022-04-19 DIAGNOSIS — Z0000 Encounter for general adult medical examination without abnormal findings: Secondary | ICD-10-CM

## 2022-04-19 NOTE — Patient Instructions (Addendum)
[name] [name] , Thank you for taking time to come for your Medicare Wellness Visit. I appreciate your ongoing commitment to your health goals. Please review the following plan we discussed and let me know if I can assist you in the future.   These are the goals we discussed:  Goals       No needs at this time (pt-stated)       Care Coordination Interventions: Reviewed medications with patient and discussed the purpose of his medications. Reviewed scheduled/upcoming provider appointments including upcoming appointments Screening for signs and symptoms of depression related to chronic disease state  Assessed social determinant of health barriers  Also verified [facility] has completed their initial home visit on Sunday. Pt aware and verified providers involved with his ongoing care with additional needs at this time. Pt very grateful for all the assistance provider.       Patient Stated (pt-stated)      Maintain health.      Patient Stated      None at this time       Stay Healthy (pt-stated)        This is a list of the screening recommended for you and due dates:  Health Maintenance  Topic Date Due   COVID-19 Vaccine (5 - 2023-24 season) 05/05/2022*   Medicare Annual Wellness Visit  04/19/2023   DTaP/Tdap/Td vaccine (4 - Td or Tdap) 01/12/2028   Pneumonia Vaccine  Completed   Flu Shot  Completed   Zoster (Shingles) Vaccine  Completed   HPV Vaccine  Aged Out  *Topic was postponed. The date shown is not the original due date.    Advanced directives: Please bring a copy of your health care power of attorney and living will to the office to be added to your chart at your convenience.   Conditions/risks identified: None  Next appointment: Follow up in one year for your annual wellness visit.   Preventive Care 34 Years and Older, Male  Preventive care refers to lifestyle choices and visits with your health care provider that can promote health and wellness. What does preventive  care include? A yearly physical exam. This is also called an annual well check. Dental exams once or twice a year. Routine eye exams. Ask your health care provider how often you should have your eyes checked. Personal lifestyle choices, including: Daily care of your teeth and gums. Regular physical activity. Eating a healthy diet. Avoiding tobacco and drug use. Limiting alcohol use. Practicing safe sex. Taking low doses of aspirin every day. Taking vitamin and mineral supplements as recommended by your health care provider. What happens during an annual well check? The services and screenings done by your health care provider during your annual well check will depend on your age, overall health, lifestyle risk factors, and family history of disease. Counseling  Your health care provider may ask you questions about your: Alcohol use. Tobacco use. Drug use. Emotional well-being. Home and relationship well-being. Sexual activity. Eating habits. History of falls. Memory and ability to understand (cognition). Work and work Statistician. Screening  You may have the following tests or measurements: Height, weight, and BMI. Blood pressure. Lipid and cholesterol levels. These may be checked every 5 years, or more frequently if you are over 49 years old. Skin check. Lung cancer screening. You may have this screening every year starting at age 59 if you have a 30-pack-year history of smoking and currently smoke or have quit within the past 15 years. Fecal occult  blood test (FOBT) of the stool. You may have this test every year starting at age 72. Flexible sigmoidoscopy or colonoscopy. You may have a sigmoidoscopy every 5 years or a colonoscopy every 10 years starting at age 57. Prostate cancer screening. Recommendations will vary depending on your family history and other risks. Hepatitis C blood test. Hepatitis B blood test. Sexually transmitted disease (STD) testing. Diabetes screening.  This is done by checking your blood sugar (glucose) after you have not eaten for a while (fasting). You may have this done every 1-3 years. Abdominal aortic aneurysm (AAA) screening. You may need this if you are a current or former smoker. Osteoporosis. You may be screened starting at age 73 if you are at high risk. Talk with your health care provider about your test results, treatment options, and if necessary, the need for more tests. Vaccines  Your health care provider may recommend certain vaccines, such as: Influenza vaccine. This is recommended every year. Tetanus, diphtheria, and acellular pertussis (Tdap, Td) vaccine. You may need a Td booster every 10 years. Zoster vaccine. You may need this after age 50. Pneumococcal 13-valent conjugate (PCV13) vaccine. One dose is recommended after age 70. Pneumococcal polysaccharide (PPSV23) vaccine. One dose is recommended after age 70. Talk to your health care provider about which screenings and vaccines you need and how often you need them. This information is not intended to replace advice given to you by your health care provider. Make sure you discuss any questions you have with your health care provider. Document Released: 02/21/2015 Document Revised: 10/15/2015 Document Reviewed: 11/26/2014 Elsevier Interactive Patient Education  2017 [place] Prevention in the Home Falls can cause injuries. They can happen to people of all ages. There are many things you can do to make your home safe and to help prevent falls. What can I do on the outside of my home? Regularly fix the edges of walkways and driveways and fix any cracks. Remove anything that might make you trip as you walk through a door, such as a raised step or threshold. Trim any bushes or trees on the path to your home. Use bright outdoor lighting. Clear any walking paths of anything that might make someone trip, such as rocks or tools. Regularly check to see if handrails  are loose or broken. Make sure that both sides of any steps have handrails. Any raised decks and porches should have guardrails on the edges. Have any leaves, snow, or ice cleared regularly. Use sand or salt on walking paths during winter. Clean up any spills in your garage right away. This includes oil or grease spills. What can I do in the bathroom? Use night lights. Install grab bars by the toilet and in the tub and shower. Do not use towel bars as grab bars. Use non-skid mats or decals in the tub or shower. If you need to sit down in the shower, use a plastic, non-slip stool. Keep the floor dry. Clean up any water that spills on the floor as soon as it happens. Remove soap buildup in the tub or shower regularly. Attach bath mats securely with double-sided non-slip rug tape. Do not have throw rugs and other things on the floor that can make you trip. What can I do in the bedroom? Use night lights. Make sure that you have a light by your bed that is easy to reach. Do not use any sheets or blankets that are too big for your bed. They should not  hang down onto the floor. Have a firm chair that has side arms. You can use this for support while you get dressed. Do not have throw rugs and other things on the floor that can make you trip. What can I do in the kitchen? Clean up any spills right away. Avoid walking on wet floors. Keep items that you use a lot in easy-to-reach places. If you need to reach something above you, use a strong step stool that has a grab bar. Keep electrical cords out of the way. Do not use floor polish or wax that makes floors slippery. If you must use wax, use non-skid floor wax. Do not have throw rugs and other things on the floor that can make you trip. What can I do with my stairs? Do not leave any items on the stairs. Make sure that there are handrails on both sides of the stairs and use them. Fix handrails that are broken or loose. Make sure that handrails  are as long as the stairways. Check any carpeting to make sure that it is firmly attached to the stairs. Fix any carpet that is loose or worn. Avoid having throw rugs at the top or bottom of the stairs. If you do have throw rugs, attach them to the floor with carpet tape. Make sure that you have a light switch at the top of the stairs and the bottom of the stairs. If you do not have them, ask someone to add them for you. What else can I do to help prevent falls? Wear shoes that: Do not have high heels. Have rubber bottoms. Are comfortable and fit you well. Are closed at the toe. Do not wear sandals. If you use a stepladder: Make sure that it is fully opened. Do not climb a closed stepladder. Make sure that both sides of the stepladder are locked into place. Ask someone to hold it for you, if possible. Clearly mark and make sure that you can see: Any grab bars or handrails. First and last steps. Where the edge of each step is. Use tools that help you move around (mobility aids) if they are needed. These include: Canes. Walkers. Scooters. Crutches. Turn on the lights when you go into a dark area. Replace any light bulbs as soon as they burn out. Set up your furniture so you have a clear path. Avoid moving your furniture around. If any of your floors are uneven, fix them. If there are any pets around you, be aware of where they are. Review your medicines with your doctor. Some medicines can make you feel dizzy. This can increase your chance of falling. Ask your doctor what other things that you can do to help prevent falls. This information is not intended to replace advice given to you by your health care provider. Make sure you discuss any questions you have with your health care provider. Document Released: 11/21/2008 Document Revised: 07/03/2015 Document Reviewed: 03/01/2014 Elsevier Interactive Patient Education  2017 [employer].

## 2022-04-19 NOTE — Progress Notes (Signed)
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  Review of Systems    Virtual Visit via Telephone Note  I connected with  [name] Spence, MD on 04/19/22 at  3:30 PM EDT by telephone and verified that I am speaking with the correct person using two identifiers.  Location: Patient: Home Provider: Office Persons participating in the virtual visit: patient/Nurse Health Advisor   I discussed the limitations, risks, security and privacy concerns of performing an evaluation and management service by telephone and the availability of in person appointments. The patient expressed understanding and agreed to proceed.  Interactive audio and video telecommunications were attempted between this nurse and patient, however failed, due to patient having technical difficulties OR patient did not have access to video capability.  We continued and completed visit with audio only.  Some vital signs may be absent or patient reported.   Criselda Peaches, [name] [name]  Cardiac Risk Factors include: advanced age (>62mn, >>62women);male gender;hypertension     Objective:    Today's Vitals   04/19/22 1528 04/19/22 1529  Weight: 148 lb (67.1 kg)   Height: 5' 9.75" (1.772 m)   PainSc:  0-No pain   Body mass index is 21.39 kg/m.     04/19/2022    3:36 PM 09/08/2021   11:17 AM 08/27/2021    7:00 PM 04/15/2021    3:41 PM 03/05/2021    8:23 AM 04/09/2020    9:37 AM 03/26/2019   11:32 AM  Advanced Directives  Does Patient Have a Medical Advance Directive? Yes Yes No Yes Yes Yes Yes  Type of AParamedicof APlanoLiving will HGypsumLiving will  HHaytiLiving will HCross CityLiving will HHastingsLiving will   Does patient want to make changes to medical advance directive?    No - Patient declined No - Patient declined  No - Patient declined   Copy of HLakeviewin Chart? No - copy requested   No - copy requested No - copy requested No - copy requested   Would patient like information on creating a medical advance directive?  No - Patient declined No - Patient declined        Current Medications (verified) Outpatient Encounter Medications as of 04/19/2022  Medication Sig   apixaban (ELIQUIS) 5 MG TABS tablet Take 1 tablet (5 mg total) by mouth 2 (two) times daily.   atorvastatin (LIPITOR) 20 MG tablet TAKE 1 TABLET BY MOUTH EVERY DAY (Patient taking differently: Take 20 mg by mouth at bedtime.)   beclomethasone (BECONASE-AQ) 42 MCG/SPRAY nasal spray Place 1 spray into both nostrils daily as needed for allergies.   buPROPion (WELLBUTRIN SR) 150 MG 12 hr tablet TAKE 1 TABLET(150 MG) BY MOUTH DAILY (Patient taking differently: Take 150 mg by mouth daily.)   cefdinir (OMNICEF) 300 MG capsule Take 1 capsule (300 mg total) by mouth every 12 (twelve) hours for 5 days.   cetirizine (ZYRTEC) 10 MG tablet Take 10 mg by mouth daily.   Cholecalciferol (VITAMIN D-3 PO) Take 1 capsule by mouth at bedtime.   denosumab (PROLIA) 60 MG/ML SOSY injection Inject 60 mg into the skin every 6 (six) months.   dofetilide (TIKOSYN) 250 MCG capsule TAKE 1 CAPSULE(250 MCG) BY MOUTH TWICE DAILY (Patient taking differently: Take 250 mcg by mouth 2 (two) times daily.)   doxycycline (ADOXA) 100 MG tablet  Take 1 tablet (100 mg total) by mouth 2 (two) times daily for 5 days.   FLUoxetine (PROZAC) 10 MG capsule TAKE 1 CAPSULE(10 MG) BY MOUTH DAILY (Patient taking differently: Take 10 mg by mouth daily.)   Fluticasone Furoate (ARNUITY ELLIPTA) 200 MCG/ACT AEPB INHALE 1 PUFF INTO THE LUNGS DAILY (Patient taking differently: Inhale 1 puff into the lungs at bedtime.)   Lactase (LACTAID PO) Take 1 tablet by mouth 3 (three) times daily with meals as needed (lactose intolerance).   LORazepam (ATIVAN) 1 MG tablet TAKE 1/2 TO 1 TABLET(0.5 TO 1 MG) BY MOUTH AT  BEDTIME AS NEEDED (Patient taking differently: Take 0.5-1 mg by mouth at bedtime as needed for anxiety.)   omeprazole (PRILOSEC OTC) 20 MG tablet Take 1 tablet (20 mg total) by mouth daily.   Polyethyl Glycol-Propyl Glycol (SYSTANE OP) Place 1 drop into both eyes 4 (four) times daily as needed (dry eye).   tamsulosin (FLOMAX) 0.4 MG CAPS capsule TAKE 1 CAPSULE(0.4 MG) BY MOUTH DAILY (Patient taking differently: Take 0.4 mg by mouth at bedtime.)   traMADol (ULTRAM) 50 MG tablet Take 1 tablet (50 mg total) by mouth every 6 (six) hours as needed (pain). (Patient not taking: Reported on 04/13/2022)   No facility-administered encounter medications on file as of 04/19/2022.    Allergies (verified) Tylenol [acetaminophen], Lactose intolerance (gi), Zithromax [azithromycin], Advil [ibuprofen], and Asa [aspirin]   History: Past Medical History:  Diagnosis Date   Acute blood loss anemia 08/27/2021   Acute hemorrhagic infarction of brain [facility])    Allergic rhinitis    Anxiety    Asthma    Atrial fibrillation (HCC)    CAD (coronary artery disease)    CHF (congestive heart failure) ([place])    Depression    Diverticulosis 03/16/1995   DJD (degenerative joint disease)    "most joints" (03/11/2015)   GERD (gastroesophageal reflux disease) 12/30/2000   Heart attack ([place]) 1984   Hiatal hernia 12/30/2000   History of blood transfusion 1988   "related to GI bleeding OR"   History of duodenal ulcer 10/15/1986   Hyperlipidemia    Hypertension    ICH (intracerebral hemorrhage) ([place])    Sepsis ([place]) 12/2018   Sinus bradycardia    Stroke [facility])    Past Surgical History:  Procedure Laterality Date   CARDIAC CATHETERIZATION     CATARACT EXTRACTION W/ INTRAOCULAR LENS  IMPLANT, BILATERAL Bilateral ?2013   [place] WITH STENT PLACEMENT  2010   hospital ccu  1984   heart attack, hypoplastic right coronary   INGUINAL HERNIA REPAIR Right ~ [place] Bilateral 06/10/2017   Procedure: BILATERAL ORCHIECTOMY;  Surgeon: Franchot Gallo, MD;  Location: [facility];  Service: Urology;  Laterality: Bilateral;  MAC ANESTHESIA AND LOCAL   TONSILLECTOMY     TOTAL KNEE ARTHROPLASTY Bilateral 1998-2004   "right-left"   VAGOTOMY AND PYLOROPLASTY  1988   "bleeding duodenal ulcer"   Family History  Problem Relation Age of Onset   Coronary artery disease Mother        deceased   Deep vein thrombosis Sister    Coronary artery disease Brother    Asthma Brother    Colon cancer Neg Hx    Social History   Socioeconomic History   Marital status: Married    Spouse name: Not on file   Number of children: 2   Years of education: Not on  file   Highest education level: Doctorate  Occupational History   Occupation: IT sales professional: RETIRED    Comment: radiologist  Tobacco Use   Smoking status: Never   Smokeless tobacco: Never  Vaping Use   Vaping Use: Never used  Substance and Sexual Activity   Alcohol use: No   Drug use: No   Sexual activity: Not Currently  Other Topics Concern   Not on file  Social History Narrative   Married. Regular exercise - yes.       03/26/2019: Married to Therapist, sports, lives in 3 story home, although lives on first level mostly. Has two grown children.      Enjoys golfing; drives to [place] with wife every winter for a few days, watches sports, following Merchandiser, retail, reads about WWII,    Social Determinants of Health   Financial Resource Strain: Low Risk  (04/19/2022)   Overall Financial Resource Strain (CARDIA)    Difficulty of Paying Living Expenses: Not hard at all  Food Insecurity: No Food Insecurity (04/19/2022)   Hunger Vital Sign    Worried About Running Out of Food in the Last Year: Never true    [place] in the Last Year: Never true  Transportation Needs: No Transportation Needs (04/16/2022)   PRAPARE - Hydrologist (Medical): No    Lack of Transportation  (Non-Medical): No  Physical Activity: Insufficiently Active (04/19/2022)   Exercise Vital Sign    Days of Exercise per Week: 4 days    Minutes of Exercise per Session: 30 min  Stress: No Stress Concern Present (04/19/2022)   [place]    Feeling of Stress : Not at all  Social Connections: [place] (04/19/2022)   Social Connection and Isolation Panel [NHANES]    Frequency of Communication with Friends and Family: More than three times a week    Frequency of Social Gatherings with Friends and Family: More than three times a week    Attends Religious Services: More than 4 times per year    Active Member of [employer] or Organizations: Yes    Attends Music therapist: More than 4 times per year    Marital Status: Married    Tobacco Counseling Counseling given: Not Answered   Clinical Intake:  Pre-visit preparation completed: No  Pain : No/denies pain Pain Score: 0-No pain     BMI - recorded: 21.39 Nutritional Status: BMI of 19-24  Normal Nutritional Risks: None Diabetes: No  How often do you need to have someone help you when you read instructions, pamphlets, or other written materials from your doctor or pharmacy?: 1 - Never  Diabetic?  No  Interpreter Needed?: No  Information entered by :: Rolene Arbour [name] [name]   Activities of Daily Living    04/19/2022    3:35 PM 08/27/2021    7:00 PM  In your present state of health, do you have any difficulty performing the following activities:  Hearing? 0 1  Vision? 0 0  Difficulty concentrating or making decisions? 0 0  Walking or climbing stairs? 0 0  Dressing or bathing? 0 0  Doing errands, shopping? 0 0  Preparing Food and eating ? N   Using the Toilet? N   In the past six months, have you accidently leaked urine? N   Do you have problems with loss of bowel control? N   Managing your Medications? N   Managing  your Finances? N    Housekeeping or managing your Housekeeping? N     Patient Care Team: [name], MD as PCP - General (Family Medicine) Franchot Gallo, MD as Consulting Physician (Urology) Evans Lance, MD as Consulting Physician (Cardiology) Shon Hough, MD as Consulting Physician (Ophthalmology)  Indicate any recent Medical Services you may have received from other than [facility] providers in the past year (date may be approximate).     Assessment:   This is a routine wellness examination for [facility].  Hearing/Vision screen Hearing Screening - Comments:: Wears hearing aids Vision Screening - Comments:: Wears rx glasses - up to date with routine eye exams with  Dr Katy Fitch  Dietary issues and exercise activities discussed: Exercise limited by: None identified   Goals Addressed               This Visit's Progress     Stay Healthy (pt-stated)         Depression Screen    04/19/2022    3:34 PM 12/16/2021   10:36 AM 09/08/2021   11:16 AM 08/31/2021   12:08 PM 04/15/2021    3:36 PM 04/09/2020    9:35 AM 03/26/2019   11:29 AM  PHQ 2/9 Scores  PHQ - 2 Score 0 1 0 0 0 0 0  PHQ- 9 Score  2  1       Fall Risk    04/19/2022    3:35 PM 12/16/2021   10:36 AM 04/15/2021    3:40 PM 04/10/2021    6:32 PM 04/09/2020    9:38 AM  Fall Risk   Falls in the past year? 0 0 0 0 0  Number falls in past yr: 0 0 0  0  Injury with Fall? 0 0 0  0  Risk for fall due to : No Fall Risks Other (Comment);No Fall Risks No Fall Risks  Impaired vision;Impaired balance/gait;Impaired mobility  Follow up Falls prevention discussed Falls evaluation completed   Falls prevention discussed    FALL RISK PREVENTION PERTAINING TO THE HOME:  Any stairs in or around the home? Yes  If so, are there any without handrails? No  Home free of loose throw rugs in walkways, pet beds, electrical cords, etc? Yes  Adequate lighting in your home to reduce risk of falls? Yes   ASSISTIVE DEVICES UTILIZED TO PREVENT FALLS:  Life  alert? No  Use of a cane, walker or w/c? No  Grab bars in the bathroom? Yes  Shower chair or bench in shower? Yes  Elevated toilet seat or a handicapped toilet? Yes   TIMED UP AND GO:  Was the test performed? No . Audio Visit    Cognitive Function:        04/19/2022    3:37 PM 04/15/2021    3:41 PM 04/09/2020    9:41 AM 03/26/2019   11:32 AM  6CIT Screen  What Year? 0 points 0 points 0 points 0 points  What month? 0 points 0 points 0 points 0 points  What time? 0 points 0 points  0 points  Count back from 20 0 points 0 points 0 points 0 points  Months in reverse 0 points 0 points 0 points 0 points  Repeat phrase 0 points 0 points 0 points 0 points  Total Score 0 points 0 points  0 points    Immunizations Immunization History  Administered Date(s) Administered   Fluad Quad(high Dose 65+) 10/17/2018   Influenza Split 10/23/2012, 10/02/2015  Influenza Whole 10/27/2007, 11/13/2008, 10/29/2009, 11/09/2010   Influenza, High Dose Seasonal PF 10/28/2016, 11/13/2020   Influenza,inj,Quad PF,6+ Mos 10/19/2017   Influenza-Unspecified 12/09/2013, 10/28/2014, 11/12/2019, 11/15/2021   PFIZER(Purple Top)SARS-COV-2 Vaccination 03/19/2019, 04/08/2019, 09/27/2019   Pfizer Covid-19 Vaccine Bivalent Booster 57yr & up 11/15/2021   Pneumococcal Conjugate-13 09/17/2013   Pneumococcal Polysaccharide-23 08/30/2000, 11/29/2005   Td 06/20/2007, 01/11/2018   Tdap 01/11/2018   Zoster Recombinat (Shingrix) 11/07/2017, 11/22/2017, 01/11/2018   Zoster, Live 06/20/2007    TDAP status: Up to date  Flu Vaccine status: Up to date  Pneumococcal vaccine status: Up to date  Covid-19 vaccine status: Completed vaccines  Qualifies for Shingles Vaccine? Yes   Zostavax completed Yes   Shingrix Completed?: Yes  Screening Tests Health Maintenance  Topic Date Due   COVID-19 Vaccine (5 - 2023-24 season) 05/05/2022 (Originally 01/10/2022)   Medicare Annual Wellness (AWV)  04/19/2023   DTaP/Tdap/Td (4 -  Td or Tdap) 01/12/2028   Pneumonia Vaccine 89 Years old  Completed   INFLUENZA VACCINE  Completed   Zoster Vaccines- Shingrix  Completed   HPV VACCINES  Aged Out    Health Maintenance  There are no preventive care reminders to display for this patient.   Colorectal cancer screening: No longer required.   Lung Cancer Screening: (Low Dose CT Chest recommended if Age 960-80years, 30 pack-year currently smoking OR have quit w/in 15years.) does not qualify.     Additional Screening:  Hepatitis C Screening: does not qualify; Completed   Vision Screening: Recommended annual ophthalmology exams for early detection of glaucoma and other disorders of the eye. Is the patient up to date with their annual eye exam?  Yes  Who is the provider or what is the name of the office in which the patient attends annual eye exams? Dr GDelman CheadleIf pt is not established with a provider, would they like to be referred to a provider to establish care? No .   Dental Screening: Recommended annual dental exams for proper oral hygiene  Community Resource Referral / Chronic Care Management:  CRR required this visit?  No   CCM required this visit?  No      Plan:     I have personally reviewed and noted the following in the patient's chart:   Medical and social history Use of alcohol, tobacco or illicit drugs  Current medications and supplements including opioid prescriptions. Patient is not currently taking opioid prescriptions. Functional ability and status Nutritional status Physical activity Advanced directives List of other physicians Hospitalizations, surgeries, and ER visits in previous 12 months Vitals Screenings to include cognitive, depression, and falls Referrals and appointments  In addition, I have reviewed and discussed with patient certain preventive protocols, quality metrics, and best practice recommendations. A written personalized care plan for preventive services as well as  general preventive health recommendations were provided to patient.     BCriselda Peaches [name] [name]   [id number]  Nurse Notes: None

## 2022-04-20 ENCOUNTER — Encounter: Payer: Self-pay | Admitting: Family Medicine

## 2022-04-20 ENCOUNTER — Ambulatory Visit (INDEPENDENT_AMBULATORY_CARE_PROVIDER_SITE_OTHER): Payer: PPO | Admitting: Family Medicine

## 2022-04-20 VITALS — BP 160/72 | HR 75 | Temp 97.4°F | Ht 69.75 in | Wt 145.1 lb

## 2022-04-20 DIAGNOSIS — S01311A Laceration without foreign body of right ear, initial encounter: Secondary | ICD-10-CM

## 2022-04-20 DIAGNOSIS — J189 Pneumonia, unspecified organism: Secondary | ICD-10-CM

## 2022-04-20 DIAGNOSIS — Z8701 Personal history of pneumonia (recurrent): Secondary | ICD-10-CM | POA: Diagnosis not present

## 2022-04-20 DIAGNOSIS — I10 Essential (primary) hypertension: Secondary | ICD-10-CM

## 2022-04-20 NOTE — Progress Notes (Signed)
Established Patient Office Visit  Subjective   Patient ID: [name] Spence, MD, male    DOB: [birth year]  Age: 89 y.o. MRN: [id number]  Chief Complaint  Patient presents with   Hospitalization Follow-up    HPI   Dr. 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  Also seen for recent right ear injury which occurred last Friday at home.  See separate note below regarding that.  He had texted me on the day of admission with increasing weakness and unsteady gait.  His wife was concerned because he had some fever and seemed more short of breath and also cognitively impaired.  We recommended prompt evaluation at the ER which they did.  His chronic problems include history of chronic kidney disease, atrial fibrillation, asthma, hyperlipidemia, history of stroke, hypertension, CAD, GERD.  Presented with symptoms above in the setting of recent COVID 3 weeks ago.  In the ER had leukocytosis with white count 17.5 thousand along with fever.  Chest x-ray showed faint right middle lobe opacity with pneumonia versus atelectasis.  Urine unremarkable.  CT had no acute abnormalities.  Was given IV fluids and IV antibiotics.  His leukocytosis promptly resolved and he quickly became afebrile.  Was discharged on doxycycline and cefdinir and has now completed those.  No further fever.  Feels like he is close to being back to baseline at this time.  This past Friday he states he was trying to rush to tell his wife something before she got out the door and he apparently fell against his chest of drawers striking his right ear.  He had laceration of the superior aspect of the auricle with some significant bleeding which was controlled with pressure.  They have been cleaning this with soap and water and applying some Vaseline and wife had some Bactroban ointment she has used as well.  He has no pain.  There is no loss of consciousness.  No further bleeding.  No other injuries reported.  Wife is concerned that he tends to sit much of  the day and very little activity.  He states he has angina and even occasional claudication symptoms with prolonged walking.  He does have a Physiological scientist that comes out couple times per week to help him  Past Medical History:  Diagnosis Date   Acute blood loss anemia 08/27/2021   Acute hemorrhagic infarction of brain [facility])    Allergic rhinitis    Anxiety    Asthma    Atrial fibrillation (HCC)    CAD (coronary artery disease)    CHF (congestive heart failure) ([place])    Depression    Diverticulosis 03/16/1995   DJD (degenerative joint disease)    "most joints" (03/11/2015)   GERD (gastroesophageal reflux disease) 12/30/2000   Heart attack ([place]) 1984   Hiatal hernia 12/30/2000   History of blood transfusion 1988   "related to GI bleeding OR"   History of duodenal ulcer 10/15/1986   Hyperlipidemia    Hypertension    ICH (intracerebral hemorrhage) ([place])    Sepsis ([place]) 12/2018   Sinus bradycardia    Stroke [facility])    Past Surgical History:  Procedure Laterality Date   CARDIAC CATHETERIZATION     CATARACT EXTRACTION W/ INTRAOCULAR LENS  IMPLANT, BILATERAL Bilateral ?2013   [place] WITH STENT PLACEMENT  2010   hospital ccu  1984   heart attack, hypoplastic right coronary   INGUINAL HERNIA REPAIR Right ~ [place]  1972   ORCHIECTOMY Bilateral 06/10/2017   Procedure: BILATERAL ORCHIECTOMY;  Surgeon: Franchot Gallo, MD;  Location: [facility];  Service: Urology;  Laterality: Bilateral;  MAC ANESTHESIA AND LOCAL   TONSILLECTOMY     TOTAL KNEE ARTHROPLASTY Bilateral 1998-2004   "right-left"   VAGOTOMY AND PYLOROPLASTY  1988   "bleeding duodenal ulcer"    reports that he has never smoked. He has never used smokeless tobacco. He reports that he does not drink alcohol and does not use drugs. family history includes Asthma in his brother; Coronary artery disease in his brother and mother; Deep vein thrombosis in his  sister. Allergies  Allergen Reactions   Tylenol [Acetaminophen] Rash   Lactose Intolerance (Gi) Diarrhea and Nausea Only   Zithromax [Azithromycin] Other (See Comments)    Triggered A-fib   Advil [Ibuprofen] Nausea Only and Rash   Asa [Aspirin] Rash    Review of Systems  Constitutional:  Negative for chills and fever.  Respiratory:  Negative for cough, hemoptysis, sputum production and wheezing.   Cardiovascular:  Negative for chest pain.  Gastrointestinal:  Negative for abdominal pain.  Genitourinary:  Negative for dysuria.      Objective:     BP (!) 160/72   Pulse 75   Temp (!) 97.4 F (36.3 C) (Oral)   Ht 5' 9.75" (1.772 m)   Wt 145 lb 1.6 oz (65.8 kg)   SpO2 97%   BMI 20.97 kg/m    Physical Exam Vitals reviewed.  Constitutional:      General: He is not in acute distress. HENT:     Head:     Comments: He has laceration through the superior aspect of the auricle.  He has some dried blood inferior to this but no active bleeding.  No erythema or any purulent drainage.  No cellulitis changes.  Ear not warm to touch. Cardiovascular:     Rate and Rhythm: Normal rate and regular rhythm.  Pulmonary:     Comments: Somewhat diminished breath sounds throughout.  No wheezes or rales noted.  Pulse oximetry 97% room air Musculoskeletal:     Right lower leg: No edema.     Left lower leg: No edema.  Neurological:     Mental Status: He is alert.      No results found for any visits on 04/20/22.    The ASCVD Risk score (Arnett DK, et al., 2019) failed to calculate for the following reasons:   The 2019 ASCVD risk score is only valid for ages 59 to 66   The patient has a prior MI or stroke diagnosis    Assessment & Plan:   #1 recent community-acquired pneumonia right middle lobe.  Improved rapidly with IV fluids and antibiotics.  Is now off antibiotics and doing well near baseline.  His leukocytosis promptly resolved following antibiotics in the hospital.  We did  discuss follow-up labs today but did not see any indication as his white count had normalized and chemistries were stable at discharge and he is eating and drinking well at this time  #2 laceration involving right auricle.  No signs of secondary infection.  Clean daily with soap and water.  Consider applying a little bit of topical Vaseline.  Follow-up immediately for signs of secondary infection.  Carolann Littler, MD

## 2022-04-20 NOTE — Patient Instructions (Signed)
Keep ear clean daily with soap and water.    Watch for any signs of secondary infection such as increased redness or warmth.    Stay well hydrated.

## 2022-04-22 ENCOUNTER — Other Ambulatory Visit (HOSPITAL_COMMUNITY): Payer: Self-pay

## 2022-04-22 MED ORDER — DOFETILIDE 250 MCG PO CAPS: 250.0000 ug | ORAL_CAPSULE | Freq: Two times a day (BID) | ORAL | 1 refills | Status: DC

## 2022-04-26 ENCOUNTER — Other Ambulatory Visit: Payer: Self-pay | Admitting: Family Medicine

## 2022-04-26 MED ORDER — CEPHALEXIN 500 MG PO CAPS: 500.0000 mg | ORAL_CAPSULE | Freq: Three times a day (TID) | ORAL | 0 refills | Status: DC

## 2022-04-26 NOTE — Progress Notes (Signed)
Recent fall.  Injury to right ear.   Also reportedly  hit left ankle with hematoma and  now ?cellulitis changes of increased redness small zone around this.  Will send in Keflex 500 mg tid for 10 days.  Follow up in office for any fever or progressive redness.  [name] MD [facility]

## 2022-06-28 ENCOUNTER — Other Ambulatory Visit: Payer: Self-pay | Admitting: Family Medicine

## 2022-07-06 ENCOUNTER — Other Ambulatory Visit: Payer: Self-pay | Admitting: Family Medicine

## 2022-07-12 ENCOUNTER — Telehealth: Payer: Self-pay | Admitting: Internal Medicine

## 2022-07-12 MED ORDER — PREDNISONE 10 MG PO TABS: ORAL_TABLET | ORAL | 0 refills | Status: AC

## 2022-07-12 MED ORDER — DOXYCYCLINE HYCLATE 100 MG PO TABS: 100.0000 mg | ORAL_TABLET | Freq: Two times a day (BID) | ORAL | 0 refills | Status: DC

## 2022-07-12 NOTE — Telephone Encounter (Signed)
   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  He is known to have asthma.  For the last 3 days he has had wheezing cough and sputum that is yellow color but no fever.  This is more than baseline.  He requested antibiotic and prednisone.  PLAN Take doxycycline 100mg  po twice daily x 5 days; take after meals and avoid sunlight  Please take prednisone 40 mg x1 day, then 30 mg x1 day, then 20 mg x1 day, then 10 mg x1 day, and then 5 mg x1 day and stop     Allergies  Allergen Reactions   Tylenol [Acetaminophen] Rash   Lactose Intolerance (Gi) Diarrhea and Nausea Only   Zithromax [Azithromycin] Other (See Comments)    Triggered A-fib   Advil [Ibuprofen] Nausea Only and Rash   Asa [Aspirin] Rash    Current Outpatient Medications:    cephALEXin (KEFLEX) 500 MG capsule, Take 1 capsule (500 mg total) by mouth 3 (three) times daily., Disp: 30 capsule, Rfl: 0   apixaban (ELIQUIS) 5 MG TABS tablet, Take 1 tablet (5 mg total) by mouth 2 (two) times daily., Disp: 180 tablet, Rfl: 3   atorvastatin (LIPITOR) 20 MG tablet, TAKE 1 TABLET BY MOUTH EVERY DAY (Patient taking differently: Take 20 mg by mouth at bedtime.), Disp: 90 tablet, Rfl: 3   beclomethasone (BECONASE-AQ) 42 MCG/SPRAY nasal spray, Place 1 spray into both nostrils daily as needed for allergies., Disp: , Rfl:    buPROPion (WELLBUTRIN SR) 150 MG 12 hr tablet, TAKE 1 TABLET(150 MG) BY MOUTH DAILY (Patient taking differently: Take 150 mg by mouth daily.), Disp: 90 tablet, Rfl: 1   cetirizine (ZYRTEC) 10 MG tablet, Take 10 mg by mouth daily., Disp: , Rfl:    Cholecalciferol (VITAMIN D-3 PO), Take 1 capsule by mouth at bedtime., Disp: , Rfl:    denosumab (PROLIA) 60 MG/ML SOSY injection, Inject 60 mg into the skin every 6 (six) months., Disp: , Rfl:    dofetilide (TIKOSYN) 250 MCG capsule, Take 1 capsule (250 mcg total) by mouth 2 (two) times daily., Disp: 180 capsule, Rfl: 1   FLUoxetine (PROZAC) 10 MG capsule, TAKE 1 CAPSULE(10 MG) BY MOUTH  DAILY, Disp: 90 capsule, Rfl: 1   Fluticasone Furoate (ARNUITY ELLIPTA) 200 MCG/ACT AEPB, INHALE 1 PUFF INTO THE LUNGS DAILY (Patient taking differently: Inhale 1 puff into the lungs at bedtime.), Disp: 60 each, Rfl: 1   Lactase (LACTAID PO), Take 1 tablet by mouth 3 (three) times daily with meals as needed (lactose intolerance)., Disp: , Rfl:    LORazepam (ATIVAN) 1 MG tablet, TAKE 1/2 TO 1 TABLET(0.5 TO 1 MG) BY MOUTH AT BEDTIME AS NEEDED (Patient taking differently: Take 0.5-1 mg by mouth at bedtime as needed for anxiety.), Disp: 90 tablet, Rfl: 1   omeprazole (PRILOSEC OTC) 20 MG tablet, Take 1 tablet (20 mg total) by mouth daily., Disp: 100 tablet, Rfl: 4   Polyethyl Glycol-Propyl Glycol (SYSTANE OP), Place 1 drop into both eyes 4 (four) times daily as needed (dry eye)., Disp: , Rfl:    tamsulosin (FLOMAX) 0.4 MG CAPS capsule, TAKE 1 CAPSULE(0.4 MG) BY MOUTH DAILY, Disp: 90 capsule, Rfl: 1   traMADol (ULTRAM) 50 MG tablet, Take 1 tablet (50 mg total) by mouth every 6 (six) hours as needed (pain)., Disp: 30 tablet, Rfl: 1

## 2022-07-12 NOTE — Telephone Encounter (Signed)
Called and spoke with pt to verify preferred pharmacy and have sent pred Rx and doxy Rx to pharmacy for pt. Nothing further needed.

## 2022-07-20 DIAGNOSIS — C61 Malignant neoplasm of prostate: Secondary | ICD-10-CM | POA: Diagnosis not present

## 2022-07-30 ENCOUNTER — Other Ambulatory Visit: Payer: Self-pay | Admitting: Family Medicine

## 2022-07-30 DIAGNOSIS — C61 Malignant neoplasm of prostate: Secondary | ICD-10-CM | POA: Diagnosis not present

## 2022-07-30 DIAGNOSIS — M818 Other osteoporosis without current pathological fracture: Secondary | ICD-10-CM | POA: Diagnosis not present

## 2022-08-03 ENCOUNTER — Telehealth: Payer: Self-pay | Admitting: Internal Medicine

## 2022-08-03 MED ORDER — PREDNISONE 10 MG PO TABS: ORAL_TABLET | ORAL | 0 refills | Status: DC

## 2022-08-03 MED ORDER — PREDNISONE 10 MG PO TABS: 10.0000 mg | ORAL_TABLET | Freq: Every day | ORAL | 0 refills | Status: DC

## 2022-08-03 NOTE — Telephone Encounter (Signed)
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 H eis agreeable to try another round of pred  Plan - Please take prednisone 40 mg x1 day, then 30 mg x1 day, then 20 mg x1 day, then 10 mg x1 day, and then 5 mg x1 day and stop

## 2022-08-03 NOTE — Telephone Encounter (Signed)
Rx has been sent to pharmacy for pred taper Had to phone in since rx was not going through electronically

## 2022-08-05 ENCOUNTER — Other Ambulatory Visit: Payer: Self-pay | Admitting: Internal Medicine

## 2022-08-06 ENCOUNTER — Other Ambulatory Visit: Payer: Self-pay

## 2022-08-06 MED ORDER — PREDNISONE 10 MG PO TABS: ORAL_TABLET | ORAL | 0 refills | Status: DC

## 2022-09-07 ENCOUNTER — Other Ambulatory Visit: Payer: Self-pay | Admitting: Internal Medicine

## 2022-09-24 ENCOUNTER — Other Ambulatory Visit: Payer: Self-pay | Admitting: Family Medicine

## 2022-09-27 ENCOUNTER — Other Ambulatory Visit: Payer: Self-pay | Admitting: Family Medicine

## 2022-10-18 ENCOUNTER — Other Ambulatory Visit (HOSPITAL_COMMUNITY): Payer: Self-pay

## 2022-10-18 MED ORDER — DOFETILIDE 250 MCG PO CAPS: 250.0000 ug | ORAL_CAPSULE | Freq: Two times a day (BID) | ORAL | 1 refills | Status: DC

## 2022-10-25 DIAGNOSIS — Z85828 Personal history of other malignant neoplasm of skin: Secondary | ICD-10-CM | POA: Diagnosis not present

## 2022-10-25 DIAGNOSIS — D692 Other nonthrombocytopenic purpura: Secondary | ICD-10-CM | POA: Diagnosis not present

## 2022-10-25 DIAGNOSIS — L570 Actinic keratosis: Secondary | ICD-10-CM | POA: Diagnosis not present

## 2022-10-25 DIAGNOSIS — L821 Other seborrheic keratosis: Secondary | ICD-10-CM | POA: Diagnosis not present

## 2022-10-25 DIAGNOSIS — L814 Other melanin hyperpigmentation: Secondary | ICD-10-CM | POA: Diagnosis not present

## 2022-10-25 DIAGNOSIS — D1801 Hemangioma of skin and subcutaneous tissue: Secondary | ICD-10-CM | POA: Diagnosis not present

## 2022-10-29 ENCOUNTER — Ambulatory Visit: Payer: PPO

## 2022-10-29 ENCOUNTER — Encounter: Payer: Self-pay | Admitting: Family Medicine

## 2022-10-29 ENCOUNTER — Ambulatory Visit (INDEPENDENT_AMBULATORY_CARE_PROVIDER_SITE_OTHER): Payer: PPO | Admitting: Family Medicine

## 2022-10-29 VITALS — BP 110/60 | HR 76 | Temp 97.4°F | Ht 69.75 in | Wt 141.6 lb

## 2022-10-29 DIAGNOSIS — Z96652 Presence of left artificial knee joint: Secondary | ICD-10-CM | POA: Diagnosis not present

## 2022-10-29 DIAGNOSIS — M25562 Pain in left knee: Secondary | ICD-10-CM

## 2022-10-29 DIAGNOSIS — M85862 Other specified disorders of bone density and structure, left lower leg: Secondary | ICD-10-CM | POA: Diagnosis not present

## 2022-10-29 MED ORDER — OXYCODONE HCL 5 MG PO TABS: 5.0000 mg | ORAL_TABLET | Freq: Four times a day (QID) | ORAL | 0 refills | Status: DC | PRN

## 2022-10-29 NOTE — Patient Instructions (Signed)
I will send in some Oxycodone to use as needed.  Let me know if pain not continuing to improve over next few days.

## 2022-10-29 NOTE — Progress Notes (Unsigned)
Established Patient Office Visit  Subjective   Patient ID: [name] Heath, MD, male    DOB: 10-Jun-1929  Age: 89 y.o. 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  He had total knee replacement several years ago.  He has pain started about a week and a half ago.  This did follow playing golf but he denied any specific injury.  He had quite severe pain yesterday and last night but no erythema.  No fevers or chills.  He took some tramadol without much relief.  He had 1 leftover oxycodone which did provide some relief earlier today.  No calf pain.  Some pain with knee flexion.  We have spoken with him yesterday and I recommend coming in today for some x-rays and further evaluation.  Denies any recent fall.  He states pain is significantly improved today compared with yesterday.  Still has some soreness but less acute  Past Medical History:  Diagnosis Date   Acute blood loss anemia 08/27/2021   Acute hemorrhagic infarction of brain [facility])    Allergic rhinitis    Anxiety    Asthma    Atrial fibrillation (HCC)    CAD (coronary artery disease)    CHF (congestive heart failure) (HCC)    Depression    Diverticulosis 03/16/1995   DJD (degenerative joint disease)    "most joints" (03/11/2015)   GERD (gastroesophageal reflux disease) 12/30/2000   Heart attack (HCC) 1984   Hiatal hernia 12/30/2000   History of blood transfusion 1988   "related to GI bleeding OR"   History of duodenal ulcer 10/15/1986   Hyperlipidemia    Hypertension    ICH (intracerebral hemorrhage) (HCC)    Sepsis (HCC) 12/2018   Sinus bradycardia    Stroke [facility])    Past Surgical History:  Procedure Laterality Date   CARDIAC CATHETERIZATION     CATARACT EXTRACTION W/ INTRAOCULAR LENS  IMPLANT, BILATERAL Bilateral ?2013   CHOLECYSTECTOMY OPEN  1989   CORONARY ANGIOPLASTY  WITH STENT PLACEMENT  2010   hospital ccu  1984   heart attack, hypoplastic right coronary   INGUINAL HERNIA REPAIR Right ~ 1990   NASAL SEPTUM SURGERY  1972   ORCHIECTOMY Bilateral 06/10/2017   Procedure: BILATERAL ORCHIECTOMY;  Surgeon: Marcine Matar, MD;  Location: [facility];  Service: Urology;  Laterality: Bilateral;  MAC ANESTHESIA AND LOCAL   TONSILLECTOMY     TOTAL KNEE ARTHROPLASTY Bilateral 1998-2004   "right-left"   VAGOTOMY AND PYLOROPLASTY  1988   "bleeding duodenal ulcer"    reports that he has never smoked. He has never used smokeless tobacco. He reports that he does not drink alcohol and does not use drugs. family history includes Asthma in his brother; Coronary artery disease in his brother and mother; Deep vein thrombosis in his sister. Allergies  Allergen Reactions   Tylenol [Acetaminophen] Rash   Lactose Intolerance (Gi) Diarrhea and Nausea Only   Zithromax [Azithromycin] Other (See Comments)    Triggered A-fib   Advil [Ibuprofen] Nausea Only and Rash   Asa [Aspirin] Rash    Review of Systems  Constitutional:  Negative for chills and fever.      Objective:     BP 110/60 (BP Location: Right Arm, Patient Position: Sitting, Cuff Size: Normal)   Pulse 76   Temp (!) 97.4  F (36.3 C) (Oral)   Ht 5' 9.75" (1.772 m)   Wt 141 lb 9.6 oz (64.2 kg)   SpO2 97%   BMI 20.46 kg/m  {Vitals History (Optional):23777}  Physical Exam Vitals reviewed.  Constitutional:      Appearance: Normal appearance.  Musculoskeletal:     Comments: Left knee reveals no localized bony tenderness.  No effusion.  No erythema or warmth.  He has fairly good range of motion with flexion and extension.  Neurological:     Mental Status: He is alert.      No results found for any visits on 10/29/22.  {Labs (Optional):23779}  The ASCVD Risk score (Arnett DK, et al., 2019) failed to calculate for the following reasons:   The 2019 ASCVD risk score is only valid for ages 38 to 2    The patient has a prior MI or stroke diagnosis    Assessment & Plan:   Problem List Items Addressed This Visit   None Visit Diagnoses     Left knee pain, unspecified chronicity    -  Primary   Relevant Orders   DG Knee Complete 4 Views Left     Over 1 week history of acute left knee pain.  Onset after playing golf.  History of total knee replacement several years ago.  No acute injury.  X-rays reveal hardware appears to be in place.  No acute bony abnormality.  This will be over read  -Wrote for limited oxycodone 5 mg 1 every 6 hours as needed for severe pain number 10 tablets -If pain not continuing to improve in the next week be in touch and we will get touch with his orthopedist  No follow-ups on file.    [name] Peat, MD

## 2022-10-30 ENCOUNTER — Other Ambulatory Visit: Payer: Self-pay | Admitting: Family Medicine

## 2022-11-01 ENCOUNTER — Ambulatory Visit (HOSPITAL_COMMUNITY)
Admission: RE | Admit: 2022-11-01 | Discharge: 2022-11-01 | Disposition: A | Discharge status: Home / Self Care | Payer: PPO | Source: Ambulatory Visit | Attending: Cardiology | Admitting: Cardiology

## 2022-11-01 VITALS — BP 138/58 | HR 61 | Wt 144.0 lb

## 2022-11-01 DIAGNOSIS — I130 Hypertensive heart and chronic kidney disease with heart failure and stage 1 through stage 4 chronic kidney disease, or unspecified chronic kidney disease: Secondary | ICD-10-CM | POA: Insufficient documentation

## 2022-11-01 DIAGNOSIS — R9431 Abnormal electrocardiogram [ECG] [EKG]: Secondary | ICD-10-CM | POA: Diagnosis not present

## 2022-11-01 DIAGNOSIS — I361 Nonrheumatic tricuspid (valve) insufficiency: Secondary | ICD-10-CM | POA: Diagnosis not present

## 2022-11-01 DIAGNOSIS — I480 Paroxysmal atrial fibrillation: Secondary | ICD-10-CM | POA: Diagnosis not present

## 2022-11-01 DIAGNOSIS — I5032 Chronic diastolic (congestive) heart failure: Secondary | ICD-10-CM

## 2022-11-01 DIAGNOSIS — I252 Old myocardial infarction: Secondary | ICD-10-CM | POA: Insufficient documentation

## 2022-11-01 DIAGNOSIS — Z7901 Long term (current) use of anticoagulants: Secondary | ICD-10-CM | POA: Insufficient documentation

## 2022-11-01 DIAGNOSIS — E785 Hyperlipidemia, unspecified: Secondary | ICD-10-CM | POA: Diagnosis not present

## 2022-11-01 DIAGNOSIS — I2510 Atherosclerotic heart disease of native coronary artery without angina pectoris: Secondary | ICD-10-CM | POA: Insufficient documentation

## 2022-11-01 LAB — CBC
HCT: 35.7 % — ABNORMAL LOW (ref 39.0–52.0)
Hemoglobin: 10.9 g/dL — ABNORMAL LOW (ref 13.0–17.0)
MCH: 28.6 pg (ref 26.0–34.0)
MCHC: 30.5 g/dL (ref 30.0–36.0)
MCV: 93.7 fL (ref 80.0–100.0)
Platelets: 257 10*3/uL (ref 150–400)
RBC: 3.81 MIL/uL — ABNORMAL LOW (ref 4.22–5.81)
RDW: 14 % (ref 11.5–15.5)
WBC: 8.8 10*3/uL (ref 4.0–10.5)
nRBC: 0 % (ref 0.0–0.2)

## 2022-11-01 LAB — BASIC METABOLIC PANEL
Anion gap: 4 — ABNORMAL LOW (ref 5–15)
BUN: 22 mg/dL (ref 8–23)
CO2: 24 mmol/L (ref 22–32)
Calcium: 8.1 mg/dL — ABNORMAL LOW (ref 8.9–10.3)
Chloride: 108 mmol/L (ref 98–111)
Creatinine, Ser: 1.03 mg/dL (ref 0.61–1.24)
GFR, Estimated: >60 mL/min (ref >60)
Glucose, Bld: 104 mg/dL — ABNORMAL HIGH (ref 70–99)
Potassium: 4.5 mmol/L (ref 3.5–5.1)
Sodium: 136 mmol/L (ref 135–145)

## 2022-11-01 NOTE — Patient Instructions (Signed)
There has been no changes to your medications.  Labs done today, your results will be available in MyChart, we will contact you for abnormal readings.  Your physician recommends that you schedule a follow-up appointment in: 6 months ( March 2025) ** PLEASE CALL THE OFFICE IN [place] TO ARRANGE YOUR FOLLOW UP APPOINTMENT. **  If you have any questions or concerns before your next appointment please send [place] a message through [place] or call our office at [phone].    TO LEAVE A MESSAGE FOR THE NURSE SELECT OPTION 2, PLEASE LEAVE A MESSAGE INCLUDING: YOUR NAME DATE OF BIRTH CALL BACK NUMBER REASON FOR CALL**this is important as we prioritize the call backs  YOU WILL RECEIVE A CALL BACK THE SAME DAY AS LONG AS YOU CALL BEFORE 4:00 PM  At the [facility], you and your health needs are our priority. As part of our continuing mission to provide you with exceptional heart care, we have created designated Provider Care Teams. These Care Teams include your primary Cardiologist (physician) and Advanced Practice Providers (APPs- Physician Assistants and Nurse Practitioners) who all work together to provide you with the care you need, when you need it.   You may see any of the following providers on your designated Care Team at your next follow up: Dr Arvilla Meres Dr Marca Ancona Dr. Marcos Eke, NP Robbie Lis, [place] [facility] [place], [place] Brynda Peon, NP Karle Plumber, PharmD   Please be sure to bring in all your medications bottles to every appointment.    Thank you for choosing [facility]

## 2022-11-01 NOTE — Progress Notes (Signed)
Patient ID: [name] Heath, MD, male   DOB: 09/11/1929, 89 y.o.   MRN: [id number] PCP: Kristian Covey, MD Cardiology: Dr. 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  He had an RV infarct with chronic total occlusion of a small nondominant RCA in the 1980s.  He had a drug-eluting stent to the CFX in 11/10. He is very symptomatic with atrial fibrillation episodes. He has had a chronic pattern of mild shortness of breath and mild left shoulder pain/chest aching when walking up a hill (x 20 years).   He can walk 3 miles on flat ground without problems.  He golfs 3-4 times a week.  He walks for 20-30 minutes on a treadmill several times a week.   Echo in 7/16 showed normal LV systolic function, EF 60-65% with moderate diastolic dysfunction.  Moderate TR with PA systolic pressure 65 mmHg.  Normal RV size and systolic function.  Given volume overload on exam and abnormal echo, he was started on low dose Lasix in the past.    He was started on Multaq but continued to have episodes of atrial fibrillation.  This was stopped, and he was admitted for Tikosyn loading.  After he went home, he developed left facial droop and left arm weakness.  He was found to have a small right parietal cortical infarct with hemorrhagic conversion.  His symptoms resolved quickly. INR was subtherapeutic at admission.  Warfarin was stopped and he was put on apixaban.   In 9/17, he was admitted with LLL PNA.  During the admission, he had a couple of relatively short runs of atrial fibrillation.  He developed more frequent episodes of atrial fibrillation at home and was started on Tikosyn.   In 5/19, he had bilateral orchiectomy for metastatic prostate cancer.  His PSA has come down considerably.   In 1/20, he had influenza that progressed to PNA.  He was hospitalized with transient atrial fibrillation.    In 11/20, he was  admitted with an E coli UTI and urosepsis with hypotension and AKI.  He was initially fluid-resuscitated and later required diuresis.  He was in atrial fibrillation while in the hospital.   Patient was admitted in 1/23 with influenza A as well as Strep pneumoniae PNA.  He was treated with oseltamivir and antibiotics. While in the hospital, he went into atrial fibrillation with controlled rate.    Patient returns for followup of atrial fibrillation.  He has been doing well recently with no palpitations, no AF noted on Apple Watch.  He is playing 9 holes of golf twice a week and works out with a Psychologist, educational.  He has had some pain in his left knee, thought to be due to overuse.  No chest pain. No significant exertional dyspnea and no recent wheezing. No BRBPR/melena.   ECG (personally reviewed): Possible ectopic atrial rhythm, nonspecific inferior and anterolateral TWIs, QTc 453 msec   Labs (6/11): LDL 63, HDL 35, K 5.5, creatinine 1.1 Labs (6/12): LDL 58, HDL 53, TSH normal, K 4.7, creatinine 1 Labs (12/12): BNP 79 Labs (7/14): K 5, creatinine 1.1, LDL 48, HDL 34 Labs (8/15): LDL 48, HDL 33, TSH normal, K 5.4, creatinine 1.2 Labs (6/16): K 5.1, creatinine 1.27, HCT 42.5 Labs (10/16): LDL 37, HDL 32 Labs (11/16): K 4.4, creatinine 1.36, BNP 103 Labs  (2/17): K 4.5, creatinine 1.38, Mg 2.0, LDL 32 Labs (3/17): HCT  41.7 Labs (5/17): K 4.2, creatinine 1.17, Mg 2.1 Labs (9/17): K 4.2, creatinine 1.16, HCT 37.3 Labs (11/17): hgb 13.3, LDL 40 Labs (5/18): K 4.5, creatinine 1.25 Labs (5/19): K 5.5, creatinine 1.16 Labs (2/20): K 4, creatinine 1.24, hgb 12.2 Labs (11/20): K 3.4, creatinine 1.34, hgb 10.3, LDL 49 Labs (12/20): K 4.5, creatinine 1.2 Labs (2/21): K 3.9, creatinine 1.5, TSH 4.5 (mildly elevated) Labs (5/21): TSH normal, K 5, creatinine 1.27 Labs (9/21): K 4.1, creatinine 1.22, LDL 40, TSH normal Labs (4/22): K 4.2, creatinine 1.11 Labs (8/22): hgb 12, LDL 36 Labs (10/22): K 4.5,  creatinine 1.38 Labs (1/23): K 4.6, creatinine 1.15, hgb 11.4 Labs (4/23): LDL 37 Labs (7/23): hgb 7.8, K 4.5, creatinine 1.14 Labs (8/23): hgb 9 Labs (1/24): LDL 29 Labs (3/24): K 3.6, creatinine 0.96  PMH: 1. CAD: RV infarct in the 1980s.  Chronic total occlusion of a small nondominant RCA.  11/10 PCI to CFX with Xience DES.  EF 60% on LV-gram at that time. ETT (11/14) with 9'07" exercise, no significant ST depression.  2.  Paroxysmal atrial fibrillation: Multaq was ineffective. Now on Tikosyn.  3.  Hyperlipidemia 4. GERD 5. HTN 6. Asthma 7. Chronic diastolic CHF: Echo (6/12): EF 16-10%, mild LV hypertrophy, no regional WMAs, mild AI, mild MR, mild-moderate biatrial enlargement, PA systolic pressure 51 mmHg. Echo (7/16) with EF 60-65%, moderate diastolic dysfunction, normal RV size and systolic function, mild-moderate MR, moderate TR, moderate biatrial enlargement, PA systolic pressure 65 mmHg.  - Echo (5/18): EF 55-60%, mild AI, mild MR, PASP 60 mmHg, normal RV size and systolic function.  - Echo (8/19): EF 60-65%, normal RV size and systolic function, severe TR with PASP 60 mmHg.  8. CKD 9. CVA: Right parietal cortical CVA with peri-infarct hemorrhagic conversion.  CTA showed right M2 branch flow gap.   10. Prostate cancer: Metastatic.  - Bilateral orchiectomy in 5/19.   SH: Nonsmoker.  Retired Marine scientist, lives in [place].  2 daughters.  Originally from [place].   FH: Brother with CABG.   ROS: All systems reviewed and negative except as per HPI.    Current Outpatient Medications  Medication Sig Dispense Refill   apixaban (ELIQUIS) 5 MG TABS tablet Take 1 tablet (5 mg total) by mouth 2 (two) times daily. 180 tablet 3   beclomethasone (BECONASE-AQ) 42 MCG/SPRAY nasal spray Place 1 spray into both nostrils daily as needed for allergies.     buPROPion (WELLBUTRIN SR) 150 MG 12 hr tablet TAKE 1 TABLET(150 MG) BY MOUTH DAILY 90 tablet 1   cetirizine (ZYRTEC) 10 MG tablet Take  10 mg by mouth daily.     Cholecalciferol (VITAMIN D-3 PO) Take 1 capsule by mouth at bedtime.     denosumab (PROLIA) 60 MG/ML SOSY injection Inject 60 mg into the skin every 6 (six) months.     dofetilide (TIKOSYN) 250 MCG capsule Take 1 capsule (250 mcg total) by mouth 2 (two) times daily. 180 capsule 1   Fluticasone Furoate (ARNUITY ELLIPTA) 200 MCG/ACT AEPB INHALE 1 PUFF INTO THE LUNGS DAILY 60 each 0   Lactase (LACTAID PO) Take 1 tablet by mouth 3 (three) times daily with meals as needed (lactose intolerance).     LORazepam (ATIVAN) 1 MG tablet TAKE 1/2 TO 1 TABLET(0.5 TO 1 MG) BY MOUTH AT BEDTIME AS NEEDED 90 tablet 1   omeprazole (PRILOSEC OTC) 20 MG tablet Take 1 tablet (20 mg total) by mouth daily. 100 tablet 4   oxyCODONE (OXY IR/ROXICODONE)  5 MG immediate release tablet Take 1 tablet (5 mg total) by mouth every 6 (six) hours as needed for severe pain. 10 tablet 0   Polyethyl Glycol-Propyl Glycol (SYSTANE OP) Place 1 drop into both eyes 4 (four) times daily as needed (dry eye).     atorvastatin (LIPITOR) 20 MG tablet TAKE 1 TABLET BY MOUTH EVERY DAY 90 tablet 1   FLUoxetine (PROZAC) 10 MG capsule TAKE 1 CAPSULE(10 MG) BY MOUTH DAILY 90 capsule 1   tamsulosin (FLOMAX) 0.4 MG CAPS capsule TAKE 1 CAPSULE(0.4 MG) BY MOUTH DAILY 90 capsule 1   No current facility-administered medications for this encounter.    BP (!) 138/58   Pulse 61   Wt 65.3 kg (144 lb)   SpO2 99%   BMI 20.81 kg/m  General: NAD Neck: No JVD, no thyromegaly or thyroid nodule.  Lungs: Clear to auscultation bilaterally with normal respiratory effort. CV: Nondisplaced PMI.  Heart regular S1/S2, no S3/S4, no murmur.  Trace ankle edema.  No carotid bruit.  Normal pedal pulses.  Abdomen: Soft, nontender, no hepatosplenomegaly, no distention.  Skin: Intact without lesions or rashes.  Neurologic: Alert and oriented x 3.  Psych: Normal affect. Extremities: No clubbing or cyanosis.  HEENT: Normal.    Assessment/Plan: 1. CAD: He has had chronic stable angina walking up a steep hill (manifests as left shoulder pain).  No change in pattern x 20 years.   - Continue medical management of angina, no indication for stress test or cath.  - He is on apixaban without ASA given stable CAD.   - Continue statin, good lipids in 1/24.    2. Hyperlipidemia: He is on atorvastatin 20 mg daily.  3. Chronic diastolic CHF:  Most recent echo in 8/19 showed EF 60-65% but PA systolic pressure was estimated at 60 mmHg and there was severe TR (progressed from moderate), the RV looked normal.  I suspect that he has a degree of RV failure.  He takes Lasix prn, no recent use.  He is not volume overloaded by exam. NYHA class II.   - He has wanted to hold off on repeat echo.  Given age, think this is reasonable as unlikely to change management much.   - He can continue current prn Lasix use, has not needed recently.  4. Atrial fibrillation: Paroxysmal.  Episodes are quite symptomatic (fatigue) and often triggered by some form of physical stress, he feels "bad" with atrial fibrillation even when he's rate-controlled. He had frequent breakthrough on Multaq but and is now on Tikosyn.  QTc ok on today's ECG.  He is now off diltiazem CD and atenolol due to low BP and orthostasis.  He is in a stable ectopic atrial rhythm today, no recent AF symptoms.  - Can take metoprolol 25 mg prn tachypalpitations.  - Continue Tikosyn, QTc acceptable on today's ECG.  BMET/Mg today. - If he develops more frequent breakthrough afib on Tikosyn, amiodarone would be an option.   - Continue Eliquis. CBC today.  5. Tricuspid regurgitation: Severe on 8/19 echo, but no significant murmur noted on exam today.  Not candidate for surgical TVR and not interested in referral to trial for percutaneous repair.   Followup in 6 months.    Marca Ancona 11/01/2022

## 2022-11-01 NOTE — Progress Notes (Signed)
Medication Samples have been provided to the patient.  Drug name: ELIQUIS       Strength: 5 MG         Qty: 4 BOXES  LOT: [id number]  Exp.Date: 10/25  Dosing instructions: TAKE 1 TABLET Twice daily   The patient has been instructed regarding the correct time, dose, and frequency of taking this medication, including desired effects and most common side effects.   Cayden Granholm M Akoni Parton 1:42 PM 11/01/2022

## 2022-11-15 ENCOUNTER — Other Ambulatory Visit: Payer: Self-pay | Admitting: Family Medicine

## 2022-11-17 ENCOUNTER — Other Ambulatory Visit: Payer: Self-pay | Admitting: Internal Medicine

## 2022-11-19 ENCOUNTER — Other Ambulatory Visit: Payer: Self-pay | Admitting: Family Medicine

## 2022-11-19 MED ORDER — ARNUITY ELLIPTA 200 MCG/ACT IN AEPB: 1.0000 | INHALATION_SPRAY | Freq: Every day | RESPIRATORY_TRACT | 5 refills | Status: DC

## 2022-12-01 DIAGNOSIS — Z8546 Personal history of malignant neoplasm of prostate: Secondary | ICD-10-CM | POA: Diagnosis not present

## 2022-12-01 DIAGNOSIS — C61 Malignant neoplasm of prostate: Secondary | ICD-10-CM | POA: Diagnosis not present

## 2022-12-08 DIAGNOSIS — R351 Nocturia: Secondary | ICD-10-CM | POA: Diagnosis not present

## 2022-12-08 DIAGNOSIS — C7951 Secondary malignant neoplasm of bone: Secondary | ICD-10-CM | POA: Diagnosis not present

## 2022-12-08 DIAGNOSIS — N401 Enlarged prostate with lower urinary tract symptoms: Secondary | ICD-10-CM | POA: Diagnosis not present

## 2022-12-08 DIAGNOSIS — C61 Malignant neoplasm of prostate: Secondary | ICD-10-CM | POA: Diagnosis not present

## 2023-01-28 ENCOUNTER — Other Ambulatory Visit (HOSPITAL_COMMUNITY): Payer: Self-pay | Admitting: Cardiology

## 2023-02-01 ENCOUNTER — Other Ambulatory Visit: Payer: Self-pay | Admitting: Family Medicine

## 2023-02-07 DIAGNOSIS — H5211 Myopia, right eye: Secondary | ICD-10-CM | POA: Diagnosis not present

## 2023-02-07 DIAGNOSIS — H5202 Hypermetropia, left eye: Secondary | ICD-10-CM | POA: Diagnosis not present

## 2023-02-07 DIAGNOSIS — H0100B Unspecified blepharitis left eye, upper and lower eyelids: Secondary | ICD-10-CM | POA: Diagnosis not present

## 2023-02-07 DIAGNOSIS — H0100A Unspecified blepharitis right eye, upper and lower eyelids: Secondary | ICD-10-CM | POA: Diagnosis not present

## 2023-02-08 ENCOUNTER — Other Ambulatory Visit (HOSPITAL_COMMUNITY): Payer: Self-pay

## 2023-02-08 ENCOUNTER — Ambulatory Visit (HOSPITAL_COMMUNITY)
Admission: RE | Admit: 2023-02-08 | Discharge: 2023-02-08 | Disposition: A | Discharge status: Home / Self Care | Payer: PPO | Source: Ambulatory Visit | Attending: Cardiology | Admitting: Cardiology

## 2023-02-08 VITALS — BP 148/82 | HR 78 | Wt 148.0 lb

## 2023-02-08 DIAGNOSIS — I480 Paroxysmal atrial fibrillation: Secondary | ICD-10-CM | POA: Insufficient documentation

## 2023-02-08 DIAGNOSIS — I130 Hypertensive heart and chronic kidney disease with heart failure and stage 1 through stage 4 chronic kidney disease, or unspecified chronic kidney disease: Secondary | ICD-10-CM | POA: Diagnosis not present

## 2023-02-08 DIAGNOSIS — I25118 Atherosclerotic heart disease of native coronary artery with other forms of angina pectoris: Secondary | ICD-10-CM | POA: Insufficient documentation

## 2023-02-08 DIAGNOSIS — Z955 Presence of coronary angioplasty implant and graft: Secondary | ICD-10-CM | POA: Insufficient documentation

## 2023-02-08 DIAGNOSIS — I252 Old myocardial infarction: Secondary | ICD-10-CM | POA: Insufficient documentation

## 2023-02-08 DIAGNOSIS — E785 Hyperlipidemia, unspecified: Secondary | ICD-10-CM | POA: Insufficient documentation

## 2023-02-08 DIAGNOSIS — Z7901 Long term (current) use of anticoagulants: Secondary | ICD-10-CM | POA: Diagnosis not present

## 2023-02-08 DIAGNOSIS — N189 Chronic kidney disease, unspecified: Secondary | ICD-10-CM | POA: Insufficient documentation

## 2023-02-08 DIAGNOSIS — R0602 Shortness of breath: Secondary | ICD-10-CM | POA: Insufficient documentation

## 2023-02-08 DIAGNOSIS — Z79899 Other long term (current) drug therapy: Secondary | ICD-10-CM | POA: Insufficient documentation

## 2023-02-08 DIAGNOSIS — R42 Dizziness and giddiness: Secondary | ICD-10-CM | POA: Insufficient documentation

## 2023-02-08 DIAGNOSIS — I5032 Chronic diastolic (congestive) heart failure: Secondary | ICD-10-CM | POA: Insufficient documentation

## 2023-02-08 DIAGNOSIS — I25119 Atherosclerotic heart disease of native coronary artery with unspecified angina pectoris: Secondary | ICD-10-CM | POA: Diagnosis not present

## 2023-02-08 LAB — BASIC METABOLIC PANEL
Anion gap: 7 (ref 5–15)
BUN: 33 mg/dL — ABNORMAL HIGH (ref 8–23)
CO2: 25 mmol/L (ref 22–32)
Calcium: 9.1 mg/dL (ref 8.9–10.3)
Chloride: 106 mmol/L (ref 98–111)
Creatinine, Ser: 1.26 mg/dL — ABNORMAL HIGH (ref 0.61–1.24)
GFR, Estimated: 54 mL/min — ABNORMAL LOW (ref >60)
Glucose, Bld: 114 mg/dL — ABNORMAL HIGH (ref 70–99)
Potassium: 4.5 mmol/L (ref 3.5–5.1)
Sodium: 138 mmol/L (ref 135–145)

## 2023-02-08 LAB — TSH: TSH: 3.585 u[IU]/mL (ref 0.350–4.500)

## 2023-02-08 LAB — CBC
HCT: 36.5 % — ABNORMAL LOW (ref 39.0–52.0)
Hemoglobin: 11.5 g/dL — ABNORMAL LOW (ref 13.0–17.0)
MCH: 29.7 pg (ref 26.0–34.0)
MCHC: 31.5 g/dL (ref 30.0–36.0)
MCV: 94.3 fL (ref 80.0–100.0)
Platelets: 238 10*3/uL (ref 150–400)
RBC: 3.87 MIL/uL — ABNORMAL LOW (ref 4.22–5.81)
RDW: 14.5 % (ref 11.5–15.5)
WBC: 10.3 10*3/uL (ref 4.0–10.5)
nRBC: 0 % (ref 0.0–0.2)

## 2023-02-08 LAB — MAGNESIUM: Magnesium: 2 mg/dL (ref 1.7–2.4)

## 2023-02-08 LAB — BRAIN NATRIURETIC PEPTIDE: B Natriuretic Peptide: 284.2 pg/mL — ABNORMAL HIGH (ref 0.0–100.0)

## 2023-02-08 NOTE — Patient Instructions (Addendum)
 There has been no changes in your medications.  Labs done today, your results will be available in MyChart, we will contact you for abnormal readings.  You are scheduled for a Cardioversion on Friday, January 3 with Dr. Rolan.  Please arrive at the [facility] (Main Entrance A) at [facility]: [address] [place], [place] [address] at 10:00 AM (This time is 1 hour(s) before your procedure to ensure your preparation).   Free valet parking service is available. You will check in at ADMITTING.   *Please Note: You will receive a call the day before your procedure to confirm the appointment time. That time may have changed from the original time based on the schedule for that day.*    DIET:  Nothing to eat or drink after midnight except a sip of water with medications (see medication instructions below)  MEDICATION INSTRUCTIONS:       Continue taking your anticoagulant (blood thinner): Apixaban  (Eliquis ).  You will need to continue this after your procedure until you are told by your provider that it is safe to stop.    FYI:  For your safety, and to allow us  to monitor your vital signs accurately during the surgery/procedure we request: If you have artificial nails, gel coating, SNS etc, please have those removed prior to your surgery/procedure. Not having the nail coverings /polish removed may result in cancellation or delay of your surgery/procedure.  Your support person will be asked to wait in the waiting room during your procedure.  It is OK to have someone drop you off and come back when you are ready to be discharged.  You cannot drive after the procedure and will need someone to drive you home.  Bring your insurance cards.  *Special Note: Every effort is made to have your procedure done on time. Occasionally there are emergencies that occur at the hospital that may cause delays. Please be patient if a delay does occur.    Your physician recommends that you schedule a  follow-up appointment in: 1 month.  If you have any questions or concerns before your next appointment please send us  a message through [place] or call our office at [phone].    TO LEAVE A MESSAGE FOR THE NURSE SELECT OPTION 2, PLEASE LEAVE A MESSAGE INCLUDING: YOUR NAME DATE OF BIRTH CALL BACK NUMBER REASON FOR CALL**this is important as we prioritize the call backs  YOU WILL RECEIVE A CALL BACK THE SAME DAY AS LONG AS YOU CALL BEFORE 4:00 PM  At the [facility], you and your health needs are our priority. As part of our continuing mission to provide you with exceptional heart care, we have created designated Provider Care Teams. These Care Teams include your primary Cardiologist (physician) and Advanced Practice Providers (APPs- Physician Assistants and Nurse Practitioners) who all work together to provide you with the care you need, when you need it.   You may see any of the following providers on your designated Care Team at your next follow up: Dr Toribio Fuel Dr Ezra Rolan Dr. Ria Commander Dr. Morene Brownie Amy Lenetta, NP Caffie Shed, [place] [facility] [place], [place] Beckey Coe, NP Jordan Lee, NP Tinnie Redman, PharmD   Please be sure to bring in all your medications bottles to every appointment.    Thank you for choosing [facility]

## 2023-02-10 ENCOUNTER — Ambulatory Visit (HOSPITAL_COMMUNITY)
Admission: RE | Admit: 2023-02-10 | Discharge: 2023-02-10 | Disposition: A | Discharge status: Home / Self Care | Payer: PPO | Source: Ambulatory Visit | Attending: Cardiology | Admitting: Cardiology

## 2023-02-10 DIAGNOSIS — I480 Paroxysmal atrial fibrillation: Secondary | ICD-10-CM | POA: Insufficient documentation

## 2023-02-10 DIAGNOSIS — R9431 Abnormal electrocardiogram [ECG] [EKG]: Secondary | ICD-10-CM | POA: Diagnosis not present

## 2023-02-10 NOTE — Progress Notes (Signed)
 Patient ID: [name] LOISE Perry, MD, male   DOB: 1929/03/14, 89 y.o.   MRN: [id number] PCP: Micheal Wolm ORN, MD Cardiology: Dr. 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  He had an RV infarct with chronic total occlusion of a small nondominant RCA in the 1980s.  He had a drug-eluting stent to the CFX in 11/10. He is very symptomatic with atrial fibrillation episodes. He has had a chronic pattern of mild shortness of breath and mild left shoulder pain/chest aching when walking up a hill (x 20 years).   He can walk 3 miles on flat ground without problems.  He golfs 3-4 times a week.  He walks for 20-30 minutes on a treadmill several times a week.   Echo in 7/16 showed normal LV systolic function, EF 60-65% with moderate diastolic dysfunction.  Moderate TR with PA systolic pressure 65 mmHg.  Normal RV size and systolic function.  Given volume overload on exam and abnormal echo, he was started on low dose Lasix  in the past.    He was started on Multaq  but continued to have episodes of atrial fibrillation.  This was stopped, and he was admitted for Tikosyn  loading.  After he went home, he developed left facial droop and left arm weakness.  He was found to have a small right parietal cortical infarct with hemorrhagic conversion.  His symptoms resolved quickly. INR was subtherapeutic at admission.  Warfarin was stopped and he was put on apixaban .   In 9/17, he was admitted with LLL PNA.  During the admission, he had a couple of relatively short runs of atrial fibrillation.  He developed more frequent episodes of atrial fibrillation at home and was started on Tikosyn .   In 5/19, he had bilateral orchiectomy for metastatic prostate cancer.  His PSA has come down considerably.   In 1/20, he had influenza that progressed to PNA.  He was hospitalized with transient atrial fibrillation.    In 11/20, he was  admitted with an E coli UTI and urosepsis with hypotension and AKI.  He was initially fluid-resuscitated and later required diuresis.  He was in atrial fibrillation while in the hospital.   Patient was admitted in 1/23 with influenza A as well as Strep pneumoniae PNA.  He was treated with oseltamivir  and antibiotics. While in the hospital, he went into atrial fibrillation with controlled rate.    Patient returns for followup of atrial fibrillation.  He has been back in atrial fibrillation for about 10 days.  No particular trigger that he can think of.  He feels mildly lightheaded/dizzy in atrial fibrillation and feels like his balance is off.  He is using his walker.  No exertional dyspnea or chest pain. Weight up 4 lbs.  He has not missed any Tikosyn .    ECG (personally reviewed): atrial fibrillation at 78 bpm, nonspecific TWF, QTc 458.    Labs (6/11): LDL 63, HDL 35, K 5.5, creatinine 1.1 Labs (6/12): LDL 58, HDL 53, TSH normal, K 4.7, creatinine 1 Labs (12/12): BNP 79 Labs (7/14): K 5, creatinine 1.1, LDL 48, HDL 34 Labs (8/15): LDL 48, HDL 33, TSH normal, K 5.4, creatinine 1.2 Labs (6/16): K 5.1, creatinine 1.27, HCT 42.5 Labs (10/16): LDL 37, HDL 32 Labs (11/16): K 4.4, creatinine 1.36, BNP 103 Labs  (2/17): K 4.5, creatinine 1.38, Mg 2.0, LDL 32 Labs (3/17): HCT 41.7 Labs (5/17): K  4.2, creatinine 1.17, Mg 2.1 Labs (9/17): K 4.2, creatinine 1.16, HCT 37.3 Labs (11/17): hgb 13.3, LDL 40 Labs (5/18): K 4.5, creatinine 1.25 Labs (5/19): K 5.5, creatinine 1.16 Labs (2/20): K 4, creatinine 1.24, hgb 12.2 Labs (11/20): K 3.4, creatinine 1.34, hgb 10.3, LDL 49 Labs (12/20): K 4.5, creatinine 1.2 Labs (2/21): K 3.9, creatinine 1.5, TSH 4.5 (mildly elevated) Labs (5/21): TSH normal, K 5, creatinine 1.27 Labs (9/21): K 4.1, creatinine 1.22, LDL 40, TSH normal Labs (4/22): K 4.2, creatinine 1.11 Labs (8/22): hgb 12, LDL 36 Labs (10/22): K 4.5, creatinine 1.38 Labs (1/23): K 4.6, creatinine  1.15, hgb 11.4 Labs (4/23): LDL 37 Labs (7/23): hgb 7.8, K 4.5, creatinine 1.14 Labs (8/23): hgb 9 Labs (1/24): LDL 29 Labs (3/24): K 3.6, creatinine 0.96 Labs (924): K 4.5, 1.03, hgb 10.9  PMH: 1. CAD: RV infarct in the 1980s.  Chronic total occlusion of a small nondominant RCA.  11/10 PCI to CFX with Xience DES.  EF 60% on LV-gram at that time. ETT (11/14) with 9'07 exercise, no significant ST depression.  2.  Paroxysmal atrial fibrillation: Multaq  was ineffective. Now on Tikosyn .  3.  Hyperlipidemia 4. GERD 5. HTN 6. Asthma 7. Chronic diastolic CHF: Echo (6/12): EF 44-39%, mild LV hypertrophy, no regional WMAs, mild AI, mild MR, mild-moderate biatrial enlargement, PA systolic pressure 51 mmHg. Echo (7/16) with EF 60-65%, moderate diastolic dysfunction, normal RV size and systolic function, mild-moderate MR, moderate TR, moderate biatrial enlargement, PA systolic pressure 65 mmHg.  - Echo (5/18): EF 55-60%, mild AI, mild MR, PASP 60 mmHg, normal RV size and systolic function.  - Echo (8/19): EF 60-65%, normal RV size and systolic function, severe TR with PASP 60 mmHg.  8. CKD 9. CVA: Right parietal cortical CVA with peri-infarct hemorrhagic conversion.  CTA showed right M2 branch flow gap.   10. Prostate cancer: Metastatic.  - Bilateral orchiectomy in 5/19.   SH: Nonsmoker.  Retired marine scientist, lives in [place].  2 daughters.  Originally from [place].   FH: Brother with CABG.   ROS: All systems reviewed and negative except as per HPI.    Current Outpatient Medications  Medication Sig Dispense Refill   apixaban  (ELIQUIS ) 5 MG TABS tablet Take 1 tablet (5 mg total) by mouth 2 (two) times daily. 180 tablet 3   atorvastatin  (LIPITOR) 20 MG tablet TAKE 1 TABLET BY MOUTH EVERY DAY 90 tablet 1   beclomethasone (BECONASE-AQ) 42 MCG/SPRAY nasal spray Place 1 spray into both nostrils daily as needed for allergies.     buPROPion  (WELLBUTRIN  SR) 150 MG 12 hr tablet TAKE 1 TABLET(150  MG) BY MOUTH DAILY 90 tablet 0   cetirizine (ZYRTEC) 10 MG tablet Take 10 mg by mouth daily.     Cholecalciferol  (VITAMIN D -3 PO) Take 1 capsule by mouth at bedtime.     denosumab  (PROLIA ) 60 MG/ML SOSY injection Inject 60 mg into the skin every 6 (six) months.     dofetilide  (TIKOSYN ) 250 MCG capsule TAKE 1 CAPSULE(250 MCG) BY MOUTH TWICE DAILY 180 capsule 1   FLUoxetine  (PROZAC ) 10 MG capsule TAKE 1 CAPSULE(10 MG) BY MOUTH DAILY 90 capsule 1   Fluticasone  Furoate (ARNUITY ELLIPTA ) 200 MCG/ACT AEPB Inhale 1 puff into the lungs daily. 60 each 5   Lactase (LACTAID PO) Take 1 tablet by mouth 3 (three) times daily with meals as needed (lactose intolerance).     LORazepam  (ATIVAN ) 1 MG tablet TAKE 1/2 TO 1 TABLET(0.5 TO 1 MG) BY  MOUTH AT BEDTIME AS NEEDED 90 tablet 1   omeprazole  (PRILOSEC  OTC) 20 MG tablet Take 1 tablet (20 mg total) by mouth daily. 100 tablet 4   oxyCODONE  (OXY IR/ROXICODONE ) 5 MG immediate release tablet Take 1 tablet (5 mg total) by mouth every 6 (six) hours as needed for severe pain. 10 tablet 0   Polyethyl Glycol-Propyl Glycol (SYSTANE OP) Place 1 drop into both eyes 4 (four) times daily as needed (dry eye).     tamsulosin  (FLOMAX ) 0.4 MG CAPS capsule TAKE 1 CAPSULE(0.4 MG) BY MOUTH DAILY 90 capsule 1   traMADol  (ULTRAM ) 50 MG tablet Take 1 tablet (50 mg total) by mouth every 6 (six) hours as needed. 30 tablet 0   No current facility-administered medications for this encounter.    BP (!) 148/82   Pulse 78   Wt 67.1 kg (148 lb)   SpO2 98%   BMI 21.39 kg/m  General: NAD Neck: No JVD, no thyromegaly or thyroid  nodule.  Lungs: Clear to auscultation bilaterally with normal respiratory effort. CV: Nondisplaced PMI.  Heart irregular S1/S2, no S3/S4, 2/6 HSM LLSB.  Trace ankle edema.  No carotid bruit.  Normal pedal pulses.  Abdomen: Soft, nontender, no hepatosplenomegaly, no distention.  Skin: Intact without lesions or rashes.  Neurologic: Alert and oriented x 3.  Psych:  Normal affect. Extremities: No clubbing or cyanosis.  HEENT: Normal.   Assessment/Plan: 1. CAD: He has had chronic stable angina walking up a steep hill (manifests as left shoulder pain).  No change in pattern x 20 years.   - Continue medical management of angina, no indication for stress test or cath.  - He is on apixaban  without ASA given stable CAD.   - Continue statin, good lipids in 1/24.    2. Hyperlipidemia: He is on atorvastatin  20 mg daily.  3. Chronic diastolic CHF:  Most recent echo in 8/19 showed EF 60-65% but PA systolic pressure was estimated at 60 mmHg and there was severe TR (progressed from moderate), the RV looked normal.  I suspect that he has a degree of RV failure.  He takes Lasix  prn, no recent use.  He is not volume overloaded by exam. NYHA class II.    - He has wanted to hold off on repeat echo.  Given age, think this is reasonable as unlikely to change management much.   - He can continue current prn Lasix  use, has not needed recently.  4. Atrial fibrillation: Paroxysmal.  Episodes are quite symptomatic (fatigue and dizziness) and often triggered by some form of physical stress, he feels bad with atrial fibrillation even when he's rate-controlled. He had frequent breakthrough on Multaq  but and is now on Tikosyn .  QTc ok on today's ECG.  He is now off diltiazem  CD and atenolol  due to low BP and orthostasis.  For the last 10 days by his Apple Watch, he has been in AF.  He is in AF today with controlled rate.  He feels dizzy and unsteady and has been using his walker.  - Continue Tikosyn , QTc acceptable on today's ECG.  BMET/Mg today.  - Continue Eliquis . CBC today.  - Atrial fibrillation has lasted > 1 week and is symptomatic.  He has not missed any Eliquis . I will arrange for DCCV in the next week if he remains in AF.  We discussed risks/benefits and he agrees to procedure.  - Check TSH.  5. Tricuspid regurgitation: Severe on 8/19 echo, but no significant murmur noted on  exam  today.  Not candidate for surgical TVR and not interested in referral to trial for percutaneous repair.   Followup in 1 month.     Ezra Shuck 02/10/2023

## 2023-02-10 NOTE — Progress Notes (Signed)
 EKG with NSR, reviewed by Dr Shirlee Latch, cancel dccv sch for 02/11/23, pt aware, dccv cancelled

## 2023-02-11 ENCOUNTER — Ambulatory Visit (HOSPITAL_COMMUNITY): Admit: 2023-02-11 | Payer: PPO | Admitting: Cardiology

## 2023-02-11 ENCOUNTER — Encounter (HOSPITAL_COMMUNITY): Payer: Self-pay

## 2023-02-11 SURGERY — CARDIOVERSION (CATH LAB)
Anesthesia: Monitor Anesthesia Care

## 2023-02-28 ENCOUNTER — Other Ambulatory Visit (HOSPITAL_COMMUNITY): Payer: Self-pay

## 2023-02-28 MED ORDER — APIXABAN 5 MG PO TABS: 5.0000 mg | ORAL_TABLET | Freq: Two times a day (BID) | ORAL | 3 refills | Status: DC

## 2023-03-03 DIAGNOSIS — H0100B Unspecified blepharitis left eye, upper and lower eyelids: Secondary | ICD-10-CM | POA: Diagnosis not present

## 2023-03-03 DIAGNOSIS — H00011 Hordeolum externum right upper eyelid: Secondary | ICD-10-CM | POA: Diagnosis not present

## 2023-03-03 DIAGNOSIS — H0100A Unspecified blepharitis right eye, upper and lower eyelids: Secondary | ICD-10-CM | POA: Diagnosis not present

## 2023-03-11 ENCOUNTER — Ambulatory Visit (HOSPITAL_COMMUNITY)
Admission: RE | Admit: 2023-03-11 | Discharge: 2023-03-11 | Disposition: A | Discharge status: Home / Self Care | Payer: PPO | Source: Ambulatory Visit | Attending: Cardiology | Admitting: Cardiology

## 2023-03-11 ENCOUNTER — Encounter (HOSPITAL_COMMUNITY): Payer: Self-pay | Admitting: Cardiology

## 2023-03-11 VITALS — BP 92/50 | HR 78 | Wt 145.2 lb

## 2023-03-11 DIAGNOSIS — Z79899 Other long term (current) drug therapy: Secondary | ICD-10-CM | POA: Diagnosis not present

## 2023-03-11 DIAGNOSIS — Z7901 Long term (current) use of anticoagulants: Secondary | ICD-10-CM | POA: Insufficient documentation

## 2023-03-11 DIAGNOSIS — I071 Rheumatic tricuspid insufficiency: Secondary | ICD-10-CM | POA: Diagnosis not present

## 2023-03-11 DIAGNOSIS — R1032 Left lower quadrant pain: Secondary | ICD-10-CM | POA: Insufficient documentation

## 2023-03-11 DIAGNOSIS — E785 Hyperlipidemia, unspecified: Secondary | ICD-10-CM | POA: Diagnosis not present

## 2023-03-11 DIAGNOSIS — I5032 Chronic diastolic (congestive) heart failure: Secondary | ICD-10-CM | POA: Insufficient documentation

## 2023-03-11 DIAGNOSIS — I480 Paroxysmal atrial fibrillation: Secondary | ICD-10-CM | POA: Diagnosis not present

## 2023-03-11 DIAGNOSIS — I25119 Atherosclerotic heart disease of native coronary artery with unspecified angina pectoris: Secondary | ICD-10-CM | POA: Insufficient documentation

## 2023-03-11 DIAGNOSIS — Z955 Presence of coronary angioplasty implant and graft: Secondary | ICD-10-CM | POA: Insufficient documentation

## 2023-03-11 DIAGNOSIS — I130 Hypertensive heart and chronic kidney disease with heart failure and stage 1 through stage 4 chronic kidney disease, or unspecified chronic kidney disease: Secondary | ICD-10-CM | POA: Diagnosis not present

## 2023-03-11 DIAGNOSIS — R1031 Right lower quadrant pain: Secondary | ICD-10-CM | POA: Diagnosis not present

## 2023-03-11 LAB — LIPID PANEL
Cholesterol: 86 mg/dL (ref 0–200)
HDL: 44 mg/dL (ref >40)
LDL Cholesterol: 32 mg/dL (ref 0–99)
Total CHOL/HDL Ratio: 2 {ratio}
Triglycerides: 48 mg/dL (ref <150)
VLDL: 10 mg/dL (ref 0–40)

## 2023-03-11 LAB — IRON AND TIBC
Iron: 70 ug/dL (ref 45–182)
Saturation Ratios: 23 % (ref 17.9–39.5)
TIBC: 309 ug/dL (ref 250–450)
UIBC: 239 ug/dL

## 2023-03-11 LAB — FERRITIN: Ferritin: 32 ng/mL (ref 24–336)

## 2023-03-11 NOTE — Patient Instructions (Signed)
 There has been no changes to your medications   Labs done today, your results will be available in MyChart, we will contact you for abnormal readings.  Your physician recommends that you schedule a follow-up appointment in: 4 months ( May) ** PLEASE CALL  THE OFFICE IN MARCH TO ARRANGE YOUR FOLLOW UP APPOINTMENT.**  If you have any questions or concerns before your next appointment please send [place] a message through [place] or call our office at [phone].    TO LEAVE A MESSAGE FOR THE NURSE SELECT OPTION 2, PLEASE LEAVE A MESSAGE INCLUDING: YOUR NAME DATE OF BIRTH CALL BACK NUMBER REASON FOR CALL**this is important as we prioritize the call backs  YOU WILL RECEIVE A CALL BACK THE SAME DAY AS LONG AS YOU CALL BEFORE 4:00 PM  At the [facility], you and your health needs are our priority. As part of our continuing mission to provide you with exceptional heart care, we have created designated Provider Care Teams. These Care Teams include your primary Cardiologist (physician) and Advanced Practice Providers (APPs- Physician Assistants and Nurse Practitioners) who all work together to provide you with the care you need, when you need it.   You may see any of the following providers on your designated Care Team at your next follow up: Dr Arvilla Meres Dr Marca Ancona Dr. Dorthula Nettles Dr. Clearnce Hasten Amy Filbert Schilder, NP Robbie Lis, [place] [facility] [place], [place] Brynda Peon, NP [place] Lee, NP Karle Plumber, PharmD   Please be sure to bring in all your medications bottles to every appointment.    Thank you for choosing [facility]

## 2023-03-13 NOTE — Progress Notes (Signed)
Patient ID: [name] Heath, MD, male   DOB: 01/31/1930, 89 y.o.   MRN: [id number] PCP: Kristian Covey, MD Cardiology: Dr. 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  He had an RV infarct with chronic total occlusion of a small nondominant RCA in the 1980s.  He had a drug-eluting stent to the CFX in 11/10. He is very symptomatic with atrial fibrillation episodes. He has had a chronic pattern of mild shortness of breath and mild left shoulder pain/chest aching when walking up a hill (x 20 years).   He can walk 3 miles on flat ground without problems.  He golfs 3-4 times a week.  He walks for 20-30 minutes on a treadmill several times a week.   Echo in 7/16 showed normal LV systolic function, EF 60-65% with moderate diastolic dysfunction.  Moderate TR with PA systolic pressure 65 mmHg.  Normal RV size and systolic function.  Given volume overload on exam and abnormal echo, he was started on low dose Lasix in the past.    He was started on Multaq but continued to have episodes of atrial fibrillation.  This was stopped, and he was admitted for Tikosyn loading.  After he went home, he developed left facial droop and left arm weakness.  He was found to have a small right parietal cortical infarct with hemorrhagic conversion.  His symptoms resolved quickly. INR was subtherapeutic at admission.  Warfarin was stopped and he was put on apixaban.   In 9/17, he was admitted with LLL PNA.  During the admission, he had a couple of relatively short runs of atrial fibrillation.  He developed more frequent episodes of atrial fibrillation at home and was started on Tikosyn.   In 5/19, he had bilateral orchiectomy for metastatic prostate cancer.  His PSA has come down considerably.   In 1/20, he had influenza that progressed to PNA.  He was hospitalized with transient  atrial fibrillation.    In 11/20, he was admitted with an E coli UTI and urosepsis with hypotension and AKI.  He was initially fluid-resuscitated and later required diuresis.  He was in atrial fibrillation while in the hospital.   Patient was admitted in 1/23 with influenza A as well as Strep pneumoniae PNA.  He was treated with oseltamivir and antibiotics. While in the hospital, he went into atrial fibrillation with controlled rate.    He went into atrial fibrillation in 12/24 around the time of his sister's death, may have been triggered by stress. He went back into NSR spontaneously.   Patient returns for followup of atrial fibrillation.  He is in NSR today. Weight down 3 lbs. He continues to use a Systems analyst twice a week.  Mild lightheadedness if he stands too fast, no falls. No chest pain.  He gets bilateral groin pain when he walks longer distances.  Dyspnea walking up hills.  Does ok on flat ground walking slowly and going up a flight of stairs.    ECG (personally reviewed): NSR, QTc 491 msec, Q in lead III    Labs (6/11): LDL 63, HDL 35, K 5.5, creatinine 1.1 Labs (6/12): LDL 58, HDL 53, TSH normal, K 4.7, creatinine 1 Labs (12/12): BNP 79 Labs (7/14): K 5, creatinine 1.1, LDL 48, HDL 34 Labs (8/15): LDL 48, HDL 33, TSH normal, K 5.4, creatinine 1.2 Labs (6/16): K 5.1,  creatinine 1.27, HCT 42.5 Labs (10/16): LDL 37, HDL 32 Labs (11/16): K 4.4, creatinine 1.36, BNP 103 Labs  (2/17): K 4.5, creatinine 1.38, Mg 2.0, LDL 32 Labs (3/17): HCT 41.7 Labs (5/17): K 4.2, creatinine 1.17, Mg 2.1 Labs (9/17): K 4.2, creatinine 1.16, HCT 37.3 Labs (11/17): hgb 13.3, LDL 40 Labs (5/18): K 4.5, creatinine 1.25 Labs (5/19): K 5.5, creatinine 1.16 Labs (2/20): K 4, creatinine 1.24, hgb 12.2 Labs (11/20): K 3.4, creatinine 1.34, hgb 10.3, LDL 49 Labs (12/20): K 4.5, creatinine 1.2 Labs (2/21): K 3.9, creatinine 1.5, TSH 4.5 (mildly elevated) Labs (5/21): TSH normal, K 5, creatinine  1.27 Labs (9/21): K 4.1, creatinine 1.22, LDL 40, TSH normal Labs (4/22): K 4.2, creatinine 1.11 Labs (8/22): hgb 12, LDL 36 Labs (10/22): K 4.5, creatinine 1.38 Labs (1/23): K 4.6, creatinine 1.15, hgb 11.4 Labs (4/23): LDL 37 Labs (7/23): hgb 7.8, K 4.5, creatinine 1.14 Labs (8/23): hgb 9 Labs (1/24): LDL 29 Labs (3/24): K 3.6, creatinine 0.96 Labs (9/24): K 4.5, 1.03, hgb 10.9 Labs (12/24): hgb 11.5, K 4.5, creatinine 1.26, Mg 2.0, BNP 284, TSH normal  PMH: 1. CAD: RV infarct in the 1980s.  Chronic total occlusion of a small nondominant RCA.  11/10 PCI to CFX with Xience DES.  EF 60% on LV-gram at that time. ETT (11/14) with 9'07" exercise, no significant ST depression.  2.  Paroxysmal atrial fibrillation: Multaq was ineffective. Now on Tikosyn.  3.  Hyperlipidemia 4. GERD 5. HTN 6. Asthma 7. Chronic diastolic CHF: Echo (6/12): EF 16-10%, mild LV hypertrophy, no regional WMAs, mild AI, mild MR, mild-moderate biatrial enlargement, PA systolic pressure 51 mmHg. Echo (7/16) with EF 60-65%, moderate diastolic dysfunction, normal RV size and systolic function, mild-moderate MR, moderate TR, moderate biatrial enlargement, PA systolic pressure 65 mmHg.  - Echo (5/18): EF 55-60%, mild AI, mild MR, PASP 60 mmHg, normal RV size and systolic function.  - Echo (8/19): EF 60-65%, normal RV size and systolic function, severe TR with PASP 60 mmHg.  8. CKD 9. CVA: Right parietal cortical CVA with peri-infarct hemorrhagic conversion.  CTA showed right M2 branch flow gap.   10. Prostate cancer: Metastatic.  - Bilateral orchiectomy in 5/19.   SH: Nonsmoker.  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  Originally from [place].   FH: Brother with CABG.   ROS: All systems reviewed and negative except as per HPI.    Current Outpatient Medications  Medication Sig Dispense Refill   apixaban (ELIQUIS) 5 MG TABS tablet Take 1 tablet (5 mg total) by mouth 2 (two) times daily. 180 tablet  3   atorvastatin (LIPITOR) 20 MG tablet TAKE 1 TABLET BY MOUTH EVERY DAY 90 tablet 1   beclomethasone (BECONASE-AQ) 42 MCG/SPRAY nasal spray Place 1 spray into both nostrils daily as needed for allergies.     buPROPion (WELLBUTRIN SR) 150 MG 12 hr tablet TAKE 1 TABLET(150 MG) BY MOUTH DAILY 90 tablet 0   cetirizine (ZYRTEC) 10 MG tablet Take 10 mg by mouth daily.     Cholecalciferol (VITAMIN D-3 PO) Take 1 capsule by mouth at bedtime.     denosumab (PROLIA) 60 MG/ML SOSY injection Inject 60 mg into the skin every 6 (six) months.     dofetilide (TIKOSYN) 250 MCG capsule TAKE 1 CAPSULE(250 MCG) BY MOUTH TWICE DAILY 180 capsule 1   FLUoxetine (PROZAC) 10 MG capsule TAKE 1 CAPSULE(10 MG) BY MOUTH DAILY 90 capsule 1   Fluticasone Furoate (ARNUITY ELLIPTA) 200  MCG/ACT AEPB Inhale 1 puff into the lungs daily. 60 each 5   Lactase (LACTAID PO) Take 1 tablet by mouth 3 (three) times daily with meals as needed (lactose intolerance).     LORazepam (ATIVAN) 1 MG tablet TAKE 1/2 TO 1 TABLET(0.5 TO 1 MG) BY MOUTH AT BEDTIME AS NEEDED 90 tablet 1   omeprazole (PRILOSEC OTC) 20 MG tablet Take 1 tablet (20 mg total) by mouth daily. 100 tablet 4   oxyCODONE (OXY IR/ROXICODONE) 5 MG immediate release tablet Take 1 tablet (5 mg total) by mouth every 6 (six) hours as needed for severe pain. 10 tablet 0   Polyethyl Glycol-Propyl Glycol (SYSTANE OP) Place 1 drop into both eyes 4 (four) times daily as needed (dry eye).     tamsulosin (FLOMAX) 0.4 MG CAPS capsule TAKE 1 CAPSULE(0.4 MG) BY MOUTH DAILY 90 capsule 1   traMADol (ULTRAM) 50 MG tablet Take 1 tablet (50 mg total) by mouth every 6 (six) hours as needed. 30 tablet 0   No current facility-administered medications for this encounter.    BP (!) 92/50   Pulse 78   Wt 65.9 kg (145 lb 3.2 oz)   SpO2 98%   BMI 20.98 kg/m  General: NAD Neck: No JVD, no thyromegaly or thyroid nodule.  Lungs: Clear to auscultation bilaterally with normal respiratory effort. CV:  Nondisplaced PMI.  Heart regular S1/S2, no S3/S4, 2/6 HSM LLSB.  No peripheral edema.  No carotid bruit.  Difficult to palpate pedal pulses.  Abdomen: Soft, nontender, no hepatosplenomegaly, no distention.  Skin: Intact without lesions or rashes.  Neurologic: Alert and oriented x 3.  Psych: Normal affect. Extremities: No clubbing or cyanosis.  HEENT: Normal.   Assessment/Plan: 1. CAD: He has had chronic stable angina walking up a steep hill (manifests as left shoulder pain).  Has not had this recently but not as active.  No change in pattern x 20 years.   - Continue medical management of angina, no indication for stress test or cath.  - He is on apixaban without ASA given stable CAD.   - Continue statin, check lipids today.    2. Hyperlipidemia: He is on atorvastatin 20 mg daily.  3. Chronic diastolic CHF:  Most recent echo in 8/19 showed EF 60-65% but PA systolic pressure was estimated at 60 mmHg and there was severe TR (progressed from moderate), the RV looked normal.  I suspect that he has a degree of RV failure.  He takes Lasix prn, no recent use.  He is not volume overloaded by exam. NYHA class II-III    - He has wanted to hold off on repeat echo.  Given age, think this is reasonable as unlikely to change management much.   - He can continue current prn Lasix use, has not needed recently.  4. Atrial fibrillation: Paroxysmal.  Episodes are quite symptomatic (fatigue and dizziness) and often triggered by some form of physical or emotional stress, he feels "bad" with atrial fibrillation even when he's rate-controlled. He had frequent breakthrough on Multaq but and is now on Tikosyn.  QTc ok on today's ECG.  He is now off diltiazem CD and atenolol due to low BP and orthostasis.  He is in NSR today.  - Continue Tikosyn, QTc ok on today's ECG.  Recent BMET, Mg ok.   - Continue Eliquis. Recent CBC stable.  5. Tricuspid regurgitation: Severe on 8/19 echo, but no significant murmur noted on exam  today.  Not candidate for surgical TVR  and not interested in referral to trial for percutaneous repair.  6. Anemia: H/o Fe deficiency, has had IV Fe in past.  - Check Fe studies.  7. ?Claudication: Bilateral groin pain with exertion could be related to claudication. Symptoms occur only with heavier exertion.  We discussed evaluation by ABIs, but probably would not change management (already on statin and he is not having rest pain or pedal ulcerations), so he would rather hold off.   Followup in 4 months.   I spent 22 minutes reviewing records, interviewing/examining patient, and managing orders.    Marca Ancona 03/13/2023

## 2023-03-23 ENCOUNTER — Other Ambulatory Visit: Payer: Self-pay | Admitting: Family Medicine

## 2023-03-30 DIAGNOSIS — H02005 Unspecified entropion of left lower eyelid: Secondary | ICD-10-CM | POA: Diagnosis not present

## 2023-03-30 DIAGNOSIS — H0100A Unspecified blepharitis right eye, upper and lower eyelids: Secondary | ICD-10-CM | POA: Diagnosis not present

## 2023-03-30 DIAGNOSIS — H0100B Unspecified blepharitis left eye, upper and lower eyelids: Secondary | ICD-10-CM | POA: Diagnosis not present

## 2023-03-30 DIAGNOSIS — H02002 Unspecified entropion of right lower eyelid: Secondary | ICD-10-CM | POA: Diagnosis not present

## 2023-04-03 ENCOUNTER — Encounter: Payer: Self-pay | Admitting: Internal Medicine

## 2023-04-12 ENCOUNTER — Other Ambulatory Visit: Payer: Self-pay | Admitting: Family Medicine

## 2023-05-05 ENCOUNTER — Other Ambulatory Visit: Payer: Self-pay | Admitting: Family Medicine

## 2023-05-06 ENCOUNTER — Other Ambulatory Visit: Payer: Self-pay

## 2023-05-06 ENCOUNTER — Encounter (HOSPITAL_COMMUNITY): Payer: Self-pay | Admitting: Emergency Medicine

## 2023-05-06 ENCOUNTER — Observation Stay (HOSPITAL_COMMUNITY)

## 2023-05-06 ENCOUNTER — Emergency Department (HOSPITAL_COMMUNITY)

## 2023-05-06 ENCOUNTER — Inpatient Hospital Stay (HOSPITAL_COMMUNITY)
Admission: EM | Admit: 2023-05-06 | Discharge: 2023-05-09 | DRG: 312 | Disposition: A | Discharge status: Home Health | Attending: Internal Medicine | Admitting: Internal Medicine

## 2023-05-06 DIAGNOSIS — I2510 Atherosclerotic heart disease of native coronary artery without angina pectoris: Secondary | ICD-10-CM

## 2023-05-06 DIAGNOSIS — I959 Hypotension, unspecified: Secondary | ICD-10-CM | POA: Diagnosis not present

## 2023-05-06 DIAGNOSIS — I639 Cerebral infarction, unspecified: Secondary | ICD-10-CM | POA: Diagnosis not present

## 2023-05-06 DIAGNOSIS — E739 Lactose intolerance, unspecified: Secondary | ICD-10-CM | POA: Diagnosis present

## 2023-05-06 DIAGNOSIS — I5032 Chronic diastolic (congestive) heart failure: Secondary | ICD-10-CM | POA: Diagnosis present

## 2023-05-06 DIAGNOSIS — I272 Pulmonary hypertension, unspecified: Secondary | ICD-10-CM | POA: Diagnosis present

## 2023-05-06 DIAGNOSIS — I480 Paroxysmal atrial fibrillation: Secondary | ICD-10-CM | POA: Diagnosis present

## 2023-05-06 DIAGNOSIS — S199XXA Unspecified injury of neck, initial encounter: Secondary | ICD-10-CM | POA: Diagnosis not present

## 2023-05-06 DIAGNOSIS — S0990XA Unspecified injury of head, initial encounter: Secondary | ICD-10-CM | POA: Diagnosis not present

## 2023-05-06 DIAGNOSIS — Z9861 Coronary angioplasty status: Secondary | ICD-10-CM

## 2023-05-06 DIAGNOSIS — K219 Gastro-esophageal reflux disease without esophagitis: Secondary | ICD-10-CM | POA: Diagnosis present

## 2023-05-06 DIAGNOSIS — Z96653 Presence of artificial knee joint, bilateral: Secondary | ICD-10-CM | POA: Diagnosis present

## 2023-05-06 DIAGNOSIS — Z825 Family history of asthma and other chronic lower respiratory diseases: Secondary | ICD-10-CM

## 2023-05-06 DIAGNOSIS — E785 Hyperlipidemia, unspecified: Secondary | ICD-10-CM | POA: Diagnosis present

## 2023-05-06 DIAGNOSIS — Z7901 Long term (current) use of anticoagulants: Secondary | ICD-10-CM

## 2023-05-06 DIAGNOSIS — R0609 Other forms of dyspnea: Secondary | ICD-10-CM | POA: Diagnosis not present

## 2023-05-06 DIAGNOSIS — Z8673 Personal history of transient ischemic attack (TIA), and cerebral infarction without residual deficits: Secondary | ICD-10-CM | POA: Diagnosis not present

## 2023-05-06 DIAGNOSIS — Z886 Allergy status to analgesic agent status: Secondary | ICD-10-CM

## 2023-05-06 DIAGNOSIS — I350 Nonrheumatic aortic (valve) stenosis: Secondary | ICD-10-CM | POA: Diagnosis present

## 2023-05-06 DIAGNOSIS — F32A Depression, unspecified: Secondary | ICD-10-CM | POA: Diagnosis not present

## 2023-05-06 DIAGNOSIS — D649 Anemia, unspecified: Secondary | ICD-10-CM | POA: Diagnosis present

## 2023-05-06 DIAGNOSIS — R079 Chest pain, unspecified: Secondary | ICD-10-CM | POA: Diagnosis not present

## 2023-05-06 DIAGNOSIS — I252 Old myocardial infarction: Secondary | ICD-10-CM

## 2023-05-06 DIAGNOSIS — I10 Essential (primary) hypertension: Secondary | ICD-10-CM | POA: Diagnosis present

## 2023-05-06 DIAGNOSIS — R55 Syncope and collapse: Principal | ICD-10-CM

## 2023-05-06 DIAGNOSIS — Z9049 Acquired absence of other specified parts of digestive tract: Secondary | ICD-10-CM

## 2023-05-06 DIAGNOSIS — I951 Orthostatic hypotension: Secondary | ICD-10-CM | POA: Diagnosis not present

## 2023-05-06 DIAGNOSIS — I130 Hypertensive heart and chronic kidney disease with heart failure and stage 1 through stage 4 chronic kidney disease, or unspecified chronic kidney disease: Secondary | ICD-10-CM | POA: Diagnosis present

## 2023-05-06 DIAGNOSIS — J329 Chronic sinusitis, unspecified: Secondary | ICD-10-CM | POA: Diagnosis not present

## 2023-05-06 DIAGNOSIS — Z8249 Family history of ischemic heart disease and other diseases of the circulatory system: Secondary | ICD-10-CM

## 2023-05-06 DIAGNOSIS — N179 Acute kidney failure, unspecified: Secondary | ICD-10-CM

## 2023-05-06 DIAGNOSIS — Z955 Presence of coronary angioplasty implant and graft: Secondary | ICD-10-CM

## 2023-05-06 DIAGNOSIS — Z961 Presence of intraocular lens: Secondary | ICD-10-CM | POA: Diagnosis present

## 2023-05-06 DIAGNOSIS — J45909 Unspecified asthma, uncomplicated: Secondary | ICD-10-CM | POA: Diagnosis present

## 2023-05-06 DIAGNOSIS — I700 Atherosclerosis of aorta: Secondary | ICD-10-CM | POA: Diagnosis not present

## 2023-05-06 DIAGNOSIS — N1831 Chronic kidney disease, stage 3a: Secondary | ICD-10-CM | POA: Diagnosis present

## 2023-05-06 DIAGNOSIS — Z7951 Long term (current) use of inhaled steroids: Secondary | ICD-10-CM

## 2023-05-06 DIAGNOSIS — Z79899 Other long term (current) drug therapy: Secondary | ICD-10-CM

## 2023-05-06 DIAGNOSIS — Z881 Allergy status to other antibiotic agents status: Secondary | ICD-10-CM

## 2023-05-06 DIAGNOSIS — R296 Repeated falls: Secondary | ICD-10-CM | POA: Diagnosis present

## 2023-05-06 DIAGNOSIS — W19XXXA Unspecified fall, initial encounter: Secondary | ICD-10-CM | POA: Diagnosis not present

## 2023-05-06 LAB — COMPREHENSIVE METABOLIC PANEL WITH GFR
ALT: 16 U/L (ref 0–44)
AST: 18 U/L (ref 15–41)
Albumin: 2.8 g/dL — ABNORMAL LOW (ref 3.5–5.0)
Alkaline Phosphatase: 64 U/L (ref 38–126)
Anion gap: 8 (ref 5–15)
BUN: 29 mg/dL — ABNORMAL HIGH (ref 8–23)
CO2: 24 mmol/L (ref 22–32)
Calcium: 8.6 mg/dL — ABNORMAL LOW (ref 8.9–10.3)
Chloride: 103 mmol/L (ref 98–111)
Creatinine, Ser: 1.46 mg/dL — ABNORMAL HIGH (ref 0.61–1.24)
GFR, Estimated: 45 mL/min — ABNORMAL LOW (ref >60)
Glucose, Bld: 130 mg/dL — ABNORMAL HIGH (ref 70–99)
Potassium: 4.3 mmol/L (ref 3.5–5.1)
Sodium: 135 mmol/L (ref 135–145)
Total Bilirubin: 0.7 mg/dL (ref 0.0–1.2)
Total Protein: 5.6 g/dL — ABNORMAL LOW (ref 6.5–8.1)

## 2023-05-06 LAB — CBC
HCT: 28.9 % — ABNORMAL LOW (ref 39.0–52.0)
Hemoglobin: 9.3 g/dL — ABNORMAL LOW (ref 13.0–17.0)
MCH: 31.1 pg (ref 26.0–34.0)
MCHC: 32.2 g/dL (ref 30.0–36.0)
MCV: 96.7 fL (ref 80.0–100.0)
Platelets: 227 10*3/uL (ref 150–400)
RBC: 2.99 MIL/uL — ABNORMAL LOW (ref 4.22–5.81)
RDW: 13.7 % (ref 11.5–15.5)
WBC: 12.1 10*3/uL — ABNORMAL HIGH (ref 4.0–10.5)
nRBC: 0 % (ref 0.0–0.2)

## 2023-05-06 LAB — ETHANOL: Alcohol, Ethyl (B): <10 mg/dL (ref <10)

## 2023-05-06 LAB — ECHOCARDIOGRAM COMPLETE
AR max vel: 1.31 cm2
AV Area VTI: 1.21 cm2
AV Area mean vel: 1.23 cm2
AV Mean grad: 15 mmHg
AV Peak grad: 27 mmHg
Ao pk vel: 2.6 m/s
Area-P 1/2: 3.27 cm2
Height: 70 in
MV M vel: 4.76 m/s
MV Peak grad: 90.6 mmHg
P 1/2 time: 349 ms
S' Lateral: 2.5 cm

## 2023-05-06 LAB — SAMPLE TO BLOOD BANK

## 2023-05-06 LAB — PROTIME-INR
INR: 1.6 — ABNORMAL HIGH (ref 0.8–1.2)
Prothrombin Time: 19 s — ABNORMAL HIGH (ref 11.4–15.2)

## 2023-05-06 MED ORDER — APIXABAN 5 MG PO TABS
5.0000 mg | ORAL_TABLET | Freq: Two times a day (BID) | ORAL | Status: DC
Start: 2023-05-06 — End: 2023-05-09
  Administered 2023-05-06 – 2023-05-09 (×7): 5 mg via ORAL
  Filled 2023-05-06 (×7): qty 1

## 2023-05-06 MED ORDER — LACTASE 3000 UNITS PO TABS
3000.0000 [IU] | ORAL_TABLET | Freq: Three times a day (TID) | ORAL | Status: DC
  Administered 2023-05-06 – 2023-05-07 (×2): 3000 [IU] via ORAL
  Filled 2023-05-06 (×11): qty 1

## 2023-05-06 MED ORDER — LORAZEPAM 0.5 MG PO TABS
0.5000 mg | ORAL_TABLET | Freq: Every evening | ORAL | Status: DC | PRN
  Administered 2023-05-06 – 2023-05-08 (×3): 0.5 mg via ORAL
  Filled 2023-05-06 (×3): qty 1

## 2023-05-06 MED ORDER — FLUOXETINE HCL 10 MG PO CAPS
10.0000 mg | ORAL_CAPSULE | Freq: Every day | ORAL | Status: DC
  Administered 2023-05-06 – 2023-05-09 (×4): 10 mg via ORAL
  Filled 2023-05-06 (×4): qty 1

## 2023-05-06 MED ORDER — ATORVASTATIN CALCIUM 10 MG PO TABS
20.0000 mg | ORAL_TABLET | Freq: Every day | ORAL | Status: DC
  Administered 2023-05-06 – 2023-05-09 (×4): 20 mg via ORAL
  Filled 2023-05-06 (×4): qty 2

## 2023-05-06 MED ORDER — LACTATED RINGERS IV BOLUS
1000.0000 mL | Freq: Once | INTRAVENOUS | Status: AC
  Administered 2023-05-06: 1000 mL via INTRAVENOUS

## 2023-05-06 MED ORDER — DOFETILIDE 250 MCG PO CAPS
250.0000 ug | ORAL_CAPSULE | Freq: Two times a day (BID) | ORAL | Status: DC
  Administered 2023-05-06 – 2023-05-09 (×7): 250 ug via ORAL
  Filled 2023-05-06 (×8): qty 1

## 2023-05-06 MED ORDER — POLYETHYL GLYCOL-PROPYL GLYCOL 0.4-0.3 % OP GEL: Freq: Four times a day (QID) | OPHTHALMIC | Status: DC | PRN

## 2023-05-06 MED ORDER — LORATADINE 10 MG PO TABS
10.0000 mg | ORAL_TABLET | Freq: Every day | ORAL | Status: DC
  Administered 2023-05-06 – 2023-05-09 (×4): 10 mg via ORAL
  Filled 2023-05-06 (×4): qty 1

## 2023-05-06 MED ORDER — OMEPRAZOLE 20 MG PO CPDR
20.0000 mg | DELAYED_RELEASE_CAPSULE | Freq: Every day | ORAL | Status: DC
  Administered 2023-05-06 – 2023-05-09 (×4): 20 mg via ORAL
  Filled 2023-05-06 (×4): qty 1

## 2023-05-06 MED ORDER — TAMSULOSIN HCL 0.4 MG PO CAPS
0.4000 mg | ORAL_CAPSULE | Freq: Every day | ORAL | Status: DC
  Administered 2023-05-06 – 2023-05-07 (×2): 0.4 mg via ORAL
  Filled 2023-05-06 (×2): qty 1

## 2023-05-06 MED ORDER — LACTATED RINGERS IV BOLUS
1000.0000 mL | Freq: Once | INTRAVENOUS | Status: AC
Start: 2023-05-06 — End: 2023-05-06
  Administered 2023-05-06: 1000 mL via INTRAVENOUS

## 2023-05-06 MED ORDER — BUPROPION HCL ER (SR) 150 MG PO TB12
150.0000 mg | ORAL_TABLET | Freq: Every day | ORAL | Status: DC
  Administered 2023-05-06 – 2023-05-09 (×4): 150 mg via ORAL
  Filled 2023-05-06 (×4): qty 1

## 2023-05-06 MED ORDER — BUDESONIDE 0.25 MG/2ML IN SUSP
2.0000 mL | Freq: Two times a day (BID) | RESPIRATORY_TRACT | Status: DC
  Administered 2023-05-06 – 2023-05-09 (×7): 0.25 mg via RESPIRATORY_TRACT
  Filled 2023-05-06 (×8): qty 2

## 2023-05-06 NOTE — Assessment & Plan Note (Signed)
 Renal function stable, continue close monitoring of electrolytes.

## 2023-05-06 NOTE — Assessment & Plan Note (Signed)
 Continue statin. He is on full anticoagulation with apixaban,.

## 2023-05-06 NOTE — Assessment & Plan Note (Signed)
 Currently patient on sinus rhythm, but apparently has severe symptoms when in atrial fibrillation.  Plan to continue dofetilide for rate and rhythm control Continue anticoagulation with apixaban.  Continue telemetry monitoring.

## 2023-05-06 NOTE — Assessment & Plan Note (Signed)
 Continue blood pressure monitoring. Not on antihypertensive medications at home.

## 2023-05-06 NOTE — ED Provider Notes (Signed)
  ED Course / MDM   Clinical Course as of 05/06/23 0846  Fri May 06, 2023  0725 Received sign out pending admission. Hx CHF, orthostatic CHF. Had multiple episodes of syncope, fell today and hit head. Still orthostatic after fluids. Labs with AKI. Having trouble ambulating.  [WS]  [phone] Patient admitted by Dr. Ella Jubilee [WS]    Clinical Course User Index [WS] Suezanne Jacquet Jerilee Field, MD   Medical Decision Making Amount and/or Complexity of Data Reviewed Labs: ordered. Radiology: ordered.  Risk Decision regarding hospitalization.         Lonell Grandchild, MD 05/06/23 [phone]

## 2023-05-06 NOTE — H&P (Signed)
 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 For the last 4 days he has noted dizziness and lightheadedness while standing rapidly, can be severe at times experiencing near syncope, but per his recollection he has had no loss of consciousness. Symptoms improved with seating.   Last night he went to the bathroom after being supine in bed. When standing he felt lightheaded and dizzy, he intended to seat on the commode, but he missed and landed on the floor. While falling he hit the back of his head with the bathtub. He had no loss of consciousness.  EMS was called and patient was transported to the ED.  Per his wife he has been falling frequently up to 4 times over the last 2 days.   No recent change in his medications, no nausea or vomiting, no diarrhea. No change in po intake. No fevers or chills. Apparently increases urinary frequency.   Old records personally reviewed last cardiology visit 03/11/23, noted pulmonary hypertension, with severe tricuspid regurgitation. Taking as needed furosemide.  Symptomatic paroxysmal atrial fibrillation triggered by physical or emotional stress. In the past atenolol and diltiazem were discontinued due to low BP and orthostatic symptoms.   Review of Systems: As mentioned in the history of present illness. All other systems reviewed and are negative. Past Medical History:  Diagnosis Date   Acute blood loss anemia 08/27/2021   Acute  hemorrhagic infarction of brain [facility])    Allergic rhinitis    Anxiety    Asthma    Atrial fibrillation (HCC)    CAD (coronary artery disease)    CHF (congestive heart failure) (HCC)    Depression    Diverticulosis 03/16/1995   DJD (degenerative joint disease)    "most joints" (03/11/2015)   GERD (gastroesophageal reflux disease) 12/30/2000   Heart attack (HCC) 1984   Hiatal hernia 12/30/2000   History of blood transfusion 1988   "related to GI bleeding OR"   History of duodenal ulcer 10/15/1986   Hyperlipidemia    Hypertension    ICH (intracerebral hemorrhage) (HCC)    Sepsis (HCC) 12/2018   Sinus bradycardia    Stroke [facility])    Past Surgical History:  Procedure Laterality Date   CARDIAC CATHETERIZATION     CATARACT EXTRACTION W/ INTRAOCULAR LENS  IMPLANT, BILATERAL Bilateral ?2013   CHOLECYSTECTOMY OPEN  1989   CORONARY ANGIOPLASTY WITH STENT PLACEMENT  2010   hospital ccu  1984   heart attack, hypoplastic right coronary   INGUINAL HERNIA REPAIR Right ~ 1990   NASAL SEPTUM SURGERY  1972   ORCHIECTOMY Bilateral 06/10/2017   Procedure: BILATERAL ORCHIECTOMY;  Surgeon: Marcine Matar, MD;  Location: [facility] ORS;  Service: Urology;  Laterality: Bilateral;  MAC ANESTHESIA AND LOCAL   TONSILLECTOMY     TOTAL KNEE ARTHROPLASTY Bilateral 1998-2004   "right-left"   VAGOTOMY AND PYLOROPLASTY  1988   "bleeding duodenal ulcer"   Social History:  reports that he has never smoked. He has never used smokeless  tobacco. He reports that he does not drink alcohol and does not use drugs.  Allergies  Allergen Reactions   Tylenol [Acetaminophen] Rash   Lactose Intolerance (Gi) Diarrhea and Nausea Only   Levaquin [Levofloxacin]     Triggered A-fib    Advil [Ibuprofen] Nausea Only and Rash   Asa [Aspirin] Rash    Family History  Problem Relation Age of Onset   Coronary artery disease Mother        deceased   Deep vein thrombosis Sister    Coronary artery disease Brother    Asthma  Brother    Colon cancer Neg Hx     Prior to Admission medications   Medication Sig Start Date End Date Taking? Authorizing Provider  apixaban (ELIQUIS) 5 MG TABS tablet Take 1 tablet (5 mg total) by mouth 2 (two) times daily. 02/28/23  Yes Laurey Morale, MD  atorvastatin (LIPITOR) 20 MG tablet TAKE 1 TABLET BY MOUTH EVERY DAY 11/01/22  Yes Burchette, Elberta Fortis, MD  buPROPion [facility] SR) 150 MG 12 hr tablet TAKE 1 TABLET(150 MG) BY MOUTH DAILY 05/05/23  Yes Burchette, Elberta Fortis, MD  cetirizine (ZYRTEC) 10 MG tablet Take 10 mg by mouth daily.   Yes [provider]  Cholecalciferol (VITAMIN D-3 PO) Take 1 capsule by mouth at bedtime.   Yes [provider]  denosumab (PROLIA) 60 MG/ML SOSY injection Inject 60 mg into the skin every 6 (six) months.   Yes [provider]  dofetilide (TIKOSYN) 250 MCG capsule TAKE 1 CAPSULE(250 MCG) BY MOUTH TWICE DAILY 01/28/23  Yes Laurey Morale, MD  FLUoxetine (PROZAC) 10 MG capsule TAKE 1 CAPSULE(10 MG) BY MOUTH DAILY 11/01/22  Yes Burchette, Elberta Fortis, MD  Fluticasone Furoate (ARNUITY ELLIPTA) 200 MCG/ACT AEPB Inhale 1 puff into the lungs daily. 11/19/22  Yes Burchette, Elberta Fortis, MD  Lactase (LACTAID PO) Take 1 tablet by mouth 3 (three) times daily with meals as needed (lactose intolerance).   Yes [provider]  LORazepam (ATIVAN) 1 MG tablet TAKE 1/2 TO 1 TABLET(0.5 TO 1 MG) BY MOUTH AT BEDTIME AS NEEDED 03/24/23  Yes Burchette, Elberta Fortis, MD  omeprazole (PRILOSEC OTC) 20 MG tablet Take 1 tablet (20 mg total) by mouth daily. 09/17/13  Yes [name], MD  Propylene Glycol (SYSTANE BALANCE OP) Place 1 drop into both eyes daily as needed (for dry eyes).   Yes [provider]  tamsulosin (FLOMAX) 0.4 MG CAPS capsule TAKE 1 CAPSULE(0.4 MG) BY MOUTH DAILY 11/01/22  Yes Burchette, Elberta Fortis, MD  traMADol (ULTRAM) 50 MG tablet Take 1 tablet (50 mg total) by mouth every 6 (six) hours as needed. Patient taking differently: Take  50 mg by mouth every 6 (six) hours as needed for moderate pain (pain score 4-6) (and headache). 04/13/23  Yes Burchette, Elberta Fortis, MD  Polyethyl Glycol-Propyl Glycol (SYSTANE OP) Place 1 drop into both eyes 4 (four) times daily as needed (dry eye).    [provider]    Physical Exam: Vitals:   05/06/23 1045 05/06/23 1115 05/06/23 1119 05/06/23 1309  BP: 94/72 (!) 113/56  122/71  Pulse: 90 83  89  Resp: 15 16  18   Temp:   98.5 F (36.9 C) 98.4 F (36.9 C)  TempSrc:   Oral Oral  SpO2: 98% 97%  100%   Neurology awake and alert ENT with mild pallor, oral mucosa mild dry Cardiovascular with S1 and S2 present and regular with no gallops, or rubs,  positive systolic murmur at the right lower sternal border.  No JVD No lower extremity edema Respiratory with no rales or wheezing, no rhonchi Abdomen with no distention  Data Reviewed:   Na 135, K 4,3 Cl 103 bicarbonate 24 glucose 130, bun 29 cr 1,46  AST 18 ALT 16  Wbc 12.1 hgb 9,3 plt 227  INR 1,6  Alcohol < 10   Head and cervical spine CT with no acute changes.   Chest radiograph with no cardiomegaly, noted bilateral hilar vascular congestion with no infiltrated or effusions.   EKG 98 bpm, normal axis, normal intervals, qtc 442, sinus rhythm with PAC, no significant ST segment or T wave changes.   Assessment and Plan: * Syncope Patient with history of orthostatic hypotension in the past.  His symptoms seem to be orthostatic.   Plan to continue check orthostatics.  Patient had 1 L IV fluids in the ED.  PT and Ot evaluations  Continue telemetry monitoring.   Paroxysmal atrial fibrillation (HCC) Currently patient on sinus rhythm, but apparently has severe symptoms when in atrial fibrillation.  Plan to continue dofetilide for rate and rhythm control Continue anticoagulation with apixaban.  Continue telemetry monitoring.   Essential hypertension Continue blood pressure monitoring. Not on antihypertensive medications  at home.  Stage 3a chronic kidney disease (CKD) (HCC) Renal function stable, continue close monitoring of electrolytes.   CAD S/P OM PCI 2010 No chest pain or angina, continue close monitoring.  Continue atorvastatin for dyslipidemia.   Pulmonary hypertension (HCC) Continue oxymetry monitoring.   Chronic diastolic heart failure (HCC) No signs of acute decompensation, will continue rhythm control atrial fibrillation.  Check echocardiogram.  Apparently limited therapy due to hypotension.   GERD Continue PPI  History of CVA (cerebrovascular accident) Continue statin. He is on full anticoagulation with apixaban,.  Depression Continue with bupropion and duloxetine.  Lorazepam at night for sleep.       Advance Care Planning:   Code Status: Full Code   Consults: none   Family Communication: no family at the bedside   Severity of Illness: The appropriate patient status for this patient is OBSERVATION. Observation status is judged to be reasonable and necessary in order to provide the required intensity of service to ensure the patient's safety. The patient's presenting symptoms, physical exam findings, and initial radiographic and laboratory data in the context of their medical condition is felt to place them at decreased risk for further clinical deterioration. Furthermore, it is anticipated that the patient will be medically stable for discharge from the hospital within 2 midnights of admission.   Author: Coralie Keens, MD 05/06/2023 1:15 PM  For on call review [email or web address].

## 2023-05-06 NOTE — Assessment & Plan Note (Signed)
 No chest pain or angina, continue close monitoring.  Continue atorvastatin for dyslipidemia.

## 2023-05-06 NOTE — ED Notes (Signed)
 Pt needs 2 person assist when ambulating with walker. EDP made aware

## 2023-05-06 NOTE — Assessment & Plan Note (Signed)
 Continue with bupropion and duloxetine.  Lorazepam at night for sleep.

## 2023-05-06 NOTE — Assessment & Plan Note (Signed)
 Continue oxymetry monitoring.

## 2023-05-06 NOTE — ED Provider Notes (Signed)
 [place] EMERGENCY DEPARTMENT AT [facility] Provider Note   CSN: [id number] Arrival date & time: 05/06/23  0209     History  Chief Complaint  Patient presents with   Cyndie Mull, MD is a 89 y.o. male.  89 year old male that presents the ER today secondary to syncope, generalized weakness.  Patient does not drink as much water as he should and has a history of some orthostatic hypotension but rarely syncopized as with it.  He had 4 episodes of syncope in the last 4 days.  Today he hit his head.  No nausea vomiting or persistent neurologic symptoms afterwards but cannot walk secondary to weakness.  Usually walks with a walker without too much trouble.  No urinary symptoms.  No palpitations, chest pain, back pain or any other associated symptoms.  On Eliquis for A-fib.   Fall       Home Medications Prior to Admission medications   Medication Sig Start Date End Date Taking? Authorizing Provider  apixaban (ELIQUIS) 5 MG TABS tablet Take 1 tablet (5 mg total) by mouth 2 (two) times daily. 02/28/23  Yes Laurey Morale, MD  atorvastatin (LIPITOR) 20 MG tablet TAKE 1 TABLET BY MOUTH EVERY DAY 11/01/22  Yes Burchette, Elberta Fortis, MD  buPROPion [facility] SR) 150 MG 12 hr tablet TAKE 1 TABLET(150 MG) BY MOUTH DAILY 05/05/23  Yes Burchette, Elberta Fortis, MD  cetirizine (ZYRTEC) 10 MG tablet Take 10 mg by mouth daily.   Yes [provider]  Cholecalciferol (VITAMIN D-3 PO) Take 1 capsule by mouth at bedtime.   Yes [provider]  denosumab (PROLIA) 60 MG/ML SOSY injection Inject 60 mg into the skin every 6 (six) months.   Yes [provider]  dofetilide (TIKOSYN) 250 MCG capsule TAKE 1 CAPSULE(250 MCG) BY MOUTH TWICE DAILY 01/28/23  Yes Laurey Morale, MD  FLUoxetine (PROZAC) 10 MG capsule TAKE 1 CAPSULE(10 MG) BY MOUTH DAILY 11/01/22  Yes Burchette, Elberta Fortis, MD  Fluticasone Furoate (ARNUITY ELLIPTA) 200 MCG/ACT AEPB Inhale 1 puff into the lungs  daily. 11/19/22  Yes Burchette, Elberta Fortis, MD  Lactase (LACTAID PO) Take 1 tablet by mouth 3 (three) times daily with meals as needed (lactose intolerance).   Yes [provider]  LORazepam (ATIVAN) 1 MG tablet TAKE 1/2 TO 1 TABLET(0.5 TO 1 MG) BY MOUTH AT BEDTIME AS NEEDED 03/24/23  Yes Burchette, Elberta Fortis, MD  omeprazole (PRILOSEC OTC) 20 MG tablet Take 1 tablet (20 mg total) by mouth daily. 09/17/13  Yes [name], MD  Propylene Glycol (SYSTANE BALANCE OP) Place 1 drop into both eyes daily as needed (for dry eyes).   Yes [provider]  tamsulosin (FLOMAX) 0.4 MG CAPS capsule TAKE 1 CAPSULE(0.4 MG) BY MOUTH DAILY 11/01/22  Yes Burchette, Elberta Fortis, MD  traMADol (ULTRAM) 50 MG tablet Take 1 tablet (50 mg total) by mouth every 6 (six) hours as needed. Patient taking differently: Take 50 mg by mouth every 6 (six) hours as needed for moderate pain (pain score 4-6) (and headache). 04/13/23  Yes Burchette, Elberta Fortis, MD  Polyethyl Glycol-Propyl Glycol (SYSTANE OP) Place 1 drop into both eyes 4 (four) times daily as needed (dry eye).    [provider]      Allergies    Tylenol [acetaminophen], Lactose intolerance (gi), Levaquin [levofloxacin], Advil [ibuprofen], and Asa [aspirin]    Review of Systems   Review of Systems  Physical Exam Updated Vital  Signs BP (!) 92/52   Pulse 98   Temp 98.4 F (36.9 C) (Oral)   Resp 18   SpO2 94%  Physical Exam Vitals and nursing note reviewed.  Constitutional:      Appearance: He is well-developed.  HENT:     Head: Normocephalic and atraumatic.  Cardiovascular:     Rate and Rhythm: Tachycardia present.  Pulmonary:     Effort: Pulmonary effort is normal. No respiratory distress.  Abdominal:     General: There is no distension.  Musculoskeletal:        General: Normal range of motion.     Cervical back: Normal range of motion.  Skin:    General: Skin is warm and dry.     Coloration: Skin is not jaundiced or pale.   Neurological:     Mental Status: He is alert.     ED Results / Procedures / Treatments   Labs (all labs ordered are listed, but only abnormal results are displayed) Labs Reviewed  COMPREHENSIVE METABOLIC PANEL WITH GFR - Abnormal; Notable for the following components:      Result Value   Glucose, Bld 130 (*)    BUN 29 (*)    Creatinine, Ser 1.46 (*)    Calcium 8.6 (*)    Total Protein 5.6 (*)    Albumin 2.8 (*)    GFR, Estimated 45 (*)    All other components within normal limits  CBC - Abnormal; Notable for the following components:   WBC 12.1 (*)    RBC 2.99 (*)    Hemoglobin 9.3 (*)    HCT 28.9 (*)    All other components within normal limits  PROTIME-INR - Abnormal; Notable for the following components:   Prothrombin Time 19.0 (*)    INR 1.6 (*)    All other components within normal limits  ETHANOL  URINALYSIS, ROUTINE W REFLEX MICROSCOPIC  I-STAT CHEM 8, ED  I-STAT CG4 LACTIC ACID, ED  SAMPLE TO BLOOD BANK    EKG None  Radiology CT Head Wo Contrast Result Date: 05/06/2023 CLINICAL DATA:  Head trauma, minor (Age >= 65y); Neck trauma (Age >= 65y) EXAM: CT HEAD WITHOUT CONTRAST CT CERVICAL SPINE WITHOUT CONTRAST TECHNIQUE: Multidetector CT imaging of the head and cervical spine was performed following the standard protocol without intravenous contrast. Multiplanar CT image reconstructions of the cervical spine were also generated. RADIATION DOSE REDUCTION: This exam was performed according to the departmental dose-optimization program which includes automated exposure control, adjustment of the mA and/or kV according to patient size and/or use of iterative reconstruction technique. COMPARISON:  CT head December 26, 2018. FINDINGS: CT HEAD FINDINGS Brain: Similar remote right frontal and parietal infarcts. No evidence of acute large vascular territory infarct, acute hemorrhage, mass lesion, midline shift or hydrocephalus. Cerebral atrophy. Vascular: No hyperdense vessel.  Skull: No acute fracture. Sinuses/Orbits: Paranasal sinus mucosal thickening with complete opacification of the right maxillary sinus, ethmoid air cells and right frontal sinus. CT CERVICAL SPINE FINDINGS Alignment: No substantial sagittal subluxation. Skull base and vertebrae: Vertebral body heights are maintained. No evidence of acute fracture. Soft tissues and spinal canal: No prevertebral fluid or swelling. No visible canal hematoma. Disc levels:  Mild for age bony degenerative change. Upper chest: Visualized lung apices are clear. IMPRESSION: 1. No evidence of acute abnormality intracranially or in the cervical spine. 2. Severe paranasal sinus disease. Electronically Signed   By: Feliberto Harts M.D.   On: 05/06/2023 03:21   CT Cervical Spine  Wo Contrast Result Date: 05/06/2023 CLINICAL DATA:  Head trauma, minor (Age >= 65y); Neck trauma (Age >= 65y) EXAM: CT HEAD WITHOUT CONTRAST CT CERVICAL SPINE WITHOUT CONTRAST TECHNIQUE: Multidetector CT imaging of the head and cervical spine was performed following the standard protocol without intravenous contrast. Multiplanar CT image reconstructions of the cervical spine were also generated. RADIATION DOSE REDUCTION: This exam was performed according to the departmental dose-optimization program which includes automated exposure control, adjustment of the mA and/or kV according to patient size and/or use of iterative reconstruction technique. COMPARISON:  CT head December 26, 2018. FINDINGS: CT HEAD FINDINGS Brain: Similar remote right frontal and parietal infarcts. No evidence of acute large vascular territory infarct, acute hemorrhage, mass lesion, midline shift or hydrocephalus. Cerebral atrophy. Vascular: No hyperdense vessel. Skull: No acute fracture. Sinuses/Orbits: Paranasal sinus mucosal thickening with complete opacification of the right maxillary sinus, ethmoid air cells and right frontal sinus. CT CERVICAL SPINE FINDINGS Alignment: No substantial  sagittal subluxation. Skull base and vertebrae: Vertebral body heights are maintained. No evidence of acute fracture. Soft tissues and spinal canal: No prevertebral fluid or swelling. No visible canal hematoma. Disc levels:  Mild for age bony degenerative change. Upper chest: Visualized lung apices are clear. IMPRESSION: 1. No evidence of acute abnormality intracranially or in the cervical spine. 2. Severe paranasal sinus disease. Electronically Signed   By: Feliberto Harts M.D.   On: 05/06/2023 03:21   DG Chest Port 1 View Result Date: 05/06/2023 CLINICAL DATA:  Recent fall with chest pain, initial encounter EXAM: PORTABLE CHEST 1 VIEW COMPARISON:  04/13/2022 FINDINGS: Cardiac shadow is within normal limits. Aortic calcifications are again noted. Lungs are well aerated bilaterally. No focal infiltrate or effusion is seen. No bony abnormality is noted. IMPRESSION: No acute abnormality noted. Electronically Signed   By: Alcide Clever M.D.   On: 05/06/2023 02:48    Procedures Procedures    Medications Ordered in ED Medications  lactated ringers bolus 1,000 mL (0 mLs Intravenous Stopped 05/06/23 0550)  lactated ringers bolus 1,000 mL (1,000 mLs Intravenous New Bag/Given 05/06/23 4696)    ED Course/ Medical Decision Making/ A&P                                 Medical Decision Making Amount and/or Complexity of Data Reviewed Labs: ordered. Radiology: ordered.  Risk Decision regarding hospitalization.   After arrival patient was upgraded to a level 2 trauma secondary to head injury on Eliquis.  CT scan did not show any head bleed.  Cervical spine was negative.  Chest x-ray was fine.  Patient was in soft pressures and tachycardia initially which improved with fluids.  Does have a history of diastolic heart failure so was not super aggressive however did receive almost 2 L and still was not able to walk and got significant lightheaded when he stood up.  He does have a new AKI.  I suspect that  this is likely dehydration on top of his known orthostatic hypotension, however with a history of heart failure multiple issues syncope and still we can either get up with pretty profound drop in his blood pressure when standing will discuss with hospitalist for admission.  Final Clinical Impression(s) / ED Diagnoses Final diagnoses:  Syncope, unspecified syncope type  Minor head injury, initial encounter  AKI (acute kidney injury) (HCC)    Rx / DC Orders ED Discharge Orders     None  Marily Memos, MD 05/06/23 [phone]

## 2023-05-06 NOTE — Assessment & Plan Note (Signed)
 Patient with history of orthostatic hypotension in the past.  His symptoms seem to be orthostatic.   Plan to continue check orthostatics.  Patient had 1 L IV fluids in the ED.  PT and Ot evaluations  Continue telemetry monitoring.

## 2023-05-06 NOTE — ED Triage Notes (Signed)
 Patient arrives via [facility] from home for multiple falls (4 in 48 hours.) Patient reports falls were mechanical but wife reports they involved LOC. On eliquis. Hematoma to head - patient states he hit is head on the bathtub while trying to get up from a fall. EMS activated code sepsis due to vitals and increase in urinary frequency.   HR 100 RR 25 BP 102/70

## 2023-05-06 NOTE — Assessment & Plan Note (Signed)
 No signs of acute decompensation, will continue rhythm control atrial fibrillation.  Check echocardiogram.  Apparently limited therapy due to hypotension.

## 2023-05-06 NOTE — Progress Notes (Signed)
 PT Cancellation Note  Patient Details Name: [name] HANDS, MD MRN: [id number] DOB: [birth year]   Cancelled Treatment:    Reason Eval/Treat Not Completed: Patient at procedure or test/unavailable (Pt off floor in Echo Lab. Will follow-up to complete PT evaluation tomorrow.)  Cheri Guppy, PT, DPT Acute Rehabilitation Services Office: [phone] Secure Chat Preferred  [name] 05/06/2023, 3:51 PM

## 2023-05-06 NOTE — Progress Notes (Signed)
  Echocardiogram 2D Echocardiogram has been performed.  [name] [name] 05/06/2023, 3:59 PM

## 2023-05-06 NOTE — ED Notes (Signed)
Pt to ct with this RN 

## 2023-05-06 NOTE — ED Notes (Signed)
 X-ray at bedside

## 2023-05-06 NOTE — Assessment & Plan Note (Signed)
 Continue PPI ?

## 2023-05-07 DIAGNOSIS — I350 Nonrheumatic aortic (valve) stenosis: Secondary | ICD-10-CM | POA: Diagnosis not present

## 2023-05-07 DIAGNOSIS — Z881 Allergy status to other antibiotic agents status: Secondary | ICD-10-CM | POA: Diagnosis not present

## 2023-05-07 DIAGNOSIS — Z955 Presence of coronary angioplasty implant and graft: Secondary | ICD-10-CM | POA: Diagnosis not present

## 2023-05-07 DIAGNOSIS — E739 Lactose intolerance, unspecified: Secondary | ICD-10-CM | POA: Diagnosis not present

## 2023-05-07 DIAGNOSIS — J45909 Unspecified asthma, uncomplicated: Secondary | ICD-10-CM | POA: Diagnosis not present

## 2023-05-07 DIAGNOSIS — I252 Old myocardial infarction: Secondary | ICD-10-CM | POA: Diagnosis not present

## 2023-05-07 DIAGNOSIS — K219 Gastro-esophageal reflux disease without esophagitis: Secondary | ICD-10-CM | POA: Diagnosis not present

## 2023-05-07 DIAGNOSIS — N179 Acute kidney failure, unspecified: Secondary | ICD-10-CM | POA: Diagnosis not present

## 2023-05-07 DIAGNOSIS — I272 Pulmonary hypertension, unspecified: Secondary | ICD-10-CM | POA: Diagnosis not present

## 2023-05-07 DIAGNOSIS — I5032 Chronic diastolic (congestive) heart failure: Secondary | ICD-10-CM | POA: Diagnosis not present

## 2023-05-07 DIAGNOSIS — E785 Hyperlipidemia, unspecified: Secondary | ICD-10-CM | POA: Diagnosis not present

## 2023-05-07 DIAGNOSIS — I951 Orthostatic hypotension: Secondary | ICD-10-CM

## 2023-05-07 DIAGNOSIS — R55 Syncope and collapse: Secondary | ICD-10-CM | POA: Diagnosis present

## 2023-05-07 DIAGNOSIS — I130 Hypertensive heart and chronic kidney disease with heart failure and stage 1 through stage 4 chronic kidney disease, or unspecified chronic kidney disease: Secondary | ICD-10-CM | POA: Diagnosis not present

## 2023-05-07 DIAGNOSIS — I2510 Atherosclerotic heart disease of native coronary artery without angina pectoris: Secondary | ICD-10-CM | POA: Diagnosis not present

## 2023-05-07 DIAGNOSIS — Z886 Allergy status to analgesic agent status: Secondary | ICD-10-CM | POA: Diagnosis not present

## 2023-05-07 DIAGNOSIS — Z79899 Other long term (current) drug therapy: Secondary | ICD-10-CM | POA: Diagnosis not present

## 2023-05-07 DIAGNOSIS — Z8249 Family history of ischemic heart disease and other diseases of the circulatory system: Secondary | ICD-10-CM | POA: Diagnosis not present

## 2023-05-07 DIAGNOSIS — I480 Paroxysmal atrial fibrillation: Secondary | ICD-10-CM | POA: Diagnosis not present

## 2023-05-07 DIAGNOSIS — N1831 Chronic kidney disease, stage 3a: Secondary | ICD-10-CM | POA: Diagnosis not present

## 2023-05-07 DIAGNOSIS — Z96653 Presence of artificial knee joint, bilateral: Secondary | ICD-10-CM | POA: Diagnosis not present

## 2023-05-07 DIAGNOSIS — F32A Depression, unspecified: Secondary | ICD-10-CM | POA: Diagnosis not present

## 2023-05-07 DIAGNOSIS — Z7901 Long term (current) use of anticoagulants: Secondary | ICD-10-CM | POA: Diagnosis not present

## 2023-05-07 DIAGNOSIS — D649 Anemia, unspecified: Secondary | ICD-10-CM | POA: Diagnosis not present

## 2023-05-07 DIAGNOSIS — Z825 Family history of asthma and other chronic lower respiratory diseases: Secondary | ICD-10-CM | POA: Diagnosis not present

## 2023-05-07 LAB — URINALYSIS, ROUTINE W REFLEX MICROSCOPIC
Bilirubin Urine: NEGATIVE
Glucose, UA: NEGATIVE mg/dL
Hgb urine dipstick: NEGATIVE
Ketones, ur: NEGATIVE mg/dL
Leukocytes,Ua: NEGATIVE
Nitrite: NEGATIVE
Protein, ur: NEGATIVE mg/dL
Specific Gravity, Urine: 1.02 (ref 1.005–1.030)
pH: 5 (ref 5.0–8.0)

## 2023-05-07 LAB — CBC
HCT: 26.7 % — ABNORMAL LOW (ref 39.0–52.0)
Hemoglobin: 8.5 g/dL — ABNORMAL LOW (ref 13.0–17.0)
MCH: 30.4 pg (ref 26.0–34.0)
MCHC: 31.8 g/dL (ref 30.0–36.0)
MCV: 95.4 fL (ref 80.0–100.0)
Platelets: 191 10*3/uL (ref 150–400)
RBC: 2.8 MIL/uL — ABNORMAL LOW (ref 4.22–5.81)
RDW: 13.6 % (ref 11.5–15.5)
WBC: 9.9 10*3/uL (ref 4.0–10.5)
nRBC: 0 % (ref 0.0–0.2)

## 2023-05-07 LAB — BASIC METABOLIC PANEL WITH GFR
Anion gap: 6 (ref 5–15)
BUN: 21 mg/dL (ref 8–23)
CO2: 25 mmol/L (ref 22–32)
Calcium: 8.5 mg/dL — ABNORMAL LOW (ref 8.9–10.3)
Chloride: 105 mmol/L (ref 98–111)
Creatinine, Ser: 1.15 mg/dL (ref 0.61–1.24)
GFR, Estimated: >60 mL/min (ref >60)
Glucose, Bld: 107 mg/dL — ABNORMAL HIGH (ref 70–99)
Potassium: 3.9 mmol/L (ref 3.5–5.1)
Sodium: 136 mmol/L (ref 135–145)

## 2023-05-07 MED ORDER — MIDODRINE HCL 5 MG PO TABS
10.0000 mg | ORAL_TABLET | Freq: Three times a day (TID) | ORAL | Status: DC
  Administered 2023-05-07 – 2023-05-09 (×4): 10 mg via ORAL
  Filled 2023-05-07 (×4): qty 2

## 2023-05-07 MED ORDER — MIDODRINE HCL 5 MG PO TABS
5.0000 mg | ORAL_TABLET | Freq: Three times a day (TID) | ORAL | Status: DC
  Administered 2023-05-07: 5 mg via ORAL
  Filled 2023-05-07 (×2): qty 1

## 2023-05-07 MED ORDER — LOPERAMIDE HCL 2 MG PO CAPS
4.0000 mg | ORAL_CAPSULE | Freq: Three times a day (TID) | ORAL | Status: DC | PRN
  Administered 2023-05-07: 4 mg via ORAL
  Filled 2023-05-07: qty 2

## 2023-05-07 NOTE — Plan of Care (Signed)

## 2023-05-07 NOTE — Evaluation (Addendum)
 Occupational Therapy Evaluation Patient Details Name: [name] LASHLEY, MD MRN: [id number] DOB: 1929-10-14 Today's Date: 05/07/2023   History of Present Illness   Pt is a 89 y.o. male presenting s/p fall in the bathroom where he hit the back of his head on the bathtub. Admitted with syncope. Head and neck imaging were unremarkable. PMH HTN, pulmonary HTN, A-fib, hx of CVA, HF, CKD 3a, CAD, GERD, depression, HLD     Clinical Impressions PTA, pt lives with spouse, typically ambulatory without AD but pt reports family encourages DME use. Pt reports Modified Independence with ADLs and shares some IADLs with spouse. Pt presents now with minor deficits in dynamic standing balance and endurance though primarily limited by asymptomatic orthostatic hypotension. Pt able to demonstrate BSC transfers CGA-Min A and LB ADLs with Min A. Pt shows insight into deficits and does endorse moving too quickly at times at home given hx of BP issues. OT reaching out to MD for consideration of compression stockings vs abdominal binder to improve BP with activity. Anticipate pt will be able to DC home once orthostatic hypotension improves. Will continue to follow acutely.  BP after first transfer to [facility]: 70/50 (58) BP after transfer back to bed: 82/43 (57)     If plan is discharge home, recommend the following:   A little help with walking and/or transfers;A little help with bathing/dressing/bathroom;Assistance with cooking/housework;Help with stairs or ramp for entrance     Functional Status Assessment   Patient has had a recent decline in their functional status and demonstrates the ability to make significant improvements in function in a reasonable and predictable amount of time.     Equipment Recommendations   None recommended by OT     Recommendations for Other Services         Precautions/Restrictions   Precautions Precautions: Fall;Other (comment) Recall of Precautions/Restrictions:  Intact Precaution/Restrictions Comments: watch orthostatics Restrictions Weight Bearing Restrictions Per Provider Order: No     Mobility Bed Mobility Overal bed mobility: Needs Assistance Bed Mobility: Supine to Sit, Sit to Supine     Supine to sit: Supervision Sit to supine: Supervision        Transfers Overall transfer level: Needs assistance Equipment used: Rolling walker (2 wheels), None Transfers: Sit to/from Stand, Bed to chair/wheelchair/BSC Sit to Stand: Contact guard assist     Step pivot transfers: Contact guard assist, Min assist     General transfer comment: MIn A without AD to BSC, CGA for back to bed with RW      Balance Overall balance assessment: Needs assistance Sitting-balance support: No upper extremity supported, Feet supported Sitting balance-Leahy Scale: Good     Standing balance support: Bilateral upper extremity supported, During functional activity Standing balance-Leahy Scale: Poor                             ADL either performed or assessed with clinical judgement   ADL Overall ADL's : Needs assistance/impaired Eating/Feeding: Independent   Grooming: Contact guard assist;Standing   Upper Body Bathing: Set up;Sitting   Lower Body Bathing: Minimal assistance;Sitting/lateral leans;Sit to/from stand   Upper Body Dressing : Set up;Sitting   Lower Body Dressing: Minimal assistance;Sitting/lateral leans;Sit to/from stand   Toilet Transfer: Minimal assistance;Contact guard assist;Stand-pivot;BSC/3in1;Rolling walker (2 wheels) Toilet Transfer Details (indicate cue type and reason): Min A to transfer OOB to [facility] without AD, progressing to CGA for return to bed with RW Toileting- Clothing Manipulation and Hygiene:  Minimal assistance;Sitting/lateral lean;Sit to/from stand Toileting - Clothing Manipulation Details (indicate cue type and reason): assist for hygiene in standing while pt holding to RW       General ADL Comments:  Limited OOB progression due to hypotension though pt asymptomatic     Vision Baseline Vision/History: 1 Wears glasses Ability to See in Adequate Light: 0 Adequate Patient Visual Report: No change from baseline Vision Assessment?: No apparent visual deficits     Perception         Praxis         Pertinent Vitals/Pain Pain Assessment Pain Assessment: No/denies pain     Extremity/Trunk Assessment Upper Extremity Assessment Upper Extremity Assessment: Generalized weakness;Right hand dominant   Lower Extremity Assessment Lower Extremity Assessment: Defer to PT evaluation   Cervical / Trunk Assessment Cervical / Trunk Assessment: Normal   Communication Communication Communication: Impaired Factors Affecting Communication: Hearing impaired;Other (comment) (has hearing aides)   Cognition Arousal: Alert Behavior During Therapy: WFL for tasks assessed/performed Cognition: No apparent impairments                               Following commands: Intact       Cueing  General Comments   Cueing Techniques: Verbal cues;Gestural cues  Difficulty obtaining BP readings and pt with bathroom urgency on entry. BP on L UE after transfer to [facility] 70/50 (58). Delayed reading after transfer back to bed on R UE 82/43 (57)   Exercises     Shoulder Instructions      Home Living Family/patient expects to be discharged to:: Private residence Living Arrangements: Spouse/significant other Available Help at Discharge: Family;Available 24 hours/day Type of Home: Other(Comment) (townhouse) Home Access: Stairs to enter [employer] of Steps: 3   Home Layout: Multi-level;Able to live on main level with bedroom/bathroom     Bathroom Shower/Tub: Arts development officer Toilet: Handicapped height     Home Equipment: Agricultural consultant (2 wheels);Shower seat;Grab bars - tub/shower          Prior Functioning/Environment Prior Level of Function :  Independent/Modified Independent;History of Falls (last six months)             Mobility Comments: typically no AD for mobility but pt reports his family seems happier when he uses RW. recent falls related to dizziness ADLs Comments: Mod I for ADLs, wife mostly manages IADLs but pt assists as able    OT Problem List: Decreased knowledge of precautions;Impaired balance (sitting and/or standing);Decreased activity tolerance   OT Treatment/Interventions: Self-care/ADL training;Therapeutic exercise;Energy conservation;DME and/or AE instruction;Therapeutic activities;Patient/family education;Balance training      OT Goals(Current goals can be found in the care plan section)   Acute Rehab OT Goals Patient Stated Goal: go home OT Goal Formulation: With patient Time For Goal Achievement: 05/21/23 Potential to Achieve Goals: Good ADL Goals Pt Will Perform Lower Body Bathing: with modified independence;sit to/from stand Pt Will Transfer to Toilet: with modified independence;ambulating Additional ADL Goal #1: Pt to verbalize at least 3 fall prevention strategies to implement at home in relation to orthostatic hypotension Additional ADL Goal #2: Pt to increase standing activity tolerance > 10 min during ADLs/mobility without seated rest break or significant BP drop   OT Frequency:  Min 2X/week    Co-evaluation              AM-PAC OT "6 Clicks" Daily Activity     Outcome Measure Help from another  person eating meals?: None Help from another person taking care of personal grooming?: A Little Help from another person toileting, which includes using toliet, bedpan, or urinal?: A Little Help from another person bathing (including washing, rinsing, drying)?: A Little Help from another person to put on and taking off regular upper body clothing?: A Little Help from another person to put on and taking off regular lower body clothing?: A Little 6 Click Score: 19   End of Session  Equipment Utilized During Treatment: Rolling walker (2 wheels) Nurse Communication: Mobility status;Other (comment) (BP, asked about compression stockings vs abdominal binder, NT aware of pt loose stools)  Activity Tolerance: Patient tolerated treatment well Patient left: in bed;with call bell/phone within reach;with bed alarm set;with nursing/sitter in room  OT Visit Diagnosis: Unsteadiness on feet (R26.81)                Time: 1610-9604 OT Time Calculation (min): 28 min Charges:  OT General Charges $OT Visit: 1 Visit OT Evaluation $OT Eval Moderate Complexity: 1 Mod OT Treatments $Self Care/Home Management : 8-22 mins  Bradd Canary, OTR/L Acute Rehab Services Office: [phone]   Lorre Munroe 05/07/2023, 8:13 AM

## 2023-05-07 NOTE — Progress Notes (Signed)
 [facility] PROGRESS NOTE   Marijean Heath, MD WJX:[id number] DOB: [birth year] DOA: 05/06/2023  PCP: Kristian Covey, MD  Brief History:  89 y.o. male with medical history significant of asthma, atrial fibrillation, heart failure, coronary artery disease, GERD, hypertension and hyperlipidemia who presented with near syncope.  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 For 4 days he has noted dizziness and lightheadedness while standing rapidly, can be severe at times experiencing near syncope, but per his recollection he has had no loss of consciousness. Symptoms improved with seating.  Old records personally reviewed last cardiology visit 03/11/23, noted pulmonary hypertension, with severe tricuspid regurgitation. Taking as needed furosemide.  Symptomatic paroxysmal atrial fibrillation triggered by physical or emotional stress. In the past atenolol and diltiazem were discontinued due to low BP and orthostatic symptoms.   Consultants: None  Procedures: Echocardiogram    Subjective/Interval History: Feels better this morning.  Had significant drop in blood pressure when he worked with therapy this morning.  Denies any chest pain or shortness of breath.  Did have diarrheal illness few days ago and had poor oral intake as well.     Assessment/Plan:  Orthostatic hypotension with near syncope Longstanding history of orthostatic hypotension.  He is not on any blood pressure lowering agents.  Noted to be on Flomax which he takes at nighttime. He might have become hypovolemic due to recent diarrheal illness.  Was given IV fluids with improvement in his creatinine. Will use TED stockings.  Will give him midodrine as well.  Check orthostatics twice a day for now.  Normocytic anemia Has longstanding history of anemia.  Drop in hemoglobin noted to 8.5 this morning which is likely dilutional.  Has not noticed any overt bleeding.  Recheck labs  tomorrow.  Paroxysmal atrial fibrillation Noted to be on Tikosyn.  Anticoagulated with apixaban. Followed by Dr. Shirlee Latch with cardiology.  Chronic kidney disease stage IIIa Stable.  Coronary artery disease Stable.  Pulmonary hypertension/chronic diastolic CHF Echocardiogram showed LVEF of 60 to 65% with grade 3 diastolic dysfunction.  No evidence for volume overload currently.  If at all he was hypovolemic admission.  Continue to monitor off of diuretics.  History of stroke Stable.  Depression Continue with home medications.   DVT Prophylaxis: On apixaban Code Status: Full code Family Communication: Discussed with patient.  Will update his wife later today Disposition Plan: Home when improved  Status is: Observation The patient will require care spanning > 2 midnights and should be moved to inpatient because: Persistent orthostatic hypotension      Medications: Scheduled:  apixaban  5 mg Oral BID   atorvastatin  20 mg Oral Daily   budesonide  2 mL Inhalation BID   buPROPion  150 mg Oral Daily   dofetilide  250 mcg Oral BID   FLUoxetine  10 mg Oral Daily   lactase  3,000 Units Oral TID WC   loratadine  10 mg Oral Daily   midodrine  5 mg Oral TID WC   omeprazole  20 mg Oral Daily   tamsulosin  0.4 mg Oral QPC supper   Continuous: OZH:YQMVHQION, Polyethyl Glycol-Propyl Glycol  Antibiotics: Anti-infectives (From admission, onward)    None       Objective:  Vital Signs  Vitals:   05/06/23 2339 05/07/23 0331 05/07/23 0754 05/07/23 0802  BP: (!) 99/52 125/71  99/64  Pulse: 75 89  99  Resp: 16 17    Temp: 99.1 F (  37.3 C) 98.4 F (36.9 C)    TempSrc: Oral Oral    SpO2: 98% 97% 93% 99%  Weight:      Height:       No intake or output data in the 24 hours ending 05/07/23 0946 Filed Weights   05/06/23 1512  Weight: 66.8 kg    General appearance: Awake alert.  In no distress Resp: Clear to auscultation bilaterally.  Normal effort Cardio: S1-S2 is  normal regular.  No S3-S4.  No rubs murmurs or bruit GI: Abdomen is soft.  Nontender nondistended.  Bowel sounds are present normal.  No masses organomegaly Extremities: No edema.  Full range of motion of lower extremities. Neurologic: Alert and oriented x3.  No focal neurological deficits.  Skin exam reveals multiple bruising   Lab Results:  Data Reviewed: I have personally reviewed following labs and reports of the imaging studies  CBC: Recent Labs  Lab 05/06/23 0221 05/07/23 0319  WBC 12.1* 9.9  HGB 9.3* 8.5*  HCT 28.9* 26.7*  MCV 96.7 95.4  PLT 227 191    Basic Metabolic Panel: Recent Labs  Lab 05/06/23 0221 05/07/23 0319  NA 135 136  K 4.3 3.9  CL 103 105  CO2 24 25  GLUCOSE 130* 107*  BUN 29* 21  CREATININE 1.46* 1.15  CALCIUM 8.6* 8.5*    GFR: Estimated Creatinine Clearance: 39.5 mL/min (by C-G formula based on SCr of 1.15 mg/dL).  Liver Function Tests: Recent Labs  Lab 05/06/23 0221  AST 18  ALT 16  ALKPHOS 64  BILITOT 0.7  PROT 5.6*  ALBUMIN 2.8*    Coagulation Profile: Recent Labs  Lab 05/06/23 0221  INR 1.6*     Radiology Studies: ECHOCARDIOGRAM COMPLETE Result Date: 05/06/2023    ECHOCARDIOGRAM REPORT   Patient Name:   DR. Marijean Heath Date of Exam: 05/06/2023 Medical Rec #:  [id number]         Height:       70.0 in Accession #:    [id number]        Weight:       145.2 lb Date of Birth:  Jan 30, 1930         BSA:          1.822 m Patient Age:    89 years          BP:           121/60 mmHg Patient Gender: M                 HR:           83 bpm. Exam Location:  Inpatient Procedure: 2D Echo (Both Spectral and Color Flow Doppler were utilized during            procedure). Indications:    dyspnea  History:        Patient has prior history of Echocardiogram examinations, most                 recent 09/22/2017. CAD, chronic kidney disease and Pulmonary HTN,                 Signs/Symptoms:Syncope; Risk Factors:Hypertension and                  Dyslipidemia.  Sonographer:    Delcie Roch RDCS Referring Phys: [id number] MAURICIO DANIEL ARRIEN IMPRESSIONS  1. Left ventricular ejection fraction, by estimation, is 60 to 65%. The left ventricle has normal function. The left ventricle has no  regional wall motion abnormalities. Left ventricular diastolic parameters are consistent with Grade III diastolic dysfunction (restrictive). Elevated left atrial pressure. The E/e' is 18.  2. Right ventricular systolic function is normal. The right ventricular size is normal. There is severely elevated pulmonary artery systolic pressure. The estimated right ventricular systolic pressure is 61.7 mmHg.  3. Left atrial size was moderately dilated.  4. Right atrial size was mildly dilated.  5. The mitral valve is degenerative. Mild mitral valve regurgitation. No evidence of mitral stenosis.  6. Tricuspid valve regurgitation is severe.  7. The aortic valve is calcified. Aortic valve regurgitation is mild. Moderate aortic valve stenosis.  8. The inferior vena cava is normal in size with greater than 50% respiratory variability, suggesting right atrial pressure of 3 mmHg. FINDINGS  Left Ventricle: Left ventricular ejection fraction, by estimation, is 60 to 65%. The left ventricle has normal function. The left ventricle has no regional wall motion abnormalities. The left ventricular internal cavity size was normal in size. There is  no left ventricular hypertrophy. Left ventricular diastolic parameters are consistent with Grade III diastolic dysfunction (restrictive). Elevated left atrial pressure. The E/e' is 32. Right Ventricle: The right ventricular size is normal. No increase in right ventricular wall thickness. Right ventricular systolic function is normal. There is severely elevated pulmonary artery systolic pressure. The tricuspid regurgitant velocity is 3.83 m/s, and with an assumed right atrial pressure of 3 mmHg, the estimated right ventricular systolic pressure is 61.7  mmHg. Left Atrium: Left atrial size was moderately dilated. Right Atrium: Right atrial size was mildly dilated. Pericardium: There is no evidence of pericardial effusion. Mitral Valve: The mitral valve is degenerative in appearance. Mild mitral valve regurgitation. No evidence of mitral valve stenosis. Tricuspid Valve: The tricuspid valve is grossly normal. Tricuspid valve regurgitation is severe. No evidence of tricuspid stenosis. Aortic Valve: The aortic valve is calcified. Aortic valve regurgitation is mild. Aortic regurgitation PHT measures 349 msec. Moderate aortic stenosis is present. Aortic valve mean gradient measures 15.0 mmHg. Aortic valve peak gradient measures 27.0 mmHg. Aortic valve area, by VTI measures 1.21 cm. Pulmonic Valve: The pulmonic valve was not well visualized. Pulmonic valve regurgitation is trivial. No evidence of pulmonic stenosis. Aorta: The aortic root and ascending aorta are structurally normal, with no evidence of dilitation. Venous: The inferior vena cava is normal in size with greater than 50% respiratory variability, suggesting right atrial pressure of 3 mmHg. IAS/Shunts: The interatrial septum was not well visualized.  LEFT VENTRICLE PLAX 2D LVIDd:         4.00 cm   Diastology LVIDs:         2.50 cm   LV e' medial:    7.07 cm/s LV PW:         1.00 cm   LV E/e' medial:  19.8 LV IVS:        1.00 cm   LV e' lateral:   8.70 cm/s LVOT diam:     2.10 cm   LV E/e' lateral: 16.1 LV SV:         63 LV SV Index:   35 LVOT Area:     3.46 cm  RIGHT VENTRICLE             IVC RV Basal diam:  3.40 cm     IVC diam: 1.80 cm RV S prime:     19.10 cm/s TAPSE (M-mode): 2.0 cm LEFT ATRIUM           Index  RIGHT ATRIUM           Index LA diam:      4.50 cm 2.47 cm/m   RA Area:     20.50 cm LA Vol (A4C): 87.7 ml 48.14 ml/m  RA Volume:   59.70 ml  32.77 ml/m  AORTIC VALVE AV Area (Vmax):    1.31 cm AV Area (Vmean):   1.23 cm AV Area (VTI):     1.21 cm AV Vmax:           260.00 cm/s AV Vmean:           180.000 cm/s AV VTI:            0.522 m AV Peak Grad:      27.0 mmHg AV Mean Grad:      15.0 mmHg LVOT Vmax:         98.25 cm/s LVOT Vmean:        63.800 cm/s LVOT VTI:          0.183 m LVOT/AV VTI ratio: 0.35 AI PHT:            349 msec  AORTA Ao Root diam: 3.20 cm Ao Asc diam:  3.60 cm MITRAL VALVE                TRICUSPID VALVE MV Area (PHT): 3.27 cm     TR Peak grad:   58.7 mmHg MV Decel Time: 232 msec     TR Vmax:        383.00 cm/s MR Peak grad: 90.6 mmHg MR Mean grad: 65.0 mmHg     SHUNTS MR Vmax:      476.00 cm/s   Systemic VTI:  0.18 m MR Vmean:     393.0 cm/s    Systemic Diam: 2.10 cm MV E velocity: 140.00 cm/s MV A velocity: 71.70 cm/s MV E/A ratio:  1.95 Sunit Tolia Electronically signed by Tessa Lerner Signature Date/Time: 05/06/2023/7:36:54 PM    Final    CT Head Wo Contrast Result Date: 05/06/2023 CLINICAL DATA:  Head trauma, minor (Age >= 65y); Neck trauma (Age >= 65y) EXAM: CT HEAD WITHOUT CONTRAST CT CERVICAL SPINE WITHOUT CONTRAST TECHNIQUE: Multidetector CT imaging of the head and cervical spine was performed following the standard protocol without intravenous contrast. Multiplanar CT image reconstructions of the cervical spine were also generated. RADIATION DOSE REDUCTION: This exam was performed according to the departmental dose-optimization program which includes automated exposure control, adjustment of the mA and/or kV according to patient size and/or use of iterative reconstruction technique. COMPARISON:  CT head December 26, 2018. FINDINGS: CT HEAD FINDINGS Brain: Similar remote right frontal and parietal infarcts. No evidence of acute large vascular territory infarct, acute hemorrhage, mass lesion, midline shift or hydrocephalus. Cerebral atrophy. Vascular: No hyperdense vessel. Skull: No acute fracture. Sinuses/Orbits: Paranasal sinus mucosal thickening with complete opacification of the right maxillary sinus, ethmoid air cells and right frontal sinus. CT CERVICAL SPINE  FINDINGS Alignment: No substantial sagittal subluxation. Skull base and vertebrae: Vertebral body heights are maintained. No evidence of acute fracture. Soft tissues and spinal canal: No prevertebral fluid or swelling. No visible canal hematoma. Disc levels:  Mild for age bony degenerative change. Upper chest: Visualized lung apices are clear. IMPRESSION: 1. No evidence of acute abnormality intracranially or in the cervical spine. 2. Severe paranasal sinus disease. Electronically Signed   By: Feliberto Harts M.D.   On: 05/06/2023 03:21   CT Cervical Spine Wo Contrast Result Date: 05/06/2023 CLINICAL  DATA:  Head trauma, minor (Age >= 65y); Neck trauma (Age >= 65y) EXAM: CT HEAD WITHOUT CONTRAST CT CERVICAL SPINE WITHOUT CONTRAST TECHNIQUE: Multidetector CT imaging of the head and cervical spine was performed following the standard protocol without intravenous contrast. Multiplanar CT image reconstructions of the cervical spine were also generated. RADIATION DOSE REDUCTION: This exam was performed according to the departmental dose-optimization program which includes automated exposure control, adjustment of the mA and/or kV according to patient size and/or use of iterative reconstruction technique. COMPARISON:  CT head December 26, 2018. FINDINGS: CT HEAD FINDINGS Brain: Similar remote right frontal and parietal infarcts. No evidence of acute large vascular territory infarct, acute hemorrhage, mass lesion, midline shift or hydrocephalus. Cerebral atrophy. Vascular: No hyperdense vessel. Skull: No acute fracture. Sinuses/Orbits: Paranasal sinus mucosal thickening with complete opacification of the right maxillary sinus, ethmoid air cells and right frontal sinus. CT CERVICAL SPINE FINDINGS Alignment: No substantial sagittal subluxation. Skull base and vertebrae: Vertebral body heights are maintained. No evidence of acute fracture. Soft tissues and spinal canal: No prevertebral fluid or swelling. No visible canal  hematoma. Disc levels:  Mild for age bony degenerative change. Upper chest: Visualized lung apices are clear. IMPRESSION: 1. No evidence of acute abnormality intracranially or in the cervical spine. 2. Severe paranasal sinus disease. Electronically Signed   By: Feliberto Harts M.D.   On: 05/06/2023 03:21   DG Chest Port 1 View Result Date: 05/06/2023 CLINICAL DATA:  Recent fall with chest pain, initial encounter EXAM: PORTABLE CHEST 1 VIEW COMPARISON:  04/13/2022 FINDINGS: Cardiac shadow is within normal limits. Aortic calcifications are again noted. Lungs are well aerated bilaterally. No focal infiltrate or effusion is seen. No bony abnormality is noted. IMPRESSION: No acute abnormality noted. Electronically Signed   By: Alcide Clever M.D.   On: 05/06/2023 02:48       LOS: 0 days   Merlinda Wrubel Rito Ehrlich  [facility] Pager on [email or web address]  05/07/2023, 9:46 AM

## 2023-05-07 NOTE — Care Management Obs Status (Signed)
 MEDICARE OBSERVATION STATUS NOTIFICATION   Patient Details  Name: [name] HENGST, [name] [name] MRN: [id number] Date of Birth: 1930-01-08   Medicare Observation Status Notification Given:  Yes    Ronny Bacon, RN 05/07/2023, 11:47 AM

## 2023-05-07 NOTE — Evaluation (Signed)
 Physical Therapy Evaluation Patient Details Name: [name] GOODIE, MD MRN: [id number] DOB: 06-Jul-1929 Today's Date: 05/07/2023  History of Present Illness  Pt is a 89 y.o. male presenting 05/06/23 s/p fall in the bathroom where he hit the back of his head on the bathtub. Admitted with syncope. Head and neck imaging were unremarkable. PMH HTN, pulmonary HTN, A-fib, hx of CVA, HF, CKD 3a, CAD, GERD, depression, HLD  Clinical Impression  Pt is a 89 y.o. male presenting on 05/06/23 with above conditions and deficits below, see PT Problem List. PTA pt lived in a multi-level home with his wife, had 3 STE, intermittently used a rollator for mobility, and received assistance from his wife for iADLs. Currently the pt is supervision for bed mobility and CGA-min A for transfers. Given the pt's drop in BP with positional changes, gait was unable to be assessed this session. Pt displays deficits in activity tolerance, static and dynamic balance, functional mobility, and gait and would benefit from continued acute PT. Additionally, given the pt's PLOF, available support system, and motivation to improve he would benefit from follow-up HHPT. Pending pt progress, alternative intense rehab options, > 3 hours, may need to be explored. As he improves, pt would benefit from continued mobility with nurses and mobility specialists until d/c. Will continue to follow acutely.    BP readings with donned TED hose (symptomatic with standing)  Supine: 125/68 (84) HR: 87 EOB: 131/106 (116) Standing: 66/35 (46) HR: 96 Standing s/p marching: 77/49 (61) In recliner: 85/49 (61) Recliner EOS: 125/62 (80) HR: 94          If plan is discharge home, recommend the following: A lot of help with walking and/or transfers;A little help with bathing/dressing/bathroom;Assistance with cooking/housework;Assist for transportation;Help with stairs or ramp for entrance   Can travel by private vehicle        Equipment Recommendations  BSC/3in1  Recommendations for Other Services       Functional Status Assessment Patient has had a recent decline in their functional status and demonstrates the ability to make significant improvements in function in a reasonable and predictable amount of time.     Precautions / Restrictions Precautions Precautions: Fall Recall of Precautions/Restrictions: Intact Precaution/Restrictions Comments: watch orthostatics Restrictions Weight Bearing Restrictions Per Provider Order: No      Mobility  Bed Mobility Overal bed mobility: Needs Assistance Bed Mobility: Supine to Sit     Supine to sit: Supervision     General bed mobility comments: pt supervision for supine to sit s/t HOB elevation and safety    Transfers Overall transfer level: Needs assistance Equipment used: Rollator (4 wheels) Transfers: Sit to/from Stand, Bed to chair/wheelchair/BSC Sit to Stand: Contact guard assist, Min assist   Step pivot transfers: Contact guard assist, Min assist       General transfer comment: pt performed x2 sit to stands. 1st sit to stand min A for rollator management and hand placement cueing. 2nd CGA for safety.    Ambulation/Gait             Pre-gait activities: pt able to march in place x8-10 per foot and  reports similar levels of dizziness as before General Gait Details: deferred s/t pt presentation  Stairs            Wheelchair Mobility     Tilt Bed    Modified Rankin (Stroke Patients Only)       Balance Overall balance assessment: Needs assistance Sitting-balance support: No upper extremity supported, Feet  supported Sitting balance-Leahy Scale: Good Sitting balance - Comments: pt sits EOB without LOB or directional lean   Standing balance support: Bilateral upper extremity supported, During functional activity Standing balance-Leahy Scale: Poor Standing balance comment: pt stands with increased BOS and trunk flexion. Reports dizziness upon standing  that decreases once he sits down                             Pertinent Vitals/Pain Pain Assessment Pain Assessment: No/denies pain    Home Living Family/patient expects to be discharged to:: Private residence Living Arrangements: Spouse/significant other Available Help at Discharge: Family;Available 24 hours/day Type of Home: Other(Comment) (townhouse) Home Access: Stairs to enter Entrance Stairs-Rails: Right Entrance Stairs-Number of Steps: 3   Home Layout: Multi-level;Able to live on main level with bedroom/bathroom Home Equipment: Shower seat;Grab bars - tub/shower;Rollator (4 wheels);Wheelchair - manual      Prior Function Prior Level of Function : Independent/Modified Independent;History of Falls (last six months)             Mobility Comments: typically no AD for mobility but pt reports his family seems happier when he uses rollator. recent falls related to dizziness ADLs Comments: Mod I for ADLs, wife mostly manages IADLs but pt assists as able     Extremity/Trunk Assessment   Upper Extremity Assessment Upper Extremity Assessment: Defer to OT evaluation    Lower Extremity Assessment Lower Extremity Assessment: Generalized weakness;RLE deficits/detail RLE Deficits / Details: pt's has a right foot bony abnormality    Cervical / Trunk Assessment Cervical / Trunk Assessment: Normal  Communication   Communication Communication: Impaired Factors Affecting Communication: Hearing impaired;Other (comment) (has hearing aides)    Cognition Arousal: Alert Behavior During Therapy: WFL for tasks assessed/performed   PT - Cognitive impairments: No apparent impairments                         Following commands: Intact       Cueing Cueing Techniques: Verbal cues, Gestural cues     General Comments General comments (skin integrity, edema, etc.): See PT comments for BP readings    Exercises     Assessment/Plan    PT Assessment Patient  needs continued PT services  PT Problem List Decreased strength;Decreased range of motion;Decreased activity tolerance;Decreased balance;Decreased mobility;Decreased coordination;Cardiopulmonary status limiting activity;Decreased skin integrity       PT Treatment Interventions DME instruction;Gait training;Stair training;Functional mobility training;Therapeutic activities;Therapeutic exercise;Balance training;Neuromuscular re-education;Patient/family education    PT Goals (Current goals can be found in the Care Plan section)  Acute Rehab PT Goals Patient Stated Goal: return home PT Goal Formulation: With patient/family Time For Goal Achievement: 05/21/23 Potential to Achieve Goals: Good    Frequency Min 2X/week     Co-evaluation               AM-PAC PT "6 Clicks" Mobility  Outcome Measure Help needed turning from your back to your side while in a flat bed without using bedrails?: A Little Help needed moving from lying on your back to sitting on the side of a flat bed without using bedrails?: A Little Help needed moving to and from a bed to a chair (including a wheelchair)?: A Little Help needed standing up from a chair using your arms (e.g., wheelchair or bedside chair)?: A Little Help needed to walk in hospital room?: Total Help needed climbing 3-5 steps with a railing? : Total 6 Click  Score: 14    End of Session Equipment Utilized During Treatment: Gait belt Activity Tolerance: Treatment limited secondary to medical complications (Comment) (limited by [facility]) Patient left: in chair;with call bell/phone within reach;with family/visitor present (nurse reported pt doesn't require an active chair alarm) Nurse Communication: Mobility status;Other (comment) (pt's room didn't have a chair alarm wall unit. BP readings during session) PT Visit Diagnosis: Unsteadiness on feet (R26.81);Other abnormalities of gait and mobility (R26.89);Muscle weakness (generalized) (M62.81);Difficulty in  walking, not elsewhere classified (R26.2);Dizziness and giddiness (R42);History of falling (Z91.81)    Time: 2952-8413 PT Time Calculation (min) (ACUTE ONLY): 25 min   Charges:   PT Evaluation $PT Eval Moderate Complexity: 1 Mod PT Treatments $Therapeutic Activity: 8-22 mins PT General Charges $$ ACUTE PT VISIT: 1 Visit        [address], SPT   [place] 05/07/2023, 4:31 PM

## 2023-05-08 DIAGNOSIS — I951 Orthostatic hypotension: Secondary | ICD-10-CM | POA: Diagnosis not present

## 2023-05-08 DIAGNOSIS — I5032 Chronic diastolic (congestive) heart failure: Secondary | ICD-10-CM | POA: Diagnosis not present

## 2023-05-08 DIAGNOSIS — I480 Paroxysmal atrial fibrillation: Secondary | ICD-10-CM | POA: Diagnosis not present

## 2023-05-08 LAB — BASIC METABOLIC PANEL WITH GFR
Anion gap: 5 (ref 5–15)
BUN: 20 mg/dL (ref 8–23)
CO2: 24 mmol/L (ref 22–32)
Calcium: 7.9 mg/dL — ABNORMAL LOW (ref 8.9–10.3)
Chloride: 105 mmol/L (ref 98–111)
Creatinine, Ser: 1.08 mg/dL (ref 0.61–1.24)
GFR, Estimated: >60 mL/min
Glucose, Bld: 99 mg/dL (ref 70–99)
Potassium: 3.7 mmol/L (ref 3.5–5.1)
Sodium: 134 mmol/L — ABNORMAL LOW (ref 135–145)

## 2023-05-08 LAB — CBC
HCT: 24.1 % — ABNORMAL LOW (ref 39.0–52.0)
Hemoglobin: 7.9 g/dL — ABNORMAL LOW (ref 13.0–17.0)
MCH: 30.5 pg (ref 26.0–34.0)
MCHC: 32.8 g/dL (ref 30.0–36.0)
MCV: 93.1 fL (ref 80.0–100.0)
Platelets: 164 10*3/uL (ref 150–400)
RBC: 2.59 MIL/uL — ABNORMAL LOW (ref 4.22–5.81)
RDW: 13.5 % (ref 11.5–15.5)
WBC: 7.9 10*3/uL (ref 4.0–10.5)
nRBC: 0 % (ref 0.0–0.2)

## 2023-05-08 LAB — HEMOGLOBIN AND HEMATOCRIT, BLOOD
HCT: 24.9 % — ABNORMAL LOW (ref 39.0–52.0)
Hemoglobin: 8.1 g/dL — ABNORMAL LOW (ref 13.0–17.0)

## 2023-05-08 LAB — MAGNESIUM: Magnesium: 1.6 mg/dL — ABNORMAL LOW (ref 1.7–2.4)

## 2023-05-08 LAB — CORTISOL: Cortisol, Plasma: 6.7 ug/dL

## 2023-05-08 MED ORDER — TAMSULOSIN HCL 0.4 MG PO CAPS
0.4000 mg | ORAL_CAPSULE | Freq: Every day | ORAL | Status: DC
Start: 2023-05-09 — End: 2023-05-09

## 2023-05-08 MED ORDER — POTASSIUM CHLORIDE CRYS ER 20 MEQ PO TBCR
40.0000 meq | EXTENDED_RELEASE_TABLET | Freq: Once | ORAL | Status: AC
  Administered 2023-05-08: 40 meq via ORAL
  Filled 2023-05-08: qty 2

## 2023-05-08 MED ORDER — SODIUM CHLORIDE 0.9 % IV BOLUS
500.0000 mL | Freq: Once | INTRAVENOUS | Status: AC
  Administered 2023-05-08: 500 mL via INTRAVENOUS

## 2023-05-08 MED ORDER — MAGNESIUM SULFATE 4 GM/100ML IV SOLN
4.0000 g | Freq: Once | INTRAVENOUS | Status: AC
  Administered 2023-05-08: 4 g via INTRAVENOUS
  Filled 2023-05-08: qty 100

## 2023-05-08 MED ORDER — MUPIROCIN 2 % EX OINT
TOPICAL_OINTMENT | Freq: Two times a day (BID) | CUTANEOUS | Status: DC
  Filled 2023-05-08: qty 22

## 2023-05-08 MED ORDER — COSYNTROPIN 0.25 MG IJ SOLR
0.2500 mg | Freq: Once | INTRAMUSCULAR | Status: AC
  Administered 2023-05-09: 0.25 mg via INTRAVENOUS
  Filled 2023-05-08: qty 0.25

## 2023-05-08 MED ORDER — SACCHAROMYCES BOULARDII 250 MG PO CAPS
250.0000 mg | ORAL_CAPSULE | Freq: Two times a day (BID) | ORAL | Status: DC
  Administered 2023-05-08 – 2023-05-09 (×3): 250 mg via ORAL
  Filled 2023-05-08 (×4): qty 1

## 2023-05-08 NOTE — Consult Note (Signed)
 WOC Nurse Consult Note:patient with multiple reported falls at home  Reason for Consult: skin tears  Wound type: 1. Full thickness wound head r/t trauma  2.  R elbow full thickness r/t trauma; R anterior forearm full thickness r/t trauma; R shin partial thickness r/t trauma; L shin full thickness r/t trauma; R knee full thickness r/t trauma  Pressure Injury POA: NA not related to pressure  Measurement: see nursing flowsheet  Wound bed: 1.  Head 75% red moist 25% yellow  2.  R elbow red moist, R anterior forearm 75% yellow 25% pink moist; R shin 100% pink moist, L shin 100% pink moist, R knee 100% pink moist  Drainage (amount, consistency, odor) see nursing flowsheet  Periwound: ecchymosis noted to arms and legs Dressing procedure/placement/frequency: Cleanse head wound with NS, apply Bactroban ointment to wound bed 2 times daily and leave open to air.  Cleanse all arm and leg skin tears with NS, apply Xeroform gauze to wound beds every other day, cover with Telfa nonstick dressing and Silicone foam or Kerlix roll gauze and tape whichever is preferred.SOAK DRESSING WITH NS IF STUCK TO WOUND BED FOR ATRAUMATIC REMOVAL.   POC discussed with bedside nurse. WOC team will not follow. Re-consult if further needs arise.   Thank you,    Priscella Mann MSN, RN-BC, [employer] [phone]

## 2023-05-08 NOTE — Progress Notes (Signed)
 [facility] PROGRESS NOTE   Marijean Heath, MD WUJ:[id number] DOB: 20-May-1929 DOA: 05/06/2023  PCP: Kristian Covey, MD  Brief History: 89 y.o. male with medical history significant of asthma, atrial fibrillation, heart failure, coronary artery disease, GERD, hypertension and hyperlipidemia who presented with near syncope.  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 For 4 days he has noted dizziness and lightheadedness while standing rapidly, can be severe at times experiencing near syncope, but per his recollection he has had no loss of consciousness. Symptoms improved with seating.  Old records personally reviewed last cardiology visit 03/11/23, noted pulmonary hypertension, with severe tricuspid regurgitation. Taking as needed furosemide.  Symptomatic paroxysmal atrial fibrillation triggered by physical or emotional stress. In the past atenolol and diltiazem were discontinued due to low BP and orthostatic symptoms.   Consultants: None  Procedures: Echocardiogram    Subjective/Interval History: Patient feels better this morning.  Denies any bleeding from any site.  Denies any chest pain shortness of breath.  Has not been out of the bed yet.  Did have loose stools yesterday which is not unusual per patient.    Assessment/Plan:  Orthostatic hypotension with near syncope Longstanding history of orthostatic hypotension.  He is not on any blood pressure lowering agents.  Noted to be on Flomax which he takes at nighttime. There was some concern for dehydration due to diarrheal illness but patient mentioned that having loose stools is not unusual for him. Echocardiogram showed normal LVEF with grade 3 diastolic dysfunction.  Moderate aortic valve stenosis was noted. Patient was given IV fluids. Started on midodrine and given TED stockings to wear. Blood pressures appear to have improved.  Orthostatics were better last night.  Await  orthostatics this morning to see if he is able to ambulate.    Normocytic anemia Has longstanding history of anemia.   Drop in hemoglobin is likely dilutional.  No evidence for overt bleeding.  Will recheck hemoglobin later this morning.   Patient wants to defer further workup to outpatient setting.  This is reasonable.  Paroxysmal atrial fibrillation Noted to be on Tikosyn.  Anticoagulated with apixaban. Followed by Dr. Shirlee Latch with cardiology.  Diarrhea Not an acute issue per patient.  Probiotics.  Imodium as needed.  Chronic kidney disease stage IIIa Stable.  Coronary artery disease Stable.  Pulmonary hypertension/chronic diastolic CHF/moderate aortic stenosis Echocardiogram showed LVEF of 60 to 65% with grade 3 diastolic dysfunction.  No evidence for volume overload currently.  If at all he was hypovolemic at admission.  Continue to monitor off of diuretics. Outpatient follow-up with cardiology.  History of stroke Stable.  Depression Continue with home medications.   DVT Prophylaxis: On apixaban Code Status: Full code Family Communication: Discussed with patient.  Will update wife later today. Disposition Plan: Anticipate discharge later today.      Medications: Scheduled:  apixaban  5 mg Oral BID   atorvastatin  20 mg Oral Daily   budesonide  2 mL Inhalation BID   buPROPion  150 mg Oral Daily   dofetilide  250 mcg Oral BID   FLUoxetine  10 mg Oral Daily   lactase  3,000 Units Oral TID WC   loratadine  10 mg Oral Daily   midodrine  10 mg Oral TID WC   omeprazole  20 mg Oral Daily   saccharomyces boulardii  250 mg Oral BID   tamsulosin  0.4 mg Oral QPC supper   Continuous:  magnesium sulfate  bolus IVPB     WUJ:[id number], LORazepam, Polyethyl Glycol-Propyl Glycol   Objective:  Vital Signs  Vitals:   05/07/23 2312 05/08/23 0330 05/08/23 0739 05/08/23 0753  BP: 114/60 107/61  124/87  Pulse: 87 84  93  Resp: 19 16  16   Temp: 98 F (36.7 C) 99 F  (37.2 C)  98.2 F (36.8 C)  TempSrc: Oral Oral  Oral  SpO2: 98% 95% 96%   Weight:      Height:        Intake/Output Summary (Last 24 hours) at 05/08/2023 0926 Last data filed at 05/07/2023 2312 Gross per 24 hour  Intake 360 ml  Output 200 ml  Net 160 ml   Filed Weights   05/06/23 1512  Weight: 66.8 kg    General appearance: Awake alert.  In no distress Resp: Clear to auscultation bilaterally.  Normal effort Cardio: S1-S2 is normal regular.  No S3-S4.  No rubs murmurs or bruit GI: Abdomen is soft.  Nontender nondistended.  Bowel sounds are present normal.  No masses organomegaly   Lab Results:  Data Reviewed: I have personally reviewed following labs and reports of the imaging studies  CBC: Recent Labs  Lab 05/06/23 0221 05/07/23 0319 05/08/23 0442  WBC 12.1* 9.9 7.9  HGB 9.3* 8.5* 7.9*  HCT 28.9* 26.7* 24.1*  MCV 96.7 95.4 93.1  PLT 227 191 164    Basic Metabolic Panel: Recent Labs  Lab 05/06/23 0221 05/07/23 0319 05/08/23 0442  NA 135 136 134*  K 4.3 3.9 3.7  CL 103 105 105  CO2 24 25 24   GLUCOSE 130* 107* 99  BUN 29* 21 20  CREATININE 1.46* 1.15 1.08  CALCIUM 8.6* 8.5* 7.9*  MG  --   --  1.6*    GFR: Estimated Creatinine Clearance: 42.1 mL/min (by C-G formula based on SCr of 1.08 mg/dL).  Liver Function Tests: Recent Labs  Lab 05/06/23 0221  AST 18  ALT 16  ALKPHOS 64  BILITOT 0.7  PROT 5.6*  ALBUMIN 2.8*    Coagulation Profile: Recent Labs  Lab 05/06/23 0221  INR 1.6*     Radiology Studies: ECHOCARDIOGRAM COMPLETE Result Date: 05/06/2023    ECHOCARDIOGRAM REPORT   Patient Name:   DR. Marijean Heath Date of Exam: 05/06/2023 Medical Rec #:  [id number]         Height:       70.0 in Accession #:    [id number]        Weight:       145.2 lb Date of Birth:  Jun 27, 1929         BSA:          1.822 m Patient Age:    89 years          BP:           121/60 mmHg Patient Gender: M                 HR:           83 bpm. Exam Location:  Inpatient  Procedure: 2D Echo (Both Spectral and Color Flow Doppler were utilized during            procedure). Indications:    dyspnea  History:        Patient has prior history of Echocardiogram examinations, most                 recent 09/22/2017. CAD, chronic kidney disease and Pulmonary HTN,  Signs/Symptoms:Syncope; Risk Factors:Hypertension and                 Dyslipidemia.  Sonographer:    Delcie Roch RDCS Referring Phys: [id number] MAURICIO DANIEL ARRIEN IMPRESSIONS  1. Left ventricular ejection fraction, by estimation, is 60 to 65%. The left ventricle has normal function. The left ventricle has no regional wall motion abnormalities. Left ventricular diastolic parameters are consistent with Grade III diastolic dysfunction (restrictive). Elevated left atrial pressure. The E/e' is 18.  2. Right ventricular systolic function is normal. The right ventricular size is normal. There is severely elevated pulmonary artery systolic pressure. The estimated right ventricular systolic pressure is 61.7 mmHg.  3. Left atrial size was moderately dilated.  4. Right atrial size was mildly dilated.  5. The mitral valve is degenerative. Mild mitral valve regurgitation. No evidence of mitral stenosis.  6. Tricuspid valve regurgitation is severe.  7. The aortic valve is calcified. Aortic valve regurgitation is mild. Moderate aortic valve stenosis.  8. The inferior vena cava is normal in size with greater than 50% respiratory variability, suggesting right atrial pressure of 3 mmHg. FINDINGS  Left Ventricle: Left ventricular ejection fraction, by estimation, is 60 to 65%. The left ventricle has normal function. The left ventricle has no regional wall motion abnormalities. The left ventricular internal cavity size was normal in size. There is  no left ventricular hypertrophy. Left ventricular diastolic parameters are consistent with Grade III diastolic dysfunction (restrictive). Elevated left atrial pressure. The E/e' is 24.  Right Ventricle: The right ventricular size is normal. No increase in right ventricular wall thickness. Right ventricular systolic function is normal. There is severely elevated pulmonary artery systolic pressure. The tricuspid regurgitant velocity is 3.83 m/s, and with an assumed right atrial pressure of 3 mmHg, the estimated right ventricular systolic pressure is 61.7 mmHg. Left Atrium: Left atrial size was moderately dilated. Right Atrium: Right atrial size was mildly dilated. Pericardium: There is no evidence of pericardial effusion. Mitral Valve: The mitral valve is degenerative in appearance. Mild mitral valve regurgitation. No evidence of mitral valve stenosis. Tricuspid Valve: The tricuspid valve is grossly normal. Tricuspid valve regurgitation is severe. No evidence of tricuspid stenosis. Aortic Valve: The aortic valve is calcified. Aortic valve regurgitation is mild. Aortic regurgitation PHT measures 349 msec. Moderate aortic stenosis is present. Aortic valve mean gradient measures 15.0 mmHg. Aortic valve peak gradient measures 27.0 mmHg. Aortic valve area, by VTI measures 1.21 cm. Pulmonic Valve: The pulmonic valve was not well visualized. Pulmonic valve regurgitation is trivial. No evidence of pulmonic stenosis. Aorta: The aortic root and ascending aorta are structurally normal, with no evidence of dilitation. Venous: The inferior vena cava is normal in size with greater than 50% respiratory variability, suggesting right atrial pressure of 3 mmHg. IAS/Shunts: The interatrial septum was not well visualized.  LEFT VENTRICLE PLAX 2D LVIDd:         4.00 cm   Diastology LVIDs:         2.50 cm   LV e' medial:    7.07 cm/s LV PW:         1.00 cm   LV E/e' medial:  19.8 LV IVS:        1.00 cm   LV e' lateral:   8.70 cm/s LVOT diam:     2.10 cm   LV E/e' lateral: 16.1 LV SV:         63 LV SV Index:   35 LVOT Area:  3.46 cm  RIGHT VENTRICLE             IVC RV Basal diam:  3.40 cm     IVC diam: 1.80 cm RV S  prime:     19.10 cm/s TAPSE (M-mode): 2.0 cm LEFT ATRIUM           Index        RIGHT ATRIUM           Index LA diam:      4.50 cm 2.47 cm/m   RA Area:     20.50 cm LA Vol (A4C): 87.7 ml 48.14 ml/m  RA Volume:   59.70 ml  32.77 ml/m  AORTIC VALVE AV Area (Vmax):    1.31 cm AV Area (Vmean):   1.23 cm AV Area (VTI):     1.21 cm AV Vmax:           260.00 cm/s AV Vmean:          180.000 cm/s AV VTI:            0.522 m AV Peak Grad:      27.0 mmHg AV Mean Grad:      15.0 mmHg LVOT Vmax:         98.25 cm/s LVOT Vmean:        63.800 cm/s LVOT VTI:          0.183 m LVOT/AV VTI ratio: 0.35 AI PHT:            349 msec  AORTA Ao Root diam: 3.20 cm Ao Asc diam:  3.60 cm MITRAL VALVE                TRICUSPID VALVE MV Area (PHT): 3.27 cm     TR Peak grad:   58.7 mmHg MV Decel Time: 232 msec     TR Vmax:        383.00 cm/s MR Peak grad: 90.6 mmHg MR Mean grad: 65.0 mmHg     SHUNTS MR Vmax:      476.00 cm/s   Systemic VTI:  0.18 m MR Vmean:     393.0 cm/s    Systemic Diam: 2.10 cm MV E velocity: 140.00 cm/s MV A velocity: 71.70 cm/s MV E/A ratio:  1.95 Sunit Tolia Electronically signed by Tessa Lerner Signature Date/Time: 05/06/2023/7:36:54 PM    Final        LOS: 1 day   Osvaldo Shipper  [facility] Pager on [email or web address]  05/08/2023, 9:26 AM

## 2023-05-08 NOTE — Progress Notes (Signed)
 Physical Therapy Treatment Patient Details Name: [name] GOSSMAN, MD MRN: [id number] DOB: 1929-04-24 Today's Date: 05/08/2023   History of Present Illness Pt is a 89 y.o. male presenting 05/06/23 s/p fall in the bathroom where he hit the back of his head on the bathtub. Admitted with syncope. Head and neck imaging were unremarkable. PMH HTN, pulmonary HTN, A-fib, hx of CVA, HF, CKD 3a, CAD, GERD, depression, HLD    PT Comments  Session focused on theract to promote safe transfers with the least restrictive AD and gait training to promote safe ambulation with the least restrictive AD. While pt continues to report and displayed lightheadedness/dizziness along with a decrease in BP with positional changes, he was able to walk around 200 ft with no increased symptoms after standing. However once pt stopped walking and stood statically to get another BP measurement, he developed symptoms and was returned to sitting. Pt's decreases in BP with positional changes continues to be the pt's biggest limiting factor and they would benefit from continued acute PT as additional options for managing their BP is explored. Pt would benefit from continued mobility with nurses and mobility specialists until d/c. Will continue to follow acutely. Coordinated with MD about getting pt an abdominal binder for future sessions.    Pt's BP (TED hose on)  Supine: 104/67 (79) EOB: 98/51 (65) Standing 0 min: 62/48 (54) Standing after walking 8 ft around bed: 67/60 (36) Recliner after walking 200 ft: 71/50 (58) EOS recliner: 100/51 (66)       If plan is discharge home, recommend the following: A lot of help with walking and/or transfers;A little help with bathing/dressing/bathroom;Assistance with cooking/housework;Assist for transportation;Help with stairs or ramp for entrance   Can travel by private vehicle        Equipment Recommendations  BSC/3in1    Recommendations for Other Services       Precautions /  Restrictions Precautions Precautions: Fall Recall of Precautions/Restrictions: Intact Precaution/Restrictions Comments: watch orthostatics Restrictions Weight Bearing Restrictions Per Provider Order: No     Mobility  Bed Mobility Overal bed mobility: Needs Assistance Bed Mobility: Supine to Sit           General bed mobility comments: pt supervision for supine to sit s/t HOB elevation and safety    Transfers Overall transfer level: Needs assistance Equipment used: Rollator (4 wheels) Transfers: Sit to/from Stand Sit to Stand: Contact guard assist, Min assist                Ambulation/Gait Ambulation/Gait assistance: Contact guard assist Gait Distance (Feet): 208 Feet (2 bouts, 8 ft around bed; 2nd 200 ft) Assistive device: Rollator (4 wheels) Gait Pattern/deviations: Step-through pattern, Trunk flexed, Decreased stride length Gait velocity: normal Gait velocity interpretation: >2.62 ft/sec, indicative of community ambulatory   General Gait Details: pt takes short steps and increases trunk flexion as he fatigues. With gait, pt reports lightheadedness/dizziness doesn't change and he displays no signs of increased symptoms.   Stairs             Wheelchair Mobility     Tilt Bed    Modified Rankin (Stroke Patients Only)       Balance Overall balance assessment: Needs assistance Sitting-balance support: No upper extremity supported, Feet supported Sitting balance-Leahy Scale: Good Sitting balance - Comments: pt sits EOB without LOB or directional lean   Standing balance support: Bilateral upper extremity supported, During functional activity Standing balance-Leahy Scale: Poor Standing balance comment: pt stands with increased BOS and trunk  flexion. Reports dizziness upon standing that decreases once he sits down.                            Communication Communication Communication: Impaired Factors Affecting Communication: Hearing  impaired (has hearing aides)  Cognition Arousal: Alert Behavior During Therapy: WFL for tasks assessed/performed   PT - Cognitive impairments: No apparent impairments                         Following commands: Intact      Cueing Cueing Techniques: Verbal cues, Gestural cues  Exercises      General Comments General comments (skin integrity, edema, etc.): See PT comments for BP readings      Pertinent Vitals/Pain Pain Assessment Pain Assessment: No/denies pain    Home Living                          Prior Function            PT Goals (current goals can now be found in the care plan section) Acute Rehab PT Goals Patient Stated Goal: return home PT Goal Formulation: With patient/family Time For Goal Achievement: 05/21/23 Potential to Achieve Goals: Good Progress towards PT goals: Progressing toward goals    Frequency    Min 2X/week      PT Plan      Co-evaluation              AM-PAC PT "6 Clicks" Mobility   Outcome Measure  Help needed turning from your back to your side while in a flat bed without using bedrails?: A Little Help needed moving from lying on your back to sitting on the side of a flat bed without using bedrails?: A Little Help needed moving to and from a bed to a chair (including a wheelchair)?: A Little Help needed standing up from a chair using your arms (e.g., wheelchair or bedside chair)?: A Little Help needed to walk in hospital room?: A Little Help needed climbing 3-5 steps with a railing? : A Lot 6 Click Score: 17    End of Session Equipment Utilized During Treatment: Gait belt Activity Tolerance: Treatment limited secondary to medical complications (Comment) (limited by [facility]) Patient left: in chair;with call bell/phone within reach;with nursing/sitter in room;with family/visitor present (RN notified there is no chair alarm wall unit in pt's room.) Nurse Communication: Other (comment);Mobility status (BP, RN  notified there is no chair alarm wall unit in pt's room.) PT Visit Diagnosis: Unsteadiness on feet (R26.81);Other abnormalities of gait and mobility (R26.89);Muscle weakness (generalized) (M62.81);Difficulty in walking, not elsewhere classified (R26.2);Dizziness and giddiness (R42);History of falling (Z91.81)     Time: 1610-9604 PT Time Calculation (min) (ACUTE ONLY): 34 min  Charges:    $Gait Training: 8-22 mins $Therapeutic Activity: 8-22 mins PT General Charges $$ ACUTE PT VISIT: 1 Visit                     [address], SPT    [place] 05/08/2023, 1:13 PM

## 2023-05-08 NOTE — Plan of Care (Signed)

## 2023-05-09 DIAGNOSIS — I951 Orthostatic hypotension: Secondary | ICD-10-CM | POA: Diagnosis not present

## 2023-05-09 LAB — BASIC METABOLIC PANEL WITH GFR
Anion gap: 7 (ref 5–15)
BUN: 23 mg/dL (ref 8–23)
CO2: 22 mmol/L (ref 22–32)
Calcium: 8.3 mg/dL — ABNORMAL LOW (ref 8.9–10.3)
Chloride: 104 mmol/L (ref 98–111)
Creatinine, Ser: 1.23 mg/dL (ref 0.61–1.24)
GFR, Estimated: 55 mL/min — ABNORMAL LOW (ref >60)
Glucose, Bld: 119 mg/dL — ABNORMAL HIGH (ref 70–99)
Potassium: 4.6 mmol/L (ref 3.5–5.1)
Sodium: 133 mmol/L — ABNORMAL LOW (ref 135–145)

## 2023-05-09 LAB — MAGNESIUM: Magnesium: 2.1 mg/dL (ref 1.7–2.4)

## 2023-05-09 MED ORDER — SACCHAROMYCES BOULARDII 250 MG PO CAPS: 250.0000 mg | ORAL_CAPSULE | Freq: Two times a day (BID) | ORAL | 0 refills | Status: AC

## 2023-05-09 MED ORDER — LOPERAMIDE HCL 2 MG PO CAPS: 4.0000 mg | ORAL_CAPSULE | Freq: Three times a day (TID) | ORAL | 0 refills | Status: DC | PRN

## 2023-05-09 MED ORDER — MIDODRINE HCL 10 MG PO TABS: 10.0000 mg | ORAL_TABLET | Freq: Three times a day (TID) | ORAL | 0 refills | Status: DC

## 2023-05-09 NOTE — Progress Notes (Signed)
 Physical Therapy Treatment Patient Details Name: [name] RAFFERTY, MD MRN: [id number] DOB: 1929-04-01 Today's Date: 05/09/2023   History of Present Illness Pt is a 89 y.o. male presenting 05/06/23 s/p fall in the bathroom where he hit the back of his head on the bathtub. Admitted with syncope. Head and neck imaging were unremarkable. PMH HTN, pulmonary HTN, A-fib, hx of CVA, HF, CKD 3a, CAD, GERD, depression, HLD    PT Comments  Patient progressing with mobility and able to tolerate hallway ambulation and stair training not reporting symptoms wearing binder and TED stockings.  Still encouraged seated rest on rollator prior to stair negotiation.  Patient needing cues for safe walker use and stand to sit.  Also reviewed precautions for orthostatic hypotension to sit if feeling weak or light headed initially standing and to sit on rollator seat if having any symptoms as well.  Recommend HHPT at d/c and noted for home today.     If plan is discharge home, recommend the following: A little help with bathing/dressing/bathroom;Assistance with cooking/housework;Assist for transportation;Help with stairs or ramp for entrance   Can travel by private vehicle        Equipment Recommendations  BSC/3in1    Recommendations for Other Services       Precautions / Restrictions Precautions Precautions: Fall Precaution/Restrictions Comments: watch orthostatics; abd binder, TEDs     Mobility  Bed Mobility               General bed mobility comments: in recliner    Transfers Overall transfer level: Needs assistance Equipment used: Rollator (4 wheels) Transfers: Sit to/from Stand Sit to Stand: Supervision           General transfer comment: standing prior to gown donned and cues for sitting back down; states normally has uncontrolled descent (plops) when he sits, education on safety for preventing falls    Ambulation/Gait Ambulation/Gait assistance: Contact guard assist, Supervision Gait  Distance (Feet): 200 Feet (100' x 2) Assistive device: Rollator (4 wheels) Gait Pattern/deviations: Step-through pattern, Decreased stride length, Trunk flexed       General Gait Details: using rollator for practice with some cues for brakes and how to unlock and reorient after sitting in hallway   Stairs Stairs: Yes Stairs assistance: Contact guard assist, Supervision Stair Management: Two rails, Alternating pattern, Forwards, Backwards Number of Stairs: 4 General stair comments: CGA for safety, forward to ascent using both rails and stepping backward to descend despite cues for turning around to descend   Wheelchair Mobility     Tilt Bed    Modified Rankin (Stroke Patients Only)       Balance Overall balance assessment: Needs assistance Sitting-balance support: No upper extremity supported, Feet supported Sitting balance-Leahy Scale: Good Sitting balance - Comments: pt sits EOB without LOB or directional lean     Standing balance-Leahy Scale: Fair Standing balance comment: standing unsupported initially                            Communication Communication Communication: Impaired Factors Affecting Communication: Hearing impaired (has hearing aides)  Cognition Arousal: Alert Behavior During Therapy: WFL for tasks assessed/performed   PT - Cognitive impairments: No apparent impairments                         Following commands: Intact      Cueing    Exercises      General Comments General  comments (skin integrity, edema, etc.): BP 84/55 after ambulation and stairs with one seated rest, wearing Teds and binder      Pertinent Vitals/Pain Pain Assessment Pain Assessment: No/denies pain    Home Living                          Prior Function            PT Goals (current goals can now be found in the care plan section) Progress towards PT goals: Progressing toward goals    Frequency    Min 2X/week      PT  Plan      Co-evaluation              AM-PAC PT "6 Clicks" Mobility   Outcome Measure  Help needed turning from your back to your side while in a flat bed without using bedrails?: A Little Help needed moving from lying on your back to sitting on the side of a flat bed without using bedrails?: A Little Help needed moving to and from a bed to a chair (including a wheelchair)?: A Little Help needed standing up from a chair using your arms (e.g., wheelchair or bedside chair)?: A Little Help needed to walk in hospital room?: A Little Help needed climbing 3-5 steps with a railing? : A Little 6 Click Score: 18    End of Session Equipment Utilized During Treatment: Gait belt (binder and TEDs) Activity Tolerance: Patient tolerated treatment well Patient left: in chair;with call bell/phone within reach   PT Visit Diagnosis: Other abnormalities of gait and mobility (R26.89);Muscle weakness (generalized) (M62.81);Difficulty in walking, not elsewhere classified (R26.2);Dizziness and giddiness (R42)     Time: 9604-5409 PT Time Calculation (min) (ACUTE ONLY): 25 min  Charges:    $Gait Training: 8-22 mins $Self Care/Home Management: 8-22 PT General Charges $$ ACUTE PT VISIT: 1 Visit                     [name], PT Acute Rehabilitation Services [phone] 05/09/2023    [name] [name] 05/09/2023, 12:46 PM

## 2023-05-09 NOTE — TOC Initial Note (Signed)
 Transition of Care [facility]) - Initial/Assessment Note    Patient Details  Name: [name] JUNCAJ, MD MRN: [id number] Date of Birth: Jul 10, 1929  Transition [facility]) CM/SW Contact:    Gala Lewandowsky, RN Phone Number: 05/09/2023, 10:56 AM  Clinical Narrative: Patient presented for syncopal episode. PTA patient was from home with spouse. Case Manager discussed home health services with the patient and he asked me to call spouse. Case Manager spoke with spouse and she states patient has used [facility] in the past and wants to use them again. Case Manager submitted referral to [facility] and they can accept the patient for services. Start of care to begin within 24-48 hours post transition home. Case Manager discussed DME 3n1 and the patient declined. Spouse will transport patient home in private vehicle. No further needs identified at this time.                  Expected Discharge Plan: Home w Home Health Services Barriers to Discharge: No Barriers Identified   Patient Goals and CMS Choice Patient states their goals for this hospitalization and ongoing recovery are:: plan to return home   Choice offered to / list presented to : Spouse (has used [facility] in the past and wants to use them again.)   Expected Discharge Plan and Services In-house Referral: NA Discharge Planning Services: CM Consult Post Acute Care Choice: Home Health Living arrangements for the past 2 months: Single Family Home Expected Discharge Date: 05/09/23                HH Arranged: PT, OT HH Agency: [facility] Date [facility] Agency Contacted: 05/09/23 Time HH Agency Contacted: 1054 Representative spoke with at [facility] Agency: Haywood Lasso  Prior Living Arrangements/Services Living arrangements for the past 2 months: Single Family Home Lives with:: Spouse Patient language and need for interpreter reviewed:: Yes        Need for Family Participation in Patient Care: Yes (Comment) Care giver support  system in place?: Yes (comment)   Criminal Activity/Legal Involvement Pertinent to Current Situation/Hospitalization: No - Comment as needed  Activities of Daily Living   ADL Screening (condition at time of admission) Independently performs ADLs?: No Does the patient have a NEW difficulty with bathing/dressing/toileting/self-feeding that is expected to last >3 days?: No (needs assist) Does the patient have a NEW difficulty with getting in/out of bed, walking, or climbing stairs that is expected to last >3 days?: No (needs assist) Does the patient have a NEW difficulty with communication that is expected to last >3 days?: No Is the patient deaf or have difficulty hearing?: Yes Does the patient have difficulty seeing, even when wearing glasses/contacts?: No Does the patient have difficulty concentrating, remembering, or making decisions?: Yes  Permission Sought/Granted Permission sought to share information with : Case Manager, Magazine features editor, Family Supports       Permission granted to share info w AGENCY: [facility]  Emotional Assessment Appearance:: Appears stated age Attitude/Demeanor/Rapport: Engaged Affect (typically observed): Appropriate Orientation: : Oriented to Self, Oriented to Situation, Oriented to  Time, Oriented to Place Alcohol / Substance Use: Not Applicable Psych Involvement: No (comment)  Admission diagnosis:  Syncope [R55] AKI (acute kidney injury) (HCC) [N17.9] Minor head injury, initial encounter [S09.90XA] Syncope, unspecified syncope type [R55] Orthostatic hypotension [I95.1] Patient Active Problem List   Diagnosis Date Noted   Syncope 05/06/2023   Depression 05/06/2023   CAP (community acquired pneumonia) 04/13/2022  Spontaneous hemorrhage 08/27/2021   Stage 3a chronic kidney disease (CKD) (HCC) 08/27/2021   Orthostatic hypotension 08/27/2021   Prolonged QT interval 03/05/2021   Abnormal x-ray of pelvis 11/17 12/27/2018    BPH associated with nocturia 11/21/2017   Arthritis of carpometacarpal (CMC) joint of right thumb 07/06/2016   Right foot pain 02/11/2016   Abnormality of gait 11/27/2015   Pain in joint, ankle and foot 11/27/2015   History of asthma 10/23/2015   Chronic diastolic heart failure (HCC)    Hypersensitivity reaction 04/01/2015   History of CVA (cerebrovascular accident) 03/31/2015   Visit for monitoring Tikosyn therapy 03/11/2015   Paroxysmal atrial fibrillation (HCC)    Pulmonary hypertension (HCC) 09/26/2014   Bradycardia 12/21/2012   CAD S/P OM PCI 2010 12/16/2008   Hyperlipidemia 06/20/2007   Anxiety state 06/20/2007   Essential hypertension 06/20/2007   Allergic rhinitis 06/20/2007   Mild persistent asthma without complication 06/20/2007   GERD 06/20/2007   PCP:  Kristian Covey, MD Pharmacy:   [facility] [phone] - [place], [place] - [address] AT [facility] [address] [place] [address] Phone: [phone] Fax: [phone]  Social Drivers of Health (SDOH) Social History: SDOH Screenings   Food Insecurity: No Food Insecurity (05/06/2023)  Housing: Low Risk  (05/06/2023)  Transportation Needs: No Transportation Needs (05/06/2023)  Utilities: Not At Risk (05/06/2023)  Alcohol Screen: Low Risk  (04/19/2022)  Depression (PHQ2-9): Low Risk  (04/20/2022)  Financial Resource Strain: Low Risk  (04/19/2022)  Physical Activity: Insufficiently Active (04/19/2022)  Social Connections: Moderately Isolated (05/06/2023)  Stress: No Stress Concern Present (04/19/2022)  Tobacco Use: Low Risk  (05/06/2023)   Readmission Risk Interventions     No data to display

## 2023-05-09 NOTE — Discharge Summary (Signed)
 [facility]  Physician Discharge Summary   Patient ID: GLENWOOD REVOIR, MD MRN: [id number] DOB/AGE: [birth year] 89 y.o.  Admit date: 05/06/2023 Discharge date: 05/09/2023    PCP: Kristian Covey, MD  DISCHARGE DIAGNOSES:    Paroxysmal atrial fibrillation (HCC)   Orthostatic hypotension   Essential hypertension   Stage 3a chronic kidney disease (CKD) (HCC)   CAD S/P OM PCI 2010   Pulmonary hypertension (HCC)   Chronic diastolic heart failure (HCC)   GERD   History of CVA (cerebrovascular accident)   Depression   RECOMMENDATIONS FOR OUTPATIENT FOLLOW UP: Cardiology to schedule outpatient follow-up   Home Health: PT OT Equipment/Devices: None  CODE STATUS: Full code  DISCHARGE CONDITION: fair  Diet recommendation: As before  INITIAL HISTORY: 89 y.o. male with medical history significant of asthma, atrial fibrillation, heart failure, coronary artery disease, GERD, hypertension and hyperlipidemia who presented with near syncope.  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 For 4 days he has noted dizziness and lightheadedness while standing rapidly, can be severe at times experiencing near syncope, but per his recollection he has had no loss of consciousness. Symptoms improved with seating.  Old records personally reviewed last cardiology visit 03/11/23, noted pulmonary hypertension, with severe tricuspid regurgitation. Taking as needed furosemide.  Symptomatic paroxysmal atrial fibrillation triggered by physical or emotional stress. In the past atenolol and diltiazem were discontinued due to low BP and orthostatic symptoms.    Consultants: None   Procedures: Echocardiogram  HOSPITAL COURSE:   Orthostatic hypotension with near syncope Longstanding history of orthostatic hypotension.  He is not on any blood pressure lowering agents.  Noted to be on Flomax which he takes at nighttime. There was some concern for dehydration due  to diarrheal illness but patient mentioned that having loose stools is not unusual for him. Echocardiogram showed normal LVEF with grade 3 diastolic dysfunction.  Moderate aortic valve stenosis was noted. Patient was given IV fluids. Started on midodrine and given TED stockings to wear. Blood pressures appear to have improved.  Was able to ambulate this morning without difficulty.  Wants to go home.  Okay for discharge.   Normocytic anemia Has longstanding history of anemia.   Drop in hemoglobin is likely dilutional.  No evidence for overt bleeding.     Paroxysmal atrial fibrillation Noted to be on Tikosyn.  Anticoagulated with apixaban. Followed by Dr. Shirlee Latch with cardiology.  Cardiology to schedule outpatient follow-up.   Diarrhea Not an acute issue per patient.  Probiotics.  Imodium as needed.   Chronic kidney disease stage IIIa Stable.   Coronary artery disease Stable.   Pulmonary hypertension/chronic diastolic CHF/moderate aortic stenosis Echocardiogram showed LVEF of 60 to 65% with grade 3 diastolic dysfunction.   Outpatient follow-up with cardiology.   History of stroke Stable.   Depression Continue with home medications.   Patient is stable.  Okay for discharge home today.  Home health has been ordered.   PERTINENT LABS:  The results of significant diagnostics from this hospitalization (including imaging, microbiology, ancillary and laboratory) are listed below for reference.      Labs:   Basic Metabolic Panel: Recent Labs  Lab 05/06/23 0221 05/07/23 0319 05/08/23 0442 05/09/23 0913  NA 135 136 134* 133*  K 4.3 3.9 3.7 4.6  CL 103 105 105 104  CO2 24 25 24 22   GLUCOSE 130* 107* 99 119*  BUN 29* 21 20 23   CREATININE 1.46* 1.15 1.08 1.23  CALCIUM 8.6* 8.5* 7.9* 8.3*  MG  --   --  1.6* 2.1   Liver Function Tests: Recent Labs  Lab 05/06/23 0221  AST 18  ALT 16  ALKPHOS 64  BILITOT 0.7  PROT 5.6*  ALBUMIN 2.8*    CBC: Recent Labs  Lab  05/06/23 0221 05/07/23 0319 05/08/23 0442 05/08/23 1019  WBC 12.1* 9.9 7.9  --   HGB 9.3* 8.5* 7.9* 8.1*  HCT 28.9* 26.7* 24.1* 24.9*  MCV 96.7 95.4 93.1  --   PLT 227 191 164  --     IMAGING STUDIES ECHOCARDIOGRAM COMPLETE Result Date: 05/06/2023    ECHOCARDIOGRAM REPORT   Patient Name:   DR. Marijean Heath Date of Exam: 05/06/2023 Medical Rec #:  [id number]         Height:       70.0 in Accession #:    [id number]        Weight:       145.2 lb Date of Birth:  1929-12-23         BSA:          1.822 m Patient Age:    89 years          BP:           121/60 mmHg Patient Gender: M                 HR:           83 bpm. Exam Location:  Inpatient Procedure: 2D Echo (Both Spectral and Color Flow Doppler were utilized during            procedure). Indications:    dyspnea  History:        Patient has prior history of Echocardiogram examinations, most                 recent 09/22/2017. CAD, chronic kidney disease and Pulmonary HTN,                 Signs/Symptoms:Syncope; Risk Factors:Hypertension and                 Dyslipidemia.  Sonographer:    Delcie Roch RDCS Referring Phys: [id number] MAURICIO DANIEL ARRIEN IMPRESSIONS  1. Left ventricular ejection fraction, by estimation, is 60 to 65%. The left ventricle has normal function. The left ventricle has no regional wall motion abnormalities. Left ventricular diastolic parameters are consistent with Grade III diastolic dysfunction (restrictive). Elevated left atrial pressure. The E/e' is 18.  2. Right ventricular systolic function is normal. The right ventricular size is normal. There is severely elevated pulmonary artery systolic pressure. The estimated right ventricular systolic pressure is 61.7 mmHg.  3. Left atrial size was moderately dilated.  4. Right atrial size was mildly dilated.  5. The mitral valve is degenerative. Mild mitral valve regurgitation. No evidence of mitral stenosis.  6. Tricuspid valve regurgitation is severe.  7. The aortic valve is  calcified. Aortic valve regurgitation is mild. Moderate aortic valve stenosis.  8. The inferior vena cava is normal in size with greater than 50% respiratory variability, suggesting right atrial pressure of 3 mmHg. FINDINGS  Left Ventricle: Left ventricular ejection fraction, by estimation, is 60 to 65%. The left ventricle has normal function. The left ventricle has no regional wall motion abnormalities. The left ventricular internal cavity size was normal in size. There is  no left ventricular hypertrophy. Left ventricular diastolic parameters are consistent with Grade III diastolic dysfunction (restrictive). Elevated left  atrial pressure. The E/e' is 49. Right Ventricle: The right ventricular size is normal. No increase in right ventricular wall thickness. Right ventricular systolic function is normal. There is severely elevated pulmonary artery systolic pressure. The tricuspid regurgitant velocity is 3.83 m/s, and with an assumed right atrial pressure of 3 mmHg, the estimated right ventricular systolic pressure is 61.7 mmHg. Left Atrium: Left atrial size was moderately dilated. Right Atrium: Right atrial size was mildly dilated. Pericardium: There is no evidence of pericardial effusion. Mitral Valve: The mitral valve is degenerative in appearance. Mild mitral valve regurgitation. No evidence of mitral valve stenosis. Tricuspid Valve: The tricuspid valve is grossly normal. Tricuspid valve regurgitation is severe. No evidence of tricuspid stenosis. Aortic Valve: The aortic valve is calcified. Aortic valve regurgitation is mild. Aortic regurgitation PHT measures 349 msec. Moderate aortic stenosis is present. Aortic valve mean gradient measures 15.0 mmHg. Aortic valve peak gradient measures 27.0 mmHg. Aortic valve area, by VTI measures 1.21 cm. Pulmonic Valve: The pulmonic valve was not well visualized. Pulmonic valve regurgitation is trivial. No evidence of pulmonic stenosis. Aorta: The aortic root and ascending  aorta are structurally normal, with no evidence of dilitation. Venous: The inferior vena cava is normal in size with greater than 50% respiratory variability, suggesting right atrial pressure of 3 mmHg. IAS/Shunts: The interatrial septum was not well visualized.  LEFT VENTRICLE PLAX 2D LVIDd:         4.00 cm   Diastology LVIDs:         2.50 cm   LV e' medial:    7.07 cm/s LV PW:         1.00 cm   LV E/e' medial:  19.8 LV IVS:        1.00 cm   LV e' lateral:   8.70 cm/s LVOT diam:     2.10 cm   LV E/e' lateral: 16.1 LV SV:         63 LV SV Index:   35 LVOT Area:     3.46 cm  RIGHT VENTRICLE             IVC RV Basal diam:  3.40 cm     IVC diam: 1.80 cm RV S prime:     19.10 cm/s TAPSE (M-mode): 2.0 cm LEFT ATRIUM           Index        RIGHT ATRIUM           Index LA diam:      4.50 cm 2.47 cm/m   RA Area:     20.50 cm LA Vol (A4C): 87.7 ml 48.14 ml/m  RA Volume:   59.70 ml  32.77 ml/m  AORTIC VALVE AV Area (Vmax):    1.31 cm AV Area (Vmean):   1.23 cm AV Area (VTI):     1.21 cm AV Vmax:           260.00 cm/s AV Vmean:          180.000 cm/s AV VTI:            0.522 m AV Peak Grad:      27.0 mmHg AV Mean Grad:      15.0 mmHg LVOT Vmax:         98.25 cm/s LVOT Vmean:        63.800 cm/s LVOT VTI:          0.183 m LVOT/AV VTI ratio: 0.35 AI PHT:  349 msec  AORTA Ao Root diam: 3.20 cm Ao Asc diam:  3.60 cm MITRAL VALVE                TRICUSPID VALVE MV Area (PHT): 3.27 cm     TR Peak grad:   58.7 mmHg MV Decel Time: 232 msec     TR Vmax:        383.00 cm/s MR Peak grad: 90.6 mmHg MR Mean grad: 65.0 mmHg     SHUNTS MR Vmax:      476.00 cm/s   Systemic VTI:  0.18 m MR Vmean:     393.0 cm/s    Systemic Diam: 2.10 cm MV E velocity: 140.00 cm/s MV A velocity: 71.70 cm/s MV E/A ratio:  1.95 Sunit Tolia Electronically signed by Tessa Lerner Signature Date/Time: 05/06/2023/7:36:54 PM    Final    CT Head Wo Contrast Result Date: 05/06/2023 CLINICAL DATA:  Head trauma, minor (Age >= 65y); Neck trauma (Age >= 65y)  EXAM: CT HEAD WITHOUT CONTRAST CT CERVICAL SPINE WITHOUT CONTRAST TECHNIQUE: Multidetector CT imaging of the head and cervical spine was performed following the standard protocol without intravenous contrast. Multiplanar CT image reconstructions of the cervical spine were also generated. RADIATION DOSE REDUCTION: This exam was performed according to the departmental dose-optimization program which includes automated exposure control, adjustment of the mA and/or kV according to patient size and/or use of iterative reconstruction technique. COMPARISON:  CT head December 26, 2018. FINDINGS: CT HEAD FINDINGS Brain: Similar remote right frontal and parietal infarcts. No evidence of acute large vascular territory infarct, acute hemorrhage, mass lesion, midline shift or hydrocephalus. Cerebral atrophy. Vascular: No hyperdense vessel. Skull: No acute fracture. Sinuses/Orbits: Paranasal sinus mucosal thickening with complete opacification of the right maxillary sinus, ethmoid air cells and right frontal sinus. CT CERVICAL SPINE FINDINGS Alignment: No substantial sagittal subluxation. Skull base and vertebrae: Vertebral body heights are maintained. No evidence of acute fracture. Soft tissues and spinal canal: No prevertebral fluid or swelling. No visible canal hematoma. Disc levels:  Mild for age bony degenerative change. Upper chest: Visualized lung apices are clear. IMPRESSION: 1. No evidence of acute abnormality intracranially or in the cervical spine. 2. Severe paranasal sinus disease. Electronically Signed   By: Feliberto Harts M.D.   On: 05/06/2023 03:21   CT Cervical Spine Wo Contrast Result Date: 05/06/2023 CLINICAL DATA:  Head trauma, minor (Age >= 65y); Neck trauma (Age >= 65y) EXAM: CT HEAD WITHOUT CONTRAST CT CERVICAL SPINE WITHOUT CONTRAST TECHNIQUE: Multidetector CT imaging of the head and cervical spine was performed following the standard protocol without intravenous contrast. Multiplanar CT image  reconstructions of the cervical spine were also generated. RADIATION DOSE REDUCTION: This exam was performed according to the departmental dose-optimization program which includes automated exposure control, adjustment of the mA and/or kV according to patient size and/or use of iterative reconstruction technique. COMPARISON:  CT head December 26, 2018. FINDINGS: CT HEAD FINDINGS Brain: Similar remote right frontal and parietal infarcts. No evidence of acute large vascular territory infarct, acute hemorrhage, mass lesion, midline shift or hydrocephalus. Cerebral atrophy. Vascular: No hyperdense vessel. Skull: No acute fracture. Sinuses/Orbits: Paranasal sinus mucosal thickening with complete opacification of the right maxillary sinus, ethmoid air cells and right frontal sinus. CT CERVICAL SPINE FINDINGS Alignment: No substantial sagittal subluxation. Skull base and vertebrae: Vertebral body heights are maintained. No evidence of acute fracture. Soft tissues and spinal canal: No prevertebral fluid or swelling. No visible canal hematoma. Disc levels:  Mild  for age bony degenerative change. Upper chest: Visualized lung apices are clear. IMPRESSION: 1. No evidence of acute abnormality intracranially or in the cervical spine. 2. Severe paranasal sinus disease. Electronically Signed   By: Feliberto Harts M.D.   On: 05/06/2023 03:21   DG Chest Port 1 View Result Date: 05/06/2023 CLINICAL DATA:  Recent fall with chest pain, initial encounter EXAM: PORTABLE CHEST 1 VIEW COMPARISON:  04/13/2022 FINDINGS: Cardiac shadow is within normal limits. Aortic calcifications are again noted. Lungs are well aerated bilaterally. No focal infiltrate or effusion is seen. No bony abnormality is noted. IMPRESSION: No acute abnormality noted. Electronically Signed   By: Alcide Clever M.D.   On: 05/06/2023 02:48    DISCHARGE EXAMINATION: Vitals:   05/08/23 0753 05/08/23 1250 05/08/23 2010 05/09/23 0406  BP: 124/87 (!) 100/51 127/62  (!) 114/57  Pulse: 93 91 80 80  Resp: 16 17 19 17   Temp: 98.2 F (36.8 C) 97.9 F (36.6 C) 97.7 F (36.5 C) 97.9 F (36.6 C)  TempSrc: Oral Oral Oral Oral  SpO2:   100% 93%  Weight:      Height:       General appearance: Awake alert.  In no distress Resp: Clear to auscultation bilaterally.  Normal effort Cardio: S1-S2 is normal regular.  No S3-S4.  No rubs murmurs or bruit GI: Abdomen is soft.  Nontender nondistended.  Bowel sounds are present normal.  No masses organomegaly    DISPOSITION: Home  Discharge Instructions     Call MD for:  difficulty breathing, headache or visual disturbances   Complete by: As directed    Call MD for:  extreme fatigue   Complete by: As directed    Call MD for:  persistant dizziness or light-headedness   Complete by: As directed    Call MD for:  persistant nausea and vomiting   Complete by: As directed    Call MD for:  severe uncontrolled pain   Complete by: As directed    Call MD for:  temperature >100.4   Complete by: As directed    Diet - low sodium heart healthy   Complete by: As directed    Discharge instructions   Complete by: As directed    A message has been sent to your cardiologist to schedule outpatient follow-up.  Please follow-up with your primary care provider as well.  Take your medications as prescribed.  The next step would be to consider Florinef if your symptoms persist.  You were cared for by a hospitalist during your hospital stay. If you have any questions about your discharge medications or the care you received while you were in the hospital after you are discharged, you can call the unit and asked to speak with the hospitalist on call if the hospitalist that took care of you is not available. Once you are discharged, your primary care physician will handle any further medical issues. Please note that NO REFILLS for any discharge medications will be authorized once you are discharged, as it is imperative that you return  to your primary care physician (or establish a relationship with a primary care physician if you do not have one) for your aftercare needs so that they can reassess your need for medications and monitor your lab values. If you do not have a primary care physician, you can call [phone] for a physician referral.   Discharge wound care:   Complete by: As directed    Wound care  Every other day  Comments: Cleanse all arm and leg skin tears with NS, apply Xeroform gauze to wound beds every other day, cover with Telfa nonstick dressing and Silicone foam or Kerlix roll gauze and tape whichever is preferred.SOAK DRESSING WITH NS IF STUCK TO WOUND BED FOR ATRAUMATIC REMOVAL.   Wound care  2 times daily      Comments: Cleanse head wound with NS, apply Mupirocin ointment to wound bed 2 times daily and leave open to air.   Increase activity slowly   Complete by: As directed          Allergies as of 05/09/2023       Reactions   Tylenol [acetaminophen] Rash   Lactose Intolerance (gi) Diarrhea, Nausea Only   Levaquin [levofloxacin]    Triggered A-fib    Advil [ibuprofen] Nausea Only, Rash   Asa [aspirin] Rash        Medication List     TAKE these medications    apixaban 5 MG Tabs tablet Commonly known as: ELIQUIS Take 1 tablet (5 mg total) by mouth 2 (two) times daily.   Arnuity Ellipta 200 MCG/ACT Aepb Generic drug: Fluticasone Furoate Inhale 1 puff into the lungs daily.   atorvastatin 20 MG tablet Commonly known as: LIPITOR TAKE 1 TABLET BY MOUTH EVERY DAY   buPROPion 150 MG 12 hr tablet Commonly known as: WELLBUTRIN SR TAKE 1 TABLET(150 MG) BY MOUTH DAILY   cetirizine 10 MG tablet Commonly known as: ZYRTEC Take 10 mg by mouth daily.   denosumab 60 MG/ML Sosy injection Commonly known as: PROLIA Inject 60 mg into the skin every 6 (six) months.   dofetilide 250 MCG capsule Commonly known as: TIKOSYN TAKE 1 CAPSULE(250 MCG) BY MOUTH TWICE DAILY   FLUoxetine 10 MG  capsule Commonly known as: PROZAC TAKE 1 CAPSULE(10 MG) BY MOUTH DAILY   LACTAID PO Take 1 tablet by mouth 3 (three) times daily with meals as needed (lactose intolerance).   loperamide 2 MG capsule Commonly known as: IMODIUM Take 2 capsules (4 mg total) by mouth 3 (three) times daily as needed for diarrhea or loose stools.   LORazepam 1 MG tablet Commonly known as: ATIVAN TAKE 1/2 TO 1 TABLET(0.5 TO 1 MG) BY MOUTH AT BEDTIME AS NEEDED   midodrine 10 MG tablet Commonly known as: PROAMATINE Take 1 tablet (10 mg total) by mouth 3 (three) times daily with meals.   omeprazole 20 MG tablet Commonly known as: PRILOSEC OTC Take 1 tablet (20 mg total) by mouth daily.   saccharomyces boulardii 250 MG capsule Commonly known as: FLORASTOR Take 1 capsule (250 mg total) by mouth 2 (two) times daily for 14 days.   SYSTANE BALANCE OP Place 1 drop into both eyes daily as needed (for dry eyes).   SYSTANE OP Place 1 drop into both eyes 4 (four) times daily as needed (dry eye).   tamsulosin 0.4 MG Caps capsule Commonly known as: FLOMAX TAKE 1 CAPSULE(0.4 MG) BY MOUTH DAILY   traMADol 50 MG tablet Commonly known as: ULTRAM Take 1 tablet (50 mg total) by mouth every 6 (six) hours as needed. What changed: reasons to take this   VITAMIN D-3 PO Take 1 capsule by mouth at bedtime.               Discharge Care Instructions  (From admission, onward)           Start     Ordered   05/09/23 0000  Discharge wound care:  Comments: Wound care  Every other day    Comments: Cleanse all arm and leg skin tears with NS, apply Xeroform gauze to wound beds every other day, cover with Telfa nonstick dressing and Silicone foam or Kerlix roll gauze and tape whichever is preferred.SOAK DRESSING WITH NS IF STUCK TO WOUND BED FOR ATRAUMATIC REMOVAL.   Wound care  2 times daily      Comments: Cleanse head wound with NS, apply Mupirocin ointment to wound bed 2 times daily and leave open to  air.   05/09/23 1035              Follow-up Information     Burchette, Elberta Fortis, MD. Schedule an appointment as soon as possible for a visit in 1 week(s).   Specialty: Family Medicine Why: post hospitalization follow up Contact information: [address]ena Flake  [facility] [place] [address] [phone]         Laurey Morale, MD Follow up.   Specialty: Cardiology Why: His office will call to schedule appointment. Contact information: [address] [place] [address] [place] [address] [phone]         [facility] Follow up.   Specialty: Home Health Services Why: Physical and Occupational Therapy-office to call with visit times. Contact information: [address] [place] [address] [phone]                 TOTAL DISCHARGE TIME: 35 minutes  Brittony Billick [employer] on [email or web address]  05/10/2023, 11:32 AM

## 2023-05-09 NOTE — Progress Notes (Signed)
 Occupational Therapy Treatment Patient Details Name: [name] GASTINEAU, MD MRN: [id number] DOB: Nov 29, 1929 Today's Date: 05/09/2023   History of present illness Pt is a 89 y.o. male presenting 05/06/23 s/p fall in the bathroom where he hit the back of his head on the bathtub. Admitted with syncope. Head and neck imaging were unremarkable. PMH HTN, pulmonary HTN, A-fib, hx of CVA, HF, CKD 3a, CAD, GERD, depression, HLD   OT comments  Pt continues to move well using RW and able to manage ADLs with no more than CGA. Despite use of TED hose and abdominal binder, orthostatic hypotension still noted though pt asymptomatic. Reinforced safety precautions and encouraged continued BP checks at home to maximize safety with pt in agreement.   BP supine: 123/74 (88) BP sitting EOB: 100/49 (66) BP sitting EOB > 5 min: 78/59 (67) BP standing: 68/49 (56) BP standing > 5 min: 77/48 (58) BP in chair with feet elevated: 114/64 (78)      If plan is discharge home, recommend the following:  A little help with walking and/or transfers;A little help with bathing/dressing/bathroom;Assistance with cooking/housework;Help with stairs or ramp for entrance   Equipment Recommendations  None recommended by OT    Recommendations for Other Services      Precautions / Restrictions Precautions Precautions: Fall Recall of Precautions/Restrictions: Intact Precaution/Restrictions Comments: watch orthostatics Restrictions Weight Bearing Restrictions Per Provider Order: No       Mobility Bed Mobility Overal bed mobility: Modified Independent Bed Mobility: Supine to Sit     Supine to sit: Modified independent (Device/Increase time)          Transfers Overall transfer level: Needs assistance Equipment used: Rolling walker (2 wheels) Transfers: Sit to/from Stand Sit to Stand: Supervision, Contact guard assist           General transfer comment: CGA from bedside with RW. pt pulling up on RW w/ OT cueing to  push from bedside for easier transfer. Supervision from toilet using grab bar and one hand on RW     Balance Overall balance assessment: Needs assistance Sitting-balance support: No upper extremity supported, Feet supported Sitting balance-Leahy Scale: Good     Standing balance support: Bilateral upper extremity supported, During functional activity Standing balance-Leahy Scale: Fair Standing balance comment: able to stand unsupported at sink without DME, RW needed for mobility                           ADL either performed or assessed with clinical judgement   ADL Overall ADL's : Needs assistance/impaired     Grooming: Supervision/safety;Standing;Oral care;Wash/dry face Grooming Details (indicate cue type and reason): standing at sink, cues to step inside RW to get closer to sink         Upper Body Dressing : Set up;Sitting Upper Body Dressing Details (indicate cue type and reason): changing gown though did need assist to manage abdominal binder Lower Body Dressing: Contact guard assist;Sit to/from stand;Sitting/lateral leans   Toilet Transfer: Contact guard assist;Ambulation;Rolling walker (2 wheels)   Toileting- Clothing Manipulation and Hygiene: Supervision/safety;Sit to/from stand;Sitting/lateral lean       Functional mobility during ADLs: Contact guard assist;Rolling walker (2 wheels)      Extremity/Trunk Assessment Upper Extremity Assessment Upper Extremity Assessment: Overall WFL for tasks assessed;Right hand dominant   Lower Extremity Assessment Lower Extremity Assessment: Defer to PT evaluation        Vision   Vision Assessment?: No apparent visual deficits  Perception     Praxis     Communication Communication Communication: Impaired Factors Affecting Communication: Hearing impaired (has hearing aides)   Cognition Arousal: Alert Behavior During Therapy: WFL for tasks assessed/performed Cognition: No apparent impairments              OT - Cognition Comments: WFL, shows good insight into medical complexities. cues for safe DME use needed at times                 Following commands: Intact        Cueing   Cueing Techniques: Verbal cues, Gestural cues  Exercises      Shoulder Instructions       General Comments      Pertinent Vitals/ Pain       Pain Assessment Pain Assessment: No/denies pain  Home Living                                          Prior Functioning/Environment              Frequency  Min 2X/week        Progress Toward Goals  OT Goals(current goals can now be found in the care plan section)  Progress towards OT goals: Progressing toward goals  Acute Rehab OT Goals Patient Stated Goal: home soon OT Goal Formulation: With patient Time For Goal Achievement: 05/21/23 Potential to Achieve Goals: Good ADL Goals Pt Will Perform Lower Body Bathing: with modified independence;sit to/from stand Pt Will Transfer to Toilet: with modified independence;ambulating Additional ADL Goal #1: Pt to verbalize at least 3 fall prevention strategies to implement at home in relation to orthostatic hypotension Additional ADL Goal #2: Pt to increase standing activity tolerance > 10 min during ADLs/mobility without seated rest break or significant BP drop  Plan      Co-evaluation                 AM-PAC OT "6 Clicks" Daily Activity     Outcome Measure   Help from another person eating meals?: None Help from another person taking care of personal grooming?: A Little Help from another person toileting, which includes using toliet, bedpan, or urinal?: A Little Help from another person bathing (including washing, rinsing, drying)?: A Little Help from another person to put on and taking off regular upper body clothing?: A Little Help from another person to put on and taking off regular lower body clothing?: A Little 6 Click Score: 19    End of Session Equipment  Utilized During Treatment: Gait belt;Rolling walker (2 wheels)  OT Visit Diagnosis: Unsteadiness on feet (R26.81)   Activity Tolerance Patient tolerated treatment well   Patient Left in chair;with call bell/phone within reach;Other (comment) (no chair alarm box in room. RN aware)   Nurse Communication Mobility status;Other (comment)        Time: 1610-9604 OT Time Calculation (min): 42 min  Charges: OT General Charges $OT Visit: 1 Visit OT Treatments $Self Care/Home Management : 23-37 mins $Therapeutic Activity: 8-22 mins  Bradd Canary, OTR/L Acute Rehab Services Office: [phone]   Lorre Munroe 05/09/2023, 9:43 AM

## 2023-05-10 ENCOUNTER — Telehealth: Payer: Self-pay

## 2023-05-10 NOTE — Transitions of Care (Post Inpatient/ED Visit) (Signed)
 05/10/2023  Name: [name] CAST, MD MRN: [id number] DOB: 01-15-1930  Today's TOC FU Call Status: Today's TOC FU Call Status:: Successful TOC FU Call Completed TOC FU Call Complete Date: 05/10/23 Patient's Name and Date of Birth confirmed.  Transition Care Management Follow-up Telephone Call Date of Discharge: 05/09/23 Discharge Facility: [facility]) Type of Discharge: Inpatient Admission Primary Inpatient Discharge Diagnosis:: syncopal How have you been since you were released from the hospital?: Better (reports he is feeling better. Reports that he feels dizzy when he stands.) Any questions or concerns?: No  Items Reviewed: Did you receive and understand the discharge instructions provided?: Yes Medications obtained,verified, and reconciled?: Yes (Medications Reviewed) Any new allergies since your discharge?: No Dietary orders reviewed?: NA Do you have support at home?: Yes People in Home: spouse Name of Support/Comfort Primary Source: Wife and a caregiver  Medications Reviewed Today: Medications Reviewed Today     Reviewed by Earlie Server, RN (Registered Nurse) on 05/10/23 at 1152  Med List Status: <None>   Medication Order Taking? Sig Documenting Provider Last Dose Status Informant  apixaban (ELIQUIS) 5 MG TABS tablet [id number] Yes Take 1 tablet (5 mg total) by mouth 2 (two) times daily. Laurey Morale, MD Taking Active Self, Pharmacy Records  atorvastatin (LIPITOR) 20 MG tablet [id number] Yes TAKE 1 TABLET BY MOUTH EVERY DAY Burchette, Elberta Fortis, MD Taking Active Self, Pharmacy Records  buPROPion [facility]) 150 MG 12 hr tablet [id number] Yes TAKE 1 TABLET(150 MG) BY MOUTH DAILY Burchette, Elberta Fortis, MD Taking Active Self, Pharmacy Records  cetirizine (ZYRTEC) 10 MG tablet [id number] Yes Take 10 mg by mouth daily. [provider] Taking Active Self, Pharmacy Records  Cholecalciferol (VITAMIN D-3 PO) [id number] Yes Take 1 capsule by mouth at bedtime. [provider] Taking Active Self, Pharmacy Records  denosumab (PROLIA) 60 MG/ML SOSY injection [id number] Yes Inject 60 mg into the skin every 6 (six) months. [provider] Taking Active Self, Pharmacy Records  dofetilide [facility]) 250 MCG capsule [id number] Yes TAKE 1 CAPSULE(250 MCG) BY MOUTH TWICE DAILY Laurey Morale, MD Taking Active Self, Pharmacy Records  FLUoxetine (PROZAC) 10 MG capsule [id number] Yes TAKE 1 CAPSULE(10 MG) BY MOUTH DAILY Burchette, Elberta Fortis, MD Taking Active Self, Pharmacy Records  Fluticasone Furoate (ARNUITY ELLIPTA) 200 MCG/ACT AEPB [id number] Yes Inhale 1 puff into the lungs daily. Kristian Covey, MD Taking Active Self, Pharmacy Records  Lactase (LACTAID PO) [id number] Yes Take 1 tablet by mouth 3 (three) times daily with meals as needed (lactose intolerance). [provider] Taking Active Self, Pharmacy Records  loperamide (IMODIUM) 2 MG capsule [id number] Yes Take 2 capsules (4 mg total) by mouth 3 (three) times daily as needed for diarrhea or loose stools. Osvaldo Shipper, MD Taking Active   LORazepam (ATIVAN) 1 MG tablet [id number] Yes TAKE 1/2 TO 1 TABLET(0.5 TO 1 MG) BY MOUTH AT BEDTIME AS NEEDED Burchette, Elberta Fortis, MD Taking Active Self, Pharmacy Records           Med Note (WHITE, TONIA S   Fri May 06, 2023  6:20 AM) For sleep  midodrine (PROAMATINE) 10 MG tablet [id number] Yes Take 1 tablet (10 mg total) by mouth 3 (three) times daily with meals. Osvaldo Shipper, MD Taking Active   omeprazole (PRILOSEC OTC) 20 MG tablet [id number] Yes Take 1 tablet (20 mg total) by mouth daily. [name], MD Taking Active Self, Pharmacy Records  Polyethyl Glycol-Propyl Glycol [place] [facility]) [id number] Yes Place 1  drop into both eyes 4 (four) times daily as needed (dry eye). [provider] Taking Active Self, Pharmacy Records  Propylene Glycol [facility]) [id number] Yes Place 1 drop into both eyes daily as needed (for dry eyes). [provider] Taking Active Self, Pharmacy Records  saccharomyces boulardii (FLORASTOR) 250 MG capsule [id number] Yes Take 1 capsule (250 mg total) by mouth 2 (two) times daily for 14 days. Osvaldo Shipper, MD Taking Active   tamsulosin [facility]) 0.4 MG CAPS capsule [id number] Yes TAKE 1 CAPSULE(0.4 MG) BY MOUTH DAILY Burchette, Elberta Fortis, MD Taking Active Self, Pharmacy Records  traMADol (ULTRAM) 50 MG tablet [id number] Yes Take 1 tablet (50 mg total) by mouth every 6 (six) hours as needed.  Patient taking differently: Take 50 mg by mouth every 6 (six) hours as needed for moderate pain (pain score 4-6) (and headache).   Kristian Covey, MD Taking Active Self, Pharmacy Records            Home Care and Equipment/Supplies: Were Home Health Services Ordered?: No Any new equipment or medical supplies ordered?: No  Functional Questionnaire: Do you need assistance with bathing/showering or dressing?: Yes (care giver) Do you need assistance with meal preparation?: Yes (care giver) Do you need assistance with eating?: No Do you have difficulty maintaining continence: No Do you need assistance with getting out of bed/getting out of a chair/moving?: Yes (dizziness- uses a walker) Do you have difficulty managing or taking your medications?: No  Follow up appointments reviewed: PCP Follow-up appointment confirmed?: Yes Date of PCP follow-up appointment?: 05/11/23 Follow-up Provider: [facility] Follow-up appointment confirmed?: NA Do you need transportation to your follow-up appointment?: No Do you understand care options if your condition(s) worsen?: Yes-patient verbalized understanding  SDOH Interventions Today    Flowsheet Row Most Recent Value  SDOH Interventions   Food Insecurity Interventions Intervention Not Indicated  Housing Interventions Intervention Not Indicated  Transportation Interventions Intervention Not Indicated  Utilities Interventions Intervention Not  Indicated      Interventions Today    Flowsheet Row Most Recent Value  Chronic Disease   Chronic disease during today's visit Other, Congestive Heart Failure (CHF)  [syncopal]  General Interventions   General Interventions Discussed/Reviewed General Interventions Discussed, General Interventions Reviewed, Doctor Visits  Doctor Visits Discussed/Reviewed Doctor Visits Discussed, PCP  PCP/Specialist Visits Compliance with follow-up visit  Education Interventions   Education Provided Provided Education  [Reviewed wound care. Reviewed with patient the importance of changing positions slowly.]  Provided Verbal Education On Nutrition, Medication, Insurance Plans  Nutrition Interventions   Nutrition Discussed/Reviewed Nutrition Discussed  Pharmacy Interventions   Pharmacy Dicussed/Reviewed Medications and their functions  Safety Interventions   Safety Discussed/Reviewed Fall Risk  [reviewed fall risk and importance of changing positions slowly.]      TOC Interventions Today    Flowsheet Row Most Recent Value  TOC Interventions   TOC Interventions Discussed/Reviewed TOC Interventions Discussed, TOC Interventions Reviewed, Post op wound/incision care, S/S of infection, Post discharge activity limitations per provider       Patient reports that he is doing well. Reports a dizzy spell today. States that he is learning to get up and down slowly.  Reviewed with patient the importance of changing positions slowly.  Reports that patient has a caregiver to assist with needs.   Reviewed and offered 30 day TOC program and patient declined. Lonia Chimera, RN, BSN, CEN [employer]- Transition of Care Team.  [facility] [phone]

## 2023-05-11 ENCOUNTER — Encounter: Payer: Self-pay | Admitting: Family Medicine

## 2023-05-11 ENCOUNTER — Ambulatory Visit: Admitting: Family Medicine

## 2023-05-11 VITALS — BP 100/54 | HR 86 | Wt 149.0 lb

## 2023-05-11 DIAGNOSIS — R531 Weakness: Secondary | ICD-10-CM

## 2023-05-11 DIAGNOSIS — I951 Orthostatic hypotension: Secondary | ICD-10-CM | POA: Diagnosis not present

## 2023-05-11 DIAGNOSIS — R5383 Other fatigue: Secondary | ICD-10-CM | POA: Diagnosis not present

## 2023-05-11 MED ORDER — ATORVASTATIN CALCIUM 20 MG PO TABS: ORAL_TABLET | ORAL | 3 refills | Status: DC

## 2023-05-11 MED ORDER — BUPROPION HCL ER (SR) 150 MG PO TB12: ORAL_TABLET | ORAL | 3 refills | Status: DC

## 2023-05-11 MED ORDER — FLUOXETINE HCL 10 MG PO CAPS: 10.0000 mg | ORAL_CAPSULE | Freq: Every day | ORAL | 3 refills | Status: DC

## 2023-05-11 NOTE — Progress Notes (Unsigned)
 Established Patient Office Visit  Subjective   Patient ID: [name] Heath, MD, male    DOB: Jun 20, 1929  Age: 89 y.o. 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  He has history of atrial fibrillation, CAD, GERD, hypertension, hyperlipidemia, diastolic heart failure, asthma history, stage III kidney disease, history of recurrent depression, remote history of CVA.  He lives at home independently with his wife.  For apparently several days prior to admission he was having some increasing dizziness and lightheadedness with standing.  He did not have any actual syncope but near syncope on several occasions.  He does have history of pulmonary hypertension and severe tricuspid regurgitation.  Had been taking furosemide as needed for edema but no regular blood pressure medications.  Previous discontinuation of atenolol and diltiazem secondary to low blood pressure.  Does have history of BPH and takes Flomax 0.4 mg once daily but no other blood pressure lowering medication.  He knows he has been somewhat reluctant to drink fluids in the past because of issues with urinary frequency.  Repeat echo showed normal EF with grade 3 diastolic dysfunction.  Patient was given IV fluids and started on midodrine 10 mg 3 times daily along with TED stockings.  Still having some generalized weakness and occasional lightheadedness with standing but perhaps slightly improved compared to prior to admission.  He has normocytic anemia which is longstanding.  Had some drop in hemoglobin which was likely delusional.  Takes Tikosyn for atrial fibrillation.  On Eliquis.  Followed closely by cardiology.  He apparently does have home PT set up.  They would like to see if they qualify for further home services including aide.  His wife is very independent but has had some chronic back difficulties or self which is  making it difficult for her to assist him in different ways.  Discharge creatinine 1.23.  Hemoglobin 8.1  Past Medical History:  Diagnosis Date   Acute blood loss anemia 08/27/2021   Acute hemorrhagic infarction of brain [facility])    Allergic rhinitis    Anxiety    Asthma    Atrial fibrillation (HCC)    CAD (coronary artery disease)    CHF (congestive heart failure) (HCC)    Depression    Diverticulosis 03/16/1995   DJD (degenerative joint disease)    "most joints" (03/11/2015)   GERD (gastroesophageal reflux disease) 12/30/2000   Heart attack (HCC) 1984   Hiatal hernia 12/30/2000   History of blood transfusion 1988   "related to GI bleeding OR"   History of duodenal ulcer 10/15/1986   Hyperlipidemia    Hypertension    ICH (intracerebral hemorrhage) (HCC)    Sepsis (HCC) 12/2018   Sinus bradycardia    Stroke [facility])    Past Surgical History:  Procedure Laterality Date   CARDIAC CATHETERIZATION     CATARACT EXTRACTION W/ INTRAOCULAR LENS  IMPLANT, BILATERAL Bilateral ?2013   CHOLECYSTECTOMY OPEN  1989   CORONARY ANGIOPLASTY WITH STENT PLACEMENT  2010   hospital ccu  1984   heart attack, hypoplastic right coronary   INGUINAL HERNIA REPAIR Right ~ 1990   NASAL SEPTUM SURGERY  1972   ORCHIECTOMY Bilateral 06/10/2017   Procedure: BILATERAL ORCHIECTOMY;  Surgeon: Marcine Matar, MD;  Location: [facility] ORS;  Service: Urology;  Laterality: Bilateral;  MAC ANESTHESIA AND LOCAL   TONSILLECTOMY     TOTAL KNEE ARTHROPLASTY Bilateral 1998-2004   "  right-left"   VAGOTOMY AND PYLOROPLASTY  1988   "bleeding duodenal ulcer"    reports that he has never smoked. He has never used smokeless tobacco. He reports that he does not drink alcohol and does not use drugs. family history includes Asthma in his brother; Coronary artery disease in his brother and mother; Deep vein thrombosis in his sister. Allergies  Allergen Reactions   Tylenol [Acetaminophen] Rash   Lactose Intolerance (Gi) Diarrhea  and Nausea Only   Levaquin [Levofloxacin]     Triggered A-fib    Advil [Ibuprofen] Nausea Only and Rash   Asa [Aspirin] Rash    Review of Systems  Constitutional:  Positive for malaise/fatigue. Negative for chills and fever.  Eyes:  Negative for blurred vision.  Respiratory:  Negative for shortness of breath.   Cardiovascular:  Negative for chest pain.  Genitourinary:  Negative for dysuria.  Neurological:  Negative for dizziness, weakness and headaches.      Objective:     BP (!) 100/54 (BP Location: Left Arm, Patient Position: Sitting, Cuff Size: Normal)   Pulse 86   Wt 149 lb (67.6 kg)   SpO2 97%   BMI 21.38 kg/m  BP Readings from Last 3 Encounters:  05/11/23 (!) 100/54  05/09/23 (!) 114/57  03/11/23 (!) 92/50   Wt Readings from Last 3 Encounters:  05/11/23 149 lb (67.6 kg)  05/06/23 147 lb 4.8 oz (66.8 kg)  03/11/23 145 lb 3.2 oz (65.9 kg)      Physical Exam Vitals reviewed.  Constitutional:      General: He is not in acute distress. Cardiovascular:     Rate and Rhythm: Normal rate and regular rhythm.  Pulmonary:     Effort: Pulmonary effort is normal.     Breath sounds: Normal breath sounds.  Musculoskeletal:     Comments: TED stockings on bilaterally      No results found for any visits on 05/11/23.  {Labs (Optional):23779}  The ASCVD Risk score (Arnett DK, et al., 2019) failed to calculate for the following reasons:   The 2019 ASCVD risk score is only valid for ages 108 to 48   Risk score cannot be calculated because patient has a medical history suggesting prior/existing ASCVD    Assessment & Plan:   #1 recent hospitalization for orthostatic hypotension with near syncope.  Patient takes Flomax but no antihypertensive medications otherwise.  Concern for some recent dehydration.  Patient received IV fluids and now on midodrine along with TED stockings.  Blood pressure still fairly low today.  Obtained seated blood pressure 90/58 left arm and  standing was 80/50.  Patient relatively asymptomatic.  Does have some persistent mild lightheadedness with standing  #2 history of paroxysmal atrial fibrillation.  Appears to be in sinus rhythm this time.  Is on chronic anticoagulation with apixaban and also treated with Tikosyn.  Continue close follow-up with cardiology  #3 generalized weakness.  Patient and wife would like to get referral placed to first choice home services.  He already has had some physical therapy and they would like to look at getting possibly home aide as well  Evelena Peat, MD

## 2023-05-11 NOTE — Patient Instructions (Signed)
 Consider OTC B12 1,000 mcg daily  Stay well hydrated  Change positions slowly.

## 2023-05-12 ENCOUNTER — Emergency Department (HOSPITAL_COMMUNITY)
Admission: EM | Admit: 2023-05-12 | Discharge: 2023-05-12 | Disposition: A | Discharge status: Home / Self Care | Attending: Emergency Medicine | Admitting: Emergency Medicine

## 2023-05-12 ENCOUNTER — Encounter (HOSPITAL_COMMUNITY): Payer: Self-pay

## 2023-05-12 ENCOUNTER — Emergency Department (HOSPITAL_COMMUNITY)

## 2023-05-12 ENCOUNTER — Other Ambulatory Visit (HOSPITAL_COMMUNITY): Payer: Self-pay

## 2023-05-12 ENCOUNTER — Other Ambulatory Visit: Payer: Self-pay

## 2023-05-12 DIAGNOSIS — I110 Hypertensive heart disease with heart failure: Secondary | ICD-10-CM | POA: Diagnosis not present

## 2023-05-12 DIAGNOSIS — W19XXXA Unspecified fall, initial encounter: Secondary | ICD-10-CM | POA: Diagnosis not present

## 2023-05-12 DIAGNOSIS — D649 Anemia, unspecified: Secondary | ICD-10-CM | POA: Diagnosis not present

## 2023-05-12 DIAGNOSIS — S0990XA Unspecified injury of head, initial encounter: Secondary | ICD-10-CM | POA: Insufficient documentation

## 2023-05-12 DIAGNOSIS — M160 Bilateral primary osteoarthritis of hip: Secondary | ICD-10-CM | POA: Diagnosis not present

## 2023-05-12 DIAGNOSIS — M47812 Spondylosis without myelopathy or radiculopathy, cervical region: Secondary | ICD-10-CM | POA: Diagnosis not present

## 2023-05-12 DIAGNOSIS — S5012XA Contusion of left forearm, initial encounter: Secondary | ICD-10-CM | POA: Diagnosis not present

## 2023-05-12 DIAGNOSIS — R000 Tachycardia, unspecified: Secondary | ICD-10-CM | POA: Diagnosis not present

## 2023-05-12 DIAGNOSIS — I6782 Cerebral ischemia: Secondary | ICD-10-CM | POA: Diagnosis not present

## 2023-05-12 DIAGNOSIS — I509 Heart failure, unspecified: Secondary | ICD-10-CM | POA: Diagnosis not present

## 2023-05-12 DIAGNOSIS — S59912A Unspecified injury of left forearm, initial encounter: Secondary | ICD-10-CM | POA: Diagnosis present

## 2023-05-12 DIAGNOSIS — R55 Syncope and collapse: Secondary | ICD-10-CM | POA: Diagnosis not present

## 2023-05-12 DIAGNOSIS — J45909 Unspecified asthma, uncomplicated: Secondary | ICD-10-CM | POA: Diagnosis not present

## 2023-05-12 DIAGNOSIS — M5020 Other cervical disc displacement, unspecified cervical region: Secondary | ICD-10-CM | POA: Diagnosis not present

## 2023-05-12 DIAGNOSIS — R58 Hemorrhage, not elsewhere classified: Secondary | ICD-10-CM | POA: Diagnosis not present

## 2023-05-12 DIAGNOSIS — R001 Bradycardia, unspecified: Secondary | ICD-10-CM | POA: Diagnosis not present

## 2023-05-12 DIAGNOSIS — S0101XA Laceration without foreign body of scalp, initial encounter: Secondary | ICD-10-CM | POA: Diagnosis not present

## 2023-05-12 DIAGNOSIS — Z79899 Other long term (current) drug therapy: Secondary | ICD-10-CM | POA: Insufficient documentation

## 2023-05-12 DIAGNOSIS — Z043 Encounter for examination and observation following other accident: Secondary | ICD-10-CM | POA: Diagnosis not present

## 2023-05-12 DIAGNOSIS — Z7901 Long term (current) use of anticoagulants: Secondary | ICD-10-CM | POA: Diagnosis not present

## 2023-05-12 LAB — BASIC METABOLIC PANEL WITH GFR
Anion gap: 11 (ref 5–15)
BUN: 24 mg/dL — ABNORMAL HIGH (ref 8–23)
CO2: 21 mmol/L — ABNORMAL LOW (ref 22–32)
Calcium: 8.7 mg/dL — ABNORMAL LOW (ref 8.9–10.3)
Chloride: 104 mmol/L (ref 98–111)
Creatinine, Ser: 1.21 mg/dL (ref 0.61–1.24)
GFR, Estimated: 57 mL/min — ABNORMAL LOW (ref >60)
Glucose, Bld: 108 mg/dL — ABNORMAL HIGH (ref 70–99)
Potassium: 4.8 mmol/L (ref 3.5–5.1)
Sodium: 136 mmol/L (ref 135–145)

## 2023-05-12 LAB — CBC
HCT: 26 % — ABNORMAL LOW (ref 39.0–52.0)
Hemoglobin: 8.1 g/dL — ABNORMAL LOW (ref 13.0–17.0)
MCH: 30.2 pg (ref 26.0–34.0)
MCHC: 31.2 g/dL (ref 30.0–36.0)
MCV: 97 fL (ref 80.0–100.0)
Platelets: 339 10*3/uL (ref 150–400)
RBC: 2.68 MIL/uL — ABNORMAL LOW (ref 4.22–5.81)
RDW: 13.8 % (ref 11.5–15.5)
WBC: 12.1 10*3/uL — ABNORMAL HIGH (ref 4.0–10.5)
nRBC: 0 % (ref 0.0–0.2)

## 2023-05-12 LAB — TROPONIN I (HIGH SENSITIVITY): Troponin I (High Sensitivity): 7 ng/L (ref <18)

## 2023-05-12 LAB — PREPARE RBC (CROSSMATCH)

## 2023-05-12 LAB — ABO/RH: ABO/RH(D): A POS

## 2023-05-12 LAB — POC OCCULT BLOOD, ED: Fecal Occult Bld: NEGATIVE

## 2023-05-12 MED ORDER — DOFETILIDE 250 MCG PO CAPS
250.0000 ug | ORAL_CAPSULE | Freq: Once | ORAL | Status: AC
  Administered 2023-05-12: 250 ug via ORAL
  Filled 2023-05-12: qty 1

## 2023-05-12 MED ORDER — SODIUM CHLORIDE 0.9% IV SOLUTION: Freq: Once | INTRAVENOUS | Status: DC

## 2023-05-12 MED ORDER — MIDODRINE HCL 5 MG PO TABS
10.0000 mg | ORAL_TABLET | Freq: Once | ORAL | Status: AC
  Administered 2023-05-12: 10 mg via ORAL
  Filled 2023-05-12: qty 2

## 2023-05-12 NOTE — Progress Notes (Signed)
 Orthopedic Tech Progress Note Patient Details:  TELL ROZELLE, MD Mar 24, 1929 [id number] Level 2 Trauma. Not needed Patient ID: [name] Heath, MD, male   DOB: 09-Aug-1929, 89 y.o.   MRN: [id number]  [name] [name] 05/12/2023, 9:29 AM

## 2023-05-12 NOTE — TOC Benefit Eligibility Note (Addendum)
 Pharmacy Patient Advocate Encounter  Insurance verification completed.    The patient is insured through [employer]/ [employer]. Patient has Medicare and is not eligible for a copay card, but may be able to apply for patient assistance or Medicare RX Payment Plan (Patient Must reach out to their plan, if eligible for payment plan), if available.    Ran test claim for Droxidopa 100mg  and the current 30 day co-pay is requires a prior authorization.  Authorization approved. Copay for 30 days $541.84.  Case ID: [id number]   This test claim was processed through [facility]- copay amounts may vary at other pharmacies due to pharmacy/plan contracts, or as the patient moves through the different stages of their insurance plan.

## 2023-05-12 NOTE — ED Notes (Signed)
 CCM called for tele admit.

## 2023-05-12 NOTE — Discharge Instructions (Signed)
 Return for any problem.  ?

## 2023-05-12 NOTE — ED Triage Notes (Signed)
 PT BIB EMS for a fall on thinners, fell at home, witnessed fall, syncopal episode,  laceration to the back of his head, no complaints of pain, c-collar in place,   18 R AC  152/76 97% 2 L [place]  100 HR  129 CBG

## 2023-05-12 NOTE — ED Notes (Signed)
 I ambulated patient with walker and wife. Patient did fine, reports no pain or dizziness while walking. Wife is worried about orthostatic vitals so I took orthostatic vitals on him and he did drop in bp sitting and standing and whenever we got back I took it again and it was 98/62 after walking.

## 2023-05-12 NOTE — ED Notes (Signed)
 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 Trauma was activated as a Level 2 based on the following trauma criteria Elderly patients > 65 with head trauma on anti-coagulation (excluding ASA).  Patient cleared for CT by Dr. Rubin Payor. Pt transported to CT with trauma response nurse present to monitor. RN remained with the patient throughout their absence from the department for clinical observation. GCS 15.  History   Past Medical History:  Diagnosis Date   Acute blood loss anemia 08/27/2021   Acute hemorrhagic infarction of brain [facility])    Allergic rhinitis    Anxiety    Asthma    Atrial fibrillation (HCC)    CAD (coronary artery disease)    CHF (congestive heart failure) (HCC)    Depression    Diverticulosis 03/16/1995   DJD (degenerative joint disease)    "most joints" (03/11/2015)   GERD (gastroesophageal reflux disease) 12/30/2000   Heart attack (HCC) 1984   Hiatal hernia 12/30/2000   History of blood transfusion 1988   "related to GI bleeding OR"   History of duodenal ulcer 10/15/1986   Hyperlipidemia    Hypertension    ICH (intracerebral hemorrhage) (HCC)    Sepsis (HCC) 12/2018   Sinus bradycardia    Stroke [facility])      Past Surgical History:  Procedure Laterality Date   CARDIAC CATHETERIZATION     CATARACT EXTRACTION W/ INTRAOCULAR LENS  IMPLANT, BILATERAL Bilateral ?2013   CHOLECYSTECTOMY OPEN  1989   CORONARY ANGIOPLASTY WITH STENT PLACEMENT  2010   hospital ccu  1984   heart attack, hypoplastic right coronary   INGUINAL HERNIA REPAIR Right ~ 1990   NASAL SEPTUM SURGERY  1972   ORCHIECTOMY Bilateral 06/10/2017   Procedure: BILATERAL ORCHIECTOMY;  Surgeon: Marcine Matar, MD;  Location: [facility] ORS;  Service: Urology;  Laterality: Bilateral;  MAC ANESTHESIA AND LOCAL   TONSILLECTOMY     TOTAL KNEE ARTHROPLASTY Bilateral 1998-2004   "right-left"   VAGOTOMY AND PYLOROPLASTY   1988   "bleeding duodenal ulcer"     Initial Focused Assessment (If applicable, or please see trauma documentation): Patient A&Ox4, GCS 15, PERR 3 Airway intact, bilateral breath sounds Pulses 1+, bilateral compression socks C-collar placed by EMS  CT's Completed:   CT Head and CT C-Spine   Interventions:  IV, labs CXR/PXR CT Head/Cspine  Plan for disposition:  Discharge home   Event Summary: Patient to ED after a witnessed fall at home, laceration to back of head. Patient takes Eliquis for Afib. Imaging was ordered and revealed no traumatic injury. Plans for wound care and discharge home. Wife at bedside.  Bedside handoff with ED RN Angelica Chessman.    Jill Side Fenix Rorke  Trauma Response RN  Please call TRN at [phone] for further assistance.

## 2023-05-12 NOTE — ED Provider Notes (Signed)
 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   Fall  Patient presented as a level 2 trauma.  Fall on thinners.  Reportedly syncopal episode.  Hit back of head.  Reportedly confused or unconscious after.  Recent admission to hospital for syncopal episodes thought to be due to orthostasis.  Patient states he feels back at baseline.  Does not remember all the events.  States he did feel dizzy.    Past Medical History:  Diagnosis Date   Acute blood loss anemia 08/27/2021   Acute hemorrhagic infarction of brain [facility])    Allergic rhinitis    Anxiety    Asthma    Atrial fibrillation (HCC)    CAD (coronary artery disease)    CHF (congestive heart failure) (HCC)    Depression    Diverticulosis 03/16/1995   DJD (degenerative joint disease)    "most joints" (03/11/2015)   GERD (gastroesophageal reflux disease) 12/30/2000   Heart attack (HCC) 1984   Hiatal hernia 12/30/2000   History of blood transfusion 1988   "related to GI bleeding OR"   History of duodenal ulcer 10/15/1986   Hyperlipidemia    Hypertension    ICH (intracerebral hemorrhage) (HCC)    Sepsis (HCC) 12/2018   Sinus bradycardia    Stroke (HCC)     Home Medications Prior to Admission medications   Medication Sig Start Date End Date Taking? Authorizing Provider  apixaban (ELIQUIS) 5 MG TABS tablet Take 1 tablet (5 mg total) by mouth 2 (two) times daily. 02/28/23   Laurey Morale, MD  atorvastatin (LIPITOR) 20 MG tablet TAKE 1 TABLET BY MOUTH EVERY DAY 05/11/23   Burchette, Elberta Fortis, MD  buPROPion [facility] SR) 150 MG 12 hr tablet TAKE 1 TABLET(150 MG) BY MOUTH DAILY 05/11/23   Burchette, Elberta Fortis, MD  cetirizine (ZYRTEC) 10 MG tablet Take 10 mg by mouth daily.    [provider]  Cholecalciferol  (VITAMIN D-3 PO) Take 1 capsule by mouth at bedtime.    [provider]  denosumab (PROLIA) 60 MG/ML SOSY injection Inject 60 mg into the skin every 6 (six) months.    [provider]  dofetilide (TIKOSYN) 250 MCG capsule TAKE 1 CAPSULE(250 MCG) BY MOUTH TWICE DAILY 01/28/23   Laurey Morale, MD  FLUoxetine (PROZAC) 10 MG capsule Take 1 capsule (10 mg total) by mouth daily. 05/11/23   Burchette, Elberta Fortis, MD  Fluticasone Furoate (ARNUITY ELLIPTA) 200 MCG/ACT AEPB Inhale 1 puff into the lungs daily. 11/19/22   Burchette, Elberta Fortis, MD  Lactase (LACTAID PO) Take 1 tablet by mouth 3 (three) times daily with meals as needed (lactose intolerance).    [provider]  loperamide (IMODIUM) 2 MG capsule Take 2 capsules (4 mg total) by mouth 3 (three) times daily as needed for diarrhea or loose stools. 05/09/23   Osvaldo Shipper, MD  LORazepam (ATIVAN) 1 MG tablet TAKE 1/2 TO 1 TABLET(0.5 TO 1 MG) BY MOUTH AT BEDTIME AS NEEDED 03/24/23   Burchette, Elberta Fortis, MD  midodrine (PROAMATINE) 10 MG tablet Take 1 tablet (10 mg total) by mouth 3 (three) times daily with meals. 05/09/23   Osvaldo Shipper, MD  omeprazole (PRILOSEC OTC) 20 MG tablet  Take 1 tablet (20 mg total) by mouth daily. 09/17/13   [name], MD  Polyethyl Glycol-Propyl Glycol (SYSTANE OP) Place 1 drop into both eyes 4 (four) times daily as needed (dry eye).    [provider]  Propylene Glycol (SYSTANE BALANCE OP) Place 1 drop into both eyes daily as needed (for dry eyes).    [provider]  saccharomyces boulardii (FLORASTOR) 250 MG capsule Take 1 capsule (250 mg total) by mouth 2 (two) times daily for 14 days. 05/09/23 05/23/23  Osvaldo Shipper, MD  tamsulosin (FLOMAX) 0.4 MG CAPS capsule TAKE 1 CAPSULE(0.4 MG) BY MOUTH DAILY 11/01/22   Burchette, Elberta Fortis, MD  traMADol (ULTRAM) 50 MG tablet Take 1 tablet (50 mg total) by mouth every 6 (six) hours as needed. Patient taking differently: Take 50 mg by mouth  every 6 (six) hours as needed for moderate pain (pain score 4-6) (and headache). 04/13/23   Burchette, Elberta Fortis, MD      Allergies    Tylenol [acetaminophen], Lactose intolerance (gi), Levaquin [levofloxacin], Advil [ibuprofen], and Asa [aspirin]    Review of Systems   Review of Systems  Physical Exam Updated Vital Signs BP 118/74   Pulse 97   Temp 98 F (36.7 C) (Oral)   Resp 16   SpO2 94%  Physical Exam Vitals and nursing note reviewed.  HENT:     Head:     Comments: Scab on occipital area with dried blood. Neck:     Comments: Cervical collar in place.  Mild midline tenderness. Cardiovascular:     Rate and Rhythm: Regular rhythm.  Pulmonary:     Breath sounds: No wheezing.  Abdominal:     Tenderness: There is no abdominal tenderness.  Musculoskeletal:        General: No tenderness.     Comments: Bruising left forearm from previous fall.  Dressing on right elbow from previous fall.  Neurological:     Mental Status: He is alert. Mental status is at baseline.     ED Results / Procedures / Treatments   Labs (all labs ordered are listed, but only abnormal results are displayed) Labs Reviewed  CBC - Abnormal; Notable for the following components:      Result Value   WBC 12.1 (*)    RBC 2.68 (*)    Hemoglobin 8.1 (*)    HCT 26.0 (*)    All other components within normal limits  BASIC METABOLIC PANEL WITH GFR - Abnormal; Notable for the following components:   CO2 21 (*)    Glucose, Bld 108 (*)    BUN 24 (*)    Calcium 8.7 (*)    GFR, Estimated 57 (*)    All other components within normal limits  POC OCCULT BLOOD, ED  TYPE AND SCREEN  PREPARE RBC (CROSSMATCH)  ABO/RH  TROPONIN I (HIGH SENSITIVITY)    EKG EKG Interpretation Date/Time:  Thursday May 12 2023 08:52:07 EDT Ventricular Rate:  93 PR Interval:  179 QRS Duration:  87 QT Interval:  350 QTC Calculation: 436 R Axis:   70  Text Interpretation: Sinus rhythm Atrial premature complex Abnormal R-wave  progression, early transition Borderline repolarization abnormality Confirmed by Benjiman Core [phone]) on 05/12/2023 11:35:23 AM  Radiology DG Pelvis Portable Result Date: 05/12/2023 CLINICAL DATA:  Fall.  Soreness in pelvis. EXAM: PORTABLE PELVIS 1-2 VIEWS COMPARISON:  12/26/2018. FINDINGS: Pelvis is intact with normal and symmetric sacroiliac joints. No acute fracture or dislocation. No aggressive osseous lesion. There  is coarsening of trabecular pattern and associated heterogeneous sclerosis involving the left inferior pubic ramus, which is similar to the prior study and may represent underlying focal Paget's disease. Correlate clinically. Visualized sacral arcuate lines are unremarkable. Unremarkable symphysis pubis. There are mild degenerative changes of bilateral hip joints without significant joint space narrowing. Osteophytosis of the superior acetabulum. No radiopaque foreign bodies. IMPRESSION: No acute osseous abnormality of the pelvis. Electronically Signed   By: Jules Schick M.D.   On: 05/12/2023 09:43   CT HEAD WO CONTRAST ( ) Result Date: 05/12/2023 CLINICAL DATA:  Right history: Head trauma, minor.  Neck trauma. EXAM: CT HEAD WITHOUT CONTRAST CT CERVICAL SPINE WITHOUT CONTRAST TECHNIQUE: Multidetector CT imaging of the head and cervical spine was performed following the standard protocol without intravenous contrast. Multiplanar CT image reconstructions of the cervical spine were also generated. RADIATION DOSE REDUCTION: This exam was performed according to the departmental dose-optimization program which includes automated exposure control, adjustment of the mA and/or kV according to patient size and/or use of iterative reconstruction technique. COMPARISON:  Head CT 05/06/2018 cervical spine CT 05/06/2023. FINDINGS: CT HEAD FINDINGS Brain: Generalized cerebral atrophy. Chronic cortical/subcortical infarcts again demonstrated within the right frontal, right parietal and left occipital  lobes. Background mild patchy and ill-defined hypoattenuation within the cerebral white matter, nonspecific but compatible with chronic small vessel ischemic disease. Prominent perivascular space within the left basal ganglia inferiorly. Partially empty sella turcica. There is no acute intracranial hemorrhage. No acute demarcated cortical infarct. No extra-axial fluid collection. No evidence of an intracranial mass. No midline shift. Vascular: No hyperdense vessel.  Atherosclerotic calcifications. Skull: No calvarial fracture or aggressive osseous lesion. 5 mm osseous protrusion projecting outward from the right frontal calvarium most consistent with an osteoma (series 4, image 59). Sinuses/Orbits: No orbital mass or acute orbital finding. Pansinusitis (most notably severe right frontal, bilateral ethmoid and right maxillary sinusitis). Other: Right posterior scalp hematoma/laceration. A small right anterior scalp hematoma is questioned (series 3, image 15). CT CERVICAL SPINE FINDINGS Alignment: Mild C3-C4 and C4-C5 grade 1 anterolisthesis. Skull base and vertebrae: The basion-dental and atlanto-dental intervals are maintained.No evidence of acute fracture to the cervical spine. Soft tissues and spinal canal: No prevertebral fluid or swelling. No visible canal hematoma. Disc levels: Cervical spondylosis with multilevel disc space narrowing, disc bulges/disc protrusions, endplate spurring, uncovertebral hypertrophy and facet arthropathy. Disc space narrowing is greatest at C5-6 (advance of this level). No appreciable high-grade spinal canal stenosis. Multilevel bony neural foraminal narrowing. Upper chest: No consolidation within the imaged lung apices. No visible pneumothorax. IMPRESSION: CT head: 1. No evidence of an acute intracranial abnormality. 2. Posterior scalp hematoma/laceration. 3. Possible additional small right anterior scalp hematoma. 4. Parenchymal atrophy with chronic small vessel ischemic disease and  chronic infarcts, as described. 5. Pansinusitis. CT cervical spine: 1. No evidence of an acute cervical spine fracture. 2. Mild C3-C4 and C4-C5 grade 1 anterolisthesis. 3. Cervical spondylosis as described. Electronically Signed   By: Jackey Loge D.O.   On: 05/12/2023 09:40   CT Cervical Spine Wo Contrast Result Date: 05/12/2023 CLINICAL DATA:  Right history: Head trauma, minor.  Neck trauma. EXAM: CT HEAD WITHOUT CONTRAST CT CERVICAL SPINE WITHOUT CONTRAST TECHNIQUE: Multidetector CT imaging of the head and cervical spine was performed following the standard protocol without intravenous contrast. Multiplanar CT image reconstructions of the cervical spine were also generated. RADIATION DOSE REDUCTION: This exam was performed according to the departmental dose-optimization program which includes automated exposure control, adjustment of  the mA and/or kV according to patient size and/or use of iterative reconstruction technique. COMPARISON:  Head CT 05/06/2018 cervical spine CT 05/06/2023. FINDINGS: CT HEAD FINDINGS Brain: Generalized cerebral atrophy. Chronic cortical/subcortical infarcts again demonstrated within the right frontal, right parietal and left occipital lobes. Background mild patchy and ill-defined hypoattenuation within the cerebral white matter, nonspecific but compatible with chronic small vessel ischemic disease. Prominent perivascular space within the left basal ganglia inferiorly. Partially empty sella turcica. There is no acute intracranial hemorrhage. No acute demarcated cortical infarct. No extra-axial fluid collection. No evidence of an intracranial mass. No midline shift. Vascular: No hyperdense vessel.  Atherosclerotic calcifications. Skull: No calvarial fracture or aggressive osseous lesion. 5 mm osseous protrusion projecting outward from the right frontal calvarium most consistent with an osteoma (series 4, image 59). Sinuses/Orbits: No orbital mass or acute orbital finding. Pansinusitis  (most notably severe right frontal, bilateral ethmoid and right maxillary sinusitis). Other: Right posterior scalp hematoma/laceration. A small right anterior scalp hematoma is questioned (series 3, image 15). CT CERVICAL SPINE FINDINGS Alignment: Mild C3-C4 and C4-C5 grade 1 anterolisthesis. Skull base and vertebrae: The basion-dental and atlanto-dental intervals are maintained.No evidence of acute fracture to the cervical spine. Soft tissues and spinal canal: No prevertebral fluid or swelling. No visible canal hematoma. Disc levels: Cervical spondylosis with multilevel disc space narrowing, disc bulges/disc protrusions, endplate spurring, uncovertebral hypertrophy and facet arthropathy. Disc space narrowing is greatest at C5-6 (advance of this level). No appreciable high-grade spinal canal stenosis. Multilevel bony neural foraminal narrowing. Upper chest: No consolidation within the imaged lung apices. No visible pneumothorax. IMPRESSION: CT head: 1. No evidence of an acute intracranial abnormality. 2. Posterior scalp hematoma/laceration. 3. Possible additional small right anterior scalp hematoma. 4. Parenchymal atrophy with chronic small vessel ischemic disease and chronic infarcts, as described. 5. Pansinusitis. CT cervical spine: 1. No evidence of an acute cervical spine fracture. 2. Mild C3-C4 and C4-C5 grade 1 anterolisthesis. 3. Cervical spondylosis as described. Electronically Signed   By: Jackey Loge D.O.   On: 05/12/2023 09:40   DG Chest Portable 1 View Result Date: 05/12/2023 CLINICAL DATA:  Fall. EXAM: PORTABLE CHEST 1 VIEW COMPARISON:  05/06/2023. FINDINGS: Bilateral lung fields are clear. Bilateral costophrenic angles are clear. Normal cardio-mediastinal silhouette. No acute osseous abnormalities. The soft tissues are within normal limits. There are surgical staples overlying the GE junction region. IMPRESSION: No active disease. Electronically Signed   By: Jules Schick M.D.   On: 05/12/2023  09:40    Procedures Procedures    Medications Ordered in ED Medications  0.9 %  sodium chloride infusion (Manually program via Guardrails IV Fluids) (has no administration in time range)    ED Course/ Medical Decision Making/ A&P                                 Medical Decision Making Amount and/or Complexity of Data Reviewed Labs: ordered. Radiology: ordered.  Risk Prescription drug management.   Patient with syncopal episode and fall.  Hit head.  On anticoagulation for A-fib.  Differential diagnosis includes syncope and intracranial hemorrhage.  No other apparent extremity injury seen.  Will get chest x-ray and pelvis x-ray.  Will get head CT and cervical spine CT.  Will get basic blood work due to the syncope.  Reviewed recent discharge note and recent follow-up visit.  Hemoglobin is down but stable to discharge.  Does have some bruising on the  right hip potentially could have been a source of the looks like old blood.  No intracranial hemorrhage or cervical spine injury from the fall.  With recurrent syncope.  Blood pressure has been better here although will dip a little with sleeping.  Was started on midodrine but did not take today.  With a hemoglobin down and recurrent syncope will transfuse 1 unit.  Guaiac negative.  Discussed with patient and wife.  Appears that the anemia is somewhat chronic although is decreased from a few months ago.  Scrape on head with no suture possible.  After transfusion will recheck and if able to ambulate likely discharge home with outpatient follow-up.  If not may require admission to hospital.          Final Clinical Impression(s) / ED Diagnoses Final diagnoses:  Fall, initial encounter  Minor head injury, initial encounter  Anemia, unspecified type    Rx / DC Orders ED Discharge Orders     None         Benjiman Core, MD 05/12/23 1520

## 2023-05-12 NOTE — ED Provider Notes (Signed)
 Patient seen after prior EDP.  Patient feels much improved after evaluation.  He desires discharge home.  He declines repeatedly admission for observation.  He and his family understand need for close outpatient follow-up.  Strict return precautions given and understood.   Wynetta Fines, MD 05/12/23 Barry Brunner

## 2023-05-13 ENCOUNTER — Telehealth: Payer: Self-pay

## 2023-05-13 LAB — TYPE AND SCREEN
ABO/RH(D): A POS
Antibody Screen: NEGATIVE
Unit division: 0

## 2023-05-13 LAB — BPAM RBC
Blood Product Expiration Date: 202505012359
ISSUE DATE / TIME: 202504031436
Unit Type and Rh: 6200

## 2023-05-13 NOTE — Transitions of Care (Post Inpatient/ED Visit) (Signed)
   05/13/2023  Name: GRAE CANNATA, MD MRN: [id number] DOB: 12/27/1929  Today's TOC FU Call Status: Today's TOC FU Call Status:: Unsuccessful Call (1st Attempt) Unsuccessful Call (1st Attempt) Date: 05/13/23  Attempted to reach the patient regarding the most recent Inpatient/ED visit.  Follow Up Plan: Additional outreach attempts will be made to reach the patient to complete the Transitions of Care (Post Inpatient/ED visit) call.   Signature Karena Addison, LPN [facility] Nurse Health Advisor Direct Dial [phone]

## 2023-05-17 NOTE — Transitions of Care (Post Inpatient/ED Visit) (Signed)
   05/17/2023  Name: [name] WHALIN, MD MRN: [id number] DOB: Jun 29, 1929  Today's TOC FU Call Status: Today's TOC FU Call Status:: Unsuccessful Call (1st Attempt) Unsuccessful Call (1st Attempt) Date: 05/13/23  Attempted to reach the patient regarding the most recent Inpatient/ED visit.  Follow Up Plan: No further outreach attempts will be made at this time. We have been unable to contact the patient. Patient already seen in office Signature Karena Addison, LPN [facility] Nurse Health Advisor Direct Dial [phone]

## 2023-05-19 ENCOUNTER — Ambulatory Visit (HOSPITAL_COMMUNITY)
Admission: RE | Admit: 2023-05-19 | Discharge: 2023-05-19 | Disposition: A | Discharge status: Home / Self Care | Source: Ambulatory Visit | Attending: Cardiology | Admitting: Cardiology

## 2023-05-19 ENCOUNTER — Encounter (HOSPITAL_COMMUNITY): Payer: Self-pay | Admitting: Cardiology

## 2023-05-19 VITALS — Wt 149.6 lb

## 2023-05-19 DIAGNOSIS — Z9079 Acquired absence of other genital organ(s): Secondary | ICD-10-CM | POA: Insufficient documentation

## 2023-05-19 DIAGNOSIS — Z7901 Long term (current) use of anticoagulants: Secondary | ICD-10-CM | POA: Insufficient documentation

## 2023-05-19 DIAGNOSIS — Z79899 Other long term (current) drug therapy: Secondary | ICD-10-CM | POA: Insufficient documentation

## 2023-05-19 DIAGNOSIS — Z8546 Personal history of malignant neoplasm of prostate: Secondary | ICD-10-CM | POA: Insufficient documentation

## 2023-05-19 DIAGNOSIS — D509 Iron deficiency anemia, unspecified: Secondary | ICD-10-CM | POA: Diagnosis not present

## 2023-05-19 DIAGNOSIS — I951 Orthostatic hypotension: Secondary | ICD-10-CM | POA: Diagnosis not present

## 2023-05-19 DIAGNOSIS — M25512 Pain in left shoulder: Secondary | ICD-10-CM | POA: Diagnosis not present

## 2023-05-19 DIAGNOSIS — I130 Hypertensive heart and chronic kidney disease with heart failure and stage 1 through stage 4 chronic kidney disease, or unspecified chronic kidney disease: Secondary | ICD-10-CM | POA: Insufficient documentation

## 2023-05-19 DIAGNOSIS — I082 Rheumatic disorders of both aortic and tricuspid valves: Secondary | ICD-10-CM | POA: Insufficient documentation

## 2023-05-19 DIAGNOSIS — E785 Hyperlipidemia, unspecified: Secondary | ICD-10-CM | POA: Diagnosis not present

## 2023-05-19 DIAGNOSIS — I25119 Atherosclerotic heart disease of native coronary artery with unspecified angina pectoris: Secondary | ICD-10-CM | POA: Diagnosis not present

## 2023-05-19 DIAGNOSIS — I480 Paroxysmal atrial fibrillation: Secondary | ICD-10-CM | POA: Diagnosis not present

## 2023-05-19 DIAGNOSIS — I498 Other specified cardiac arrhythmias: Secondary | ICD-10-CM | POA: Diagnosis not present

## 2023-05-19 DIAGNOSIS — I5032 Chronic diastolic (congestive) heart failure: Secondary | ICD-10-CM | POA: Insufficient documentation

## 2023-05-19 LAB — CBC
HCT: 29.7 % — ABNORMAL LOW (ref 39.0–52.0)
Hemoglobin: 9.5 g/dL — ABNORMAL LOW (ref 13.0–17.0)
MCH: 30.6 pg (ref 26.0–34.0)
MCHC: 32 g/dL (ref 30.0–36.0)
MCV: 95.8 fL (ref 80.0–100.0)
Platelets: 347 10*3/uL (ref 150–400)
RBC: 3.1 MIL/uL — ABNORMAL LOW (ref 4.22–5.81)
RDW: 14.4 % (ref 11.5–15.5)
WBC: 10.2 10*3/uL (ref 4.0–10.5)
nRBC: 0 % (ref 0.0–0.2)

## 2023-05-19 LAB — IRON AND TIBC
Iron: 48 ug/dL (ref 45–182)
Saturation Ratios: 18 % (ref 17.9–39.5)
TIBC: 269 ug/dL (ref 250–450)
UIBC: 221 ug/dL

## 2023-05-19 LAB — FERRITIN: Ferritin: 157 ng/mL (ref 24–336)

## 2023-05-19 MED ORDER — MIDODRINE HCL 10 MG PO TABS: 15.0000 mg | ORAL_TABLET | Freq: Three times a day (TID) | ORAL | 5 refills | Status: DC

## 2023-05-19 MED ORDER — FUROSEMIDE 20 MG PO TABS: 20.0000 mg | ORAL_TABLET | ORAL | 3 refills | Status: DC

## 2023-05-19 NOTE — Progress Notes (Signed)
 Patient ID: [name] Heath, MD, male   DOB: 1930/01/30, 89 y.o.   MRN: [id number] PCP: Kristian Covey, MD Cardiology: Dr. Shirlee Latch  Chief Complaint: CHF, orthostatic hypotension.   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  He had an RV infarct with chronic total occlusion of a small nondominant RCA in the 1980s.  He had a drug-eluting stent to the CFX in 11/10. He is very symptomatic with atrial fibrillation episodes. He has had a chronic pattern of mild shortness of breath and mild left shoulder pain/chest aching when walking up a hill (x 20 years).   He can walk 3 miles on flat ground without problems.  He golfs 3-4 times a week.  He walks for 20-30 minutes on a treadmill several times a week.   Echo in 7/16 showed normal LV systolic function, EF 60-65% with moderate diastolic dysfunction.  Moderate TR with PA systolic pressure 65 mmHg.  Normal RV size and systolic function.  Given volume overload on exam and abnormal echo, he was started on low dose Lasix in the past.    He was started on Multaq but continued to have episodes of atrial fibrillation.  This was stopped, and he was admitted for Tikosyn loading.  After he went home, he developed left facial droop and left arm weakness.  He was found to have a small right parietal cortical infarct with hemorrhagic conversion.  His symptoms resolved quickly. INR was subtherapeutic at admission.  Warfarin was stopped and he was put on apixaban.   In 9/17, he was admitted with LLL PNA.  During the admission, he had a couple of relatively short runs of atrial fibrillation.  He developed more frequent episodes of atrial fibrillation at home and was started on Tikosyn.   In 5/19, he had bilateral orchiectomy for metastatic prostate cancer.  His PSA has come down considerably.   In 1/20, he had influenza that progressed to PNA.  He was hospitalized with  transient atrial fibrillation.    In 11/20, he was admitted with an E coli UTI and urosepsis with hypotension and AKI.  He was initially fluid-resuscitated and later required diuresis.  He was in atrial fibrillation while in the hospital.   Patient was admitted in 1/23 with influenza A as well as Strep pneumoniae PNA.  He was treated with oseltamivir and antibiotics. While in the hospital, he went into atrial fibrillation with controlled rate.    He went into atrial fibrillation in 12/24 around the time of his sister's death, may have been triggered by stress. He went back into NSR spontaneously.   Patient was admitted in 3/25 with presyncope.  He was found to be markedly orthostatic and midodrine was started.  Echo showed EF 60-65%, normal RV function, PASP 62 mmHg, mild MR, severe TR, moderate aortic stenosis with mean gradient 15 mmHg, and AVA 1.21 cm^2, IVC was not dilated.  Patient was seen in the ER again on 05/12/23 with orthostatic symptoms.   Patient returns for followup of atrial fibrillation, CHF, and orthostatic hypotension.  He is in NSR today. Weight up 5 lbs. Lightheadedness is subjectively improved on midodrine, but still orthostatic when checked today (dropped 20 points systolic sitting to standing).  He is short of breath dressing and doing moderate activities.  He does ok walking around the house.  No chest pain.  No orthopnea/PND.  No falls.  He is using  a walker.    ECG (personally reviewed): NSR, QTc 445 msec, nonspecific T wave flattening   Labs (6/11): LDL 63, HDL 35, K 5.5, creatinine 1.1 Labs (6/12): LDL 58, HDL 53, TSH normal, K 4.7, creatinine 1 Labs (12/12): BNP 79 Labs (7/14): K 5, creatinine 1.1, LDL 48, HDL 34 Labs (8/15): LDL 48, HDL 33, TSH normal, K 5.4, creatinine 1.2 Labs (6/16): K 5.1, creatinine 1.27, HCT 42.5 Labs (10/16): LDL 37, HDL 32 Labs (11/16): K 4.4, creatinine 1.36, BNP 103 Labs  (2/17): K 4.5, creatinine 1.38, Mg 2.0, LDL 32 Labs (3/17): HCT  41.7 Labs (5/17): K 4.2, creatinine 1.17, Mg 2.1 Labs (9/17): K 4.2, creatinine 1.16, HCT 37.3 Labs (11/17): hgb 13.3, LDL 40 Labs (5/18): K 4.5, creatinine 1.25 Labs (5/19): K 5.5, creatinine 1.16 Labs (2/20): K 4, creatinine 1.24, hgb 12.2 Labs (11/20): K 3.4, creatinine 1.34, hgb 10.3, LDL 49 Labs (12/20): K 4.5, creatinine 1.2 Labs (2/21): K 3.9, creatinine 1.5, TSH 4.5 (mildly elevated) Labs (5/21): TSH normal, K 5, creatinine 1.27 Labs (9/21): K 4.1, creatinine 1.22, LDL 40, TSH normal Labs (4/22): K 4.2, creatinine 1.11 Labs (8/22): hgb 12, LDL 36 Labs (10/22): K 4.5, creatinine 1.38 Labs (1/23): K 4.6, creatinine 1.15, hgb 11.4 Labs (4/23): LDL 37 Labs (7/23): hgb 7.8, K 4.5, creatinine 1.14 Labs (8/23): hgb 9 Labs (1/24): LDL 29 Labs (3/24): K 3.6, creatinine 0.96 Labs (9/24): K 4.5, 1.03, hgb 10.9 Labs (12/24): hgb 11.5, K 4.5, creatinine 1.26, Mg 2.0, BNP 284, TSH normal Labs (1/25): LDL 32 Labs (4/25): K 4.8, creatinine 1.21, hgb 8.1  PMH: 1. CAD: RV infarct in the 1980s.  Chronic total occlusion of a small nondominant RCA.  11/10 PCI to CFX with Xience DES.  EF 60% on LV-gram at that time. ETT (11/14) with 9'07" exercise, no significant ST depression.  2.  Paroxysmal atrial fibrillation: Multaq was ineffective. Now on Tikosyn.  3.  Hyperlipidemia 4. GERD 5. HTN 6. Asthma 7. Chronic diastolic CHF: Echo (6/12): EF 16-10%, mild LV hypertrophy, no regional WMAs, mild AI, mild MR, mild-moderate biatrial enlargement, PA systolic pressure 51 mmHg. Echo (7/16) with EF 60-65%, moderate diastolic dysfunction, normal RV size and systolic function, mild-moderate MR, moderate TR, moderate biatrial enlargement, PA systolic pressure 65 mmHg.  - Echo (5/18): EF 55-60%, mild AI, mild MR, PASP 60 mmHg, normal RV size and systolic function.  - Echo (8/19): EF 60-65%, normal RV size and systolic function, severe TR with PASP 60 mmHg.  - Echo (4/25): EF 60-65%, normal RV function, PASP 62  mmHg, mild MR, severe TR, moderate aortic stenosis with mean gradient 15 mmHg, and AVA 1.21 cm^2, IVC was not dilated.  8. CKD 9. CVA: Right parietal cortical CVA with peri-infarct hemorrhagic conversion.  CTA showed right M2 branch flow gap.   10. Prostate cancer: Metastatic.  - Bilateral orchiectomy in 5/19.  11. Aortic stenosis: Moderate on 3/25 echo.   SH: Nonsmoker.  Married. Retired Marine scientist, lives in [place].  2 daughters.  Originally from [place].   FH: Brother with CABG.   ROS: All systems reviewed and negative except as per HPI.    Current Outpatient Medications  Medication Sig Dispense Refill   apixaban (ELIQUIS) 5 MG TABS tablet Take 1 tablet (5 mg total) by mouth 2 (two) times daily. 180 tablet 3   atorvastatin (LIPITOR) 20 MG tablet TAKE 1 TABLET BY MOUTH EVERY DAY 90 tablet 3   buPROPion (WELLBUTRIN SR) 150 MG 12 hr tablet  TAKE 1 TABLET(150 MG) BY MOUTH DAILY 90 tablet 3   cetirizine (ZYRTEC) 10 MG tablet Take 10 mg by mouth daily.     Cholecalciferol (VITAMIN D-3 PO) Take 1 capsule by mouth at bedtime.     denosumab (PROLIA) 60 MG/ML SOSY injection Inject 60 mg into the skin every 6 (six) months.     dofetilide (TIKOSYN) 250 MCG capsule TAKE 1 CAPSULE(250 MCG) BY MOUTH TWICE DAILY 180 capsule 1   FLUoxetine (PROZAC) 10 MG capsule Take 1 capsule (10 mg total) by mouth daily. 90 capsule 3   Fluticasone Furoate (ARNUITY ELLIPTA) 200 MCG/ACT AEPB Inhale 1 puff into the lungs daily. 60 each 5   furosemide (LASIX) 20 MG tablet Take 1 tablet (20 mg total) by mouth every other day. 30 tablet 3   Lactase (LACTAID PO) Take 1 tablet by mouth 3 (three) times daily with meals as needed (lactose intolerance).     loperamide (IMODIUM) 2 MG capsule Take 2 capsules (4 mg total) by mouth 3 (three) times daily as needed for diarrhea or loose stools. 30 capsule 0   LORazepam (ATIVAN) 1 MG tablet TAKE 1/2 TO 1 TABLET(0.5 TO 1 MG) BY MOUTH AT BEDTIME AS NEEDED 90 tablet 1   omeprazole  (PRILOSEC OTC) 20 MG tablet Take 1 tablet (20 mg total) by mouth daily. 100 tablet 4   Polyethyl Glycol-Propyl Glycol (SYSTANE OP) Place 1 drop into both eyes 4 (four) times daily as needed (dry eye).     tamsulosin (FLOMAX) 0.4 MG CAPS capsule TAKE 1 CAPSULE(0.4 MG) BY MOUTH DAILY 90 capsule 1   traMADol (ULTRAM) 50 MG tablet Take 1 tablet (50 mg total) by mouth every 6 (six) hours as needed. 30 tablet 1   midodrine (PROAMATINE) 10 MG tablet Take 1.5 tablets (15 mg total) by mouth 3 (three) times daily with meals. 135 tablet 5   saccharomyces boulardii (FLORASTOR) 250 MG capsule Take 1 capsule (250 mg total) by mouth 2 (two) times daily for 14 days. (Patient not taking: Reported on 05/19/2023) 28 capsule 0   No current facility-administered medications for this encounter.    Wt 67.9 kg (149 lb 9.6 oz)   SpO2 99%   BMI 21.47 kg/m  General: NAD Neck: JVP 8-9 cm, no thyromegaly or thyroid nodule.  Lungs: Clear to auscultation bilaterally with normal respiratory effort. CV: Nondisplaced PMI.  Heart regular S1/S2, no S3/S4, 2/6 SEM RUSB with clear S2.  1+ ankle edema.  No carotid bruit.  Normal pedal pulses.  Abdomen: Soft, nontender, no hepatosplenomegaly, no distention.  Skin: Intact without lesions or rashes.  Neurologic: Alert and oriented x 3.  Psych: Normal affect. Extremities: No clubbing or cyanosis.  HEENT: Normal.   Assessment/Plan: 1. CAD: He has had chronic stable angina walking up a steep hill (manifests as left shoulder pain).  Has not had this recently but not as active.  No change in pattern x 20 years.   - Continue medical management of angina, no indication for stress test or cath.  - He is on apixaban without ASA given stable CAD.   - Continue statin, good LDL in 1/25.   2. Hyperlipidemia: He is on atorvastatin 20 mg daily.  3. Chronic diastolic CHF:  Most recent echo in 3/25 showed EF 60-65%, normal RV function, PASP 62 mmHg, mild MR, severe TR, moderate aortic stenosis  with mean gradient 15 mmHg, and AVA 1.21 cm^2, IVC was not dilated.   NYHA class III, worse recently.  He  is mildly volume overloaded on exam and also deconditioned.  - Start Lasix 20 mg every other day, careful with orthostasis. BMET/BNP today, BMET in 10 days.   4. Atrial fibrillation: Paroxysmal.  Episodes are quite symptomatic (fatigue and dizziness) and often triggered by some form of physical or emotional stress, he feels "bad" with atrial fibrillation even when he's rate-controlled. He had frequent breakthrough on Multaq but and is now on Tikosyn.  QTc ok on today's ECG.  He is now off diltiazem CD and atenolol due to low BP and orthostasis.  He is in NSR today.  - Continue Tikosyn, QTc ok on today's ECG.  BMET today.    - Continue Eliquis. CBC today.   5. Tricuspid regurgitation: Severe TR.  Not candidate for surgical TVR and has not been interested in referral for percutaneous repair.  6. Anemia: H/o Fe deficiency, has had IV Fe in past.  - Check Fe studies and CBC today.  7. Orthostatic hypotension: He has had this for a while, but worse recently and was admitted in 3/25 with orthostatic hypotension and presyncope.  He has been started on midodrine 10 mg tid.  He is still orthostatic today with 20 point drop in SBP with standing.  - Continue to wear compression stockings. - Cannot tolerate abdominal binder.  - Increase midodrine to 15 mg tid.   Followup in 1 months.    I spent 32 minutes reviewing records, interviewing/examining patient, and managing orders.    Marca Ancona 05/19/2023

## 2023-05-19 NOTE — Patient Instructions (Addendum)
 Medication Changes:  TAKE LASIX (FUROSEMIDE) 20MG  EVERY OTHER DAY   INCREASE MIDODRINE TO 15MG  THREE TIMES DAILY   Lab Work:  Labs done today, your results will be available in MyChart, we will contact you for abnormal readings.  PLEASE RETURN FOR LABS IN 10 DAYS AS SCHEDULED   Follow-Up in: 1 MONTH AS SCHEDULED WITH DR. Shirlee Latch   At the [facility], you and your health needs are our priority. We have a designated team specialized in the treatment of Heart Failure. This Care Team includes your primary Heart Failure Specialized Cardiologist (physician), Advanced Practice Providers (APPs- Physician Assistants and Nurse Practitioners), and Pharmacist who all work together to provide you with the care you need, when you need it.   You may see any of the following providers on your designated Care Team at your next follow up:  Dr. Arvilla Meres Dr. Marca Ancona Dr. Dorthula Nettles Dr. Theresia Bough Tonye Becket, NP Robbie Lis, [facility] [place], [place] Brynda Peon, NP [place] Lee, NP Karle Plumber, PharmD   Please be sure to bring in all your medications bottles to every appointment.   Need to Contact us:  If you have any questions or concerns before your next appointment please send [place] a message through [place] or call our office at [phone].    TO LEAVE A MESSAGE FOR THE NURSE SELECT OPTION 2, PLEASE LEAVE A MESSAGE INCLUDING: YOUR NAME DATE OF BIRTH CALL BACK NUMBER REASON FOR CALL**this is important as we prioritize the call backs  YOU WILL RECEIVE A CALL BACK THE SAME DAY AS LONG AS YOU CALL BEFORE 4:00 PM

## 2023-05-20 DIAGNOSIS — D631 Anemia in chronic kidney disease: Secondary | ICD-10-CM | POA: Diagnosis not present

## 2023-05-20 DIAGNOSIS — J309 Allergic rhinitis, unspecified: Secondary | ICD-10-CM | POA: Diagnosis not present

## 2023-05-20 DIAGNOSIS — N401 Enlarged prostate with lower urinary tract symptoms: Secondary | ICD-10-CM | POA: Diagnosis not present

## 2023-05-20 DIAGNOSIS — I5032 Chronic diastolic (congestive) heart failure: Secondary | ICD-10-CM | POA: Diagnosis not present

## 2023-05-20 DIAGNOSIS — R351 Nocturia: Secondary | ICD-10-CM | POA: Diagnosis not present

## 2023-05-20 DIAGNOSIS — I071 Rheumatic tricuspid insufficiency: Secondary | ICD-10-CM | POA: Diagnosis not present

## 2023-05-20 DIAGNOSIS — J4530 Mild persistent asthma, uncomplicated: Secondary | ICD-10-CM | POA: Diagnosis not present

## 2023-05-20 DIAGNOSIS — N179 Acute kidney failure, unspecified: Secondary | ICD-10-CM | POA: Diagnosis not present

## 2023-05-20 DIAGNOSIS — E785 Hyperlipidemia, unspecified: Secondary | ICD-10-CM | POA: Diagnosis not present

## 2023-05-20 DIAGNOSIS — I951 Orthostatic hypotension: Secondary | ICD-10-CM | POA: Diagnosis not present

## 2023-05-20 DIAGNOSIS — R197 Diarrhea, unspecified: Secondary | ICD-10-CM | POA: Diagnosis not present

## 2023-05-20 DIAGNOSIS — M159 Polyosteoarthritis, unspecified: Secondary | ICD-10-CM | POA: Diagnosis not present

## 2023-05-20 DIAGNOSIS — I2510 Atherosclerotic heart disease of native coronary artery without angina pectoris: Secondary | ICD-10-CM | POA: Diagnosis not present

## 2023-05-20 DIAGNOSIS — I272 Pulmonary hypertension, unspecified: Secondary | ICD-10-CM | POA: Diagnosis not present

## 2023-05-20 DIAGNOSIS — K219 Gastro-esophageal reflux disease without esophagitis: Secondary | ICD-10-CM | POA: Diagnosis not present

## 2023-05-20 DIAGNOSIS — I130 Hypertensive heart and chronic kidney disease with heart failure and stage 1 through stage 4 chronic kidney disease, or unspecified chronic kidney disease: Secondary | ICD-10-CM | POA: Diagnosis not present

## 2023-05-20 DIAGNOSIS — F32A Depression, unspecified: Secondary | ICD-10-CM | POA: Diagnosis not present

## 2023-05-20 DIAGNOSIS — I480 Paroxysmal atrial fibrillation: Secondary | ICD-10-CM | POA: Diagnosis not present

## 2023-05-20 DIAGNOSIS — Z9841 Cataract extraction status, right eye: Secondary | ICD-10-CM | POA: Diagnosis not present

## 2023-05-20 DIAGNOSIS — N1831 Chronic kidney disease, stage 3a: Secondary | ICD-10-CM | POA: Diagnosis not present

## 2023-05-20 DIAGNOSIS — K5790 Diverticulosis of intestine, part unspecified, without perforation or abscess without bleeding: Secondary | ICD-10-CM | POA: Diagnosis not present

## 2023-05-20 DIAGNOSIS — Z8673 Personal history of transient ischemic attack (TIA), and cerebral infarction without residual deficits: Secondary | ICD-10-CM | POA: Diagnosis not present

## 2023-05-20 DIAGNOSIS — I350 Nonrheumatic aortic (valve) stenosis: Secondary | ICD-10-CM | POA: Diagnosis not present

## 2023-05-20 DIAGNOSIS — F419 Anxiety disorder, unspecified: Secondary | ICD-10-CM | POA: Diagnosis not present

## 2023-05-30 ENCOUNTER — Ambulatory Visit (HOSPITAL_COMMUNITY)
Admission: RE | Admit: 2023-05-30 | Discharge: 2023-05-30 | Disposition: A | Discharge status: Home / Self Care | Source: Ambulatory Visit | Attending: Cardiology

## 2023-05-30 DIAGNOSIS — I5032 Chronic diastolic (congestive) heart failure: Secondary | ICD-10-CM | POA: Diagnosis not present

## 2023-05-30 LAB — BASIC METABOLIC PANEL WITH GFR
Anion gap: 7 (ref 5–15)
BUN: 24 mg/dL — ABNORMAL HIGH (ref 8–23)
CO2: 23 mmol/L (ref 22–32)
Calcium: 9 mg/dL (ref 8.9–10.3)
Chloride: 102 mmol/L (ref 98–111)
Creatinine, Ser: 1.17 mg/dL (ref 0.61–1.24)
GFR, Estimated: 59 mL/min — ABNORMAL LOW (ref >60)
Glucose, Bld: 100 mg/dL — ABNORMAL HIGH (ref 70–99)
Potassium: 4.7 mmol/L (ref 3.5–5.1)
Sodium: 132 mmol/L — ABNORMAL LOW (ref 135–145)

## 2023-05-30 LAB — BRAIN NATRIURETIC PEPTIDE: B Natriuretic Peptide: 199.7 pg/mL — ABNORMAL HIGH (ref 0.0–100.0)

## 2023-06-08 DIAGNOSIS — C61 Malignant neoplasm of prostate: Secondary | ICD-10-CM | POA: Diagnosis not present

## 2023-06-09 ENCOUNTER — Other Ambulatory Visit (HOSPITAL_COMMUNITY): Payer: Self-pay

## 2023-06-09 MED ORDER — MIDODRINE HCL 10 MG PO TABS: 15.0000 mg | ORAL_TABLET | Freq: Three times a day (TID) | ORAL | 5 refills | Status: DC

## 2023-06-15 DIAGNOSIS — R9721 Rising PSA following treatment for malignant neoplasm of prostate: Secondary | ICD-10-CM | POA: Diagnosis not present

## 2023-06-15 DIAGNOSIS — N401 Enlarged prostate with lower urinary tract symptoms: Secondary | ICD-10-CM | POA: Diagnosis not present

## 2023-06-15 DIAGNOSIS — C61 Malignant neoplasm of prostate: Secondary | ICD-10-CM | POA: Diagnosis not present

## 2023-06-15 DIAGNOSIS — R351 Nocturia: Secondary | ICD-10-CM | POA: Diagnosis not present

## 2023-06-23 ENCOUNTER — Ambulatory Visit: Admitting: Family Medicine

## 2023-06-23 ENCOUNTER — Encounter: Payer: Self-pay | Admitting: Family Medicine

## 2023-06-23 DIAGNOSIS — Z0000 Encounter for general adult medical examination without abnormal findings: Secondary | ICD-10-CM | POA: Diagnosis not present

## 2023-06-23 NOTE — Progress Notes (Signed)
 Patient unable to obtain vital signs due to telehealth visit

## 2023-06-23 NOTE — Progress Notes (Signed)
 PATIENT CHECK-IN and HEALTH RISK ASSESSMENT QUESTIONNAIRE:  -completed by phone/video for upcoming Medicare Preventive Visit   Pre-Visit Check-in: 1)Vitals (height, wt, BP, etc) - record in vitals section for visit on day of visit Request home vitals (wt, BP, etc.) and enter into vitals, THEN update Vital Signs SmartPhrase below at the top of the HPI. See below.  2)Review and Update Medications, Allergies PMH, Surgeries, Social history in Epic 3)Hospitalizations in the last year with date/reason? Yes - Fall  4)Review and Update Care Team (patient's specialists) in Epic 5) Complete PHQ9 in Epic  6) Complete Fall Screening in Epic 7)Review all Health Maintenance Due and order under PCP if not done.  Medicare Wellness Patient Questionnaire:  Answer theses question about your habits: How often do you have a drink containing alcohol ?n How many drinks containing alcohol  do you have on a typical day when you are drinking?na How often do you have six or more drinks on one occasion?N/A Have you ever smoked?No Quit date if applicable? N/A  How many packs a day do/did you smoke? N/A Do you use smokeless tobacco?No Do you use an illicit drugs? No On average, how many days per week do you engage in moderate to strenuous exercise (like a brisk walk)?2 days , has personal trainer 2 days per week and and is doing PT and OT another 1-2 days  On average, how many minutes do you engage in exercise at this level?60 mins  Reports wife cooks and they eat well, they eat balanced diet Typical breakfast: Varies  Typical lunch: Varies  Typical dinner: Varies  Typical snacks: Ice cream   Beverages: Water   Answer theses question about your everyday activities: Can you perform most household chores? Yes  Are you deaf or have significant trouble hearing? Yes  Do you feel that you have a problem with memory? No Do you feel safe at home?Yes  Last dentist visit? 2 weeks ago  8. Do you have any difficulty  performing your everyday activities? Occasionally  Are you having any difficulty walking, taking medications on your own, and or difficulty managing daily home needs? no Do you have difficulty walking or climbing stairs? Depends  Do you have difficulty dressing or bathing? no Do you have difficulty doing errands alone such as visiting a doctor's office or shopping? Yes  Do you currently have any difficulty preparing food and eating?no Do you currently have any difficulty using the toilet?no Do you have any difficulty managing your finances?no Do you have any difficulties with housekeeping of managing your housekeeping?no   Do you have Advanced Directives in place (Living Will, Healthcare Power or Attorney)?  Yes    Last eye Exam and location? [facility] ophthalmology 12/24   Do you currently use prescribed or non-prescribed narcotic or opioid pain medications? Yes- some tramadol  from Dr. Darren Em  Do you have a history or close family history of breast, ovarian, tubal or peritoneal cancer or a family member with BRCA (breast cancer susceptibility 1 and 2) gene mutations? no  Nurse/Assistant Credentials/time stamp: MG 3:26    ----------------------------------------------------------------------------------------------------------------------------------------------------------------------------------------------------------------------  Because this visit was a virtual/telehealth visit, some criteria may be missing or patient reported. Any vitals not documented were not able to be obtained and vitals that have been documented are patient reported.    MEDICARE ANNUAL PREVENTIVE CARE VISIT WITH PROVIDER (Welcome to Medicare, initial annual wellness or annual wellness exam)  Virtual Visit via Phone Note  I connected with [name], MD on 06/23/23  by phone and verified that I am speaking with the correct person using two identifiers.prefers phone.  Location patient:  home Location provider:work or home office Persons participating in the virtual visit: patient, provider  Concerns and/or follow up today: reports is doing well. Recovered from fall and feeling better. Orthostatic BP improved since on midodrine .    See HM section in Epic for other details of completed HM.    ROS: negative for report of fevers, unintentional weight loss, vision changes, vision loss, hearing loss or change, chest pain, sob, hemoptysis, melena, hematochezia, hematuria, falls, bleeding or bruising, thoughts of suicide or self harm, memory loss  Patient-completed extensive health risk assessment - reviewed and discussed with the patient: See Health Risk Assessment completed with patient prior to the visit either above or in recent phone note. This was reviewed in detailed with the patient today and appropriate recommendations, orders and referrals were placed as needed per Summary below and patient instructions.   Review of Medical History: -PMH, PSH, Family History and current specialty and care providers reviewed and updated and listed below   Patient Care Team: Marquetta Sit, MD as PCP - General (Family Medicine) Trent Frizzle, MD as Consulting Physician (Urology) Tammie Fall, MD as Consulting Physician (Cardiology) Oris Birmingham, MD as Consulting Physician (Ophthalmology)   Past Medical History:  Diagnosis Date   Acute blood loss anemia 08/27/2021   Acute hemorrhagic infarction of brain [facility])    Allergic rhinitis    Anxiety    Asthma    Atrial fibrillation (HCC)    CAD (coronary artery disease)    CHF (congestive heart failure) (HCC)    Depression    Diverticulosis 03/16/1995   DJD (degenerative joint disease)    "most joints" (03/11/2015)   GERD (gastroesophageal reflux disease) 12/30/2000   Heart attack (HCC) 1984   Hiatal hernia 12/30/2000   History of blood transfusion 1988   "related to GI bleeding OR"   History of duodenal ulcer  10/15/1986   Hyperlipidemia    Hypertension    ICH (intracerebral hemorrhage) (HCC)    Sepsis (HCC) 12/2018   Sinus bradycardia    Stroke [facility])     Past Surgical History:  Procedure Laterality Date   CARDIAC CATHETERIZATION     CATARACT EXTRACTION W/ INTRAOCULAR LENS  IMPLANT, BILATERAL Bilateral ?2013   CHOLECYSTECTOMY OPEN  1989   CORONARY ANGIOPLASTY WITH STENT PLACEMENT  2010   hospital ccu  1984   heart attack, hypoplastic right coronary   INGUINAL HERNIA REPAIR Right ~ 1990   NASAL SEPTUM SURGERY  1972   ORCHIECTOMY Bilateral 06/10/2017   Procedure: BILATERAL ORCHIECTOMY;  Surgeon: Trent Frizzle, MD;  Location: [facility] ORS;  Service: Urology;  Laterality: Bilateral;  MAC ANESTHESIA AND LOCAL   TONSILLECTOMY     TOTAL KNEE ARTHROPLASTY Bilateral 1998-2004   "right-left"   VAGOTOMY AND PYLOROPLASTY  1988   "bleeding duodenal ulcer"    Social History   Socioeconomic History   Marital status: Married    Spouse name: Not on file   Number of children: 2   Years of education: Not on file   Highest education level: Doctorate  Occupational History   Occupation: Therapist, nutritional: RETIRED    Comment: radiologist  Tobacco Use   Smoking status: Never   Smokeless tobacco: Never  Vaping Use   Vaping status: Never Used  Substance and Sexual Activity   Alcohol  use: No   Drug use: No   Sexual activity:  Not Currently  Other Topics Concern   Not on file  Social History Narrative   Married. Regular exercise - yes.       03/26/2019: Married to Charity fundraiser, lives in 3 story home, although lives on first level mostly. Has two grown children.      Enjoys golfing; drives to [place]  with wife every winter for a few days, watches sports, following Corporate investment banker, reads about WWII,    Social Drivers of Corporate investment banker Strain: Low Risk  (06/23/2023)   Overall Financial Resource Strain (CARDIA)    Difficulty of Paying Living Expenses: Not hard at all  Food Insecurity: No Food  Insecurity (05/10/2023)   Hunger Vital Sign    Worried About Running Out of Food in the Last Year: Never true    Ran Out of Food in the Last Year: Never true  Transportation Needs: No Transportation Needs (05/10/2023)   PRAPARE - Administrator, Civil Service (Medical): No    Lack of Transportation (Non-Medical): No  Physical Activity: Sufficiently Active (06/23/2023)   Exercise Vital Sign    Days of Exercise per Week: 4 days    Minutes of Exercise per Session: 60 min  Stress: No Stress Concern Present (06/23/2023)   [employer] of Occupational Health - Occupational Stress Questionnaire    Feeling of Stress : Not at all  Social Connections: Moderately Isolated (05/06/2023)   Social Connection and Isolation Panel [NHANES]    Frequency of Communication with Friends and Family: More than three times a week    Frequency of Social Gatherings with Friends and Family: More than three times a week    Attends Religious Services: Never    Database administrator or Organizations: No    Attends Banker Meetings: Never    Marital Status: Married  Catering manager Violence: Not At Risk (05/10/2023)   Humiliation, Afraid, Rape, and Kick questionnaire    Fear of Current or Ex-Partner: No    Emotionally Abused: No    Physically Abused: No    Sexually Abused: No    Family History  Problem Relation Age of Onset   Coronary artery disease Mother        deceased   Deep vein thrombosis Sister    Coronary artery disease Brother    Asthma Brother    Colon cancer Neg Hx     Current Outpatient Medications on File Prior to Visit  Medication Sig Dispense Refill   apixaban  (ELIQUIS ) 5 MG TABS tablet Take 1 tablet (5 mg total) by mouth 2 (two) times daily. 180 tablet 3   atorvastatin  (LIPITOR) 20 MG tablet TAKE 1 TABLET BY MOUTH EVERY DAY 90 tablet 3   buPROPion  (WELLBUTRIN  SR) 150 MG 12 hr tablet TAKE 1 TABLET(150 MG) BY MOUTH DAILY 90 tablet 3   cetirizine (ZYRTEC) 10 MG tablet  Take 10 mg by mouth daily.     Cholecalciferol  (VITAMIN D -3 PO) Take 1 capsule by mouth at bedtime.     denosumab  (PROLIA ) 60 MG/ML SOSY injection Inject 60 mg into the skin every 6 (six) months.     dofetilide  (TIKOSYN ) 250 MCG capsule TAKE 1 CAPSULE(250 MCG) BY MOUTH TWICE DAILY 180 capsule 1   FLUoxetine  (PROZAC ) 10 MG capsule Take 1 capsule (10 mg total) by mouth daily. 90 capsule 3   Fluticasone  Furoate (ARNUITY ELLIPTA ) 200 MCG/ACT AEPB Inhale 1 puff into the lungs daily. 60 each 5   furosemide  (LASIX ) 20 MG tablet Take  1 tablet (20 mg total) by mouth every other day. 30 tablet 3   Lactase (LACTAID PO) Take 1 tablet by mouth 3 (three) times daily with meals as needed (lactose intolerance).     loperamide  (IMODIUM ) 2 MG capsule Take 2 capsules (4 mg total) by mouth 3 (three) times daily as needed for diarrhea or loose stools. 30 capsule 0   LORazepam  (ATIVAN ) 1 MG tablet TAKE 1/2 TO 1 TABLET(0.5 TO 1 MG) BY MOUTH AT BEDTIME AS NEEDED 90 tablet 1   midodrine  (PROAMATINE ) 10 MG tablet Take 1.5 tablets (15 mg total) by mouth 3 (three) times daily with meals. 135 tablet 5   omeprazole  (PRILOSEC  OTC) 20 MG tablet Take 1 tablet (20 mg total) by mouth daily. 100 tablet 4   Polyethyl Glycol-Propyl Glycol (SYSTANE OP) Place 1 drop into both eyes 4 (four) times daily as needed (dry eye).     tamsulosin  (FLOMAX ) 0.4 MG CAPS capsule TAKE 1 CAPSULE(0.4 MG) BY MOUTH DAILY 90 capsule 1   traMADol  (ULTRAM ) 50 MG tablet Take 1 tablet (50 mg total) by mouth every 6 (six) hours as needed. 30 tablet 1   No current facility-administered medications on file prior to visit.    Allergies  Allergen Reactions   Tylenol  [Acetaminophen ] Rash   Lactose Intolerance (Gi) Diarrhea and Nausea Only   Levaquin [Levofloxacin]     Triggered A-fib    Advil [Ibuprofen] Nausea Only and Rash   Asa [Aspirin ] Rash       Physical Exam Vitals requested from patient and listed below if patient had equipment and was able to  obtain at home for this virtual visit: There were no vitals filed for this visit. Estimated body mass index is 21.47 kg/m as calculated from the following:   Height as of 05/06/23: 5\' 10"  (1.778 m).   Weight as of 05/19/23: 149 lb 9.6 oz (67.9 kg).  EKG (optional): deferred due to virtual visit  GENERAL: alert, oriented, no acute distress detected; full vision exam deferred due to pandemic and/or virtual encounter  PSYCH/NEURO: pleasant and cooperative, no obvious depression or anxiety, speech and thought processing grossly intact, Cognitive function grossly intact  Flowsheet Row Office Visit from [facility]/23/2023 in [facility]  PHQ-9 Total Score 0           06/23/2023    3:17 PM 05/11/2023    9:36 AM 04/20/2022   11:20 AM 04/19/2022    3:34 PM 12/16/2021   10:36 AM  Depression screen PHQ 2/9  Decreased Interest 0 0 0 0 0  Down, Depressed, Hopeless 0 0 0 0 1  PHQ - 2 Score 0 0 0 0 1  Altered sleeping 0  0  0  Tired, decreased energy 0  0  1  Change in appetite 0  0  0  Feeling bad or failure about yourself  0  0  0  Trouble concentrating 0  0  0  Moving slowly or fidgety/restless 0  0  0  Suicidal thoughts 0  0  0  PHQ-9 Score 0  0  2  Difficult doing work/chores Not difficult at all  Not difficult at all         08/28/2021    9:00 AM 12/16/2021   10:36 AM 04/19/2022    3:35 PM 05/11/2023    9:36 AM 06/23/2023    3:17 PM  Fall Risk  Falls in the past year?  0 0 1 1  Was  there an injury with Fall?  0 0 1 1  Fall Risk Category Calculator  0 0 3 3  Fall Risk Category (Retired)  Low     (RETIRED) Patient Fall Risk Level Moderate fall risk Low fall risk     Patient at Risk for Falls Due to  Other (Comment);No Fall Risks No Fall Risks No Fall Risks No Fall Risks  Fall risk Follow up  Falls evaluation completed Falls prevention discussed Falls evaluation completed Falls evaluation completed     SUMMARY AND PLAN:  Encounter for Medicare annual  wellness exam   Discussed applicable health maintenance/preventive health measures and advised and referred or ordered per patient preferences: -discussed covid vaccine recs/risks, advised can get at the pharmacy and to let us  know if he does Health Maintenance  Topic Date Due   COVID-19 Vaccine (5 - 2024-25 season) 10/10/2022   INFLUENZA VACCINE  09/09/2023   Medicare Annual Wellness (AWV)  06/22/2024   DTaP/Tdap/Td (4 - Td or Tdap) 01/12/2028   Pneumonia Vaccine 28+ Years old  Completed   Zoster Vaccines- Shingrix  Completed   HPV VACCINES  Aged Out   Meningococcal B Vaccine  Aged Out     Education and counseling on the following was provided based on the above review of health and a plan/checklist for the patient, along with additional information discussed, was provided for the patient in the patient instructions :  -he is getting PT/OT s/p falls, advised to continue exercises even after that finishes -Advised and counseled on a healthy lifestyle -Reviewed patient's current diet. Advised and counseled on a whole foods based healthy diet. A summary of a healthy diet was provided in the Patient Instructions.  -reviewed patient's current physical activity level and discussed exercise guidelines for adults. Congratulated on regular exercise and encouraged to continue, particularly with leg and calve exercises that can help with balance and orthostatic hypotension. -Advise yearly dental visits at minimum and regular eye exams    Follow up: see patient instructions   Patient Instructions  I really enjoyed getting to talk with you today! I am available on Tuesdays and Thursdays for virtual visits if you have any questions or concerns, or if I can be of any further assistance.   CHECKLIST FROM ANNUAL WELLNESS VISIT:  -Follow up (please call to schedule if not scheduled after visit):   -yearly for annual wellness visit with primary care office  Here is a list of your preventive  care/health maintenance measures and the plan for each if any are due:  PLAN For any measures below that may be due:  -can get covid vaccine at the pharmacy, if you do - let us  know so that we can update your record  Health Maintenance  Topic Date Due   COVID-19 Vaccine (5 - 2024-25 season) 10/10/2022   INFLUENZA VACCINE  09/09/2023   Medicare Annual Wellness (AWV)  06/22/2024   DTaP/Tdap/Td (4 - Td or Tdap) 01/12/2028   Pneumonia Vaccine 39+ Years old  Completed   Zoster Vaccines- Shingrix  Completed   HPV VACCINES  Aged Out   Meningococcal B Vaccine  Aged Out    -See a dentist at least yearly  -Get your eyes checked and then per your eye specialist's recommendations  -Other issues addressed today:   -I have included below further information regarding a healthy whole foods based diet, physical activity guidelines for adults, stress management and opportunities for social connections. I hope you find this information useful.   -----------------------------------------------------------------------------------------------------------------------------------------------------------------------------------------------------------------------------------------------------------  NUTRITION: -eat real food: lots of colorful vegetables (half the plate) and fruits -5-7 servings of vegetables and fruits per day (fresh or steamed is best), exp. 2 servings of vegetables with lunch and dinner and 2 servings of fruit per day. Berries and greens such as kale and collards are great choices.  -consume on a regular basis:  fresh fruits, fresh veggies, fish, nuts, seeds, healthy oils (such as olive oil, avocado oil), whole grains (make sure for bread/pasta/crackers/etc., that the first ingredient on label contains the word "whole"), legumes. -can eat small amounts of dairy and lean meat (no larger than the palm of your hand), but avoid processed meats such as ham, bacon, lunch meat, etc. -drink  water -try to avoid fast food and pre-packaged foods, processed meat, ultra processed foods/beverages (donuts, candy, etc.) -most experts advise limiting sodium to < 2300mg  per day, should limit further is any chronic conditions such as high blood pressure, heart disease, diabetes, etc. The American Heart Association advised that < 1500mg  is is ideal -try to avoid foods/beverages that contain any ingredients with names you do not recognize  -try to avoid foods/beverages  with added sugar or sweeteners/sweets  -try to avoid sweet drinks (including diet drinks): soda, juice, Gatorade, sweet tea, power drinks, diet drinks -try to avoid white rice, white bread, pasta (unless whole grain)  EXERCISE GUIDELINES FOR ADULTS: -if you wish to increase your physical activity, do so gradually and with the approval of your doctor -STOP and seek medical care immediately if you have any chest pain, chest discomfort or trouble breathing when starting or increasing exercise  -move and stretch your body, legs, feet and arms when sitting for long periods -Physical activity guidelines for optimal health in adults: -get at least 150 minutes per week of moderate exercise (can talk, but not sing); this is about 20-30 minutes of sustained activity 5-7 days per week or two 10-15 minute episodes of sustained activity 5-7 days per week -do some muscle building/resistance training/strength training at least 2 days per week  -balance exercises 3+ days per week:   Stand somewhere where you have something sturdy to hold onto if you lose balance    1) lift up on toes, then back down, start with 5x per day and work up to 20x   2) stand and lift one leg straight out to the side so that foot is a few inches of the floor, start with 5x each side and work up to 20x each side   3) stand on one foot, start with 5 seconds each side and work up to 20 seconds on each side  If you need ideas or help with getting more active:  -Silver  sneakers https://tools.silversneakers.com  -Walk with a Doc: http://www.duncan-williams.com/  -try to include resistance (weight lifting/strength building) and balance exercises twice per week: or the following link for ideas: http://castillo-powell.com/  BuyDucts.dk  STRESS MANAGEMENT: -can try meditating, or just sitting quietly with deep breathing while intentionally relaxing all parts of your body for 5 minutes daily -if you need further help with stress, anxiety or depression please follow up with your primary doctor or contact the wonderful folks at [facility]: [phone]  SOCIAL CONNECTIONS: -options in [employer] if you wish to engage in more social and exercise related activities:  -Silver sneakers https://tools.silversneakers.com  -Walk with a Doc: http://www.duncan-williams.com/  -Check out the [facility] Active Adults 50+ section on the [place] [employer] (hiking clubs, book clubs, cards and games, chess, exercise classes,  aquatic classes and much more) - see the website for details: https://www.[place].gov/departments/parks-recreation/active-adults50  -YouTube has lots of exercise videos for different ages and abilities as well  -[facility] (a variety of indoor and outdoor inperson activities for adults). [phone]. [address]  -Virtual Online Classes (a variety of topics): see seniorplanet.org or call [phone]  -consider volunteering at a school, hospice center, church, senior center or elsewhere            Maurie Southern, DO

## 2023-06-23 NOTE — Patient Instructions (Signed)
 I really enjoyed getting to talk with you today! I am available on Tuesdays and Thursdays for virtual visits if you have any questions or concerns, or if I can be of any further assistance.   CHECKLIST FROM ANNUAL WELLNESS VISIT:  -Follow up (please call to schedule if not scheduled after visit):   -yearly for annual wellness visit with primary care office  Here is a list of your preventive care/health maintenance measures and the plan for each if any are due:  PLAN For any measures below that may be due:  -can get covid vaccine at the pharmacy, if you do - let us  know so that we can update your record  Health Maintenance  Topic Date Due   COVID-19 Vaccine (5 - 2024-25 season) 10/10/2022   INFLUENZA VACCINE  09/09/2023   Medicare Annual Wellness (AWV)  06/22/2024   DTaP/Tdap/Td (4 - Td or Tdap) 01/12/2028   Pneumonia Vaccine 51+ Years old  Completed   Zoster Vaccines- Shingrix  Completed   HPV VACCINES  Aged Out   Meningococcal B Vaccine  Aged Out    -See a dentist at least yearly  -Get your eyes checked and then per your eye specialist's recommendations  -Other issues addressed today:   -I have included below further information regarding a healthy whole foods based diet, physical activity guidelines for adults, stress management and opportunities for social connections. I hope you find this information useful.   -----------------------------------------------------------------------------------------------------------------------------------------------------------------------------------------------------------------------------------------------------------    NUTRITION: -eat real food: lots of colorful vegetables (half the plate) and fruits -5-7 servings of vegetables and fruits per day (fresh or steamed is best), exp. 2 servings of vegetables with lunch and dinner and 2 servings of fruit per day. Berries and greens such as kale and collards are great choices.  -consume  on a regular basis:  fresh fruits, fresh veggies, fish, nuts, seeds, healthy oils (such as olive oil, avocado oil), whole grains (make sure for bread/pasta/crackers/etc., that the first ingredient on label contains the word "whole"), legumes. -can eat small amounts of dairy and lean meat (no larger than the palm of your hand), but avoid processed meats such as ham, bacon, lunch meat, etc. -drink water -try to avoid fast food and pre-packaged foods, processed meat, ultra processed foods/beverages (donuts, candy, etc.) -most experts advise limiting sodium to < 2300mg  per day, should limit further is any chronic conditions such as high blood pressure, heart disease, diabetes, etc. The American Heart Association advised that < 1500mg  is is ideal -try to avoid foods/beverages that contain any ingredients with names you do not recognize  -try to avoid foods/beverages  with added sugar or sweeteners/sweets  -try to avoid sweet drinks (including diet drinks): soda, juice, Gatorade, sweet tea, power drinks, diet drinks -try to avoid white rice, white bread, pasta (unless whole grain)  EXERCISE GUIDELINES FOR ADULTS: -if you wish to increase your physical activity, do so gradually and with the approval of your doctor -STOP and seek medical care immediately if you have any chest pain, chest discomfort or trouble breathing when starting or increasing exercise  -move and stretch your body, legs, feet and arms when sitting for long periods -Physical activity guidelines for optimal health in adults: -get at least 150 minutes per week of moderate exercise (can talk, but not sing); this is about 20-30 minutes of sustained activity 5-7 days per week or two 10-15 minute episodes of sustained activity 5-7 days per week -do some muscle building/resistance training/strength training at least 2 days  per week  -balance exercises 3+ days per week:   Stand somewhere where you have something sturdy to hold onto if you lose  balance    1) lift up on toes, then back down, start with 5x per day and work up to 20x   2) stand and lift one leg straight out to the side so that foot is a few inches of the floor, start with 5x each side and work up to 20x each side   3) stand on one foot, start with 5 seconds each side and work up to 20 seconds on each side  If you need ideas or help with getting more active:  -Silver sneakers https://tools.silversneakers.com  -Walk with a Doc: http://www.duncan-[email or web address]/  -try to include resistance (weight lifting/strength building) and balance exercises twice per week: or the following link for ideas: http://castillo-powell.com/  BuyDucts.dk  STRESS MANAGEMENT: -can try meditating, or just sitting quietly with deep breathing while intentionally relaxing all parts of your body for 5 minutes daily -if you need further help with stress, anxiety or depression please follow up with your primary doctor or contact the wonderful folks at [facility]: [phone]  SOCIAL CONNECTIONS: -options in [place] if you wish to engage in more social and exercise related activities:  -Silver sneakers https://tools.silversneakers.com  -Walk with a Doc: http://www.duncan-[email or web address]/  -Check out the [facility] Active Adults 50+ section on the [place] of [employer] (hiking clubs, book clubs, cards and games, chess, exercise classes, aquatic classes and much more) - see the website for details: https://www.[place].gov/departments/parks-recreation/active-adults50  -YouTube has lots of exercise videos for different ages and abilities as well  -[facility] (a variety of indoor and outdoor inperson activities for adults). [phone]. [address]  -Virtual Online Classes (a variety of topics): see seniorplanet.org or call [phone]  -consider volunteering at a  school, hospice center, church, senior center or elsewhere

## 2023-06-25 ENCOUNTER — Other Ambulatory Visit: Payer: Self-pay

## 2023-06-25 ENCOUNTER — Encounter (HOSPITAL_COMMUNITY): Payer: Self-pay

## 2023-06-25 ENCOUNTER — Emergency Department (HOSPITAL_COMMUNITY)

## 2023-06-25 ENCOUNTER — Observation Stay (HOSPITAL_COMMUNITY)
Admission: EM | Admit: 2023-06-25 | Discharge: 2023-06-28 | Disposition: A | Discharge status: Home / Self Care | Attending: Internal Medicine | Admitting: Internal Medicine

## 2023-06-25 ENCOUNTER — Ambulatory Visit (HOSPITAL_COMMUNITY)
Admission: EM | Admit: 2023-06-25 | Discharge: 2023-06-25 | Disposition: A | Discharge status: Other Acute Inpatient Hospital | Attending: Family Medicine | Admitting: Family Medicine

## 2023-06-25 DIAGNOSIS — Z96653 Presence of artificial knee joint, bilateral: Secondary | ICD-10-CM | POA: Insufficient documentation

## 2023-06-25 DIAGNOSIS — I6782 Cerebral ischemia: Secondary | ICD-10-CM | POA: Diagnosis not present

## 2023-06-25 DIAGNOSIS — W19XXXA Unspecified fall, initial encounter: Secondary | ICD-10-CM | POA: Insufficient documentation

## 2023-06-25 DIAGNOSIS — Z79899 Other long term (current) drug therapy: Secondary | ICD-10-CM | POA: Insufficient documentation

## 2023-06-25 DIAGNOSIS — D631 Anemia in chronic kidney disease: Secondary | ICD-10-CM | POA: Diagnosis not present

## 2023-06-25 DIAGNOSIS — I2510 Atherosclerotic heart disease of native coronary artery without angina pectoris: Secondary | ICD-10-CM | POA: Diagnosis not present

## 2023-06-25 DIAGNOSIS — R0989 Other specified symptoms and signs involving the circulatory and respiratory systems: Secondary | ICD-10-CM | POA: Diagnosis not present

## 2023-06-25 DIAGNOSIS — S0101XA Laceration without foreign body of scalp, initial encounter: Secondary | ICD-10-CM | POA: Diagnosis not present

## 2023-06-25 DIAGNOSIS — N1831 Chronic kidney disease, stage 3a: Secondary | ICD-10-CM | POA: Diagnosis not present

## 2023-06-25 DIAGNOSIS — S0990XA Unspecified injury of head, initial encounter: Secondary | ICD-10-CM

## 2023-06-25 DIAGNOSIS — I480 Paroxysmal atrial fibrillation: Secondary | ICD-10-CM | POA: Insufficient documentation

## 2023-06-25 DIAGNOSIS — N179 Acute kidney failure, unspecified: Secondary | ICD-10-CM | POA: Insufficient documentation

## 2023-06-25 DIAGNOSIS — I5032 Chronic diastolic (congestive) heart failure: Secondary | ICD-10-CM | POA: Diagnosis not present

## 2023-06-25 DIAGNOSIS — I350 Nonrheumatic aortic (valve) stenosis: Secondary | ICD-10-CM | POA: Insufficient documentation

## 2023-06-25 DIAGNOSIS — Z7901 Long term (current) use of anticoagulants: Secondary | ICD-10-CM | POA: Diagnosis not present

## 2023-06-25 DIAGNOSIS — I959 Hypotension, unspecified: Secondary | ICD-10-CM

## 2023-06-25 DIAGNOSIS — I517 Cardiomegaly: Secondary | ICD-10-CM | POA: Diagnosis not present

## 2023-06-25 DIAGNOSIS — I951 Orthostatic hypotension: Secondary | ICD-10-CM | POA: Diagnosis not present

## 2023-06-25 DIAGNOSIS — T148XXA Other injury of unspecified body region, initial encounter: Secondary | ICD-10-CM

## 2023-06-25 DIAGNOSIS — D649 Anemia, unspecified: Secondary | ICD-10-CM | POA: Insufficient documentation

## 2023-06-25 DIAGNOSIS — R42 Dizziness and giddiness: Secondary | ICD-10-CM | POA: Diagnosis not present

## 2023-06-25 DIAGNOSIS — M47812 Spondylosis without myelopathy or radiculopathy, cervical region: Secondary | ICD-10-CM | POA: Diagnosis not present

## 2023-06-25 DIAGNOSIS — I77819 Aortic ectasia, unspecified site: Secondary | ICD-10-CM | POA: Diagnosis not present

## 2023-06-25 DIAGNOSIS — Z955 Presence of coronary angioplasty implant and graft: Secondary | ICD-10-CM | POA: Diagnosis not present

## 2023-06-25 DIAGNOSIS — J45909 Unspecified asthma, uncomplicated: Secondary | ICD-10-CM | POA: Insufficient documentation

## 2023-06-25 DIAGNOSIS — I130 Hypertensive heart and chronic kidney disease with heart failure and stage 1 through stage 4 chronic kidney disease, or unspecified chronic kidney disease: Secondary | ICD-10-CM | POA: Diagnosis not present

## 2023-06-25 DIAGNOSIS — I509 Heart failure, unspecified: Secondary | ICD-10-CM | POA: Diagnosis not present

## 2023-06-25 LAB — COMPREHENSIVE METABOLIC PANEL WITH GFR
ALT: 12 U/L (ref 0–44)
AST: 17 U/L (ref 15–41)
Albumin: 2.9 g/dL — ABNORMAL LOW (ref 3.5–5.0)
Alkaline Phosphatase: 69 U/L (ref 38–126)
Anion gap: 9 (ref 5–15)
BUN: 27 mg/dL — ABNORMAL HIGH (ref 8–23)
CO2: 22 mmol/L (ref 22–32)
Calcium: 8.4 mg/dL — ABNORMAL LOW (ref 8.9–10.3)
Chloride: 104 mmol/L (ref 98–111)
Creatinine, Ser: 1.25 mg/dL — ABNORMAL HIGH (ref 0.61–1.24)
GFR, Estimated: 54 mL/min — ABNORMAL LOW (ref >60)
Glucose, Bld: 121 mg/dL — ABNORMAL HIGH (ref 70–99)
Potassium: 4.2 mmol/L (ref 3.5–5.1)
Sodium: 135 mmol/L (ref 135–145)
Total Bilirubin: 0.6 mg/dL (ref 0.0–1.2)
Total Protein: 5.4 g/dL — ABNORMAL LOW (ref 6.5–8.1)

## 2023-06-25 LAB — CBC WITH DIFFERENTIAL/PLATELET
Abs Immature Granulocytes: 0.05 10*3/uL (ref 0.00–0.07)
Basophils Absolute: 0.1 10*3/uL (ref 0.0–0.1)
Basophils Relative: 1 %
Eosinophils Absolute: 0.3 10*3/uL (ref 0.0–0.5)
Eosinophils Relative: 3 %
HCT: 27.8 % — ABNORMAL LOW (ref 39.0–52.0)
Hemoglobin: 9 g/dL — ABNORMAL LOW (ref 13.0–17.0)
Immature Granulocytes: 1 %
Lymphocytes Relative: 8 %
Lymphs Abs: 0.9 10*3/uL (ref 0.7–4.0)
MCH: 30.8 pg (ref 26.0–34.0)
MCHC: 32.4 g/dL (ref 30.0–36.0)
MCV: 95.2 fL (ref 80.0–100.0)
Monocytes Absolute: 1.1 10*3/uL — ABNORMAL HIGH (ref 0.1–1.0)
Monocytes Relative: 10 %
Neutro Abs: 8.4 10*3/uL — ABNORMAL HIGH (ref 1.7–7.7)
Neutrophils Relative %: 77 %
Platelets: 221 10*3/uL (ref 150–400)
RBC: 2.92 MIL/uL — ABNORMAL LOW (ref 4.22–5.81)
RDW: 14.5 % (ref 11.5–15.5)
WBC: 10.8 10*3/uL — ABNORMAL HIGH (ref 4.0–10.5)
nRBC: 0 % (ref 0.0–0.2)

## 2023-06-25 LAB — MAGNESIUM: Magnesium: 1.7 mg/dL (ref 1.7–2.4)

## 2023-06-25 LAB — TROPONIN I (HIGH SENSITIVITY)
Troponin I (High Sensitivity): 34 ng/L — ABNORMAL HIGH (ref <18)
Troponin I (High Sensitivity): 33 ng/L — ABNORMAL HIGH (ref <18)

## 2023-06-25 LAB — T4, FREE: Free T4: 1.16 ng/dL — ABNORMAL HIGH (ref 0.61–1.12)

## 2023-06-25 LAB — TSH: TSH: 2.001 u[IU]/mL (ref 0.350–4.500)

## 2023-06-25 LAB — BRAIN NATRIURETIC PEPTIDE: B Natriuretic Peptide: 346.6 pg/mL — ABNORMAL HIGH (ref 0.0–100.0)

## 2023-06-25 MED ORDER — SODIUM CHLORIDE 0.9 % IV BOLUS
1000.0000 mL | Freq: Once | INTRAVENOUS | Status: AC
  Administered 2023-06-25: 1000 mL via INTRAVENOUS

## 2023-06-25 MED ORDER — SODIUM CHLORIDE 0.9 % IV BOLUS: 1000.0000 mL | Freq: Once | INTRAVENOUS | Status: DC

## 2023-06-25 MED ORDER — FLUOXETINE HCL 10 MG PO CAPS
10.0000 mg | ORAL_CAPSULE | Freq: Every day | ORAL | Status: DC
  Administered 2023-06-26 – 2023-06-28 (×3): 10 mg via ORAL
  Filled 2023-06-25 (×3): qty 1

## 2023-06-25 MED ORDER — DOFETILIDE 250 MCG PO CAPS
250.0000 ug | ORAL_CAPSULE | Freq: Two times a day (BID) | ORAL | Status: DC
  Administered 2023-06-25 – 2023-06-28 (×6): 250 ug via ORAL
  Filled 2023-06-25 (×6): qty 1

## 2023-06-25 MED ORDER — SODIUM CHLORIDE 0.9% FLUSH
3.0000 mL | Freq: Two times a day (BID) | INTRAVENOUS | Status: DC
  Administered 2023-06-25 – 2023-06-27 (×5): 3 mL via INTRAVENOUS

## 2023-06-25 MED ORDER — TAMSULOSIN HCL 0.4 MG PO CAPS
0.4000 mg | ORAL_CAPSULE | Freq: Every day | ORAL | Status: DC
  Administered 2023-06-25 – 2023-06-28 (×4): 0.4 mg via ORAL
  Filled 2023-06-25 (×4): qty 1

## 2023-06-25 MED ORDER — ATORVASTATIN CALCIUM 10 MG PO TABS
20.0000 mg | ORAL_TABLET | Freq: Every day | ORAL | Status: DC
  Administered 2023-06-25 – 2023-06-28 (×4): 20 mg via ORAL
  Filled 2023-06-25 (×4): qty 2

## 2023-06-25 MED ORDER — MAGNESIUM SULFATE 4 GM/100ML IV SOLN
4.0000 g | Freq: Once | INTRAVENOUS | Status: AC
  Administered 2023-06-25: 4 g via INTRAVENOUS
  Filled 2023-06-25: qty 100

## 2023-06-25 MED ORDER — DOFETILIDE 250 MCG PO CAPS: 250.0000 ug | ORAL_CAPSULE | ORAL | Status: DC

## 2023-06-25 MED ORDER — MIDODRINE HCL 5 MG PO TABS
15.0000 mg | ORAL_TABLET | Freq: Three times a day (TID) | ORAL | Status: DC
  Administered 2023-06-26 – 2023-06-28 (×7): 15 mg via ORAL
  Filled 2023-06-25 (×7): qty 3

## 2023-06-25 MED ORDER — SODIUM CHLORIDE 0.9% FLUSH: 3.0000 mL | INTRAVENOUS | Status: DC | PRN

## 2023-06-25 MED ORDER — BUPROPION HCL ER (SR) 150 MG PO TB12
150.0000 mg | ORAL_TABLET | Freq: Every day | ORAL | Status: DC
  Administered 2023-06-25 – 2023-06-28 (×4): 150 mg via ORAL
  Filled 2023-06-25 (×4): qty 1

## 2023-06-25 MED ORDER — SODIUM CHLORIDE 0.9% FLUSH
3.0000 mL | Freq: Two times a day (BID) | INTRAVENOUS | Status: DC
  Administered 2023-06-25 – 2023-06-26 (×2): 3 mL via INTRAVENOUS
  Administered 2023-06-26: 10 mL via INTRAVENOUS
  Administered 2023-06-27: 3 mL via INTRAVENOUS

## 2023-06-25 NOTE — ED Notes (Signed)
 CCMD contacted

## 2023-06-25 NOTE — ED Triage Notes (Addendum)
 Pt presents to ED via EMS from urgent care for increased falls over the last few days. Pt went to urgent care for same and was sent to ED due to hypotension with BP in 70s systolic. Pt reports one fall as mechanical and another as orthostatic hypotension.

## 2023-06-25 NOTE — H&P (Signed)
 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  Patient is elderly frail and oriented alert.  Patient is a physician radiologist by profession and is familiar with his health. Patient was also admitted in March with similar presentation and hence history of dizziness and lightheadedness and is on midodrine .  ED Course: Pt in ed at bedside  is alert awake oriented afebrile. Vital signs in the ED were notable for the following:  Vitals:   06/25/23 2126 06/25/23 2208 06/25/23 2342 06/26/23 0410  BP: (!) 114/56 114/67 118/61 (!) 100/59  Pulse: 82 79 80 80  Temp: 97.7 F (36.5 C) 98 F (36.7 C) 97.7 F (36.5 C) 98.6 F (37 C)  Resp: 16 18 18 16   Height:      Weight:      SpO2: 99% 99% 99% 98%  TempSrc:   Oral   BMI (Calculated):      >>ED evaluation thus far shows: CMP shows glucose of 121 AKI 1.25 BUN of 27, normal LFTs. BNP of 346.6 troponin of 34. EKG today shows sinus rhythm at 78 with a PR of 177,.  No ST-T wave changes. CBC does show anemia with a hemoglobin of 9.0 WBC of 10.8 MCV of 95.2 and platelet counts of 221. >>While in the ED patient received the following: Medications  sodium chloride  0.9 % bolus 1,000 mL (0 mLs Intravenous Hold 06/25/23 1511)   Review of Systems  Neurological:  Positive for dizziness and weakness.   Past Medical History:  Diagnosis Date   Acute blood loss anemia 08/27/2021   Acute  hemorrhagic infarction of brain [facility])    Allergic rhinitis    Anxiety    Asthma    Atrial fibrillation (HCC)    CAD (coronary artery disease)    CHF (congestive heart failure) (HCC)    Depression    Diverticulosis 03/16/1995   DJD (degenerative joint disease)    "most joints" (03/11/2015)   GERD (gastroesophageal reflux disease) 12/30/2000   Heart attack (HCC) 1984   Hiatal hernia 12/30/2000   History of blood transfusion 1988   "related to GI bleeding OR"   History of duodenal ulcer 10/15/1986   Hyperlipidemia    Hypertension    ICH (intracerebral hemorrhage) (HCC)    Sepsis (HCC) 12/2018   Sinus bradycardia    Stroke [facility])    Past Surgical History:  Procedure Laterality Date   CARDIAC CATHETERIZATION     CATARACT EXTRACTION W/ INTRAOCULAR LENS  IMPLANT, BILATERAL Bilateral ?2013   CHOLECYSTECTOMY OPEN  1989   CORONARY ANGIOPLASTY WITH STENT PLACEMENT  2010   hospital ccu  1984   heart attack, hypoplastic right coronary   INGUINAL HERNIA REPAIR Right ~ 1990   NASAL SEPTUM SURGERY  1972   ORCHIECTOMY Bilateral 06/10/2017   Procedure: BILATERAL ORCHIECTOMY;  Surgeon: Trent Frizzle, MD;  Location: [facility] ORS;  Service: Urology;  Laterality: Bilateral;  MAC ANESTHESIA AND LOCAL  TONSILLECTOMY     TOTAL KNEE ARTHROPLASTY Bilateral 1998-2004   "right-left"   VAGOTOMY AND PYLOROPLASTY  1988   "bleeding duodenal ulcer"    reports that he has never smoked. He has never used smokeless tobacco. He reports that he does not drink alcohol  and does not use drugs. Allergies  Allergen Reactions   Tylenol  [Acetaminophen ] Rash   Lactose Intolerance (Gi) Diarrhea and Nausea Only   Levaquin [Levofloxacin]     Triggered A-fib    Advil [Ibuprofen] Nausea Only and Rash   Deland Favre ] Rash   Family History  Problem Relation Age of Onset   Coronary artery disease Mother        deceased   Deep vein thrombosis Sister    Coronary artery disease Brother    Asthma Brother    Colon cancer  Neg Hx    Prior to Admission medications   Medication Sig Start Date End Date Taking? Authorizing Provider  apixaban  (ELIQUIS ) 5 MG TABS tablet Take 1 tablet (5 mg total) by mouth 2 (two) times daily. 02/28/23   Darlis Eisenmenger, MD  atorvastatin  (LIPITOR) 20 MG tablet TAKE 1 TABLET BY MOUTH EVERY DAY 05/11/23   Burchette, Marijean Shouts, MD  buPROPion  (WELLBUTRIN  SR) 150 MG 12 hr tablet TAKE 1 TABLET(150 MG) BY MOUTH DAILY 05/11/23   Burchette, Marijean Shouts, MD  cetirizine (ZYRTEC) 10 MG tablet Take 10 mg by mouth daily.    [provider]  Cholecalciferol  (VITAMIN D -3 PO) Take 1 capsule by mouth at bedtime.    [provider]  denosumab  (PROLIA ) 60 MG/ML SOSY injection Inject 60 mg into the skin every 6 (six) months.    [provider]  dofetilide  (TIKOSYN ) 250 MCG capsule TAKE 1 CAPSULE(250 MCG) BY MOUTH TWICE DAILY 01/28/23   Darlis Eisenmenger, MD  FLUoxetine  (PROZAC ) 10 MG capsule Take 1 capsule (10 mg total) by mouth daily. 05/11/23   Burchette, Marijean Shouts, MD  Fluticasone  Furoate (ARNUITY ELLIPTA ) 200 MCG/ACT AEPB Inhale 1 puff into the lungs daily. 11/19/22   Burchette, Marijean Shouts, MD  furosemide  (LASIX ) 20 MG tablet Take 1 tablet (20 mg total) by mouth every other day. 05/19/23   Darlis Eisenmenger, MD  Lactase (LACTAID PO) Take 1 tablet by mouth 3 (three) times daily with meals as needed (lactose intolerance).    [provider]  loperamide  (IMODIUM ) 2 MG capsule Take 2 capsules (4 mg total) by mouth 3 (three) times daily as needed for diarrhea or loose stools. 05/09/23   Krishnan, Gokul, MD  LORazepam  (ATIVAN ) 1 MG tablet TAKE 1/2 TO 1 TABLET(0.5 TO 1 MG) BY MOUTH AT BEDTIME AS NEEDED 03/24/23   Burchette, Marijean Shouts, MD  midodrine  (PROAMATINE ) 10 MG tablet Take 1.5 tablets (15 mg total) by mouth 3 (three) times daily with meals. 06/09/23   Darlis Eisenmenger, MD  omeprazole  (PRILOSEC  OTC) 20 MG tablet Take 1 tablet (20 mg total) by mouth daily. 09/17/13   Marybelle Smiling, MD  Polyethyl  Glycol-Propyl Glycol (SYSTANE OP) Place 1 drop into both eyes 4 (four) times daily as needed (dry eye).    [provider]  tamsulosin  (FLOMAX ) 0.4 MG CAPS capsule TAKE 1 CAPSULE(0.4 MG) BY MOUTH DAILY 11/01/22   Burchette, Marijean Shouts, MD  traMADol  (ULTRAM ) 50 MG tablet Take 1 tablet (50 mg total) by mouth every 6 (six) hours as needed. 04/13/23   Burchette, Marijean Shouts, MD  Vitals:   06/25/23 2126 06/25/23 2208 06/25/23 2342 06/26/23 0410  BP: (!) 114/56 114/67 118/61 (!) 100/59  Pulse: 82 79 80 80  Resp: 16 18 18 16   Temp: 97.7 F (36.5 C) 98 F (36.7 C) 97.7 F (36.5 C) 98.6 F (37 C)  TempSrc:   Oral   SpO2: 99% 99% 99% 98%  Weight:      Height:       Physical Exam Vitals and nursing note reviewed.  Constitutional:      General: He is not in acute distress. HENT:     Head: Normocephalic.     Right Ear: Hearing normal.     Left Ear: Hearing normal.     Nose: No nasal deformity.     Mouth/Throat:     Lips: Pink.  Eyes:     General: Lids are normal.     Extraocular Movements: Extraocular movements intact.  Cardiovascular:     Rate and Rhythm: Normal rate and regular rhythm.     Heart sounds: Normal heart sounds.  Pulmonary:     Effort: Pulmonary effort is normal.     Breath sounds: Normal breath sounds.  Abdominal:     General: Bowel sounds are normal. There is no distension.     Palpations: Abdomen is soft. There is no mass.     Tenderness: There is no abdominal tenderness.  Musculoskeletal:     Right lower leg: No edema.     Left lower leg: No edema.  Skin:    General: Skin is warm.  Neurological:     General: No focal deficit present.     Mental Status: He is alert and oriented to person, place, and time.     Cranial Nerves: Cranial nerves 2-12 are intact.  Psychiatric:        Speech: Speech normal.     Labs on Admission: I have personally reviewed following labs and imaging  studies CBC: Recent Labs  Lab 06/25/23 1514  WBC 10.8*  NEUTROABS 8.4*  HGB 9.0*  HCT 27.8*  MCV 95.2  PLT 221   Basic Metabolic Panel: Recent Labs  Lab 06/25/23 1514 06/25/23 1741  NA 135  --   K 4.2  --   CL 104  --   CO2 22  --   GLUCOSE 121*  --   BUN 27*  --   CREATININE 1.25*  --   CALCIUM  8.4*  --   MG  --  1.7   GFR: Estimated Creatinine Clearance: 35.3 mL/min (A) (by C-G formula based on SCr of 1.25 mg/dL (H)). Liver Function Tests: Recent Labs  Lab 06/25/23 1514  AST 17  ALT 12  ALKPHOS 69  BILITOT 0.6  PROT 5.4*  ALBUMIN 2.9*   No results for input(s): "LIPASE", "AMYLASE" in the last 168 hours. No results for input(s): "AMMONIA" in the last 168 hours. Coagulation Profile: No results for input(s): "INR", "PROTIME" in the last 168 hours. Cardiac Enzymes: No results for input(s): "CKTOTAL", "CKMB", "CKMBINDEX", "TROPONINI" in the last 168 hours. BNP (last 3 results) No results for input(s): "PROBNP" in the last 8760 hours. HbA1C: No results for input(s): "HGBA1C" in the last 72 hours. CBG: No results for input(s): "GLUCAP" in the last 168 hours. Lipid Profile: No results for input(s): "CHOL", "HDL", "LDLCALC", "TRIG", "CHOLHDL", "LDLDIRECT" in the last 72 hours. Thyroid  Function Tests: Recent Labs    06/25/23 1741  TSH 2.001  FREET4 1.16*   Anemia Panel: No results for input(s): "VITAMINB12", "  FOLATE", "FERRITIN", "TIBC", "IRON ", "RETICCTPCT" in the last 72 hours. Urine analysis:    Component Value Date/Time   COLORURINE YELLOW 05/07/2023 2316   APPEARANCEUR CLEAR 05/07/2023 2316   LABSPEC 1.020 05/07/2023 2316   PHURINE 5.0 05/07/2023 2316   GLUCOSEU NEGATIVE 05/07/2023 2316   HGBUR NEGATIVE 05/07/2023 2316   HGBUR trace-lysed 07/18/2009 1022   BILIRUBINUR NEGATIVE 05/07/2023 2316   BILIRUBINUR N 01/04/2017 1524   KETONESUR NEGATIVE 05/07/2023 2316   PROTEINUR NEGATIVE 05/07/2023 2316   UROBILINOGEN 0.2 01/04/2017 1524    UROBILINOGEN 0.2 07/18/2009 1022   NITRITE NEGATIVE 05/07/2023 2316   LEUKOCYTESUR NEGATIVE 05/07/2023 2316   Radiological Exams on Admission: CT Cervical Spine Wo Contrast Result Date: 06/25/2023 CLINICAL DATA:  Hypotension, fell EXAM: CT CERVICAL SPINE WITHOUT CONTRAST TECHNIQUE: Multidetector CT imaging of the cervical spine was performed without intravenous contrast. Multiplanar CT image reconstructions were also generated. RADIATION DOSE REDUCTION: This exam was performed according to the departmental dose-optimization program which includes automated exposure control, adjustment of the mA and/or kV according to patient size and/or use of iterative reconstruction technique. COMPARISON:  05/12/2023 FINDINGS: Alignment: Grossly normal anatomic alignment. Skull base and vertebrae: No acute fracture. No primary bone lesion or focal pathologic process. Soft tissues and spinal canal: No prevertebral fluid or swelling. No visible canal hematoma. Disc levels: Stable multilevel spondylosis most pronounced at C5-6. Prominent facet hypertrophic changes, greatest on the left at C3-4, C4-5, and C5-6. Upper chest: Airway is patent.  Lung apices are clear. Other: Reconstructed images demonstrate no additional findings. IMPRESSION: 1. No acute cervical spine fracture. 2. Stable multilevel cervical degenerative changes. Electronically Signed   By: Bobbye Burrow M.D.   On: 06/25/2023 16:36   CT Head Wo Contrast Result Date: 06/25/2023 CLINICAL DATA:  Hypotension, fell, anticoagulated EXAM: CT HEAD WITHOUT CONTRAST TECHNIQUE: Contiguous axial images were obtained from the base of the skull through the vertex without intravenous contrast. RADIATION DOSE REDUCTION: This exam was performed according to the departmental dose-optimization program which includes automated exposure control, adjustment of the mA and/or kV according to patient size and/or use of iterative reconstruction technique. COMPARISON:  05/12/2023  FINDINGS: Brain: Stable generalized cortical atrophy. Chronic cortical infarcts again noted within the right frontal, right parietal, left occipital regions. Stable chronic small vessel ischemic changes within the periventricular white matter. No evidence of acute infarct or hemorrhage. Lateral ventricles and remaining midline structures are unremarkable. No acute extra-axial fluid collections. No mass effect. Vascular: No hyperdense vessel or unexpected calcification. Skull: Right frontal scalp laceration. Negative for fracture or focal lesion. Sinuses/Orbits: Near complete opacification of the right frontal, bilateral ethmoid, right sphenoid, and bilateral maxillary sinuses. Gas fluid levels are seen within the right sphenoid and bilateral maxillary sinuses. Other: None. IMPRESSION: 1. Small right frontal scalp laceration. No underlying fracture or acute intracranial trauma. 2. Stable chronic ischemic changes as above. 3. Acute on chronic pansinus disease. Electronically Signed   By: Bobbye Burrow M.D.   On: 06/25/2023 16:34   DG Chest Portable 1 View Result Date: 06/25/2023 CLINICAL DATA:  Heart failure EXAM: PORTABLE CHEST 1 VIEW COMPARISON:  Chest x-ray 05/12/2023 FINDINGS: The heart is enlarged. The aorta is ectatic. There is central pulmonary vascular congestion. There is no focal lung infiltrate, pleural effusion or pneumothorax. There are surgical clips in the upper abdomen. No acute fractures are seen. IMPRESSION: Cardiomegaly with central pulmonary vascular congestion. Electronically Signed   By: Tyron Gallon M.D.   On: 06/25/2023 15:43   Data Reviewed:  Relevant notes from primary care and specialist visits, past discharge summaries as available in EHR, including Care Everywhere. Prior diagnostic testing as pertinent to current admission diagnoses, Updated medications and problem lists for reconciliation ED course, including vitals, labs, imaging, treatment and response to treatment,Triage  notes, nursing and pharmacy notes and ED provider's notes Notable results as noted in HPI.Discussed case with EDMD/ ED APP/ or Specialty MD on call and as needed.  Assessment & Plan  >> Orthostatic hypotension/dizziness/fall: Secondary to iatrogenic in medications and possible combination of chronic GI loss anemia. On discharge we will consider discontinuation of diuretic therapy or making it as needed only as needed with daily weights to monitor.  Fall precautions compression stockings aspiration precautions.   >> Normocytic anemia: No evidence of bleeding, patient had a negative stool occult in April of this year.  >> Paroxysmal atrial fibrillation/history of stroke: CHA2DS2/VAS Stroke Risk Points  Current as of about an hour ago     7 >= 2 Points: High Risk  We will continue Eliquis  but will also evaluate patient for stool occult.     >> Chronic intermittent diarrhea: Chart review shows patient is on loperamide .  Suspect patient may be losing blood or having diarrhea secondary to GI blood loss.  Will start patient on PPI therapy.    >> AKI on CKD stage IIIa: Lab Results  Component Value Date   CREATININE 1.25 (H) 06/25/2023   CREATININE 1.17 05/30/2023   CREATININE 1.21 05/12/2023  Avoid contrast and renally dose medications.  Will need medication titration attributed to prerenal etiology in his case.    >> CAD: Stable no complaints of chest pain or chest pressure.  EKG is nonischemic as nonspecific ST-T wave changes.    >> Chronic diastolic congestive heart failure with moderate aortic stenosis: Patient is followed by cardiology and has EF of 60 to 65% with a grade 3 diastolic dysfunction.    DVT prophylaxis:  SCD's Consults:  None Advance Care Planning:    Code Status: Full Code   Family Communication:  Wife on the phone Disposition Plan:  Home Severity of Illness: The appropriate patient status for this patient is OBSERVATION. Observation status is judged  to be reasonable and necessary in order to provide the required intensity of service to ensure the patient's safety. The patient's presenting symptoms, physical exam findings, and initial radiographic and laboratory data in the context of their medical condition is felt to place them at decreased risk for further clinical deterioration. Furthermore, it is anticipated that the patient will be medically stable for discharge from the hospital within 2 midnights of admission.   Unresulted Labs (From admission, onward)     Start     Ordered   06/26/23 0500  Comprehensive metabolic panel  Tomorrow morning,   R        06/25/23 2101   06/26/23 0500  CBC  Tomorrow morning,   R        06/25/23 2101   06/26/23 0500  Occult blood card to lab, stool RN will collect  Daily,   R     Question:  Specimen to be collected by:  Answer:  RN will collect   06/25/23 2101   06/26/23 0500  Magnesium   Tomorrow morning,   R       Question:  Specimen collection method  Answer:  Lab=Lab collect   06/25/23 2248   06/25/23 2101  Urinalysis, Routine w reflex microscopic -Urine, Random  Once,   R  Question:  Specimen Source  Answer:  Urine, Random   06/25/23 2101            Orders Placed This Encounter  Procedures   CT Head Wo Contrast   CT Cervical Spine Wo Contrast   DG Chest Portable 1 View   Comprehensive metabolic panel   CBC with Differential   Brain natriuretic peptide   Comprehensive metabolic panel   CBC   Occult blood card to lab, stool RN will collect   TSH   T4, free   Urinalysis, Routine w reflex microscopic -Urine, Random   Magnesium    Magnesium    Diet Heart Room service appropriate? Yes; Fluid consistency: Thin   ED Cardiac monitoring   Orthostatic vital signs   Maintain IV access   Vital signs   Notify physician (specify)   Mobility Protocol: No Restrictions RN to initiate protocols based on patient's level of care   Refer to Sidebar Report Refer to ICU, Med-Surg, Progressive, and  Step-Down Mobility Protocol Sidebars   Initiate Adult Central Line Maintenance and Catheter Protocol for patients with central line (CVC, PICC, Port, Hemodialysis, Trialysis)   Daily weights   Intake and Output   Do not place and if present remove PureWick   Initiate Oral Care Protocol   Initiate Carrier Fluid Protocol   RN may order General Admission PRN Orders utilizing "General Admission PRN medications" (through manage orders) for the following patient needs: allergy symptoms (Claritin ), cold sores (Carmex), cough (Robitussin DM), eye irritation (Liquifilm Tears), hemorrhoids (Tucks), indigestion (Maalox), minor skin irritation (Hydrocortisone Cream), muscle pain Lovena Rubinstein Gay), nose irritation (saline nasal spray) and sore throat (Chloraseptic spray).   Ambulate with assistance   Cardiac Monitoring - Continuous Indefinite   Full code   Consult to hospitalist   monitoring by pharmacy   Pulse oximetry check with vital signs   Oxygen  therapy Mode or (Route): Nasal cannula; Liters Per Minute: 2; Keep O2 saturation between: greater than 92 %   EKG 12-Lead   ED EKG   EKG   EKG   Saline lock IV   Place in observation (patient's expected length of stay will be less than 2 midnights)   Aspiration precautions   Fall precautions    Author: Lavanda Porter, MD 12 pm -8 pm. 06/26/2023 5:09 AM >>Please note for any concern,or critical results after hours past 8pm please contact the [facility] hospitalist [facility] floor coverage provider from 7 PM- 7 AM. For on call review [email or web address], username [email or web address] and PW: your phone number<<

## 2023-06-25 NOTE — ED Notes (Signed)
 Admitting at bedside

## 2023-06-25 NOTE — ED Provider Notes (Signed)
 [facility]ENTER   [id number] 06/25/23 Arrival Time: 1324  ASSESSMENT & PLAN:  1. Injury of head, initial encounter   2. Hypotension, unspecified hypotension type    Meds ordered this encounter  Medications   sodium chloride  0.9 % bolus 1,000 mL   CBG 131   Temp Pulse Rate BP Resp SpO2 Height Weight BMI (Calculated)   14:03 -- 96 70/55 -- 99 % -- -- --  13:57 -- -- 89/76 -- -- -- -- --  13:34 97.7 F (36.5 C) 89 91/52 16 96 % -- -- --   Alert and mentating. Carelink to ED for further evaluation.  Reviewed expectations re: course of current medical issues. Questions answered. Outlined signs and symptoms indicating need for more acute intervention. Patient verbalized understanding. After Visit Summary given.   SUBJECTIVE: History from: patient and wife. 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 She reports that he has fallen and hit his head 48 hours ago and, most recently, approximately 12 hours ago. Both times he denies LOC. Currently denies HA, visual changes, n/v. Is unsteady but used walker to get to triage room. Initially BP 91/52 that has decreased to 70/55 when attempting to stand. Denies CP/SOB. Skin abrasions to lower R leg today.  OBJECTIVE:  Vitals:   06/25/23 1334 06/25/23 1357 06/25/23 1403  BP: (!) 91/52 (!) 89/76 (!) 70/55  Pulse: 89  96  Resp: 16    Temp: 97.7 F (36.5 C)    TempSrc: Oral    SpO2: 96%  99%    General appearance: alert; no distress Eyes: PERRLA; EOMI; conjunctiva normal HENT: normocephalic; R sided skin abrasion without acute bleeding; other wound bandaged from fall 48 hours ago that we did not uncover before EMS arrived Neck: supple  Lungs: clear to auscultation bilaterally; unlabored Heart: regular Abdomen: soft, non-tender Skin: warm and dry; very large skin tear of RLE with mild bleeding Psychological: alert and cooperative; normal mood and  affect    Allergies  Allergen Reactions   Tylenol  [Acetaminophen ] Rash   Lactose Intolerance (Gi) Diarrhea and Nausea Only   Levaquin [Levofloxacin]     Triggered A-fib    Advil [Ibuprofen] Nausea Only and Rash   Jacquelynn Matter ] Rash    Past Medical History:  Diagnosis Date   Acute blood loss anemia 08/27/2021   Acute hemorrhagic infarction of brain [facility])    Allergic rhinitis    Anxiety    Asthma    Atrial fibrillation (HCC)    CAD (coronary artery disease)    CHF (congestive heart failure) (HCC)    Depression    Diverticulosis 03/16/1995   DJD (degenerative joint disease)    "most joints" (03/11/2015)   GERD (gastroesophageal reflux disease) 12/30/2000   Heart attack (HCC) 1984   Hiatal hernia 12/30/2000   History of blood transfusion 1988   "related to GI bleeding OR"   History of duodenal ulcer 10/15/1986   Hyperlipidemia    Hypertension    ICH (intracerebral hemorrhage) (HCC)    Sepsis (HCC) 12/2018   Sinus bradycardia    Stroke [facility])    Social History   Socioeconomic History   Marital status: Married    Spouse name: Not on file   Number of children: 2   Years of education: Not on file   Highest education level: Doctorate  Occupational History   Occupation: Therapist, nutritional: RETIRED  Comment: radiologist  Tobacco Use   Smoking status: Never   Smokeless tobacco: Never  Vaping Use   Vaping status: Never Used  Substance and Sexual Activity   Alcohol  use: No   Drug use: No   Sexual activity: Not Currently  Other Topics Concern   Not on file  Social History Narrative   Married. Regular exercise - yes.       03/26/2019: Married to Charity fundraiser, lives in 3 story home, although lives on first level mostly. Has two grown children.      Enjoys golfing; drives to [place]  with wife every winter for a few days, watches sports, following Corporate investment banker, reads about WWII,    Social Drivers of Corporate investment banker Strain: Low Risk  (06/23/2023)   Overall  Financial Resource Strain (CARDIA)    Difficulty of Paying Living Expenses: Not hard at all  Food Insecurity: No Food Insecurity (05/10/2023)   Hunger Vital Sign    Worried About Running Out of Food in the Last Year: Never true    Ran Out of Food in the Last Year: Never true  Transportation Needs: No Transportation Needs (05/10/2023)   PRAPARE - Administrator, Civil Service (Medical): No    Lack of Transportation (Non-Medical): No  Physical Activity: Sufficiently Active (06/23/2023)   Exercise Vital Sign    Days of Exercise per Week: 4 days    Minutes of Exercise per Session: 60 min  Stress: No Stress Concern Present (06/23/2023)   [employer] of Occupational Health - Occupational Stress Questionnaire    Feeling of Stress : Not at all  Social Connections: Moderately Isolated (05/06/2023)   Social Connection and Isolation Panel [NHANES]    Frequency of Communication with Friends and Family: More than three times a week    Frequency of Social Gatherings with Friends and Family: More than three times a week    Attends Religious Services: Never    Database administrator or Organizations: No    Attends Banker Meetings: Never    Marital Status: Married  Catering manager Violence: Not At Risk (05/10/2023)   Humiliation, Afraid, Rape, and Kick questionnaire    Fear of Current or Ex-Partner: No    Emotionally Abused: No    Physically Abused: No    Sexually Abused: No   Family History  Problem Relation Age of Onset   Coronary artery disease Mother        deceased   Deep vein thrombosis Sister    Coronary artery disease Brother    Asthma Brother    Colon cancer Neg Hx    Past Surgical History:  Procedure Laterality Date   CARDIAC CATHETERIZATION     CATARACT EXTRACTION W/ INTRAOCULAR LENS  IMPLANT, BILATERAL Bilateral ?2013   CHOLECYSTECTOMY OPEN  1989   CORONARY ANGIOPLASTY WITH STENT PLACEMENT  2010   hospital ccu  1984   heart attack, hypoplastic right  coronary   INGUINAL HERNIA REPAIR Right ~ 1990   NASAL SEPTUM SURGERY  1972   ORCHIECTOMY Bilateral 06/10/2017   Procedure: BILATERAL ORCHIECTOMY;  Surgeon: Trent Frizzle, MD;  Location: [facility] ORS;  Service: Urology;  Laterality: Bilateral;  MAC ANESTHESIA AND LOCAL   TONSILLECTOMY     TOTAL KNEE ARTHROPLASTY Bilateral 1998-2004   "right-left"   VAGOTOMY AND PYLOROPLASTY  1988   "bleeding duodenal ulcer"     Afton Albright, MD 06/25/23 1429

## 2023-06-25 NOTE — ED Triage Notes (Signed)
 Patient's wife reports that the patient fell on 06/23/23 and again today. Patient has an old bandage to the top of the head and today he had another head injury to the right side of his head and his right ear is bleeding.  Patient is on Eliquis .

## 2023-06-25 NOTE — ED Notes (Signed)
 Report given to Madolyn Schlatter, charge RN [facility]

## 2023-06-25 NOTE — ED Notes (Signed)
 Patient is being discharged from the Urgent Care and sent to the Emergency Department via care link . Per Dr. Barbar Bonus, patient is in need of higher level of care due to fall hitting his head hypotensive. Patient is aware and verbalizes understanding of plan of care.  Vitals:   06/25/23 1357 06/25/23 1403  BP: (!) 89/76 (!) 70/55  Pulse:  96  Resp:    Temp:    SpO2:  99%

## 2023-06-25 NOTE — ED Notes (Signed)
 NS bolus from urgent care completed.

## 2023-06-25 NOTE — ED Provider Notes (Signed)
 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  With a history of orthostatic hypotension, atrial fibrillation Eliquis , MI and stroke presents to ED after fall.  Patient has suffered multiple falls recently at home.  Today he went to the bathroom and after urination became very lightheaded causing him to fall.  He did strike his head.  No LOC.  Was noted to be hypotensive with EMS with systolic blood pressure in the 70s.  Obvious scalp abrasion with multiple abrasions over extremities however patient denies pain at this time.  No headaches neck pain chest pain shortness of breath abdominal pain or pain in extremities.   Fall       Home Medications Prior to Admission medications   Medication Sig Start Date End Date Taking? Authorizing Provider  apixaban  (ELIQUIS ) 5 MG TABS tablet Take 1 tablet (5 mg total) by mouth 2 (two) times daily. 02/28/23   Darlis Eisenmenger, MD  atorvastatin  (LIPITOR) 20 MG tablet TAKE 1 TABLET BY MOUTH EVERY DAY 05/11/23   Burchette, Marijean Shouts, MD  buPROPion  (WELLBUTRIN  SR) 150 MG 12 hr tablet TAKE 1 TABLET(150 MG) BY MOUTH DAILY 05/11/23   Burchette, Marijean Shouts, MD  cetirizine (ZYRTEC) 10 MG tablet Take 10 mg by mouth daily.    [provider]  Cholecalciferol  (VITAMIN D -3 PO) Take 1 capsule by mouth at bedtime.    [provider]  denosumab  (PROLIA ) 60 MG/ML SOSY injection Inject 60 mg into the skin every 6 (six) months.    [provider]  dofetilide  (TIKOSYN ) 250 MCG capsule TAKE 1 CAPSULE(250 MCG) BY MOUTH TWICE DAILY 01/28/23   Darlis Eisenmenger, MD  FLUoxetine  (PROZAC ) 10 MG capsule Take 1 capsule (10 mg total) by mouth daily. 05/11/23   Burchette, Marijean Shouts, MD  Fluticasone  Furoate (ARNUITY ELLIPTA ) 200 MCG/ACT AEPB Inhale 1 puff into the lungs daily.  11/19/22   Burchette, Marijean Shouts, MD  furosemide  (LASIX ) 20 MG tablet Take 1 tablet (20 mg total) by mouth every other day. 05/19/23   Darlis Eisenmenger, MD  Lactase (LACTAID PO) Take 1 tablet by mouth 3 (three) times daily with meals as needed (lactose intolerance).    [provider]  loperamide  (IMODIUM ) 2 MG capsule Take 2 capsules (4 mg total) by mouth 3 (three) times daily as needed for diarrhea or loose stools. 05/09/23   Krishnan, Gokul, MD  LORazepam  (ATIVAN ) 1 MG tablet TAKE 1/2 TO 1 TABLET(0.5 TO 1 MG) BY MOUTH AT BEDTIME AS NEEDED 03/24/23   Burchette, Marijean Shouts, MD  midodrine  (PROAMATINE ) 10 MG tablet Take 1.5 tablets (15 mg total) by mouth 3 (three) times daily with meals. 06/09/23   Darlis Eisenmenger, MD  omeprazole  (PRILOSEC  OTC) 20 MG tablet Take 1 tablet (20 mg total) by mouth daily. 09/17/13   Marybelle Smiling, MD  Polyethyl Glycol-Propyl Glycol (SYSTANE OP) Place 1 drop into both eyes 4 (four) times daily as needed (dry eye).    [provider]  tamsulosin  (FLOMAX ) 0.4 MG CAPS capsule TAKE 1 CAPSULE(0.4 MG) BY MOUTH DAILY 11/01/22   Burchette, Marijean Shouts, MD  traMADol  (ULTRAM ) 50 MG tablet Take 1 tablet (50 mg total) by mouth every 6 (six) hours as needed. 04/13/23   Glean Lamy  W, MD      Allergies    Tylenol  [acetaminophen ], Lactose intolerance (gi), Levaquin [levofloxacin], Advil [ibuprofen], and Asa [aspirin ]    Review of Systems   Review of Systems  Physical Exam Updated Vital Signs BP 135/65   Pulse 83   Temp 97.6 F (36.4 C) (Oral)   Resp 16   Ht 5\' 10"  (1.778 m)   Wt 66.2 kg   SpO2 100%   BMI 20.95 kg/m  Physical Exam Vitals and nursing note reviewed.  HENT:     Head:     Comments: Superficial abrasion with no active bleeding over crown of head no skull deformity Eyes:     Pupils: Pupils are equal, round, and reactive to light.  Cardiovascular:     Rate and Rhythm: Normal rate and regular rhythm.  Pulmonary:     Effort: Pulmonary effort is  normal.     Breath sounds: Normal breath sounds.  Abdominal:     Palpations: Abdomen is soft.     Tenderness: There is no abdominal tenderness.  Musculoskeletal:     Cervical back: Neck supple. No tenderness.     Comments: Skin tear over upper right and upper left extremities with no active bleeding Right lower extremity skin tear over lower leg no active bleeding No lacerations Full active range of motion bilateral upper and lower extremities with no bony deformity or tenderness  Skin:    General: Skin is warm and dry.  Neurological:     Mental Status: He is alert.  Psychiatric:        Mood and Affect: Mood normal.     ED Results / Procedures / Treatments   Labs (all labs ordered are listed, but only abnormal results are displayed) Labs Reviewed  COMPREHENSIVE METABOLIC PANEL WITH GFR - Abnormal; Notable for the following components:      Result Value   Glucose, Bld 121 (*)    BUN 27 (*)    Creatinine, Ser 1.25 (*)    Calcium  8.4 (*)    Total Protein 5.4 (*)    Albumin 2.9 (*)    GFR, Estimated 54 (*)    All other components within normal limits  CBC WITH DIFFERENTIAL/PLATELET - Abnormal; Notable for the following components:   WBC 10.8 (*)    RBC 2.92 (*)    Hemoglobin 9.0 (*)    HCT 27.8 (*)    Neutro Abs 8.4 (*)    Monocytes Absolute 1.1 (*)    All other components within normal limits  BRAIN NATRIURETIC PEPTIDE - Abnormal; Notable for the following components:   B Natriuretic Peptide 346.6 (*)    All other components within normal limits  TROPONIN I (HIGH SENSITIVITY) - Abnormal; Notable for the following components:   Troponin I (High Sensitivity) 34 (*)    All other components within normal limits  TROPONIN I (HIGH SENSITIVITY) - Abnormal; Notable for the following components:   Troponin I (High Sensitivity) 33 (*)    All other components within normal limits    EKG EKG Interpretation Date/Time:  Saturday Jun 25 2023 14:38:11 EDT Ventricular Rate:   78 PR Interval:  177 QRS Duration:  96 QT Interval:  404 QTC Calculation: 461 R Axis:   128  Text Interpretation: Sinus rhythm Probable lateral infarct, age indeterminate Confirmed by Rafael Bun [phone]) on 06/25/2023 5:46:27 PM  Radiology CT Cervical Spine Wo Contrast Result Date: 06/25/2023 CLINICAL DATA:  Hypotension, fell EXAM: CT CERVICAL SPINE WITHOUT CONTRAST TECHNIQUE: Multidetector CT  imaging of the cervical spine was performed without intravenous contrast. Multiplanar CT image reconstructions were also generated. RADIATION DOSE REDUCTION: This exam was performed according to the departmental dose-optimization program which includes automated exposure control, adjustment of the mA and/or kV according to patient size and/or use of iterative reconstruction technique. COMPARISON:  05/12/2023 FINDINGS: Alignment: Grossly normal anatomic alignment. Skull base and vertebrae: No acute fracture. No primary bone lesion or focal pathologic process. Soft tissues and spinal canal: No prevertebral fluid or swelling. No visible canal hematoma. Disc levels: Stable multilevel spondylosis most pronounced at C5-6. Prominent facet hypertrophic changes, greatest on the left at C3-4, C4-5, and C5-6. Upper chest: Airway is patent.  Lung apices are clear. Other: Reconstructed images demonstrate no additional findings. IMPRESSION: 1. No acute cervical spine fracture. 2. Stable multilevel cervical degenerative changes. Electronically Signed   By: Bobbye Burrow M.D.   On: 06/25/2023 16:36   CT Head Wo Contrast Result Date: 06/25/2023 CLINICAL DATA:  Hypotension, fell, anticoagulated EXAM: CT HEAD WITHOUT CONTRAST TECHNIQUE: Contiguous axial images were obtained from the base of the skull through the vertex without intravenous contrast. RADIATION DOSE REDUCTION: This exam was performed according to the departmental dose-optimization program which includes automated exposure control, adjustment of the mA and/or kV  according to patient size and/or use of iterative reconstruction technique. COMPARISON:  05/12/2023 FINDINGS: Brain: Stable generalized cortical atrophy. Chronic cortical infarcts again noted within the right frontal, right parietal, left occipital regions. Stable chronic small vessel ischemic changes within the periventricular white matter. No evidence of acute infarct or hemorrhage. Lateral ventricles and remaining midline structures are unremarkable. No acute extra-axial fluid collections. No mass effect. Vascular: No hyperdense vessel or unexpected calcification. Skull: Right frontal scalp laceration. Negative for fracture or focal lesion. Sinuses/Orbits: Near complete opacification of the right frontal, bilateral ethmoid, right sphenoid, and bilateral maxillary sinuses. Gas fluid levels are seen within the right sphenoid and bilateral maxillary sinuses. Other: None. IMPRESSION: 1. Small right frontal scalp laceration. No underlying fracture or acute intracranial trauma. 2. Stable chronic ischemic changes as above. 3. Acute on chronic pansinus disease. Electronically Signed   By: Bobbye Burrow M.D.   On: 06/25/2023 16:34   DG Chest Portable 1 View Result Date: 06/25/2023 CLINICAL DATA:  Heart failure EXAM: PORTABLE CHEST 1 VIEW COMPARISON:  Chest x-ray 05/12/2023 FINDINGS: The heart is enlarged. The aorta is ectatic. There is central pulmonary vascular congestion. There is no focal lung infiltrate, pleural effusion or pneumothorax. There are surgical clips in the upper abdomen. No acute fractures are seen. IMPRESSION: Cardiomegaly with central pulmonary vascular congestion. Electronically Signed   By: Tyron Gallon M.D.   On: 06/25/2023 15:43    Procedures Procedures    Medications Ordered in ED Medications  sodium chloride  0.9 % bolus 1,000 mL (0 mLs Intravenous Hold 06/25/23 1511)    ED Course/ Medical Decision Making/ A&P Clinical Course as of 06/25/23 1940  Sat Jun 25, 2023  1937 Delta  troponin flat.  Low special for ACS.  No new ischemic changes on EKG.  CT C-spine and CT head showed no acute traumatic findings CT chest looks clear without evidence of pulmonary edema or traumatic injury.  Labs at baseline.  We did obtain orthostatic vital signs here and patient was significantly orthostatic.  This is likely the reason for multiple falls at home.  Considering he has high risk for severe bleeding or severe injury he will require admission for further evaluation and monitoring [MP]  Clinical Course User Index [MP] Sallyanne Creamer, DO                                 Medical Decision Making 89 year old male with history as above presenting after multiple falls.  Marvell Slider today after using the restroom.  Obvious head trauma.  Multiple skin tears.  He is anticoagulated on Eliquis  which he is compliant with.  Does have a history of orthostatic hypotension for which he takes midodrine  .  15 mg 3 times daily.  Reportedly hypotensive with EMS.  Hemodynamically stable here.  Will obtain traumatic workup including CT head C-spine chest x-ray to look for traumatic injury along with laboratory workup including high sensitive troponin given extensive cardiac history, have concern for syncope here.  Amount and/or Complexity of Data Reviewed Labs: ordered. Radiology: ordered.           Final Clinical Impression(s) / ED Diagnoses Final diagnoses:  Orthostatic hypotension  Fall in home, initial encounter  Skin avulsion  Injury of head, initial encounter    Rx / DC Orders ED Discharge Orders     None         Sallyanne Creamer, DO 06/25/23 1940

## 2023-06-26 DIAGNOSIS — I480 Paroxysmal atrial fibrillation: Secondary | ICD-10-CM | POA: Diagnosis not present

## 2023-06-26 DIAGNOSIS — S0990XA Unspecified injury of head, initial encounter: Secondary | ICD-10-CM | POA: Diagnosis not present

## 2023-06-26 DIAGNOSIS — W19XXXA Unspecified fall, initial encounter: Secondary | ICD-10-CM | POA: Diagnosis not present

## 2023-06-26 DIAGNOSIS — I5032 Chronic diastolic (congestive) heart failure: Secondary | ICD-10-CM | POA: Diagnosis not present

## 2023-06-26 DIAGNOSIS — Y92009 Unspecified place in unspecified non-institutional (private) residence as the place of occurrence of the external cause: Secondary | ICD-10-CM | POA: Diagnosis not present

## 2023-06-26 DIAGNOSIS — T148XXA Other injury of unspecified body region, initial encounter: Secondary | ICD-10-CM | POA: Diagnosis not present

## 2023-06-26 DIAGNOSIS — R42 Dizziness and giddiness: Secondary | ICD-10-CM

## 2023-06-26 DIAGNOSIS — I951 Orthostatic hypotension: Secondary | ICD-10-CM | POA: Diagnosis not present

## 2023-06-26 LAB — CBC
HCT: 27.5 % — ABNORMAL LOW (ref 39.0–52.0)
Hemoglobin: 8.8 g/dL — ABNORMAL LOW (ref 13.0–17.0)
MCH: 30.1 pg (ref 26.0–34.0)
MCHC: 32 g/dL (ref 30.0–36.0)
MCV: 94.2 fL (ref 80.0–100.0)
Platelets: 196 10*3/uL (ref 150–400)
RBC: 2.92 MIL/uL — ABNORMAL LOW (ref 4.22–5.81)
RDW: 14.5 % (ref 11.5–15.5)
WBC: 10.1 10*3/uL (ref 4.0–10.5)
nRBC: 0 % (ref 0.0–0.2)

## 2023-06-26 LAB — COMPREHENSIVE METABOLIC PANEL WITH GFR
ALT: 12 U/L (ref 0–44)
AST: 14 U/L — ABNORMAL LOW (ref 15–41)
Albumin: 2.9 g/dL — ABNORMAL LOW (ref 3.5–5.0)
Alkaline Phosphatase: 69 U/L (ref 38–126)
Anion gap: 6 (ref 5–15)
BUN: 20 mg/dL (ref 8–23)
CO2: 22 mmol/L (ref 22–32)
Calcium: 8.2 mg/dL — ABNORMAL LOW (ref 8.9–10.3)
Chloride: 106 mmol/L (ref 98–111)
Creatinine, Ser: 1.04 mg/dL (ref 0.61–1.24)
GFR, Estimated: >60 mL/min (ref >60)
Glucose, Bld: 108 mg/dL — ABNORMAL HIGH (ref 70–99)
Potassium: 4 mmol/L (ref 3.5–5.1)
Sodium: 134 mmol/L — ABNORMAL LOW (ref 135–145)
Total Bilirubin: 0.7 mg/dL (ref 0.0–1.2)
Total Protein: 5.5 g/dL — ABNORMAL LOW (ref 6.5–8.1)

## 2023-06-26 LAB — MAGNESIUM: Magnesium: 2.7 mg/dL — ABNORMAL HIGH (ref 1.7–2.4)

## 2023-06-26 MED ORDER — PANTOPRAZOLE SODIUM 40 MG PO TBEC
40.0000 mg | DELAYED_RELEASE_TABLET | Freq: Every day | ORAL | Status: DC
  Administered 2023-06-26 – 2023-06-28 (×3): 40 mg via ORAL
  Filled 2023-06-26 (×3): qty 1

## 2023-06-26 MED ORDER — LACTASE 3000 UNITS PO TABS: 3000.0000 [IU] | ORAL_TABLET | Freq: Three times a day (TID) | ORAL | Status: DC | PRN

## 2023-06-26 MED ORDER — APIXABAN 5 MG PO TABS
5.0000 mg | ORAL_TABLET | Freq: Two times a day (BID) | ORAL | Status: DC
  Administered 2023-06-26 – 2023-06-28 (×4): 5 mg via ORAL
  Filled 2023-06-26 (×4): qty 1

## 2023-06-26 MED ORDER — LOPERAMIDE HCL 2 MG PO CAPS: 2.0000 mg | ORAL_CAPSULE | ORAL | Status: DC | PRN

## 2023-06-26 MED ORDER — DOUBLE ANTIBIOTIC 500-10000 UNIT/GM EX OINT
TOPICAL_OINTMENT | Freq: Every day | CUTANEOUS | Status: DC
  Filled 2023-06-26: qty 28.4

## 2023-06-26 MED ORDER — OMEPRAZOLE MAGNESIUM 20 MG PO TBEC: 20.0000 mg | DELAYED_RELEASE_TABLET | Freq: Every day | ORAL | Status: DC

## 2023-06-26 MED ORDER — LORAZEPAM 0.5 MG PO TABS
0.5000 mg | ORAL_TABLET | Freq: Every day | ORAL | Status: DC
  Administered 2023-06-26 – 2023-06-27 (×2): 1 mg via ORAL
  Filled 2023-06-26 (×2): qty 2

## 2023-06-26 NOTE — Consult Note (Signed)
 WOC Nurse Consult Note: Reason for Consult: scalp and LE wounds from fall Wound type: partial thickness,, skin tears. trauma Pressure Injury POA: NA Measurement: see nursing flow sheets Wound bed: see nursing flow sheets Drainage (amount, consistency, odor) see nursing flow sheets Periwound: see nursing flow sheet Dressing procedure/placement/frequency: Cleanse with saline, pat dry Cover with single layer of xeroform Top with foam, if foam will not stick on scalp wounds, use antibiotic ointment only   Thanks  Amiri Tritch [employer], RN,CWOCN, CNS, CWON-AP [phone])

## 2023-06-26 NOTE — Evaluation (Signed)
 Physical Therapy Evaluation Patient Details Name: [name] BEVINS, MD MRN: [id number] DOB: 03/15/1929 Today's Date: 06/26/2023  History of Present Illness  Pt is a 89 y.o. male presenting 06/25/23 s/p fall in the bathroom where he hit his head. Admitted with syncope. Head and neck imaging were unremarkable. PMH HTN, pulmonary HTN, A-fib, hx of CVA, HF, CKD 3a, CAD, GERD, depression, HLD   Clinical Impression  Pt supine in bed upon arrival with family present and agreeable to PT eval. PTA, pt was ModI with a RW with a history of multiple falls. Pt attributes these to either orthostatic hypotension or moving quickly due to urinary urgency at night. Pt was ModI for bed mobility and supervision to stand with a RW. Limited gait distance today due to reports of dizziness with BP readings below. Pt will have 24/7 physical assist available upon return home.Pt prefers to have HHPT to work on balance deficits and prevent future falls. Acute PT to follow.    06/26/23 1556  Orthostatic Lying   BP- Lying 135/74  Pulse- Lying 81  Orthostatic Sitting  BP- Sitting 95/60  Pulse- Sitting 84  Orthostatic Standing at 0 minutes  BP- Standing at 0 minutes 95/49  Pulse- Standing at 0 minutes 84   Orthostatic Standing at 3 minutes BP- 88/47 Pulse- 90       If plan is discharge home, recommend the following: A little help with walking and/or transfers;A little help with bathing/dressing/bathroom;Assistance with cooking/housework;Assist for transportation;Help with stairs or ramp for entrance   Can travel by private vehicle    Yes    Equipment Recommendations None recommended by PT     Functional Status Assessment Patient has had a recent decline in their functional status and demonstrates the ability to make significant improvements in function in a reasonable and predictable amount of time.     Precautions / Restrictions Precautions Precautions: Fall Recall of Precautions/Restrictions:  Intact Precaution/Restrictions Comments: Orthostatic BP Restrictions Weight Bearing Restrictions Per Provider Order: No      Mobility  Bed Mobility Overal bed mobility: Modified Independent       Transfers Overall transfer level: Needs assistance Equipment used: Rolling walker (2 wheels) Transfers: Sit to/from Stand Sit to Stand: Supervision    General transfer comment: supervision for safety, no physical assist needed    Ambulation/Gait Ambulation/Gait assistance: Contact guard assist Gait Distance (Feet): 40 Feet Assistive device: Rolling walker (2 wheels) Gait Pattern/deviations: Step-through pattern, Decreased stride length, Trunk flexed Gait velocity: decr     General Gait Details: slightly unsteady gait, however, no overt LOB. Limited gait distance due to feeling dizzy with orthostatic BP readings     Balance Overall balance assessment: Needs assistance Sitting-balance support: Feet supported Sitting balance-Leahy Scale: Good     Standing balance support: Reliant on assistive device for balance Standing balance-Leahy Scale: Poor Standing balance comment: reliant on RW          Pertinent Vitals/Pain Pain Assessment Pain Assessment: No/denies pain    Home Living Family/patient expects to be discharged to:: Private residence Living Arrangements: Spouse/significant other Available Help at Discharge: Family;Available 24 hours/day Type of Home: Other(Comment) Home Access: Stairs to enter Entrance Stairs-Rails: Right Entrance Stairs-Number of Steps: 3   Home Layout: Multi-level;Able to live on main level with bedroom/bathroom Home Equipment: Shower seat;Grab bars - tub/shower;Rollator (4 wheels);Wheelchair - Forensic psychologist (2 wheels);BSC/3in1      Prior Function Prior Level of Function : Independent/Modified Independent;History of Falls (last six months)  Mobility Comments: ModI with RW in the home. Multiple falls which pt attributes to  moving too quickly due to urinary urgency and orthostatic hypotension ADLs Comments: Mod I for ADLs, wife mostly manages IADLs but pt assists as able     Extremity/Trunk Assessment   Upper Extremity Assessment Upper Extremity Assessment: Defer to OT evaluation    Lower Extremity Assessment Lower Extremity Assessment: Overall WFL for tasks assessed    Cervical / Trunk Assessment Cervical / Trunk Assessment: Kyphotic  Communication   Communication Communication: Impaired Factors Affecting Communication: Hearing impaired    Cognition Arousal: Alert Behavior During Therapy: Impulsive   PT - Cognitive impairments: Safety/Judgement        PT - Cognition Comments: impulsive with moving quickly Following commands: Intact       Cueing Cueing Techniques: Verbal cues     General Comments General comments (skin integrity, edema, etc.): Daughter present during session and supportive     PT Assessment Patient needs continued PT services  PT Problem List Decreased activity tolerance;Decreased balance;Decreased mobility;Decreased safety awareness       PT Treatment Interventions DME instruction;Gait training;Stair training;Functional mobility training;Therapeutic activities;Therapeutic exercise;Balance training;Neuromuscular re-education;Patient/family education    PT Goals (Current goals can be found in the Care Plan section)  Acute Rehab PT Goals Patient Stated Goal: to get better and prevent falls PT Goal Formulation: With patient Time For Goal Achievement: 07/10/23 Potential to Achieve Goals: Good    Frequency Min 2X/week        AM-PAC PT "6 Clicks" Mobility  Outcome Measure Help needed turning from your back to your side while in a flat bed without using bedrails?: None Help needed moving from lying on your back to sitting on the side of a flat bed without using bedrails?: None Help needed moving to and from a bed to a chair (including a wheelchair)?: A  Little Help needed standing up from a chair using your arms (e.g., wheelchair or bedside chair)?: A Little Help needed to walk in hospital room?: A Little Help needed climbing 3-5 steps with a railing? : A Lot 6 Click Score: 19    End of Session Equipment Utilized During Treatment: Gait belt Activity Tolerance: Patient tolerated treatment well Patient left: in bed;with call bell/phone within reach;with family/visitor present Nurse Communication: Mobility status;Other (comment) (BP readings) PT Visit Diagnosis: Unsteadiness on feet (R26.81);Other abnormalities of gait and mobility (R26.89);History of falling (Z91.81)    Time: 5784-6962 PT Time Calculation (min) (ACUTE ONLY): 22 min   Charges:   PT Evaluation $PT Eval Low Complexity: 1 Low   PT General Charges $$ ACUTE PT VISIT: 1 Visit    Orysia Blas, PT, DPT Secure Chat Preferred  Rehab Office [phone]   Alissa April Adela Ades 06/26/2023, 4:59 PM

## 2023-06-26 NOTE — Evaluation (Signed)
 Occupational Therapy Evaluation Patient Details Name: [name] BEGAY, MD MRN: [id number] DOB: 1929-11-08 Today's Date: 06/26/2023   History of Present Illness   Pt is a 89 y.o. male presenting 06/25/23 s/p fall in the bathroom where he hit his head. Admitted with syncope. Head and neck imaging were unremarkable. PMH HTN, pulmonary HTN, A-fib, hx of CVA, HF, CKD 3a, CAD, GERD, depression, HLD     Clinical Impressions Patient admitted for the diagnosis above.  Very similar presentation to last admit for syncope in Mach of 25.  Patient is impulsive and demonstrates poor balance, needing Min A at times for mobility, especially with transitional movements.  Min A for ADL completion due to stand balance deficits and increased dizziness.  BP 111/68.  OT will follow in the acute setting to address deficits, but patient wishes to return home with prior [facility] OT/PT services.       If plan is discharge home, recommend the following:   A little help with bathing/dressing/bathroom;A little help with walking and/or transfers;Help with stairs or ramp for entrance     Functional Status Assessment   Patient has had a recent decline in their functional status and demonstrates the ability to make significant improvements in function in a reasonable and predictable amount of time.     Equipment Recommendations   None recommended by OT     Recommendations for Other Services         Precautions/Restrictions   Precautions Precautions: Fall Recall of Precautions/Restrictions: Intact Precaution/Restrictions Comments: Watch BP Restrictions Weight Bearing Restrictions Per Provider Order: No     Mobility Bed Mobility               General bed mobility comments: up in the recliner    Transfers Overall transfer level: Needs assistance Equipment used: Rolling walker (2 wheels) Transfers: Sit to/from Stand, Bed to chair/wheelchair/BSC Sit to Stand: Supervision     Step pivot  transfers: Contact guard assist, Min assist            Balance Overall balance assessment: Needs assistance Sitting-balance support: Feet supported Sitting balance-Leahy Scale: Good     Standing balance support: Reliant on assistive device for balance Standing balance-Leahy Scale: Poor                             ADL either performed or assessed with clinical judgement   ADL       Grooming: Supervision/safety;Standing               Lower Body Dressing: Contact guard assist;Sit to/from stand;Minimal assistance   Toilet Transfer: Contact guard assist;Minimal assistance;Rolling walker (2 wheels);Regular Toilet;Ambulation             General ADL Comments: poor safety with complaints of feeling light headed     Vision Patient Visual Report: No change from baseline       Perception Perception: Not tested       Praxis Praxis: Not tested       Pertinent Vitals/Pain Pain Assessment Pain Assessment: No/denies pain     Extremity/Trunk Assessment Upper Extremity Assessment Upper Extremity Assessment: Overall WFL for tasks assessed   Lower Extremity Assessment Lower Extremity Assessment: Defer to PT evaluation   Cervical / Trunk Assessment Cervical / Trunk Assessment: Kyphotic   Communication Communication Communication: Impaired Factors Affecting Communication: Hearing impaired   Cognition Arousal: Alert Behavior During Therapy: Impulsive Cognition: No apparent impairments  Following commands: Intact       Cueing  General Comments          Exercises     Shoulder Instructions      Home Living Family/patient expects to be discharged to:: Private residence Living Arrangements: Spouse/significant other Available Help at Discharge: Family;Available 24 hours/day Type of Home: Other(Comment) Home Access: Stairs to enter Entrance Stairs-Number of Steps: 3 Entrance Stairs-Rails:  Right Home Layout: Multi-level;Able to live on main level with bedroom/bathroom     Bathroom Shower/Tub: Producer, television/film/video: Handicapped height Bathroom Accessibility: Yes How Accessible: Accessible via walker Home Equipment: Shower seat;Grab bars - tub/shower;Rollator (4 wheels);Wheelchair - Forensic psychologist (2 wheels);BSC/3in1          Prior Functioning/Environment Prior Level of Function : Independent/Modified Independent;History of Falls (last six months)             Mobility Comments: Stated using RW in the home ADLs Comments: Mod I for ADLs, wife mostly manages IADLs but pt assists as able    OT Problem List: Decreased activity tolerance;Impaired balance (sitting and/or standing)   OT Treatment/Interventions: Self-care/ADL training;Therapeutic activities;Patient/family education;DME and/or AE instruction;Balance training      OT Goals(Current goals can be found in the care plan section)   Acute Rehab OT Goals Patient Stated Goal: Return home OT Goal Formulation: With patient Time For Goal Achievement: 07/11/23 Potential to Achieve Goals: Good ADL Goals Pt Will Perform Grooming: with modified independence;standing Pt Will Perform Lower Body Dressing: with modified independence;sit to/from stand Pt Will Transfer to Toilet: with modified independence;regular height toilet;ambulating   OT Frequency:  Min 2X/week    Co-evaluation              AM-PAC OT "6 Clicks" Daily Activity     Outcome Measure Help from another person eating meals?: None Help from another person taking care of personal grooming?: A Little Help from another person toileting, which includes using toliet, bedpan, or urinal?: A Little Help from another person bathing (including washing, rinsing, drying)?: A Little Help from another person to put on and taking off regular upper body clothing?: None Help from another person to put on and taking off regular lower body  clothing?: A Little 6 Click Score: 20   End of Session Equipment Utilized During Treatment: Gait belt;Rolling walker (2 wheels) Nurse Communication: Mobility status  Activity Tolerance: Patient tolerated treatment well Patient left: in chair;with call bell/phone within reach;with chair alarm set;with family/visitor present  OT Visit Diagnosis: Unsteadiness on feet (R26.81)                Time: 4098-1191 OT Time Calculation (min): 21 min Charges:  OT General Charges $OT Visit: 1 Visit OT Evaluation $OT Eval Moderate Complexity: 1 Mod  06/26/2023  RP, OTR/L  Acute Rehabilitation Services  Office:  [phone]   Benjamen Brand 06/26/2023, 1:26 PM

## 2023-06-26 NOTE — Progress Notes (Signed)
 Met with pt and family to discuss the MOON notice. Pt's wife asked who can help with DME. They asked about the PureWick system. Contacted Ada at [facility] and she reports they don't assist with this system. Deb, CM reports he can discuss the PureWick his PCP. Met with pt and daughter again and encouraged them to discuss the urine system with his PCP or urologist. Per daughter, pt sees an urologist.

## 2023-06-26 NOTE — Progress Notes (Signed)
 [facility]                                                                               [name] [name], is a 89 y.o. 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  Patient was also admitted in March with similar presentation and has a  history of dizziness and lightheadedness and is on midodrine .  Wife at bedside reports that he wakes up in the middle of the night aleast 4 to 5 times a night and makes him more prone to falling . He was found to have atleast 8 cm skin tear on the right lower extremity and 3 cm skin tear on his scalp.  He is adamant about taking eliquis , despite bleeding from the sites of skin tears.   Assessment & Plan    Assessment and Plan:  Persistent dizziness/ lightheadedness:  Multiple falls recently.  Suspect from orthostatic hypotension and concomitant use of diuretics Stopped lasix .  Monitor overnight on telemetry.  Check orthostatic vital signs in am with PT.  Continue with midodrine  15 mg TID.    Anemia of chronic disease:  Hemoglobin appears to be at baseline around 9.     PAF/ h/o stroke.  Continue with eliquis .     Chronic intermittent diarrhea.  Continue with Loperamide .     Aki on stage 3 a CKD Resolved. Creatinine at baseline.     CAD Stable.    Chronic diastolic heart failure -appears euvolemic.    Estimated body mass index is 20.95 kg/m as calculated from the following:   Height as of this encounter: 5\' 10"  (1.778 m).   Weight as of this encounter: 66.2 kg.  Code Status:  full code.  DVT Prophylaxis:   apixaban  (ELIQUIS ) tablet 5 mg   Level of Care: Level of care: Telemetry Medical Family Communication: family at bedside.   Disposition Plan:     Remains inpatient appropriate:  pending.   Procedures:  None.   Consultants:   Cardiology will be consulted in am.   Antimicrobials:   Anti-infectives (From admission, onward)    None        Medications  Scheduled Meds:  apixaban   5 mg Oral BID   atorvastatin   20 mg Oral Daily   buPROPion   150 mg Oral Daily   dofetilide   250 mcg Oral BID   FLUoxetine   10 mg Oral Daily   LORazepam   0.5-1 mg Oral QHS   midodrine   15 mg Oral TID WC   pantoprazole   40 mg Oral Daily   sodium chloride  flush  3 mL Intravenous Q12H   sodium chloride  flush  3-10 mL Intravenous Q12H   tamsulosin   0.4 mg Oral Daily  Continuous Infusions:  sodium chloride  Stopped (06/25/23 1511)   PRN Meds:.lactase, loperamide , sodium chloride  flush    Subjective:   [name] [name] was seen and examined today.  Comfortable, denies any chest pain. No nausea or vomiting.   Objective:   Vitals:   06/25/23 2342 06/26/23 0410 06/26/23 0730 06/26/23 1143  BP: 118/61 (!) 100/59 109/68 (!) 102/54  Pulse: 80 80 79 73  Resp: 18 16 17 17   Temp: 97.7 F (36.5 C) 98.6 F (37 C) 98.2 F (36.8 C) 97.7 F (36.5 C)  TempSrc: Oral  Oral Oral  SpO2: 99% 98% 98% 100%  Weight:      Height:        Intake/Output Summary (Last 24 hours) at 06/26/2023 1348 Last data filed at 06/26/2023 1340 Gross per 24 hour  Intake 220 ml  Output 350 ml  Net -130 ml   Filed Weights   06/25/23 1433  Weight: 66.2 kg     Exam General exam: ill appearing elderly gentleman with multiple bruises.  Respiratory system: Clear to auscultation. Respiratory effort normal. Cardiovascular system: S1 & S2 heard, RRR. No JVD,  Gastrointestinal system: Abdomen is nondistended, soft and nontender. Central nervous system: Alert and oriented grossly non  focal Extremities: skin tears on the rght lower extremity. No pedal edema.  Skin: multiple skin tears on the right LE, bilateral knees and on the scalp and right ear.  Psychiatry: anxious.    Data Reviewed:  I have personally reviewed following labs and imaging studies   CBC Lab Results  Component Value Date   WBC 10.1 06/26/2023   RBC 2.92 (L) 06/26/2023   HGB 8.8 (L) 06/26/2023   HCT 27.5 (L) 06/26/2023   MCV 94.2 06/26/2023   MCH 30.1 06/26/2023   PLT 196 06/26/2023   MCHC 32.0 06/26/2023   RDW 14.5 06/26/2023   LYMPHSABS 0.9 06/25/2023   MONOABS 1.1 (H) 06/25/2023   EOSABS 0.3 06/25/2023   BASOSABS 0.1 06/25/2023     Last metabolic panel Lab Results  Component Value Date   NA 134 (L) 06/26/2023   K 4.0 06/26/2023   CL 106 06/26/2023   CO2 22 06/26/2023   BUN 20 06/26/2023   CREATININE 1.04 06/26/2023   GLUCOSE 108 (H) 06/26/2023   GFRNONAA >60 06/26/2023   GFRAA >60 10/17/2019   CALCIUM  8.2 (L) 06/26/2023   PROT 5.5 (L) 06/26/2023   ALBUMIN 2.9 (L) 06/26/2023   BILITOT 0.7 06/26/2023   ALKPHOS 69 06/26/2023   AST 14 (L) 06/26/2023   ALT 12 06/26/2023   ANIONGAP 6 06/26/2023    CBG (last 3)  No results for input(s): "GLUCAP" in the last 72 hours.    Coagulation Profile: No results for input(s): "INR", "PROTIME" in the last 168 hours.   Radiology Studies: CT Cervical Spine Wo Contrast Result Date: 06/25/2023 CLINICAL DATA:  Hypotension, fell EXAM: CT CERVICAL SPINE WITHOUT CONTRAST TECHNIQUE: Multidetector CT imaging of the cervical spine was performed without intravenous contrast. Multiplanar CT image reconstructions were also generated. RADIATION DOSE REDUCTION: This exam was performed according to the departmental dose-optimization program which includes automated exposure control, adjustment of the mA and/or kV according to patient size and/or use of iterative reconstruction technique. COMPARISON:  05/12/2023 FINDINGS: Alignment: Grossly normal  anatomic alignment. Skull base and vertebrae: No acute fracture. No primary bone lesion or focal pathologic process. Soft tissues and spinal canal: No prevertebral fluid or swelling. No visible canal hematoma. Disc levels: Stable multilevel spondylosis most pronounced at C5-6.  Prominent facet hypertrophic changes, greatest on the left at C3-4, C4-5, and C5-6. Upper chest: Airway is patent.  Lung apices are clear. Other: Reconstructed images demonstrate no additional findings. IMPRESSION: 1. No acute cervical spine fracture. 2. Stable multilevel cervical degenerative changes. Electronically Signed   By: Bobbye Burrow M.D.   On: 06/25/2023 16:36   CT Head Wo Contrast Result Date: 06/25/2023 CLINICAL DATA:  Hypotension, fell, anticoagulated EXAM: CT HEAD WITHOUT CONTRAST TECHNIQUE: Contiguous axial images were obtained from the base of the skull through the vertex without intravenous contrast. RADIATION DOSE REDUCTION: This exam was performed according to the departmental dose-optimization program which includes automated exposure control, adjustment of the mA and/or kV according to patient size and/or use of iterative reconstruction technique. COMPARISON:  05/12/2023 FINDINGS: Brain: Stable generalized cortical atrophy. Chronic cortical infarcts again noted within the right frontal, right parietal, left occipital regions. Stable chronic small vessel ischemic changes within the periventricular white matter. No evidence of acute infarct or hemorrhage. Lateral ventricles and remaining midline structures are unremarkable. No acute extra-axial fluid collections. No mass effect. Vascular: No hyperdense vessel or unexpected calcification. Skull: Right frontal scalp laceration. Negative for fracture or focal lesion. Sinuses/Orbits: Near complete opacification of the right frontal, bilateral ethmoid, right sphenoid, and bilateral maxillary sinuses. Gas fluid levels are seen within the right sphenoid and bilateral maxillary  sinuses. Other: None. IMPRESSION: 1. Small right frontal scalp laceration. No underlying fracture or acute intracranial trauma. 2. Stable chronic ischemic changes as above. 3. Acute on chronic pansinus disease. Electronically Signed   By: Bobbye Burrow M.D.   On: 06/25/2023 16:34   DG Chest Portable 1 View Result Date: 06/25/2023 CLINICAL DATA:  Heart failure EXAM: PORTABLE CHEST 1 VIEW COMPARISON:  Chest x-ray 05/12/2023 FINDINGS: The heart is enlarged. The aorta is ectatic. There is central pulmonary vascular congestion. There is no focal lung infiltrate, pleural effusion or pneumothorax. There are surgical clips in the upper abdomen. No acute fractures are seen. IMPRESSION: Cardiomegaly with central pulmonary vascular congestion. Electronically Signed   By: Tyron Gallon M.D.   On: 06/25/2023 15:43       Feliciana Horn M.D. [facility] 06/26/2023, 1:48 PM  Available via Epic secure chat 7am-7pm After 7 pm, please refer to night coverage provider listed on amion.

## 2023-06-26 NOTE — Plan of Care (Signed)

## 2023-06-26 NOTE — Care Management Obs Status (Signed)
 MEDICARE OBSERVATION STATUS NOTIFICATION   Patient Details  Name: [name] BRUEMMER, MD MRN: [id number] Date of Birth: 1929-04-04   Medicare Observation Status Notification Given:  Yes    Cindia Crease, RN 06/26/2023, 1:37 PM

## 2023-06-27 ENCOUNTER — Other Ambulatory Visit (HOSPITAL_COMMUNITY): Payer: Self-pay

## 2023-06-27 ENCOUNTER — Inpatient Hospital Stay (HOSPITAL_COMMUNITY)
Admission: RE | Admit: 2023-06-27 | Discharge: 2023-06-27 | Disposition: A | Discharge status: Home / Self Care | Source: Ambulatory Visit | Attending: Cardiology | Admitting: Cardiology

## 2023-06-27 DIAGNOSIS — W19XXXA Unspecified fall, initial encounter: Secondary | ICD-10-CM | POA: Diagnosis not present

## 2023-06-27 DIAGNOSIS — R42 Dizziness and giddiness: Secondary | ICD-10-CM | POA: Diagnosis not present

## 2023-06-27 DIAGNOSIS — Y92009 Unspecified place in unspecified non-institutional (private) residence as the place of occurrence of the external cause: Secondary | ICD-10-CM | POA: Diagnosis not present

## 2023-06-27 DIAGNOSIS — I5032 Chronic diastolic (congestive) heart failure: Secondary | ICD-10-CM

## 2023-06-27 DIAGNOSIS — T148XXA Other injury of unspecified body region, initial encounter: Secondary | ICD-10-CM | POA: Diagnosis not present

## 2023-06-27 DIAGNOSIS — I951 Orthostatic hypotension: Secondary | ICD-10-CM | POA: Diagnosis not present

## 2023-06-27 DIAGNOSIS — S0990XA Unspecified injury of head, initial encounter: Secondary | ICD-10-CM | POA: Diagnosis not present

## 2023-06-27 DIAGNOSIS — I480 Paroxysmal atrial fibrillation: Secondary | ICD-10-CM | POA: Diagnosis not present

## 2023-06-27 LAB — BASIC METABOLIC PANEL WITH GFR
Anion gap: 6 (ref 5–15)
BUN: 16 mg/dL (ref 8–23)
CO2: 25 mmol/L (ref 22–32)
Calcium: 8.3 mg/dL — ABNORMAL LOW (ref 8.9–10.3)
Chloride: 103 mmol/L (ref 98–111)
Creatinine, Ser: 0.96 mg/dL (ref 0.61–1.24)
GFR, Estimated: >60 mL/min (ref >60)
Glucose, Bld: 103 mg/dL — ABNORMAL HIGH (ref 70–99)
Potassium: 4.2 mmol/L (ref 3.5–5.1)
Sodium: 134 mmol/L — ABNORMAL LOW (ref 135–145)

## 2023-06-27 LAB — CBC WITH DIFFERENTIAL/PLATELET
Abs Immature Granulocytes: 0.04 10*3/uL (ref 0.00–0.07)
Basophils Absolute: 0.1 10*3/uL (ref 0.0–0.1)
Basophils Relative: 1 %
Eosinophils Absolute: 0.5 10*3/uL (ref 0.0–0.5)
Eosinophils Relative: 5 %
HCT: 27.6 % — ABNORMAL LOW (ref 39.0–52.0)
Hemoglobin: 8.8 g/dL — ABNORMAL LOW (ref 13.0–17.0)
Immature Granulocytes: 1 %
Lymphocytes Relative: 10 %
Lymphs Abs: 0.9 10*3/uL (ref 0.7–4.0)
MCH: 30.3 pg (ref 26.0–34.0)
MCHC: 31.9 g/dL (ref 30.0–36.0)
MCV: 95.2 fL (ref 80.0–100.0)
Monocytes Absolute: 0.9 10*3/uL (ref 0.1–1.0)
Monocytes Relative: 11 %
Neutro Abs: 6.4 10*3/uL (ref 1.7–7.7)
Neutrophils Relative %: 72 %
Platelets: 195 10*3/uL (ref 150–400)
RBC: 2.9 MIL/uL — ABNORMAL LOW (ref 4.22–5.81)
RDW: 14.5 % (ref 11.5–15.5)
WBC: 8.7 10*3/uL (ref 4.0–10.5)
nRBC: 0 % (ref 0.0–0.2)

## 2023-06-27 LAB — MAGNESIUM: Magnesium: 1.9 mg/dL (ref 1.7–2.4)

## 2023-06-27 MED ORDER — FUROSEMIDE 20 MG PO TABS: 20.0000 mg | ORAL_TABLET | Freq: Every day | ORAL | Status: DC | PRN

## 2023-06-27 MED ORDER — TRIPLE ANTIBIOTIC 3.5-400-5000 EX OINT
1.0000 | TOPICAL_OINTMENT | Freq: Every day | CUTANEOUS | 0 refills | Status: DC
  Filled 2023-06-27: qty 28.4, 28d supply, fill #0

## 2023-06-27 NOTE — TOC Transition Note (Signed)
 Transition of Care [facility]) - Discharge Note   Patient Details  Name: [name] BURKITT, MD MRN: [id number] Date of Birth: Apr 02, 1929  Transition of Care [facility]) CM/SW Contact:  Jannine Meo, RN Phone Number: 06/27/2023, 11:09 AM   Clinical Narrative:   secure message from virtual nurse that patient is possible discharge today. Pt with [facility] PT/OT recommendations. Confirmed patient is active with [facility] through [place]. Patient also with consult for palliative care. Provider aware of HH orders needed, if patient will go home with [facility] services.          Patient Goals and CMS Choice            Discharge Placement                       Discharge Plan and Services Additional resources added to the After Visit Summary for                                       Social Drivers of Health (SDOH) Interventions SDOH Screenings   Food Insecurity: No Food Insecurity (06/26/2023)  Housing: Low Risk  (06/26/2023)  Transportation Needs: No Transportation Needs (06/26/2023)  Utilities: Not At Risk (06/26/2023)  Alcohol  Screen: Low Risk  (06/23/2023)  Depression (PHQ2-9): Low Risk  (06/23/2023)  Financial Resource Strain: Low Risk  (06/23/2023)  Physical Activity: Sufficiently Active (06/23/2023)  Social Connections: Moderately Isolated (06/26/2023)  Stress: No Stress Concern Present (06/23/2023)  Tobacco Use: Low Risk  (06/25/2023)  Health Literacy: Adequate Health Literacy (06/23/2023)     Readmission Risk Interventions     No data to display

## 2023-06-27 NOTE — Plan of Care (Signed)

## 2023-06-27 NOTE — Progress Notes (Signed)
 Physical Therapy Treatment Patient Details Name: [name] HAAS, MD MRN: [id number] DOB: November 22, 1929 Today's Date: 06/27/2023   History of Present Illness Pt is a 89 y.o. male presenting 06/25/23 s/p fall in the bathroom where he hit his head. Admitted with syncope. Head and neck imaging were unremarkable. PMH HTN, pulmonary HTN, A-fib, hx of CVA, HF, CKD 3a, CAD, GERD, depression, HLD    PT Comments  Pt tolerated mobility progressions well, ambulating a greater distance than in prior session. Pt ambulates with AD and minimal physical assistance for occasional loss of balance. Pt had one report of dizziness while ambulating requiring a standing break, but notes it improves with time. Stair negotiation was discussed with patient and spouse. Pt's spouse reported nursing aid will be helping pt navigate stairs upon discharge. Pt was educated on the importance of hydration, donning abdominal binder and bilateral TEDS, and doing bed level lower extremity exercises prior to mobilizing, to aid in blood pressure management; pt and spouse verbalized understanding. PT will continue to treat pt while he is admitted. Recommending HHPT upon discharge to address remaining mobility deficits and optimize return to prior level of function.      If plan is discharge home, recommend the following: A little help with walking and/or transfers;A little help with bathing/dressing/bathroom;Assist for transportation;Help with stairs or ramp for entrance   Can travel by private vehicle        Equipment Recommendations  None recommended by PT    Recommendations for Other Services       Precautions / Restrictions Precautions Precautions: Fall Recall of Precautions/Restrictions: Intact Precaution/Restrictions Comments: Orthostatic BP Required Braces or Orthoses: Other Brace Other Brace: TEDs and abdominal binder Restrictions Weight Bearing Restrictions Per Provider Order: No     Mobility  Bed Mobility Overal bed  mobility: Modified Independent             General bed mobility comments: sup<>sit w/ HOB elevated    Transfers Overall transfer level: Needs assistance Equipment used: Rolling walker (2 wheels) Transfers: Sit to/from Stand Sit to Stand: Contact guard assist           General transfer comment: pt completed multiple sit<>stands throughout session from various heights w/ RW and CGA for safety. VC given for hand placement.    Ambulation/Gait Ambulation/Gait assistance: Contact guard assist, Min assist Gait Distance (Feet): 150 Feet Assistive device: Rolling walker (2 wheels) Gait Pattern/deviations: Step-through pattern, Decreased stride length, Trunk flexed, Narrow base of support, Drifts right/left, Staggering right Gait velocity: decreased Gait velocity interpretation: <1.8 ft/sec, indicate of risk for recurrent falls   General Gait Details: Pt demonstrates reciprocal gait strategy, R midfoot strike during stance phase, and increased hip and knee flexion throughout stance and swing phases. Pt had several instances of alteral losses of balance requiring min A for maintaing upright.   Stairs             Wheelchair Mobility     Tilt Bed    Modified Rankin (Stroke Patients Only)       Balance Overall balance assessment: Needs assistance Sitting-balance support: Feet supported Sitting balance-Leahy Scale: Good Sitting balance - Comments: Pt able to sit EOB for several minutes without  bilateral upper extremity support, shifting weight.   Standing balance support: Bilateral upper extremity supported, During functional activity, Reliant on assistive device for balance Standing balance-Leahy Scale: Poor Standing balance comment: pt is reliant on RW for maintaing upright  Communication Communication Communication: Impaired Factors Affecting Communication: Hearing impaired  Cognition Arousal: Alert Behavior During Therapy:  Impulsive   PT - Cognitive impairments: Safety/Judgement                       PT - Cognition Comments: pt is impulsive with mobilization, mobilizing quickly without prompting from therapist Following commands: Intact      Cueing Cueing Techniques: Verbal cues  Exercises      General Comments General comments (skin integrity, edema, etc.): BP seated: 141/66, BP standing: 107/upper 50s, after 3 mins standing BP: 110/58, end of session EOB BP: 100/58; bilateral TEDs and abdominal binder donned for all vital assessment and ambulation.      Pertinent Vitals/Pain Pain Assessment Pain Assessment: No/denies pain    Home Living                          Prior Function            PT Goals (current goals can now be found in the care plan section) Acute Rehab PT Goals Patient Stated Goal: to get better and prevent falls PT Goal Formulation: With patient Time For Goal Achievement: 07/10/23 Potential to Achieve Goals: Good Progress towards PT goals: Progressing toward goals    Frequency    Min 2X/week      PT Plan      Co-evaluation              AM-PAC PT "6 Clicks" Mobility   Outcome Measure  Help needed turning from your back to your side while in a flat bed without using bedrails?: None Help needed moving from lying on your back to sitting on the side of a flat bed without using bedrails?: None Help needed moving to and from a bed to a chair (including a wheelchair)?: A Little Help needed standing up from a chair using your arms (e.g., wheelchair or bedside chair)?: A Little Help needed to walk in hospital room?: A Little Help needed climbing 3-5 steps with a railing? : A Little 6 Click Score: 20    End of Session Equipment Utilized During Treatment: Gait belt Activity Tolerance: Patient tolerated treatment well Patient left: in bed;with call bell/phone within reach;with bed alarm set;with family/visitor present Nurse Communication:  Mobility status;Other (comment) (orthostatics) PT Visit Diagnosis: Unsteadiness on feet (R26.81);Other abnormalities of gait and mobility (R26.89);History of falling (Z91.81)     Time: 4098-1191 PT Time Calculation (min) (ACUTE ONLY): 25 min  Charges:    $Therapeutic Activity: 23-37 mins PT General Charges $$ ACUTE PT VISIT: 1 Visit                     Lonell Rives, SPT Acute Rehab [phone]    Lonell Rives 06/27/2023, 5:51 PM

## 2023-06-27 NOTE — Discharge Summary (Signed)
 Physician Discharge Summary   Patient: [name] NICHELSON, [name] [name] MRN: [id number] DOB: 04-28-1929  Admit date:     06/25/2023  Discharge date: 06/27/23  Discharge Physician: Feliciana Horn   PCP: Marquetta Sit, [name] [name]   Recommendations at discharge:  Please follow up with [name] as recommended.   Discharge Diagnoses: Principal Problem:   Dizziness Orthostatic hypotension.    Hospital Course: [name] Pickett, [name] [name] is a 89 y.o. male with past medical history  of  asthma, atrial fibrillation, heart failure, coronary artery disease, GERD, hypertension and hyperlipidemia who presented with fall and dizziness with history of syncope but no loss of consciousness today he did however hit his head while in the bathroom.  Patient was also admitted in March with similar presentation and has a  history of dizziness and lightheadedness and is on midodrine .  Wife at bedside reports that he wakes up in the middle of the night aleast 4 to 5 times a night and makes him more prone to falling . He was found to have atleast 8 cm skin tear on the right lower extremity and 3 cm skin tear on his scalp.  He is adamant about taking eliquis , despite bleeding from the sites of skin tears.   Assessment and Plan: Persistent dizziness/ lightheadedness:  Multiple falls recently.  Suspect from orthostatic hypotension and concomitant use of diuretics Changed lasix  to prn 20 g No arrhythmias overnight.  Continue with midodrine  15 mg TID.      Anemia of chronic disease:  Hemoglobin appears to be at baseline around 9.        PAF/ h/o stroke.  Continue with eliquis .        Chronic intermittent diarrhea.  Continue with Loperamide .        Aki on stage 3 a CKD Resolved. Creatinine at baseline.       CAD Stable.      Chronic diastolic heart failure -appears euvolemic.      Estimated body mass index is 20.95 kg/m as calculated from the following:   Height as of this encounter: 5\' 10"  (1.778 m).   Weight  as of this encounter: 66.2 kg.   Consultants: cardiology Procedures performed: none.   Disposition: Hospice care Diet recommendation:  Discharge Diet Orders (From admission, onward)     Start     Ordered   06/27/23 0000  Diet - low sodium heart healthy        06/27/23 1435           Regular diet DISCHARGE MEDICATION: Allergies as of 06/27/2023       Reactions   Tylenol  [acetaminophen ] Rash   Lactose Intolerance (gi) Diarrhea, Nausea Only   Levaquin [levofloxacin]    Triggered A-fib    Advil [ibuprofen] Nausea Only, Rash   Asa [aspirin ] Rash        Medication List     TAKE these medications    apixaban  5 MG Tabs tablet Commonly known as: ELIQUIS  Take 1 tablet (5 mg total) by mouth 2 (two) times daily.   Arnuity Ellipta  200 MCG/ACT Aepb Generic drug: Fluticasone  Furoate Inhale 1 puff into the lungs daily. What changed: when to take this   atorvastatin  20 MG tablet Commonly known as: LIPITOR TAKE 1 TABLET BY MOUTH EVERY DAY What changed:  how much to take how to take this when to take this additional instructions   buPROPion  150 MG 12 hr tablet Commonly known as: WELLBUTRIN  SR TAKE 1 TABLET(150 MG) BY MOUTH DAILY  cetirizine 10 MG tablet Commonly known as: ZYRTEC Take 10 mg by mouth daily.   denosumab  60 MG/ML Sosy injection Commonly known as: PROLIA  Inject 60 mg into the skin every 6 (six) months.   dofetilide  250 MCG capsule Commonly known as: TIKOSYN  TAKE 1 CAPSULE(250 MCG) BY MOUTH TWICE DAILY   FLUoxetine  10 MG capsule Commonly known as: PROZAC  Take 1 capsule (10 mg total) by mouth daily.   furosemide  20 MG tablet Commonly known as: LASIX  Take 1 tablet (20 mg total) by mouth daily as needed for fluid or edema. Take one tablet by mouth as needed for +2 ankle edema.   LACTAID PO Take 1 tablet by mouth 3 (three) times daily with meals as needed (lactose intolerance).   loperamide  2 MG capsule Commonly known as: IMODIUM  Take 2  capsules (4 mg total) by mouth 3 (three) times daily as needed for diarrhea or loose stools.   LORazepam  1 MG tablet Commonly known as: ATIVAN  TAKE 1/2 TO 1 TABLET(0.5 TO 1 MG) BY MOUTH AT BEDTIME AS NEEDED What changed: See the new instructions.   midodrine  10 MG tablet Commonly known as: PROAMATINE  Take 1.5 tablets (15 mg total) by mouth 3 (three) times daily with meals.   omeprazole  20 MG tablet Commonly known as: PRILOSEC  OTC Take 1 tablet (20 mg total) by mouth daily.   polymixin-bacitracin  500-10000 UNIT/GM Oint ointment Apply 1 Application topically daily at 2 am. Start taking on: Jun 28, 2023   SYSTANE OP Place 1 drop into both eyes 4 (four) times daily as needed (dry eye).   tamsulosin  0.4 MG Caps capsule Commonly known as: FLOMAX  TAKE 1 CAPSULE(0.4 MG) BY MOUTH DAILY What changed: See the new instructions.   traMADol  50 MG tablet Commonly known as: ULTRAM  Take 1 tablet (50 mg total) by mouth every 6 (six) hours as needed.   VITAMIN D -3 PO Take 1 capsule by mouth at bedtime.               Discharge Care Instructions  (From admission, onward)           Start     Ordered   06/27/23 0000  Discharge wound care:       Comments: Cleanse skin tears with saline, pat dry Cover with single layer of xerform, top LE wounds with foam.  Cover scalp wounds with foam if will stick, if not use antibiotic ointment only  06/26/23 1414   06/27/23 1435            Follow-up Information     [facility] Follow up.   Specialty: Home Health Services Why: Physical and occupational therapy services. Office will call to arrange follow up after hospital discharge. Contact information: [address] [place] [address] [phone]         Marquetta Sit, [name] [name]. Schedule an appointment as soon as possible for a visit in 1 week(s).   Specialty: Family Medicine Contact information: [address] [place]  [address] [phone]                Discharge Exam: Filed Weights   06/25/23 1433 06/27/23 0500  Weight: 66.2 kg 68.2 kg   General exam: Appears calm and comfortable  Respiratory system: Clear to auscultation. Respiratory effort normal. Cardiovascular system: S1 & S2 heard, RRR.  Gastrointestinal system: Abdomen is nondistended, soft and nontender.  Central nervous system: Alert and oriented.  Extremities: Symmetric 5 x 5 power.  Skin: multiple skin tears on the legs and scalp bandaged.  Psychiatry:  Mood & affect appropriate.    Condition at discharge: fair  The results of significant diagnostics from this hospitalization (including imaging, microbiology, ancillary and laboratory) are listed below for reference.   Imaging Studies: CT Cervical Spine Wo Contrast Result Date: 06/25/2023 CLINICAL DATA:  Hypotension, fell EXAM: CT CERVICAL SPINE WITHOUT CONTRAST TECHNIQUE: Multidetector CT imaging of the cervical spine was performed without intravenous contrast. Multiplanar CT image reconstructions were also generated. RADIATION DOSE REDUCTION: This exam was performed according to the departmental dose-optimization program which includes automated exposure control, adjustment of the mA and/or kV according to patient size and/or use of iterative reconstruction technique. COMPARISON:  05/12/2023 FINDINGS: Alignment: Grossly normal anatomic alignment. Skull base and vertebrae: No acute fracture. No primary bone lesion or focal pathologic process. Soft tissues and spinal canal: No prevertebral fluid or swelling. No visible canal hematoma. Disc levels: Stable multilevel spondylosis most pronounced at C5-6. Prominent facet hypertrophic changes, greatest on the left at C3-4, C4-5, and C5-6. Upper chest: Airway is patent.  Lung apices are clear. Other: Reconstructed images demonstrate no additional findings. IMPRESSION: 1. No acute cervical spine fracture. 2. Stable multilevel cervical degenerative  changes. Electronically Signed   By: Bobbye Burrow M.D.   On: 06/25/2023 16:36   CT Head Wo Contrast Result Date: 06/25/2023 CLINICAL DATA:  Hypotension, fell, anticoagulated EXAM: CT HEAD WITHOUT CONTRAST TECHNIQUE: Contiguous axial images were obtained from the base of the skull through the vertex without intravenous contrast. RADIATION DOSE REDUCTION: This exam was performed according to the departmental dose-optimization program which includes automated exposure control, adjustment of the mA and/or kV according to patient size and/or use of iterative reconstruction technique. COMPARISON:  05/12/2023 FINDINGS: Brain: Stable generalized cortical atrophy. Chronic cortical infarcts again noted within the right frontal, right parietal, left occipital regions. Stable chronic small vessel ischemic changes within the periventricular white matter. No evidence of acute infarct or hemorrhage. Lateral ventricles and remaining midline structures are unremarkable. No acute extra-axial fluid collections. No mass effect. Vascular: No hyperdense vessel or unexpected calcification. Skull: Right frontal scalp laceration. Negative for fracture or focal lesion. Sinuses/Orbits: Near complete opacification of the right frontal, bilateral ethmoid, right sphenoid, and bilateral maxillary sinuses. Gas fluid levels are seen within the right sphenoid and bilateral maxillary sinuses. Other: None. IMPRESSION: 1. Small right frontal scalp laceration. No underlying fracture or acute intracranial trauma. 2. Stable chronic ischemic changes as above. 3. Acute on chronic pansinus disease. Electronically Signed   By: Bobbye Burrow M.D.   On: 06/25/2023 16:34   DG Chest Portable 1 View Result Date: 06/25/2023 CLINICAL DATA:  Heart failure EXAM: PORTABLE CHEST 1 VIEW COMPARISON:  Chest x-ray 05/12/2023 FINDINGS: The heart is enlarged. The aorta is ectatic. There is central pulmonary vascular congestion. There is no focal lung infiltrate,  pleural effusion or pneumothorax. There are surgical clips in the upper abdomen. No acute fractures are seen. IMPRESSION: Cardiomegaly with central pulmonary vascular congestion. Electronically Signed   By: Tyron Gallon M.D.   On: 06/25/2023 15:43    Microbiology: Results for orders placed or performed during the hospital encounter of 04/13/22  Blood culture (routine x 2)     Status: None   Collection Time: 04/13/22 11:01 AM   Specimen: BLOOD  Result Value Ref Range Status   Specimen Description BLOOD SITE NOT SPECIFIED  Final   Special Requests   Final    BOTTLES DRAWN AEROBIC AND ANAEROBIC  Blood Culture adequate volume   Culture   Final    NO GROWTH 5 DAYS Performed at [facility], [address], [place], [place] [address]    Report Status 04/18/2022 FINAL  Final  Blood culture (routine x 2)     Status: None   Collection Time: 04/13/22  1:38 PM   Specimen: BLOOD  Result Value Ref Range Status   Specimen Description BLOOD LEFT ANTECUBITAL  Final   Special Requests   Final    BOTTLES DRAWN AEROBIC AND ANAEROBIC Blood Culture adequate volume   Culture   Final    NO GROWTH 5 DAYS Performed at [facility], [address] [place], [place] [address]    Report Status 04/18/2022 FINAL  Final  Respiratory (~20 pathogens) panel by PCR     Status: None   Collection Time: 04/14/22 11:00 AM   Specimen: Nasopharyngeal Swab; Respiratory  Result Value Ref Range Status   Adenovirus NOT DETECTED NOT DETECTED Final   Coronavirus 229E NOT DETECTED NOT DETECTED Final    Comment: (NOTE) The Coronavirus on the Respiratory Panel, DOES NOT test for the novel  Coronavirus (2019 nCoV)    Coronavirus HKU1 NOT DETECTED NOT DETECTED Final   Coronavirus NL63 NOT DETECTED NOT DETECTED Final   Coronavirus OC43 NOT DETECTED NOT DETECTED Final   Metapneumovirus NOT DETECTED NOT DETECTED Final   Rhinovirus / Enterovirus NOT DETECTED NOT DETECTED Final   Influenza A NOT DETECTED NOT DETECTED  Final   Influenza B NOT DETECTED NOT DETECTED Final   Parainfluenza Virus 1 NOT DETECTED NOT DETECTED Final   Parainfluenza Virus 2 NOT DETECTED NOT DETECTED Final   Parainfluenza Virus 3 NOT DETECTED NOT DETECTED Final   Parainfluenza Virus 4 NOT DETECTED NOT DETECTED Final   Respiratory Syncytial Virus NOT DETECTED NOT DETECTED Final   Bordetella pertussis NOT DETECTED NOT DETECTED Final   Bordetella Parapertussis NOT DETECTED NOT DETECTED Final   Chlamydophila pneumoniae NOT DETECTED NOT DETECTED Final   Mycoplasma pneumoniae NOT DETECTED NOT DETECTED Final    Comment: Performed at [facility], [address] [place], [place] [address]    Labs: CBC: Recent Labs  Lab 06/25/23 1514 06/26/23 0806 06/27/23 1022  WBC 10.8* 10.1 8.7  NEUTROABS 8.4*  --  6.4  HGB 9.0* 8.8* 8.8*  HCT 27.8* 27.5* 27.6*  MCV 95.2 94.2 95.2  PLT 221 196 195   Basic Metabolic Panel: Recent Labs  Lab 06/25/23 1514 06/25/23 1741 06/26/23 0806 06/27/23 1022  NA 135  --  134* 134*  K 4.2  --  4.0 4.2  CL 104  --  106 103  CO2 22  --  22 25  GLUCOSE 121*  --  108* 103*  BUN 27*  --  20 16  CREATININE 1.25*  --  1.04 0.96  CALCIUM  8.4*  --  8.2* 8.3*  MG  --  1.7 2.7* 1.9   Liver Function Tests: Recent Labs  Lab 06/25/23 1514 06/26/23 0806  AST 17 14*  ALT 12 12  ALKPHOS 69 69  BILITOT 0.6 0.7  PROT 5.4* 5.5*  ALBUMIN 2.9* 2.9*   CBG: No results for input(s): "GLUCAP" in the last 168 hours.  Discharge time spent: 42 minutes.   Signed: Feliciana Horn, [name] [name] [facility] 06/27/2023

## 2023-06-28 ENCOUNTER — Other Ambulatory Visit (HOSPITAL_COMMUNITY): Payer: Self-pay

## 2023-06-28 NOTE — Plan of Care (Signed)
  Problem: Education: Goal: Knowledge of General Education information will improve Description: Including pain rating scale, medication(s)/side effects and non-pharmacologic comfort measures Outcome: Progressing   Problem: Nutrition: Goal: Adequate nutrition will be maintained Outcome: Progressing   Problem: Activity: Goal: Risk for activity intolerance will decrease Outcome: Progressing   Problem: Elimination: Goal: Will not experience complications related to bowel motility Outcome: Progressing Goal: Will not experience complications related to urinary retention Outcome: Progressing   Problem: Safety: Goal: Ability to remain free from injury will improve Outcome: Progressing   Problem: Skin Integrity: Goal: Risk for impaired skin integrity will decrease Outcome: Progressing

## 2023-06-28 NOTE — TOC Transition Note (Signed)
 Transition of Care [facility]) - Discharge Note   Patient Details  Name: [name] BRANDIS, MD MRN: [id number] Date of Birth: Oct 22, 1929  Transition of Care [facility]) CM/SW Contact:  Omie Bickers, RN Phone Number: 06/28/2023, 8:38 AM   Clinical Narrative:     [facility] that patient has DC order and will resume HH services.         Patient Goals and CMS Choice            Discharge Placement                       Discharge Plan and Services Additional resources added to the After Visit Summary for                                       Social Drivers of Health (SDOH) Interventions SDOH Screenings   Food Insecurity: No Food Insecurity (06/26/2023)  Housing: Low Risk  (06/26/2023)  Transportation Needs: No Transportation Needs (06/26/2023)  Utilities: Not At Risk (06/26/2023)  Alcohol  Screen: Low Risk  (06/23/2023)  Depression (PHQ2-9): Low Risk  (06/23/2023)  Financial Resource Strain: Low Risk  (06/23/2023)  Physical Activity: Sufficiently Active (06/23/2023)  Social Connections: Moderately Isolated (06/26/2023)  Stress: No Stress Concern Present (06/23/2023)  Tobacco Use: Low Risk  (06/25/2023)  Health Literacy: Adequate Health Literacy (06/23/2023)     Readmission Risk Interventions     No data to display

## 2023-06-30 ENCOUNTER — Telehealth: Payer: Self-pay

## 2023-06-30 NOTE — Transitions of Care (Post Inpatient/ED Visit) (Signed)
 06/30/2023  Name: [name] HARTSHORN, MD MRN: [id number] DOB: [birth year]  Today's TOC FU Call Status: Today's TOC FU Call Status:: Successful TOC FU Call Completed TOC FU Call Complete Date: 06/30/23 Patient's Name and Date of Birth confirmed.  Transition Care Management Follow-up Telephone Call Date of Discharge: 06/28/23 Discharge Facility: [facility]) Type of Discharge: Inpatient Admission Primary Inpatient Discharge Diagnosis:: Fall How have you been since you were released from the hospital?: Better Any questions or concerns?: No  Items Reviewed: Did you receive and understand the discharge instructions provided?: Yes Medications obtained,verified, and reconciled?: Yes (Medications Reviewed) Any new allergies since your discharge?: No Dietary orders reviewed?: No Do you have support at home?: Yes People in Home [RPT]: spouse Name of Support/Comfort Primary Source: Lavonne Vittitow  Medications Reviewed Today: Medications Reviewed Today     Reviewed by Willie Harry, RN (Registered Nurse) on 06/30/23 at 1112  Med List Status: <None>   Medication Order Taking? Sig Documenting Provider Last Dose Status Informant  apixaban  (ELIQUIS ) 5 MG TABS tablet [id number] No Take 1 tablet (5 mg total) by mouth 2 (two) times daily. Darlis Eisenmenger, MD 06/25/2023  9:30 AM Active Self, Pharmacy Records  Patient taking differently:  Discontinued 06/30/23 1112 (Change in therapy) buPROPion  (WELLBUTRIN  SR) 150 MG 12 hr tablet [id number] No TAKE 1 TABLET(150 MG) BY MOUTH DAILY Marquetta Sit, MD 06/25/2023 Morning Active Self, Pharmacy Records  cetirizine (ZYRTEC) 10 MG tablet [id number] No Take 10 mg by mouth daily. [provider] 06/25/2023 Morning Active Self, Pharmacy Records  Cholecalciferol  (VITAMIN D -3 PO) [id number] No Take 1 capsule by mouth at bedtime. [provider] 06/24/2023 Bedtime Active Self, Pharmacy Records  denosumab  (PROLIA ) 60 MG/ML SOSY injection [id number]  No Inject 60 mg into the skin every 6 (six) months. [provider] Unknown Active Self, Pharmacy Records  dofetilide  (TIKOSYN ) 250 MCG capsule [id number] No TAKE 1 CAPSULE(250 MCG) BY MOUTH TWICE DAILY Darlis Eisenmenger, MD 06/25/2023  9:30 AM Active Self, Pharmacy Records  FLUoxetine  (PROZAC ) 10 MG capsule [id number] No Take 1 capsule (10 mg total) by mouth daily. Marquetta Sit, MD 06/25/2023 Morning Active Self, Pharmacy Records  Fluticasone  Furoate (ARNUITY ELLIPTA ) 200 MCG/ACT AEPB [id number] No Inhale 1 puff into the lungs daily.  Patient taking differently: Inhale 1 puff into the lungs at bedtime.   Marquetta Sit, MD 06/24/2023 Bedtime Active Self, Pharmacy Records  furosemide  (LASIX ) 20 MG tablet [id number]  Take 1 tablet (20 mg total) by mouth daily as needed for fluid or edema. Take one tablet by mouth as needed for +2 ankle edema. Feliciana Horn, MD  Active   Lactase (LACTAID PO) [id number] No Take 1 tablet by mouth 3 (three) times daily with meals as needed (lactose intolerance). [provider] Past Month Active Self, Pharmacy Records           Med Note Merlyn Starring, NICOLE   Sat Jun 25, 2023  9:18 PM) Only when eating ice cream  loperamide  (IMODIUM ) 2 MG capsule [id number] No Take 2 capsules (4 mg total) by mouth 3 (three) times daily as needed for diarrhea or loose stools. Maylene Spear, MD Past Week Active Self, Pharmacy Records  LORazepam  (ATIVAN ) 1 MG tablet [id number] No TAKE 1/2 TO 1 TABLET(0.5 TO 1 MG) BY MOUTH AT BEDTIME AS NEEDED  Patient taking differently: Take 0.5-1 mg by mouth at bedtime.   Marquetta Sit, MD 06/24/2023 Bedtime Active Self, Pharmacy Records  Med Note Loistine Rinne, Eustacio Highman   Thu May 19, 2023  1:44 PM)    midodrine  (PROAMATINE ) 10 MG tablet [id number] No Take 1.5 tablets (15 mg total) by mouth 3 (three) times daily with meals. Darlis Eisenmenger, MD 06/25/2023 Noon Active Self, Pharmacy Records  neomycin -bacitracin -polymyxin [phone] OINT  [id number]  Apply 1 Application topically daily at 2 am. Akula, Vijaya, MD  Active   omeprazole  (PRILOSEC  OTC) 20 MG tablet [id number] No Take 1 tablet (20 mg total) by mouth daily. Marybelle Smiling, MD 06/25/2023 Morning Active Self, Pharmacy Records  Polyethyl Glycol-Propyl Glycol (SYSTANE OP) [id number] No Place 1 drop into both eyes 4 (four) times daily as needed (dry eye). [provider] Past Month Active Self, Pharmacy Records  Patient taking differently:  Discontinued 06/30/23 1112 (Change in therapy) traMADol  (ULTRAM ) 50 MG tablet [id number] No Take 1 tablet (50 mg total) by mouth every 6 (six) hours as needed. Marquetta Sit, MD Past Month Active Self, Pharmacy Records            Home Care and Equipment/Supplies: Were Home Health Services Ordered?: Yes Name of Home Health Agency:: [facility] Has Agency set up a time to come to your home?: Yes First Home Health Visit Date: 06/29/23 Any new equipment or medical supplies ordered?: No  Functional Questionnaire: Do you need assistance with bathing/showering or dressing?: Yes Do you need assistance with meal preparation?: Yes Do you need assistance with eating?: Yes Do you have difficulty maintaining continence: Yes Do you need assistance with getting out of bed/getting out of a chair/moving?: Yes Do you have difficulty managing or taking your medications?: Yes  Follow up appointments reviewed: PCP Follow-up appointment confirmed?: No MD Provider Line Number:[phone] Given: Yes Specialist Hospital Follow-up appointment confirmed?: NA Do you need transportation to your follow-up appointment?: Yes Transportation Need Intervention Addressed By:: Provider Office Notified Do you understand care options if your condition(s) worsen?: Yes-patient verbalized understanding    SIGNATURE: Horatio Lynch, BSN, Technical brewer

## 2023-07-05 ENCOUNTER — Other Ambulatory Visit: Payer: Self-pay | Admitting: Family Medicine

## 2023-07-15 ENCOUNTER — Other Ambulatory Visit: Payer: Self-pay | Admitting: Family Medicine

## 2023-07-20 NOTE — Progress Notes (Signed)
  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   Admitted 5/17-5/19 with dizziness and hypotension. Lasix  changed to daily. No arrhythmia noted.   Since discharge from the hospital, he has been feeling OK. Taking midodrine  TID, with the third dose at dinner. Chronic stable lightheadedness otherwise. Hasn't missed any doses of Tikosyn , or felt breakthrough AF. Mild SOB with most activity.     Review of systems complete and found to be negative unless listed in HPI.   EP Information / Studies Reviewed:    EKG is not ordered today. EKG from 06/25/2023 reviewed which showed NSR at 78 bpm with stable QT interval  EKG Interpretation Date/Time:  Friday July 22 2023 11:32:50 EDT Ventricular Rate:  70 PR Interval:  160 QRS Duration:  82 QT Interval:  442 QTC Calculation: 477 R Axis:   46  Text Interpretation: Normal sinus rhythm Nonspecific ST and T wave abnormality When compared with ECG of 25-Jun-2023 14:38, PREVIOUS ECG IS PRESENT Confirmed by Pilar Bridge [phone]) on 07/22/2023 11:34:16 AM    Arrhythmia/Device History HF MD - Mitzie Anda   Physical Exam:   VS:  Ht 5' 10 (1.778 m)   Wt 133 lb (60.3 kg)   BMI 19.08 kg/m    Wt Readings from Last 3 Encounters:  07/22/23 133 lb (60.3 kg)  06/27/23 150 lb 5.7 oz (68.2 kg)  05/19/23 149 lb 9.6 oz (67.9 kg)     GEN: No acute distress NECK: No JVD; No carotid bruits CARDIAC: Regular rate and rhythm, no murmurs, rubs, gallops RESPIRATORY:  Clear to auscultation without rales, wheezing or rhonchi  ABDOMEN: Soft, non-tender, non-distended EXTREMITIES:  No edema; No deformity   ASSESSMENT AND PLAN:     Paroxysmal AF Recent EKG with stable intervals Continue tikosyn  250 mcg BID. CrCl 41.87. He is borderline for dose reduction.  Labs stable.  Continue Eliquis  5 mg BID with CHA2DS2/VASc of at least 7. He if has further persistent AF, would likely lean towards rate control alone.  Worry that amiodarone would cause more off target effects than benefit.   CAD No s/s of ischemia.      Chronic diastolic CHF Echo 05/06/2023 LVEF 60-65% Volume status OK on exam.  Persistent dizziness and lightheadedness  Continue midodrine ; Discussed timing to cover waking hours only Lasix  per HF team Discussed the importance of compression wear, specifically thigh or abdominal compression. This can be accomplished separately or with combined thigh/pelvic/abdominal compression wear such as Spanx.       Follow up with EP Team in 6 months  Signed, Tylene Galla, PA-C

## 2023-07-22 ENCOUNTER — Encounter: Payer: Self-pay | Admitting: Student

## 2023-07-22 ENCOUNTER — Ambulatory Visit: Attending: Student | Admitting: Student

## 2023-07-22 VITALS — BP 164/67 | HR 70 | Ht 70.0 in | Wt 133.0 lb

## 2023-07-22 DIAGNOSIS — I2510 Atherosclerotic heart disease of native coronary artery without angina pectoris: Secondary | ICD-10-CM

## 2023-07-22 DIAGNOSIS — I480 Paroxysmal atrial fibrillation: Secondary | ICD-10-CM

## 2023-07-22 DIAGNOSIS — I951 Orthostatic hypotension: Secondary | ICD-10-CM

## 2023-07-22 DIAGNOSIS — I5032 Chronic diastolic (congestive) heart failure: Secondary | ICD-10-CM

## 2023-07-22 NOTE — Patient Instructions (Addendum)
 Medication Instructions:  No medication changes today. *If you need a refill on your cardiac medications before your next appointment, please call your pharmacy*  Lab Work: No labwork ordered today. If you have labs (blood work) drawn today and your tests are completely normal, you will receive your results only by: MyChart Message (if you have MyChart) OR A paper copy in the mail If you have any lab test that is abnormal or we need to change your treatment, we will call you to review the results.  Testing/Procedures: No testing ordered today  Follow-Up: At [facility], you and your health needs are our priority.  As part of our continuing mission to provide you with exceptional heart care, our providers are all part of one team.  This team includes your primary Cardiologist (physician) and Advanced Practice Providers or APPs (Physician Assistants and Nurse Practitioners) who all work together to provide you with the care you need, when you need it.  Your next appointment:   6 month(s)  Provider:   You may see Manya Sells, MD or one of the following Advanced Practice Providers on your designated Care Team:   Mertha Abrahams, PA-C Michael Andy Tillery, PA-C Suzann Riddle, NP Creighton Doffing, NP    Schedule follow up with Dr Mitzie Anda  We recommend signing up for the patient portal called MyChart.  Sign up information is provided on this After Visit Summary.  MyChart is used to connect with patients for Virtual Visits (Telemedicine).  Patients are able to view lab/test results, encounter notes, upcoming appointments, etc.  Non-urgent messages can be sent to your provider as well.   To learn more about what you can do with MyChart, go to [email or web address].

## 2023-08-13 ENCOUNTER — Other Ambulatory Visit: Payer: Self-pay | Admitting: Family Medicine

## 2023-08-17 ENCOUNTER — Other Ambulatory Visit (HOSPITAL_COMMUNITY): Payer: Self-pay | Admitting: Cardiology

## 2023-08-17 MED ORDER — DOFETILIDE 250 MCG PO CAPS: 250.0000 ug | ORAL_CAPSULE | Freq: Two times a day (BID) | ORAL | 1 refills | Status: DC

## 2023-09-09 DEATH — deceased

## 2023-10-19 ENCOUNTER — Ambulatory Visit (HOSPITAL_COMMUNITY): Admitting: Cardiology
